# Patient Record
Sex: Male | Born: 1935 | Race: White | Hispanic: No | Marital: Married | Smoking: Former smoker
Health system: Southern US, Community
[De-identification: ages and names within clinical notes are randomized; demographics above are authoritative.]

## PROBLEM LIST (undated history)

## (undated) DIAGNOSIS — E785 Hyperlipidemia, unspecified: Secondary | ICD-10-CM

## (undated) DIAGNOSIS — L02215 Cutaneous abscess of perineum: Secondary | ICD-10-CM

## (undated) DIAGNOSIS — G4730 Sleep apnea, unspecified: Secondary | ICD-10-CM

## (undated) DIAGNOSIS — K2210 Ulcer of esophagus without bleeding: Secondary | ICD-10-CM

## (undated) DIAGNOSIS — J439 Emphysema, unspecified: Secondary | ICD-10-CM

## (undated) DIAGNOSIS — R7303 Prediabetes: Secondary | ICD-10-CM

## (undated) DIAGNOSIS — C4491 Basal cell carcinoma of skin, unspecified: Secondary | ICD-10-CM

## (undated) DIAGNOSIS — Z950 Presence of cardiac pacemaker: Secondary | ICD-10-CM

## (undated) DIAGNOSIS — I10 Essential (primary) hypertension: Secondary | ICD-10-CM

## (undated) DIAGNOSIS — T8859XA Other complications of anesthesia, initial encounter: Secondary | ICD-10-CM

## (undated) DIAGNOSIS — R739 Hyperglycemia, unspecified: Secondary | ICD-10-CM

## (undated) DIAGNOSIS — J189 Pneumonia, unspecified organism: Secondary | ICD-10-CM

## (undated) DIAGNOSIS — N189 Chronic kidney disease, unspecified: Secondary | ICD-10-CM

## (undated) DIAGNOSIS — I4891 Unspecified atrial fibrillation: Secondary | ICD-10-CM

## (undated) DIAGNOSIS — I4892 Unspecified atrial flutter: Secondary | ICD-10-CM

## (undated) DIAGNOSIS — I499 Cardiac arrhythmia, unspecified: Secondary | ICD-10-CM

## (undated) DIAGNOSIS — J449 Chronic obstructive pulmonary disease, unspecified: Secondary | ICD-10-CM

## (undated) DIAGNOSIS — M259 Joint disorder, unspecified: Secondary | ICD-10-CM

## (undated) DIAGNOSIS — F329 Major depressive disorder, single episode, unspecified: Secondary | ICD-10-CM

## (undated) DIAGNOSIS — K5790 Diverticulosis of intestine, part unspecified, without perforation or abscess without bleeding: Secondary | ICD-10-CM

## (undated) DIAGNOSIS — K222 Esophageal obstruction: Secondary | ICD-10-CM

## (undated) DIAGNOSIS — M1990 Unspecified osteoarthritis, unspecified site: Secondary | ICD-10-CM

## (undated) DIAGNOSIS — J45909 Unspecified asthma, uncomplicated: Secondary | ICD-10-CM

## (undated) DIAGNOSIS — K449 Diaphragmatic hernia without obstruction or gangrene: Secondary | ICD-10-CM

## (undated) DIAGNOSIS — K5730 Diverticulosis of large intestine without perforation or abscess without bleeding: Secondary | ICD-10-CM

## (undated) DIAGNOSIS — H3530 Unspecified macular degeneration: Secondary | ICD-10-CM

## (undated) DIAGNOSIS — E119 Type 2 diabetes mellitus without complications: Secondary | ICD-10-CM

## (undated) DIAGNOSIS — Z87442 Personal history of urinary calculi: Secondary | ICD-10-CM

## (undated) DIAGNOSIS — F32A Depression, unspecified: Secondary | ICD-10-CM

## (undated) HISTORY — DX: Major depressive disorder, single episode, unspecified: F32.9

## (undated) HISTORY — DX: Hyperglycemia, unspecified: R73.9

## (undated) HISTORY — DX: Unspecified macular degeneration: H35.30

## (undated) HISTORY — DX: Cutaneous abscess of perineum: L02.215

## (undated) HISTORY — DX: Type 2 diabetes mellitus without complications: E11.9

## (undated) HISTORY — PX: INSERT / REPLACE / REMOVE PACEMAKER: SUR710

## (undated) HISTORY — DX: Esophageal obstruction: K22.2

## (undated) HISTORY — DX: Pneumonia, unspecified organism: J18.9

## (undated) HISTORY — PX: NOSE SURGERY: SHX723

## (undated) HISTORY — DX: Emphysema, unspecified: J43.9

## (undated) HISTORY — DX: Ulcer of esophagus without bleeding: K22.10

## (undated) HISTORY — DX: Diaphragmatic hernia without obstruction or gangrene: K44.9

## (undated) HISTORY — DX: Chronic obstructive pulmonary disease, unspecified: J44.9

## (undated) HISTORY — DX: Unspecified atrial flutter: I48.92

## (undated) HISTORY — DX: Joint disorder, unspecified: M25.9

## (undated) HISTORY — DX: Depression, unspecified: F32.A

## (undated) HISTORY — DX: Diverticulosis of intestine, part unspecified, without perforation or abscess without bleeding: K57.90

## (undated) HISTORY — DX: Essential (primary) hypertension: I10

## (undated) HISTORY — DX: Basal cell carcinoma of skin, unspecified: C44.91

## (undated) HISTORY — DX: Unspecified asthma, uncomplicated: J45.909

## (undated) HISTORY — DX: Unspecified atrial fibrillation: I48.91

## (undated) HISTORY — PX: CARDIAC ELECTROPHYSIOLOGY STUDY AND ABLATION: SHX1294

## (undated) HISTORY — DX: Hyperlipidemia, unspecified: E78.5

## (undated) HISTORY — DX: Unspecified osteoarthritis, unspecified site: M19.90

## (undated) HISTORY — DX: Sleep apnea, unspecified: G47.30

## (undated) HISTORY — PX: CYST EXCISION PERINEAL: SHX6278

## (undated) HISTORY — DX: Diverticulosis of large intestine without perforation or abscess without bleeding: K57.30

---

## 1969-06-12 HISTORY — PX: VASECTOMY: SHX75

## 1979-06-13 HISTORY — PX: UVULOPALATOPHARYNGOPLASTY: SHX827

## 1995-05-13 ENCOUNTER — Encounter: Payer: Self-pay | Admitting: Family Medicine

## 1995-05-13 LAB — CONVERTED CEMR LAB: PSA: 1 ng/mL

## 1998-11-19 ENCOUNTER — Encounter: Payer: Self-pay | Admitting: Emergency Medicine

## 1998-11-19 ENCOUNTER — Emergency Department (HOSPITAL_COMMUNITY): Admission: EM | Admit: 1998-11-19 | Discharge: 1998-11-19 | Payer: Self-pay | Admitting: Emergency Medicine

## 1998-12-11 ENCOUNTER — Encounter: Payer: Self-pay | Admitting: Family Medicine

## 1998-12-11 LAB — CONVERTED CEMR LAB: PSA: 1.3 ng/mL

## 2000-10-27 ENCOUNTER — Encounter (INDEPENDENT_AMBULATORY_CARE_PROVIDER_SITE_OTHER): Payer: Self-pay | Admitting: *Deleted

## 2000-10-27 ENCOUNTER — Encounter: Payer: Self-pay | Admitting: Gastroenterology

## 2000-10-27 ENCOUNTER — Ambulatory Visit (HOSPITAL_COMMUNITY): Admission: RE | Admit: 2000-10-27 | Discharge: 2000-10-27 | Payer: Self-pay | Admitting: Internal Medicine

## 2000-12-10 LAB — HM COLONOSCOPY

## 2002-09-11 ENCOUNTER — Encounter: Payer: Self-pay | Admitting: Family Medicine

## 2002-09-11 LAB — CONVERTED CEMR LAB: PSA: 0.9 ng/mL

## 2004-08-12 ENCOUNTER — Encounter: Payer: Self-pay | Admitting: Family Medicine

## 2004-08-12 LAB — CONVERTED CEMR LAB
PSA: 0.91 ng/mL
PSA: 0.91 ng/mL

## 2004-08-18 ENCOUNTER — Ambulatory Visit: Payer: Self-pay

## 2004-08-21 ENCOUNTER — Ambulatory Visit: Payer: Self-pay | Admitting: Family Medicine

## 2004-08-26 ENCOUNTER — Ambulatory Visit: Payer: Self-pay | Admitting: Family Medicine

## 2004-09-10 ENCOUNTER — Ambulatory Visit: Payer: Self-pay | Admitting: Family Medicine

## 2004-09-15 ENCOUNTER — Ambulatory Visit: Payer: Self-pay | Admitting: Cardiology

## 2004-09-19 ENCOUNTER — Ambulatory Visit: Payer: Self-pay | Admitting: Family Medicine

## 2004-09-23 ENCOUNTER — Ambulatory Visit: Payer: Self-pay

## 2004-10-07 ENCOUNTER — Ambulatory Visit: Payer: Self-pay | Admitting: Family Medicine

## 2004-10-08 ENCOUNTER — Ambulatory Visit: Payer: Self-pay | Admitting: Internal Medicine

## 2004-10-10 ENCOUNTER — Inpatient Hospital Stay (HOSPITAL_COMMUNITY): Admission: EM | Admit: 2004-10-10 | Discharge: 2004-10-11 | Payer: Self-pay | Admitting: Emergency Medicine

## 2004-10-10 ENCOUNTER — Ambulatory Visit: Payer: Self-pay | Admitting: Cardiovascular Disease

## 2004-10-15 ENCOUNTER — Ambulatory Visit: Payer: Self-pay | Admitting: Cardiology

## 2004-10-22 ENCOUNTER — Ambulatory Visit: Payer: Self-pay | Admitting: Internal Medicine

## 2004-10-27 ENCOUNTER — Ambulatory Visit: Payer: Self-pay | Admitting: Internal Medicine

## 2004-10-28 ENCOUNTER — Ambulatory Visit (HOSPITAL_COMMUNITY): Admission: RE | Admit: 2004-10-28 | Discharge: 2004-10-28 | Payer: Self-pay | Admitting: Cardiology

## 2004-11-03 ENCOUNTER — Ambulatory Visit: Payer: Self-pay | Admitting: Internal Medicine

## 2004-11-05 ENCOUNTER — Ambulatory Visit: Payer: Self-pay | Admitting: Cardiology

## 2004-11-10 ENCOUNTER — Ambulatory Visit: Payer: Self-pay | Admitting: Internal Medicine

## 2004-11-10 ENCOUNTER — Ambulatory Visit: Payer: Self-pay

## 2004-11-14 ENCOUNTER — Ambulatory Visit: Payer: Self-pay | Admitting: Cardiology

## 2004-11-17 ENCOUNTER — Ambulatory Visit (HOSPITAL_COMMUNITY): Admission: RE | Admit: 2004-11-17 | Discharge: 2004-11-17 | Payer: Self-pay | Admitting: Cardiology

## 2004-11-17 ENCOUNTER — Ambulatory Visit: Payer: Self-pay | Admitting: Cardiology

## 2004-11-27 ENCOUNTER — Ambulatory Visit: Payer: Self-pay | Admitting: Cardiology

## 2004-12-05 ENCOUNTER — Ambulatory Visit: Payer: Self-pay | Admitting: Cardiology

## 2004-12-12 ENCOUNTER — Ambulatory Visit: Payer: Self-pay | Admitting: Cardiology

## 2004-12-18 ENCOUNTER — Ambulatory Visit: Payer: Self-pay | Admitting: *Deleted

## 2005-01-15 ENCOUNTER — Ambulatory Visit: Payer: Self-pay | Admitting: Internal Medicine

## 2005-02-12 ENCOUNTER — Ambulatory Visit: Payer: Self-pay | Admitting: Internal Medicine

## 2005-02-24 ENCOUNTER — Ambulatory Visit: Payer: Self-pay | Admitting: Cardiology

## 2005-03-02 ENCOUNTER — Ambulatory Visit: Payer: Self-pay | Admitting: *Deleted

## 2005-03-02 ENCOUNTER — Inpatient Hospital Stay (HOSPITAL_COMMUNITY): Admission: EM | Admit: 2005-03-02 | Discharge: 2005-03-05 | Payer: Self-pay | Admitting: Emergency Medicine

## 2005-03-12 ENCOUNTER — Ambulatory Visit: Payer: Self-pay | Admitting: Cardiology

## 2005-03-23 ENCOUNTER — Ambulatory Visit: Payer: Self-pay | Admitting: Cardiology

## 2005-04-06 ENCOUNTER — Ambulatory Visit: Payer: Self-pay | Admitting: Internal Medicine

## 2005-04-06 ENCOUNTER — Ambulatory Visit: Payer: Self-pay | Admitting: Cardiology

## 2005-04-23 ENCOUNTER — Ambulatory Visit: Payer: Self-pay | Admitting: Family Medicine

## 2005-04-27 ENCOUNTER — Ambulatory Visit: Payer: Self-pay | Admitting: Cardiology

## 2005-04-29 ENCOUNTER — Ambulatory Visit: Payer: Self-pay | Admitting: Cardiology

## 2005-05-04 ENCOUNTER — Ambulatory Visit: Payer: Self-pay | Admitting: Internal Medicine

## 2005-05-12 ENCOUNTER — Ambulatory Visit: Payer: Self-pay

## 2005-05-25 ENCOUNTER — Ambulatory Visit: Payer: Self-pay | Admitting: Internal Medicine

## 2005-06-22 ENCOUNTER — Ambulatory Visit: Payer: Self-pay | Admitting: Internal Medicine

## 2005-07-06 ENCOUNTER — Ambulatory Visit: Payer: Self-pay | Admitting: Internal Medicine

## 2005-07-10 ENCOUNTER — Inpatient Hospital Stay (HOSPITAL_COMMUNITY): Admission: AD | Admit: 2005-07-10 | Discharge: 2005-07-13 | Payer: Self-pay | Admitting: Cardiology

## 2005-07-12 ENCOUNTER — Ambulatory Visit: Payer: Self-pay | Admitting: Cardiovascular Disease

## 2005-07-20 ENCOUNTER — Ambulatory Visit: Payer: Self-pay | Admitting: Internal Medicine

## 2005-07-27 ENCOUNTER — Ambulatory Visit: Payer: Self-pay | Admitting: Internal Medicine

## 2005-07-27 ENCOUNTER — Ambulatory Visit: Payer: Self-pay | Admitting: Cardiology

## 2005-08-11 ENCOUNTER — Ambulatory Visit: Payer: Self-pay | Admitting: Cardiology

## 2005-09-01 ENCOUNTER — Emergency Department (HOSPITAL_COMMUNITY): Admission: EM | Admit: 2005-09-01 | Discharge: 2005-09-01 | Payer: Self-pay | Admitting: Emergency Medicine

## 2005-09-01 ENCOUNTER — Ambulatory Visit: Payer: Self-pay | Admitting: Internal Medicine

## 2005-09-04 ENCOUNTER — Ambulatory Visit: Payer: Self-pay | Admitting: Cardiology

## 2005-09-04 ENCOUNTER — Ambulatory Visit: Payer: Self-pay

## 2005-09-10 ENCOUNTER — Ambulatory Visit (HOSPITAL_COMMUNITY): Admission: RE | Admit: 2005-09-10 | Discharge: 2005-09-10 | Payer: Self-pay | Admitting: Cardiology

## 2005-09-11 ENCOUNTER — Encounter: Payer: Self-pay | Admitting: Family Medicine

## 2005-09-11 LAB — CONVERTED CEMR LAB
PSA: 1.71 ng/mL
PSA: 1.71 ng/mL

## 2005-09-15 ENCOUNTER — Ambulatory Visit: Payer: Self-pay | Admitting: Family Medicine

## 2005-09-29 ENCOUNTER — Ambulatory Visit: Payer: Self-pay | Admitting: Cardiology

## 2005-09-29 ENCOUNTER — Ambulatory Visit: Payer: Self-pay

## 2005-09-29 ENCOUNTER — Ambulatory Visit: Payer: Self-pay | Admitting: Cardiovascular Disease

## 2005-09-30 ENCOUNTER — Ambulatory Visit: Payer: Self-pay | Admitting: Family Medicine

## 2005-10-07 ENCOUNTER — Ambulatory Visit: Payer: Self-pay | Admitting: Cardiology

## 2005-10-08 ENCOUNTER — Ambulatory Visit: Payer: Self-pay | Admitting: *Deleted

## 2005-10-09 ENCOUNTER — Ambulatory Visit: Payer: Self-pay | Admitting: Family Medicine

## 2005-10-14 ENCOUNTER — Ambulatory Visit: Payer: Self-pay | Admitting: Internal Medicine

## 2005-10-22 ENCOUNTER — Ambulatory Visit (HOSPITAL_COMMUNITY): Admission: RE | Admit: 2005-10-22 | Discharge: 2005-10-22 | Payer: Self-pay | Admitting: Internal Medicine

## 2005-10-26 ENCOUNTER — Ambulatory Visit: Payer: Self-pay | Admitting: Family Medicine

## 2005-10-27 ENCOUNTER — Ambulatory Visit: Payer: Self-pay | Admitting: Cardiology

## 2005-11-17 ENCOUNTER — Ambulatory Visit: Payer: Self-pay | Admitting: Internal Medicine

## 2005-11-20 ENCOUNTER — Ambulatory Visit: Payer: Self-pay | Admitting: Internal Medicine

## 2005-11-22 ENCOUNTER — Ambulatory Visit: Payer: Self-pay | Admitting: Cardiology

## 2005-11-22 ENCOUNTER — Inpatient Hospital Stay (HOSPITAL_COMMUNITY): Admission: EM | Admit: 2005-11-22 | Discharge: 2005-11-25 | Payer: Self-pay | Admitting: Internal Medicine

## 2005-12-10 ENCOUNTER — Ambulatory Visit: Payer: Self-pay | Admitting: Family Medicine

## 2005-12-15 ENCOUNTER — Ambulatory Visit: Payer: Self-pay | Admitting: Cardiology

## 2005-12-24 ENCOUNTER — Ambulatory Visit: Payer: Self-pay | Admitting: Internal Medicine

## 2006-01-10 ENCOUNTER — Encounter: Payer: Self-pay | Admitting: Family Medicine

## 2006-01-10 LAB — CONVERTED CEMR LAB
PSA: 1.3 ng/mL
PSA: 1.3 ng/mL

## 2006-01-12 ENCOUNTER — Ambulatory Visit: Payer: Self-pay | Admitting: Cardiology

## 2006-02-01 ENCOUNTER — Ambulatory Visit: Payer: Self-pay | Admitting: Family Medicine

## 2006-02-09 ENCOUNTER — Ambulatory Visit: Payer: Self-pay | Admitting: Cardiology

## 2006-02-12 ENCOUNTER — Observation Stay (HOSPITAL_COMMUNITY): Admission: EM | Admit: 2006-02-12 | Discharge: 2006-02-13 | Payer: Self-pay | Admitting: *Deleted

## 2006-02-12 ENCOUNTER — Ambulatory Visit: Payer: Self-pay | Admitting: Internal Medicine

## 2006-02-23 ENCOUNTER — Ambulatory Visit: Payer: Self-pay | Admitting: Cardiology

## 2006-03-02 ENCOUNTER — Ambulatory Visit: Payer: Self-pay | Admitting: *Deleted

## 2006-03-10 ENCOUNTER — Ambulatory Visit: Payer: Self-pay | Admitting: Internal Medicine

## 2006-03-16 ENCOUNTER — Ambulatory Visit: Payer: Self-pay | Admitting: Internal Medicine

## 2006-03-18 ENCOUNTER — Ambulatory Visit: Payer: Self-pay

## 2006-03-23 ENCOUNTER — Ambulatory Visit: Payer: Self-pay | Admitting: Cardiology

## 2006-03-26 ENCOUNTER — Ambulatory Visit: Payer: Self-pay | Admitting: Cardiology

## 2006-03-26 ENCOUNTER — Ambulatory Visit (HOSPITAL_COMMUNITY): Admission: RE | Admit: 2006-03-26 | Discharge: 2006-03-26 | Payer: Self-pay | Admitting: Cardiology

## 2006-03-26 ENCOUNTER — Encounter: Payer: Self-pay | Admitting: Cardiology

## 2006-04-02 ENCOUNTER — Ambulatory Visit: Payer: Self-pay | Admitting: Cardiology

## 2006-04-02 ENCOUNTER — Ambulatory Visit: Payer: Self-pay | Admitting: Internal Medicine

## 2006-04-05 ENCOUNTER — Ambulatory Visit: Payer: Self-pay | Admitting: Cardiology

## 2006-04-05 ENCOUNTER — Ambulatory Visit: Payer: Self-pay | Admitting: Internal Medicine

## 2006-04-09 ENCOUNTER — Ambulatory Visit: Payer: Self-pay | Admitting: Cardiology

## 2006-04-19 ENCOUNTER — Ambulatory Visit: Payer: Self-pay | Admitting: Cardiology

## 2006-05-10 ENCOUNTER — Ambulatory Visit: Payer: Self-pay | Admitting: Cardiology

## 2006-05-24 ENCOUNTER — Ambulatory Visit: Payer: Self-pay | Admitting: Cardiovascular Disease

## 2006-05-25 ENCOUNTER — Ambulatory Visit: Payer: Self-pay | Admitting: Cardiology

## 2006-06-21 ENCOUNTER — Ambulatory Visit: Payer: Self-pay | Admitting: Cardiology

## 2006-07-19 ENCOUNTER — Ambulatory Visit: Payer: Self-pay | Admitting: Cardiovascular Disease

## 2006-08-12 ENCOUNTER — Encounter: Payer: Self-pay | Admitting: Family Medicine

## 2006-08-12 LAB — CONVERTED CEMR LAB
PSA: 1.09 ng/mL
PSA: 1.09 ng/mL

## 2006-08-16 ENCOUNTER — Ambulatory Visit: Payer: Self-pay | Admitting: Internal Medicine

## 2006-08-23 ENCOUNTER — Ambulatory Visit: Payer: Self-pay | Admitting: Family Medicine

## 2006-08-25 ENCOUNTER — Ambulatory Visit: Payer: Self-pay | Admitting: Family Medicine

## 2006-09-13 ENCOUNTER — Ambulatory Visit: Payer: Self-pay | Admitting: Cardiology

## 2006-09-16 ENCOUNTER — Ambulatory Visit: Payer: Self-pay | Admitting: Cardiology

## 2006-09-29 ENCOUNTER — Ambulatory Visit: Payer: Self-pay

## 2006-10-11 ENCOUNTER — Ambulatory Visit: Payer: Self-pay | Admitting: Cardiology

## 2006-10-12 ENCOUNTER — Encounter: Payer: Self-pay | Admitting: Family Medicine

## 2006-10-12 LAB — CONVERTED CEMR LAB
Microalbumin U total vol: 4.7 mg/L
PSA: 1.19 ng/mL

## 2006-10-19 ENCOUNTER — Ambulatory Visit: Payer: Self-pay | Admitting: Family Medicine

## 2006-10-21 ENCOUNTER — Ambulatory Visit: Payer: Self-pay | Admitting: Family Medicine

## 2006-10-29 ENCOUNTER — Ambulatory Visit: Payer: Self-pay | Admitting: Cardiology

## 2007-04-13 ENCOUNTER — Encounter: Payer: Self-pay | Admitting: Family Medicine

## 2007-04-13 ENCOUNTER — Ambulatory Visit: Payer: Self-pay | Admitting: Cardiology

## 2007-04-13 DIAGNOSIS — E785 Hyperlipidemia, unspecified: Secondary | ICD-10-CM | POA: Insufficient documentation

## 2007-04-13 DIAGNOSIS — I10 Essential (primary) hypertension: Secondary | ICD-10-CM | POA: Insufficient documentation

## 2007-04-13 DIAGNOSIS — K5730 Diverticulosis of large intestine without perforation or abscess without bleeding: Secondary | ICD-10-CM | POA: Insufficient documentation

## 2007-04-14 DIAGNOSIS — J309 Allergic rhinitis, unspecified: Secondary | ICD-10-CM | POA: Insufficient documentation

## 2007-04-19 ENCOUNTER — Ambulatory Visit: Payer: Self-pay | Admitting: Family Medicine

## 2007-04-19 LAB — CONVERTED CEMR LAB
Glucose, Bld: 115 mg/dL — ABNORMAL HIGH (ref 70–99)
Hgb A1c MFr Bld: 6.2 % — ABNORMAL HIGH (ref 4.6–6.0)

## 2007-04-21 ENCOUNTER — Encounter (INDEPENDENT_AMBULATORY_CARE_PROVIDER_SITE_OTHER): Payer: Self-pay | Admitting: *Deleted

## 2007-04-21 ENCOUNTER — Ambulatory Visit: Payer: Self-pay | Admitting: Family Medicine

## 2007-04-27 ENCOUNTER — Encounter: Payer: Self-pay | Admitting: Family Medicine

## 2007-07-05 ENCOUNTER — Telehealth: Payer: Self-pay | Admitting: Family Medicine

## 2007-09-01 ENCOUNTER — Encounter: Payer: Self-pay | Admitting: Family Medicine

## 2007-10-05 ENCOUNTER — Emergency Department (HOSPITAL_COMMUNITY): Admission: EM | Admit: 2007-10-05 | Discharge: 2007-10-05 | Payer: Self-pay | Admitting: Emergency Medicine

## 2007-10-10 ENCOUNTER — Ambulatory Visit: Payer: Self-pay | Admitting: Cardiology

## 2007-10-12 ENCOUNTER — Ambulatory Visit: Payer: Self-pay | Admitting: Cardiology

## 2007-10-19 ENCOUNTER — Ambulatory Visit: Payer: Self-pay | Admitting: Cardiovascular Disease

## 2007-10-21 ENCOUNTER — Ambulatory Visit: Payer: Self-pay | Admitting: Family Medicine

## 2007-10-21 LAB — CONVERTED CEMR LAB
ALT: 21 units/L (ref 0–53)
AST: 21 units/L (ref 0–37)
Albumin: 3.4 g/dL — ABNORMAL LOW (ref 3.5–5.2)
Alkaline Phosphatase: 39 units/L (ref 39–117)
BUN: 15 mg/dL (ref 6–23)
Basophils Absolute: 0 10*3/uL (ref 0.0–0.1)
Basophils Relative: 0.5 % (ref 0.0–1.0)
Bilirubin, Direct: 0.1 mg/dL (ref 0.0–0.3)
CO2: 31 meq/L (ref 19–32)
Calcium: 8.7 mg/dL (ref 8.4–10.5)
Chloride: 102 meq/L (ref 96–112)
Cholesterol: 131 mg/dL (ref 0–200)
Creatinine, Ser: 1.1 mg/dL (ref 0.4–1.5)
Creatinine,U: 153.5 mg/dL
Eosinophils Absolute: 0.3 10*3/uL (ref 0.0–0.6)
Eosinophils Relative: 6 % — ABNORMAL HIGH (ref 0.0–5.0)
GFR calc Af Amer: 85 mL/min
GFR calc non Af Amer: 70 mL/min
Glucose, Bld: 117 mg/dL — ABNORMAL HIGH (ref 70–99)
HCT: 43.1 % (ref 39.0–52.0)
HDL: 42.3 mg/dL (ref >39.0)
Hemoglobin: 14.9 g/dL (ref 13.0–17.0)
Hgb A1c MFr Bld: 6.2 % — ABNORMAL HIGH (ref 4.6–6.0)
LDL Cholesterol: 78 mg/dL (ref 0–99)
Lymphocytes Relative: 25.1 % (ref 12.0–46.0)
MCHC: 34.6 g/dL (ref 30.0–36.0)
MCV: 88.7 fL (ref 78.0–100.0)
Microalb Creat Ratio: 12.4 mg/g (ref 0.0–30.0)
Microalb, Ur: 1.9 mg/dL (ref 0.0–1.9)
Monocytes Absolute: 0.4 10*3/uL (ref 0.2–0.7)
Monocytes Relative: 10.3 % (ref 3.0–11.0)
Neutro Abs: 2.5 10*3/uL (ref 1.4–7.7)
Neutrophils Relative %: 58.1 % (ref 43.0–77.0)
PSA: 1.38 ng/mL (ref 0.10–4.00)
Platelets: 148 10*3/uL — ABNORMAL LOW (ref 150–400)
Potassium: 3.9 meq/L (ref 3.5–5.1)
RBC: 4.86 M/uL (ref 4.22–5.81)
RDW: 13.2 % (ref 11.5–14.6)
Sodium: 139 meq/L (ref 135–145)
TSH: 1.77 microintl units/mL (ref 0.35–5.50)
Total Bilirubin: 1 mg/dL (ref 0.3–1.2)
Total CHOL/HDL Ratio: 3.1
Total Protein: 6.4 g/dL (ref 6.0–8.3)
Triglycerides: 52 mg/dL (ref 0–149)
VLDL: 10 mg/dL (ref 0–40)
WBC: 4.3 10*3/uL — ABNORMAL LOW (ref 4.5–10.5)

## 2007-10-25 ENCOUNTER — Ambulatory Visit: Payer: Self-pay | Admitting: Cardiovascular Disease

## 2007-10-25 ENCOUNTER — Ambulatory Visit: Payer: Self-pay | Admitting: Family Medicine

## 2007-10-26 ENCOUNTER — Encounter: Payer: Self-pay | Admitting: Family Medicine

## 2007-10-31 ENCOUNTER — Ambulatory Visit: Payer: Self-pay | Admitting: Cardiology

## 2007-11-02 ENCOUNTER — Ambulatory Visit: Payer: Self-pay | Admitting: Cardiology

## 2007-11-07 ENCOUNTER — Ambulatory Visit: Payer: Self-pay | Admitting: Cardiology

## 2007-11-07 LAB — CONVERTED CEMR LAB
BUN: 16 mg/dL (ref 6–23)
Basophils Absolute: 0 10*3/uL (ref 0.0–0.1)
Basophils Relative: 1.3 % — ABNORMAL HIGH (ref 0.0–1.0)
CO2: 32 meq/L (ref 19–32)
Calcium: 8.9 mg/dL (ref 8.4–10.5)
Chloride: 103 meq/L (ref 96–112)
Creatinine, Ser: 1.1 mg/dL (ref 0.4–1.5)
Eosinophils Absolute: 0.2 10*3/uL (ref 0.0–0.6)
Eosinophils Relative: 5.3 % — ABNORMAL HIGH (ref 0.0–5.0)
GFR calc Af Amer: 85 mL/min
GFR calc non Af Amer: 70 mL/min
Glucose, Bld: 88 mg/dL (ref 70–99)
HCT: 43.1 % (ref 39.0–52.0)
Hemoglobin: 14.3 g/dL (ref 13.0–17.0)
INR: 2.6 — ABNORMAL HIGH (ref 0.8–1.0)
Lymphocytes Relative: 21.2 % (ref 12.0–46.0)
MCHC: 33.3 g/dL (ref 30.0–36.0)
MCV: 88.7 fL (ref 78.0–100.0)
Monocytes Absolute: 0.5 10*3/uL (ref 0.2–0.7)
Monocytes Relative: 10.8 % (ref 3.0–11.0)
Neutro Abs: 2.8 10*3/uL (ref 1.4–7.7)
Neutrophils Relative %: 61.4 % (ref 43.0–77.0)
Platelets: 138 10*3/uL — ABNORMAL LOW (ref 150–400)
Potassium: 4.6 meq/L (ref 3.5–5.1)
Prothrombin Time: 20.4 s — ABNORMAL HIGH (ref 10.9–13.3)
RBC: 4.85 M/uL (ref 4.22–5.81)
RDW: 13.2 % (ref 11.5–14.6)
Sodium: 141 meq/L (ref 135–145)
WBC: 4.6 10*3/uL (ref 4.5–10.5)
aPTT: 38.3 s — ABNORMAL HIGH (ref 21.7–29.8)

## 2007-11-11 ENCOUNTER — Ambulatory Visit: Payer: Self-pay | Admitting: Cardiology

## 2007-11-11 ENCOUNTER — Ambulatory Visit (HOSPITAL_COMMUNITY): Admission: RE | Admit: 2007-11-11 | Discharge: 2007-11-11 | Payer: Self-pay | Admitting: Cardiology

## 2007-11-17 ENCOUNTER — Ambulatory Visit: Payer: Self-pay | Admitting: Cardiology

## 2007-11-23 ENCOUNTER — Encounter: Admission: RE | Admit: 2007-11-23 | Discharge: 2007-11-23 | Payer: Self-pay | Admitting: Family Medicine

## 2007-11-23 ENCOUNTER — Telehealth: Payer: Self-pay | Admitting: Family Medicine

## 2007-11-23 ENCOUNTER — Ambulatory Visit: Payer: Self-pay | Admitting: Family Medicine

## 2007-11-24 ENCOUNTER — Ambulatory Visit: Payer: Self-pay | Admitting: Cardiology

## 2007-11-28 ENCOUNTER — Ambulatory Visit: Payer: Self-pay | Admitting: Family Medicine

## 2007-11-28 DIAGNOSIS — T503X4A Poisoning by electrolytic, caloric and water-balance agents, undetermined, initial encounter: Secondary | ICD-10-CM | POA: Insufficient documentation

## 2007-12-01 ENCOUNTER — Ambulatory Visit: Payer: Self-pay | Admitting: Cardiology

## 2007-12-08 ENCOUNTER — Ambulatory Visit: Payer: Self-pay | Admitting: Cardiology

## 2007-12-14 ENCOUNTER — Encounter: Payer: Self-pay | Admitting: Family Medicine

## 2007-12-15 ENCOUNTER — Encounter: Payer: Self-pay | Admitting: Family Medicine

## 2007-12-19 ENCOUNTER — Ambulatory Visit: Payer: Self-pay | Admitting: Cardiology

## 2007-12-19 LAB — CONVERTED CEMR LAB
INR: 1.9 — ABNORMAL HIGH (ref 0.8–1.0)
Prothrombin Time: 17 s — ABNORMAL HIGH (ref 10.9–13.3)

## 2007-12-20 ENCOUNTER — Emergency Department (HOSPITAL_COMMUNITY): Admission: EM | Admit: 2007-12-20 | Discharge: 2007-12-20 | Payer: Self-pay | Admitting: Emergency Medicine

## 2007-12-21 ENCOUNTER — Ambulatory Visit: Payer: Self-pay | Admitting: Cardiology

## 2007-12-21 LAB — CONVERTED CEMR LAB
INR: 2.5 — ABNORMAL HIGH (ref 0.8–1.0)
Prothrombin Time: 19.6 s — ABNORMAL HIGH (ref 10.9–13.3)

## 2007-12-30 ENCOUNTER — Ambulatory Visit: Payer: Self-pay | Admitting: Cardiovascular Disease

## 2008-01-13 ENCOUNTER — Ambulatory Visit: Payer: Self-pay | Admitting: Cardiology

## 2008-02-03 ENCOUNTER — Ambulatory Visit: Payer: Self-pay | Admitting: Cardiology

## 2008-02-13 ENCOUNTER — Encounter: Payer: Self-pay | Admitting: Family Medicine

## 2008-03-01 ENCOUNTER — Ambulatory Visit: Payer: Self-pay | Admitting: Internal Medicine

## 2008-03-29 ENCOUNTER — Ambulatory Visit: Payer: Self-pay | Admitting: Cardiology

## 2008-04-23 ENCOUNTER — Telehealth (INDEPENDENT_AMBULATORY_CARE_PROVIDER_SITE_OTHER): Payer: Self-pay | Admitting: *Deleted

## 2008-04-26 ENCOUNTER — Ambulatory Visit: Payer: Self-pay | Admitting: Cardiology

## 2008-05-16 ENCOUNTER — Encounter: Payer: Self-pay | Admitting: Family Medicine

## 2008-05-17 ENCOUNTER — Encounter: Payer: Self-pay | Admitting: Family Medicine

## 2008-05-28 ENCOUNTER — Ambulatory Visit: Payer: Self-pay | Admitting: Internal Medicine

## 2008-06-11 ENCOUNTER — Ambulatory Visit: Payer: Self-pay | Admitting: Cardiology

## 2008-07-09 ENCOUNTER — Ambulatory Visit: Payer: Self-pay | Admitting: Cardiovascular Disease

## 2008-07-10 ENCOUNTER — Ambulatory Visit: Payer: Self-pay | Admitting: Family Medicine

## 2008-08-06 ENCOUNTER — Ambulatory Visit: Payer: Self-pay | Admitting: Cardiology

## 2008-08-22 ENCOUNTER — Encounter: Payer: Self-pay | Admitting: Family Medicine

## 2008-08-23 ENCOUNTER — Telehealth: Payer: Self-pay | Admitting: Family Medicine

## 2008-08-27 ENCOUNTER — Ambulatory Visit: Payer: Self-pay | Admitting: Cardiology

## 2008-09-04 ENCOUNTER — Encounter: Payer: Self-pay | Admitting: Family Medicine

## 2008-09-10 ENCOUNTER — Encounter (INDEPENDENT_AMBULATORY_CARE_PROVIDER_SITE_OTHER): Payer: Self-pay | Admitting: *Deleted

## 2008-09-24 ENCOUNTER — Ambulatory Visit: Payer: Self-pay | Admitting: Cardiology

## 2008-10-08 ENCOUNTER — Ambulatory Visit: Payer: Self-pay | Admitting: Cardiology

## 2008-10-30 ENCOUNTER — Ambulatory Visit: Payer: Self-pay | Admitting: Cardiology

## 2008-11-13 ENCOUNTER — Ambulatory Visit: Payer: Self-pay | Admitting: Cardiology

## 2008-11-14 ENCOUNTER — Ambulatory Visit: Payer: Self-pay | Admitting: Cardiology

## 2008-11-20 ENCOUNTER — Ambulatory Visit: Payer: Self-pay | Admitting: Cardiology

## 2008-11-20 LAB — CONVERTED CEMR LAB
BUN: 14 mg/dL (ref 6–23)
CO2: 32 meq/L (ref 19–32)
Calcium: 9.1 mg/dL (ref 8.4–10.5)
Chloride: 101 meq/L (ref 96–112)
Creatinine, Ser: 1 mg/dL (ref 0.4–1.5)
GFR calc Af Amer: 94 mL/min
GFR calc non Af Amer: 78 mL/min
Glucose, Bld: 75 mg/dL (ref 70–99)
INR: 2.5 — ABNORMAL HIGH (ref 0.8–1.0)
Potassium: 4.2 meq/L (ref 3.5–5.1)
Prothrombin Time: 26 s — ABNORMAL HIGH (ref 10.9–13.3)
Sodium: 138 meq/L (ref 135–145)

## 2008-11-21 ENCOUNTER — Encounter: Payer: Self-pay | Admitting: Family Medicine

## 2008-11-27 ENCOUNTER — Ambulatory Visit: Payer: Self-pay | Admitting: Cardiology

## 2008-12-11 ENCOUNTER — Ambulatory Visit: Payer: Self-pay | Admitting: Internal Medicine

## 2008-12-25 ENCOUNTER — Ambulatory Visit: Payer: Self-pay | Admitting: Cardiology

## 2009-01-02 ENCOUNTER — Ambulatory Visit: Payer: Self-pay | Admitting: Cardiology

## 2009-01-02 ENCOUNTER — Encounter: Payer: Self-pay | Admitting: Cardiology

## 2009-01-03 ENCOUNTER — Ambulatory Visit: Payer: Self-pay | Admitting: Cardiology

## 2009-01-03 ENCOUNTER — Encounter: Payer: Self-pay | Admitting: Family Medicine

## 2009-01-03 LAB — CONVERTED CEMR LAB
BUN: 18 mg/dL (ref 6–23)
Basophils Absolute: 0.1 10*3/uL (ref 0.0–0.1)
Basophils Relative: 1.7 % (ref 0.0–3.0)
CK-MB: 3 ng/mL (ref 0.3–4.0)
CO2: 31 meq/L (ref 19–32)
Calcium: 9 mg/dL (ref 8.4–10.5)
Chloride: 103 meq/L (ref 96–112)
Creatinine, Ser: 0.8 mg/dL (ref 0.4–1.5)
Eosinophils Absolute: 0.2 10*3/uL (ref 0.0–0.7)
Eosinophils Relative: 4.5 % (ref 0.0–5.0)
GFR calc non Af Amer: 100.69 mL/min (ref >60)
Glucose, Bld: 102 mg/dL — ABNORMAL HIGH (ref 70–99)
HCT: 44.4 % (ref 39.0–52.0)
Hemoglobin: 14.9 g/dL (ref 13.0–17.0)
INR: 3 — ABNORMAL HIGH (ref 0.8–1.0)
Lymphocytes Relative: 21.4 % (ref 12.0–46.0)
Lymphs Abs: 0.9 10*3/uL (ref 0.7–4.0)
MCHC: 33.5 g/dL (ref 30.0–36.0)
MCV: 91.2 fL (ref 78.0–100.0)
Magnesium: 1.9 mg/dL (ref 1.5–2.5)
Monocytes Absolute: 0.4 10*3/uL (ref 0.1–1.0)
Monocytes Relative: 10.3 % (ref 3.0–12.0)
Neutro Abs: 2.5 10*3/uL (ref 1.4–7.7)
Neutrophils Relative %: 62.1 % (ref 43.0–77.0)
Platelets: 142 10*3/uL — ABNORMAL LOW (ref 150.0–400.0)
Potassium: 4.5 meq/L (ref 3.5–5.1)
Prothrombin Time: 30.7 s — ABNORMAL HIGH (ref 10.9–13.3)
RBC: 4.88 M/uL (ref 4.22–5.81)
RDW: 13.6 % (ref 11.5–14.6)
Relative Index: 2.1 (ref 0.0–2.5)
Sodium: 139 meq/L (ref 135–145)
Total CK: 145 units/L (ref 7–232)
WBC: 4.1 10*3/uL — ABNORMAL LOW (ref 4.5–10.5)
aPTT: 49.7 s — ABNORMAL HIGH (ref 21.7–28.8)

## 2009-01-08 ENCOUNTER — Telehealth: Payer: Self-pay | Admitting: Family Medicine

## 2009-01-16 ENCOUNTER — Ambulatory Visit: Payer: Self-pay | Admitting: Internal Medicine

## 2009-01-21 ENCOUNTER — Telehealth (INDEPENDENT_AMBULATORY_CARE_PROVIDER_SITE_OTHER): Payer: Self-pay | Admitting: *Deleted

## 2009-01-24 ENCOUNTER — Ambulatory Visit: Payer: Self-pay | Admitting: Cardiology

## 2009-01-24 ENCOUNTER — Ambulatory Visit: Payer: Self-pay | Admitting: Internal Medicine

## 2009-02-04 ENCOUNTER — Ambulatory Visit: Payer: Self-pay | Admitting: Cardiology

## 2009-02-06 ENCOUNTER — Encounter: Payer: Self-pay | Admitting: Family Medicine

## 2009-02-06 ENCOUNTER — Encounter: Payer: Self-pay | Admitting: Cardiology

## 2009-02-11 ENCOUNTER — Ambulatory Visit: Payer: Self-pay | Admitting: Cardiology

## 2009-02-13 ENCOUNTER — Encounter: Payer: Self-pay | Admitting: Cardiology

## 2009-02-13 ENCOUNTER — Encounter: Payer: Self-pay | Admitting: Family Medicine

## 2009-02-14 ENCOUNTER — Encounter: Payer: Self-pay | Admitting: Cardiology

## 2009-02-14 ENCOUNTER — Encounter: Payer: Self-pay | Admitting: Family Medicine

## 2009-02-14 ENCOUNTER — Encounter (INDEPENDENT_AMBULATORY_CARE_PROVIDER_SITE_OTHER): Payer: Self-pay | Admitting: *Deleted

## 2009-02-19 ENCOUNTER — Ambulatory Visit: Payer: Self-pay | Admitting: Cardiology

## 2009-02-27 ENCOUNTER — Telehealth: Payer: Self-pay | Admitting: Family Medicine

## 2009-02-28 ENCOUNTER — Ambulatory Visit: Payer: Self-pay | Admitting: Family Medicine

## 2009-02-28 LAB — CONVERTED CEMR LAB
Cholesterol: 135 mg/dL (ref 0–200)
Creatinine,U: 138.3 mg/dL
HDL: 53.2 mg/dL (ref >39.00)
LDL Cholesterol: 74 mg/dL (ref 0–99)
Microalb Creat Ratio: 7.2 mg/g (ref 0.0–30.0)
Microalb, Ur: 1 mg/dL (ref 0.0–1.9)
PSA: 1.06 ng/mL (ref 0.10–4.00)
TSH: 1.16 microintl units/mL (ref 0.35–5.50)
Total CHOL/HDL Ratio: 3
Triglycerides: 41 mg/dL (ref 0.0–149.0)
VLDL: 8.2 mg/dL (ref 0.0–40.0)

## 2009-03-04 ENCOUNTER — Ambulatory Visit: Payer: Self-pay | Admitting: Family Medicine

## 2009-03-05 ENCOUNTER — Ambulatory Visit: Payer: Self-pay | Admitting: Cardiology

## 2009-03-12 ENCOUNTER — Encounter: Payer: Self-pay | Admitting: *Deleted

## 2009-03-19 ENCOUNTER — Ambulatory Visit: Payer: Self-pay | Admitting: Family Medicine

## 2009-03-26 ENCOUNTER — Ambulatory Visit: Payer: Self-pay | Admitting: Internal Medicine

## 2009-03-26 ENCOUNTER — Encounter (INDEPENDENT_AMBULATORY_CARE_PROVIDER_SITE_OTHER): Payer: Self-pay | Admitting: Cardiology

## 2009-03-26 LAB — CONVERTED CEMR LAB
POC INR: 2
Protime: 17.5

## 2009-04-02 ENCOUNTER — Ambulatory Visit: Payer: Self-pay | Admitting: Cardiology

## 2009-04-02 ENCOUNTER — Encounter (INDEPENDENT_AMBULATORY_CARE_PROVIDER_SITE_OTHER): Payer: Self-pay | Admitting: Pharmacist

## 2009-04-02 LAB — CONVERTED CEMR LAB
POC INR: 2.3
Prothrombin Time: 18.7 s

## 2009-04-12 ENCOUNTER — Telehealth: Payer: Self-pay | Admitting: Family Medicine

## 2009-04-17 ENCOUNTER — Ambulatory Visit: Payer: Self-pay | Admitting: Family Medicine

## 2009-04-17 ENCOUNTER — Encounter: Payer: Self-pay | Admitting: *Deleted

## 2009-04-17 ENCOUNTER — Encounter: Payer: Self-pay | Admitting: Cardiology

## 2009-04-18 ENCOUNTER — Encounter: Payer: Self-pay | Admitting: Cardiology

## 2009-04-18 ENCOUNTER — Telehealth: Payer: Self-pay | Admitting: Cardiology

## 2009-04-19 DIAGNOSIS — I4891 Unspecified atrial fibrillation: Secondary | ICD-10-CM | POA: Insufficient documentation

## 2009-05-29 ENCOUNTER — Encounter (INDEPENDENT_AMBULATORY_CARE_PROVIDER_SITE_OTHER): Payer: Self-pay | Admitting: *Deleted

## 2009-06-10 ENCOUNTER — Encounter: Payer: Self-pay | Admitting: Cardiovascular Disease

## 2009-06-19 ENCOUNTER — Encounter: Payer: Self-pay | Admitting: Family Medicine

## 2009-06-19 ENCOUNTER — Telehealth: Payer: Self-pay | Admitting: Family Medicine

## 2009-06-25 ENCOUNTER — Encounter: Payer: Self-pay | Admitting: Cardiology

## 2009-06-26 ENCOUNTER — Telehealth: Payer: Self-pay | Admitting: Cardiology

## 2009-06-26 ENCOUNTER — Encounter: Payer: Self-pay | Admitting: Cardiology

## 2009-06-26 ENCOUNTER — Encounter: Payer: Self-pay | Admitting: Family Medicine

## 2009-06-26 ENCOUNTER — Encounter (INDEPENDENT_AMBULATORY_CARE_PROVIDER_SITE_OTHER): Payer: Self-pay | Admitting: *Deleted

## 2009-06-26 LAB — CONVERTED CEMR LAB
BUN: 48 mg/dL
CO2: 27 meq/L
Calcium: 8.8 mg/dL
Chloride: 102 meq/L
Creatinine, Ser: 1 mg/dL
HCT: 44 %
Hemoglobin: 14.5 g/dL
Magnesium: 2 mg/dL
Platelets: 135 10*3/uL
Potassium: 3.7 meq/L
Sodium: 138 meq/L
WBC: 3.5 10*3/uL

## 2009-06-26 LAB — HM DIABETES EYE EXAM: HM Diabetic Eye Exam: NORMAL

## 2009-07-01 ENCOUNTER — Encounter (INDEPENDENT_AMBULATORY_CARE_PROVIDER_SITE_OTHER): Payer: Self-pay | Admitting: *Deleted

## 2009-07-04 ENCOUNTER — Ambulatory Visit: Payer: Self-pay | Admitting: Pulmonary Disease

## 2009-07-04 DIAGNOSIS — G4733 Obstructive sleep apnea (adult) (pediatric): Secondary | ICD-10-CM | POA: Insufficient documentation

## 2009-07-17 ENCOUNTER — Encounter: Payer: Self-pay | Admitting: Cardiology

## 2009-07-17 ENCOUNTER — Encounter: Payer: Self-pay | Admitting: Family Medicine

## 2009-07-24 ENCOUNTER — Encounter: Payer: Self-pay | Admitting: Pulmonary Disease

## 2009-07-24 ENCOUNTER — Ambulatory Visit (HOSPITAL_BASED_OUTPATIENT_CLINIC_OR_DEPARTMENT_OTHER): Admission: RE | Admit: 2009-07-24 | Discharge: 2009-07-24 | Payer: Self-pay | Admitting: Pulmonary Disease

## 2009-08-06 ENCOUNTER — Ambulatory Visit: Payer: Self-pay | Admitting: Pulmonary Disease

## 2009-08-06 ENCOUNTER — Telehealth: Payer: Self-pay | Admitting: Pulmonary Disease

## 2009-08-07 ENCOUNTER — Telehealth (INDEPENDENT_AMBULATORY_CARE_PROVIDER_SITE_OTHER): Payer: Self-pay | Admitting: *Deleted

## 2009-08-07 ENCOUNTER — Ambulatory Visit: Payer: Self-pay | Admitting: Pulmonary Disease

## 2009-08-12 ENCOUNTER — Ambulatory Visit: Payer: Self-pay | Admitting: Internal Medicine

## 2009-08-12 LAB — CONVERTED CEMR LAB: POC INR: 1.6

## 2009-08-19 ENCOUNTER — Ambulatory Visit: Payer: Self-pay | Admitting: Internal Medicine

## 2009-08-19 LAB — CONVERTED CEMR LAB: POC INR: 2.4

## 2009-08-28 ENCOUNTER — Encounter: Payer: Self-pay | Admitting: Cardiology

## 2009-08-28 ENCOUNTER — Telehealth (INDEPENDENT_AMBULATORY_CARE_PROVIDER_SITE_OTHER): Payer: Self-pay | Admitting: *Deleted

## 2009-08-30 ENCOUNTER — Ambulatory Visit: Payer: Self-pay | Admitting: Cardiology

## 2009-08-30 ENCOUNTER — Encounter: Payer: Self-pay | Admitting: Cardiology

## 2009-08-30 ENCOUNTER — Telehealth: Payer: Self-pay | Admitting: Cardiology

## 2009-09-02 ENCOUNTER — Ambulatory Visit: Payer: Self-pay | Admitting: Family Medicine

## 2009-09-02 LAB — CONVERTED CEMR LAB: Hgb A1c MFr Bld: 6.3 % (ref 4.6–6.5)

## 2009-09-04 ENCOUNTER — Ambulatory Visit: Payer: Self-pay | Admitting: Internal Medicine

## 2009-09-04 LAB — CONVERTED CEMR LAB: POC INR: 2.7

## 2009-09-09 ENCOUNTER — Ambulatory Visit: Payer: Self-pay | Admitting: Family Medicine

## 2009-09-11 ENCOUNTER — Ambulatory Visit: Payer: Self-pay | Admitting: Pulmonary Disease

## 2009-09-18 ENCOUNTER — Encounter: Payer: Self-pay | Admitting: Cardiology

## 2009-09-18 ENCOUNTER — Ambulatory Visit: Payer: Self-pay | Admitting: Family Medicine

## 2009-09-18 DIAGNOSIS — R21 Rash and other nonspecific skin eruption: Secondary | ICD-10-CM | POA: Insufficient documentation

## 2009-09-19 ENCOUNTER — Encounter: Payer: Self-pay | Admitting: Family Medicine

## 2009-09-23 ENCOUNTER — Encounter: Payer: Self-pay | Admitting: Cardiology

## 2009-09-25 ENCOUNTER — Telehealth (INDEPENDENT_AMBULATORY_CARE_PROVIDER_SITE_OTHER): Payer: Self-pay | Admitting: *Deleted

## 2009-09-25 ENCOUNTER — Ambulatory Visit: Payer: Self-pay | Admitting: Cardiology

## 2009-09-25 LAB — CONVERTED CEMR LAB: POC INR: 2.2

## 2009-09-30 ENCOUNTER — Ambulatory Visit: Payer: Self-pay | Admitting: Pulmonary Disease

## 2009-10-02 ENCOUNTER — Encounter: Payer: Self-pay | Admitting: Pulmonary Disease

## 2009-10-07 ENCOUNTER — Encounter: Payer: Self-pay | Admitting: Pulmonary Disease

## 2009-10-16 ENCOUNTER — Ambulatory Visit: Payer: Self-pay | Admitting: Cardiovascular Disease

## 2009-10-16 LAB — CONVERTED CEMR LAB: POC INR: 2.4

## 2009-10-23 ENCOUNTER — Encounter: Payer: Self-pay | Admitting: Family Medicine

## 2009-10-23 ENCOUNTER — Encounter: Payer: Self-pay | Admitting: Cardiology

## 2009-10-29 ENCOUNTER — Telehealth: Payer: Self-pay | Admitting: Cardiology

## 2009-10-31 ENCOUNTER — Encounter: Payer: Self-pay | Admitting: Pulmonary Disease

## 2009-11-13 ENCOUNTER — Ambulatory Visit: Payer: Self-pay | Admitting: Cardiovascular Disease

## 2009-11-13 LAB — CONVERTED CEMR LAB: POC INR: 2.4

## 2009-12-11 ENCOUNTER — Ambulatory Visit: Payer: Self-pay | Admitting: Cardiology

## 2009-12-11 LAB — CONVERTED CEMR LAB: POC INR: 2.5

## 2010-01-07 ENCOUNTER — Ambulatory Visit: Payer: Self-pay | Admitting: Internal Medicine

## 2010-02-05 ENCOUNTER — Ambulatory Visit: Payer: Self-pay | Admitting: Internal Medicine

## 2010-02-26 ENCOUNTER — Ambulatory Visit: Payer: Self-pay | Admitting: Family Medicine

## 2010-02-27 LAB — CONVERTED CEMR LAB
Albumin: 3.9 g/dL (ref 3.5–5.2)
BUN: 17 mg/dL (ref 6–23)
CO2: 31 meq/L (ref 19–32)
Calcium: 8.9 mg/dL (ref 8.4–10.5)
Cholesterol: 134 mg/dL (ref 0–200)
Creatinine, Ser: 1 mg/dL (ref 0.4–1.5)
Eosinophils Absolute: 0.2 10*3/uL (ref 0.0–0.7)
Eosinophils Relative: 6.7 % — ABNORMAL HIGH (ref 0.0–5.0)
GFR calc non Af Amer: 78.49 mL/min (ref >60)
Glucose, Bld: 117 mg/dL — ABNORMAL HIGH (ref 70–99)
HCT: 41.1 % (ref 39.0–52.0)
HDL: 47.8 mg/dL (ref >39.00)
Hgb A1c MFr Bld: 6.3 % (ref 4.6–6.5)
Lymphs Abs: 0.8 10*3/uL (ref 0.7–4.0)
MCHC: 34.1 g/dL (ref 30.0–36.0)
MCV: 88.9 fL (ref 78.0–100.0)
Microalb Creat Ratio: 0.4 mg/g (ref 0.0–30.0)
Monocytes Absolute: 0.3 10*3/uL (ref 0.1–1.0)
Platelets: 142 10*3/uL — ABNORMAL LOW (ref 150.0–400.0)
RDW: 15.9 % — ABNORMAL HIGH (ref 11.5–14.6)
TSH: 1.77 microintl units/mL (ref 0.35–5.50)
Total Protein: 6.5 g/dL (ref 6.0–8.3)
Triglycerides: 29 mg/dL (ref 0.0–149.0)
VLDL: 5.8 mg/dL (ref 0.0–40.0)
WBC: 3.5 10*3/uL — ABNORMAL LOW (ref 4.5–10.5)

## 2010-03-05 ENCOUNTER — Ambulatory Visit: Payer: Self-pay | Admitting: Internal Medicine

## 2010-03-06 ENCOUNTER — Ambulatory Visit: Payer: Self-pay | Admitting: Family Medicine

## 2010-03-06 LAB — HM DIABETES FOOT EXAM

## 2010-03-12 ENCOUNTER — Encounter: Payer: Self-pay | Admitting: Cardiology

## 2010-03-25 ENCOUNTER — Ambulatory Visit: Payer: Self-pay | Admitting: Cardiovascular Disease

## 2010-03-25 ENCOUNTER — Ambulatory Visit: Payer: Self-pay | Admitting: Cardiology

## 2010-03-25 LAB — CONVERTED CEMR LAB: POC INR: 2.2

## 2010-03-27 ENCOUNTER — Telehealth: Payer: Self-pay | Admitting: Cardiology

## 2010-03-28 ENCOUNTER — Telehealth: Payer: Self-pay | Admitting: Cardiology

## 2010-03-31 ENCOUNTER — Ambulatory Visit: Payer: Self-pay | Admitting: Cardiology

## 2010-03-31 LAB — CONVERTED CEMR LAB: POC INR: 1.3

## 2010-04-07 ENCOUNTER — Ambulatory Visit: Payer: Self-pay | Admitting: Cardiology

## 2010-04-07 ENCOUNTER — Telehealth: Payer: Self-pay | Admitting: Cardiology

## 2010-04-10 LAB — CONVERTED CEMR LAB
Eosinophils Relative: 6.3 % — ABNORMAL HIGH (ref 0.0–5.0)
Monocytes Relative: 11.3 % (ref 3.0–12.0)
Neutrophils Relative %: 57.3 % (ref 43.0–77.0)
Platelets: 134 10*3/uL — ABNORMAL LOW (ref 150.0–400.0)
RBC: 4.38 M/uL (ref 4.22–5.81)
WBC: 3.5 10*3/uL — ABNORMAL LOW (ref 4.5–10.5)

## 2010-05-20 ENCOUNTER — Encounter (INDEPENDENT_AMBULATORY_CARE_PROVIDER_SITE_OTHER): Payer: Self-pay | Admitting: *Deleted

## 2010-05-30 ENCOUNTER — Ambulatory Visit: Payer: Self-pay | Admitting: Internal Medicine

## 2010-06-06 ENCOUNTER — Telehealth: Payer: Self-pay | Admitting: Internal Medicine

## 2010-06-10 ENCOUNTER — Telehealth: Payer: Self-pay | Admitting: Internal Medicine

## 2010-06-17 ENCOUNTER — Encounter (INDEPENDENT_AMBULATORY_CARE_PROVIDER_SITE_OTHER): Payer: Self-pay | Admitting: *Deleted

## 2010-08-08 ENCOUNTER — Telehealth: Payer: Self-pay | Admitting: Internal Medicine

## 2010-08-11 ENCOUNTER — Telehealth (INDEPENDENT_AMBULATORY_CARE_PROVIDER_SITE_OTHER): Payer: Self-pay | Admitting: *Deleted

## 2010-08-13 ENCOUNTER — Encounter: Payer: Self-pay | Admitting: Cardiology

## 2010-08-13 ENCOUNTER — Encounter: Payer: Self-pay | Admitting: Internal Medicine

## 2010-09-03 ENCOUNTER — Encounter: Payer: Self-pay | Admitting: Cardiology

## 2010-09-03 ENCOUNTER — Encounter: Payer: Self-pay | Admitting: Internal Medicine

## 2010-09-22 ENCOUNTER — Encounter: Payer: Self-pay | Admitting: Internal Medicine

## 2010-10-15 ENCOUNTER — Encounter: Payer: Self-pay | Admitting: Internal Medicine

## 2010-10-15 ENCOUNTER — Encounter: Payer: Self-pay | Admitting: Cardiology

## 2010-11-09 LAB — CONVERTED CEMR LAB
CO2: 31 meq/L (ref 19–32)
Calcium: 8.8 mg/dL (ref 8.4–10.5)
GFR calc non Af Amer: 78.47 mL/min (ref >60)
Potassium: 3.9 meq/L (ref 3.5–5.1)
Sodium: 143 meq/L (ref 135–145)

## 2010-11-11 NOTE — Progress Notes (Signed)
Summary: Calling regarding Diovon   Phone Note Call from Patient Call back at Home Phone [phone]   Caller: Patient Summary of Call: Pt wanted to let nurse know how the Diovan was working out  after two weeks Initial call taken by: Judie Grieve,  April 07, 2010 10:19 AM  Follow-up for Phone Call        SPOKE WITH PT SINCE STARTING TO TAKE DIOVAN AT 10:00 PM  B/P IS BETTER DURING DAY WHEN MOST ACTIVE. PER PT JUST WANTED TO LET YOU KNOW. Follow-up by: Scherrie Bateman, LPN,  April 07, 2010 4:55 PM

## 2010-11-11 NOTE — Progress Notes (Signed)
Summary: pradaxa  Medications Added ACIPHEX 20 MG TBEC (RABEPRAZOLE SODIUM) Take 1 tablet by mouth once a day ACIPHEX 20 MG TBEC (RABEPRAZOLE SODIUM) Take 1 tablet by mouth once a day NEXIUM 40 MG CPDR (ESOMEPRAZOLE MAGNESIUM) Take 1 capsule by mouth every morning PRAVASTATIN SODIUM 40 MG TABS (PRAVASTATIN SODIUM) 1 by mouth at bedtime POTASSIUM CHLORIDE CRYS CR 20 MEQ TBCR (POTASSIUM CHLORIDE CRYS CR) 1 by mouth two times a day POTASSIUM CHLORIDE CRYS CR 20 MEQ TBCR (POTASSIUM CHLORIDE CRYS CR) 1 by mouth two times a day WARFARIN SODIUM 5 MG TABS (WARFARIN SODIUM) as directed WARFARIN SODIUM 5 MG TABS (WARFARIN SODIUM) as directed CLARINEX 5 MG  TABS (DESLORATADINE) as needed CLARINEX 5 MG  TABS (DESLORATADINE) as needed DIOVAN 320 MG  TABS (VALSARTAN) 1 by mouth daily DIOVAN 320 MG  TABS (VALSARTAN) 1 by mouth at bedtime MAGNESIUM   CAPS (MAGNESIUM CAPS) 1 by mouth every am MAGNESIUM   CAPS (MAGNESIUM CAPS) 1 by mouth qam TYLENOL   TABS (ACETAMINOPHEN TABS) as needed TYLENOL   TABS (ACETAMINOPHEN TABS) as needed ROBITUSSIN CHEST CONGESTION 100 MG/5ML  SYRP (GUAIFENESIN) as needed BL POTASSIUM 99 MG  TABS (POTASSIUM) 1 QD BL POTASSIUM 99 MG  TABS (POTASSIUM) 1 QD ADPRIN B 325 MG  TABS (ASPIRIN BUF(CACARB-MGCARB-MGO)) 1 QD ADPRIN B 325 MG  TABS (ASPIRIN BUF(CACARB-MGCARB-MGO)) 1 QD WARFARIN SODIUM 5 MG TABS (WARFARIN SODIUM) Use as directed by Anticoagulation Clinic WARFARIN SODIUM 10 MG  TABS (WARFARIN SODIUM) AS DIRECTED WARFARIN SODIUM 5 MG TABS (WARFARIN SODIUM) Use as directed by Anticoagulation Clinic KLOR-CON M20 20 MEQ  TBCR (POTASSIUM CHLORIDE CRYS CR) Take 1 tablet by mouth once a day KLOR-CON M20 20 MEQ  TBCR (POTASSIUM CHLORIDE CRYS CR) as needed AS NEEDED METOPROLOL TARTRATE 25 MG  TABS (METOPROLOL TARTRATE) 1 daily METOPROLOL TARTRATE 25 MG  TABS (METOPROLOL TARTRATE) 1 daily FUROSEMIDE 20 MG  TABS (FUROSEMIDE) one tab by mouth qAM. FUROSEMIDE 20 MG  TABS (FUROSEMIDE)  one tab by mouth qAM. * RYTHMOL SR 325 MG  CP12 (PROPAFENONE HCL) one tab by mouth bid * RYTHMOL SR 325 MG  CP12 (PROPAFENONE HCL) one tab by mouth bid RYTHMOL SR 225 MG  CP12 (PROPAFENONE HCL) one tab by mouth bid ZOFRAN 4 MG TABS (ONDANSETRON HCL) as needed as directed by [facility] ZOFRAN 4 MG TABS (ONDANSETRON HCL) as needed as directed by [facility] TOPROL XL 25 MG XR24H-TAB (METOPROLOL SUCCINATE) 1 daily by mouth TOPROL XL 25 MG XR24H-TAB (METOPROLOL SUCCINATE) 1 daily by mouth WARFARIN SODIUM 5 MG TABS (WARFARIN SODIUM) Take 1 tablet by mouth once a day BUFFERED ASPIRIN 325 MG TABS (ASPIRIN BUF(CACARB-MGCARB-MGO)) 1 daily by mouth WARFARIN SODIUM 5 MG TABS (WARFARIN SODIUM) Take 1 tablet by mouth once a day or as directed CLOBETASOL PROPIONATE 0.05 % OINT (CLOBETASOL PROPIONATE) apply to area two times a day, don't use on face. CLOBETASOL PROPIONATE 0.05 % OINT (CLOBETASOL PROPIONATE) apply to area two times a day, don't use on face. TOPROL XL 50 MG XR24H-TAB (METOPROLOL SUCCINATE) Take 1 tablet by mouth once a day TOPROL XL 50 MG XR24H-TAB (METOPROLOL SUCCINATE) Take 1 tablet by mouth once a day METOPROLOL SUCCINATE 25 MG XR24H-TAB (METOPROLOL SUCCINATE) Take one tablet by mouth daily METOPROLOL SUCCINATE 25 MG XR24H-TAB (METOPROLOL SUCCINATE) Take one tablet by mouth daily RYTHMOL SR 325 MG XR12H-CAP (PROPAFENONE HCL) Take 1 tablet by mouth two times a day RYTHMOL SR 325 MG XR12H-CAP (PROPAFENONE HCL) Take 1 tablet by mouth two times a day *  C-PAP MACHINE at night TIKOSYN 500 MCG CAPS (DOFETILIDE) 1 tab by mouth every 12 hours PRADAXA 150 MG CAPS (DABIGATRAN ETEXILATE MESYLATE) Take 1 capsule two times a day       Phone Note Call from Patient Call back at Home Phone [phone]   Caller: Patient Reason for Call: Talk to Nurse Summary of Call: request to speak to the coumadin clinic about his pardaxa, was told we would send to pharmacy, they have not heard anything Initial call taken  by: Migdalia Dk,  March 27, 2010 1:54 PM    New/Updated Medications: PRADAXA 150 MG CAPS (DABIGATRAN ETEXILATE MESYLATE) Take 1 capsule two times a day Prescriptions: PRADAXA 150 MG CAPS (DABIGATRAN ETEXILATE MESYLATE) Take 1 capsule two times a day  #60 x 3   Entered by:   Bethena Midget, RN, BSN   Authorized by:   Gaylord Shih, MD, [facility]   Signed by:   Bethena Midget, RN, BSN on 03/27/2010   Method used:   Electronically to        [facility] # [phone]* (retail)       [address]       [place], [place]  [address]       Ph: [phone]       Fax: [phone]   RxID:   [id number]

## 2010-11-11 NOTE — Medication Information (Signed)
Summary: rov/ewj  Anticoagulant Therapy  Managed by: Eda Keys, PharmD Referring MD: Valera Castle MD PCP: Shaune Leeks MD Supervising MD: Eden Emms MD, Theron Arista Indication 1: Atrial Flutter (427.32) Indication 2: ablation pending see weekly (ICD-000000) Lab Used: [facility] INR POC 2.4 INR RANGE 2 - 3  Dietary changes: no    Health status changes: no    Bleeding/hemorrhagic complications: no    Recent/future hospitalizations: no    Any changes in medication regimen? no    Recent/future dental: no  Any missed doses?: no       Is patient compliant with meds? yes       Current Medications (verified): 1)  Nexium 40 Mg Cpdr (Esomeprazole Magnesium) .... Take 1 Capsule By Mouth Every Morning 2)  Pravastatin Sodium 40 Mg Tabs (Pravastatin Sodium) .Marland Kitchen.. 1 By Mouth At Bedtime 3)  Diovan 320 Mg  Tabs (Valsartan) .Marland Kitchen.. 1 By Mouth Daily 4)  Magnesium   Caps (Magnesium Caps) .Marland Kitchen.. 1 By Mouth Qam 5)  Robitussin Chest Congestion 100 Mg/52ml  Syrp (Guaifenesin) .... As Needed 6)  Klor-Con M20 20 Meq  Tbcr (Potassium Chloride Crys Cr) .... Take 1 Tablet By Mouth Once A Day 7)  Warfarin Sodium 5 Mg Tabs (Warfarin Sodium) .... Take 1 Tablet By Mouth Once A Day or As Directed 8)  Metoprolol Succinate 25 Mg Xr24h-Tab (Metoprolol Succinate) .... Take One Tablet By Mouth Daily 9)  C-Pap Machine .... At Night 10)  Tikosyn 500 Mcg Caps (Dofetilide) .Marland Kitchen.. 1 Tab By Mouth Every 12 Hours  Allergies (verified): No Known Drug Allergies  Anticoagulation Management History:      The patient is taking warfarin and comes in today for a routine follow up visit.  Positive risk factors for bleeding include an age of 75 years or older and presence of serious comorbidities.  The bleeding index is 'intermediate risk'.  Positive CHADS2 values include History of HTN and History of Diabetes.  Negative CHADS2 values include Age > 69 years old.  The start date was 09/11/2004.  His last INR was 3.0 ratio.  Anticoagulation  responsible provider: Eden Emms MD, Theron Arista.  INR POC: 2.4.  Cuvette Lot#: [id number].  Exp: 01/2011.    Anticoagulation Management Assessment/Plan:      The patient's current anticoagulation dose is Warfarin sodium 5 mg tabs: Take 1 tablet by mouth once a day or as directed.  The target INR is 2 - 3.  The next INR is due 12/11/2009.  Anticoagulation instructions were given to patient.  Results were reviewed/authorized by Eda Keys, PharmD.  He was notified by Eda Keys, PharmD.         Prior Anticoagulation Instructions: INR 2.4  Continue on same dosage 1 tablet daily except 1.5 tablets on Tuesdays, Thursdays, and Saturdays.   Recheck in 4 weeks.    Current Anticoagulation Instructions: INR 2.4  Continue current dosing schedule of 1.5 tablets on Tuesday, Thursday, and Saturday, and take 1 tablet all other days. Return to clinic in 4 weeks.

## 2010-11-11 NOTE — Medication Information (Signed)
Summary: rov/sp  Anticoagulant Therapy  Managed by: Bethena Midget, RN, BSN Referring MD: Valera Castle MD PCP: Illene Regulus Supervising MD: Myrtis Ser MD, Tinnie Gens Indication 1: Atrial Flutter (427.32) Indication 2: ablation pending see weekly (ICD-000000) Lab Used: [facility] INR POC 1.3 INR RANGE 2 - 3  Dietary changes: no    Health status changes: no    Bleeding/hemorrhagic complications: no    Recent/future hospitalizations: no     Recent/future dental: no  Any missed doses?: yes     Details: Took last dose on Thursday, holding for Pradaxa start.   Is patient compliant with meds? yes       Allergies: No Known Drug Allergies  Anticoagulation Management History:      The patient is taking warfarin and comes in today for a routine follow up visit.  Positive risk factors for bleeding include an age of 75 years or older and presence of serious comorbidities.  The bleeding index is 'intermediate risk'.  Positive CHADS2 values include History of HTN and History of Diabetes.  Negative CHADS2 values include Age > 27 years old.  The start date was 09/11/2004.  His last INR was 3.0 ratio.  Anticoagulation responsible provider: Myrtis Ser MD, Tinnie Gens.  INR POC: 1.3.  Cuvette Lot#: [id number].  Exp: 05/2011.    Anticoagulation Management Assessment/Plan:      The patient's current anticoagulation dose is Warfarin sodium 5 mg tabs: Take 1 tablet by mouth once a day or as directed.  The target INR is 2 - 3.  The next INR is due 04/22/2010.  Anticoagulation instructions were given to patient.  Results were reviewed/authorized by Bethena Midget, RN, BSN.  He was notified by Bethena Midget, RN, BSN.         Prior Anticoagulation Instructions: INR 2.2 Continue 5mg s everyday except 7.5mg s on Tuesdays, Thursdays and Saturdays. Recheck in 4 weeks.   Current Anticoagulation Instructions: INR 1.3 Start Pradaxa 150mg s Twice a day. Lab work in 7-10 days.

## 2010-11-11 NOTE — Assessment & Plan Note (Signed)
Summary: CPX/DLO   Vital Signs:  Patient profile:   77 year old male Weight:      257.25 pounds Temp:     98.1 degrees F oral Pulse rate:   68 / minute Pulse rhythm:   regular BP sitting:   138 / 72  (left arm) Cuff size:   large  Vitals Entered By: Sydell Axon LPN (Mar 06, 2010 1:52 PM) CC: 30 minute checkup, had a colonosocpy 03/02 by Dr. Jarold Motto   History of Present Illness: Pt here for Comp Exam. He has tremors. His father had Parkinson's. He is agreeable to wait a while for referral and eval. He has been on CPAP a while now and was assx from it. He haqs been wondering if he is symptomatic with fatigue and has stopped Metoprolol as a check. He isw due to see Dr Berneice Gandy again in the next weeks. He has no other problems. He has rhus derm and is using Clobetasol. He has skin tags on the eyelids but realizes that is cosmetic.   Preventive Screening-Counseling & Management  Alcohol-Tobacco     Alcohol drinks/day: <1     Alcohol type: wine 2 per week, beer per week     Smoking Status: quit     Year Quit: 1968     Pack years: 40     Passive Smoke Exposure: no  Caffeine-Diet-Exercise     Caffeine use/day: 1     Does Patient Exercise: yes     Type of exercise: walking 3mi     Times/week: 5  Problems Prior to Update: 1)  Skin Rash  (ICD-782.1) 2)  Obstructive Sleep Apnea  (ICD-327.23) 3)  Atrial Fibrillation  (ICD-427.31) 4)  Hypertension  (ICD-401.9) 5)  Hyperlipidemia  (ICD-272.4) 6)  Other Screening Mammogram  (ICD-V76.12) 7)  Neoplasm, Malignant, Breast, Family Hx, Brother  (ICD-V16.3) 8)  Poisn Electrolytic Caloric&water-balance Agts  (ICD-974.5) 9)  Aodm  (ICD-250.00) 10)  Special Screening Malignant Neoplasm of Prostate  (ICD-V76.44) 11)  Bcc of Nose, Left  () 12)  2% Permanent Partial Impairment, Right Knee  () 13)  Gastric Ulcer/hiatal Hernia  (ICD-531.90) 14)  Diverticulosis, Colon  (ICD-562.10) 15)  Allergic Rhinitis  (ICD-477.9)  Medications Prior to  Update: 1)  Nexium 40 Mg Cpdr (Esomeprazole Magnesium) .... Take 1 Capsule By Mouth Every Morning 2)  Pravastatin Sodium 40 Mg Tabs (Pravastatin Sodium) .Marland Kitchen.. 1 By Mouth At Bedtime 3)  Diovan 320 Mg  Tabs (Valsartan) .Marland Kitchen.. 1 By Mouth Daily 4)  Magnesium   Caps (Magnesium Caps) .Marland Kitchen.. 1 By Mouth Qam 5)  Robitussin Chest Congestion 100 Mg/54ml  Syrp (Guaifenesin) .... As Needed 6)  Klor-Con M20 20 Meq  Tbcr (Potassium Chloride Crys Cr) .... Take 1 Tablet By Mouth Once A Day 7)  Warfarin Sodium 5 Mg Tabs (Warfarin Sodium) .... Take 1 Tablet By Mouth Once A Day or As Directed 8)  C-Pap Machine .... At Night 9)  Tikosyn 500 Mcg Caps (Dofetilide) .Marland Kitchen.. 1 Tab By Mouth Every 12 Hours  Allergies: No Known Drug Allergies  Family History: Father: Dec. 90 Stroke Bladder Ca Mother: Dec. 89 MI Htn  Breast Ca  Brother dec 1 CAD, A.Fib Brother A 82 Theodoro Grist) Breast Cancer Brother A 65 (Tim) Afib  VV Tremors  Stroke Daughter:  Lobbyist ablation CV:  + Mother died MI BP:  + Mother CA:  + Mother breast, GM breast/brain, Father bladder Stroke:  + Father  Social History: Caffeine use/day:  1  Review of  Systems General:  Denies chills, fatigue, fever, sweats, weakness, and weight loss. Eyes:  Denies blurring, discharge, and eye pain. ENT:  Complains of decreased hearing; denies difficulty swallowing and earache. CV:  Complains of palpitations; denies chest pain or discomfort, fainting, fatigue, shortness of breath with exertion, swelling of feet, and swelling of hands; improved. Resp:  Denies cough, shortness of breath, and wheezing. GI:  Denies abdominal pain, bloody stools, change in bowel habits, constipation, dark tarry stools, diarrhea, indigestion, loss of appetite, nausea, vomiting, vomiting blood, and yellowish skin color. GU:  Denies discharge, dysuria, nocturia, and urinary frequency. MS:  Complains of joint pain, joint redness, and cramps; denies muscle aches and stiffness; multiple joints mild  occas cramps. . Derm:  Denies dryness, itching, and rash. Neuro:  Denies numbness, poor balance, tingling, and tremors.  Physical Exam  General:  Well-developed,well-nourished,in no acute distress; alert,appropriate and cooperative throughout examination, mildly obese but improved over last time. Head:  Normocephalic and atraumatic without obvious abnormalities. No apparent alopecia but mild balding. Sinuses NT. Eyes:  PERRLA/EOM intact; conjunctiva and lids normal. Ears:  External ear exam shows no significant lesions or deformities.  Otoscopic examination reveals clear canals, tympanic membranes are intact bilaterally without bulging, retraction, inflammation or discharge. Hearing is grossly decreased bilaterally with hearing aids in place. Nose:  External nasal examination shows no deformity or inflammation. Nasal mucosa are pink and moist without lesions or exudates. Mouth:  Teeth, gums and palate normal. Oral mucosa normal. Neck:  Neck supple, no JVD. No masses, thyromegaly or abnormal cervical nodes. Chest Wall:  no deformities or breast masses noted Breasts:  No masses or gynecomastia noted Lungs:  Clear bilaterally to auscultation and percussion. Heart:  Seemingly nml rate and rhythm altho has had various arrhythmias historically. No exrtra sounds heard today. Had 2 dropped beats and one extra beat in 3 mins of ausculltation Abdomen:  Bowel sounds positive; abdomen soft and non-tender without masses, organomegaly, or hernias noted. No hepatosplenomegaly. Rectal:  No external abnormalities noted. Normal sphincter tone. No rectal masses or tenderness. G neg. Genitalia:  Testes bilaterally descended without nodularity, tenderness or masses. No scrotal masses or lesions. No penis lesions or urethral discharge. Prostate:  Prostate gland firm and smooth, no enlargement, nodularity, tenderness, mass, asymmetry or induration. 20gms. Msk:  No deformity or scoliosis noted of thoracic or lumbar  spine.   Pulses:  pulses normal in all 4 extremities Extremities:  1+ left pedal edema and 1+ right pedal edema.   Neurologic:  No cranial nerve deficits noted. Station and gait are normal. Sensory, motor and coordinative functions appear intact. Minimal resting tremor. Gait initiation nml, expression nml. Skin:  Fairly patchy, some areas classically linear vessicular rash with erythematous, in some areas, mostly on the legs. Multiple benign moles and skintags on the trunk. Cervical Nodes:  No lymphadenopathy noted Inguinal Nodes:  No significant adenopathy Psych:  Cognition and judgment appear intact. Alert and cooperative with normal attention span and concentration. No apparent delusions, illusions, hallucinations  Diabetes Management Exam:    Foot Exam (with socks and/or shoes not present):       Sensory-Pinprick/Light touch:          Left medial foot (L-4): normal          Left dorsal foot (L-5): normal          Left lateral foot (S-1): normal          Right medial foot (L-4): normal  Right dorsal foot (L-5): normal          Right lateral foot (S-1): normal       Sensory-Monofilament:          Left foot: normal          Right foot: normal       Inspection:          Left foot: normal          Right foot: normal       Nails:          Left foot: normal          Right foot: normal    Eye Exam:       Eye Exam done elsewhere          Date: 06/26/2009          Results: normal          Done by: Dr London Sheer   Impression & Recommendations:  Problem # 1:  ATRIAL FIBRILLATION (ICD-427.31) Assessment Unchanged  Stable and sounds reasonably NS. Cont f/u with Dr Berneice Gandy. His updated medication list for this problem includes:    Warfarin Sodium 5 Mg Tabs (Warfarin sodium) .Marland Kitchen... Take 1 tablet by mouth once a day or as directed    Tikosyn 500 Mcg Caps (Dofetilide) .Marland Kitchen... 1 tab by mouth every 12 hours  Reviewed the following: PT: 18.7 (04/02/2009)   INR: 3.0 ratio  (01/03/2009) Coumadin Dose (weekly): 42.50 mg (03/05/2010) Prior Coumadin Dose (weekly): 42.50 mg (03/05/2010) Next Protime: 03/26/2010 (dated on 03/05/2010)  Problem # 2:  OBSTRUCTIVE SLEEP APNEA (ICD-327.23) Assessment: Unchanged Tolerating CPAP well.  Problem # 3:  HYPERTENSION (ICD-401.9) Assessment: Unchanged Stable. His updated medication list for this problem includes:    Diovan 320 Mg Tabs (Valsartan) .Marland Kitchen... 1 by mouth daily  Orders: Prescription Created Electronically [phone])  BP today: 138/72 Prior BP: 138/76 (09/30/2009)  Labs Reviewed: K+: 4.3 (02/26/2010) Creat: : 1.0 (02/26/2010)   Chol: 134 (02/26/2010)   HDL: 47.80 (02/26/2010)   LDL: 80 (02/26/2010)   TG: 29.0 (02/26/2010)  Problem # 4:  HYPERLIPIDEMIA (ICD-272.4) Assessment: Unchanged Good nos. Cont Prava. His updated medication list for this problem includes:    Pravastatin Sodium 40 Mg Tabs (Pravastatin sodium) .Marland Kitchen... 1 by mouth at bedtime  Orders: Prescription Created Electronically [phone])  Labs Reviewed: SGOT: 25 (02/26/2010)   SGPT: 28 (02/26/2010)   HDL:47.80 (02/26/2010), 53.20 (02/28/2009)  LDL:80 (02/26/2010), 74 (02/28/2009)  Chol:134 (02/26/2010), 135 (02/28/2009)  Trig:29.0 (02/26/2010), 41.0 (02/28/2009)  Problem # 5:  AODM (ICD-250.00) Assessment: Unchanged  Discussed. He is diet controlled and well controlled thereby. Cont as is. Discussed yearly eye exams and foot exams. Kidney fctn nml. His updated medication list for this problem includes:    Diovan 320 Mg Tabs (Valsartan) .Marland Kitchen... 1 by mouth daily  Labs Reviewed: Creat: 1.0 (02/26/2010)   Microalbumin: 4.7 (10/12/2006)  Last Eye Exam: normal (06/26/2009) Reviewed HgBA1c results: 6.3 (02/26/2010)  6.3 (09/02/2009)  Problem # 6:  SPECIAL SCREENING MALIGNANT NEOPLASM OF PROSTATE (ICD-V76.44) Assessment: Unchanged Stable PSA and exam.  Problem # 7:  DIVERTICULOSIS, COLON (ICD-562.10) Assessment: Unchanged Discussed being seen for  prolonged LLQ discomfort. Colonoscopy:  Labs Reviewed: Hgb: 14.0 (02/26/2010)   Hct: 41.1 (02/26/2010)   WBC: 3.5 (02/26/2010)  Complete Medication List: 1)  Nexium 40 Mg Cpdr (Esomeprazole magnesium) .... Take 1 capsule by mouth every morning 2)  Pravastatin Sodium 40 Mg Tabs (Pravastatin sodium) .Marland Kitchen.. 1 by mouth at bedtime 3)  Diovan 320 Mg Tabs (Valsartan) .Marland Kitchen.. 1 by mouth daily 4)  Magnesium Caps (Magnesium caps) .Marland Kitchen.. 1 by mouth every am 5)  Robitussin Chest Congestion 100 Mg/73ml Syrp (Guaifenesin) .... As needed 6)  Klor-con M20 20 Meq Tbcr (Potassium chloride crys cr) .... Take 1 tablet by mouth once a day 7)  Warfarin Sodium 5 Mg Tabs (Warfarin sodium) .... Take 1 tablet by mouth once a day or as directed 8)  C-pap Machine  .... At night 9)  Tikosyn 500 Mcg Caps (Dofetilide) .Marland Kitchen.. 1 tab by mouth every 12 hours  Patient Instructions: 1)  Google "jogging in  a jug" and start taking for leg cramps. 2)  RTC as needed  Prescriptions: WARFARIN SODIUM 5 MG TABS (WARFARIN SODIUM) Take 1 tablet by mouth once a day or as directed  #90 x 3   Entered and Authorized by:   Shaune Leeks MD   Signed by:   Shaune Leeks MD on 03/06/2010   Method used:   Electronically to        [facility] # [phone]* (retail)       [address]       [place], [place]  [address]       Ph: [phone]       Fax: [phone]   RxID:   [id number] DIOVAN 320 MG  TABS (VALSARTAN) 1 by mouth daily  #30 x 11   Entered and Authorized by:   Shaune Leeks MD   Signed by:   Shaune Leeks MD on 03/06/2010   Method used:   Electronically to        [facility] # [phone]* (retail)       [address]       [place], [place]  [address]       Ph: [phone]       Fax: [phone]   RxID:   [id number] PRAVASTATIN SODIUM 40 MG TABS (PRAVASTATIN SODIUM) 1 by mouth at bedtime  #90.0 Each x 3   Entered and Authorized by:   Shaune Leeks MD   Signed by:   Shaune Leeks MD on 03/06/2010   Method used:   Electronically to        [facility] # [phone]* (retail)       [address]       [place], [place]  [address]       Ph: [phone]       Fax: [phone]   RxID:   [id number] NEXIUM 40 MG CPDR (ESOMEPRAZOLE MAGNESIUM) Take 1 capsule by mouth every morning  #30.0 Each x 11   Entered and Authorized by:   Shaune Leeks MD   Signed by:   Shaune Leeks MD on 03/06/2010   Method used:   Electronically to        [facility] # [phone]* (retail)       [address]       [place], [place]  [address]       Ph: [phone]       Fax: [phone]   RxID:   [id number]   Current Allergies (reviewed today): No known allergies

## 2010-11-11 NOTE — Progress Notes (Signed)
----   Converted from flag ---- ---- 08/08/2010 2:22 PM, Lamar Sprinkles, CMA wrote: Pt will schedule his own mamogram ------------------------------

## 2010-11-11 NOTE — Miscellaneous (Signed)
Summary: med update  Clinical Lists Changes  Medications: Removed medication of WARFARIN SODIUM 5 MG TABS (WARFARIN SODIUM) Take 1 tablet by mouth once a day or as directed

## 2010-11-11 NOTE — Letter (Signed)
Summary: Guy Moore letter  [facility]  [address] [place], [place] [address]   Phone: [phone]  Fax: [phone]       05/20/2010 MRN: [id number]  Guy Moore [address] [facility] [address] [place], [place]  [address]  Dear Mr. [name] [place], and [facility] announce the retirement of Arta Silence, M.D., from full-time practice at the [facility] office effective April 10, 2010 and his plans of returning part-time.  It is important to Dr. Hetty Ely and to our practice that you understand that [facility] has seven physicians in our office for your health care needs.  We will continue to offer the same exceptional care that you have today.    Dr. Hetty Ely has spoken to many of you about his plans for retirement and returning part-time in the fall.   We will continue to work with you through the transition to schedule appointments for you in the office and meet the high standards that [facility] is committed to.   Again, it is with great pleasure that we share the news that Dr. Hetty Ely will return to [facility] at [facility] in October of 2011 with a reduced schedule.    If you have any questions, or would like to request an appointment with one of our physicians, please call us at [phone] and press the option for Scheduling an appointment.  We take pleasure in providing you with excellent patient care and look forward to seeing you at your next office visit.  Our [facility] Physicians are:  Tillman Abide, M.D. Laurita Quint, M.D. Roxy Manns, M.D. Kerby Nora, M.D. Hannah Beat, M.D. Ruthe Mannan, M.D. We proudly welcomed Raechel Ache, M.D. and Eustaquio Boyden, M.D. to the practice in July/August 2011.  Sincerely,   [facility]

## 2010-11-11 NOTE — Progress Notes (Signed)
Summary: CALLING REGARDING PRADAXA NEED DIRECTION   Phone Note Call from Patient Call back at Home Phone [phone]   Caller: Patient Summary of Call: PT WANT TO TALK TO SOMEBODY REGARDING HIS PRADAXA HE HAS IT WITH HIM NOW Initial call taken by: Judie Grieve,  March 28, 2010 11:30 AM  Follow-up for Phone Call        Pt has pradaxa rx at home now.  Told him to hold Coumadin over the weekend.  We will check INR on Monday.  If INR <2, will start Pradaxa.  Follow-up by: Weston Brass PharmD,  March 28, 2010 1:52 PM

## 2010-11-11 NOTE — Progress Notes (Signed)
  Phone Note Refill Request Message from:  Fax from Pharmacy on June 06, 2010 11:13 AM  Refills Requested: Medication #1:  NEXIUM 40 MG CPDR Take 1 capsule by mouth every morning Initial call taken by: Ami Bullins CMA,  June 06, 2010 11:13 AM    Prescriptions: NEXIUM 40 MG CPDR (ESOMEPRAZOLE MAGNESIUM) Take 1 capsule by mouth every morning  #90 x 3   Entered by:   Ami Bullins CMA   Authorized by:   Jacques Navy MD   Signed by:   Bill Salinas CMA on 06/06/2010   Method used:   Faxed to ...       [facility] Environmental education officer)       [address] [address]       [place], [place]  [address]       Ph: [phone]       Fax: [phone]   RxID:   [id number]

## 2010-11-11 NOTE — Progress Notes (Signed)
   Phone Note Outgoing Call   Call placed by: Scherrie Bateman, LPN,  October 29, 2009 11:06 AM Summary of Call: SPOKE WITH PT AT THIS TIME IS IN NORMAL SINUS RYTHMN IS CURRENTLY TAKING TIKOSYN PER DR HRANIZKY.DID HAVE B/P ISSUES IN PAST THOSE HAVE RESOLVED .INSTRUCTED TO CALL IF DOES DEVELOP ISSUES VERBALZIED UNDERSTANDING FYI Initial call taken by: Scherrie Bateman, LPN,  October 29, 2009 11:08 AM

## 2010-11-11 NOTE — Medication Information (Signed)
Summary: rov/ewj  Anticoagulant Therapy  Managed by: Weston Brass, PharmD Referring MD: Valera Castle MD PCP: Shaune Leeks MD Supervising MD: Gala Romney MD, Reuel Boom Indication 1: Atrial Flutter (427.32) Indication 2: ablation pending see weekly (ICD-000000) Lab Used: [facility] INR POC 2.0 INR RANGE 2 - 3  Dietary changes: no    Health status changes: no    Bleeding/hemorrhagic complications: no    Recent/future hospitalizations: no    Any changes in medication regimen? no    Recent/future dental: no  Any missed doses?: no       Is patient compliant with meds? no     Details: Patient delyed taking his tikosyn dose which affected other doses. His is been attened to by DR [facility].   Allergies: No Known Drug Allergies  Anticoagulation Management History:      The patient is taking warfarin and comes in today for a routine follow up visit.  Positive risk factors for bleeding include an age of 75 years or older and presence of serious comorbidities.  The bleeding index is 'intermediate risk'.  Positive CHADS2 values include History of HTN and History of Diabetes.  Negative CHADS2 values include Age > 54 years old.  The start date was 09/11/2004.  His last INR was 3.0 ratio.  Anticoagulation responsible provider: Mekiyah Gladwell MD, Reuel Boom.  INR POC: 2.0.  Cuvette Lot#: [id number].  Exp: 05/2011.    Anticoagulation Management Assessment/Plan:      The patient's current anticoagulation dose is Warfarin sodium 5 mg tabs: Take 1 tablet by mouth once a day or as directed.  The target INR is 2 - 3.  The next INR is due 03/26/2010.  Anticoagulation instructions were given to patient.  Results were reviewed/authorized by Weston Brass, PharmD.  He was notified by [facility].         Prior Anticoagulation Instructions: INR 2.2  Continue on same dosage 1 tablet daily except 1.5 tablets on Tuesdays, Thursdays, and Saturdays.  Recheck in 4 weeks.    Current  Anticoagulation Instructions: INR-2.0 Take 1.5 tablets today and resume normal dosing schedule. Take 1.5 tablets Tuesday, Thursday and Saturday, and take 1 tablet daily on all other days. Return  in 3 weeks.

## 2010-11-11 NOTE — Letter (Signed)
Summary: SMN for CPAP Supplies/[facility]  SMN for CPAP Supplies/[facility]   Imported By: Sherian Rein 11/07/2009 08:25:06  _____________________________________________________________________  External Attachment:    Type:   Image     Comment:   External Document

## 2010-11-11 NOTE — Medication Information (Signed)
Summary: rov/kb  Anticoagulant Therapy  Managed by: Bethena Midget, RN, BSN Referring MD: Valera Castle MD PCP: Shaune Leeks MD Supervising MD: Excell Seltzer MD, Casimiro Needle Indication 1: Atrial Flutter (427.32) Indication 2: ablation pending see weekly (ICD-000000) Lab Used: [facility] INR POC 2.2 INR RANGE 2 - 3  Dietary changes: no    Health status changes: no    Bleeding/hemorrhagic complications: no    Recent/future hospitalizations: no    Any changes in medication regimen? no    Recent/future dental: no  Any missed doses?: no       Is patient compliant with meds? yes      Comments: Saw Dr Daleen Squibb today.   Allergies: No Known Drug Allergies  Anticoagulation Management History:      The patient is taking warfarin and comes in today for a routine follow up visit.  Positive risk factors for bleeding include an age of 75 years or older and presence of serious comorbidities.  The bleeding index is 'intermediate risk'.  Positive CHADS2 values include History of HTN and History of Diabetes.  Negative CHADS2 values include Age > 75 years old.  The start date was 09/11/2004.  His last INR was 3.0 ratio.  Anticoagulation responsible provider: Excell Seltzer MD, Casimiro Needle.  INR POC: 2.2.  Cuvette Lot#: [id number].  Exp: 05/2011.    Anticoagulation Management Assessment/Plan:      The patient's current anticoagulation dose is Warfarin sodium 5 mg tabs: Take 1 tablet by mouth once a day or as directed.  The target INR is 2 - 3.  The next INR is due 04/22/2010.  Anticoagulation instructions were given to patient.  Results were reviewed/authorized by Bethena Midget, RN, BSN.  He was notified by Bethena Midget, RN, BSN.         Prior Anticoagulation Instructions: INR-2.0 Take 1.5 tablets today and resume normal dosing schedule. Take 1.5 tablets Tuesday, Thursday and Saturday, and take 1 tablet daily on all other days. Return  in 3 weeks.  Current Anticoagulation Instructions: INR 2.2 Continue 5mg s everyday except  7.5mg s on Tuesdays, Thursdays and Saturdays. Recheck in 4 weeks.

## 2010-11-11 NOTE — Letter (Signed)
Summary: EP/[facility]  Consultation Report   Imported By: West Carbo 04/16/2010 13:58:18  _____________________________________________________________________  External Attachment:    Type:   Image     Comment:   External Document

## 2010-11-11 NOTE — Progress Notes (Signed)
Summary: [facility] EP  [facility]   Imported By: Harlon Flor 12/25/2009 08:57:09  _____________________________________________________________________  External Attachment:    Type:   Image     Comment:   External Document

## 2010-11-11 NOTE — Medication Information (Signed)
Summary: CPAP Compliance  CPAP Compliance   Imported By: Sherian Rein 11/07/2009 08:28:08  _____________________________________________________________________  External Attachment:    Type:   Image     Comment:   External Document

## 2010-11-11 NOTE — Progress Notes (Signed)
Summary: REQ FOR MAMOGRAM ORDER  Phone Note Call from Patient   Summary of Call: Patient is requesting referral for screening mamogram at [facility]. He has this every year due to brother & mother w/breast cancer. Patient is requesting rx for this mailed to his home. Says that when he brings in the order it eliminates confusion.  Initial call taken by: Lamar Sprinkles, CMA,  August 08, 2010 12:22 PM  Follow-up for Phone Call        OK Follow-up by: Jacques Navy MD,  August 08, 2010 1:13 PM  Additional Follow-up for Phone Call Additional follow up Details #1::        Mailed referral Additional Follow-up by: Lamar Sprinkles, CMA,  August 08, 2010 2:47 PM

## 2010-11-11 NOTE — Medication Information (Signed)
Summary: rov/tm  Anticoagulant Therapy  Managed by: Cloyde Reams, RN, BSN Referring MD: Valera Castle MD PCP: Shaune Leeks MD Supervising MD: Clifton James MD, Cristal Deer Indication 1: Atrial Flutter (427.32) Indication 2: ablation pending see weekly (ICD-000000) Lab Used: [facility] INR POC 2.4 INR RANGE 2 - 3  Dietary changes: no    Health status changes: no    Bleeding/hemorrhagic complications: no    Recent/future hospitalizations: no    Any changes in medication regimen? no    Recent/future dental: no  Any missed doses?: no       Is patient compliant with meds? yes       Allergies (verified): No Known Drug Allergies  Anticoagulation Management History:      The patient is taking warfarin and comes in today for a routine follow up visit.  Positive risk factors for bleeding include an age of 9 years or older and presence of serious comorbidities.  The bleeding index is 'intermediate risk'.  Positive CHADS2 values include History of HTN and History of Diabetes.  Negative CHADS2 values include Age > 11 years old.  The start date was 09/11/2004.  His last INR was 3.0 ratio.  Anticoagulation responsible provider: Clifton James MD, Cristal Deer.  INR POC: 2.4.  Cuvette Lot#: [id number].  Exp: 11/2010.    Anticoagulation Management Assessment/Plan:      The patient's current anticoagulation dose is Warfarin sodium 5 mg tabs: Take 1 tablet by mouth once a day or as directed.  The target INR is 2 - 3.  The next INR is due 11/13/2009.  Anticoagulation instructions were given to patient.  Results were reviewed/authorized by Cloyde Reams, RN, BSN.  He was notified by Cloyde Reams RN.         Prior Anticoagulation Instructions: INR 2.2 Continue 5mg s daily except 7.5mg s Tuesdays, Thursdays and Saturdays. Recheck in 3 weeks.   Current Anticoagulation Instructions: INR 2.4  Continue on same dosage 1 tablet daily except 1.5 tablets on Tuesdays, Thursdays, and Saturdays.   Recheck in 4 weeks.

## 2010-11-11 NOTE — Progress Notes (Signed)
  Phone Note Refill Request Message from:  Fax from Pharmacy  Refills Requested: Medication #1:  PRAVASTATIN SODIUM 40 MG TABS 1 by mouth at bedtime Initial call taken by: Ami Bullins CMA,  June 06, 2010 11:10 AM    Prescriptions: PRAVASTATIN SODIUM 40 MG TABS (PRAVASTATIN SODIUM) 1 by mouth at bedtime  #90.0 Each x 3   Entered by:   Ami Bullins CMA   Authorized by:   Jacques Navy MD   Signed by:   Bill Salinas CMA on 06/06/2010   Method used:   Faxed to ...       [facility] Environmental education [facility])       [address] [address]       [place], [place]  [address]       Ph: [phone]       Fax: [phone]   RxID:   [id number]

## 2010-11-11 NOTE — Assessment & Plan Note (Signed)
Summary: rov per pt call/wants to talk about changing to prodaxa/lg  Medications Added DIOVAN 320 MG  TABS (VALSARTAN) 1 by mouth at bedtime      Allergies Added: NKDA  Visit Type:  rov Referring Provider:  Valera Castle  , [place] [facility]  Primary Provider:  Illene Regulus  CC:  no cardiac complaints today..pt wants to talk to Dr. Daleen Squibb about changing to Pradaxa.  History of Present Illness: MR Llorente comes in today for evaluation manage his paroxysmal atrial fibrillation and history of flutter. This is largely managed at [facility].  According to him, he had atypical atrial tachycardia that is required to be on Tikosyn again. He's been on it now for 7 months after cardioversion. The hope is to get this resolved and the medication discontinued this fall. He will follow up at [facility] for this.  He would like to go on Pradaxa. We will ask the Coumadin clinic to address.  He has not had electrolytes were magnesium every 3 months. We'll check today.  His blood pressure done at good control yet in the mornings before he takes his Diovan is around 150. The rest today is in the 120 to 1:30 range.  Current Medications (verified): 1)  Nexium 40 Mg Cpdr (Esomeprazole Magnesium) .... Take 1 Capsule By Mouth Every Morning 2)  Pravastatin Sodium 40 Mg Tabs (Pravastatin Sodium) .Marland Kitchen.. 1 By Mouth At Bedtime 3)  Diovan 320 Mg  Tabs (Valsartan) .Marland Kitchen.. 1 By Mouth Daily 4)  Magnesium   Caps (Magnesium Caps) .Marland Kitchen.. 1 By Mouth Every Am 5)  Robitussin Chest Congestion 100 Mg/82ml  Syrp (Guaifenesin) .... As Needed 6)  Klor-Con M20 20 Meq  Tbcr (Potassium Chloride Crys Cr) .... Take 1 Tablet By Mouth Once A Day 7)  Warfarin Sodium 5 Mg Tabs (Warfarin Sodium) .... Take 1 Tablet By Mouth Once A Day or As Directed 8)  C-Pap Machine .... At Night 9)  Tikosyn 500 Mcg Caps (Dofetilide) .Marland Kitchen.. 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 09/23/04    GARD Stress Myoview - nml. except ? prior scar,  left VE good motion 12/30 - 10/11/04  [facility] dizziness, bradycardia with pauses, numbness 10/10/04    CT Head - min. diffuse cerebral  and cerebellar atrophy 10/28/04      Cardioversion attempt - unsuccessful 11/17/04        Cardioversion on Rhythmol 5/22 - 03/05/05  [facility], recurrent symptoms of A. Fib. 07/13/05      DC cardioversion  sinus (Tikosyn) 09/01/05    [facility], A. Fib., D/C home    Assess due to anxiety, stress of UC 09/10/05    DC cardioversion    sinus 10/22/05  Cardio CT  - wnl except calcification in all three main coronary arteries 5/4-02/13/06  [facility]  A. Fib. - cardiovert  NSR 03/27/06      [facility]  EPS stay - Ablation 09/01/07    Mammo nml SE Radiology 3/5-12/16/07  HOSP [facility] Afib s/p Ablation 09/04/08    Mammo nml SE Radiology 02/13/09        Diag Electrophys Study Complex Supraventr Tachy Ablation (Dr Deno Lunger, [facility])  9/14-9/15/10  HOSP ([facility]) SOB DOE Spiral CT Cardiovasc MRI  06/25/09      Spiral CT LLL Nodule (Repeat 6 mos) Neg PE Mild Emphys 06/25/09      Cadiovasc MRI Nml  Family History: Last updated: 03/06/2010 Father: Dec. 90 Stroke Bladder Ca Mother: Dec. 89 MI Htn  Breast Ca  Brother dec 1 CAD, A.Fib Brother A 82 Theodoro Grist)  Breast Cancer Brother A 45 (Tim) Afib  VV Tremors  Stroke Daughter:  Lobbyist ablation CV:  + Mother died MI BP:  + Mother CA:  + Mother breast, GM breast/brain, Father bladder Stroke:  + Father  Social History: Last updated: 07/04/2009 Former Smoker, 2 PPD x 20 years, quit 1979 YOA Alcohol use-yes, occasional Drug use-no Marital Status: Re-Married Children: 2 Occupation:[employer]  [employer].Marland KitchenMarland KitchenRetired for good  Risk Factors: Alcohol Use: <1 (03/06/2010) Caffeine Use: 1 (03/06/2010) Exercise: yes (03/06/2010)  Risk Factors: Smoking Status: quit (03/06/2010) Passive Smoke Exposure: no (03/06/2010)  Review of Systems       negative other than history of present illness  Vital Signs:  Patient profile:   77 year old male Height:      70 inches Weight:      256 pounds BMI:     36.86 Pulse rate:   62 / minute Pulse rhythm:   irregular BP sitting:   158 / 90  (left arm) Cuff size:   large  Vitals Entered By: Danielle Rankin, CMA (March 25, 2010 1:52 PM)  Physical Exam  General:  obese.   Head:  normocephalic and atraumatic Eyes:  PERRLA/EOM intact; conjunctiva and lids normal. Neck:  Neck supple, no JVD. No masses, thyromegaly or abnormal cervical nodes. Chest Vedika Dumlao:  no deformities or breast masses noted Lungs:  Clear bilaterally to auscultation and percussion. Heart:  Non-displaced PMI, chest non-tender; regular rate and rhythm, S1, S2 without murmurs, rubs or gallops. Carotid upstroke normal, no bruit. Normal abdominal aortic size, no bruits. Femorals normal pulses, no bruits. Pedals normal pulses. No edema, no varicosities. Msk:  Back normal, normal gait. Muscle strength and tone normal. Pulses:  pulses normal in all 4 extremities Extremities:  No clubbing or cyanosis. Neurologic:  Alert and oriented x 3. Skin:  Intact without lesions or rashes. Psych:  Normal affect.   Impression & Recommendations:  Problem # 1:  ATRIAL FIBRILLATION (ICD-427.31) Assessment  Improved I will check electrolytes and magnesium today. His updated medication list for this problem includes:    Warfarin Sodium 5 Mg Tabs (Warfarin sodium) .Marland Kitchen... Take 1 tablet by mouth once a day or as directed    Tikosyn 500 Mcg Caps (Dofetilide) .Marland Kitchen... 1 tab by mouth every 12 hours  Orders: EKG w/ Interpretation (93000) TLB-BMP (Basic Metabolic Panel-BMET) (80048-METABOL) TLB-Magnesium (Mg) (83735-MG)  Problem # 2:  HYPERTENSION (ICD-401.9) Because his blood pressures elevated mornings, we'll have him take his Diovan at night just prior to going to bed. His updated medication list for this problem includes:    Diovan 320 Mg Tabs (Valsartan) .Marland Kitchen... 1 by mouth at bedtime  Orders:  TLB-BMP (Basic Metabolic Panel-BMET) (80048-METABOL) TLB-Magnesium (Mg) (83735-MG)  Problem # 3:  HYPERLIPIDEMIA (ICD-272.4) Assessment: Unchanged  His updated medication list for this problem includes:    Pravastatin Sodium 40 Mg Tabs (Pravastatin sodium) .Marland Kitchen... 1 by mouth at bedtime  Patient Instructions: 1)  Your physician recommends that you schedule a follow-up appointment in: 12 months with Dr. Daleen Squibb 2)  Your physician has recommended you make the following change in your medication: Take Diovan at bedtime 3)  Pharmacist to see you today about Pradaxa 4)  Your physician recommends that you have  lab work  today BMP, MG

## 2010-11-11 NOTE — Letter (Signed)
Summary: walk-in -- patient note  walk-in -- patient note   Imported By: Kassie Mends 11/18/2009 15:18:19  _____________________________________________________________________  External Attachment:    Type:   Image     Comment:   External Document

## 2010-11-11 NOTE — Medication Information (Signed)
Summary: rov/eac  Anticoagulant Therapy  Managed by: Eda Keys, PharmD Referring MD: Valera Castle MD PCP: Shaune Leeks MD Supervising MD: Riley Kill MD, Maisie Fus Indication 1: Atrial Flutter (427.32) Indication 2: ablation pending see weekly (ICD-000000) Lab Used: [facility] INR POC 2.5 INR RANGE 2 - 3  Dietary changes: no    Health status changes: no    Bleeding/hemorrhagic complications: no    Recent/future hospitalizations: no    Any changes in medication regimen? no    Recent/future dental: no  Any missed doses?: no       Is patient compliant with meds? yes       Allergies: No Known Drug Allergies  Anticoagulation Management History:      The patient is taking warfarin and comes in today for a routine follow up visit.  Positive risk factors for bleeding include an age of 24 years or older and presence of serious comorbidities.  The bleeding index is 'intermediate risk'.  Positive CHADS2 values include History of HTN and History of Diabetes.  Negative CHADS2 values include Age > 86 years old.  The start date was 09/11/2004.  His last INR was 3.0 ratio.  Anticoagulation responsible provider: Riley Kill MD, Maisie Fus.  INR POC: 2.5.  Cuvette Lot#: [id number].  Exp: 02/2011.    Anticoagulation Management Assessment/Plan:      The patient's current anticoagulation dose is Warfarin sodium 5 mg tabs: Take 1 tablet by mouth once a day or as directed.  The target INR is 2 - 3.  The next INR is due 01/08/2010.  Anticoagulation instructions were given to patient.  Results were reviewed/authorized by Eda Keys, PharmD.  He was notified by Eda Keys.         Prior Anticoagulation Instructions: INR 2.4  Continue current dosing schedule of 1.5 tablets on Tuesday, Thursday, and Saturday, and take 1 tablet all other days. Return to clinic in 4 weeks.  Current Anticoagulation Instructions: INR 2.5  Continue 1.5 tablets on Tuesday, Thursday, and Saturday, and 1 tablet all other  days.  Return to clinic in 4 weeks.

## 2010-11-11 NOTE — Medication Information (Signed)
Summary: rov/tm  Anticoagulant Therapy  Managed by: Cloyde Reams, RN, BSN Referring MD: Valera Castle MD PCP: Shaune Leeks MD Supervising MD: Gala Romney MD, Reuel Boom Indication 1: Atrial Flutter (427.32) Indication 2: ablation pending see weekly (ICD-000000) Lab Used: [facility] INR POC 2.2 INR RANGE 2 - 3  Dietary changes: yes       Details: diet varied while out of town.    Health status changes: no    Bleeding/hemorrhagic complications: no    Recent/future hospitalizations: no    Any changes in medication regimen? no    Recent/future dental: no  Any missed doses?: no       Is patient compliant with meds? yes       Allergies (verified): No Known Drug Allergies  Anticoagulation Management History:      The patient is taking warfarin and comes in today for a routine follow up visit.  Positive risk factors for bleeding include an age of 75 years or older and presence of serious comorbidities.  The bleeding index is 'intermediate risk'.  Positive CHADS2 values include History of HTN and History of Diabetes.  Negative CHADS2 values include Age > 75 years old.  The start date was 09/11/2004.  His last INR was 3.0 ratio.  Anticoagulation responsible provider: Bensimhon MD, Reuel Boom.  INR POC: 2.2.  Cuvette Lot#: [id number].  Exp: 03/2011.    Anticoagulation Management Assessment/Plan:      The patient's current anticoagulation dose is Warfarin sodium 5 mg tabs: Take 1 tablet by mouth once a day or as directed.  The target INR is 2 - 3.  The next INR is due 03/05/2010.  Anticoagulation instructions were given to patient.  Results were reviewed/authorized by Cloyde Reams, RN, BSN.  He was notified by Cloyde Reams RN.         Prior Anticoagulation Instructions: INR 2.1 Continue 5mg s daily except 7.5mg s on Tuesdays, Thursdays and Saturdays. Recheck in 4 weeks.   Current Anticoagulation Instructions: INR 2.2  Continue on same dosage 1 tablet daily except 1.5 tablets on Tuesdays,  Thursdays, and Saturdays.  Recheck in 4 weeks.

## 2010-11-11 NOTE — Medication Information (Signed)
Summary: rov/eac  Anticoagulant Therapy  Managed by: Bethena Midget, RN, BSN Referring MD: Valera Castle MD PCP: Shaune Leeks MD Supervising MD: Gala Romney MD, Reuel Boom Indication 1: Atrial Flutter (427.32) Indication 2: ablation pending see weekly (ICD-000000) Lab Used: [facility] INR POC 2.1 INR RANGE 2 - 3  Dietary changes: no    Health status changes: no    Bleeding/hemorrhagic complications: no    Recent/future hospitalizations: no    Any changes in medication regimen? no    Recent/future dental: no  Any missed doses?: no       Is patient compliant with meds? yes       Allergies: No Known Drug Allergies  Anticoagulation Management History:      The patient is taking warfarin and comes in today for a routine follow up visit.  Positive risk factors for bleeding include an age of 75 years or older and presence of serious comorbidities.  The bleeding index is 'intermediate risk'.  Positive CHADS2 values include History of HTN and History of Diabetes.  Negative CHADS2 values include Age > 64 years old.  The start date was 09/11/2004.  His last INR was 3.0 ratio.  Anticoagulation responsible provider: Rico Massar MD, Reuel Boom.  INR POC: 2.1.  Cuvette Lot#: [id number].  Exp: 02/2011.    Anticoagulation Management Assessment/Plan:      The patient's current anticoagulation dose is Warfarin sodium 5 mg tabs: Take 1 tablet by mouth once a day or as directed.  The target INR is 2 - 3.  The next INR is due 02/04/2010.  Anticoagulation instructions were given to patient.  Results were reviewed/authorized by Bethena Midget, RN, BSN.  He was notified by Bethena Midget, RN, BSN.         Prior Anticoagulation Instructions: INR 2.5  Continue 1.5 tablets on Tuesday, Thursday, and Saturday, and 1 tablet all other days.  Return to clinic in 4 weeks.   Current Anticoagulation Instructions: INR 2.1 Continue 5mg s daily except 7.5mg s on Tuesdays, Thursdays and Saturdays. Recheck in 4 weeks.

## 2010-11-11 NOTE — Letter (Signed)
Summary: Steward Ros Peifer,PA-C,Cardiology,Note   Imported By: Beau Fanny 12/24/2009 13:59:36  _____________________________________________________________________  External Attachment:    Type:   Image     Comment:   External Document

## 2010-11-11 NOTE — Letter (Signed)
Summary: EP Return Patient/[facility]  EP [facility]   Imported By: Sherian Rein 08/20/2010 10:54:06  _____________________________________________________________________  External Attachment:    Type:   Image     Comment:   External Document

## 2010-11-11 NOTE — Progress Notes (Signed)
  Phone Note Refill Request   pt wants medication sent to [facility]  Initial call taken by: Ami Bullins CMA,  June 10, 2010 11:29 AM    Prescriptions: KLOR-CON M20 20 MEQ  TBCR (POTASSIUM CHLORIDE CRYS CR) Take 1 tablet by mouth once a day  #90 x 3   Entered by:   Ami Bullins CMA   Authorized by:   Jacques Navy MD   Signed by:   Bill Salinas CMA on 06/10/2010   Method used:   Faxed to ...       [facility] Environmental education officer)       [address] [address]       [place], [place]  [address]       Ph: [phone]       Fax: [phone]   RxID:   [id number]

## 2010-11-11 NOTE — Assessment & Plan Note (Signed)
Summary: new / switch'd from schaller to men,ok'd/ mcr / cd   Vital Signs:  Patient profile:   77 year old male Height:      70 inches Weight:      255 pounds BMI:     36.72 O2 Sat:      96 % on Room air Temp:     98.1 degrees F oral Pulse rate:   52 / minute BP sitting:   124 / 82  (left arm) Cuff size:   large  Vitals Entered By: Bill Salinas CMA (May 30, 2010 2:01 PM)  O2 Flow:  Room air CC: new pt est care with primary/ ab Comments Pt is no longer taking Warfarin   Primary Care Provider:  Illene Regulus  CC:  new pt est care with primary/ ab.  History of Present Illness: Patient presents as a transfer from retiring Dr. Hetty Ely.   Has macular degeneration and at exam with Dr. Shea Evans he had a retinal abnormality - referred to retinologist.   He has non-valvular atrial fibrillation: he has had ablation x 2 at [facility]. He has ectopic atrial tachycardia treated with tikosyn. He does have OSA and is on CPAP. He has held sinus rhythm for a year and may be a candidate coming off medication. He was considered for Pradaxa - he did have some initial GI complications including changed bowel habit leading to hemorhoids.   Current Medications (verified): 1)  Nexium 40 Mg Cpdr (Esomeprazole Magnesium) .... Take 1 Capsule By Mouth Every Morning 2)  Pravastatin Sodium 40 Mg Tabs (Pravastatin Sodium) .Marland Kitchen.. 1 By Mouth At Bedtime 3)  Diovan 320 Mg  Tabs (Valsartan) .Marland Kitchen.. 1 By Mouth At Bedtime 4)  Magnesium   Caps (Magnesium Caps) .Marland Kitchen.. 1 By Mouth Every Am 5)  Robitussin Chest Congestion 100 Mg/51ml  Syrp (Guaifenesin) .... As Needed 6)  Klor-Con M20 20 Meq  Tbcr (Potassium Chloride Crys Cr) .... Take 1 Tablet By Mouth Once A Day 7)  Warfarin Sodium 5 Mg Tabs (Warfarin Sodium) .... Take 1 Tablet By Mouth Once A Day or As Directed 8)  C-Pap Machine .... At Night 9)  Tikosyn 500 Mcg Caps (Dofetilide) .Marland Kitchen.. 1 Tab By Mouth Every 12 Hours 10)  Pradaxa 150 Mg Caps (Dabigatran Etexilate Mesylate) ....  Take 1 Capsule Two Times A Day  Allergies (verified): No Known Drug Allergies  Past History:  Past Medical History: ATRIAL FIBRILLATION (ICD-427.31) ATRIAL FLUTTER (ICD-427.32) HYPERTENSION (ICD-401.9) HYPERLIPIDEMIA (ICD-272.4) DYSPNEA (ICD-786.05) POISN ELECTROLYTIC CALORIC&WATER-BALANCE AGTS (ICD-974.5) AODM (ICD-250.00) HYPERGLYCEMIA (ICD-790.6) * [facility]CC OF NOSE, LEFT * 2% PERMANENT PARTIAL IMPAIRMENT, RIGHT KNEE GASTRIC ULCER/HIATAL HERNIA (ICD-531.90) DIVERTICULOSIS, COLON (ICD-562.10) ALLERGIC RHINITIS (ICD-477.9) Hearing loss - uses amplification  physician Roster                 EP- Dr. Graciela Husbands, Dr. Deno Lunger [facility])              cardiologist - Dr. Daleen Squibb               Optomistrist - Dr. Marella Bile - Dr. Priscille Kluver               GI- Dr. Jarold Motto.    Past Surgical History: 03/12/99      Mammo (-) 07/29/00    Mammo (-)  F/U 11/02 (-) 12/13/00        Colonoscopy, diverticulosis    5 year F/U 3/02  EGD, dilated  (-) Barrett's       - [facility] 08/18/04      Echo, mild LAE TR MR, T.R. 09/23/04    GARD Stress Myoview - nml. except ? prior scar,  left VE good motion 12/30 - 10/11/04  [facility] dizziness, bradycardia with pauses, numbness 10/10/04    CT Head - min. diffuse cerebral  and cerebellar atrophy 10/28/04      Cardioversion attempt - unsuccessful 11/17/04        Cardioversion on Rhythmol 5/22 - 03/05/05  [facility], recurrent symptoms of A. Fib. 07/13/05      DC cardioversion  sinus (Tikosyn) 09/01/05    [facility], A. Fib., D/C home    Assess due to anxiety, stress of UC 09/10/05    DC cardioversion    sinus 10/22/05      Cardio CT  - wnl except calcification in all three main coronary arteries 5/4-02/13/06  [facility]  A. Fib. - cardiovert  NSR 03/27/06      [facility]  EPS stay - Ablation 09/01/07    Mammo nml SE Radiology 3/5-12/16/07  HOSP [facility] Afib s/p Ablation 09/04/08    Mammo nml SE Radiology 02/13/09        Diag Electrophys Study Complex Supraventr Tachy Ablation (Dr Deno Lunger,  [facility])  9/14-9/15/10  HOSP ([facility]) SOB DOE Spiral CT Cardiovasc MRI  06/25/09      Spiral CT LLL Nodule (Repeat 6 mos) Neg PE Mild Emphys 06/25/09      Cadiovasc MRI Nml Uvulopalatectomy - '80's Vasectomy '70's- with complications  Family History: Father: Dec. 90 Stroke Bladder Ca Mother: Dec. 89 MI Htn  Breast Ca  Brother dec 1 CAD, A.Fib Brother A 82 Theodoro Grist) Breast Cancer Brother A 71 (Tim) Afib  VV Tremors  Stroke Daughter:  Lobbyist ablation  Social History: [employer] - [place], [place]. 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 He does not want heroic or futile care especially if he cannot communicate, read and enjoy his loved ones.   Alcohol use-yes, occasional Drug use-no  Review of Systems       The patient complains of vision loss and decreased hearing.  The patient denies fever, weight loss, weight gain, chest pain, syncope, dyspnea on exertion, peripheral edema, abdominal pain, incontinence, suspicious skin lesions, unusual weight change, and enlarged lymph nodes.    Physical Exam  General:  overweight white male in no distress Head:  normocephalic and atraumatic.   Eyes:  C&S clear. Further exam deferred to optometry and opthalmology Ears:  bilateral hearing aids - after removal cerument build up noted. Needs irrigation Lungs:  normal respiratory effort and normal breath sounds.   Heart:  normal rate, regular rhythm, no gallop, and no JVD.   Abdomen:  soft and normal bowel sounds.   Msk:  no redness over joints and no joint instability.   Pulses:  2+ radial Neurologic:  alert & oriented X3 and gait normal.   Skin:  turgor normal and color  normal.   Psych:  Oriented X3, normally interactive, good eye contact, and not anxious appearing.     Impression & Recommendations:  Problem # 1:  ATRIAL FIBRILLATION (ICD-427.31) See HPI. He is doing  well and followed closely by the EP service   His updated medication list for this problem includes:    Warfarin Sodium 5 Mg Tabs (Warfarin sodium) .Marland Kitchen... Take 1 tablet by mouth once a day or as directed    Tikosyn 500 Mcg Caps (Dofetilide) .Marland Kitchen... 1 tab by mouth every 12 hours  Problem # 2:  HYPERTENSION (ICD-401.9)  His updated medication list for this problem includes:    Diovan 320 Mg Tabs (Valsartan) .Marland Kitchen... 1 by mouth at bedtime  BP today: 124/82 Prior BP: 158/90 (03/25/2010)  Labs Reviewed: K+: 3.9 (03/25/2010) Creat: : 1.0 (03/25/2010)    Good control on present medications.  Problem # 3:  HYPERLIPIDEMIA (ICD-272.4)  His updated medication list for this problem includes:    Pravastatin Sodium 40 Mg Tabs (Pravastatin sodium) .Marland Kitchen... 1 by mouth at bedtime  Labs Reviewed: SGOT: 25 (02/26/2010)   SGPT: 28 (02/26/2010)   HDL:47.80 (02/26/2010), 53.20 (02/28/2009)  LDL:80 (02/26/2010), 74 (02/28/2009)  Chol:134 (02/26/2010), 135 (02/28/2009)  Trig:29.0 (02/26/2010), 41.0 (02/28/2009)  Great control based on most recent labs. He will continue present meds and have follow-up labs May 2012  Problem # 4:  AODM (ICD-250.00)  His updated medication list for this problem includes:    Diovan 320 Mg Tabs (Valsartan) .Marland Kitchen... 1 by mouth at bedtime  Labs Reviewed: Creat: 1.0 (03/25/2010)   Microalbumin: 4.7 (10/12/2006)  Last Eye Exam: normal (06/26/2009) Reviewed HgBA1c results: 6.3 (02/26/2010)  6.3 (09/02/2009)  Good control of diabetes based on last labs. Continue diet management.   Problem # 5:  Preventive Health Care (ICD-V70.0) Up-to-date with colorectal cancer screening due for f/u in 2012, prostate screening, routine labs. Immunizations: shingels in June '10. Appears to be  due pneumonia vaccine and tetnus booster.  In summary - a nice man who is accepted in transfer from Dr. Hetty Ely. He will return as needed or in Spring of '12 for annual exam.   Complete Medication List: 1)  Nexium 40 Mg Cpdr (Esomeprazole magnesium) .... Take 1 capsule by mouth every morning 2)  Pravastatin Sodium 40 Mg Tabs (Pravastatin sodium) .Marland Kitchen.. 1 by mouth at bedtime 3)  Diovan 320 Mg Tabs (Valsartan) .Marland Kitchen.. 1 by mouth at bedtime 4)  Magnesium Caps (Magnesium caps) .Marland Kitchen.. 1 by mouth every am 5)  Robitussin Chest Congestion 100 Mg/21ml Syrp (Guaifenesin) .... As needed 6)  Klor-con M20 20 Meq Tbcr (Potassium chloride crys cr) .... Take 1 tablet by mouth once a day 7)  Warfarin Sodium 5 Mg Tabs (Warfarin sodium) .... Take 1 tablet by mouth once a day or as directed 8)  C-pap Machine  .... At night 9)  Tikosyn 500 Mcg Caps (Dofetilide) .Marland Kitchen.. 1 tab by mouth every 12 hours 10)  Pradaxa 150 Mg Caps (Dabigatran etexilate mesylate) .... Take 1 capsule two times a day 11)  Anusol-hc 25 Mg Supp (Hydrocortisone acetate) .Marland Kitchen.. 1 pr two times a day x 6 days for internal hemorrhoid Prescriptions: ANUSOL-HC 25 MG SUPP (HYDROCORTISONE ACETATE) 1 pr two times a day x 6 days for internal hemorrhoid  #12 x 1   Entered and Authorized by:   Jacques Navy MD   Signed by:   Jacques Navy MD on 06/01/2010   Method used:   Electronically to        [facility] # [phone]* (retail)       [address]       [place], [place]  [address]       Ph: [phone]  Fax: [phone]   RxID:   [id number]

## 2010-11-11 NOTE — Letter (Signed)
Summary: EP Clinic/[facility]  [facility]   Imported By: Lester  09/12/2010 09:43:59  _____________________________________________________________________  External Attachment:    Type:   Image     Comment:   External Document

## 2010-11-13 NOTE — Letter (Signed)
Summary: Guy Moore EP Return Patient   [facility] EP Return Patient   Imported By: Roderic Ovens 09/30/2010 11:36:38  _____________________________________________________________________  External Attachment:    Type:   Image     Comment:   External Document

## 2010-11-13 NOTE — Consult Note (Signed)
Summary: EP Return Patient / [facility]  EP Return Patient / [facility]   Imported ByLennie Odor 10/29/2010 14:37:50  _____________________________________________________________________  External Attachment:    Type:   Image     Comment:   External Document

## 2010-11-13 NOTE — Letter (Signed)
Summary: [facility] - EP Return  [facility] - EP Return   Imported By: Marylou Mccoy 11/03/2010 17:08:48  _____________________________________________________________________  External Attachment:    Type:   Image     Comment:   External Document

## 2010-11-13 NOTE — Letter (Signed)
Summary: Guy Moore EP Return Patient   [facility] EP Return Patient   Imported By: Roderic Ovens 09/30/2010 11:35:14  _____________________________________________________________________  External Attachment:    Type:   Image     Comment:   External Document

## 2010-11-25 ENCOUNTER — Telehealth: Payer: Self-pay | Admitting: Cardiology

## 2010-12-03 NOTE — Progress Notes (Signed)
Summary: rx refill  Medications Added PRADAXA 150 MG CAPS (DABIGATRAN ETEXILATE MESYLATE) Take 1 capsule two times a day       Phone Note From Pharmacy Call back at [phone]   [facility] Summary of Call: pharmacy needs a new rx for a  90 days supply with 3 refills for  pradaxa. Fax# [phone]. FAO#[id number] Initial call taken by: Roe Coombs,  November 25, 2010 1:01 PM    New/Updated Medications: PRADAXA 150 MG CAPS (DABIGATRAN ETEXILATE MESYLATE) Take 1 capsule two times a day Prescriptions: PRADAXA 150 MG CAPS (DABIGATRAN ETEXILATE MESYLATE) Take 1 capsule two times a day  #180 x 3   Entered by:   Danielle Rankin, CMA   Authorized by:   Gaylord Shih, MD, [facility]   Signed by:   Danielle Rankin, CMA on 11/25/2010   Method used:   Faxed to ...       [facility] Environmental education officer)       [address] [address]       [place], [place]  [address]       Ph: [phone]       Fax: [phone]   RxID:   [id number]

## 2011-02-24 NOTE — Assessment & Plan Note (Signed)
[facility]                            CARDIOLOGY OFFICE NOTE   GURTAJ, RUZ                      MRN:          [id number]  DATE:04/13/2007                            DOB:          01-17-1934    [name] returns today for further management of paroxysmal atrial  fibrillation. He has had no recurrences. He just finished a  transcontinental trip of 12,000 miles in 8 weeks with his wife. They had  a delightful time, and he has no cardiovascular problems.   His blood pressure has been up and down but usually runs around 135 in  the morning and runs about 120 in the evening.   He is to follow up at [facility] for his atrial fibrillation ablation in a  couple of weeks. He is no longer on Coumadin, being on aspirin 325 a  day. He no longer takes any antiarrhythmics. What a wonderful outcome!   The rest of his medications are unchanged.   His blood pressure is 153/83, pulse 66 and regular.  HEENT:  Unchanged. Carotid upstrokes were equal bilaterally without  bruits. No JVD. Thyroid is not enlarged. Trachea is midline.  LUNGS:  Were clear.  HEART:  Reveals a soft S1/S2. PMI could not be appreciated.  ABDOMINAL EXAM:  Protuberant with good bowel sounds.  EXTREMITIES:  Revealed no edema. Pulses were intact.  NEUROLOGIC EXAM:  Intact.   [name] is doing well. I have made no changes in his program. I  encouraged him to loose weight. I will see him back again in 6 months.     Thomas C. Daleen Squibb, MD, [facility]  Electronically Signed    TCW/MedQ  DD: 04/13/2007  DT: 04/14/2007  Job #: [id number]

## 2011-02-24 NOTE — Assessment & Plan Note (Signed)
[facility]                            CARDIOLOGY OFFICE NOTE   Guy Moore, Guy Moore                      MRN:          [id number]  DATE:11/17/2007                            DOB:          08-12-1934    [name] returns today after having transesophageal echo with  cardioversion by Dr. Charlton Haws.   He converted to sinus rhythm.  In fact he was in sinus brady.   He comes today with a viral URI.  He saw Dr. Verdell Face the other day  who put him on guaifenesin and fluids.  He has gained a few pounds of  fluid and just feels lousy.  He is having fever.  He has a dry cough.   His medicines are metoprolol extended release 25 mg a day, guaifenesin 2  tablespoons b.i.d., Rythmol SR 225 mg b.i.d., Nexium 40 mg day,  pravastatin 4 mg a day, warfarin as directed, Diovan 320 mg a day.   His blood pressure is 151/77, pulse 58.  He is in sinus brady.  His PR  interval is normal.  QRS QTC are normal.  Weight is 262, up 5.  He is in  no acute distress.  Respirations 18.  His skin is warm and dry.  He does not feel febrile.  HEENT:  Unchanged.  Carotids are equal bilateral bruits, no JVD.  Thyroid is not enlarged.  Trachea is midline.  Lungs were clear to auscultation posteriorly.  There is no rub.  HEART:  Reveals poorly appreciated PMI, slow rate and rhythm with normal  S1-S2.  ABDOMEN:  Exam is protuberant, good bowel sounds.  There is no edema.  Pulses are intact.   Guy Moore is maintaining sinus rhythm.  We had a long conversation day  about the viral illness and I told him to stay with the current game  plan.  I am reluctant to put him on any antibiotics since there is no  sign of pneumonia on exam.  It is most likely viral.  In addition it  would alter is anticoagulation.   I have advised him to undergo another ablation attempt by Dr. Deno Lunger  at [facility].  He has  a scheduled appointment with him soon.     Thomas C. Daleen Squibb, MD, [facility]  Electronically Signed    TCW/MedQ  DD: 11/17/2007  DT: 11/18/2007  Job #: [id number]

## 2011-02-24 NOTE — Assessment & Plan Note (Signed)
[facility]                            CARDIOLOGY OFFICE NOTE   Guy Moore, Guy Moore                      MRN:          [id number]  DATE:10/12/2007                            DOB:          September 29, 1934    Mr. Preble returns today for post ER visit follow-up.  He went on  Christmas eve with palpitations.  EKG, which I have retrieved, looks  like atrial fib with a rate of about 85 beats per minute.   His Chem-7 and CBC were normal.  Chest x-ray was stable.   His medicines were changed in that warfarin was added.  He is followed  with his protimes here in our clinic.  He has called [facility] and he has an  appointment with Dr. Deno Lunger on January 14.   This morning he felt better and felt like he was not having as much  palpitations.   MEDICATIONS:  1. Diovan 320 mg a day.  2. Potassium 20 mEq p.o. p.r.n.  3. Warfarin as directed.  4. Pravastatin 40 mg p.o. daily.  5. Nexium 40 mg a day.   PHYSICAL EXAMINATION:  His blood pressure is 142/80, his pulse is 90 and  irregular.  His EKG looks like an a sinus rhythm with frequent PACs  today.  His weight is 254.  HEENT:  Unchanged.  Carotid upstrokes are equal bilaterally without  bruits, no JVD.  Thyroid is not enlarged.  Trachea is midline.  LUNGS:  Clear.  HEART:  Reveals an irregular rate and rhythm.  ABDOMEN:  Protuberant with good bowel sounds.  EXTREMITIES:  Reveal no edema.  Pulses are intact.  NEUROLOGIC:  Intact.   ASSESSMENT:  Recurrent atrial fibrillation status post pulmonary vein  ablation.   PLAN:  I placed him on metoprolol succinate 50 mg p.o. daily to slow his  rate and also to perhaps help with the symptomatic palpitations.  I  reinforced him to go [facility] on January 14 and  to undergo this procedure again if they felt like there was improved  success.  He will have a protime checked today in the Coumadin clinic.     Thomas C. Daleen Squibb, MD, [facility]  Electronically Signed    TCW/MedQ   DD: 10/12/2007  DT: 10/12/2007  Job #: [id number]   cc:   Bedelia Person, MD  Arta Silence, MD

## 2011-02-24 NOTE — Assessment & Plan Note (Signed)
[facility]                            CARDIOLOGY OFFICE NOTE   Guy Moore, Guy Moore                      MRN:          [id number]  DATE:11/02/2007                            DOB:          [birth year]    Mr. Brubacher is a delightful 77 year old gentleman who has paroxysmal  atrial fibrillation.  He is status post atrial fibrillation ablation in  June 2007 at [facility].  He did well for number of  months until he went back into atrial fib October 04, 2007.   He was seen by Dr. Deno Lunger on October 26, 2007 at [facility].  It was  suggested at that time he be placed on Rythmol SR 225 mg p.o. b.i.d.  which has been accomplished.  It was then suggested to have a  cardioversion with me on October 31, 2007.  Unfortunately, his INR is  only been therapeutic for a little over  2 weeks.  His first therapeutic  INR we have was 3.3 which was a little bit high on June 7.  He had an  INR earlier this week which was 2.4.  He needs another week of  anticoagulation.   After his cardioversion, he will be followed up at [facility] and probably  undergo ablation once again.   He remains in atrial fib despite being on Rythmol.   OTHER MEDICATIONS:  1. Diovan 320 mg a day.  2. Potassium 20 mEq b.i.d.  3. Warfarin as directed.  4. Pravastatin 40 mg a day.  5. Nexium 40 mg day.  6. Metoprolol extended release 50 mg a day.  7. Rythmol SR 225 mg p.o. b.i.d.   PHYSICAL EXAMINATION:  Today, is extremely pleasant and jocular as  usual.  His blood pressure 130/74, pulse 76 and irregular.  Weight is  257.  HEENT:  Normocephalic, atraumatic.  PERRLA.  Extraocular movements are  intact.  Sclerae clear.  Facial symmetry is normal.  Dentition  satisfactory.  Neck is supple.  Carotid upstrokes are equal bilaterally without bruits,  no JVD.  Thyroid is not enlarged.  Trachea is midline.  Lungs were clear.  HEART:  Reveals a poorly appreciated PMI and  is in an  irregular rate  and rhythm.  No gallop.  ABDOMEN: Is protuberant, good bowel sounds.  Organomegaly was difficult  to assess.  EXTREMITIES:  No edema.  Pulses are intact.  NEURO:  Exam is intact.  Skin is unremarkable except for few ecchymoses.   I had a long talk with Mr. Bisono today.  He will be therapeutic on his  anticoagulation as of the 27th.  He would like to have this done on the  Wednesday the 28th.  I am not in the hospital that day, but Dr. Nicholes Mango is and he is happy with this set up.   I will plan on seeing him back after he has been to [facility] for possible re-  ablation.     Thomas C. Daleen Squibb, MD, [facility]  Electronically Signed    TCW/MedQ  DD: 11/02/2007  DT: 11/02/2007  Job #: [id number]  cc:   Arta Silence, MD  Bedelia Person, MD

## 2011-02-27 NOTE — Consult Note (Signed)
Guy Moore, Guy Moore NO.:  [id number]   MEDICAL RECORD NO.:  [id number]          PATIENT TYPE:  INP   LOCATION:  3710                         FACILITY:  [facility]   PHYSICIAN:  Kenton Bing, M.D.  DATE OF BIRTH:  1934-08-16   DATE OF CONSULTATION:  03/05/2005  DATE OF DISCHARGE:                                   CONSULTATION   PROCEDURE:  Cardioversion.   REFERRING PHYSICIAN:  Jesse Sans. Wall, M.D.   1.  Anesthesia administered 250 mg of intravenous Pentothal.  2.  AP pads used to deliver a 200-joule synchronized shock; atrial      fibrillation persistent.  A second 200 J discharge resulted in      conversion to sinus bradycardia with a two-second pause prior to the      resumption of sinus rhythm.  3.  The patient tolerated procedure well.      RR/MEDQ  D:  03/05/2005  T:  03/05/2005  Job:  [id number]

## 2011-02-27 NOTE — Consult Note (Signed)
Guy Moore NO.:  [id number]   MEDICAL RECORD NO.:  [id number]          PATIENT TYPE:  EMS   LOCATION:  MAJO                         FACILITY:  [facility]   PHYSICIAN:  Marlan Palau, M.D.  DATE OF BIRTH:  August 13, 1934   DATE OF CONSULTATION:  10/10/2004  DATE OF DISCHARGE:                                   CONSULTATION   HISTORY OF PRESENT ILLNESS:  Guy Moore is a 77 year old right-handed  white male born 20-Jan-1934, with a history of atrial fibrillation  diagnosed in November 2005, recently placed on Coumadin, a history of  hypertension with a recent diagnosis.  The patient had been treated with  Diovan and atenolol, has had some problems with diarrhea and dizziness of a  postural nature.  The patient comes into [facility] today with  onset of diarrhea, low-grade temperatures that began three or four days  prior to this evaluation.  The patient today was in the shower, noted severe  onset of dizziness, had left upper extremity numbness associated with this,  no reports of chest pain or shortness of breath.  The patient has had some  improvement of the numbness that initially went up to the elbow but now is  mainly involving the hand on the left.  The patient felt that there may be  some slight weakness with that arm.  Denied any significant gait imbalance,  no blackout episodes, generalized fatigue sensation was also noted.  CT of  the head was done and was unremarkable.  INR was 4.0.  Neurology was asked  to see this patient for further evaluation.   PAST MEDICAL HISTORY:  1.  Episode of left arm numbness and dizziness today, rule out TIA or      stroke.  2.  Recent diarrhea.  3.  Atrial fibrillation, on Coumadin.  4.  Sleep apnea.  5.  Resection of the uvula for sleep apnea.  6.  History of esophageal stricture, status post dilation therapy.  7.  History of hypertension.   Medications prior to this admission included:  1.   Diovan 80/12.5 mg one daily.  2.  Aciphex 20 mg a day.  3.  Atenolol 25 mg a day.  4.  Coumadin.   The patient has no known allergies.   Does not smoke or drink.   SOCIAL HISTORY:  This patient is married, has two children who are alive and  well.  Works as a Chief Operating Officer.  Again, lives in the [place] area.   FAMILY MEDICAL HISTORY:  Of note, the mother died at age 28 of congestive  heart failure and cancer.  Father died at age 54, unknown cause.  The  patient has three brothers.  One died following an accidental death.  One  brother is alive with atrial fibrillation, history of stroke.  One brother  is alive, has a history of breast cancer.   REVIEW OF SYSTEMS:  Notable for some occasional fevers, recently with  diarrhea.  Denies neck pain, shortness of breath, chest pain, palpitations.  Denies any abdominal pain, trouble controlling  the bowels or bladder other  than the diarrhea.  Denies any visual field changes, speech changes.  The  patient has not had any blackout episodes.   PHYSICAL EXAMINATION:  VITAL SIGNS:  Blood pressure is 134/78, heart rate  74, respiratory rate 20, temperature afebrile.  GENERAL:  This patient is a fairly well-developed white male who is alert  and cooperative at the time of examination.  HEENT:  Head is atraumatic.  Eyes:  Pupils equal, round, and reactive to  light.  Discs are flat bilaterally.  NECK:  Supple, no carotid bruits noted.  RESPIRATORY:  Clear.  CARDIOVASCULAR:  Regular rate and rhythm, no obvious murmurs or rubs noted.  EXTREMITIES:  Without significant edema.  NEUROLOGIC:  Cranial nerves as above.  Facial symmetry is present.  The  patient has good sensation of the face to pinprick and soft touch  bilaterally.  He has good strength of the facial muscles and the muscles of  head turning and shoulder shrug bilaterally.  Speech is well-enunciated and  not aphasic.  Motor testing reveals 5/5 strength in all fours.  Good  symmetric  motor tone is noted throughout.  Sensory testing is intact to  pinprick, soft touch, vibratory sensation throughout.  The patient has good  finger-nose-finger, toe-to-finger bilaterally.  No drift is seen.  Deep  tendon reflexes are symmetric and normal.  Toes downgoing bilaterally.   LABORATORY DATA:  Notable for a sodium of 141, potassium 3.4, chloride of  104, BUN of 15, glucose of 104.  Hematocrit of 42.0, hemoglobin of 14.3.  Creatinine 1.0.   CT of the head is as above.   IMPRESSION:  1.  New onset of dizziness, left arm numbness.  Rule out transient ischemic      attack or stroke.  2.  Atrial fibrillation.  3.  Hypertension.   This patient at this point has a normal objective examination.  The patient  still feels as if the left hand is somewhat numb.  Gives a history of  intermittent numbness to the left hand upon awakening over the last month or  so, could have early carpal tunnel syndrome, but symptoms today are somewhat  different from what one usually experiences.  Will pursue a bit further  workup at this point.  INR is actually too high.  Coumadin will need to be  readjusted.   PLAN:  1.  Will obtain an MRI scan of the brain.  2.  MRI angiogram of the intracranial and extracranial vessels.  3.  Coumadin readjustment.  4.  Will follow the patient's clinical course while in house.       CKW/MEDQ  D:  10/10/2004  T:  10/10/2004  Job:  [id number]   cc:   [facility]

## 2011-02-27 NOTE — Op Note (Signed)
NAMEDIOGENES, WHIRLEY NO.:  [id number]   MEDICAL RECORD NO.:  [id number]          PATIENT TYPE:  OIB   LOCATION:  2899                         FACILITY:  [facility]   PHYSICIAN:  Jesse Sans. Wall, M.D.   DATE OF BIRTH:  06/20/1934   DATE OF PROCEDURE:  11/17/2004  DATE OF DISCHARGE:                                 OPERATIVE REPORT   After informed consent and appropriate labs were obtained including an INR  of 3.2, patient underwent DC cardioversion.  There were 100 mg of Pentothal  given by Dr. Diamantina Monks.  There were 150 joules of biphasic current  administered.  The patient converted to sinus bradycardia at a rate of 52  beats per minute.  There were no complications.  The patient is now awake  with no complaints.  EKG shows sinus bradycardia at a rate of 53 beats per  minute.  He has a first degree AV block with PR interval of 208 msec.  Acute  RS is normal as is his QTC.   PLAN:  1.  Observe for a couple of hours and discharge  home.  2.  Continue current medications including Rhythmol.  3.  Follow up with Dr. Daleen Squibb in two weeks.  4.  Continue anticoagulation.  INR to be followed up in the office.      TCW/MEDQ  D:  11/17/2004  T:  11/17/2004  Job:  [id number]   cc:   Laurita Quint, M.D.  [address] [place]  [place] [address]  Fax: [phone]

## 2011-02-27 NOTE — Op Note (Signed)
NAMEJOHNE, BUCKLE NO.:  [id number]   MEDICAL RECORD NO.:  [id number]          PATIENT TYPE:  OIB   LOCATION:  2899                         FACILITY:  [facility]   PHYSICIAN:  Jesse Sans. Wall, M.D.   DATE OF BIRTH:  01/24/1934   DATE OF PROCEDURE:  09/10/2005  DATE OF DISCHARGE:  09/10/2005                                 OPERATIVE REPORT   PROCEDURE:  Direct-current cardioversion.   ATTENDING:  Jesse Sans. Wall, M.D.   DESCRIPTION OF PROCEDURE:  After informed consent, Mr. Belluomini was  anesthetized with 350 mg of IV Pentothal; this was done by Dr. Sharee Holster of Anesthesia.   He received 150 joules of biphasic current utilizing chest pads with  conversion to sinus rhythm.   There were no complications.   PLAN:  1.  Continue current medications.  2.  Follow up with the office in 3 or 4 weeks.      Thomas C. Wall, M.D.  Electronically Signed     TCW/MEDQ  D:  09/10/2005  T:  09/11/2005  Job:  [id number]   cc:   Laurita Quint, M.D.  Fax: [phone]

## 2011-02-27 NOTE — Discharge Summary (Signed)
Guy Moore, Guy Moore NO.:  [id number]   MEDICAL RECORD NO.:  [id number]          PATIENT TYPE:  INP   LOCATION:  2039                         FACILITY:  [facility]   PHYSICIAN:  Charlton Haws, M.D.     DATE OF BIRTH:  [birth year]   DATE OF ADMISSION:  10/10/2004  DATE OF DISCHARGE:  10/11/2004                                 DISCHARGE SUMMARY   DISCHARGE DIAGNOSES:  1.  Postural dizziness.      1.  Bradycardia noted on telemetry with pauses - beta blocker decreased.  2.  Supratherapeutic INR.      1.  Coumadin held and dosage adjusted at discharge.  3.  Subconjunctival hemorrhage - stable at discharge.  4.  Transient left upper extremity numbness.      1.  Neurology consult this admission - MRI negative for acute          cerebrovascular accident; MRA shows moderate stenosis of the left          PCA; otherwise normal - no further work-up necessary.  5.  Hypertension.  6.  Atrial fibrillation.      1.  Long-term Coumadin therapy.  7.  Obesity.  8.  Dyslipidemia.  9.  Diverticulosis.   CONSULTATIONS THIS ADMISSION:  Neurology.   HOSPITAL COURSE:  Please see the admission history and physical for complete  details.  Briefly, this 77 year old male patient is followed by Dr. Daleen Squibb  with a history of atrial fibrillation on chronic Coumadin therapy, who  presented to the emergency room at [facility] with complaints of  postural dizziness.  He also noted left eye redness, which was consistent  with subconjunctival hemorrhage.  The patient's INR was noted to be elevated  in our office at 4.  He also described left arm numbness, which was unusual  for him.  The patient was admitted and was gently hydrated.  Neurology was  asked to see the patient.  Dr. Anne Hahn saw the patient.  It was felt that TIA  needed to be ruled out.  The patient underwent MRI and MRA of the brain.  Follow up on October 11, 2004 by Dr. Anne Hahn was notable for a negative MRI.  MRA showed some  moderate stenosis of the left PCA; otherwise, okay.  Dr.  Anne Hahn felt it was not clear if the left arm numbness was a TIA or not, but  he felt is was okay for the patient to be discharged to home from a  neurologic standpoint with no plans for further work-up.  Dr. Gerri Spore saw  the patient on October 11, 2004.  He noted on telemetry the patient had  decreased heart rates in the upper 40s, and 2.5 second pauses.  He  recommended decreasing the patient's atenolol to 12.5 mg daily.  The  patient's INR was better at 3.4, and Dr. Gerri Spore recommended restarting  Coumadin 5 mg tomorrow, alternating with 7.5 mg.  The patient will be set up  for follow up with Dr. Daleen Squibb in the office sometime in the next couple of  weeks.   LABORATORY DATA:  White count 5800, hemoglobin 14.3, hematocrit 42, platelet  count 159,000.  INR of 3.4 at discharge.  Sodium 141, potassium 3.3,  chloride 106, CO2 of 26, glucose 85, BUN 13, creatinine 0.9, total bilirubin  0.7.  Alkaline phosphatase 35, AST of 32, ALT of 33.  Total protein 6.2,  albumin 3.4, calcium 8.4.  Cardiac enzymes negative x3.  Total cholesterol  104, triglycerides 59, HDL 35, LDL 57.  TSH of 1.107.  MRI and MRA as noted  above.  Chest x-ray on admission showed borderline cardiomegaly, no acute  abnormality.  Head CT on admission showed minimal diffuse cerebral and  cerebellar atrophy.  No acute abnormality.   DISCHARGE MEDICATIONS:  1.  Atenolol 25 mg half tablet a day.  2.  Coumadin 5 mg daily starting tomorrow, alternating with 7.5 mg every      other day.  3.  Aciphex 20 mg daily.  4.  Tylenol p.r.n.  5.  Diovan/HCT 80/12.5 mg daily.  6.  K-Dur 20 mEq daily - new this admission for hypokalemia.   PAIN MANAGEMENT:  Tylenol as needed.   ACTIVITY:  As tolerated.   DIET:  Low fat, low sodium.   FOLLOW UP:  The patient will need to see our Coumadin Clinic on Wednesday,  October 15, 2004, for follow up on his INR.  We will also need a BMET  at that  appointment.  He will need to follow up with Dr. Daleen Squibb in the next 2-3 weeks,  and the office will contact him with an appointment.       SW/MEDQ  D:  10/11/2004  T:  10/11/2004  Job:  [id number]   cc:   Laurita Quint, M.D.  [address] [place]  [place] [address]  Fax: [phone]   Jesse Sans. Wall, M.D.   Marlan Palau, M.D.  [address]  [address]  [place]  [place] [address]  Fax: [phone]

## 2011-02-27 NOTE — Consult Note (Signed)
NAMEWELLES, WALTHALL NO.:  [id number]   MEDICAL RECORD NO.:  [id number]          PATIENT TYPE:  EMS   LOCATION:  MAJO                         FACILITY:  [facility]   PHYSICIAN:  Arvilla Meres, M.D. LHCDATE OF BIRTH:  [birth year]   DATE OF CONSULTATION:  09/01/2005  DATE OF DISCHARGE:  09/01/2005                                   CONSULTATION   PRIMARY CARE PHYSICIAN:  Laurita Quint, M.D.   CARDIAC ASSESSMENT:  Jesse Sans. Wall, M.D.   REASON FOR CONSULTATION:  Recurrent symptomatic atrial fibrillation.   PATIENT IDENTIFICATION/HISTORY OF PRESENT ILLNESS:  [name] is a  delightful 77 year old male with a history of paroxysmal atrial fibrillation  who presents today for recurrent symptomatic atrial fibrillation.  In  reviewing his records, he was previously on high-dose propafenone but failed  due to persistent atrial fibrillation.  In October of 2006, he was switched  to Tikosyn and underwent successful cardioversion.  Since that time, he has  maintained a sinus rhythm and has been quite functional, walking for at  least 30 minutes every day without any symptoms.  He has not had any  palpitations, syncope, or presyncope.  Over the past few days, he has been  battling an upper respiratory traction infection and had some mild diarrhea  yesterday which resolved.  This morning, he woke up and felt a little weak.  On trying to stand up, he became quite presyncopal but did not lose  consciousness.  He took his pulse and found that it was irregular and bit  over 100.  As he had persistent lightheadedness, he came to the emergency  room.  On arrival to the emergency room, initial EKG showed atrial  fibrillation with a ventricular response of 96 beats per minute. Initial  blood pressure was 157/100.  He did receive some mild IV fluids and now is  feeling better without any symptoms.   REVIEW OF SYSTEMS:  As per HPI and problem list, otherwise all systems  negative.  He  has not had any bleeding on Coumadin.   PAST MEDICAL HISTORY:  1.  Atrial fibrillation.      1.  Previously failed high-dose propafenone.      2.  Loaded with Tikosyn and successful DC cardioversion in October of          2006.  2.  Sinus bradycardia while in sinus rhythm with heart rates in the 50s.  3.  Hypertension.  4.  Mild hyperlipidemia.  5.  History of esophageal stricture status post dilatation.  6.  Sleep apnea, status post UPPP.  7.  Gastroesophageal reflux disease.   CURRENT MEDICATIONS:  1.  Diovan 160 a day.  2.  Potassium 20 b.i.d.  3.  Coumadin.  4.  Aciphex.   ALLERGIES:  He has no known drug allergies.   SOCIAL HISTORY:  He is married.  He has five children.  He was formerly a  Chief Operating Officer for a business. He does not smoke cigarettes, quitting at the  age of 77.  He does not drink significant alcohol.   FAMILY HISTORY:  His  mother died at 47 due to congestive heart failure and  cancer.  Father died at 45 due to unknown causes.  He has three brothers.  One died during an accidental death.  Another brother is alive with atrial  fibrillation and history of stroke.  One brother is alive with a history of  breast cancer.   PHYSICAL EXAMINATION:  GENERAL:  He is comfortable. He is lying flat in bed.  VITAL SIGNS:  His respirations are unlabored.  Blood pressure is 121/70,  heart rate in the 70-80 range.  He is saturating in the high 90s on 2 L of  nasal cannula.  He is afebrile.  HEENT:  Normocephalic, atraumatic.  Sclerae are anicteric.  EOMI.  There is  no xanthelasma.  Mucous membranes are moist.  There is good dentition.  NECK:  Supple, no JVD.  Carotids are 2+ bilaterally without bruits.  There  is no lymphadenopathy or thyromegaly.  CARDIAC:  He has an irregular, irregular rhythm with no obvious murmurs,  rubs, or gallops.  LUNGS:  Clear.  ABDOMEN:  Soft, nontender, nondistended.  There is no history, no bruits, no  masses.  There is good bowel sounds.   EXTREMITIES:  Warm with no cyanosis, clubbing, or edema.  Distal pulses are  2+ bilaterally.  NEUROLOGIC:  He has a very bright affect.  Cranial nerves II-XII are intact.  He moves all four extremities without difficulty.   LABORATORY DATA:  Sodium 135, potassium 3.8, chloride 103, bicarb 27, BUN  15, creatinine 0.9.  Troponin is less than 0.05.  An EKG shows atrial  fibrillation at 68 beats per minute.  There are no significant ST-T wave  changes.  QTC ranges from 407 msec to 460 msec.   ASSESSMENT AND PLAN:  Recurrent atrial fibrillation in the setting of recent  upper respiratory tract infection.  I have discussed the case with Dr.  Ladona Ridgel of EP.  He agrees that breakthroughs of atrial fibrillation on  Tikosyn especially in the setting of stress with upper respiratory tract  infection is not uncommon and do not represent a Tikosyn failure.  At this  point, we will let him go home.  We will give him a prescription for p.r.n.  Lopressor 25 mg a day for breakthrough tachycardia.  He will see Dr. Daleen Squibb  back in the office later this week.  If he remains in atrial  fibrillation after a week, one could consider repeat outpatient  cardioversion.  I  suspect in the future he may need to switch over to  amiodarone or a possible atrial fibrillation ablation, but one would like to  delay this as long as possible to avoid any potential side effects or  complications.      Arvilla Meres, M.D. [facility]  Electronically Signed     DB/MEDQ  D:  09/01/2005  T:  09/01/2005  Job:  [id number]   cc:   Laurita Quint, M.D.  Fax: [phone]   Jesse Sans. Wall, M.D.  [address]  [address]  [place]  [place] [address]

## 2011-02-27 NOTE — Op Note (Signed)
NAMELOMAX, POEHLER NO.:  [id number]   MEDICAL RECORD NO.:  [id number]          PATIENT TYPE:  INP   LOCATION:  2018                         FACILITY:  [facility]   PHYSICIAN:  Jesse Sans. Wall, M.D.   DATE OF BIRTH:  [birth year]   DATE OF PROCEDURE:  02/12/2006  DATE OF DISCHARGE:  02/13/2006                                 OPERATIVE REPORT   PROCEDURE:  Electrical DC cardioversion for paroxysmal atrial fibrillation.   LABS:  INR 2.1, potassium 4.1, magnesium 2.0.   Informed consent was obtained.  The patient has had this done before.   ANESTHESIA:  By Dr. Michelle Piper with 200 mg of Pentothal.   DC cardioversion was synchronized modality x1, 200 joules, to a normal sinus  rhythm.  There was no complications.   PLAN:  1.  A 12 lead EKG.  2.  Observation x12 hours and discharge in the morning.  3.  Atrial fibrillation ablation at [facility] scheduled      for March 29, 2006.      Thomas C. Wall, M.D.  Electronically Signed     TCW/MEDQ  D:  02/12/2006  T:  02/14/2006  Job:  [id number]   cc:   Duke Salvia, M.D.  [address]  [address]  [place]  [place] [address]

## 2011-02-27 NOTE — Discharge Summary (Signed)
NAMELISA, BLAKEMAN NO.:  [id number]   MEDICAL RECORD NO.:  [id number]          PATIENT TYPE:  INP   LOCATION:  3710                         FACILITY:  [facility]   PHYSICIAN:  Jesse Sans. Wall, M.D.   DATE OF BIRTH:  01/17/1934   DATE OF ADMISSION:  03/02/2005  DATE OF DISCHARGE:  03/05/2005                                 DISCHARGE SUMMARY   DISCHARGE DIAGNOSIS:  1. Atrial fibrillation status post cardioversion with 200 joules      synchronized, patient from atrial fibrillation to sinus brady to normal      sinus rhythm.  2. Anticoagulation therapy with Coumadin, INR 2.6 at discharge.     PAST MEDICAL HISTORY:  1. Paroxysmal atrial fibrillation.  2. Hypertension.  3. Mild hyperlipidemia.  4. Esophageal stricture status post dilatation.  5. Sleep apnea.     DISPOSITION:  The patient is being discharged to home Mar 05, 2005, status  post cardioversion after being seen by Dr. Dietrich Pates.   MEDICATIONS:  Potassium 20 mEq 1 p.o. daily, Rythmol 425 mg 1 p.o. b.i.d.,  Diovan/HCTZ 80/12.5 mg daily, aspirin 325 mg daily, Coumadin as previously  taking.   DISCHARGE INSTRUCTIONS:  Follow up with Dr. Daleen Squibb in one week.  Follow up INR  in three weeks as scheduled.  No driving for two days.  The patient is  instructed to return to the emergency department if he experiences  irregular, rapid heart rate, chest pain, or shortness of breath, otherwise,  follow up with Dr. Daleen Squibb as indicated.   HOSPITAL COURSE:  Mr. Gulino is a 77 year old Caucasian gentleman followed by  Dr. Laurita Quint and Dr. Daleen Squibb who presented to [facility] on May  22 with complaints of atrial fibrillation.  The patient had cardioversion in  January that failed, subsequently put on Rythmol.  Successful cardioversion  in February, maintained sinus rhythm since then according to the patient.  The patient presents to the emergency room  complaining of rapid pulse  without any chest discomfort or  shortness of breath.  Seen by Dr. Antoine Poche,  patient admitted and placed on telemetry unit.  Rythmol increased to 425 mg  p.o. b.i.d.  Coumadin level therapeutic.  The plan was to schedule the  patient for cardioversion after checking baseline blood work.  Cardioversion  scheduled for May 25, patient tolerated without complications, being  discharged home with the above medications.      MB/MEDQ  D:  03/05/2005  T:  03/05/2005  Job:  [id number]

## 2011-02-27 NOTE — Discharge Summary (Signed)
NAMEXAVI, TOMASIK NO.:  [id number]   MEDICAL RECORD NO.:  [id number]          PATIENT TYPE:  INP   LOCATION:  2018                         FACILITY:  [facility]   PHYSICIAN:  Olga Millers, M.D. LHCDATE OF BIRTH:  [birth year]   DATE OF ADMISSION:  02/12/2006  DATE OF DISCHARGE:  02/13/2006                           DISCHARGE SUMMARY - REFERRING   HISTORY:  Mr. Lemen is 77 year old male with a known history of atrial  fibrillation and multiple medication attempts and cardioversions to maintain  normal sinus rhythm.  He presents with a history of general malaise and  palpitations associated with dizziness and diaphoresis.  On presentation to  the emergency room, he was in atrial fibrillation.  Dr. Graciela Husbands felt that he  should be admitted for observation with cardioversion later on the day of  admission.  Mr. Consuegra emphatically states that he has been compliant with  his Tikosyn.  He did state that he has missed occasional doses of potassium.   PAST MEDICAL HISTORY:  His history is also notable for:  1.  Hypertension.  2.  Dyslipidemia.  3.  Obesity.  4.  Bradycardia secondary to beta blockers.  5.  Gastroesophageal reflux disease with esophageal stricture and      diverticulosis.  6.  OSA.  7.  Chronic anticoagulation.  8.  Osteoarthritis.   LABORATORY DATA:  The patient's weight was 111.7.  Admission H&H was 15.9  and 46.4, normal indices, platelets 162, WBCs 4.2.  Admission PT was 24 with  an INR of 2.1, PTT 49.  On Feb 13, 2006, PT was 24.5 with and INR 2.2.  Sodium 139, potassium 4.1, BUN 13, creatinine 0.9, glucose 100.  Subsequent  chemistry was unremarkable.  EKGs showed atrial fibrillation with a  ventricular rate of 72, nonspecific ST-T wave changes prior to discharge.  He was in sinus bradycardia with a ventricular rate to 49.   HOSPITAL COURSE:  Mr. Darley was admitted to the unit 2000.  He was kept  n.p.o. for anticipated cardioversion.  Cardioversion  was performed by Dr.  Daleen Squibb with 200 joules restoring normal sinus rhythm.  He stayed in the  hospital overnight for 12-hour observation post cardioversion.  By Feb 13, 2006 at 10:20, Dr. Jens Som reviewed telemetry, saw that he was maintaining  sinus rhythm and he was doing well.  Thus, he could be discharged home on  his preadmission medications with outpatient follow-up as scheduled.   DISCHARGE DIAGNOSES:  1.  Recurrent atrial fibrillation.  2.  Status post DC cardioversion restoring sinus bradycardia, normal sinus      rhythm.  History as noted below.   DISPOSITION:  He was asked to maintain a low salt, fat and cholesterol diet.  Activities were not restricted.  He was asked to continue his AcipHex  20 mg  daily, Diovan 160 mg daily, K-Dur 20 mEq b.i.d., Pravachol 40 mg q.h.s.,  Coumadin 5 mg daily, except for 7.5 mg on Monday, Wednesday and Fridays,  Tikosyn 500 mcg every 12  hours.  He was asked to bring all medications to all appointments and to  follow up as scheduled with Dr. Daleen Squibb, Dr. Graciela Husbands, as well as outpatient  schedule a PT/INR.   DISCHARGE TIME:  Less than 30 minutes.      Joellyn Rued, P.A. [facility]    ______________________________  Olga Millers, M.D. [facility]    EW/MEDQ  D:  02/13/2006  T:  02/15/2006  Job:  [id number]   cc:   Laurita Quint, M.D.  Fax: [phone]   Jesse Sans. Wall, M.D.  [address]  [address]  [place]  [place] [address]   Duke Salvia, M.D.  [address]  [address]  [place]  [place] [address]

## 2011-02-27 NOTE — Assessment & Plan Note (Signed)
[facility]                              CARDIOLOGY OFFICE NOTE   EHREN, BERISHA                      MRN:          [id number]  DATE:05/25/2006                            DOB:          04/02/1934    SUBJECTIVE:  Mr. Thede returns today for further management of his atrial  fibrillation and hypertension.  He has undergone radiofrequency ablation at  [facility] with Dr. Cathlean Cower at [facility].  He was very pleased with their service.  He has had no further  events.  He is seeing Dr. Cathlean Cower next week and may stop his Tikosyn.  He  will remain on Warfarin for now.   He has noticed that his blood pressure has increased here recently.  He is  trying to watch his diet which he had gotten a little liberal with.   He is currently on Diovan 160 a day for that, Pravachol 40 at bedtime,  Tikosyn 500 mcg b.i.d., AcipHex 20 mg, Warfarin as directed and potassium 20  mEq b.i.d.   PHYSICAL EXAMINATION:  VITAL SIGNS:  His blood pressure today is 150/85,  pulse 59 and regular.  His weight is 249.  NECK:  Carotids are equal bilaterally without bruits.  No JVD.  Thyroid is  not enlarged.  LUNGS: Clear.  HEART: Regular rate and rhythm.  EXTREMITIES: No edema.  Pulses are brisk.   Delighted how Mr. Gearheart has responded to radiofrequency ablation.  Hopefully  he can come off his Tikosyn.  At the present time I would agree with Dr.  Cathlean Cower that he needs to stay on Coumadin perhaps indefinitely.  I will  leave this to his expertise.  I will see Mr. Buda back in three months.                               Thomas C. Daleen Squibb, MD, [facility]    TCW/MedQ  DD:  05/25/2006  DT:  05/26/2006  Job #:  [id number]

## 2011-02-27 NOTE — H&P (Signed)
NAMEDAYLAN, JUHNKE NO.:  [id number]   MEDICAL RECORD NO.:  [id number]          PATIENT TYPE:  INP   LOCATION:  1827                         FACILITY:  [facility]   PHYSICIAN:  Rollene Rotunda, M.D.   DATE OF BIRTH:  [birth year]   DATE OF ADMISSION:  03/02/2005  DATE OF DISCHARGE:                                HISTORY & PHYSICAL   PRIMARY CARE PHYSICIAN:  Laurita Quint, M.D.   REASON FOR ADMISSION:  Evaluate the patient with atrial fibrillation.   HISTORY OF PRESENT ILLNESS:  The patient is a very pleasant 77 year old  gentleman with persistent atrial fibrillation. His cardiac workup has  included a Cardiolite by Dr. Maisie Fus C. Wall which report negative for  ischemia. Echocardiogram with normal EF. He had a cardioversion in January  that failed. He was subsequently put on Rythmol initially it looked 225 mg  twice a day and then 325 mg twice a day. He had successful cardioversion in  February. Apparently, he has maintained sinus rhythm. However, today while  he was out doing a little walking, he developed significant lightheadedness  and dizziness. He is quite fatigue. These were symptoms similar to his  previous atrial fibrillation. He did have rapid pulse by his report and he  came to the emergency room where he had atrial fibrillation. He is not  having any chest pain, neck discomfort, or arm discomfort. He has not been  having any shortness of breath, PND, or orthopnea.   PAST MEDICAL HISTORY:  1.  Persistent atrial fibrillation.  2.  Hypertension.  3.  Mild hyperlipidemia.  4.  Esophageal stricture, status post dilatation.  5.  Sleep apnea.   PAST SURGICAL HISTORY:  Ovopalatopharyngoplasty.   ALLERGIES:  None.   MEDICATIONS:  1.  Diovan/HCTZ 80/12.5 mg.  2.  Potassium 20 mg daily.  3.  Aspirin 325 mg daily.  4.  Coumadin.  5.  Rythmol SR 325 mg q.12h.   SOCIAL HISTORY:  The patient is married. Has five children. He was a  controller for a  business. He does not smoke cigarettes, quitting at the age  of 15.   FAMILY HISTORY:  Contributory for his mother having a MI in her 47s but she  lived to her 82s.   REVIEW OF SYMPTOMS:  As stated in the HPI and was negative for all other  systems.   PHYSICAL EXAMINATION:  GENERAL:  The patient is in no distress.  VITAL SIGNS:  Blood pressure 130/91, heart rate 79 irregular, and afebrile.  HEENT:  Eyes unremarkable. Pupils are equal, round, and reactive to light.  Fundi within normal limits. Oral mucosa unremarkable.  NECK:  No jugular venous distention. Wave form within normal limits. Carotid  upstroke brisk and symmetric. No bruits. No thyromegaly.  LYMPHATICS:  No cervical, axillary, or inguinal adenopathy.  LUNGS:  Clear to auscultation bilaterally.  BACK:  No costovertebral angle tenderness.  CHEST:  Unremarkable.  HEART:  PMI not displaced or sustained. S1 and S2 within normal limits. No  S3, S4, murmurs.  ABDOMEN:  Flat, positive bowel sounds. Normal in frequency and  pitch. No  bruits, rebound, guarding, or midline pulsatile mass. No hepatosplenomegaly.  SKIN:  No rashes, no nodules.  EXTREMITIES:  There are 2+ pulses. No clubbing, cyanosis, or edema.  NEUROLOGICAL:  Oriented to person, place, and time. Cranial nerves II  through XII grossly intact. Motor grossly intact.   LABORATORY DATA:  EKG shows atrial fibrillation, axis within normal limits,  intervals within normal limits, no acute ST or T-wave changes.   ASSESSMENT/PLAN:  1.  Atrial fibrillation:  The patient is having recurrent symptomatic      persistent atrial fibrillation. He will be admitted on telemetry. For      now, we will increase his Rythmol to the maximum dose of 425 mg q.12h.      We will make sure his Coumadin level is therapeutic. I will then defer      to Dr. Jesse Sans. Wall whether he chooses to cardiovert in this higher      dose or switch to another drug such as sotalol with Tikosyn. We will       make sure to check his magnesium and other electrolytes. We will check a      TSH.  2.  Hypertension:  We will continue the Diovan.      JH/MEDQ  D:  03/02/2005  T:  03/03/2005  Job:  [id number]   cc:   Laurita Quint, M.D.  [address] [place]  [place] [address]  Fax: [phone]

## 2011-02-27 NOTE — Op Note (Signed)
NAMEHERNANDEZ, LOSASSO NO.:  [id number]   MEDICAL RECORD NO.:  [id number]          PATIENT TYPE:  INP   LOCATION:  3711                         FACILITY:  [facility]   PHYSICIAN:  Willa Rough, M.D.     DATE OF BIRTH:  1934-06-13   DATE OF PROCEDURE:  11/24/2005  DATE OF DISCHARGE:                                 OPERATIVE REPORT   The patient was has been loaded with Tikosyn and the patient is now  cardioverted.   Anesthesia is present. The patient received 200 mg of IV Pentothal. Anterior-  posterior pads were placed with a biphasic defibrillator. The patient  received 100 joules and was converted to sinus bradycardia. He tolerated the  procedure well.           ______________________________  Willa Rough, M.D.     JK/MEDQ  D:  11/24/2005  T:  11/24/2005  Job:  [id number]   cc:   Duke Salvia, M.D.  [address]  [address]  [place]  [place] [address]

## 2011-02-27 NOTE — Discharge Summary (Signed)
[name], [name] NO.:  [id number]   MEDICAL RECORD NO.:  [id number]          PATIENT TYPE:  INP   LOCATION:  3711                         FACILITY:  [facility]   PHYSICIAN:  Duke Salvia, M.D.  DATE OF BIRTH:  1934/04/13   DATE OF ADMISSION:  11/22/2005  DATE OF DISCHARGE:  11/25/2005                                 DISCHARGE SUMMARY   This patient has no known drug allergies.   PRINCIPAL DIAGNOSES:  1.  Atrial flutter, having previously failed Rythmol.  2.  Tikosyn with successful cardioversion with reversion to atrial      fibrillation after one month, with repeat cardioversion.  3.  Recent lapse into atrial fibrillation with spontaneous conversion.  4.  Patient admitted for up-titration of Tikosyn from 50 mcg b.i.d. to 500      mcg b.i.d.  5.  QTC not prolonged with new Tikosyn dose.   SECONDARY DIAGNOSES:  1.  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  2.  Hypertension.  3.  Gastroesophageal reflux disease.  4.  History of sleep apnea, status post uvulopalatopharyngoplasty.  5.  Dyslipidemia.   No procedures.  The patient was started on Tikosyn 500 mcg b.i.d. and 12-  lead electrocardiograms were serially monitored as well as minute-to-minute  telemetry.   BRIEF HISTORY:  Guy Moore is a 77 year old male whose atrial fibrillation  was first identified in September 2005.  He failed Rythmol treatment.  In  October 2006 he was started on Tikosyn and had successful cardioversion.  One month later he had reversion to atrial fibrillation requiring repeat  DCCV.  He recently had a third reversion but spontaneously converted.  He  had some symptoms with his atrial fibrillation.  They are exercise  intolerance, dyspnea on exertion and palpitations.  In addition, he has some  lightheadedness.  In the past, atrial fibrillation has posed a problem of  tachybrady.  The patient will be brought to [facility] and his  Tikosyn  250 mcg will be up-titrated to 500 mcg b.i.d.   HOSPITAL COURSE:  The patient presented February 12 electively to begin  Tikosyn therapy as an upgrade from 250 mcg to 500 mcg b.i.d.  He was in  atrial fibrillation with controlled ventricular rates and he underwent  cardioversion on February 13 with successful conversion to normal sinus  rhythm.  The patient has maintained sinus rhythm in the post cardioversion  period for a period of 36 hours at the time of discharge.   The patient is discharging on the following medications.  1.  Tikosyn 500 mcg b.i.d.  2.  Aciphex 20 mg daily.  3.  Diovan 160 mg daily.  4.  Potassium chloride 20 mEq twice daily.  5.  Pravachol 40 mg daily at bedtime.  6.  Coumadin 7.5 mg Monday, Wednesday, Friday, 5 mg Tuesday, Thursday,      Saturday and Sunday.  7.  Magnesium oxide one tablet daily.  8.  The patient takes extra potassium when he has diarrhea or nausea.   The patient has had provision for mail order Tikosyn  500 mcg b.i.d. with  outside mail order pharmacy.  He will call the office to ascertain when his  next appointment with the Coumadin clinic should be, and the office will  call him at his office to arrange an appointment, office visit with Dr.  Graciela Husbands in the middle of March, anywhere from a window of March 12 through  March 23, at a time convenient for both Mr. Tamburo and Dr. Graciela Husbands.      Maple Mirza, P.A.    ______________________________  Duke Salvia, M.D.    GM/MEDQ  D:  11/25/2005  T:  11/26/2005  Job:  [id number]   cc:   Jesse Sans. Wall, M.D.  [address]  [address]  [place]  [place] [address]

## 2011-02-27 NOTE — Cardiovascular Report (Signed)
Guy Moore, Guy Moore NO.:  [id number]   MEDICAL RECORD NO.:  [id number]          PATIENT TYPE:  INP   LOCATION:  2003                         FACILITY:  [facility]   PHYSICIAN:  Jesse Sans. Wall, M.D.   DATE OF BIRTH:  [birth year]   DATE OF PROCEDURE:  07/13/2005  DATE OF DISCHARGE:                              CARDIAC CATHETERIZATION   PROCEDURE:  DC electrode cardioversion.   INDICATIONS FOR PROCEDURE:  Recurrent atrial fibrillation.  He is now on  Tikosyn 250 mcg q.12h.   DESCRIPTION OF PROCEDURE:  After informed consent, Dr. Jairo Ben  administered 200 mg of intravenous Pentothal.   150 joules of biphasic current administered with conversion to sinus rhythm  with PAC's and PVC's.  There were no complications.   Post procedure electrocardiogram shows sinus bradycardia with occasional  premature ventricular complexes.   PLAN:  1.  Discharge home later today.  2.  Tikosyn 250 mcg q.12h.  3.  Continue potassium 20 mEq p.o. b.i.d.  4.  Change Diovan/hydrochlorothiazide to Diovan 160 daily.  5.  Follow up with me in one week to 10 days.  I will make these      arrangements.      Thomas C. Wall, M.D.  Electronically Signed     TCW/MEDQ  D:  07/13/2005  T:  07/13/2005  Job:  [id number]   cc:   Laurita Quint, M.D.  Fax: [phone]

## 2011-02-27 NOTE — Assessment & Plan Note (Signed)
[facility]                            CARDIOLOGY OFFICE NOTE   Guy Moore, Guy Moore                      MRN:          [id number]  DATE:09/16/2006                            DOB:          10-Jul-1934    Guy Moore returns today for further management of the following issues:  1. Atrial fibrillation - status post ablation at [facility].  He      is maintaining sinus rhythm off of Tikosyn.  He is due to have some      monitoring done and if this is negative for any atrial arrhythmias,      he says we can probably stop his Coumadin per [facility].  2. Hypertension.  3. Obesity.  4. Hyperlipidemia.   He is due to see Guy Moore next month for a physical and blood work.  His biggest complaint is that he has a lot of arthritic pains and is  immobile.  His weight is going up as his blood pressure.  He is anxious  to think about getting off of Coumadin so he can take a nonsteroidal.  I  think this is quite reasonable.   His blood pressure today is 148/92.  His pulse 61 and regular.  His  weight is 255, up 6.  HEENT:  Normocephalic.  Atraumatic.  PERRLA, extraocular movement is  intact.  Sclerae clear.  LUNGS:  Are clear.  HEART:  Reveals a regular rate and rhythm.  There is no murmur, rub or  gallop.  ABDOMEN:  Is protuberant with good bowel sounds.  EXTREMITIES:  With no cyanosis, clubbing or edema.  Pulses are intact.  NEURO EXAM:  Is intact.  SKIN:  Shows some mild ecchymoses.   Electrocardiogram shows sinus rhythm with normal intervals.   I am delighted that Guy Moore has had no recurrent atrial fibrillation.  I have recommended him to follow up with me in 6 weeks. At that time we  can make a decision about changing his Coumadin to aspirin 325 a day and  work on his blood pressure and his ambulation.     Thomas C. Daleen Squibb, MD, [facility]  Electronically Signed    TCW/MedQ  DD: 09/16/2006  DT: 09/16/2006  Job #: [id number]   cc:   Arta Silence,  MD  Dr. Berneice Gandy

## 2011-02-27 NOTE — H&P (Signed)
NAMESAMEER, TEEPLE NO.:  [id number]   MEDICAL RECORD NO.:  [id number]          PATIENT TYPE:  INP   LOCATION:  1826                         FACILITY:  [facility]   PHYSICIAN:  D[facility] Salvia, M.D.  DATE OF BIRTH:  22-Oct-1933   DATE OF ADMISSION:  02/12/2006  DATE OF DISCHARGE:                                HISTORY & PHYSICAL   PRIMARY CARE PHYSICIAN:  Dr. Hetty Ely.   PRIMARY CARDIOLOGISTS:  Dr. Juanito Doom and Dr. Sherryl Manges.   CHIEF COMPLAINT:  General malaise/palpitations.   HISTORY OF PRESENT ILLNESS:  Mr. Lasser is a 77 year old male with a history  of atrial fibrillation.  He has been tried on multiple medications and had  multiple cardioversions.  Today he had onset of general malaise and  palpitations this morning.  He says his heart rate was more variable than  usual and he had some dizziness with this as well as diaphoresis.  He says  he felt cold and clammy.  He came to the emergency room and he is in atrial  fibrillation.  Dr. Graciela Husbands evaluated him.  Mr. Labonte states that these are his  usual symptoms when he has atrial fibrillation.  He does not tolerate it  well and has been cardioverted for this in the past.  Dr. Graciela Husbands feels that  cardioversion is the best option and this will be set up later today.   Mr. Shin states that he is absolutely compliant with his Tikosyn.  He  states he has missed a couple of doses of potassium in the morning because  he takes it with breakfast but occasionally skips it.  He is otherwise  compliant with his medications and states that his Coumadin has been  therapeutic for quite a while.  Coumadin clinic records are pending.   PAST MEDICAL HISTORY:  1.  History of paroxysmal atrial fibrillation, Rythmol was not successful,      Tikosyn since October 2006.  2.  Status post direct current cardioversions in January 2006, February      2006, October 2006, November 2006 and February 2007.  3.  Hypertension.  4.   Dyslipidemia.  5.  Obesity.  6.  History of bradycardia secondary to beta blockers.  7.  Gastroesophageal reflux disease, esophageal stricture and      diverticulosis.  8.  Obstructive sleep apnea.  9.  Chronic anticoagulation with Coumadin.  10. Osteoarthritis.   SURGICAL HISTORY:  1.  Status post uvulopalatopharyngoplasty.  2.  EGD with dilatation.   ALLERGIES:  NO KNOWN DRUG ALLERGIES.   MEDICATIONS:  1.  Aciphex 20 mg a day.  2.  Diovan 160 mg a day.  3.  K-Dur 20 mEq b.i.d.  4.  Pravachol 40 mg a day.  5.  Coumadin 7.5 mg on Monday, Wednesday and Friday but 5 mg other days.  6.  Tikosyn 500 mcg q.12 h.  7.  Extra-Strength Tylenol two tablets q.h.s. and p.r.n. arthritis pain.   SOCIAL HISTORY:  He lives in [place] with his wife and is working as a  Chief Operating Officer.  He is a retired  Teacher, music as well.  He has no  significant history of alcohol, tobacco or drug abuse.   FAMILY HISTORY:  His mother died at age 82 of heart failure and cancer.  His  father died at age 24, but no further information is available.  He has one  brother with a history of atrial fibrillation and CVA but no family history  of premature coronary artery disease.   REVIEW OF SYSTEMS:  He had a clammy feeling and sweats with the atrial  fibrillation.  He has a history of hearing loss.  He has some chronic  dyspnea on exertion but this has not changed recently. Palpitations are  today only.  He had some dizziness with this.  He has chronic arthralgias.  His reflux symptoms are well controlled on medication.  Review of systems is  otherwise negative.   PHYSICAL EXAMINATION:  VITAL SIGNS:  Temperature is 98.8, blood pressure  143/83, heart rate 67, respiratory rate 14, O2 saturation 98% on room air.  GENERAL:  He is a well-developed elderly white male in no acute distress.  HEENT:  His head is normocephalic and atraumatic.  Pupils equal, round and  reactive to light and accommodation.  Extraocular  movements intact.  Sclerae  clear.  Nares without discharge.  NECK:  There is no JVD, no thyromegaly, and no carotid bruits are noted.  LUNGS:  Clear to auscultation bilaterally.  CV:  His heart is irregular in rate and rhythm with an S1-S2 and no  significant murmur, rub or gallop is noted.  ABDOMEN:  Soft and nontender with active bowel sounds.  EXTREMITIES:  He has no cyanosis, clubbing or edema noted.  Distal pulses  are 2+ in all extremities with no femoral bruits appreciated.  NEURO:  He is alert and oriented with cranial nerves II-XII grossly intact.   STUDIES:  EKG shows atrial fibrillation with a controlled ventricular  response.   Laboratory values are pending at the time of dictation.  With no history of  significant tobacco use, chest x-ray is deferred.   IMPRESSION:  1.  Paroxysmal atrial fibrillation:  Mr. Asbridge tolerates this poorly.  He      was evaluated by Dr. Graciela Husbands who felt that the best option for him was      repeat direct current cardioversion.  Mr. Melchior is aware of the plan of      care and in agreement with this.  Because he is on Tikosyn he will be      held for 12 hours after the cardioversion.  This will put him here      overnight.  He will be discharged in the morning if he has no problems      that and if his labs are stable.  We will check a basic metabolic panel      as well as a CBC and an INR.  Records are pending from the Coumadin      Clinic but the verbal report is that he has been therapeutic ever since      2005.  We will check his magnesium level as well.  If he has any      abnormalities these will be supplemented prior to the cardioversion.  2.  Mr. Ricke is otherwise stable and will be continued on all of his home      medications.   Again, this is Theodore Demark, P.A.-C. dictating for Dr. Sherryl Manges who  saw the patient and determined the  plan of care.      Theodore Demark, P.A. [facility]   ______________________________  [facility] Salvia, M.D.    RB/MEDQ  D:  02/12/2006  T:  02/12/2006  Job:  [id number]

## 2011-02-27 NOTE — Discharge Summary (Signed)
Guy Moore, Guy Moore NO.:  [id number]   MEDICAL RECORD NO.:  [id number]          PATIENT TYPE:  INP   LOCATION:  2003                         FACILITY:  [facility]   PHYSICIAN:  Jesse Sans. Wall, M.D.   DATE OF BIRTH:  [birth year]   DATE OF ADMISSION:  07/10/2005  DATE OF DISCHARGE:  07/13/2005                                 DISCHARGE SUMMARY   PRINCIPAL DIAGNOSIS:  Atrial fibrillation.   OTHER DIAGNOSES:  1.  Hypertension.  2.  Gastroesophageal reflux disease.  3.  Hypokalemia.  4.  History of hiatal hernia with ulcer in 1976.  5.  Diverticulosis found in 2002 via colonoscopy.  6.  Hyperlipidemia.   ALLERGIES:  No known drug allergies.   PROCEDURES:  Direct-current cardioversion.   HISTORY OF PRESENT ILLNESS:  This is a 77 year old white male with a prior  history of atrial fibrillation diagnosed in November of 2005, who was  initially on Rythmol therapy at 225 mg twice daily in January of 2006 with  subsequent increase to a total of 425 mg twice daily by May of 2006  secondary to recurrent atrial fibrillation.  He had been doing well, but in  September of 2006, had recurrent irregular heart rhythm with dyspnea on  exertion and was seen in the office on July 06, 2005.  Decision was  made at that time after discussion with Dr. Sharrell Ku that the patient  should come off the propafenone and be hospitalized on July 10, 2005  for Tikosyn loading.   HOSPITAL COURSE:  Following admission, Guy Moore was initiated on Tikosyn at  250 mcg q.12 h.  He tolerated this dose well with stable QTc of 455-468  msec; however, he remained in atrial fibrillation following loading of 5  doses.  As a result, on July 13, 2005, he underwent successful D-C  cardioversion with restoration of sinus bradycardia with a QTc at 433 msec.  Following cardioversion, he was discharged home in satisfactory condition.   DISCHARGE LABORATORY DATA:  PT 23.5, INR 2.1.  Hemoglobin  15.4, hematocrit  44.8, WBC 4.0, platelets 166,000.  Sodium 140, potassium 3.7, chloride 105,  CO2 29, BUN 13, creatinine 1.0, glucose 132, calcium 8.4, total bilirubin  0.7, alkaline phosphatase 40, AST 21, ALT 24.  Digoxin less than 0.2.  Magnesium 2.1.   DISPOSITION:  The patient is being discharged home in good condition.   FOLLOWUP PLANS AND APPOINTMENTS:  Guy Moore has an appointment to follow up  with Dr. Daleen Squibb on July 27, 2005 at 1:45 p.m.   DISCHARGE MEDICATIONS:  1.  Tikosyn 250 mcg twice daily.  2.  Potassium chloride 20 mEq twice daily.  3.  Diovan 160 mg daily.  4.  Coumadin as previously prescribed.  5.  Aciphex 20 mg daily.   OUTSTANDING LABORATORY STUDIES:  None.   DURATION OF DISCHARGE ENCOUNTER:  Forty minutes including physician time.      Ok Anis, NP      Jesse Sans. Wall, M.D.  Electronically Signed    CRB/MEDQ  D:  12/28/2005  T:  12/29/2005  Job:  [id number]

## 2011-02-27 NOTE — Assessment & Plan Note (Signed)
[facility]                            CARDIOLOGY OFFICE NOTE   Guy Moore, Guy Moore                      MRN:          [id number]  DATE:10/29/2006                            DOB:          18-Jun-1934    Guy Moore returns today for further management of his atrial  fibrillation.  He just visited [facility] yesterday and had a CardioNet that  showed no atrial fib over several weeks.  He had been off Tikosyn since  September.   His biggest problem is he now become glucose intolerant, and his blood  pressure is poorly controlled.  He has seen Dr. Hetty Ely on a regular  basis.  Dr. Hetty Ely has read him the riot act about his diet.   CURRENT MEDICATIONS:  1. AcipHex 20 mg a day.  2. Warfarin as directed.  3. Pravachol 40 mg a day.  4. Diovan 320 mg a day.  5. Magnesium q. day.  6. Potassium 20 mEq b.i.d.   PHYSICAL EXAMINATION:  VITAL SIGNS:  His blood pressure is high today at  162/84.  With a large cuff, his pulse is 59.  He is in sinus rhythm with  a normal PR, QRS and QTC.  His weight is up 258.  He had been down as  low as 249 in August.  HEENT:  Normocephalic, atraumatic.  PERRLA, extraocular movements  intact, sclerae clear.  There is no JVD.  Carotid upstrokes are equally  bilaterally without bruits.  There is no thyromegaly.  LUNGS:  Clear.  HEART:  Reveals soft S1, S2 without gallop, nausea.  ABDOMEN:  Protuberant, good bowel sounds.  EXTREMITIES:  Reveals no cyanosis, clubbing or edema.  Pulses are  intact.  NEURO:  Exam is intact.   ASSESSMENT/PLAN:  Atrial fibrillation now, cured from ablation.  He  has been off Tikosyn since September.  A recent CardioNet showed no  recurrence of his atrial fib.  He is in sinus rhythm today.   Per our previous discussions, we can stop his Coumadin.  I placed him on  aspirin 325 mg a day.  We have also stopped his magnesium, since he is  no longer on Tikosyn.  He will follow up with  Dr. Hetty Ely  concerning his weight, his glucose intolerance and his  blood pressure.  I will see him back again in 3 months.     Thomas C. Daleen Squibb, MD, [facility]  Electronically Signed    TCW/MedQ  DD: 10/29/2006  DT: 10/29/2006  Job #: [id number]   cc:   Arta Silence, MD

## 2011-02-27 NOTE — H&P (Signed)
NAMECESAREO, Guy Moore NO.:  [id number]   MEDICAL RECORD NO.:  [id number]          PATIENT TYPE:  INP   LOCATION:  3711                         FACILITY:  [facility]   PHYSICIAN:  [facility] Salvia, M.D.  DATE OF BIRTH:  Feb 22, 1934   DATE OF ADMISSION:  11/22/2005  DATE OF DISCHARGE:  11/25/2005                                HISTORY & PHYSICAL   CARDIOLOGIST:  Dr. Valera Castle   ELECTROPHYSIOLOGIST:  Dr. Sherryl Manges   Patient has no known drug allergies.   PRESENTING CIRCUMSTANCE:  I am here to get even more Tikosyn than I was  taking.   HISTORY OF PRESENT ILLNESS:  Mr. Christen is a 77 year old male.  He has  symptomatic atrial fibrillation.  The symptoms include exercise intolerance,  dyspnea on exertion, and palpitations.  With atrial fibrillation he has  tachy-brady.  The following measures have been taken in the past.   A.)  September 2006:  Rythmol therapy, ineffective.  B.)  October 2006:  Initiation of Tikosyn with successful DC cardioversion  to sinus rhythm.  C.)  November 2006:  Recurrence of atrial fibrillation with spontaneous  reconversion to sinus rhythm.  D.)  February 2007:  Hospital admission for upgrading of his Tikosyn 250 mcg  to 500 mcg b.i.d.   The patient also has a history of hypertension, gastroesophageal reflux  disease, sleep apnea.  He is status post uvulopalatopharyngoplasty and he  has history of dyslipidemia.   MEDICATIONS:  1.  Tikosyn 250 mcg b.i.d.  2.  Aciphex 20 mg daily.  3.  Diovan 160 mg daily.  4.  Potassium chloride 20 mEq twice daily.  5.  Pravachol 40 mg daily bedtime.  6.  Coumadin 7.5 mg Monday, Wednesday, Friday; 5 mg Tuesday, Thursday,      Saturday, Sunday.  7.  Magnesium oxide one tablet daily.   SOCIAL HISTORY:  Patient is married, father of five.  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  He does not smoke.  He takes only  occasional alcoholic beverages and has no history of drug abuse.   FAMILY HISTORY:  Noncontributory.   REVIEW OF SYSTEMS:  The patient does not have any fevers, chills, night  sweats.  No uncontrollable weight gain or loss.  No adenopathy.  HEENT:  No  nasal discharge.  No epistaxis.  No vertigo.  No photophobia.  INTEGUMENT:  No rashes, non-healing ulcerations.  CARDIOPULMONARY:  No chest pain.  No  shortness of breath except with relapsing atrial fibrillation.  No  orthopnea.  No paroxysmal nocturnal dyspnea.  No edema.  No history of  presyncope or syncope.  Palpitations are present with atrial fibrillation  and alternate with very slow heart rates.  __________ nocturia x2.  No  history of hesitation or hematuria.  GASTROINTESTINAL:  He has history of  heartburn for which he takes Aciphex.  He has a history of esophageal  stricture and is status post dilatation.  MUSCULOSKELETAL:  No specific  complaints.  NEUROLOGIC:  No focal deficits.   PHYSICAL EXAMINATION:  GENERAL:  This is  an alert, oriented male.  He  appears his stated age.  VITAL SIGNS:  Blood pressure 129/77, heart rate is 62.  His weight is 250.  HEENT:  Eyes:  Pupils are equal, round, and reactive to light.  Extraocular  movements are intact.  Sclerae are non-icteric.  NECK:  Supple.  No carotid bruits auscultated.  No jugular venous  distention.  CHEST:  Clear to auscultation and percussion bilaterally.  HEART:  Regular rate and rhythm without murmur or gallop.  ABDOMEN:  Soft.  Active bowel sounds.  No hepatosplenomegaly.  Femoral  pulses are present at the dorsalis pedis.  Radial pulses are 4/4  bilaterally.  EXTREMITIES:  No evidence of clubbing, cyanosis, edema.  NEUROLOGIC:  Grossly normal.   IMPRESSION:  1.  Paroxysmal atrial fibrillation.  2.  Failed Rythmol therapy.  3.  Tikosyn at 250 mcg b.i.d. has caused his atrial fibrillation to become      paroxysmal.  We will try to increase the dose of Tikosyn from 250 mcg to      500 mcg in hopes that it will completely cause  conversion to sinus      rhythm.      Maple Mirza, P.A.    ______________________________  [facility] Salvia, M.D.    GM/MEDQ  D:  01/05/2006  T:  01/06/2006  Job:  [id number]

## 2011-02-27 NOTE — Op Note (Signed)
NAMETRAVEN, DAVIDS NO.:  [id number]   MEDICAL RECORD NO.:  [id number]          PATIENT TYPE:  OIB   LOCATION:  2899                         FACILITY:  [facility]   PHYSICIAN:  Jesse Sans. Wall, M.D.   DATE OF BIRTH:  1934/02/15   DATE OF PROCEDURE:  10/28/2004  DATE OF DISCHARGE:                                 OPERATIVE REPORT   DC cardioversion.   INDICATIONS:  Atrial fibrillation.   CONSENT:  Informed consent obtained.  Dr. Jairo Ben anesthesia.  Administered 500 of Pentothal and subsequently a total of 70 of Propofol.  This was because of extended need for sedation secondary to pad dysfunction.   Biphasic DC cardioversion was attempted with 75/150/200/200 joules without  success.  He remained in atrial fibrillation.   There were no complications.   I have had a discussion with his wife and the patient.  We have arranged for  a GXT Cardiolite on Wednesday, November 05, 2004 at 9:45 a.m. at my office.  This will be off atenolol.  If this is negative for ischemia, will  subsequently discuss specific anti-arrhythmic therapy.  I will address this  also with Dr. Rosezella [place].      TCW/MEDQ  D:  10/28/2004  T:  10/28/2004  Job:  [id number]   cc:   Laurita Quint, M.D.  [address] [place]  [place] [address]  Fax: [phone]

## 2011-03-10 ENCOUNTER — Ambulatory Visit (INDEPENDENT_AMBULATORY_CARE_PROVIDER_SITE_OTHER): Payer: Medicare Other | Admitting: Internal Medicine

## 2011-03-10 VITALS — BP 142/70 | HR 57 | Temp 97.0°F | Wt 261.0 lb

## 2011-03-10 DIAGNOSIS — Z0000 Encounter for general adult medical examination without abnormal findings: Secondary | ICD-10-CM

## 2011-03-10 NOTE — Progress Notes (Signed)
Subjective:    Patient ID: Guy Moore, male    DOB: 05-21-1934, 77 y.o.   MRN: [id number]  HPI  The patient is here for annual Medicare wellness examination and management of other chronic and acute problems. In the interval since his visit in August he has had two episodes of recurrent A. Fib with rapid RVR with two cardioversions at [facility]. He is on [place]. He will be talking with Dr. Kathleen Argue may 30th about possible ablation. He also had a problem with left shoulder with bone spur and possible tear but he went with conservative physical therapy with good results.    The risk factors are reflected in the social history.  The roster of all physicians providing medical care to patient - is listed in the Snapshot section of the chart.  Activities of daily living:  The patient is 100% inedpendent in all ADLs: dressing, toileting, feeding as well as independent mobility. Handles all his own financial affairs. No falls and no fall risk.  Home safety : The patient has smoke detectors in the home. They wear seatbelts. No firearms at home. There is no violence in the home.   There is no risks for hepatitis, STDs or HIV. There is no   history of blood transfusion. They have no travel history to infectious disease endemic areas of the world.  The patient has seen their dentist in the last six month. He did crack a tooth and had to have a new cap.  They have seen their eye doctor in the last year. Freckl on the left eye is benign and will be followed conservatively.  They admit to hearing difficulty ,does wear hearing aids, and have not had audiologic testing in the last year.  They do not  have excessive sun exposure. Discussed the need for sun protection: hats, long sleeves and use of sunscreen if there is significant sun exposure.   Diet: the importance of a healthy diet is discussed. They do have a healthy diet.  Exercise - has a regular program: regular walking - 45 min daily.  Depression -  no vegetive signs of depression: anhedonia, sadness, change in appetite.  Memory/Cognition - day/date/year - ok. Recall  3 /3. Normal clock face exercise.  Past Medical History  Diagnosis Date  . Atrial fibrillation   . Atrial flutter   . HTN (hypertension)   . Other and unspecified hyperlipidemia   . Shortness of breath   . Poisoning by electrolytic, caloric, and water-balance agents   . Type II or unspecified type diabetes mellitus without mention of complication, not stated as uncontrolled   . Hyperglycemia   . BCC (basal cell carcinoma of skin)     Nose  . Knee problem     2% permanent partial impairment Right  . Gastric ulcer   . Hiatal hernia   . Allergic rhinitis   . Hearing loss     uses amplification   Past Surgical History  Procedure Date  . Uvulopalatopharyngoplasty 1980's  . Vasectomy 1970's    w/complications   Family History  Problem Relation Age of Onset  . Stroke Mother   . Breast cancer Mother   . Heart disease Father   . Hypertension Father   . Cancer Father     Bladder  . Breast cancer Brother   . Atrial fibrillation Brother   . Tremor Brother   . Stroke Brother    History   Social History  . Marital Status: Married  Spouse Name: N/A    Number of Children: N/A  . Years of Education: N/A   Occupational History  . Retired at 32[employer]   . 27 years [employer]   . Med Engineer, manufacturing   . 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 Smoking status: Not on file  . Smokeless tobacco: Not on file  . Alcohol Use: Yes     Ocasional  . Drug Use: No  . Sexually Active: Not on file   Other Topics Concern  . Not on file   Social History Narrative   END OF LIFE CARE: he does not want CPR, short term mechanical ventilation. He does not want heroic or futile care especially if he cannon communicate, read and enjoy his loved ones.            Review of Systems Review of Systems  Constitutional:  Negative  for fever, chills, activity change and unexpected weight change.  HENT:  Negative for hearing loss, ear pain, congestion, neck stiffness and postnasal drip.   Eyes: Negative for pain, discharge and visual disturbance.  Respiratory: Negative for chest tightness and wheezing.   Cardiovascular: Negative for chest pain.       No decreased exercise tolerance Gastrointestinal: No change in bowel habit. No bloating or gas. No reflux or indigestion Genitourinary: Negative for urgency, frequency, flank pain and difficulty urinating.  Musculoskeletal: Negative for myalgias, back pain, arthralgias and gait problem.  Neurological: Negative for dizziness, tremors, weakness and headaches.  Hematological: Negative for adenopathy.  Psychiatric/Behavioral: Negative for behavioral problems and dysphoric mood.       Objective:   Physical Exam Vitals noted WNWD WM in no distress HEENT - aids in both ears, oropharynx without lesions, no buccal or palatal lesions Neck - supple, no thyromegaly Node - negative throughout Chest - no deformity Lungs - CTAP, no rales or wheezes Cor - IRIR but rate controlled, no murmurs Abd- protruberant, BS + x 4, no HSM, no guarding or rebound genitalia - deferred Extremities - no deformity, no erythema, synovial thickening or swelling about small, medium or large joints. Derm - clear Neuro - A&O x 3, CN II-XII normal, MS 5/5 and equal, DTRs 2+ brachial, radial and patella, cerebellar - no tremor, nl gait.  Reviewed outside lab thatis normal including normal A1C 5.9%         Assessment & Plan:  1. hyperlipidemia - controlled on present medications  2. Hypertension - subopitmal control but no change in medications at this time.  3. Diabetes- controlled with life-style mgt only  4. Atrial fibrillation - long standing problem s/p ablation in the past, DC  Cardioversion in the past. He is now having episodes of rapid ventricular response. He will be seeing Dr.  Kathleen Argue May 30th.  5. Health maintenance - interval history significant for  Heart issues as above. Limited physical exam is normal. Lab results from outside lab OK. He is current with colonoscopy with last study in '02 so due this year. Immunizations - tetanus  '06; shingles vaccine '10; pneumonia vaccine '02.   In summary - a very nice man who appears to be medically stable except for on-going issues with a. Fib. He will return as needed or in 1 year.

## 2011-04-20 ENCOUNTER — Ambulatory Visit (INDEPENDENT_AMBULATORY_CARE_PROVIDER_SITE_OTHER): Payer: Medicare Other | Admitting: Internal Medicine

## 2011-04-20 VITALS — BP 126/88 | HR 59 | Temp 97.7°F | Wt 264.0 lb

## 2011-04-20 DIAGNOSIS — E785 Hyperlipidemia, unspecified: Secondary | ICD-10-CM

## 2011-04-20 DIAGNOSIS — I4891 Unspecified atrial fibrillation: Secondary | ICD-10-CM

## 2011-04-20 DIAGNOSIS — E119 Type 2 diabetes mellitus without complications: Secondary | ICD-10-CM

## 2011-04-20 DIAGNOSIS — I10 Essential (primary) hypertension: Secondary | ICD-10-CM

## 2011-04-20 DIAGNOSIS — Z1211 Encounter for screening for malignant neoplasm of colon: Secondary | ICD-10-CM

## 2011-04-20 NOTE — Progress Notes (Signed)
Subjective:    Patient ID: Guy Moore, male    DOB: Nov 10, 1933, 77 y.o.   MRN: [id number]  HPI Guy Moore presents for follow-up. He had a cardiac ablation at [facility] in June for atrial tachycardia. He does have a large bruise right medial thigh after ablation procedure. In the past he has had anemia post procedure but at this time he is not having any symptoms.  He has been on anti-lipemic medication due to known plaque lesion.   He has some concerns about blood sugar: reviewed echart to '05 most serum glucose readings are in normal range. He had A1C done May '11 and Nov '10 both were 6.3%. He is advised to follow a low/no sugar diet and to exercise on a regular basis.   Past Medical History  Diagnosis Date  . Atrial fibrillation   . Atrial flutter   . HTN (hypertension)   . Other and unspecified hyperlipidemia   . Shortness of breath   . Poisoning by electrolytic, caloric, and water-balance agents   . Type II or unspecified type diabetes mellitus without mention of complication, not stated as uncontrolled   . Hyperglycemia   . BCC (basal cell carcinoma of skin)     Nose  . Knee problem     2% permanent partial impairment Right  . Gastric ulcer   . Hiatal hernia   . Allergic rhinitis   . Hearing loss     uses amplification   Past Surgical History  Procedure Date  . Uvulopalatopharyngoplasty 1980's  . Vasectomy 1970's    w/complications   Family History  Problem Relation Age of Onset  . Stroke Mother   . Breast cancer Mother   . Heart disease Father   . Hypertension Father   . Cancer Father     Bladder  . Breast cancer Brother   . Atrial fibrillation Brother   . Tremor Brother   . Stroke Brother    History   Social History  . Marital Status: Married    Spouse Name: N/A    Number of Children: N/A  . Years of Education: N/A   Occupational History  . Retired at 67[employer]   . 27 years [employer]   . Med Engineer, manufacturing   . 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 Smoking status: Not on file  . Smokeless tobacco: Not on file  . Alcohol Use: Yes     Ocasional  . Drug Use: No  . Sexually Active: Not on file   Other Topics Concern  . Not on file   Social History Narrative   END OF LIFE CARE: he does not want CPR, short term mechanical ventilation. He does not want heroic or futile care especially if he cannon communicate, read and enjoy his loved ones.         Review of Systems Review of Systems  Constitutional:  Negative for fever, chills, activity change and unexpected weight change.  HEENT:  Negative for hearing loss, ear pain, congestion, neck stiffness and postnasal drip. Negative for sore throat or swallowing problems. Negative for dental complaints.   Eyes: Negative for vision loss or change in visual acuity.  Respiratory: Negative for chest tightness and wheezing.   Cardiovascular: Negative for chest pain and palpitation. No decreased exercise tolerance Gastrointestinal: No change in bowel habit. No bloating or gas. No reflux or indigestion Genitourinary: Negative for urgency, frequency, flank pain and difficulty urinating.  Musculoskeletal:  Negative for myalgias, back pain, arthralgias and gait problem.  Neurological: Negative for dizziness, tremors, weakness and headaches.  Hematological: Negative for adenopathy.  Psychiatric/Behavioral: Negative for behavioral problems and dysphoric mood.       Objective:   Physical Exam Vitals reviewed - stable but overweight Gen'l - overweight white male in no distress HEETN C&S clear Pulmonary - normal respirations Cor - Regular rate Derm - very large ecchymosis medial aspect of right proximal thigh       Assessment & Plan:

## 2011-04-21 NOTE — Assessment & Plan Note (Signed)
Reviewed labs with the patient. Strongly advised life-style management: very low or no sugar diet, reduced carbohydrates and regular exercise. No indication for medical management at this time although he is at risk for medical management in the future.

## 2011-04-21 NOTE — Assessment & Plan Note (Signed)
Lab Results  Component Value Date   CHOL 134 02/26/2010   CHOL 135 02/28/2009   CHOL 131 10/21/2007   Lab Results  Component Value Date   HDL 47.80 02/26/2010   HDL 36.64 02/28/2009   HDL 40.3 10/21/2007   Lab Results  Component Value Date   LDLCALC 80 02/26/2010   LDLCALC 74 02/28/2009   LDLCALC 78 10/21/2007   Lab Results  Component Value Date   TRIG 29.0 02/26/2010   TRIG 41.0 02/28/2009   TRIG 52 10/21/2007   Lab Results  Component Value Date   CHOLHDL 3 02/26/2010   CHOLHDL 3 02/28/2009   CHOLHDL 3.1 CALC 10/21/2007   Lab results from [facility] closely mirror values determined here.  Plan - excellent control with Pravastatin 40mg  daily which he tolerates well

## 2011-04-21 NOTE — Assessment & Plan Note (Signed)
History of atrial fib well controlled on present medications including Tikosyn. He also had symptomatic atrial tachycardia which was successfully ablated at [facility] in June.  Plan - keep scheduled follow-up at [facility]

## 2011-04-21 NOTE — Assessment & Plan Note (Signed)
BP Readings from Last 3 Encounters:  04/20/11 126/88  03/10/11 142/70  05/30/10 124/82   Good control. Continue present medications.

## 2011-05-04 ENCOUNTER — Other Ambulatory Visit: Payer: Self-pay | Admitting: Internal Medicine

## 2011-05-15 ENCOUNTER — Telehealth: Payer: Self-pay | Admitting: Pulmonary Disease

## 2011-05-15 ENCOUNTER — Encounter: Payer: Self-pay | Admitting: Pulmonary Disease

## 2011-05-15 ENCOUNTER — Ambulatory Visit (INDEPENDENT_AMBULATORY_CARE_PROVIDER_SITE_OTHER): Payer: Medicare Other | Admitting: Pulmonary Disease

## 2011-05-15 VITALS — BP 140/70 | HR 55 | Temp 98.0°F | Ht 69.0 in | Wt 262.2 lb

## 2011-05-15 DIAGNOSIS — G4733 Obstructive sleep apnea (adult) (pediatric): Secondary | ICD-10-CM

## 2011-05-15 NOTE — Telephone Encounter (Signed)
Pt states he wants this dr in his careteam along with the phone number and for [facility] to be aware this is who he sees.  This info was placed in pt's careteam and will send message to [facility] so he is aware.

## 2011-05-15 NOTE — Assessment & Plan Note (Signed)
The pt is doing well with his cpap, and has even lost weight.  I have asked him to continue.  He is to keep up with mask changes and supplies, and to followup with me in one year.

## 2011-05-15 NOTE — Patient Instructions (Signed)
Continue with cpap, and work on weight loss Would replace your plastic humidifier. followup with me in one year, or sooner if having issues.

## 2011-05-15 NOTE — Progress Notes (Signed)
  Subjective:    Patient ID: Guy Moore, male    DOB: [birth year], 77 y.o.   MRN: [id number]  HPI The pt comes in today for f/u of his known osa.  He is wearing cpap compliantly, and reports no issues with his pressure or mask fit.  His weight is down from last visit, and I have urged him to continue.  He feels he is sleeping well, and denies any issues with his alertness.  He does have mold in his humidifier.     Review of Systems  Constitutional: Negative for fever and unexpected weight change.  HENT: Negative for ear pain, nosebleeds, congestion, sore throat, rhinorrhea, sneezing, trouble swallowing, dental problem, postnasal drip and sinus pressure.   Eyes: Negative for redness and itching.  Respiratory: Negative for cough, chest tightness, shortness of breath and wheezing.   Cardiovascular: Positive for palpitations. Negative for leg swelling.  Gastrointestinal: Negative for nausea and vomiting.  Genitourinary: Negative for dysuria.  Musculoskeletal: Negative for joint swelling.  Skin: Negative for rash.  Neurological: Negative for headaches.  Hematological: Does not bruise/bleed easily.  Psychiatric/Behavioral: Negative for dysphoric mood. The patient is not nervous/anxious.        Objective:   Physical Exam Ow male in nad No skin breakdown or pressure necrosis from cpap mask LE without edema, no cyanosis noted. Alert, does not appear sleepy, moves all 4        Assessment & Plan:

## 2011-05-21 ENCOUNTER — Encounter: Payer: Self-pay | Admitting: Pulmonary Disease

## 2011-06-16 ENCOUNTER — Other Ambulatory Visit: Payer: Self-pay | Admitting: Cardiology

## 2011-07-03 ENCOUNTER — Encounter: Payer: Self-pay | Admitting: Cardiology

## 2011-07-03 ENCOUNTER — Ambulatory Visit (INDEPENDENT_AMBULATORY_CARE_PROVIDER_SITE_OTHER): Payer: Medicare Other | Admitting: Cardiology

## 2011-07-03 VITALS — BP 158/76 | HR 58 | Ht 68.0 in | Wt 258.0 lb

## 2011-07-03 DIAGNOSIS — I10 Essential (primary) hypertension: Secondary | ICD-10-CM

## 2011-07-03 DIAGNOSIS — E785 Hyperlipidemia, unspecified: Secondary | ICD-10-CM

## 2011-07-03 DIAGNOSIS — G4733 Obstructive sleep apnea (adult) (pediatric): Secondary | ICD-10-CM

## 2011-07-03 DIAGNOSIS — I4891 Unspecified atrial fibrillation: Secondary | ICD-10-CM

## 2011-07-03 DIAGNOSIS — E119 Type 2 diabetes mellitus without complications: Secondary | ICD-10-CM

## 2011-07-03 NOTE — Assessment & Plan Note (Signed)
Stable. Will followup at [facility]  October 31. At that time they'll probably stop his Tikosyn. I will see him back p.r.n to avoid duplicate care. He understands.

## 2011-07-03 NOTE — Patient Instructions (Signed)
Your physician recommends that you schedule a follow-up appointment in: as needed with Dr. Wall  

## 2011-07-03 NOTE — Progress Notes (Signed)
HPI Guy Moore returns today for followup of his paroxysmal atrial fib. Had recurrence at this time as atrial tachycardia. Head is ablated in 2 and has done well since then. He remains on Tikosyn. He thinks this OB stopped in October on followup. He  He is compliant with his medications. Blood work and other medical issues being followed by primary care.  EKG today shows sinus bradycardia with a QTC of 447 ms Past Medical History  Diagnosis Date  . Atrial fibrillation   . Atrial flutter   . HTN (hypertension)   . Other and unspecified hyperlipidemia   . Shortness of breath   . Poisoning by electrolytic, caloric, and water-balance agents   . Type II or unspecified type diabetes mellitus without mention of complication, not stated as uncontrolled   . Hyperglycemia   . BCC (basal cell carcinoma of skin)     Nose  . Knee problem     2% permanent partial impairment Right  . Gastric ulcer   . Hiatal hernia   . Allergic rhinitis   . Hearing loss     uses amplification    Past Surgical History  Procedure Date  . Uvulopalatopharyngoplasty 1980's  . Vasectomy 1970's    w/complications  . Cardiac electrophysiology study and ablation     x 4     Family History  Problem Relation Age of Onset  . Stroke Mother   . Breast cancer Mother   . Heart disease Father   . Hypertension Father   . Cancer Father     Bladder  . Breast cancer Brother   . Atrial fibrillation Brother   . Tremor Brother   . Stroke Brother     History   Social History  . Marital Status: Married    Spouse Name: N/A    Number of Children: N/A  . Years of Education: N/A   Occupational History  . Retired at 56[employer]   . 27 years [employer]   . Med Engineer, manufacturing   . 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 Smoking status: Former Smoker -- 1.0 packs/day for 28 years    Types: Cigarettes    Quit date: 10/12/1977  . Smokeless tobacco: Never Used  . Alcohol Use: Yes   Occasional  . Drug Use: No  . Sexually Active: Not on file   Other Topics Concern  . Not on file   Social History Narrative   END OF LIFE CARE: he does not want CPR, short term mechanical ventilation. He does not want heroic or futile care especially if he cannon communicate, read and enjoy his loved ones.      No Known Allergies  Current Outpatient Prescriptions  Medication Sig Dispense Refill  . dabigatran (PRADAXA) 150 MG CAPS Take 150 mg by mouth every 12 (twelve) hours.        Marland Kitchen DIOVAN 320 MG tablet TAKE 1 TABLET BY MOUTH AT BEDTIME  90 tablet  1  . dofetilide (TIKOSYN) 500 MCG capsule Take 500 mcg by mouth 2 (two) times daily.        . hydrocortisone (ANUSOL-HC) 25 MG suppository Place 25 mg rectally 2 (two) times daily as needed.        Marland Kitchen MAGNESIUM GLUCONATE PO Take by mouth daily.        Marland Kitchen NEXIUM 40 MG capsule TAKE 1 CAPSULE BY MOUTH EVERY MORNING  90 capsule  2  . potassium chloride SA (K-DUR,KLOR-CON) 20  MEQ tablet TAKE 1 TABLET BY MOUTH ONCE A DAY  90 tablet  2  . pravastatin (PRAVACHOL) 40 MG tablet TAKE 1 TABLET BY MOUTH AT BEDTIME  90 tablet  2    ROS Negative other than HPI.   PE General Appearance: well developed, well nourished in no acute distress , obese HEENT: symmetrical face, PERRLA, good dentition  Neck: no JVD, thyromegaly, or adenopathy, trachea midline Chest: symmetric without deformity Cardiac: PMI non-displaced, RRR, normal S1, S2, no gallop or murmur Lung: clear to ausculation and percussion Vascular: all pulses full without bruits  Abdominal: nondistended, nontender, good bowel sounds, no HSM, no bruits Extremities: no cyanosis, clubbing or edema, no sign of DVT, no varicosities  Skin: normal color, no rashes Neuro: alert and oriented x 3, non-focal Pysch: normal affect Filed Vitals:   07/03/11 1106  BP: 158/76  Pulse: 58  Height: 5\' 8"  (1.727 m)  Weight: 258 lb (117.028 kg)    EKG  Labs and Studies Reviewed.   Lab Results  Component  Value Date   WBC 3.5* 04/07/2010   HGB 13.4 04/07/2010   HCT 38.7* 04/07/2010   MCV 88.4 04/07/2010   PLT 134.0* 04/07/2010      Chemistry      Component Value Date/Time   NA 143 03/25/2010 0000   K 3.9 03/25/2010 0000   CL 104 03/25/2010 0000   CO2 31 03/25/2010 0000   BUN 16 03/25/2010 0000   CREATININE 1.0 03/25/2010 0000      Component Value Date/Time   CALCIUM 8.8 03/25/2010 0000   ALKPHOS 41 02/26/2010 0852   AST 25 02/26/2010 0852   ALT 28 02/26/2010 0852   BILITOT 0.5 02/26/2010 0852       Lab Results  Component Value Date   CHOL 134 02/26/2010   CHOL 135 02/28/2009   CHOL 131 10/21/2007   Lab Results  Component Value Date   HDL 47.80 02/26/2010   HDL 96.04 02/28/2009   HDL 54.0 10/21/2007   Lab Results  Component Value Date   LDLCALC 80 02/26/2010   LDLCALC 74 02/28/2009   LDLCALC 78 10/21/2007   Lab Results  Component Value Date   TRIG 29.0 02/26/2010   TRIG 41.0 02/28/2009   TRIG 52 10/21/2007   Lab Results  Component Value Date   CHOLHDL 3 02/26/2010   CHOLHDL 3 02/28/2009   CHOLHDL 3.1 CALC 10/21/2007   Lab Results  Component Value Date   HGBA1C 6.3 02/26/2010   Lab Results  Component Value Date   ALT 28 02/26/2010   AST 25 02/26/2010   ALKPHOS 41 02/26/2010   BILITOT 0.5 02/26/2010   Lab Results  Component Value Date   TSH 1.77 02/26/2010

## 2011-07-03 NOTE — Assessment & Plan Note (Signed)
Repeat check was 140/80. He usually well controlled. Followup with Dr. Debby Bud.

## 2011-07-06 LAB — DIFFERENTIAL
Basophils Absolute: 0
Eosinophils Absolute: 0.2
Eosinophils Relative: 7 — ABNORMAL HIGH
Monocytes Absolute: 0.4

## 2011-07-06 LAB — URINE MICROSCOPIC-ADD ON

## 2011-07-06 LAB — CBC
HCT: 32.1 — ABNORMAL LOW
MCV: 87.6
Platelets: 149 — ABNORMAL LOW
RDW: 14.5

## 2011-07-06 LAB — URINE CULTURE: Colony Count: 4000

## 2011-07-06 LAB — SAMPLE TO BLOOD BANK

## 2011-07-06 LAB — URINALYSIS, ROUTINE W REFLEX MICROSCOPIC
Bilirubin Urine: NEGATIVE
Nitrite: NEGATIVE
Specific Gravity, Urine: >1.046 — ABNORMAL HIGH
pH: 5.5

## 2011-07-06 LAB — I-STAT 8, (EC8 V) (CONVERTED LAB)
Acid-Base Excess: 4 — ABNORMAL HIGH
Bicarbonate: 28.9 — ABNORMAL HIGH
Glucose, Bld: 106 — ABNORMAL HIGH
Potassium: 4
TCO2: 30
pH, Ven: 7.416 — ABNORMAL HIGH

## 2011-07-06 LAB — APTT: aPTT: 51 — ABNORMAL HIGH

## 2011-07-11 ENCOUNTER — Inpatient Hospital Stay (INDEPENDENT_AMBULATORY_CARE_PROVIDER_SITE_OTHER)
Admission: RE | Admit: 2011-07-11 | Discharge: 2011-07-11 | Disposition: A | Discharge status: Home / Self Care | Payer: Medicare Other | Source: Ambulatory Visit | Attending: Emergency Medicine | Admitting: Emergency Medicine

## 2011-07-11 DIAGNOSIS — J019 Acute sinusitis, unspecified: Secondary | ICD-10-CM

## 2011-07-17 LAB — I-STAT 8, (EC8 V) (CONVERTED LAB)
Acid-Base Excess: 1
Bicarbonate: 23.9
Chloride: 109
HCT: 43
Operator id: 294341
pCO2, Ven: 33.8 — ABNORMAL LOW
pH, Ven: 7.456 — ABNORMAL HIGH

## 2011-07-17 LAB — POCT I-STAT CREATININE
Creatinine, Ser: 1.1
Operator id: 294341

## 2011-07-17 LAB — POCT CARDIAC MARKERS
CKMB, poc: 3.3
Myoglobin, poc: 138
Myoglobin, poc: 92.8
Operator id: 270651
Troponin i, poc: <0.05
Troponin i, poc: <0.05

## 2011-07-17 LAB — CBC
HCT: 40.9
Hemoglobin: 13.9
MCHC: 34
MCV: 88.9
RBC: 4.59
WBC: 4.1

## 2011-07-17 LAB — DIFFERENTIAL
Basophils Relative: 1
Eosinophils Absolute: 0.2
Eosinophils Relative: 5
Lymphs Abs: 0.9
Monocytes Absolute: 0.5
Monocytes Relative: 13 — ABNORMAL HIGH
Neutrophils Relative %: 61

## 2011-07-17 LAB — PROTIME-INR: INR: 1

## 2011-08-25 ENCOUNTER — Telehealth: Payer: Self-pay | Admitting: *Deleted

## 2011-08-25 NOTE — Telephone Encounter (Signed)
Patient would like to speak with you concerning setting an appt date for Colonoscopy w/Dr Jarold Motto after being seen in cardiology [facility] he has done] and would like to discuss that w/you.

## 2011-08-25 NOTE — Telephone Encounter (Signed)
Called - left msg on home and cell phones. Asked he call back next Tuesday

## 2011-08-28 ENCOUNTER — Encounter: Payer: Self-pay | Admitting: *Deleted

## 2011-08-31 ENCOUNTER — Ambulatory Visit (INDEPENDENT_AMBULATORY_CARE_PROVIDER_SITE_OTHER): Payer: Medicare Other | Admitting: Gastroenterology

## 2011-08-31 ENCOUNTER — Encounter: Payer: Self-pay | Admitting: Gastroenterology

## 2011-08-31 VITALS — BP 136/84 | HR 60 | Ht 70.0 in | Wt 265.0 lb

## 2011-08-31 DIAGNOSIS — K219 Gastro-esophageal reflux disease without esophagitis: Secondary | ICD-10-CM

## 2011-08-31 DIAGNOSIS — I4891 Unspecified atrial fibrillation: Secondary | ICD-10-CM

## 2011-08-31 DIAGNOSIS — Z1211 Encounter for screening for malignant neoplasm of colon: Secondary | ICD-10-CM

## 2011-08-31 DIAGNOSIS — Z7901 Long term (current) use of anticoagulants: Secondary | ICD-10-CM

## 2011-08-31 NOTE — Patient Instructions (Signed)
Your physician has requested that you go to the basement for the following lab work before leaving today: IFOB Please call us back in February after your cardiologist appointment as per Dr Norval Gable request. CC: Dr Debby Bud

## 2011-08-31 NOTE — Progress Notes (Signed)
History of Present Illness:  This is a 77 year old Caucasian male with recurrent atrial fibrillation-atrial flutter with had 4 cardiac ablations at [facility]. He currently denies any GI, cardiovascular , or pulmonary complaints. He has regular bowel movements without melena or hematochezia. Last colonoscopy was 10 years ago. Patient is currently on Pradaxa 150 mg twice a day. MI history is noncontributory. Did see him several years ago for acid reflux and ulcerative esophagitis. At that time he underwent esophageal dilatation, and is currently asymptomatic on Nexium 40 mg a day. He does have hemorrhoids with when necessary Anusol HC suppository use.  I have reviewed this patient's present history, medical and surgical past history, allergies and medications.     ROS: The remainder of the 10 point ROS is negative     Physical Exam: General well developed well nourished patient in no acute distress, appearing his stated age Skin no lesions noted Neck supple, no adenopathy, no thyroid enlargement, no tenderness Chest clear to percussion and auscultation Heart no significant murmurs, gallops or rubs noted Abdomen no hepatosplenomegaly masses or tenderness, BS normal.  Extremities no acute joint lesions, edema, phlebitis or evidence of cellulitis. Neurologic patient oriented x 3, cranial nerves intact, no focal neurologic deficits noted. Psychological mental status normal and normal affect.  Assessment and plan: It is time for his 10 year screening colonoscopy. He has an appointment with cardiology in February, and if he is off anticoagulants, room proceed with colonoscopy exam. He is to return IFOB cards for evaluation. I've advised him to continue his Nexium and standard antireflux maneuvers, but to continue all other medications as listed and reviewed.  Encounter Diagnosis  Name Primary?  . Screening for colon cancer Yes

## 2011-09-02 ENCOUNTER — Other Ambulatory Visit: Payer: Medicare Other

## 2011-09-02 ENCOUNTER — Other Ambulatory Visit: Payer: Self-pay | Admitting: Gastroenterology

## 2011-09-02 DIAGNOSIS — Z1211 Encounter for screening for malignant neoplasm of colon: Secondary | ICD-10-CM

## 2011-09-02 LAB — FECAL OCCULT BLOOD, IMMUNOCHEMICAL: Fecal Occult Bld: NEGATIVE

## 2011-11-18 DIAGNOSIS — I498 Other specified cardiac arrhythmias: Secondary | ICD-10-CM | POA: Diagnosis not present

## 2011-11-18 DIAGNOSIS — I4891 Unspecified atrial fibrillation: Secondary | ICD-10-CM | POA: Diagnosis not present

## 2011-11-23 ENCOUNTER — Telehealth: Payer: Self-pay | Admitting: Internal Medicine

## 2011-11-23 MED ORDER — DABIGATRAN ETEXILATE MESYLATE 150 MG PO CAPS: 150.0000 mg | ORAL_CAPSULE | Freq: Two times a day (BID) | ORAL | Status: DC

## 2011-11-23 NOTE — Telephone Encounter (Signed)
Will need OV for follow-up of BP.  OK to refill pradaxa

## 2011-11-23 NOTE — Telephone Encounter (Signed)
Rx Done. Pt scheduled Wed 02.13.13 for BP management.

## 2011-11-23 NOTE — Telephone Encounter (Signed)
Pt states Dr Daleen Squibb will no longer follow BP that primary care will need to do so  req Rx of Pradaxa 150mg  1 tab BID, need 90 day supply

## 2011-11-25 ENCOUNTER — Encounter: Payer: Self-pay | Admitting: Internal Medicine

## 2011-11-25 ENCOUNTER — Ambulatory Visit (INDEPENDENT_AMBULATORY_CARE_PROVIDER_SITE_OTHER): Payer: Medicare Other | Admitting: Internal Medicine

## 2011-11-25 DIAGNOSIS — I10 Essential (primary) hypertension: Secondary | ICD-10-CM | POA: Diagnosis not present

## 2011-11-25 NOTE — Patient Instructions (Signed)
BP Readings from Last 3 Encounters:  11/25/11 128/88  08/31/11 136/84  07/03/11 158/76  two out of three readings in the office are good. Looking at readings going back several years you are usually adequately controlled. Plan - continue diovan 320. Monitor BP at home - if you start to have a systolic pressure that is consistently running 140+ will consider adding a low dose diuretic.

## 2011-11-26 ENCOUNTER — Telehealth: Payer: Self-pay | Admitting: Gastroenterology

## 2011-11-26 NOTE — Telephone Encounter (Signed)
Since his last office visit his cardiologist Dr Bedelia Person at [facility] decided to keep him on Pradaxa. But per pt he states that Dr Deno Lunger is ok holding pradaxa for one week, then restarting it. Would you like to schedule pt for direct Colon and get clearance from his cardiologist or have him come for another ov with you? Dr Deno Lunger phone number is [phone].

## 2011-11-27 NOTE — Telephone Encounter (Signed)
Direct,,I have seen him recently...hold pradaxa 5 days before

## 2011-11-27 NOTE — Assessment & Plan Note (Signed)
BP Readings from Last 3 Encounters:  11/25/11 128/88  08/31/11 136/84  07/03/11 158/76   Last two readings are very reasonable. He does have BP at home that can be higher.  Plan- continue present medication - Diovan 320.          Monitor BP at home - report back for SBP consistently greater than 140.

## 2011-11-27 NOTE — Progress Notes (Signed)
Subjective:    Patient ID: Guy Moore, male    DOB: [birth year], 78 y.o.   MRN: [id number]  HPI Guy Moore presents for BP follow-up. Dr. Daleen Squibb had referred him for routine management of his blood pressure. He has a complex cardiac h/o including ablation procedure(s) at [facility]. He continues to be seen at [facility] on a regular basis. He takes a single agent for BP. His home readings indicate mild excursions. He has not had any significant symptoms. He reports that he is feeling well.  Past Medical History  Diagnosis Date  . Atrial fibrillation   . Atrial flutter   . HTN (hypertension)   . Other and unspecified hyperlipidemia   . Shortness of breath   . Poisoning by electrolytic, caloric, and water-balance agents   . Type II or unspecified type diabetes mellitus without mention of complication, not stated as uncontrolled     Diet control   . Hyperglycemia   . BCC (basal cell carcinoma of skin)     Nose  . Knee problem     2% permanent partial impairment Right  . Gastric ulcer   . Hiatal hernia   . Allergic rhinitis   . Hearing loss     uses amplification  . Diverticulosis   . Erosive esophagitis   . Esophageal stricture   . Arthritis   . Depression   . Sleep apnea   . Hemorrhoids    Past Surgical History  Procedure Date  . Uvulopalatopharyngoplasty 1980's  . Vasectomy 1970's    w/complications  . Cardiac electrophysiology study and ablation     x 4   . Nose surgery    Family History  Problem Relation Age of Onset  . Stroke Mother   . Breast cancer Mother   . Heart disease Father   . Hypertension Father   . Cancer Father     Bladder  . Breast cancer Brother   . Atrial fibrillation Brother   . Tremor Brother   . Stroke Brother   . Colon cancer Neg Hx    History   Social History  . Marital Status: Married    Spouse Name: N/A    Number of Children: N/A  . Years of Education: N/A   Occupational History  . Retired at 19[employer]   . 27 years [employer]     . Med Engineer, manufacturing   . 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 Smoking status: Former Smoker -- 1.0 packs/day for 28 years    Types: Cigarettes    Quit date: 10/12/1977  . Smokeless tobacco: Never Used  . Alcohol Use: Yes     Occasional  . Drug Use: No  . Sexually Active: Not on file   Other Topics Concern  . Not on file   Social History Narrative   END OF LIFE CARE: he does not want CPR, short term mechanical ventilation. He does not want heroic or futile care especially if he cannon communicate, read and enjoy his loved ones.         Review of Systems System review is negative for any constitutional, cardiac, pulmonary, GI or neuro symptoms or complaints other than as described in the HPI.     Objective:   Physical Exam Filed Vitals:   11/25/11 1446  BP: 128/88  Pulse: 61  Temp: 97.6 F (36.4 C)  Resp: 16  Weight: 261 lb 8 oz (118.616 kg)  Gen'l- WNWD white man  in no distress HEENT- C&S clear Cor - 2+ radial pulse Pulm - normal respirations. Neuro - A&O x 3, normal gait, normal strength.       Assessment & Plan:

## 2011-12-01 ENCOUNTER — Encounter: Payer: Self-pay | Admitting: *Deleted

## 2011-12-01 NOTE — Telephone Encounter (Signed)
Colonoscopy scheduled for 12/07/2011 10:30am, He will come in 12/03/2011 at 9:30 and I will give him his instructions. Advised him not to take his pradaxa as of today.

## 2011-12-03 ENCOUNTER — Other Ambulatory Visit: Payer: Self-pay | Admitting: Gastroenterology

## 2011-12-04 MED ORDER — PEG-KCL-NACL-NASULF-NA ASC-C 100 G PO SOLR: 1.0000 | Freq: Once | ORAL | Status: DC

## 2011-12-04 NOTE — Telephone Encounter (Signed)
rx sent, pt aware 

## 2011-12-07 ENCOUNTER — Ambulatory Visit (AMBULATORY_SURGERY_CENTER): Payer: Medicare Other | Admitting: Gastroenterology

## 2011-12-07 ENCOUNTER — Encounter: Payer: Self-pay | Admitting: Gastroenterology

## 2011-12-07 DIAGNOSIS — Z1211 Encounter for screening for malignant neoplasm of colon: Secondary | ICD-10-CM | POA: Diagnosis not present

## 2011-12-07 DIAGNOSIS — E119 Type 2 diabetes mellitus without complications: Secondary | ICD-10-CM | POA: Diagnosis not present

## 2011-12-07 DIAGNOSIS — I4891 Unspecified atrial fibrillation: Secondary | ICD-10-CM | POA: Diagnosis not present

## 2011-12-07 DIAGNOSIS — K5730 Diverticulosis of large intestine without perforation or abscess without bleeding: Secondary | ICD-10-CM | POA: Diagnosis not present

## 2011-12-07 DIAGNOSIS — G4733 Obstructive sleep apnea (adult) (pediatric): Secondary | ICD-10-CM | POA: Diagnosis not present

## 2011-12-07 DIAGNOSIS — I10 Essential (primary) hypertension: Secondary | ICD-10-CM | POA: Diagnosis not present

## 2011-12-07 MED ORDER — SODIUM CHLORIDE 0.9 % IV SOLN: 500.0000 mL | INTRAVENOUS | Status: DC

## 2011-12-07 NOTE — Progress Notes (Signed)
Patient did not experience any of the following events: a burn prior to discharge; a fall within the facility; wrong site/side/patient/procedure/implant event; or a hospital transfer or hospital admission upon discharge from the facility. (G8907) Patient did not have preoperative order for IV antibiotic SSI prophylaxis. (G8918)  

## 2011-12-07 NOTE — Op Note (Signed)
[facility] [address] [place], [place]  [address]  COLONOSCOPY PROCEDURE REPORT  PATIENT:  Guy Moore, Guy Moore  MR#:  [id number] BIRTHDATE:  [birth year], 78 yrs. old  GENDER:  male ENDOSCOPIST:  Vania Rea. Jarold Motto, MD, [facility] REF. BY:  Rosalyn Gess. Norins, M.D. PROCEDURE DATE:  12/07/2011 PROCEDURE:  Average-risk screening colonoscopy G0121 ASA CLASS:  Class III INDICATIONS:  Routine Risk Screening MEDICATIONS:   propofol (Diprivan) 110 mg IV  DESCRIPTION OF PROCEDURE:   After the risks and benefits and of the procedure were explained, informed consent was obtained. Digital rectal exam was performed and revealed no abnormalities. The [id number] endoscope was introduced through the anus and advanced to the cecum, which was identified by both the appendix and ileocecal valve.  The quality of the prep was excellent, using MoviPrep.  The instrument was then slowly withdrawn as the colon was fully examined. <<PROCEDUREIMAGES>>  FINDINGS:  Moderate diverticulosis was found in the sigmoid to descending colon segments.  No polyps or cancers were seen. Retroflexed views in the rectum revealed no abnormalities.    The scope was then withdrawn from the patient and the procedure completed.  COMPLICATIONS:  None ENDOSCOPIC IMPRESSION: 1) Moderate diverticulosis in the sigmoid to descending colon segments 2) No polyps or cancers RECOMMENDATIONS: 1) High fiber diet. 2) metamucil or benefiber RESUME ALL MEDS  REPEAT EXAM:  No  ______________________________ Vania Rea. Jarold Motto, MD, [facility]  CC:  Gaylord Shih, MD  n. Rosalie Doctor:   Vania Rea. Edan Serratore at 12/07/2011 11:00 AM  Ileene Rubens, [id number]

## 2011-12-07 NOTE — Patient Instructions (Addendum)
RESUME YOUR MEDICATIONS TODAY. FOLLOW YOUR DISCHARGE INSTRUCTIONS.YOU HAD AN ENDOSCOPIC PROCEDURE TODAY AT THE [facility]: Refer to the procedure report that was given to you for any specific questions about what was found during the examination.  If the procedure report does not answer your questions, please call your gastroenterologist to clarify.  If you requested that your care partner not be given the details of your procedure findings, then the procedure report has been included in a sealed envelope for you to review at your convenience later.  YOU SHOULD EXPECT: Some feelings of bloating in the abdomen. Passage of more gas than usual.  Walking can help get rid of the air that was put into your GI tract during the procedure and reduce the bloating. If you had a lower endoscopy (such as a colonoscopy or flexible sigmoidoscopy) you may notice spotting of blood in your stool or on the toilet paper. If you underwent a bowel prep for your procedure, then you may not have a normal bowel movement for a few days.  DIET: Your first meal following the procedure should be a light meal and then it is ok to progress to your normal diet.  A half-sandwich or bowl of soup is an example of a good first meal.  Heavy or fried foods are harder to digest and may make you feel nauseous or bloated.  Likewise meals heavy in dairy and vegetables can cause extra gas to form and this can also increase the bloating.  Drink plenty of fluids but you should avoid alcoholic beverages for 24 hours.  ACTIVITY: Your care partner should take you home directly after the procedure.  You should plan to take it easy, moving slowly for the rest of the day.  You can resume normal activity the day after the procedure however you should NOT DRIVE or use heavy machinery for 24 hours (because of the sedation medicines used during the test).    SYMPTOMS TO REPORT IMMEDIATELY: A gastroenterologist can be reached at any hour.  During  normal business hours, 8:30 AM to 5:00 PM Monday through Friday, call [phone].  After hours and on weekends, please call the GI answering service at [phone] who will take a message and have the physician on call contact you.   Following lower endoscopy (colonoscopy or flexible sigmoidoscopy):  Excessive amounts of blood in the stool  Significant tenderness or worsening of abdominal pains  Swelling of the abdomen that is new, acute  Fever of 100F or higher  Following upper endoscopy (EGD)  Vomiting of blood or coffee ground material  New chest pain or pain under the shoulder blades  Painful or persistently difficult swallowing  New shortness of breath  Fever of 100F or higher  Black, tarry-looking stools  FOLLOW UP: If any biopsies were taken you will be contacted by phone or by letter within the next 1-3 weeks.  Call your gastroenterologist if you have not heard about the biopsies in 3 weeks.  Our staff will call the home number listed on your records the next business day following your procedure to check on you and address any questions or concerns that you may have at that time regarding the information given to you following your procedure. This is a courtesy call and so if there is no answer at the home number and we have not heard from you through the emergency physician on call, we will assume that you have returned to your regular daily activities without  incident.  SIGNATURES/CONFIDENTIALITY: You and/or your care partner have signed paperwork which will be entered into your electronic medical record.  These signatures attest to the fact that that the information above on your After Visit Summary has been reviewed and is understood.  Full responsibility of the confidentiality of this discharge information lies with you and/or your care-partner.

## 2011-12-08 ENCOUNTER — Telehealth: Payer: Self-pay

## 2011-12-08 NOTE — Telephone Encounter (Signed)
  Follow up Call-  Call back number 12/07/2011  Post procedure Call Back phone  # [phone]  Permission to leave phone message Yes     Patient questions:  Do you have a fever, pain , or abdominal swelling? no Pain Score  0 *  Have you tolerated food without any problems? yes  Have you been able to return to your normal activities? yes  Do you have any questions about your discharge instructions: Diet   no Medications  no Follow up visit  no  Do you have questions or concerns about your Care? no  Actions: * If pain score is 4 or above: No action needed, pain <4.  "I'm doing fine and was pleased with the outcome" per the pt. Maw

## 2011-12-14 ENCOUNTER — Telehealth: Payer: Self-pay | Admitting: Cardiology

## 2011-12-14 ENCOUNTER — Encounter: Payer: Self-pay | Admitting: Physician Assistant

## 2011-12-14 ENCOUNTER — Ambulatory Visit (INDEPENDENT_AMBULATORY_CARE_PROVIDER_SITE_OTHER): Payer: Medicare Other | Admitting: Physician Assistant

## 2011-12-14 VITALS — BP 140/88 | HR 101 | Ht 70.0 in | Wt 259.8 lb

## 2011-12-14 DIAGNOSIS — I4891 Unspecified atrial fibrillation: Secondary | ICD-10-CM

## 2011-12-14 LAB — BASIC METABOLIC PANEL
BUN: 18 mg/dL (ref 6–23)
Chloride: 106 mEq/L (ref 96–112)
Glucose, Bld: 99 mg/dL (ref 70–99)
Potassium: 4.3 mEq/L (ref 3.5–5.1)
Sodium: 142 mEq/L (ref 135–145)

## 2011-12-14 MED ORDER — METOPROLOL TARTRATE 25 MG PO TABS: 25.0000 mg | ORAL_TABLET | Freq: Two times a day (BID) | ORAL | Status: DC

## 2011-12-14 NOTE — Telephone Encounter (Signed)
Pt reporting a rapid, irregular heart rate with a little sob.  He is requesting an ekg and wants someone to "listen to his heart".  Appt scheduled with Tereso Newcomer, PA-C today.

## 2011-12-14 NOTE — Telephone Encounter (Signed)
New problem:  Patient calling would like to have an EKG done today - fax over to [facility]. Patient been treat from arrythmia's at [facility] for about 6 months,.

## 2011-12-14 NOTE — Progress Notes (Addendum)
[address] [place], [place]  [address] Phone: [phone] Fax:  [phone]  Date:  12/14/2011   Name:  Guy Moore       DOB:  [birth year] MRN:  [id number]  PCP:  Dr. Debby Bud Primary Cardiologist:  Dr. Abundio Miu at [facility] (previously Dr. Valera Castle) Leotis Shames, PA-C Phone # [phone] Fax # [phone] Primary Electrophysiologist:  Dr. Abundio Miu at [facility]   History of Present Illness: Guy Moore is a 78 y.o. male who returns for recurrent AFib.  He has a long history of atrial fibrillation.  He has had multiple ablation procedures at [facility].  His first was in 2007 and again in 2008.  He had atrial flutter ablation in 2009.  He developed atrial tachycardia and underwent ablation in 2012.  Tikosyn was discontinued in the fall of last year.  Nuclear study 2005: Negative for ischemia.  Transesophageal Echocardiogram 6/07: EF 55%, mild MR, mild LAE, mild RVE, mild RAE.  Other history includes hypertension, hyperlipidemia and borderline diabetes.  He underwent colonoscopy one week ago.  He was off of his Pradaxa for 5 days prior to this.  He restarted his Pradaxa that evening (2/25).  He was doing well up until this morning.  He awoke with feelings of dizziness and fatigue.  He checked his pulse and noted that it was irregular.  He spoke to the PA at [facility].  They are going to see him this Friday.  They asked that he get an EKG today.  He notes chest tightness and dyspnea.  This is very similar to the symptoms he has had in the past with his atrial fibrillation.  He does note some gas and feelings of needing to belch.  This is fairly new.  Pradaxa seems to increase abdominal discomfort for him.  He denies orthopnea, PND or edema.  He denies syncope or near-syncope.  Past Medical History  Diagnosis Date  . Atrial fibrillation   . Atrial flutter   . HTN (hypertension)   . Other and unspecified hyperlipidemia   . Shortness of breath   . Poisoning by  electrolytic, caloric, and water-balance agents   . Type II or unspecified type diabetes mellitus without mention of complication, not stated as uncontrolled     Diet control   . Hyperglycemia   . BCC (basal cell carcinoma of skin)     Nose  . Knee problem     2% permanent partial impairment Right  . Gastric ulcer   . Hiatal hernia   . Allergic rhinitis   . Hearing loss     uses amplification  . Diverticulosis   . Erosive esophagitis   . Esophageal stricture   . Arthritis   . Depression   . Sleep apnea   . Hemorrhoids     Current Outpatient Prescriptions  Medication Sig Dispense Refill  . dabigatran (PRADAXA) 150 MG CAPS Take 1 capsule (150 mg total) by mouth every 12 (twelve) hours.  180 capsule  0  . DIOVAN 320 MG tablet TAKE 1 TABLET BY MOUTH AT BEDTIME  90 tablet  1  . hydrocortisone (ANUSOL-HC) 25 MG suppository Place 25 mg rectally as needed.       Marland Kitchen MAGNESIUM GLUCONATE PO Take by mouth daily.        . Methylcellulose, Laxative, (CITRUCEL PO) Take by mouth as needed.        Marland Kitchen NEXIUM 40 MG capsule TAKE 1 CAPSULE BY MOUTH EVERY MORNING  90 capsule  2  . potassium chloride SA (K-DUR,KLOR-CON) 20 MEQ tablet TAKE 1 TABLET BY MOUTH ONCE A DAY  90 tablet  2  . pravastatin (PRAVACHOL) 40 MG tablet TAKE 1 TABLET BY MOUTH AT BEDTIME  90 tablet  2    Allergies: No Known Allergies  History  Substance Use Topics  . Smoking status: Former Smoker -- 1.0 packs/day for 28 years    Types: Cigarettes    Quit date: 10/12/1977  . Smokeless tobacco: Never Used  . Alcohol Use: Yes     Occasional     ROS:  Please see the history of present illness.   No fevers, chills, cough, melena, hematochezia.  All other systems reviewed and negative.   PHYSICAL EXAM: VS:  BP 140/88  Pulse 101  Ht 5\' 10"  (1.778 m)  Wt 259 lb 12.8 oz (117.845 kg)  BMI 37.28 kg/m2 Well nourished, well developed, in no acute distress HEENT: normal Neck: no JVD Vascular: No carotid bruits Cardiac:  normal S1,  S2; Irregularly irregular; no murmur Lungs:  clear to auscultation bilaterally, no wheezing, rhonchi or rales Abd: soft, nontender, no hepatomegaly Ext: no edema Skin: warm and dry Neuro:  CNs 2-12 intact, no focal abnormalities noted Psych: Normal affect  EKG:  Atrial fibrillation, heart rate 101, normal axis, nonspecific ST-T wave changes  ASSESSMENT AND PLAN:  1. Atrial fibrillation  He is back in atrial fibrillation.  He is somewhat symptomatic.  He is having symptoms like what he has had in the past.   His heart rate is uncontrolled.  He has a long h/o of this with chronic follow up at [facility].  He has a follow up appointment with his physician at [facility] (Dr. Dennison Nancy) this Friday.  I discussed his case today with Dr. Ladona Ridgel (Electrophysiologist) who is in the office today.  I will place him on metoprolol 25 mg twice a day.  He will continue on Pradaxa.  He will keep his appointment later this week at [facility].  The patient is concerned about his electrolytes.  We will check a basic metabolic panel today.  I will also check his TSH today.  He can follow up with Dr. Daleen Squibb as needed.     Signed, Tereso Newcomer, PA-C  10:31 AM 12/14/2011

## 2011-12-14 NOTE — Patient Instructions (Signed)
Your physician recommends that you have lab work drawn today (BMP, TSH) Your physician has recommended you make the following change in your medication: START Metoprolol 25 mg twice daily

## 2011-12-21 DIAGNOSIS — I498 Other specified cardiac arrhythmias: Secondary | ICD-10-CM | POA: Diagnosis not present

## 2011-12-21 DIAGNOSIS — I4891 Unspecified atrial fibrillation: Secondary | ICD-10-CM | POA: Diagnosis not present

## 2011-12-21 DIAGNOSIS — Z79899 Other long term (current) drug therapy: Secondary | ICD-10-CM | POA: Diagnosis not present

## 2012-01-18 ENCOUNTER — Other Ambulatory Visit: Payer: Self-pay | Admitting: Internal Medicine

## 2012-02-24 ENCOUNTER — Encounter: Payer: Medicare Other | Admitting: Internal Medicine

## 2012-02-26 DIAGNOSIS — I4891 Unspecified atrial fibrillation: Secondary | ICD-10-CM | POA: Diagnosis not present

## 2012-02-26 DIAGNOSIS — I498 Other specified cardiac arrhythmias: Secondary | ICD-10-CM | POA: Diagnosis not present

## 2012-03-08 ENCOUNTER — Other Ambulatory Visit: Payer: Self-pay | Admitting: Internal Medicine

## 2012-03-10 ENCOUNTER — Encounter: Payer: Self-pay | Admitting: Internal Medicine

## 2012-03-10 ENCOUNTER — Ambulatory Visit (INDEPENDENT_AMBULATORY_CARE_PROVIDER_SITE_OTHER): Payer: Medicare Other | Admitting: Internal Medicine

## 2012-03-10 ENCOUNTER — Other Ambulatory Visit (INDEPENDENT_AMBULATORY_CARE_PROVIDER_SITE_OTHER): Payer: Medicare Other

## 2012-03-10 VITALS — BP 142/82 | HR 64 | Temp 98.1°F | Resp 16 | Wt 257.0 lb

## 2012-03-10 DIAGNOSIS — I10 Essential (primary) hypertension: Secondary | ICD-10-CM | POA: Diagnosis not present

## 2012-03-10 DIAGNOSIS — E785 Hyperlipidemia, unspecified: Secondary | ICD-10-CM

## 2012-03-10 DIAGNOSIS — IMO0001 Reserved for inherently not codable concepts without codable children: Secondary | ICD-10-CM

## 2012-03-10 DIAGNOSIS — Z0000 Encounter for general adult medical examination without abnormal findings: Secondary | ICD-10-CM

## 2012-03-10 DIAGNOSIS — E119 Type 2 diabetes mellitus without complications: Secondary | ICD-10-CM

## 2012-03-10 DIAGNOSIS — G4733 Obstructive sleep apnea (adult) (pediatric): Secondary | ICD-10-CM

## 2012-03-10 DIAGNOSIS — I4891 Unspecified atrial fibrillation: Secondary | ICD-10-CM

## 2012-03-10 DIAGNOSIS — Z136 Encounter for screening for cardiovascular disorders: Secondary | ICD-10-CM | POA: Diagnosis not present

## 2012-03-10 LAB — MAGNESIUM: Magnesium: 1.9 mg/dL (ref 1.5–2.5)

## 2012-03-10 LAB — LIPID PANEL
Cholesterol: 117 mg/dL (ref 0–200)
HDL: 57.5 mg/dL (ref >39.00)
VLDL: 3.4 mg/dL (ref 0.0–40.0)

## 2012-03-10 LAB — COMPREHENSIVE METABOLIC PANEL
Alkaline Phosphatase: 38 U/L — ABNORMAL LOW (ref 39–117)
BUN: 18 mg/dL (ref 6–23)
Creatinine, Ser: 1.1 mg/dL (ref 0.4–1.5)
Glucose, Bld: 104 mg/dL — ABNORMAL HIGH (ref 70–99)
Total Bilirubin: 0.6 mg/dL (ref 0.3–1.2)

## 2012-03-10 LAB — HEPATIC FUNCTION PANEL
ALT: 28 U/L (ref 0–53)
AST: 27 U/L (ref 0–37)
Bilirubin, Direct: 0.1 mg/dL (ref 0.0–0.3)
Total Protein: 6.8 g/dL (ref 6.0–8.3)

## 2012-03-10 LAB — HEMOGLOBIN A1C: Hgb A1c MFr Bld: 6.2 % (ref 4.6–6.5)

## 2012-03-10 NOTE — Progress Notes (Signed)
Subjective:    Patient ID: Guy Moore, male    DOB: [birth year], 78 y.o.   MRN: [id number]  HPI Guy Moore is here for annual Medicare wellness examination and management of other chronic and acute problems.  He reports that over the past several days he has noted irregular heart beats. He has a h/o a. Fib/flutter s/p ablation x 4 at [facility]. He has been in sinus rhythm and doing well. What he has noticed, mostly at night and in the early morning, has been irregular beats with intermittent pauses. He has not had any syncope, chest pain, SOB. He has not had a sustained rapid heart rate. He has not yet in been contact with Dr. Tristan Schroeder, his EP Doctor at [facility].   The risk factors are reflected in the social history.  The roster of all physicians providing medical care to patient - is listed in the Snapshot section of the chart.  Activities of daily living:  The patient is 100% inedpendent in all ADLs: dressing, toileting, feeding as well as independent mobility  Home safety : The patient has smoke detectors in the home. They wear seatbelts. There is no violence in the home.   There is no risks for hepatitis, STDs or HIV. There is no   history of blood transfusion. They have no travel history to infectious disease endemic areas of the world.  The patient has seen their dentist in the last six month. They have seen their eye doctor in the last year. They deny any hearing difficulty and have not had audiologic testing in the last year.  They do not  have excessive sun exposure. Discussed the need for sun protection: hats, long sleeves and use of sunscreen if there is significant sun exposure.   Diet: the importance of a healthy diet is discussed. They do have a healthy (unhealthy-high fat/fast food) diet.  The patient has no regular exercise program.  The benefits of regular aerobic exercise were discussed.  Depression screen: there are no signs or vegative symptoms of depression- irritability,  change in appetite, anhedonia, sadness/tearfullness.  Cognitive assessment: the patient manages all their financial and personal affairs and is actively engaged.   The following portions of the patient's history were reviewed and updated as appropriate: allergies, current medications, past family history, past medical history,  past surgical history, past social history  and problem list.  Vision, hearing, body mass index were assessed and reviewed.   During the course of the visit the patient was educated and counseled about appropriate screening and preventive services including : fall prevention , diabetes screening, nutrition counseling, colorectal cancer screening, and recommended immunizations.  Past Medical History  Diagnosis Date  . Atrial fibrillation   . Atrial flutter   . HTN (hypertension)   . Other and unspecified hyperlipidemia   . Shortness of breath   . Poisoning by electrolytic, caloric, and water-balance agents   . Type II or unspecified type diabetes mellitus without mention of complication, not stated as uncontrolled     Diet control   . Hyperglycemia   . BCC (basal cell carcinoma of skin)     Nose  . Knee problem     2% permanent partial impairment Right  . Gastric ulcer   . Hiatal hernia   . Allergic rhinitis   . Hearing loss     uses amplification  . Diverticulosis   . Erosive esophagitis   . Esophageal stricture   . Arthritis   . Depression   .  Sleep apnea   . Hemorrhoids    Past Surgical History  Procedure Date  . Uvulopalatopharyngoplasty 1980's  . Vasectomy 1970's    w/complications  . Cardiac electrophysiology study and ablation     x 4   . Nose surgery    Family History  Problem Relation Age of Onset  . Stroke Mother   . Breast cancer Mother   . Heart disease Father   . Hypertension Father   . Cancer Father     Bladder  . Breast cancer Brother   . Atrial fibrillation Brother   . Tremor Brother   . Stroke Brother   . Colon cancer  Neg Hx    History   Social History  . Marital Status: Married    Spouse Name: N/A    Number of Children: N/A  . Years of Education: N/A   Occupational History  . Retired at 51[employer]   . 27 years [employer]   . Med Engineer, manufacturing   . 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 Smoking status: Former Smoker -- 1.0 packs/day for 28 years    Types: Cigarettes    Quit date: 10/12/1977  . Smokeless tobacco: Never Used  . Alcohol Use: Yes     Occasional  . Drug Use: No  . Sexually Active: Not on file   Other Topics Concern  . Not on file   Social History Narrative   END OF LIFE CARE: he does not want CPR, short term mechanical ventilation. He does not want heroic or futile care especially if he cannon communicate, read and enjoy his loved ones.        Review of Systems Constitutional:  Negative for fever, chills, activity change and unexpected weight change.  HEENT:  Negative for hearing loss, ear pain, congestion, neck stiffness and postnasal drip. Negative for sore throat or swallowing problems. Negative for dental complaints.   Eyes: Negative for vision loss or change in visual acuity.  Respiratory: Negative for chest tightness and wheezing. Negative for DOE.   Cardiovascular: Negative for chest pain. No decreased exercise tolerance. Positive for palpitations Gastrointestinal: No change in bowel habit. No bloating or gas. No reflux or indigestion Genitourinary: Negative for urgency, frequency, flank pain and difficulty urinating.  Musculoskeletal: Negative for myalgias, back pain, arthralgias and gait problem.  Neurological: Negative for dizziness, tremors, weakness and headaches.  Hematological: Negative for adenopathy.  Psychiatric/Behavioral: Negative for behavioral problems and dysphoric mood.       Objective:   Physical Exam Filed Vitals:   03/10/12 0906  BP: 142/82  Pulse: 64  Temp: 98.1 F (36.7 C)  Resp: 16   Wt  Readings from Last 3 Encounters:  03/10/12 257 lb (116.574 kg)  12/14/11 259 lb 12.8 oz (117.845 kg)  12/07/11 265 lb (120.203 kg)   Gen'l- overweight white man in no distress HEENT - [place]/AT, C&S clear, EACs/TMs normal, no oral lesions. Neck- supple, no thyromegaly Nodes - negative submandibular and cervical regioins Cor - 2+ radial and DP pulses, no JVD, no Carotid bruits. Regular rate with rare PVCs. II/VI soft systolic murmur Pulm - normal respirations, no rales or wheezes Abd- obese, BS+, Soft, no guarding or rebound Genitalia deferred Ext - no deformity, MAE Neuro - A&O x 3, CN II-XII intact, cerebellar - normal gait and station, MS - normal Derm - deferred to dermatology  Lab Results  Component Value Date   WBC 3.5* 04/07/2010   HGB  13.4 04/07/2010   HCT 38.7* 04/07/2010   PLT 134.0* 04/07/2010   GLUCOSE 104* 03/10/2012   CHOL 117 03/10/2012   TRIG 17.0 03/10/2012   HDL 57.50 03/10/2012   LDLCALC 56 03/10/2012        ALT 28 03/10/2012   AST 27 03/10/2012        NA 143 03/10/2012   K 4.2 03/10/2012   CL 106 03/10/2012   CREATININE 1.1 03/10/2012   BUN 18 03/10/2012   CO2 31 03/10/2012   TSH 1.18 03/10/2012   PSA 1.28 02/26/2010   INR 1.3 03/31/2010   HGBA1C 6.2 03/10/2012   MICROALBUR 0.7 02/26/2010           Assessment & Plan:

## 2012-03-11 NOTE — Assessment & Plan Note (Signed)
BP Readings from Last 3 Encounters:  03/10/12 142/82  12/14/11 140/88  12/07/11 140/80   Borderline control.  Plan Monitor BP at home. Report back if SBP is consistently 140+ for which medications will be adjusted

## 2012-03-11 NOTE — Assessment & Plan Note (Signed)
Long and complex h/o arythmias with multiple ablations procedures. Now with palpitations. EKG reveals sinus rhythm with PVCs - rare. Rhythm strip likewise with NSR. Suspect PVCs w/ compensatory pause. No report of irritants: caffeine, alcohol, decongestants or cold medicines.   Plan No change in medications  Copies of EKG and rhythm strip faxed to Dr. Tristan Schroeder at [facility]

## 2012-03-11 NOTE — Assessment & Plan Note (Signed)
Interval hx notable for new on-set palpitations, most likely PVC's with compensatory pause. No other medical events. Physical exam noteworthy for weight issues - he is counseled in weight management: smart food choices, PORTION SIZE CONTROL, regular aerobic exercise with a goal of loosing 1-2 lbs/month and a target weight of 200 lbx, a 30-60 month project. He is current with colorectal cancer screening. Discussed pros and cons of prostate cancer screening (USPHCTF & ACU recommendations reviewed) and he defers evaluation at this time.  In summary- a nice man who is medically stable. He was not in a fib or flutter at today's exam. He will follow-up with Dr. Tristan Schroeder at [facility]. He will return to IM in 1 year or sooner as needed.

## 2012-03-11 NOTE — Assessment & Plan Note (Signed)
Lab reveals HDL and LDL to be better than goal. Liver functions are normal.  Plan - continue present medications

## 2012-03-11 NOTE — Assessment & Plan Note (Signed)
Last OV with Dr. Shelle Iron - August '12. He has been doing well.  Plan  Continue present treatment (CPAP)  See Dr. Shelle Iron for annual follow-up in August '13

## 2012-03-11 NOTE — Assessment & Plan Note (Signed)
No report of symptoms of hypo or hyperglycemia. A1C is normal  Plan- continued dietary management.

## 2012-04-08 ENCOUNTER — Other Ambulatory Visit: Payer: Self-pay | Admitting: *Deleted

## 2012-04-08 ENCOUNTER — Telehealth: Payer: Self-pay | Admitting: Internal Medicine

## 2012-04-08 MED ORDER — VALSARTAN 320 MG PO TABS: 320.0000 mg | ORAL_TABLET | Freq: Every day | ORAL | Status: DC

## 2012-04-08 MED ORDER — DABIGATRAN ETEXILATE MESYLATE 150 MG PO CAPS: 150.0000 mg | ORAL_CAPSULE | Freq: Two times a day (BID) | ORAL | Status: DC

## 2012-04-08 NOTE — Telephone Encounter (Signed)
[facility] diovan refill

## 2012-04-08 NOTE — Telephone Encounter (Signed)
Caller: /Patient; PCP: Illene Regulus; CB#: [phone];  Call regarding Prescriptions: [facility]ovan [facility] and [facility] uses [facility]- Only [facility] [address] Ordered With 3 Refills- he usually gets #90 with 3 refills . Pharmacy info changed in EPIC at bottom of med list but didn't change under individual meds ( Not sure how) [facility] notified that he will not be picking them up. ORDER NEEDS TO BE ESCRIBED TO: [facility] #[phone]- Unable to call in without MD NPI #.  JK/CAN-- Pharmacy under individual meds may need to be changed. MEDICATION QUESTIONS PROTOCOL.

## 2012-04-08 NOTE — Telephone Encounter (Signed)
[facility] Pradaxa

## 2012-04-13 ENCOUNTER — Other Ambulatory Visit: Payer: Self-pay | Admitting: *Deleted

## 2012-04-13 MED ORDER — DABIGATRAN ETEXILATE MESYLATE 150 MG PO CAPS: 150.0000 mg | ORAL_CAPSULE | Freq: Two times a day (BID) | ORAL | Status: DC

## 2012-04-13 MED ORDER — VALSARTAN 320 MG PO TABS: 320.0000 mg | ORAL_TABLET | Freq: Every day | ORAL | Status: DC

## 2012-04-13 NOTE — Telephone Encounter (Signed)
[facility]. pradaxa

## 2012-05-09 ENCOUNTER — Ambulatory Visit (INDEPENDENT_AMBULATORY_CARE_PROVIDER_SITE_OTHER): Payer: Medicare Other | Admitting: Internal Medicine

## 2012-05-09 ENCOUNTER — Encounter: Payer: Self-pay | Admitting: Internal Medicine

## 2012-05-09 ENCOUNTER — Telehealth: Payer: Self-pay | Admitting: Internal Medicine

## 2012-05-09 VITALS — BP 128/80 | HR 68 | Temp 97.8°F | Resp 16 | Wt 259.0 lb

## 2012-05-09 DIAGNOSIS — J3081 Allergic rhinitis due to animal (cat) (dog) hair and dander: Secondary | ICD-10-CM | POA: Diagnosis not present

## 2012-05-09 NOTE — Progress Notes (Signed)
  Subjective:    Patient ID: Guy Moore, male    DOB: 1934-06-17, 78 y.o.   MRN: [id number]  HPI Guy Moore presaents with a 3 day h/o right sinus congestion frontal and maxillary along with aching teeth. He has taken mucinex, had lots of fluids, question of swollen gland right submandibular. He reports some sores in the mouth. He denies any fever, chills, purulent drainage, severe headache. He has had some pain at the right TMJ and in the ear. He has had no SOB, no cough. Question of whether this is an allergic reaction to his daughter's dog.   He reports that he has been cardiac stable except for an episode of "diddly wop" heart rate.   PMH, FamHx and SocHx reviewed for any changes and relevance. Current Outpatient Prescriptions on File Prior to Visit  Medication Sig Dispense Refill  . dabigatran (PRADAXA) 150 MG CAPS Take 1 capsule (150 mg total) by mouth every 12 (twelve) hours.  180 capsule  3  . hydrocortisone (ANUSOL-HC) 25 MG suppository Place 25 mg rectally as needed.       Marland Kitchen KLOR-CON M20 20 MEQ tablet TAKE 1 TABLET BY MOUTH ONCE A DAY  90 tablet  1  . MAGNESIUM GLUCONATE PO Take by mouth daily.        . Methylcellulose, Laxative, (CITRUCEL PO) Take by mouth as needed.        Marland Kitchen NEXIUM 40 MG capsule TAKE 1 CAPSULE BY MOUTH EVERY MORNING  90 capsule  1  . pravastatin (PRAVACHOL) 40 MG tablet TAKE 1 TABLET BY MOUTH AT BEDTIME  90 tablet  1  . valsartan (DIOVAN) 320 MG tablet Take 1 tablet (320 mg total) by mouth daily.  90 tablet  3     Review of Systems System review is negative for any constitutional, cardiac, pulmonary, GI or neuro symptoms or complaints other than as described in the HPI.     Objective:   Physical Exam Filed Vitals:   05/09/12 1557  BP: 128/80  Pulse: 68  Temp: 97.8 F (36.6 C)  Resp: 16   Wt Readings from Last 3 Encounters:  05/09/12 259 lb (117.482 kg)  03/10/12 257 lb (116.574 kg)  12/14/11 259 lb 12.8 oz (117.845 kg)   BMI 37 Gen'l-  overweight white man in no distress HEENT- Right EAC clear and TM is normal; mild tenderness to percussion over the right frontal and maxillary sinuses; Throat clear. Nodes - tender in the anterior chain right but no palpable node. Pulm - normal breath sounds w/o rales, wheezes or rhonchi Neuro - A&O x 3.        Assessment & Plan:  Allergic rhinitis - no evidence of any bacterial infection, thus no indication for antibiotics  Plan  Trial of Dymista spray - one spray to right nostril twice a day  Hydrate  Allergen avoidance (daughter's dog)

## 2012-05-09 NOTE — Telephone Encounter (Signed)
Caller: Emma/Spouse; PCP: Illene Regulus; CB#: [phone]; ; ; Call regarding Sinus Congestion;  Onset- 05/06/12  Afebrile. Wife states husband has sinus drainage, congestion, swollen gland in neck and ear congestion. She is requesting an appt for him today because when he gets like this it can get bad really quick. Emergent s/s of URI s/s protocol r/o. Appt scheduled for today at 3:30pm with Dr. Debby Bud.

## 2012-05-09 NOTE — Patient Instructions (Addendum)
Sinus congestion and discomfort - no evidence of a bacterial infection, suspect this is allergic in nature. Plan: try dymista 1 spray to nostril twice a day, continue the mucinex, fluids, Vitamin C; remove the allergen (bow wow). Call if your symptoms don't improve.

## 2012-05-16 ENCOUNTER — Ambulatory Visit (INDEPENDENT_AMBULATORY_CARE_PROVIDER_SITE_OTHER): Payer: Medicare Other | Admitting: Internal Medicine

## 2012-05-16 ENCOUNTER — Encounter: Payer: Self-pay | Admitting: Internal Medicine

## 2012-05-16 ENCOUNTER — Telehealth: Payer: Self-pay

## 2012-05-16 VITALS — BP 132/72 | HR 61 | Temp 98.5°F | Ht 70.0 in | Wt 256.4 lb

## 2012-05-16 DIAGNOSIS — J320 Chronic maxillary sinusitis: Secondary | ICD-10-CM | POA: Diagnosis not present

## 2012-05-16 DIAGNOSIS — G4733 Obstructive sleep apnea (adult) (pediatric): Secondary | ICD-10-CM | POA: Diagnosis not present

## 2012-05-16 DIAGNOSIS — I4891 Unspecified atrial fibrillation: Secondary | ICD-10-CM

## 2012-05-16 DIAGNOSIS — K1120 Sialoadenitis, unspecified: Secondary | ICD-10-CM

## 2012-05-16 MED ORDER — AMOXICILLIN-POT CLAVULANATE 875-125 MG PO TABS: 1.0000 | ORAL_TABLET | Freq: Two times a day (BID) | ORAL | Status: AC

## 2012-05-16 NOTE — Assessment & Plan Note (Signed)
Long hx complex arrhythmias reviewed Stable at this time on exam and asymptomatic  Continue Pradaxa as ongoing and follow up cards as ongoing

## 2012-05-16 NOTE — Telephone Encounter (Signed)
[facility] Triage Call Report Triage Record Num: [id number] Operator: Thayer Headings Patient Name: Guy Moore Call Date & Time: 05/15/2012 1:49:57PM Patient Phone: [phone] PCP: Illene Regulus Patient Gender: Male PCP Fax : [phone] Patient DOB: 1934/03/27 Practice Name: [facility] Reason for Call: Caller: [name]/Patient; Primary care doctor: Illene Regulus; Call back #: [phone]; Calling today 05/15/12 regarding having right jaw pain, onset 05/09/12, saw Dr. Debby Bud on 05/09/12 and said suspected due to allergies and sinus congestion. Had him start Guafenisen, also nasal spray. Pt states no further problems with sinus congestion. Also uses CPAP and seems to make pain worse. Has some slight swelling on that side. Afebrile. Emergent symptoms ruled out by Jaw Symptoms guidelines with exception of worsening jaw pain that occurss with movement or inreasing jaw immobility. Care advice given. Appointment scheduled for 05/16/12 at 8:45 AM with Dr. Felicity Coyer. Protocol(s) Used: Jaw Symptoms Recommended Outcome per Protocol: See Provider within 72 Hours Reason for Outcome: Worsening jaw pain that occurs with movement or increasing jaw immobility Care Advice: ~ Call provider if symptoms worsen or new symptoms develop. ~ Avoid extreme jaw movement such as a wide yawning and gum chewing. ~ Avoid chewing on affected side and hard foods (such as ice, candy, meat or popcorn). ~ SYMPTOM / CONDITION MANAGEMENT ~ CAUTIONS Analgesic/Antipyretic Advice - Acetaminophen: Consider acetaminophen as directed on label or by pharmacist/provider for pain or fever PRECAUTIONS: - Use if there is no history of liver disease, alcoholism, or intake of three or more alcohol drinks per day - Only if approved by provider during pregnancy or when breastfeeding - During pregnancy, acetaminophen should not be taken more than 3 consecutive days without telling provider - Do not exceed recommended dose or  frequency ~ Apply local moist heat (such as a warm, wet wash cloth covered with plastic wrap) to the area for 15-20 minutes every 2-3 hours while awake. ~ 05/15/2012 2:07:40PM Page 1 of 1 [id number]

## 2012-05-16 NOTE — Assessment & Plan Note (Signed)
follow up pulm ([facility]) this week as planned Reassured ok to skip CPAP for night or two until infection and facial tenderness improved

## 2012-05-16 NOTE — Progress Notes (Signed)
Subjective:    Patient ID: Guy Moore, male    DOB: [birth year], 78 y.o.   MRN: [id number]  HPI  complains of L jaw swelling associated with pain and tenderness - maxillary tenderness with CPAP mask expressing excessive but clear saliva from L side glands with massage and warm compress  Denies trauma or tooth/dental pain Reports prior hx same, ?salivary duct stones Min improved with OTC tylenol   Past Medical History  Diagnosis Date  . Atrial fibrillation   . Atrial flutter   . HTN (hypertension)   . Other and unspecified hyperlipidemia   . Type II or unspecified type diabetes mellitus without mention of complication, not stated as uncontrolled     Diet control   . Hyperglycemia   . BCC (basal cell carcinoma of skin)     Nose  . Knee problem     2% permanent partial impairment Right  . Gastric ulcer   . Hiatal hernia   . Allergic rhinitis   . Hearing loss     uses amplification  . Diverticulosis   . Erosive esophagitis   . Esophageal stricture   . Arthritis   . Depression   . Sleep apnea   . Hemorrhoids     Review of Systems  Constitutional: Positive for fatigue.  HENT: Positive for congestion, facial swelling, sneezing, neck pain and sinus pressure. Negative for hearing loss, ear pain, sore throat, rhinorrhea, mouth sores, neck stiffness, postnasal drip and ear discharge.   Respiratory: Negative for cough and shortness of breath.   Cardiovascular: Negative for chest pain, palpitations and leg swelling.       Objective:   Physical Exam BP 132/72  Pulse 61  Temp 98.5 F (36.9 C) (Oral)  Ht 5\' 10"  (1.778 m)  Wt 256 lb 6.4 oz (116.302 kg)  BMI 36.79 kg/m2  SpO2 96% Constitutional:  He appears well-developed and well-nourished. No distress. Wife at side HENT: no TMJ sensitivity, tender over L maxillary region and mild L parotid swelling over jaw - teeth in fair repair - no salivary exudate expressed with massage Eyes: no conjunctivitis or exudative  discharge Neck: Normal range of motion. Neck supple. Mild L side anterior LAD. No JVD present. No thyromegaly present.  Cardiovascular: Normal rate, regular rhythm and normal heart sounds.  No murmur heard. no BLE edema Pulmonary/Chest: Effort normal and breath sounds normal. No respiratory distress. no wheezes. Neurological: he is alert and oriented to person, place, and time. No cranial nerve deficit. Coordination normal.  Skin: Skin is warm and dry.  No erythema or ulceration.  Psychiatric: he has a normal mood and affect. behavior is normal. Judgment and thought content normal.   Lab Results  Component Value Date   WBC 3.5* 04/07/2010   HGB 13.4 04/07/2010   HCT 38.7* 04/07/2010   PLT 134.0* 04/07/2010   GLUCOSE 104* 03/10/2012   CHOL 117 03/10/2012   TRIG 17.0 03/10/2012   HDL 57.50 03/10/2012   LDLCALC 56 03/10/2012   ALT 28 03/10/2012   ALT 28 03/10/2012   AST 27 03/10/2012   AST 27 03/10/2012   NA 143 03/10/2012   K 4.2 03/10/2012   CL 106 03/10/2012   CREATININE 1.1 03/10/2012   BUN 18 03/10/2012   CO2 31 03/10/2012   TSH 1.18 03/10/2012   PSA 1.28 02/26/2010   INR 1.3 03/31/2010   HGBA1C 6.2 03/10/2012   MICROALBUR 0.7 02/26/2010        Assessment & Plan:   L  parotitis/sialendetitis, hx same L maxillary sinus tenderness, ?infection vs inflammation  7d course Augmentin symptomatic care as ongoing with tylenol and warm compress

## 2012-05-16 NOTE — Patient Instructions (Signed)
It was good to see you today. Augmentin antibiotics 2x/day x 1 week - Your prescription(s) have been submitted to your pharmacy. Please take as directed and contact our office if you believe you are having problem(s) with the medication(s). Continue warm compress and tylenol as ongoing - call if worse or unimproved on treatment

## 2012-05-17 ENCOUNTER — Encounter: Payer: Self-pay | Admitting: Pulmonary Disease

## 2012-05-17 ENCOUNTER — Ambulatory Visit (INDEPENDENT_AMBULATORY_CARE_PROVIDER_SITE_OTHER): Payer: Medicare Other | Admitting: Pulmonary Disease

## 2012-05-17 VITALS — BP 138/70 | HR 63 | Temp 98.1°F | Ht 70.0 in | Wt 255.4 lb

## 2012-05-17 DIAGNOSIS — G4733 Obstructive sleep apnea (adult) (pediatric): Secondary | ICD-10-CM | POA: Diagnosis not present

## 2012-05-17 NOTE — Progress Notes (Signed)
  Subjective:    Patient ID: Guy Moore, male    DOB: 1934-10-03, 78 y.o.   MRN: [id number]  HPI The patient comes in today for followup of his known obstructive sleep apnea.  He has been wearing CPAP compliantly, and has been doing very well with the device.  He recently had an episode of right jaw pain which seems to have responded to antibiotics, and had to come off CPAP for short period of time.  He has now gotten back on the device and is doing well.  He is satisfied with his sleep and daytime alertness.  His weight has actually decreased 7 pounds since the last visit.   Review of Systems  Constitutional: Negative for fever and unexpected weight change.  HENT: Positive for congestion and sinus pressure. Negative for ear pain, nosebleeds, sore throat, rhinorrhea, sneezing, trouble swallowing, dental problem and postnasal drip.   Eyes: Negative for redness and itching.  Respiratory: Negative for cough, chest tightness, shortness of breath and wheezing.   Cardiovascular: Negative for palpitations and leg swelling.  Gastrointestinal: Negative for nausea and vomiting.  Genitourinary: Negative for dysuria.  Musculoskeletal: Negative for joint swelling.  Skin: Negative for rash.  Neurological: Negative for headaches.  Hematological: Does not bruise/bleed easily.  Psychiatric/Behavioral: Negative for dysphoric mood. The patient is not nervous/anxious.   All other systems reviewed and are negative.       Objective:   Physical Exam Obese male in no acute distress Nose without purulent discharge noted No skin breakdown or pressure necrosis from the CPAP mask Lower extremities with no significant edema, no cyanosis Alert, does not appear to be sleepy, moves all 4 extremities.       Assessment & Plan:

## 2012-05-17 NOTE — Assessment & Plan Note (Signed)
The patient is doing well with CPAP, and continues to feel that it helps his sleep and daytime alertness.  I've encouraged him to work aggressively on weight loss, and also keep up with his mask changes and supplies.  If he is doing well, we'll see him back in one year.

## 2012-05-17 NOTE — Patient Instructions (Addendum)
Continue with cpap, and keep up with mask changes and supplies. Work on weight loss followup with me in one year.  

## 2012-06-20 DIAGNOSIS — H3530 Unspecified macular degeneration: Secondary | ICD-10-CM | POA: Diagnosis not present

## 2012-06-27 ENCOUNTER — Telehealth: Payer: Self-pay | Admitting: Internal Medicine

## 2012-06-27 NOTE — Telephone Encounter (Signed)
° °  Caller: Khadar/Patient; Patient Name: Ileene Rubens; PCP: Illene Regulus (Adults only); Best Callback Phone Number: [phone]; Reason for call:  He had sinus pain and saw Dr. Serena Croissant 05/17/12  and placed on Augmentin.  Symptoms started 2 weeks ago about 06/13/12.   Now jaw symptoms continuing, teeth are sensitive and feels like they are not mathching up correctly.  He has had  TMJ in the past and used a rubber spacer. He has tried exercises and applied heat and it gets better.  He is having pain innjaw with using his CPAP.   he has taken Tylenol, but still ahving pain.  What does he need to do to have this evaluated?  Triaged Jaw Symptoms and needs to be seen in 72 hours for persisitent pain more than one week that interferes with sleep AND not previously evaluated.  Home care and call back instructions given.  Appointment made with Dr. Debby Bud for 06/28/12 at 1100.

## 2012-06-28 ENCOUNTER — Encounter: Payer: Self-pay | Admitting: Internal Medicine

## 2012-06-28 ENCOUNTER — Ambulatory Visit (INDEPENDENT_AMBULATORY_CARE_PROVIDER_SITE_OTHER): Payer: Medicare Other | Admitting: Internal Medicine

## 2012-06-28 VITALS — BP 110/78 | HR 63 | Temp 97.5°F | Resp 16 | Wt 261.0 lb

## 2012-06-28 DIAGNOSIS — M542 Cervicalgia: Secondary | ICD-10-CM | POA: Diagnosis not present

## 2012-06-28 NOTE — Patient Instructions (Addendum)
Neck pain - on exam the TMJ right is normal! There is point tenderness to the posterior cervical muscles on the right. There is no appreciable enlarged lymph nodes at the angle of jaw.  Plan Lineament, heat to the cervical muscles right neck  See your dentist for evaluation for bruxism, clinching or malocclusion  Dr. Althea Grimmer - an orthodontist does work with patient with OSA and who use CPAP

## 2012-06-29 DIAGNOSIS — D485 Neoplasm of uncertain behavior of skin: Secondary | ICD-10-CM | POA: Diagnosis not present

## 2012-07-02 NOTE — Progress Notes (Signed)
  Subjective:    Patient ID: Guy Moore, male    DOB: [birth year], 78 y.o.   MRN: [id number]  HPI Guy Moore is c/o pain in the area of the right jaw: this includes the post-auricular area, the submandibular area. He has had no fever, chills, recent dental work   PMH, FamHx and SocHx reviewed for any changes and relevance.   Current Outpatient Prescriptions on File Prior to Visit  Medication Sig Dispense Refill  . dabigatran (PRADAXA) 150 MG CAPS Take 1 capsule (150 mg total) by mouth every 12 (twelve) hours.  180 capsule  3  . hydrocortisone (ANUSOL-HC) 25 MG suppository Place 25 mg rectally as needed.       Marland Kitchen MAGNESIUM GLUCONATE PO Take by mouth daily.        . Methylcellulose, Laxative, (CITRUCEL PO) Take by mouth as needed.        Marland Kitchen NEXIUM 40 MG capsule TAKE 1 CAPSULE BY MOUTH EVERY MORNING  90 capsule  1  . pravastatin (PRAVACHOL) 40 MG tablet TAKE 1 TABLET BY MOUTH AT BEDTIME  90 tablet  1  . valsartan (DIOVAN) 320 MG tablet Take 1 tablet (320 mg total) by mouth daily.  90 tablet  3  . KLOR-CON M20 20 MEQ tablet TAKE 1 TABLET BY MOUTH ONCE A DAY  90 tablet  1     Review of Systems System review is negative for any constitutional, cardiac, pulmonary, GI or neuro symptoms or complaints other than as described in the HPI.     Objective:   Physical Exam Filed Vitals:   06/28/12 1132  BP: 110/78  Pulse: 63  Temp: 97.5 F (36.4 C)  Resp: 16   Gen'l- overweight whote man in no distress HEENT-  No palpable crepitus over right TMJ; mild tenderness to pressure against the joint; full ROM. No palpable mass in the right submandibular region or the right anterior cervical chain. No lesion or tenderness in the right post-auricular region. Cor- RRR PUlm - normal respirations.       Assessment & Plan:  1. TMJ - symptoms are suggestive of TMJ despite the absence of crepitus. No enlarged lymph nodes or masses.  Plan Check with dentist about malocclusion or signs of  bruxism  Small bites  Consider use of mouth guard at night.  Consult oral surgeon or orthodontist re: bite, CPAP fit and muscle strain.

## 2012-07-08 ENCOUNTER — Telehealth: Payer: Self-pay | Admitting: Internal Medicine

## 2012-07-08 NOTE — Telephone Encounter (Signed)
Caller: Cote/Patient; Phone: [phone]; Reason for Call: Patient calling to see if Dr.  Myrtis Ser has spoke with Dr.  Debby Bud regarding medication.  States Dr.  Myrtis Ser was going to call Dr.  Debby Bud to see what the patient could take.  Please call patient back.  Thanks

## 2012-07-08 NOTE — Telephone Encounter (Signed)
Saw Guy Moore for TMJ pain - recommends treating inflammation.  Patient advised to use aspercreme. If no improvement will Rx voltaren gel

## 2012-07-11 ENCOUNTER — Telehealth: Payer: Self-pay | Admitting: Internal Medicine

## 2012-07-11 NOTE — Telephone Encounter (Signed)
Caller: Zimir/Patient; Patient Name: Guy Moore; PCP: Illene Regulus (Adults only); Best Callback Phone Number: [phone]. Call regarding pain to right jawline. Onset approximately 2 weeks. Patient has seen an orthodontist and told this is inflammation. TMJ has been ruled out. Called office on 07/08/12 and Dr. Debby Bud instructed patient to use Aspercreme. Instructed patient to call back on 07/11/12 and he would prescribe a topical ointment for the patient to use. Afebrile. Reports that the pain is severe at this time. Reports that he hears a clicking on the left side at times. Emergent symptom of "Worsening jaw pain that occurs with movement or increasing jaw immobility" positive per Jaw Symptoms guideline. Disposition: See provider within 72 hours. PLEASE CALL PATIENT BACK AND ADVISE IF A PRESCRIPTION CREAM CAN BE CALLED IN OR IF HE NEEDS TO COME IN FOR AN APPOINTMENT. Thanks.

## 2012-07-11 NOTE — Telephone Encounter (Signed)
Ok for voltaren gel 1-2 g over external aspect of TMJ/area of maximal tenderness qid. 2 x 100g tube, no refill.  Suggest second opinion from his dentist about the location of pain and the diagnosis by orthodontist of inflammation of TMJ.

## 2012-07-12 ENCOUNTER — Other Ambulatory Visit: Payer: Self-pay | Admitting: Internal Medicine

## 2012-07-12 ENCOUNTER — Telehealth: Payer: Self-pay | Admitting: Internal Medicine

## 2012-07-12 NOTE — Telephone Encounter (Signed)
Called patient. States was notified of voltaren gel called in to pharmacy earlier by another nurse. Called pharmacy to verify and pharmacist states had been called in already.

## 2012-07-12 NOTE — Telephone Encounter (Signed)
Caller: Guy Moore/Patient; Phone: [phone]; Reason for Call: Patient calling to see if Dr.  Debby Bud has call in another medication for his jaw.  He states the cream is not working and spoke with the nurse yesterday.  Please call him back.  Thanks

## 2012-07-12 NOTE — Telephone Encounter (Incomplete)
Caller: [name]/Patient; Patient Name: Guy Moore; PCP: Illene Regulus (Adults only); Best Callback Phone Number: [phone]; Reason for call: Seen in office last week for jaw pain and was told to use Aspercreme to area until Monday, Seen by Orthodontist and he agreed it was TMJ,he has been trying to get in touch with Dr Debby Bud  for stronger cream as told to him  by MD, he has not heard from office yet, patient upset he had to make multiple calls for this request, refuses need for triage,symptoms have not changed , aware jaw pain can be related to cardiac cause, Epic record checked and Voltaren cream OKed by MD 07/11/12 and not called in, Noted script called to [facility] [address] at [phone] per Epic{ after 17:00 for after hour call in} patient informed.

## 2012-07-22 DIAGNOSIS — Z23 Encounter for immunization: Secondary | ICD-10-CM | POA: Diagnosis not present

## 2012-08-18 ENCOUNTER — Telehealth: Payer: Self-pay | Admitting: Internal Medicine

## 2012-08-18 MED ORDER — TRAMADOL HCL 50 MG PO TABS: 50.0000 mg | ORAL_TABLET | Freq: Three times a day (TID) | ORAL | Status: DC | PRN

## 2012-08-18 NOTE — Telephone Encounter (Signed)
Caller: [name]/Patient; Patient Name: Guy Moore; PCP: Illene Regulus (Adults only); Best Callback Phone Number: [phone] Calling regarding his jaw pain, states Dr. Debby Bud is aware of this problem. States he was started on PCN 500 mg QID by his Dentist (Dr. Ralene Muskrat 08/16/12 for a cracked tooth and has infection, this is causing the bite problem etc. He needs to know from Dr. Debby Bud if ok  to be off Pradaxa for 4 days before the tooth extraction and up to 2 days after. States he has already discussed this with his Electrophysiologist Dr. Samule Ohm and he has advised this would be ok to be off and to have a regular Dentist to do this procedure, has not been scheduled yet, wants to make sure ok With Dr. Debby Bud to be off Pradaxa since he prescribed this and to make sure he is ok with his Dentist Dr. Domingo Dimes doing the surgery. Also wants to know if Dr. Debby Bud can call in something for pain for his jaw, Tylenol helps for about 3 hours and at times no relief and having a hard time sleeping due to the pain. States he came by the office on 08/16/12 and was told he could not speak with Dr. Debby Bud but a message would be sent to him. Afebrile. Emergent signs and symptoms ruled out as per Jaw Symptoms protocol except for see Dentist within 24 hours due to localized swelling of face and toothache. PLEASE CALL PATIENT AT NUMBER LISTED ABOVE REGARDING HIS CONCERNS.

## 2012-08-18 NOTE — Telephone Encounter (Signed)
Called in Rx tramadol

## 2012-08-18 NOTE — Telephone Encounter (Signed)
Ok to be off pradaxa

## 2012-08-18 NOTE — Telephone Encounter (Signed)
Pt informed of MD's advisement. Pt is also asking for MD's advisement regarding pain medication-pt is having breakthrough pain at night that is keeping him up at night and Tylenol is not working.

## 2012-08-31 DIAGNOSIS — I498 Other specified cardiac arrhythmias: Secondary | ICD-10-CM | POA: Diagnosis not present

## 2012-08-31 DIAGNOSIS — I4891 Unspecified atrial fibrillation: Secondary | ICD-10-CM | POA: Diagnosis not present

## 2012-09-27 ENCOUNTER — Ambulatory Visit (INDEPENDENT_AMBULATORY_CARE_PROVIDER_SITE_OTHER): Payer: Medicare Other | Admitting: Internal Medicine

## 2012-09-27 ENCOUNTER — Encounter: Payer: Self-pay | Admitting: Internal Medicine

## 2012-09-27 ENCOUNTER — Telehealth: Payer: Self-pay | Admitting: Internal Medicine

## 2012-09-27 VITALS — BP 132/82 | HR 84 | Temp 98.0°F | Resp 10 | Wt 258.0 lb

## 2012-09-27 DIAGNOSIS — I4891 Unspecified atrial fibrillation: Secondary | ICD-10-CM

## 2012-09-27 NOTE — Telephone Encounter (Signed)
Patient Information:  Caller Name: Haze  Phone: [phone]  Patient: Guy Moore, Guy Moore  Gender: Male  DOB: [birth year]  Age: 78 Years  PCP: Illene Regulus (Adults only)  Office Follow Up:  Does the office need to follow up with this patient?: No  Instructions For The Office: N/A   Symptoms  Reason For Call & Symptoms: Caller states, woke up with irregular heart beat,pulse 100,  BP 159/97, at times will be lightheaded or dizzy, no pain,  Reviewed Health History In EMR: Yes  Reviewed Medications In EMR: Yes  Reviewed Allergies In EMR: Yes  Reviewed Surgeries / Procedures: Yes  Date of Onset of Symptoms: 09/27/2012  Guideline(s) Used:  Heart Rate and Heartbeat Questions  Disposition Per Guideline:   See Today in Office  Reason For Disposition Reached:   Age > 60 years  Advice Given:  N/A  Appointment Scheduled:  09/27/2012 11:15:00 Appointment Scheduled Provider:  Illene Regulus (Adults only)

## 2012-09-28 NOTE — Assessment & Plan Note (Signed)
Patient presents for recurrent a. Fib. He can tell when he goes into atrial fib: most recently he had very rapid rate and mild light-headedness. At exam he is in Atrial Fib with controlled rate, confirmed by 12 lead EKG.  Plan Copy of EKG faxed to Dr. Tristan Schroeder at [facility]  Patient to continue Pradaxa  He will follow up with [facility] - may come to DC cardioversion.

## 2012-09-28 NOTE — Progress Notes (Signed)
Subjective:    Patient ID: Guy Moore, male    DOB: 1934-09-22, 79 y.o.   MRN: [id number]  HPI Guy Moore is followed by cardiology here and at [facility] for a history of a. Fib. He reports that in the last 24 hours he has felt his rhythm return to a. Fib initially with a very rapid rate. He did experience some mild light headness but no chest pain and no syncope. At the time of his visit he is feeling well.  Past Medical History  Diagnosis Date  . Atrial fibrillation   . Atrial flutter   . HTN (hypertension)   . Other and unspecified hyperlipidemia   . Type II or unspecified type diabetes mellitus without mention of complication, not stated as uncontrolled     Diet control   . Hyperglycemia   . BCC (basal cell carcinoma of skin)     Nose  . Knee problem     2% permanent partial impairment Right  . Gastric ulcer   . Hiatal hernia   . Allergic rhinitis   . Hearing loss     uses amplification  . Diverticulosis   . Erosive esophagitis   . Esophageal stricture   . Arthritis   . Depression   . Sleep apnea   . Hemorrhoids    Past Surgical History  Procedure Date  . Uvulopalatopharyngoplasty 1980's  . Vasectomy 1970's    w/complications  . Cardiac electrophysiology study and ablation     x 4   . Nose surgery    Family History  Problem Relation Age of Onset  . Stroke Mother   . Breast cancer Mother   . Heart disease Father   . Hypertension Father   . Cancer Father     Bladder  . Breast cancer Brother   . Atrial fibrillation Brother   . Tremor Brother   . Stroke Brother   . Colon cancer Neg Hx    History   Social History  . Marital Status: Married    Spouse Name: N/A    Number of Children: N/A  . Years of Education: N/A   Occupational History  . Retired at 25[employer][age]   . 27 years [employer]   . Med Engineer, manufacturing   . 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 Smoking status: Former Smoker -- 1.0 packs/day for 28 years   Types: Cigarettes    Quit date: 10/12/1977  . Smokeless tobacco: Never Used  . Alcohol Use: Yes     Comment: Occasional  . Drug Use: No  . Sexually Active: Not on file   Other Topics Concern  . Not on file   Social History Narrative   END OF LIFE CARE: he does not want CPR, short term mechanical ventilation. He does not want heroic or futile care especially if he cannon communicate, read and enjoy his loved ones.      Current Outpatient Prescriptions on File Prior to Visit  Medication Sig Dispense Refill  . dabigatran (PRADAXA) 150 MG CAPS Take 1 capsule (150 mg total) by mouth every 12 (twelve) hours.  180 capsule  3  . hydrocortisone (ANUSOL-HC) 25 MG suppository Place 25 mg rectally as needed.       Marland Kitchen KLOR-CON M20 20 MEQ tablet TAKE 1 TABLET BY MOUTH ONCE A DAY  90 tablet  3  . MAGNESIUM GLUCONATE PO Take by mouth daily.        . Methylcellulose,  Laxative, (CITRUCEL PO) Take by mouth as needed.        Marland Kitchen NEXIUM 40 MG capsule TAKE 1 CAPSULE BY MOUTH EVERY MORNING  90 capsule  1  . pravastatin (PRAVACHOL) 40 MG tablet TAKE 1 TABLET BY MOUTH AT BEDTIME  90 tablet  1  . valsartan (DIOVAN) 320 MG tablet Take 1 tablet (320 mg total) by mouth daily.  90 tablet  3  . traMADol (ULTRAM) 50 MG tablet Take 1 tablet (50 mg total) by mouth every 8 (eight) hours as needed for pain.  60 tablet  2      Review of Systems System review is negative for any constitutional, cardiac, pulmonary, GI or neuro symptoms or complaints other than as described in the HPI.     Objective:   Physical Exam Filed Vitals:   09/27/12 1124  BP: 132/82  Pulse: 84  Temp: 98 F (36.7 C)  Resp: 10   Gen'l - WNWD white man in no distress Cor - 2+ radial, IRIR Pulm - normal respirations  12 Lead EKG - a. Fib with controlled ventricular response       Assessment & Plan:

## 2012-11-26 ENCOUNTER — Other Ambulatory Visit: Payer: Self-pay

## 2012-12-29 ENCOUNTER — Other Ambulatory Visit: Payer: Self-pay | Admitting: Internal Medicine

## 2013-02-13 ENCOUNTER — Other Ambulatory Visit: Payer: Self-pay | Admitting: Internal Medicine

## 2013-02-22 DIAGNOSIS — I498 Other specified cardiac arrhythmias: Secondary | ICD-10-CM | POA: Diagnosis not present

## 2013-02-22 DIAGNOSIS — I4891 Unspecified atrial fibrillation: Secondary | ICD-10-CM | POA: Diagnosis not present

## 2013-02-22 DIAGNOSIS — Z7901 Long term (current) use of anticoagulants: Secondary | ICD-10-CM | POA: Diagnosis not present

## 2013-03-03 ENCOUNTER — Emergency Department (INDEPENDENT_AMBULATORY_CARE_PROVIDER_SITE_OTHER)
Admission: EM | Admit: 2013-03-03 | Discharge: 2013-03-03 | Disposition: A | Discharge status: Home / Self Care | Payer: Medicare Other | Source: Home / Self Care | Attending: Emergency Medicine | Admitting: Emergency Medicine

## 2013-03-03 ENCOUNTER — Telehealth: Payer: Self-pay | Admitting: Internal Medicine

## 2013-03-03 ENCOUNTER — Encounter (HOSPITAL_COMMUNITY): Payer: Self-pay | Admitting: Emergency Medicine

## 2013-03-03 DIAGNOSIS — W57XXXA Bitten or stung by nonvenomous insect and other nonvenomous arthropods, initial encounter: Secondary | ICD-10-CM

## 2013-03-03 DIAGNOSIS — T148 Other injury of unspecified body region: Secondary | ICD-10-CM

## 2013-03-03 MED ORDER — TRIAMCINOLONE ACETONIDE 0.1 % EX CREA: TOPICAL_CREAM | Freq: Three times a day (TID) | CUTANEOUS | Status: DC

## 2013-03-03 NOTE — Telephone Encounter (Signed)
He needs to be seen in an [facility] for this

## 2013-03-03 NOTE — ED Notes (Signed)
Pt c/o insect bite on right ankle x 3 days. Ankle is red and swollen. Has not taken anything. Patient is alert and oriented.

## 2013-03-03 NOTE — ED Provider Notes (Signed)
Chief Complaint:   Chief Complaint  Patient presents with  . Insect Bite    History of Present Illness:   Guy Moore is a 79 year old male who has had a two-day history of a small, raised, erythematous bump on his right anterior ankle. It itches but does not hurt. It is draining a small amount of pus. He did not see anything biting him. He denies any fever, chills, headache, stiff neck, or muscle pain. He denies any generalized rash.  Review of Systems:  Other than noted above, the patient denies any of the following symptoms: Systemic:  No fever, chills, sweats, weight loss, or fatigue. ENT:  No nasal congestion, rhinorrhea, sore throat, swelling of lips, tongue or throat. Resp:  No cough, wheezing, or shortness of breath. Skin:  No rash, itching, nodules, or suspicious lesions.  PMFSH:  Past medical history, family history, social history, meds, and allergies were reviewed. He has no known allergies. He takes Xarelto, amlodipine, Pravachol, Nexium, Diovan, and potassium chloride. He has atrial fibrillation and is status post for ablation, hypertension, and hypercholesterolemia.  Physical Exam:   Vital signs:  BP 149/73  Pulse 56  Temp(Src) 98 F (36.7 C) (Oral)  Resp 16  SpO2 99% Gen:  Alert, oriented, in no distress. ENT:  Pharynx clear, no intraoral lesions, moist mucous membranes. Lungs:  Clear to auscultation. Skin:  There is a tiny erythematous bump on his right anterior ankle, just at the joint line. This was nontender to palpation. There is some overlying scaling. There is no purulent drainage. Skin is otherwise clear.  Assessment:  The encounter diagnosis was Insect bite.  This could be a spider bite, tick bite, or other insect bite. There is no evidence for infection.  Plan:   1.  The following meds were prescribed:   Discharge Medication List as of 03/03/2013  3:49 PM    START taking these medications   Details  triamcinolone cream (KENALOG) 0.1 % Apply topically  3 (three) times daily., Starting 03/03/2013, Until Discontinued, Normal       2.  The patient was instructed in symptomatic care and handouts were given. 3.  The patient was told to return if becoming worse in any way, if no better in 3 or 4 days, and given some red flag symptoms such as fever, chills, headache, muscle aches, or generalized rash that would indicate earlier return. 4.  Follow up here or with Dr. Debby Bud as needed.     Reuben Likes, MD 03/03/13 1710

## 2013-03-03 NOTE — Telephone Encounter (Signed)
Patient Information:  Caller Name: [name]  Phone: [phone]  Patient: Guy Moore, Guy Moore  Gender: Male  DOB: Dec 01, 1933  Age: 79 Years  PCP: Illene Regulus (Adults only)  Office Follow Up:  Does the office need to follow up with this patient?: Yes  Instructions For The Office: PLS CALL PT BACK FOR APPT NEED, NO SAME DAY REMAINING  RN Note:  Pt started new anti-coagulant, Xareloto, by Cardilogist, 1 week ago. Pt noticed some brusing w/ a pus pocket on Right Ankle w/ redness spreading, not painful.  Pt is concerned of spider bite. No same day appts remaining.  PLEASE CALL PT BACK IF APPT CAN BE FIT IN, SAME DAY, PT CONCERNED OF POSSIBLE SPIDER BITE.   Symptoms  Reason For Call & Symptoms: Insect Bite, Pus discharge w / Skin bruising  Reviewed Health History In EMR: Yes  Reviewed Medications In EMR: Yes  Reviewed Allergies In EMR: Yes  Reviewed Surgeries / Procedures: Yes  Date of Onset of Symptoms: 03/03/2013  Guideline(s) Used:  Insect Bite  Spider Bite  Disposition Per Guideline:   See Today in Office  Reason For Disposition Reached:   Patient wants to be seen  Advice Given:  N/A  Patient Will Follow Care Advice:  YES

## 2013-03-07 NOTE — Telephone Encounter (Signed)
Pt seen in ER 05/23

## 2013-03-14 ENCOUNTER — Ambulatory Visit (INDEPENDENT_AMBULATORY_CARE_PROVIDER_SITE_OTHER): Payer: Medicare Other | Admitting: Internal Medicine

## 2013-03-14 ENCOUNTER — Other Ambulatory Visit (INDEPENDENT_AMBULATORY_CARE_PROVIDER_SITE_OTHER): Payer: Medicare Other

## 2013-03-14 ENCOUNTER — Encounter: Payer: Self-pay | Admitting: Internal Medicine

## 2013-03-14 VITALS — BP 148/80 | HR 55 | Temp 97.2°F | Wt 255.0 lb

## 2013-03-14 DIAGNOSIS — Z23 Encounter for immunization: Secondary | ICD-10-CM

## 2013-03-14 DIAGNOSIS — E119 Type 2 diabetes mellitus without complications: Secondary | ICD-10-CM

## 2013-03-14 DIAGNOSIS — E785 Hyperlipidemia, unspecified: Secondary | ICD-10-CM | POA: Diagnosis not present

## 2013-03-14 DIAGNOSIS — I10 Essential (primary) hypertension: Secondary | ICD-10-CM | POA: Diagnosis not present

## 2013-03-14 DIAGNOSIS — Z0000 Encounter for general adult medical examination without abnormal findings: Secondary | ICD-10-CM

## 2013-03-14 DIAGNOSIS — I4891 Unspecified atrial fibrillation: Secondary | ICD-10-CM

## 2013-03-14 LAB — HEPATIC FUNCTION PANEL
ALT: 27 U/L (ref 0–53)
AST: 25 U/L (ref 0–37)
Bilirubin, Direct: 0.1 mg/dL (ref 0.0–0.3)
Total Bilirubin: 0.7 mg/dL (ref 0.3–1.2)
Total Protein: 7 g/dL (ref 6.0–8.3)

## 2013-03-14 LAB — COMPREHENSIVE METABOLIC PANEL
BUN: 16 mg/dL (ref 6–23)
CO2: 30 mEq/L (ref 19–32)
Calcium: 9 mg/dL (ref 8.4–10.5)
Chloride: 104 mEq/L (ref 96–112)
Creatinine, Ser: 0.9 mg/dL (ref 0.4–1.5)
GFR: 89.18 mL/min (ref >60.00)
Total Bilirubin: 0.7 mg/dL (ref 0.3–1.2)

## 2013-03-14 LAB — LIPID PANEL
Cholesterol: 122 mg/dL (ref 0–200)
HDL: 51.3 mg/dL (ref >39.00)
VLDL: 4.2 mg/dL (ref 0.0–40.0)

## 2013-03-14 LAB — HEMOGLOBIN A1C: Hgb A1c MFr Bld: 6.5 % (ref 4.6–6.5)

## 2013-03-14 NOTE — Assessment & Plan Note (Signed)
BP Readings from Last 3 Encounters:  03/14/13 148/80  03/03/13 149/73  09/27/12 132/82   Within recommended limits per JNC 8 but will need to discuss adding diuretic forbetter control systolic pressure.

## 2013-03-14 NOTE — Assessment & Plan Note (Signed)
Holding sinus rhythm by EKG. He did well at [facility] and is to return there in 1 year.   Plan  Continue present medical regimen

## 2013-03-14 NOTE — Patient Instructions (Addendum)
Thanks for coming to see me. Have a great trip to [place] and [place] - home of great scotch and good beer.  Everything looks good today. Will check routine lab and results will be available on MyChart.  All immunizations are up to date except for record of pneumonia vaccine - will give today.   Full note to follow.

## 2013-03-14 NOTE — Assessment & Plan Note (Signed)
A1C 6.5% today close to previous readings  Plan - continue life-style management

## 2013-03-14 NOTE — Progress Notes (Signed)
Subjective:    Patient ID: Guy Moore, male    DOB: [birth year], 79 y.o.   MRN: [id number]  HPI Guy Moore is here for annual Medicare wellness examination and management of other chronic and acute problems.   In the interval he has continued to follow at [facility]. He reports he was changed to Xarelto which he is tolerating well. Amlodipine was added to his regimen for better blood pressure control.  He was deemed stable with return to [facility] in 1 year.   He reports he is doing fine. He was seen at Urgent Care approx 1 week ago for a lesion on the right anterior ankle: thought it was a bite of some sort. Possible spider bite vs tick. Treated with Kenalog cream with good results.   He has developed mild DIP joint change, swan neck, 5th digit right along with a small synovial cyst.   Reviewed medication administration: ok to take all meds together at the same time every day.   The risk factors are reflected in the social history.  The roster of all physicians providing medical care to patient - is listed in the Snapshot section of the chart.  Activities of daily living:  The patient is 100% inedpendent in all ADLs: dressing, toileting, feeding as well as independent mobility  Home safety : The patient has smoke detectors in the home. Falls- no falls. Home is fall safe  They wear seatbelts. No firearms at home. There is no violence in the home.   There is no risks for hepatitis, STDs or HIV. There is no   history of blood transfusion. They have no travel history to infectious disease endemic areas of the world.  The patient has seen their dentist in the last six month. They have seen their eye doctor in the last year. They admit to hearing difficulty and have had audiologic testing in the last year.  He does use hearing aids. They do not  have excessive sun exposure. Discussed the need for sun protection: hats, long sleeves and use of sunscreen if there is significant sun exposure.    Diet: the importance of a healthy diet is discussed. They do have a healthy diet.  The patient has no regular exercise program.  The benefits of regular aerobic exercise were discussed.  Depression screen: there are no signs or vegative symptoms of depression- irritability, change in appetite, anhedonia, sadness/tearfullness.  Cognitive assessment: the patient manages all their financial and personal affairs and is actively engaged.   The following portions of the patient's history were reviewed and updated as appropriate: allergies, current medications, past family history, past medical history,  past surgical history, past social history  and problem list.  Vision, hearing, body mass index were assessed and reviewed.   During the course of the visit the patient was educated and counseled about appropriate screening and preventive services including : fall prevention , diabetes screening, nutrition counseling, colorectal cancer screening, and recommended immunizations.  Past Medical History  Diagnosis Date  . Atrial fibrillation   . Atrial flutter   . HTN (hypertension)   . Other and unspecified hyperlipidemia   . Type II or unspecified type diabetes mellitus without mention of complication, not stated as uncontrolled     Diet control   . Hyperglycemia   . BCC (basal cell carcinoma of skin)     Nose  . Knee problem     2% permanent partial impairment Right  . Gastric ulcer   .  Hiatal hernia   . Allergic rhinitis   . Hearing loss     uses amplification  . Diverticulosis   . Erosive esophagitis   . Esophageal stricture   . Arthritis   . Depression   . Sleep apnea   . Hemorrhoids    Past Surgical History  Procedure Laterality Date  . Uvulopalatopharyngoplasty  1980's  . Vasectomy  1970's    w/complications  . Cardiac electrophysiology study and ablation      x 4   . Nose surgery     Family History  Problem Relation Age of Onset  . Stroke Mother   . Breast cancer  Mother   . Heart disease Father   . Hypertension Father   . Cancer Father     Bladder  . Breast cancer Brother   . Atrial fibrillation Brother   . Tremor Brother   . Stroke Brother   . Colon cancer Neg Hx    History   Social History  . Marital Status: Married    Spouse Name: N/A    Number of Children: N/A  . Years of Education: N/A   Occupational History  . Retired at 74[employer]   . 27 years [employer]   . Med Engineer, manufacturing   . 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 Smoking status: Former Smoker -- 1.00 packs/day for 28 years    Types: Cigarettes    Quit date: 10/12/1977  . Smokeless tobacco: Never Used  . Alcohol Use: Yes     Comment: Occasional  . Drug Use: No  . Sexually Active: Not on file   Other Topics Concern  . Not on file   Social History Narrative   END OF LIFE CARE: he does not want CPR, short term mechanical ventilation. He does not want heroic or futile care especially if he cannon communicate, read and enjoy his loved ones.     Current Outpatient Prescriptions on File Prior to Visit  Medication Sig Dispense Refill  . hydrocortisone (ANUSOL-HC) 25 MG suppository Place 25 mg rectally as needed.       Marland Kitchen KLOR-CON M20 20 MEQ tablet TAKE 1 TABLET BY MOUTH ONCE A DAY  90 tablet  3  . MAGNESIUM GLUCONATE PO Take by mouth daily.        . Methylcellulose, Laxative, (CITRUCEL PO) Take by mouth as needed.        Marland Kitchen NEXIUM 40 MG capsule TAKE 1 CAPSULE BY MOUTH EVERY MORNING  90 capsule  2  . pravastatin (PRAVACHOL) 40 MG tablet TAKE 1 TABLET BY MOUTH AT BEDTIME  90 tablet  2  . traMADol (ULTRAM) 50 MG tablet Take 1 tablet (50 mg total) by mouth every 8 (eight) hours as needed for pain.  60 tablet  2  . triamcinolone cream (KENALOG) 0.1 % Apply topically 3 (three) times daily.  30 g  0  . valsartan (DIOVAN) 320 MG tablet Take 1 tablet (320 mg total) by mouth daily.  90 tablet  3   No current facility-administered medications on  file prior to visit.    Review of Systems Constitutional:  Negative for fever, chills, activity change and unexpected weight change.  HEENT:  Negative for hearing loss, ear pain, congestion, neck stiffness and postnasal drip. Negative for sore throat or swallowing problems. Negative for dental complaints.   Eyes: Negative for vision loss or change in visual acuity.  Respiratory: Negative for chest tightness and wheezing.  Negative for DOE.   Cardiovascular: Negative for chest pain or palpitations. No decreased exercise tolerance Gastrointestinal: No change in bowel habit. No bloating or gas. No reflux or indigestion Genitourinary: Negative for urgency, frequency, flank pain and difficulty urinating.  Musculoskeletal: Negative for myalgias, back pain, arthralgias and gait problem.  Neurological: Negative for dizziness, tremors, weakness and headaches.  Hematological: Negative for adenopathy.  Psychiatric/Behavioral: Negative for behavioral problems and dysphoric mood.       Objective:   Physical Exam Filed Vitals:   03/14/13 0936  BP: 148/80  Pulse: 55  Temp: 97.2 F (36.2 C)   Wt Readings from Last 3 Encounters:  03/14/13 255 lb (115.667 kg)  09/27/12 258 lb (117.028 kg)  06/28/12 261 lb (118.389 kg)   Gen'l: Well nourished well developed white male in no acute distress  HEENT: Head: Normocephalic and atraumatic. External ears normal. Hearing aids in place bilaterally Nose: Nose normal. Mouth/Throat: Oropharynx is clear and moist. Dentition - native, in good repair. No buccal or palatal lesions. Posterior pharynx clear. Eyes: Conjunctivae and sclera clear. EOM intact. Pupils are equal, round, and reactive to light. Right eye exhibits no discharge. Left eye exhibits no discharge. Neck: Normal range of motion. Neck supple. No JVD present. No tracheal deviation present. No thyromegaly present.  Cardiovascular: Normal rate, regular rhythm, no gallop, no friction rub, no murmur heard.       Quiet precordium. 2+ radial and DP pulses . No carotid bruits Pulmonary/Chest: Effort normal. No respiratory distress or increased WOB, no wheezes, no rales. No chest wall deformity or CVAT. Abdomen: Soft. Bowel sounds are normal in all quadrants. He exhibits no distension, no tenderness, no rebound or guarding, No heptosplenomegaly  Genitourinary:  deferred Musculoskeletal: Normal range of motion. He exhibits no edema and no tenderness.       Small and large joints without redness, synovial thickening or deformity. Full range of motion preserved about all small, median and large joints. Mild swan neck deformity at DIP 4th-5th digits right; small synovial cyst right 5th DID joint. Lymphadenopathy:    He has no cervical or supraclavicular adenopathy.  Neurological: He is alert and oriented to person, place, and time. CN II-XII intact. DTRs 2+ and symmetrical biceps, radial and patellar tendons. Cerebellar function normal with no tremor, rigidity, normal gait and station.  Skin: Skin is warm and dry. No rash noted. No erythema.  Psychiatric: He has a normal mood and affect. His behavior is normal. Thought content normal.   Recent Results (from the past 2160 hour(s))  LIPID PANEL     Status: None   Collection Time    03/14/13 11:07 AM      Result Value Range   Cholesterol 122  0 - 200 mg/dL   Comment: ATP III Classification       Desirable:  < 200 mg/dL               Borderline High:  200 - 239 mg/dL          High:  > = 621 mg/dL   Triglycerides 30.8  0.0 - 149.0 mg/dL   Comment: Normal:  <657 mg/dLBorderline High:  150 - 199 mg/dL   HDL 84.69  >62.95 mg/dL   VLDL 4.2  0.0 - 28.4 mg/dL   LDL Cholesterol 67  0 - 99 mg/dL   Total CHOL/HDL Ratio 2     Comment:                Men  Women1/2 Average Risk     3.4          3.3Average Risk          5.0          4.42X Average Risk          9.6          7.13X Average Risk          15.0          11.0                      HEPATIC FUNCTION PANEL      Status: Abnormal   Collection Time    03/14/13 11:07 AM      Result Value Range   Total Bilirubin 0.7  0.3 - 1.2 mg/dL   Bilirubin, Direct 0.1  0.0 - 0.3 mg/dL   Alkaline Phosphatase 38 (*) 39 - 117 U/L   AST 25  0 - 37 U/L   ALT 27  0 - 53 U/L   Total Protein 7.0  6.0 - 8.3 g/dL   Albumin 3.9  3.5 - 5.2 g/dL  TSH     Status: None   Collection Time    03/14/13 11:07 AM      Result Value Range   TSH 1.10  0.35 - 5.50 uIU/mL  HEMOGLOBIN A1C     Status: None   Collection Time    03/14/13 11:07 AM      Result Value Range   Hemoglobin A1C 6.5  4.6 - 6.5 %   Comment: Glycemic Control Guidelines for People with Diabetes:Non Diabetic:  <6%Goal of Therapy: <7%Additional Action Suggested:  >8%   COMPREHENSIVE METABOLIC PANEL     Status: Abnormal   Collection Time    03/14/13 11:07 AM      Result Value Range   Sodium 140  135 - 145 mEq/L   Potassium 4.2  3.5 - 5.1 mEq/L   Chloride 104  96 - 112 mEq/L   CO2 30  19 - 32 mEq/L   Glucose, Bld 111 (*) 70 - 99 mg/dL   BUN 16  6 - 23 mg/dL   Creatinine, Ser 0.9  0.4 - 1.5 mg/dL   Total Bilirubin 0.7  0.3 - 1.2 mg/dL   Alkaline Phosphatase 38 (*) 39 - 117 U/L   AST 25  0 - 37 U/L   ALT 27  0 - 53 U/L   Total Protein 7.0  6.0 - 8.3 g/dL   Albumin 3.9  3.5 - 5.2 g/dL   Calcium 9.0  8.4 - 16.1 mg/dL   GFR 09.60  >45.40 mL/min          Assessment & Plan:

## 2013-03-14 NOTE — Assessment & Plan Note (Signed)
Interval history is benign with a good report from [facility] in regard to A. Fib and previous ablations. Physical exam is normal. Labs are in normal range. He is current with colorectal cancer screening and has aged out re: prostate screening. Immunization are up to date with pneumonia vaccine today.  In summary - a nice man who appears to be medically stable at this time.

## 2013-03-14 NOTE — Assessment & Plan Note (Signed)
LDL and HDL at goal. Liver functions normal.  Plan  Continue present treatment

## 2013-03-16 ENCOUNTER — Telehealth: Payer: Self-pay

## 2013-03-16 NOTE — Telephone Encounter (Signed)
Labs from 03/14/13 have been faxed to [facility], attention Cleatrice Burke RN, BSN, CSN III [phone]. Phone # is [phone]. This was done per patient request.

## 2013-04-15 ENCOUNTER — Encounter: Payer: Self-pay | Admitting: Family Medicine

## 2013-04-15 ENCOUNTER — Ambulatory Visit (INDEPENDENT_AMBULATORY_CARE_PROVIDER_SITE_OTHER): Payer: Medicare Other | Admitting: Family Medicine

## 2013-04-15 VITALS — BP 118/78 | HR 58 | Temp 97.9°F | Wt 261.0 lb

## 2013-04-15 DIAGNOSIS — K055 Other periodontal diseases: Secondary | ICD-10-CM

## 2013-04-15 DIAGNOSIS — K068 Other specified disorders of gingiva and edentulous alveolar ridge: Secondary | ICD-10-CM | POA: Insufficient documentation

## 2013-04-15 NOTE — Assessment & Plan Note (Signed)
On xaralto--- hold xaralto for at least 3-4 days.  Call cardio at [facility].  Pt has been off of it before for dental work. If bleeding does not completely stop by tonight --- go to ER F/u pcp next week

## 2013-04-15 NOTE — Progress Notes (Signed)
  Subjective:    Patient ID: Guy Moore, male    DOB: 1933-12-21, 79 y.o.   MRN: [id number]  HPI Pt here with his wife c/o gums bleeding and he is on xaralto.  He usually takes it at night.  They called the dentist and they were told to use pressure.  That was this am and the gums are still bleeding.   Review of Systems As above    Objective:   Physical Exam  BP 118/78  Pulse 58  Temp(Src) 97.9 F (36.6 C) (Oral)  Wt 261 lb (118.389 kg)  BMI 37.45 kg/m2 General appearance: alert, cooperative, appears stated age and no distress Throat: abnormal findings: bleedding gums ---back upper tooth on R side---- just slightly oozing now Neck: no adenopathy, supple, symmetrical, trachea midline and thyroid not enlarged, symmetric, no tenderness/mass/nodules Lungs: clear to auscultation bilaterally Heart: S1, S2 normal      Assessment & Plan:

## 2013-05-02 ENCOUNTER — Other Ambulatory Visit: Payer: Self-pay | Admitting: Internal Medicine

## 2013-05-18 ENCOUNTER — Ambulatory Visit: Payer: Medicare Other | Admitting: Pulmonary Disease

## 2013-06-06 ENCOUNTER — Other Ambulatory Visit: Payer: Self-pay | Admitting: Internal Medicine

## 2013-06-16 ENCOUNTER — Ambulatory Visit (INDEPENDENT_AMBULATORY_CARE_PROVIDER_SITE_OTHER): Payer: Medicare Other | Admitting: Pulmonary Disease

## 2013-06-16 ENCOUNTER — Encounter: Payer: Self-pay | Admitting: Pulmonary Disease

## 2013-06-16 VITALS — BP 150/76 | HR 61 | Temp 98.4°F | Ht 70.0 in | Wt 259.0 lb

## 2013-06-16 DIAGNOSIS — G4733 Obstructive sleep apnea (adult) (pediatric): Secondary | ICD-10-CM | POA: Diagnosis not present

## 2013-06-16 NOTE — Patient Instructions (Addendum)
Stay on cpap, and keep up with supplies If you continue to have mask sealing issues despite having new cushions, would consider changing to a different mask Work on weight loss followup with me in one year.

## 2013-06-16 NOTE — Assessment & Plan Note (Signed)
The patient appears to be doing well from a sleep apnea standpoint.  He is wearing CPAP compliantly, and feels very rested during the day.  I have encouraged him to work more aggressively on weight loss, and to keep up with his CPAP supplies.  He will see me back in one year.

## 2013-06-16 NOTE — Progress Notes (Signed)
  Subjective:    Patient ID: Guy Moore, male    DOB: 1933-10-20, 79 y.o.   MRN: [id number]  HPI Patient comes in today for followup of his obstructive sleep apnea.  He is wearing CPAP compliantly, and feels that he sleeps well with excellent daytime alertness.  His only complaint is that he is having to pull his mask a little tighter to prevent it from leaking.  He thinks that he is keeping up with his seals on a regular basis.   Review of Systems  Constitutional: Negative for fever and unexpected weight change.  HENT: Negative for ear pain, nosebleeds, congestion, sore throat, rhinorrhea, sneezing, trouble swallowing, dental problem, postnasal drip and sinus pressure.   Eyes: Negative for redness and itching.  Respiratory: Negative for cough, chest tightness, shortness of breath and wheezing.   Cardiovascular: Negative for palpitations and leg swelling.  Gastrointestinal: Negative for nausea and vomiting.  Genitourinary: Negative for dysuria.  Musculoskeletal: Negative for joint swelling.  Skin: Negative for rash.  Neurological: Negative for headaches.  Hematological: Does not bruise/bleed easily.  Psychiatric/Behavioral: Negative for dysphoric mood. The patient is not nervous/anxious.        Objective:   Physical Exam Overweight male in no acute distress Nose without purulence or discharge noted No skin breakdown or pressure necrosis from the CPAP mask Lower extremities without edema, no cyanosis Alert and oriented, does not appear to be sleepy, moves all 4 extremities.         Assessment & Plan:

## 2013-06-21 ENCOUNTER — Encounter: Payer: Self-pay | Admitting: Internal Medicine

## 2013-06-21 ENCOUNTER — Ambulatory Visit (INDEPENDENT_AMBULATORY_CARE_PROVIDER_SITE_OTHER): Payer: Medicare Other | Admitting: Internal Medicine

## 2013-06-21 VITALS — BP 142/80 | HR 80 | Temp 98.1°F | Wt 255.0 lb

## 2013-06-21 DIAGNOSIS — I4891 Unspecified atrial fibrillation: Secondary | ICD-10-CM | POA: Diagnosis not present

## 2013-06-21 DIAGNOSIS — J069 Acute upper respiratory infection, unspecified: Secondary | ICD-10-CM

## 2013-06-21 MED ORDER — AZITHROMYCIN 250 MG PO TABS: ORAL_TABLET | ORAL | Status: DC

## 2013-06-21 MED ORDER — PROMETHAZINE-CODEINE 6.25-10 MG/5ML PO SYRP: 5.0000 mL | ORAL_SOLUTION | ORAL | Status: DC | PRN

## 2013-06-21 NOTE — Progress Notes (Signed)
Subjective:    Patient ID: Guy Moore, male    DOB: 10/28/1933, 79 y.o.   MRN: [id number]  HPI Sunday morning he had tachycardia - 90+ and irregular. The rate did slow down but he has had a lot of ectopy and question of a. Fib. He has been hemodynamcially stable.  He has a cold with a barking cough. No fever or chills.  Past Medical History  Diagnosis Date  . Atrial fibrillation   . Atrial flutter   . HTN (hypertension)   . Other and unspecified hyperlipidemia   . Type II or unspecified type diabetes mellitus without mention of complication, not stated as uncontrolled     Diet control   . Hyperglycemia   . BCC (basal cell carcinoma of skin)     Nose  . Knee problem     2% permanent partial impairment Right  . Gastric ulcer   . Hiatal hernia   . Allergic rhinitis   . Hearing loss     uses amplification  . Diverticulosis   . Erosive esophagitis   . Esophageal stricture   . Arthritis   . Depression   . Sleep apnea   . Hemorrhoids    Past Surgical History  Procedure Laterality Date  . Uvulopalatopharyngoplasty  1980's  . Vasectomy  1970's    w/complications  . Cardiac electrophysiology study and ablation      x 4   . Nose surgery     Family History  Problem Relation Age of Onset  . Stroke Mother   . Breast cancer Mother   . Heart disease Father   . Hypertension Father   . Cancer Father     Bladder  . Breast cancer Brother   . Atrial fibrillation Brother   . Tremor Brother   . Stroke Brother   . Colon cancer Neg Hx    History   Social History  . Marital Status: Married    Spouse Name: N/A    Number of Children: N/A  . Years of Education: N/A   Occupational History  . Retired at [employer]   . 27 years [employer]   . Med Engineer, manufacturing   . 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 Smoking status: Former Smoker -- 1.00 packs/day for 28 years    Types: Cigarettes    Quit date: 10/12/1977  . Smokeless tobacco:  Never Used  . Alcohol Use: Yes     Comment: Occasional  . Drug Use: No  . Sexual Activity: Not on file   Other Topics Concern  . Not on file   Social History Narrative   END OF LIFE CARE: he does not want CPR, short term mechanical ventilation. He does not want heroic or futile care especially if he cannon communicate, read and enjoy his loved ones.      Current Outpatient Prescriptions on File Prior to Visit  Medication Sig Dispense Refill  . amLODipine (NORVASC) 5 MG tablet Take 5 mg by mouth daily.      Marland Kitchen DIOVAN 320 MG tablet TAKE 1 TABLET BY MOUTH DAILY  90 tablet  1  . hydrocortisone (ANUSOL-HC) 25 MG suppository Place 25 mg rectally as needed.       Marland Kitchen MAGNESIUM GLUCONATE PO Take by mouth daily.        . Methylcellulose, Laxative, (CITRUCEL PO) Take by mouth as needed.        Marland Kitchen NEXIUM 40 MG capsule TAKE  1 CAPSULE BY MOUTH EVERY MORNING  90 capsule  2  . potassium chloride SA (K-DUR,KLOR-CON) 20 MEQ tablet TAKE 1 TABLET DAILY  90 tablet  2  . pravastatin (PRAVACHOL) 40 MG tablet TAKE 1 TABLET BY MOUTH AT BEDTIME  90 tablet  2  . Rivaroxaban (XARELTO) 20 MG TABS Take 20 mg by mouth daily.      . traMADol (ULTRAM) 50 MG tablet Take 1 tablet (50 mg total) by mouth every 8 (eight) hours as needed for pain.  60 tablet  2  . triamcinolone cream (KENALOG) 0.1 % Apply topically 3 (three) times daily.  30 g  0   No current facility-administered medications on file prior to visit.      Review of Systems System review is negative for any constitutional, cardiac, pulmonary, GI or neuro symptoms or complaints other than as described in the HPI.     Objective:   Physical Exam Filed Vitals:   06/21/13 1041  BP: 142/80  Pulse: 80  Temp: 98.1 F (36.7 C)   Wt Readings from Last 3 Encounters:  06/21/13 255 lb (115.667 kg)  06/16/13 259 lb (117.482 kg)  04/15/13 261 lb (118.389 kg)   gen'l- elderly white man in no distress HEENT - C&S clear Cor- 2+ radial pulse - regular with  PVCs vs a. Fib rate controlled. Ausculatation- sinus with PVCs vs A. Fib rate controlled Pulm - good breath sounds, lungs CTAP   12 lead - a. Fib at 80 with ectopy     Assessment & Plan:  Upper respiratory infection - no signs of pneumonia Plan - z-pak  Phenergan w/ codeine 1 tsp q 4 prn.  Vitamin C  hydrate

## 2013-06-21 NOTE — Patient Instructions (Addendum)
1. A. Fib - by EKG back in a. Fib with a controlled rate along with ectopic beats. You are hemodynamically stable: BP not low, no chest pain, no shortness of breath. Plan Continue present medications  Communicate with your doctor(s) at [facility].  2. Upper respiratory infection - no signs of pneumonia Plan - z-pak  Phenergan w/ codeine 1 tsp q 4 prn.  Vitamin C  hydrate

## 2013-06-23 NOTE — Assessment & Plan Note (Signed)
Patient presents with irregular heart rate that was rapid and symptomatic. He is in atrial fibrillation today with good rate control but a lot of ectopy. He is hemodynamically stable.  Plan He will contact Dr. Tristan Schroeder at [facility] who can access EKG via Care Everywhere

## 2013-08-04 DIAGNOSIS — H353190 Nonexudative age-related macular degeneration, unspecified eye, stage unspecified: Secondary | ICD-10-CM | POA: Diagnosis not present

## 2013-09-15 ENCOUNTER — Other Ambulatory Visit: Payer: Self-pay | Admitting: *Deleted

## 2013-09-15 MED ORDER — RIVAROXABAN 20 MG PO TABS: 20.0000 mg | ORAL_TABLET | Freq: Every day | ORAL | Status: DC

## 2013-09-28 ENCOUNTER — Other Ambulatory Visit: Payer: Self-pay | Admitting: Internal Medicine

## 2013-09-29 ENCOUNTER — Telehealth: Payer: Self-pay

## 2013-09-29 MED ORDER — AMLODIPINE BESYLATE 5 MG PO TABS: 5.0000 mg | ORAL_TABLET | Freq: Every day | ORAL | Status: DC

## 2013-09-29 NOTE — Telephone Encounter (Signed)
The patient called and is hoping to get his rx for Norvasc 5mg  (generic) [facility] to [facility] (he states he is cheaper this way)   Callback - [phone]

## 2013-09-29 NOTE — Telephone Encounter (Signed)
Patient aware prescription has been sent to [facility].

## 2013-10-27 ENCOUNTER — Other Ambulatory Visit: Payer: Self-pay | Admitting: Internal Medicine

## 2014-01-08 ENCOUNTER — Encounter: Payer: Self-pay | Admitting: Internal Medicine

## 2014-01-08 ENCOUNTER — Ambulatory Visit (INDEPENDENT_AMBULATORY_CARE_PROVIDER_SITE_OTHER): Payer: Medicare Other | Admitting: Internal Medicine

## 2014-01-08 VITALS — BP 150/76 | HR 80 | Temp 99.3°F | Resp 16 | Wt 260.0 lb

## 2014-01-08 DIAGNOSIS — J209 Acute bronchitis, unspecified: Secondary | ICD-10-CM | POA: Diagnosis not present

## 2014-01-08 DIAGNOSIS — J309 Allergic rhinitis, unspecified: Secondary | ICD-10-CM

## 2014-01-08 DIAGNOSIS — I10 Essential (primary) hypertension: Secondary | ICD-10-CM | POA: Diagnosis not present

## 2014-01-08 MED ORDER — PROMETHAZINE-CODEINE 6.25-10 MG/5ML PO SYRP: 5.0000 mL | ORAL_SOLUTION | ORAL | Status: DC | PRN

## 2014-01-08 MED ORDER — AMOXICILLIN-POT CLAVULANATE 875-125 MG PO TABS: 1.0000 | ORAL_TABLET | Freq: Two times a day (BID) | ORAL | Status: DC

## 2014-01-08 NOTE — Progress Notes (Signed)
Pre visit review using our clinic review tool, if applicable. No additional management support is needed unless otherwise documented below in the visit note. 

## 2014-01-08 NOTE — Assessment & Plan Note (Signed)
Continue with prn Claritin

## 2014-01-08 NOTE — Assessment & Plan Note (Signed)
Continue with current prescription therapy as reflected on the Med list.  

## 2014-01-08 NOTE — Progress Notes (Signed)
   Subjective:    Patient ID: Guy Moore, male    DOB: April 15, 1934, 80 y.o.   MRN: [id number]  URI  This is a new problem. The current episode started in the past 7 days. The problem has been gradually worsening. The maximum temperature recorded prior to his arrival was 100 - 100.9 F. Associated symptoms include coughing, rhinorrhea, sinus pain, a sore throat and wheezing. Pertinent negatives include no chest pain, congestion, diarrhea, nausea, neck pain, rash or sneezing. Associated symptoms comments: Green mucus. He has tried acetaminophen for the symptoms. The treatment provided no relief.      Review of Systems  Constitutional: Negative for appetite change, fatigue and unexpected weight change.  HENT: Positive for rhinorrhea and sore throat. Negative for congestion, nosebleeds, sneezing and trouble swallowing.   Eyes: Negative for itching and visual disturbance.  Respiratory: Positive for cough and wheezing.   Cardiovascular: Negative for chest pain, palpitations and leg swelling.  Gastrointestinal: Negative for nausea, diarrhea, blood in stool and abdominal distention.  Genitourinary: Negative for frequency and hematuria.  Musculoskeletal: Negative for back pain, gait problem, joint swelling and neck pain.  Skin: Negative for rash.  Neurological: Negative for dizziness, tremors, speech difficulty and weakness.  Psychiatric/Behavioral: Negative for sleep disturbance, dysphoric mood and agitation. The patient is not nervous/anxious.        Objective:   Physical Exam  Constitutional: He is oriented to person, place, and time. He appears well-developed. No distress.  NAD  HENT:  Mouth/Throat: No oropharyngeal exudate.  eryth throat  Eyes: Conjunctivae are normal. Pupils are equal, round, and reactive to light.  Neck: Normal range of motion. No JVD present. No thyromegaly present.  Cardiovascular: Normal rate, regular rhythm, normal heart sounds and intact distal pulses.  Exam  reveals no gallop and no friction rub.   No murmur heard. Pulmonary/Chest: Effort normal and breath sounds normal. No respiratory distress. He has no wheezes. He has no rales. He exhibits no tenderness.  Abdominal: Soft. Bowel sounds are normal. He exhibits no distension and no mass. There is no tenderness. There is no rebound and no guarding.  Musculoskeletal: Normal range of motion. He exhibits no edema and no tenderness.  Lymphadenopathy:    He has no cervical adenopathy.  Neurological: He is alert and oriented to person, place, and time. He has normal reflexes. No cranial nerve deficit. He exhibits normal muscle tone. He displays a negative Romberg sign. Coordination and gait normal.  No meningeal signs  Skin: Skin is warm and dry. No rash noted.  Psychiatric: He has a normal mood and affect. His behavior is normal. Judgment and thought content normal.          Assessment & Plan:

## 2014-01-08 NOTE — Assessment & Plan Note (Signed)
Prom-cod syr prn Augnmentin x 10 d

## 2014-01-09 ENCOUNTER — Encounter: Payer: Self-pay | Admitting: Internal Medicine

## 2014-01-24 ENCOUNTER — Encounter: Payer: Self-pay | Admitting: Internal Medicine

## 2014-01-24 ENCOUNTER — Ambulatory Visit (INDEPENDENT_AMBULATORY_CARE_PROVIDER_SITE_OTHER): Payer: Medicare Other | Admitting: Internal Medicine

## 2014-01-24 VITALS — BP 138/90 | HR 62 | Temp 98.1°F | Ht 69.5 in | Wt 261.0 lb

## 2014-01-24 DIAGNOSIS — Z7901 Long term (current) use of anticoagulants: Secondary | ICD-10-CM | POA: Diagnosis not present

## 2014-01-24 DIAGNOSIS — R7309 Other abnormal glucose: Secondary | ICD-10-CM | POA: Diagnosis not present

## 2014-01-24 DIAGNOSIS — I10 Essential (primary) hypertension: Secondary | ICD-10-CM | POA: Diagnosis not present

## 2014-01-24 DIAGNOSIS — I4891 Unspecified atrial fibrillation: Secondary | ICD-10-CM

## 2014-01-24 DIAGNOSIS — E119 Type 2 diabetes mellitus without complications: Secondary | ICD-10-CM

## 2014-01-24 DIAGNOSIS — E785 Hyperlipidemia, unspecified: Secondary | ICD-10-CM

## 2014-01-24 NOTE — Patient Instructions (Addendum)
Limit your carbohydrates to 30 grams per meal ( 90 per day ) Please complete the following lab tests before your next follow up appointment: BMET, A1c - 790.29 FLP, LFTs - 272.4

## 2014-01-24 NOTE — Assessment & Plan Note (Addendum)
Patient has mild type 2 diabetes. Last A1c in 2014 was 6.5. Continue to monitor A1c. He is currently diet controlled. If worsening A1c we discussed starting metformin. Patient encouraged to adhere to 30 g per meal carbohydrate diet.  Regular exercise and weight loss strongly encouraged.

## 2014-01-24 NOTE — Assessment & Plan Note (Signed)
Monitor FLP and LFTs before next OV. 

## 2014-01-24 NOTE — Assessment & Plan Note (Signed)
Patient is rate controlled without any AV nodal agents. He has history of paroxysmal atrial fibrillation and is anticoagulated on Xarelto.  No abnormal bleeding.  Monitor renal function and CBC.  Managed by [facility] cardiology.

## 2014-01-24 NOTE — Progress Notes (Signed)
Subjective:    Patient ID: Guy Moore, male    DOB: 11/21/1933, 80 y.o.   MRN: [id number]  HPI  80 year old white male with history of A. Fib/A. Flutter, obesity, type 2 diabetes and obstructive sleep apnea for continuity of care. Patient transferring from Dr. Linda Hedges.  Patient initially diagnosed with atrial fibrillation in 2005. He was initially treated by [facility] cardiology. He failed multiple cardioversions and was referred to a electrophysiologist at [facility]. He underwent 4 ablations.  Patient noted to have persistent paroxysmal atrial for ablation. He was initially on warfarin but was transitioned Xarelto 1-2 years ago. He reports no issues with abnormal bleeding. His rate is controlled without AV nodal agents.  DM II diabetes (mild) - he is diet controlled.  He is not counting carbs.  He walks regularly.  Patient denies any paresthesias.   Lab Results  Component Value Date   HGBA1C 6.5 03/14/2013     He also has history of obstructive sleep apnea. He uses CPAP regularly.  Hyperlipidemia-patient tolerating atorvastatin without difficulty.  Hypertension - home BP readings well controlled.  Patient's pressures sporadically elevated today. He attributes to recent higher salt intake.  Review of Systems Negative for chest pain, negative for dyspnea with exertion or shortness of breath    Past Medical History  Diagnosis Date  . Atrial fibrillation   . Atrial flutter   . HTN (hypertension)   . Other and unspecified hyperlipidemia   . Type II or unspecified type diabetes mellitus without mention of complication, not stated as uncontrolled     Diet control   . Hyperglycemia   . BCC (basal cell carcinoma of skin)     Nose  . Knee problem     2% permanent partial impairment Right  . Gastric ulcer   . Hiatal hernia   . Allergic rhinitis   . Hearing loss     uses amplification  . Diverticulosis   . Erosive esophagitis   . Esophageal stricture   . Arthritis   . Depression   .  Sleep apnea   . Hemorrhoids     History   Social History  . Marital Status: Married    Spouse Name: N/A    Number of Children: N/A  . Years of Education: N/A   Occupational History  . Retired at 46Print production planner   . 97 years [employer]   . Med Environmental education officer   . 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 Smoking status: Former Smoker -- 1.00 packs/day for 28 years    Types: Cigarettes    Quit date: 10/12/1977  . Smokeless tobacco: Never Used  . Alcohol Use: Yes     Comment: Occasional  . Drug Use: No  . Sexual Activity: Not on file   Other Topics Concern  . Not on file   Social History Narrative   END OF LIFE CARE: he does not want CPR, short term mechanical ventilation. He does not want heroic or futile care especially if he cannon communicate, read and enjoy his loved ones.      Past Surgical History  Procedure Laterality Date  . Uvulopalatopharyngoplasty  1980's  . Vasectomy  7510'C    w/complications  . Cardiac electrophysiology study and ablation      x 4   . Nose surgery      Family History  Problem Relation Age of Onset  . Stroke Mother   . Breast cancer Mother   .  Heart disease Father   . Hypertension Father   . Cancer Father     Bladder  . Breast cancer Brother   . Atrial fibrillation Brother   . Tremor Brother   . Stroke Brother   . Colon cancer Neg Hx     No Known Allergies  Current Outpatient Prescriptions on File Prior to Visit  Medication Sig Dispense Refill  . amLODipine (NORVASC) 5 MG tablet Take 1 tablet (5 mg total) by mouth daily.  90 tablet  3  . DIOVAN 320 MG tablet TAKE 1 TABLET DAILY  90 tablet  3  . hydrocortisone (ANUSOL-HC) 25 MG suppository Place 25 mg rectally as needed.       Marland Kitchen MAGNESIUM GLUCONATE PO Take by mouth daily.        . Methylcellulose, Laxative, (CITRUCEL PO) Take by mouth as needed.        Marland Kitchen NEXIUM 40 MG capsule TAKE 1 CAPSULE EVERY MORNING  90 capsule  3  . potassium chloride SA  (K-DUR,KLOR-CON) 20 MEQ tablet TAKE 1 TABLET DAILY  90 tablet  2  . pravastatin (PRAVACHOL) 40 MG tablet TAKE 1 TABLET AT BEDTIME  90 tablet  3  . Rivaroxaban (XARELTO) 20 MG TABS tablet Take 1 tablet (20 mg total) by mouth daily. Take 20 mg by mouth daily., Disp-90 tablet, R-3  30 tablet  3   No current facility-administered medications on file prior to visit.    BP 138/90  Pulse 62  Temp(Src) 98.1 F (36.7 C) (Oral)  Ht 5' 9.5" (1.765 m)  Wt 261 lb (118.389 kg)  BMI 38.00 kg/m2     Objective:   Physical Exam  Constitutional: He is oriented to person, place, and time. He appears well-developed and well-nourished.  HENT:  Head: Normocephalic and atraumatic.  Neck: Neck supple.  No carotid bruit  Cardiovascular: Normal rate, regular rhythm and normal heart sounds.   No murmur heard. Pulmonary/Chest: Effort normal and breath sounds normal. He has no wheezes.  Abdominal:  Obese, Nontender  Musculoskeletal: He exhibits edema.  Neurological: He is alert and oriented to person, place, and time. No cranial nerve deficit.  Skin: Skin is warm and dry.  Psychiatric: He has a normal mood and affect. His behavior is normal.          Assessment & Plan:

## 2014-01-24 NOTE — Assessment & Plan Note (Signed)
BP high normal today.  No change in medication regimen. Patient urged to exercise a regular basis to reduce his sodium intake.  We discussed the importance of weight loss.  BP: 138/90 mmHg

## 2014-01-24 NOTE — Progress Notes (Signed)
Pre visit review using our clinic review tool, if applicable. No additional management support is needed unless otherwise documented below in the visit note. 

## 2014-01-25 LAB — CBC
HEMATOCRIT: 40.3 % (ref 39.0–52.0)
HEMOGLOBIN: 13.4 g/dL (ref 13.0–17.0)
MCHC: 33.1 g/dL (ref 30.0–36.0)
MCV: 89.1 fl (ref 78.0–100.0)
Platelets: 163 10*3/uL (ref 150.0–400.0)
RBC: 4.53 Mil/uL (ref 4.22–5.81)
RDW: 14.9 % — ABNORMAL HIGH (ref 11.5–14.6)
WBC: 4.8 10*3/uL (ref 4.5–10.5)

## 2014-01-25 LAB — HEMOGLOBIN A1C: HEMOGLOBIN A1C: 6.3 % (ref 4.6–6.5)

## 2014-01-25 LAB — BASIC METABOLIC PANEL
BUN: 15 mg/dL (ref 6–23)
CHLORIDE: 105 meq/L (ref 96–112)
CO2: 28 mEq/L (ref 19–32)
CREATININE: 1.1 mg/dL (ref 0.4–1.5)
Calcium: 9.1 mg/dL (ref 8.4–10.5)
GFR: 72.57 mL/min (ref >60.00)
GLUCOSE: 95 mg/dL (ref 70–99)
POTASSIUM: 4.3 meq/L (ref 3.5–5.1)
Sodium: 140 mEq/L (ref 135–145)

## 2014-02-07 ENCOUNTER — Telehealth: Payer: Self-pay

## 2014-02-07 NOTE — Telephone Encounter (Signed)
Relevant patient education assigned to patient using Emmi. ° °

## 2014-02-14 DIAGNOSIS — I498 Other specified cardiac arrhythmias: Secondary | ICD-10-CM | POA: Diagnosis not present

## 2014-02-14 DIAGNOSIS — I4891 Unspecified atrial fibrillation: Secondary | ICD-10-CM | POA: Diagnosis not present

## 2014-02-14 DIAGNOSIS — I10 Essential (primary) hypertension: Secondary | ICD-10-CM | POA: Diagnosis not present

## 2014-02-14 DIAGNOSIS — G4733 Obstructive sleep apnea (adult) (pediatric): Secondary | ICD-10-CM | POA: Diagnosis not present

## 2014-02-21 DIAGNOSIS — I498 Other specified cardiac arrhythmias: Secondary | ICD-10-CM | POA: Diagnosis not present

## 2014-02-21 DIAGNOSIS — Z7901 Long term (current) use of anticoagulants: Secondary | ICD-10-CM | POA: Diagnosis not present

## 2014-02-21 DIAGNOSIS — I4891 Unspecified atrial fibrillation: Secondary | ICD-10-CM | POA: Diagnosis not present

## 2014-02-21 DIAGNOSIS — Z5181 Encounter for therapeutic drug level monitoring: Secondary | ICD-10-CM | POA: Diagnosis not present

## 2014-03-15 ENCOUNTER — Encounter: Payer: Medicare Other | Admitting: Internal Medicine

## 2014-03-24 ENCOUNTER — Ambulatory Visit (INDEPENDENT_AMBULATORY_CARE_PROVIDER_SITE_OTHER): Payer: Medicare Other | Admitting: Family Medicine

## 2014-03-24 ENCOUNTER — Encounter: Payer: Self-pay | Admitting: Family Medicine

## 2014-03-24 VITALS — BP 122/80 | HR 52 | Temp 97.8°F | Resp 20 | Ht 69.5 in | Wt 259.0 lb

## 2014-03-24 DIAGNOSIS — B029 Zoster without complications: Secondary | ICD-10-CM | POA: Diagnosis not present

## 2014-03-24 MED ORDER — TRIAMCINOLONE ACETONIDE 0.1 % EX OINT: 1.0000 "application " | TOPICAL_OINTMENT | Freq: Two times a day (BID) | CUTANEOUS | Status: DC

## 2014-03-24 MED ORDER — VALACYCLOVIR HCL 1 G PO TABS: 1000.0000 mg | ORAL_TABLET | Freq: Three times a day (TID) | ORAL | Status: DC

## 2014-03-24 MED ORDER — HYDROCODONE-ACETAMINOPHEN 5-325 MG PO TABS: 1.0000 | ORAL_TABLET | Freq: Four times a day (QID) | ORAL | Status: DC | PRN

## 2014-03-24 NOTE — Progress Notes (Signed)
   Subjective:    Patient ID: Guy Moore, male    DOB: 11/29/1933, 80 y.o.   MRN: [id number]  HPI Rash- pt reports last weekend he felt a 'pimple' when washing his L ear.  Monday developed a 'burning' sensation on his ear and felt a blister.  Initially thought it was poison ivy.  Applied cortisone cream.  Rash and pain then worsened.  Now has lesions in hair and down back of neck- irritated by CPAP strap.   Review of Systems For ROS see HPI     Objective:   Physical Exam  Vitals reviewed. Constitutional: He appears well-developed and well-nourished. No distress.  Skin: Skin is warm and dry. Rash (painful, vesicular rash on L outer ear, on scalp behind L ear) noted.          Assessment & Plan:

## 2014-03-24 NOTE — Assessment & Plan Note (Signed)
New.  Reviewed dx w/ pt.  Will start Valtrex (even though it's outside the usual treatment window).  Pain meds prn.  Reviewed supportive care and red flags that should prompt return.  Pt expressed understanding and is in agreement w/ plan.

## 2014-03-24 NOTE — Patient Instructions (Signed)
Follow up as needed This is shingles- start the Valtrex today Use the triamcinolone ointment for topical relief Take the hydrocodone as needed for pain This is only contagious/dangerous to pregnant woman or young children who have not had chicken pox(they would need to touch the lesions)  Call with any questions or concerns Hang in there!

## 2014-03-24 NOTE — Progress Notes (Signed)
Pre-visit discussion using our clinic review tool. No additional management support is needed unless otherwise documented below in the visit note.  

## 2014-03-29 ENCOUNTER — Telehealth: Payer: Self-pay | Admitting: Internal Medicine

## 2014-03-29 NOTE — Telephone Encounter (Signed)
[facility] Triage Call Report Triage Record Num: [id number] Operator: Leota Sauers Patient Name: Nazeer Romney Call Date & Time: 03/24/2014 8:21:07AM Patient Phone: [phone] PCP: Adella Hare Patient Gender: Male PCP Fax : Patient DOB: 28-Dec-1933 Practice Name: [facility] Reason for Call: Caller: Aiyden/Patient; PCP: Alyson Ingles [facility] [place]) (Adults only); CB#: [phone]; Call regarding Rash/Hives; Onset 03/17/14 with a small bump behind his left ear followed by rash he thought may be poison ivy; confirmed by RPh on 03/19/14. Improved with hydrocortisone cream. Now ear is swelling and the side of head is tingly and he is tender on the back of his head where the CPAP strap crosses his head and ear lobe swelling onset 03/24/14. Mild itching; burning pain that worsened since 03/23/14. He also reports that now the spots appear dark per his wife. Emergent symptoms ruled out. See Provider within 4 hours per Rash guideline due to Signs and symptoms of worsening infection. Caller agreed. Appointment scheduled for11:15 with Dr. Birdie Riddle at [place] office. Protocol(s) Used: Rash Protocol(s) Used: Skin Lesions Recommended Outcome per Protocol: See Provider within 4 hours Reason for Outcome: Localized or widespread rash Signs and symptoms of worsening infection (quickly getting larger or more areas develop; becoming more painful; purulent drainage; new fever; not improving with treatment) Care Advice: ~ Call provider if symptoms worsen or new symptoms develop. ~ SYMPTOM / CONDITION MANAGEMENT 06/

## 2014-04-03 DIAGNOSIS — I4891 Unspecified atrial fibrillation: Secondary | ICD-10-CM | POA: Diagnosis not present

## 2014-04-08 ENCOUNTER — Emergency Department (INDEPENDENT_AMBULATORY_CARE_PROVIDER_SITE_OTHER)
Admission: EM | Admit: 2014-04-08 | Discharge: 2014-04-08 | Disposition: A | Discharge status: Home / Self Care | Payer: Medicare Other | Source: Home / Self Care | Attending: Emergency Medicine | Admitting: Emergency Medicine

## 2014-04-08 ENCOUNTER — Encounter (HOSPITAL_COMMUNITY): Payer: Self-pay | Admitting: Emergency Medicine

## 2014-04-08 DIAGNOSIS — B029 Zoster without complications: Secondary | ICD-10-CM

## 2014-04-08 MED ORDER — ERYTHROMYCIN 5 MG/GM OP OINT: TOPICAL_OINTMENT | OPHTHALMIC | Status: DC

## 2014-04-08 MED ORDER — VALACYCLOVIR HCL 1 G PO TABS: 1000.0000 mg | ORAL_TABLET | Freq: Three times a day (TID) | ORAL | Status: DC

## 2014-04-08 NOTE — ED Notes (Signed)
Patient reports he recently had shingles around the left ear and was treated. Now his left eye is inflamed and painful. Patient reports he noticed a white blister on his lower lid and has a mucous drainage. Patient is alert and oriented and in no acute distress.

## 2014-04-08 NOTE — Discharge Instructions (Signed)
Mr. Camino,   Please use the valtrex three times a day for the next 7-10 days. Also, use the erythromycin ointment for 5 days.   Follow up in the hospital if you develop worsening symptoms of vision loss, eye pain or inflammation.   Sincerely,   Dr. Maricela Bo

## 2014-04-08 NOTE — ED Provider Notes (Signed)
CSN: [id number]     Arrival date & time 04/08/14  1044 History   First MD Initiated Contact with Patient 04/08/14 1211     Chief Complaint  Patient presents with  . Eye Problem   (Consider location/radiation/quality/duration/timing/severity/associated sxs/prior Treatment) HPI  Shingles infection with eye involvement - pt with recent diagnosis of shingles on the left side of his face ad around the left ear and posterior scalp diagnosed on 03/24/14. It was treated with valacyclovir 1000 mg TID PO x 7 days. It was improving with lesion resolution and decreased pain until today when he developed a small lesion on the lower eye lid. It is mildly painful and itching. It is not associated with pain or itching of the globe of the eye. He does not think that his vision has changed and wears glasses.   Past Medical History  Diagnosis Date  . Atrial fibrillation   . Atrial flutter   . HTN (hypertension)   . Other and unspecified hyperlipidemia   . Type II or unspecified type diabetes mellitus without mention of complication, not stated as uncontrolled     Diet control   . Hyperglycemia   . BCC (basal cell carcinoma of skin)     Nose  . Knee problem     2% permanent partial impairment Right  . Gastric ulcer   . Hiatal hernia   . Allergic rhinitis   . Hearing loss     uses amplification  . Diverticulosis   . Erosive esophagitis   . Esophageal stricture   . Arthritis   . Depression   . Sleep apnea   . Hemorrhoids    Past Surgical History  Procedure Laterality Date  . Uvulopalatopharyngoplasty  1980's  . Vasectomy  7106'Y    w/complications  . Cardiac electrophysiology study and ablation      x 4   . Nose surgery     Family History  Problem Relation Age of Onset  . Stroke Mother   . Breast cancer Mother   . Heart disease Father   . Hypertension Father   . Cancer Father     Bladder  . Breast cancer Brother   . Atrial fibrillation Brother   . Tremor Brother   . Stroke Brother    . Colon cancer Neg Hx    History  Substance Use Topics  . Smoking status: Former Smoker -- 1.00 packs/day for 28 years    Types: Cigarettes    Quit date: 10/12/1977  . Smokeless tobacco: Never Used  . Alcohol Use: Yes     Comment: Occasional    Review of Systems Positive for healing zoster rash on left side of face and scalp; small punctate lesion on the lower eyelid Name for fever, chills, nausea, vomiting, difficulty with eye movement, vision changes Allergies  Review of patient's allergies indicates no known allergies.  Home Medications   Prior to Admission medications   Medication Sig Start Date End Date Taking? Authorizing Provider  amLODipine (NORVASC) 5 MG tablet Take 1 tablet (5 mg total) by mouth daily. 09/29/13   Neena Rhymes, MD  DIOVAN 320 MG tablet TAKE 1 TABLET DAILY 10/27/13   Neena Rhymes, MD  erythromycin [facility]) ophthalmic ointment Apply 1/2 inch band to left eye twice a day for 5 days 04/08/14   Angelica Ran, MD  HYDROcodone-acetaminophen (NORCO/VICODIN) 5-325 MG per tablet Take 1 tablet by mouth every 6 (six) hours as needed for moderate pain. 03/24/14   Aundra Millet  Birdie Riddle, MD  hydrocortisone (ANUSOL-HC) 25 MG suppository Place 25 mg rectally as needed.     Historical Provider, MD  loratadine (CLARITIN) 10 MG tablet Take 10 mg by mouth daily.    Historical Provider, MD  MAGNESIUM GLUCONATE PO Take by mouth daily.      Historical Provider, MD  Methylcellulose, Laxative, (CITRUCEL PO) Take by mouth as needed.      Historical Provider, MD  metoprolol succinate (TOPROL-XL) 25 MG 24 hr tablet Take 25 mg by mouth daily.    Historical Provider, MD  NEXIUM 40 MG capsule TAKE 1 CAPSULE EVERY MORNING 09/28/13   Neena Rhymes, MD  potassium chloride SA (K-DUR,KLOR-CON) 20 MEQ tablet TAKE 1 TABLET DAILY 05/02/13   Neena Rhymes, MD  pravastatin (PRAVACHOL) 40 MG tablet TAKE 1 TABLET AT BEDTIME 09/28/13   Neena Rhymes, MD  Rivaroxaban (XARELTO) 20 MG  TABS tablet Take 1 tablet (20 mg total) by mouth daily. Take 20 mg by mouth daily., Disp-90 tablet, R-3 09/15/13   Neena Rhymes, MD  triamcinolone ointment (KENALOG) 0.1 % Apply 1 application topically 2 (two) times daily. 03/24/14   Midge Minium, MD  valACYclovir (VALTREX) 1000 MG tablet Take 1 tablet (1,000 mg total) by mouth 3 (three) times daily. 04/08/14   Angelica Ran, MD   BP 174/71  Pulse 65  Temp(Src) 98.5 F (36.9 C) (Oral)  Resp 18  SpO2 100% Physical Exam Gen: elderly white male, obese, non ill appearing, pleasant HEENT: NCAT, PERRLA, EOMI, left eye  Skin: healing zoster lesion on left occiput, no neuraligia or allodynia  ED Course  Procedures (including critical care time) Labs Review Labs Reviewed - No data to display  Imaging Review No results found.   MDM   1. Herpes zoster infection    Patient with left periauricular, occipital and facial lesions that are healing. He does have a new lesion, which is very small, on the left lower eyelid. It is not affecting his vision or causing an infection of the eye itself. After speaking with the ophthalmologist on call, Lenox Ahr, it was decided that he is appropriate for treatment with by mouth Valtrex and topical erythromycin. He will followup with referral ophthalmology in the outpatient setting. He was given precautions for return.    Angelica Ran, MD 04/08/14 1254

## 2014-04-09 ENCOUNTER — Telehealth: Payer: Self-pay

## 2014-04-09 DIAGNOSIS — H2510 Age-related nuclear cataract, unspecified eye: Secondary | ICD-10-CM | POA: Diagnosis not present

## 2014-04-09 DIAGNOSIS — H3530 Unspecified macular degeneration: Secondary | ICD-10-CM | POA: Diagnosis not present

## 2014-04-09 DIAGNOSIS — H0019 Chalazion unspecified eye, unspecified eyelid: Secondary | ICD-10-CM | POA: Diagnosis not present

## 2014-04-09 DIAGNOSIS — H01009 Unspecified blepharitis unspecified eye, unspecified eyelid: Secondary | ICD-10-CM | POA: Diagnosis not present

## 2014-04-09 NOTE — Telephone Encounter (Signed)
Pt went to [facility] yesterday

## 2014-04-09 NOTE — Telephone Encounter (Signed)
Call Report Triage Record Num: [id number] Operator: Geradine Girt Patient Name: Guy Moore Call Date & Time: 04/08/2014 10:03:52AM Patient Phone: [phone] PCP: Adella Hare Patient Gender: Male PCP Fax : Patient DOB: 1934/05/08 Practice Name: [facility] Reason for Call: Caller: Leonel/Patient; PCP: Alyson Ingles [facility] [place]) (Adults only); CB#: [phone]; Pt dx with shingles 03/25/14 & completed Valtrex & seemed to be resolving. This morning (04/08/14) noticed white small bump with itching & burning that is mildly swollen - along the lower lashline. Afebrile. Triaged per Skin Lesions Guideline; Disposition: see provider within 4 hrs per "undiagnosed blister like rash on or near eye". Reviewed care advice & advised pt to go to [facility] evaluation. Pt states he will go to [facility]. Protocol(s) Used: Skin Lesions Recommended Outcome per Protocol: See Provider within 4 hours Reason for Outcome: Known herpes zoster or undiagnosed blister-like rash on or near eye or on tip of nose Care Advice: Antiviral medication may be prescribed by provider to limit symptoms but it should be started within 48 hours of the onset of the symptoms. ~ Avoid touching or rubbing the eyes; wash hands frequently and do not share towels, washcloths or other personal care items with others. ~ ~ Avoid scratching affected area. Avoid physical contact with anyone who has never had chickenpox or with pregnant women, newborns or immunocompromised individuals. Blisters are contagious until all break and are crusted. ~ Do not touch, scratch, or break blisters. Intact blisters are thought to reduce pain and decrease chance of secondary infection. Cover blisters until they are crusted over. Apply cool wet compresses to the affected area for 10-15 minutes to help relieve pain and itching.

## 2014-04-10 NOTE — ED Provider Notes (Signed)
Medical screening examination/treatment/procedure(s) were performed by a resident physician and as supervising physician I was immediately available for consultation/collaboration.  Philipp Deputy, M.D.  Harden Mo, MD 04/10/14 1754

## 2014-04-26 ENCOUNTER — Telehealth: Payer: Self-pay | Admitting: Internal Medicine

## 2014-04-26 NOTE — Telephone Encounter (Signed)
Patient Information:  Caller Name: Suzanne Boron  Phone: [phone]  Patient: Guy Moore, Guy Moore  Gender: Male  DOB: [birth year]  Age: 80 Years  PCP: Shawna Orleans Doe-Hyun [facility] [place]) (Adults only)  Office Follow Up:  Does the office need to follow up with this patient?: Yes  Instructions For The Office: Pls see RN note.  RN Note:  Shingles Flare up, onset 7-16.  Pt has Hx of Shingles on Left side of Neck was treated on 6-13 at [facility], Dr Birdie Riddle, Valtrex given. Meds improved Shingle Rash.  Pt has same rash on Left side of Neck and would like to know if he can restart the valtrex.   Pt has 6 days worth of Valtrex remaining.  All emergent sxs ruled out per Skin Lesion and Rash localized protocols, home care.  Pt feels confident Rash is Shingles. Please reveiw sxs w/ MD and f/u w/ Pt if MD feels ok to restart remaining Valtrex.  Symptoms  Reason For Call & Symptoms: Shingles Flare up, onset 7-16  Reviewed Health History In EMR: Yes  Reviewed Medications In EMR: Yes  Reviewed Allergies In EMR: Yes  Reviewed Surgeries / Procedures: Yes  Date of Onset of Symptoms: 04/26/2014  Guideline(s) Used:  Skin Lesion - Moles or Growths  Rash or Redness - Localized  Disposition Per Guideline:   Home Care  Reason For Disposition Reached:   Mild localized rash  Advice Given:  N/A  Patient Will Follow Care Advice:  YES

## 2014-04-27 NOTE — Telephone Encounter (Signed)
Yes.  He can restart valtrex.  He should be seen next week.

## 2014-04-27 NOTE — Telephone Encounter (Signed)
Pt aware and sent to scheduling for f/u appointment

## 2014-04-30 ENCOUNTER — Ambulatory Visit (INDEPENDENT_AMBULATORY_CARE_PROVIDER_SITE_OTHER): Payer: Medicare Other | Admitting: Family Medicine

## 2014-04-30 ENCOUNTER — Encounter: Payer: Self-pay | Admitting: Family Medicine

## 2014-04-30 ENCOUNTER — Telehealth: Payer: Self-pay | Admitting: Internal Medicine

## 2014-04-30 VITALS — BP 130/84 | HR 64 | Temp 97.9°F | Wt 264.0 lb

## 2014-04-30 DIAGNOSIS — B029 Zoster without complications: Secondary | ICD-10-CM | POA: Diagnosis not present

## 2014-04-30 LAB — CBC WITH DIFFERENTIAL/PLATELET
Basophils Absolute: 0 10*3/uL (ref 0.0–0.1)
Basophils Relative: 0.6 % (ref 0.0–3.0)
EOS PCT: 6.2 % — AB (ref 0.0–5.0)
Eosinophils Absolute: 0.2 10*3/uL (ref 0.0–0.7)
HEMATOCRIT: 40 % (ref 39.0–52.0)
Hemoglobin: 13.4 g/dL (ref 13.0–17.0)
LYMPHS ABS: 0.6 10*3/uL — AB (ref 0.7–4.0)
Lymphocytes Relative: 19.7 % (ref 12.0–46.0)
MCHC: 33.4 g/dL (ref 30.0–36.0)
MCV: 88.9 fl (ref 78.0–100.0)
MONO ABS: 0.3 10*3/uL (ref 0.1–1.0)
Monocytes Relative: 9.2 % (ref 3.0–12.0)
Neutro Abs: 2.1 10*3/uL (ref 1.4–7.7)
Neutrophils Relative %: 64.3 % (ref 43.0–77.0)
PLATELETS: 140 10*3/uL — AB (ref 150.0–400.0)
RBC: 4.5 Mil/uL (ref 4.22–5.81)
RDW: 16.3 % — ABNORMAL HIGH (ref 11.5–15.5)
WBC: 3.2 10*3/uL — ABNORMAL LOW (ref 4.0–10.5)

## 2014-04-30 MED ORDER — VALACYCLOVIR HCL 1 G PO TABS: 1000.0000 mg | ORAL_TABLET | Freq: Three times a day (TID) | ORAL | Status: DC

## 2014-04-30 MED ORDER — VALACYCLOVIR HCL 1 G PO TABS
1000.0000 mg | ORAL_TABLET | Freq: Three times a day (TID) | ORAL | Status: DC
Start: 2014-04-30 — End: 2014-04-30

## 2014-04-30 NOTE — Assessment & Plan Note (Signed)
This appears to be recurrent shingles at C4 where last lesions appeared in C2 distribution likely. Given quick recurrence within a months span, I have ordered HIV and CBC today to evaluate for immunocompromised state. Refilled valtrex so patient could take full 7 days.

## 2014-04-30 NOTE — Patient Instructions (Signed)
Shingles-take pills through Thursday evening  Checking CBC and HIV today. Will call with results.   Sent message to [facility] about switching to full time PCP.

## 2014-04-30 NOTE — Progress Notes (Signed)
Pre visit review using our clinic review tool, if applicable. No additional management support is needed unless otherwise documented below in the visit note. 

## 2014-04-30 NOTE — Progress Notes (Signed)
Guy Reddish, MD Phone: [phone]  Subjective:   Guy Moore is a 80 y.o. year old very pleasant male patient who presents with the following:  Rash Patient with shingles near his left ear and neck starting 03/24/14 and underwent treatment with Valtrex. He was seen 2 weeks later for a lesion on his left eyelid and was sent to ophthalmology for concern of optic involvement. Per patient, this was rules out, but that red eyelid was deemed consequence of herpes zoster but not direct involvement. Other than this eye lesion, most lesions and pain resolved within 2 weeks of treatment with valtrex.   Patient returns today stating that on 7/15 he noted pain and tingling and noted 3 small bumps on the back of the left side of his neck. He called and was told he could take remaining valtrex. He states within a day, the pain has subsided and the size of the bumps shrank. Per wife, these areas were vesicles when they came up. They tried warm compresses which helped some as well. He believes he started valtrex within 24 hours of first noticing. He admits to some mild headache which has resolved. Still has skin sensitivity on scalp from first bout of shingles.  ROS- no blurry vision or changes in hearing. not ill appearing, no fever/chills. No new medications. Not known to be immunocompromised. No mucus membrane involvement.   Past Medical History- shingles, GERD, a fib on xarelto, OSA, HLD, HTN, diverticulosis  Medications- reviewed and updated Current Outpatient Prescriptions  Medication Sig Dispense Refill  . amLODipine (NORVASC) 5 MG tablet Take 1 tablet (5 mg total) by mouth daily.  90 tablet  3  . DIOVAN 320 MG tablet TAKE 1 TABLET DAILY  90 tablet  3  . HYDROcodone-acetaminophen (NORCO/VICODIN) 5-325 MG per tablet Take 1 tablet by mouth every 6 (six) hours as needed for moderate pain.  30 tablet  0  . hydrocortisone (ANUSOL-HC) 25 MG suppository Place 25 mg rectally as needed.       .  loratadine (CLARITIN) 10 MG tablet Take 10 mg by mouth daily.      Marland Kitchen MAGNESIUM GLUCONATE PO Take by mouth daily.        . Methylcellulose, Laxative, (CITRUCEL PO) Take by mouth as needed.        . metoprolol succinate (TOPROL-XL) 25 MG 24 hr tablet Take 25 mg by mouth daily.      Marland Kitchen NEXIUM 40 MG capsule TAKE 1 CAPSULE EVERY MORNING  90 capsule  3  . potassium chloride SA (K-DUR,KLOR-CON) 20 MEQ tablet TAKE 1 TABLET DAILY  90 tablet  2  . pravastatin (PRAVACHOL) 40 MG tablet TAKE 1 TABLET AT BEDTIME  90 tablet  3  . Rivaroxaban (XARELTO) 20 MG TABS tablet Take 1 tablet (20 mg total) by mouth daily. Take 20 mg by mouth daily., Disp-90 tablet, R-3  30 tablet  3  . triamcinolone ointment (KENALOG) 0.1 % Apply 1 application topically 2 (two) times daily.  90 g  1  . valACYclovir (VALTREX) 1000 MG tablet Take 1 tablet (1,000 mg total) by mouth 3 (three) times daily.  30 tablet  0   No current facility-administered medications for this visit.    Objective: BP 130/84  Pulse 64  Temp(Src) 97.9 F (36.6 C)  Wt 264 lb (119.75 kg) Gen: NAD, resting comfortably CV: RRR (with 1 ectopic beat noted over 60 seconds) no murmurs rubs or gallops Lungs: CTAB no crackles, wheeze, rhonchi Ext: no edema Skin:  warm, dry, 3 small papules in what appear to be c4 distribution on left back side of neck Neuro: CN II-XII intact, sensation and reflexes normal throughout, 5/5 muscle strength in bilateral upper and lower extremities. Normal finger to nose. Normal rapid alternating movements. Normal gait.   Assessment/Plan:  Shingles This appears to be recurrent shingles at C4 where last lesions appeared in C2 distribution likely. Given quick recurrence within a months span, I have ordered HIV and CBC today to evaluate for immunocompromised state. Refilled valtrex so patient could take full 7 days.    Orders Placed This Encounter  Procedures  . CBC with Differential  . HIV antibody    [facility]    Meds ordered  this encounter  Medications  . DISCONTD: valACYclovir (VALTREX) 1000 MG tablet    Sig: Take 1 tablet (1,000 mg total) by mouth 3 (three) times daily.    Dispense:  30 tablet    Refill:  0  . valACYclovir (VALTREX) 1000 MG tablet    Sig: Take 1 tablet (1,000 mg total) by mouth 3 (three) times daily.    Dispense:  30 tablet    Refill:  0

## 2014-04-30 NOTE — Telephone Encounter (Signed)
Ok with me 

## 2014-04-30 NOTE — Telephone Encounter (Signed)
Message copied by Vaughan Browner on Mon Apr 30, 2014 10:24 AM ------      Message from: Marin Olp      Created: Mon Apr 30, 2014  9:53 AM       Patient loves working with Dr. Shawna Orleans but requesting full time MD or DO. They requested Dr. Sarajane Jews if possible or me if not.  ------

## 2014-04-30 NOTE — Telephone Encounter (Signed)
Pt requesting to switch PCP from [place] to [facility] or Sarajane Jews due to limited availability.  Note- Dr. Sarajane Jews is currently not accepting any new patients.

## 2014-05-01 LAB — HIV ANTIBODY (ROUTINE TESTING W REFLEX): HIV: NONREACTIVE

## 2014-05-18 ENCOUNTER — Other Ambulatory Visit: Payer: Medicare Other

## 2014-05-25 ENCOUNTER — Ambulatory Visit: Payer: Medicare Other | Admitting: Internal Medicine

## 2014-06-22 ENCOUNTER — Ambulatory Visit (INDEPENDENT_AMBULATORY_CARE_PROVIDER_SITE_OTHER): Payer: Medicare Other | Admitting: Pulmonary Disease

## 2014-06-22 ENCOUNTER — Encounter: Payer: Self-pay | Admitting: Pulmonary Disease

## 2014-06-22 ENCOUNTER — Encounter: Payer: Self-pay | Admitting: Family Medicine

## 2014-06-22 VITALS — BP 122/64 | HR 60 | Temp 98.3°F | Ht 70.0 in | Wt 260.2 lb

## 2014-06-22 DIAGNOSIS — G4733 Obstructive sleep apnea (adult) (pediatric): Secondary | ICD-10-CM | POA: Diagnosis not present

## 2014-06-22 NOTE — Patient Instructions (Signed)
Continue on cpap, and keep up with mask changes and supplies. Work on weight loss followup with me again in one year.  

## 2014-06-22 NOTE — Assessment & Plan Note (Signed)
The patient is doing extremely well on CPAP, and is satisfied with his sleep and daytime alertness. He is having no issues with his mask fit or pressure, and has been keeping up with his supplies. I've encouraged him to continue working on weight loss

## 2014-06-22 NOTE — Progress Notes (Signed)
   Subjective:    Patient ID: Guy Moore, male    DOB: Nov 09, 1933, 80 y.o.   MRN: [id number]  HPI The patient comes in today for followup of his obstructive sleep apnea. He is wearing CPAP compliantly, and is having no issues with his mask fit or pressure. He is sleeping well at night, and feels rested in the mornings.   Review of Systems  Constitutional: Negative for fever and unexpected weight change.  HENT: Negative for congestion, dental problem, ear pain, nosebleeds, postnasal drip, rhinorrhea, sinus pressure, sneezing, sore throat and trouble swallowing.   Eyes: Negative for redness and itching.  Respiratory: Negative for cough, chest tightness, shortness of breath and wheezing.   Cardiovascular: Negative for palpitations and leg swelling.  Gastrointestinal: Negative for nausea and vomiting.  Genitourinary: Negative for dysuria.  Musculoskeletal: Negative for joint swelling.  Skin: Negative for rash.  Neurological: Negative for headaches.  Hematological: Does not bruise/bleed easily.  Psychiatric/Behavioral: Negative for dysphoric mood. The patient is not nervous/anxious.        Objective:   Physical Exam Overweight male in no acute distress Nose without purulence or discharge noted Neck without lymphadenopathy or thyromegaly No skin breakdown or pressure necrosis from the CPAP mask Lower extremities with minimal edema, no cyanosis  Alert and oriented, does not appear to be sleepy, moves all 4 extremities.       Assessment & Plan:

## 2014-06-27 ENCOUNTER — Ambulatory Visit (INDEPENDENT_AMBULATORY_CARE_PROVIDER_SITE_OTHER): Payer: Medicare Other | Admitting: Family Medicine

## 2014-06-27 ENCOUNTER — Encounter: Payer: Self-pay | Admitting: Family Medicine

## 2014-06-27 VITALS — BP 138/84 | HR 56 | Temp 97.9°F | Wt 262.0 lb

## 2014-06-27 DIAGNOSIS — Z85828 Personal history of other malignant neoplasm of skin: Secondary | ICD-10-CM | POA: Insufficient documentation

## 2014-06-27 DIAGNOSIS — I4891 Unspecified atrial fibrillation: Secondary | ICD-10-CM

## 2014-06-27 DIAGNOSIS — I10 Essential (primary) hypertension: Secondary | ICD-10-CM | POA: Diagnosis not present

## 2014-06-27 DIAGNOSIS — E785 Hyperlipidemia, unspecified: Secondary | ICD-10-CM

## 2014-06-27 DIAGNOSIS — E119 Type 2 diabetes mellitus without complications: Secondary | ICD-10-CM

## 2014-06-27 DIAGNOSIS — I482 Chronic atrial fibrillation, unspecified: Secondary | ICD-10-CM

## 2014-06-27 LAB — LIPID PANEL
CHOLESTEROL: 118 mg/dL (ref 0–200)
HDL: 52.6 mg/dL (ref >39.00)
LDL Cholesterol: 61 mg/dL (ref 0–99)
NonHDL: 65.4
Total CHOL/HDL Ratio: 2
Triglycerides: 24 mg/dL (ref 0.0–149.0)
VLDL: 4.8 mg/dL (ref 0.0–40.0)

## 2014-06-27 LAB — HEPATIC FUNCTION PANEL
ALK PHOS: 38 U/L — AB (ref 39–117)
ALT: 25 U/L (ref 0–53)
AST: 21 U/L (ref 0–37)
Albumin: 4 g/dL (ref 3.5–5.2)
BILIRUBIN TOTAL: 0.6 mg/dL (ref 0.2–1.2)
Bilirubin, Direct: 0.1 mg/dL (ref 0.0–0.3)
Total Protein: 7 g/dL (ref 6.0–8.3)

## 2014-06-27 LAB — CBC WITH DIFFERENTIAL/PLATELET
BASOS ABS: 0 10*3/uL (ref 0.0–0.1)
Basophils Relative: 0.5 % (ref 0.0–3.0)
Eosinophils Absolute: 0.1 10*3/uL (ref 0.0–0.7)
Eosinophils Relative: 5.2 % — ABNORMAL HIGH (ref 0.0–5.0)
HCT: 39.9 % (ref 39.0–52.0)
Hemoglobin: 13.2 g/dL (ref 13.0–17.0)
LYMPHS ABS: 0.4 10*3/uL — AB (ref 0.7–4.0)
Lymphocytes Relative: 15.6 % (ref 12.0–46.0)
MCHC: 33 g/dL (ref 30.0–36.0)
MCV: 89.8 fl (ref 78.0–100.0)
MONO ABS: 0.2 10*3/uL (ref 0.1–1.0)
MONOS PCT: 8.8 % (ref 3.0–12.0)
Neutro Abs: 1.9 10*3/uL (ref 1.4–7.7)
Neutrophils Relative %: 69.9 % (ref 43.0–77.0)
PLATELETS: 130 10*3/uL — AB (ref 150.0–400.0)
RBC: 4.44 Mil/uL (ref 4.22–5.81)
RDW: 15.5 % (ref 11.5–15.5)
WBC: 2.8 10*3/uL — ABNORMAL LOW (ref 4.0–10.5)

## 2014-06-27 LAB — TSH: TSH: 0.58 u[IU]/mL (ref 0.35–4.50)

## 2014-06-27 LAB — HEMOGLOBIN A1C: Hgb A1c MFr Bld: 6.6 % — ABNORMAL HIGH (ref 4.6–6.5)

## 2014-06-27 NOTE — Assessment & Plan Note (Signed)
Well-controlled on amlodipine 5 mg, Diovan 320 mg, and metoprolol 25 mg. Continue

## 2014-06-27 NOTE — Patient Instructions (Addendum)
Great to see you! Glad things are doing better from the shingles. No medication changes today. Let's check in 6 months from now and we will do a full skin exam since you aren't seeing dermatology anymore.   Health Maintenance Due  Topic Date Due  . Ophthalmology Exam -have them send me a copy when you get this soon 06/26/2010

## 2014-06-27 NOTE — Assessment & Plan Note (Signed)
Well-controlled. In A. fib today. Continue Xarelto for anticoagulation and metoprolol for rate control

## 2014-06-27 NOTE — Assessment & Plan Note (Signed)
Diet controlled. Check A1c today. Updated health maintenance and patient to faxNext eye report to [place].

## 2014-06-27 NOTE — Progress Notes (Signed)
Guy Reddish, MD Phone: [phone]  Subjective:  Patient presents today to establish care with me as their new primary care provider. Patient was formerly a patient of Dr. Linda Hedges. Chief complaint-noted.   DIABETES Type II-stable  Lab Results  Component Value Date   HGBA1C 6.3 01/24/2014   HGBA1C 6.5 03/14/2013   HGBA1C 6.2 03/10/2012  Medications taking and tolerating-no, diet controlled Blood Sugars per patient-fasting-does not check Regular Exercise-normally walks about 45 minutes per day (struggle over last 2 weeks) On Aspirin-no on xarelto On statin-yes Daily foot monitoring-yes Health Maintenance Due  Topic Date Due  . Ophthalmology Exam  06/26/2010   ROS- Denies Polyuria,Polydipsia, nocturia, Vision changes, feet or hand numbness/pain/tingling. Denies  Hypoglycemia symptoms (shaky, sweaty, hungry, weak anxious, tremor, palpitations, confusion, behavior change).   Atrial fibrillation-stable xarelto anticoagulation. Metoprolol rate control. Feeling well. Follows with Dr. Lenard Galloway at [facility].  ROS- no chest pain or shortness of breath  Hypertension-stable, controlled  BP Readings from Last 3 Encounters:  06/27/14 138/84  06/22/14 122/64  04/30/14 130/84  Home BP monitoring-no Compliant with medications-yes without side effects ROS-Denies any CP, SOB, blurry vision, LE edema. OCcasional irritation in area that he had shingles..   The following were reviewed and entered/updated in epic: Past Medical History  Diagnosis Date  . Atrial fibrillation   . Atrial flutter   . HTN (hypertension)   . Other and unspecified hyperlipidemia   . Type II or unspecified type diabetes mellitus without mention of complication, not stated as uncontrolled     Diet control   . Hyperglycemia   . BCC (basal cell carcinoma of skin)     Nose  . Knee problem     2% permanent partial impairment Right  . Gastric ulcer   . Hiatal hernia   . Allergic rhinitis   . Hearing loss     uses  amplification  . Diverticulosis   . Erosive esophagitis   . Esophageal stricture   . Arthritis   . Depression   . Sleep apnea   . Hemorrhoids   . DIVERTICULOSIS, COLON 04/13/2007         Patient Active Problem List   Diagnosis Date Noted  . Atrial fibrillation 04/19/2009    Priority: High  . Diabetes Mellitus-diet controlled 10/21/2007    Priority: High  . OBSTRUCTIVE SLEEP APNEA 07/04/2009    Priority: Medium  . HYPERLIPIDEMIA 04/13/2007    Priority: Medium  . HYPERTENSION 04/13/2007    Priority: Medium  . History of basal cell cancer 06/27/2014    Priority: Low  . Shingles 03/24/2014    Priority: Low  . Bleeding gums 04/15/2013    Priority: Low  . GERD (gastroesophageal reflux disease) 08/31/2011    Priority: Low  . Anticoagulant long-term use 08/31/2011    Priority: Low  . ALLERGIC RHINITIS 04/14/2007    Priority: Low  . Routine health maintenance 03/11/2012   Past Surgical History  Procedure Laterality Date  . Uvulopalatopharyngoplasty  1980's  . Vasectomy  9381'W    w/complications  . Cardiac electrophysiology study and ablation      x 4   . Nose surgery      Family History  Problem Relation Age of Onset  . Stroke Mother   . Breast cancer Mother   . Heart disease Father   . Hypertension Father   . Cancer Father     Bladder  . Breast cancer Brother   . Atrial fibrillation Brother   . Tremor Brother   .  Stroke Brother   . Colon cancer Neg Hx     Medications- reviewed and updated Current Outpatient Prescriptions  Medication Sig Dispense Refill  . amLODipine (NORVASC) 5 MG tablet Take 1 tablet (5 mg total) by mouth daily.  90 tablet  3  . DIOVAN 320 MG tablet TAKE 1 TABLET DAILY  90 tablet  3  . loratadine (CLARITIN) 10 MG tablet Take 10 mg by mouth daily.      Marland Kitchen MAGNESIUM GLUCONATE PO Take by mouth daily.        . Methylcellulose, Laxative, (CITRUCEL PO) Take by mouth as needed.        . metoprolol succinate (TOPROL-XL) 25 MG 24 hr tablet Take 25  mg by mouth daily.      Marland Kitchen NEXIUM 40 MG capsule TAKE 1 CAPSULE EVERY MORNING  90 capsule  3  . potassium chloride SA (K-DUR,KLOR-CON) 20 MEQ tablet TAKE 1 TABLET DAILY  90 tablet  2  . pravastatin (PRAVACHOL) 40 MG tablet TAKE 1 TABLET AT BEDTIME  90 tablet  3  . Rivaroxaban (XARELTO) 20 MG TABS tablet Take 1 tablet (20 mg total) by mouth daily. Take 20 mg by mouth daily., Disp-90 tablet, R-3  30 tablet  3   No current facility-administered medications for this visit.    Allergies-reviewed and updated No Known Allergies  History   Social History  . Marital Status: Married    Spouse Name: N/A    Number of Children: N/A  . Years of Education: N/A   Occupational History  . Retired at 18Print production planner   . 45 years [employer]   . Med Environmental education officer   . 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 Smoking status: Former Smoker -- 1.00 packs/day for 28 years    Types: Cigarettes    Quit date: 10/12/1977  . Smokeless tobacco: Never Used  . Alcohol Use: Yes     Comment: Occasional  . Drug Use: No  . Sexual Activity: None   Other Topics Concern  . None   Social History Narrative   END OF LIFE CARE: he does not want CPR, short term mechanical ventilation. He does not want heroic or futile care especially if he cannon communicate, read and enjoy his loved ones.      ROS--See HPI   Objective: BP 138/84  Pulse 56  Temp(Src) 97.9 F (36.6 C)  Wt 262 lb (118.842 kg) Gen: NAD, resting comfortably CV: irregularly irregular and bradycardic, no murmurs rubs or gallops Lungs: CTAB no crackles, wheeze, rhonchi Abdomen: soft/nontender/nondistended/normal bowel sounds. No rebound or guarding.  Ext: trace edema Skin: warm, dry, no rash Neuro: grossly normal, moves all extremities, PERRLA DM foot exam normal  Assessment/Plan:  Diabetes Mellitus-diet controlled Diet controlled. Check A1c today. Updated health maintenance and patient to faxNext eye report to  [place].   Atrial fibrillation Well-controlled. In A. fib today. Continue Xarelto for anticoagulation and metoprolol for rate control  HYPERTENSION Well-controlled on amlodipine 5 mg, Diovan 320 mg, and metoprolol 25 mg. Continue   Fasting labs today Orders Placed This Encounter  Procedures  . CBC with Differential  . Lipid panel    [place]    Order Specific Question:  Has the patient fasted?    Answer:  No  . Hemoglobin A1c    [place]  . TSH    [place]  . Hepatic Function Panel

## 2014-07-03 DIAGNOSIS — I4891 Unspecified atrial fibrillation: Secondary | ICD-10-CM | POA: Diagnosis not present

## 2014-07-30 ENCOUNTER — Encounter: Payer: Self-pay | Admitting: Family Medicine

## 2014-07-30 ENCOUNTER — Other Ambulatory Visit: Payer: Self-pay

## 2014-07-30 MED ORDER — POTASSIUM CHLORIDE CRYS ER 20 MEQ PO TBCR: 20.0000 meq | EXTENDED_RELEASE_TABLET | Freq: Every day | ORAL | Status: DC

## 2014-08-02 DIAGNOSIS — H3530 Unspecified macular degeneration: Secondary | ICD-10-CM | POA: Diagnosis not present

## 2014-08-02 DIAGNOSIS — D3131 Benign neoplasm of right choroid: Secondary | ICD-10-CM | POA: Diagnosis not present

## 2014-08-07 ENCOUNTER — Telehealth: Payer: Self-pay | Admitting: Family Medicine

## 2014-08-07 MED ORDER — RIVAROXABAN 20 MG PO TABS: 20.0000 mg | ORAL_TABLET | Freq: Every day | ORAL | Status: DC

## 2014-08-07 NOTE — Telephone Encounter (Signed)
Medication refilled

## 2014-08-07 NOTE — Telephone Encounter (Signed)
[facility] is requesting re-fill on Rivaroxaban (XARELTO) 20 MG TABS tablet

## 2014-08-18 ENCOUNTER — Other Ambulatory Visit: Payer: Self-pay | Admitting: Family Medicine

## 2014-09-19 ENCOUNTER — Other Ambulatory Visit: Payer: Self-pay | Admitting: Family Medicine

## 2014-10-09 ENCOUNTER — Other Ambulatory Visit: Payer: Self-pay | Admitting: Family Medicine

## 2014-10-18 DIAGNOSIS — I480 Paroxysmal atrial fibrillation: Secondary | ICD-10-CM | POA: Diagnosis not present

## 2014-11-30 ENCOUNTER — Encounter: Payer: Self-pay | Admitting: Family Medicine

## 2014-11-30 ENCOUNTER — Telehealth: Payer: Self-pay | Admitting: Family Medicine

## 2014-11-30 ENCOUNTER — Other Ambulatory Visit: Payer: Self-pay

## 2014-11-30 MED ORDER — RIVAROXABAN 20 MG PO TABS: 20.0000 mg | ORAL_TABLET | Freq: Every day | ORAL | Status: DC

## 2014-11-30 NOTE — Telephone Encounter (Signed)
Medication sent in. 

## 2014-11-30 NOTE — Telephone Encounter (Signed)
Pt need new rx xarelto 20 mg #90 w/REFILLS  [facility]. Pt has about 12 day left

## 2014-12-16 ENCOUNTER — Emergency Department (INDEPENDENT_AMBULATORY_CARE_PROVIDER_SITE_OTHER)
Admission: EM | Admit: 2014-12-16 | Discharge: 2014-12-16 | Disposition: A | Discharge status: Home / Self Care | Payer: Medicare Other | Source: Home / Self Care | Attending: Family Medicine | Admitting: Family Medicine

## 2014-12-16 ENCOUNTER — Emergency Department (INDEPENDENT_AMBULATORY_CARE_PROVIDER_SITE_OTHER): Payer: Medicare Other

## 2014-12-16 ENCOUNTER — Encounter (HOSPITAL_COMMUNITY): Payer: Self-pay

## 2014-12-16 DIAGNOSIS — J209 Acute bronchitis, unspecified: Secondary | ICD-10-CM

## 2014-12-16 DIAGNOSIS — R509 Fever, unspecified: Secondary | ICD-10-CM | POA: Diagnosis not present

## 2014-12-16 DIAGNOSIS — R0602 Shortness of breath: Secondary | ICD-10-CM | POA: Diagnosis not present

## 2014-12-16 DIAGNOSIS — R05 Cough: Secondary | ICD-10-CM | POA: Diagnosis not present

## 2014-12-16 LAB — POCT I-STAT, CHEM 8
BUN: 17 mg/dL (ref 6–23)
Calcium, Ion: 1.15 mmol/L (ref 1.13–1.30)
Chloride: 100 mmol/L (ref 96–112)
Creatinine, Ser: 1 mg/dL (ref 0.50–1.35)
GLUCOSE: 92 mg/dL (ref 70–99)
HCT: 43 % (ref 39.0–52.0)
Hemoglobin: 14.6 g/dL (ref 13.0–17.0)
POTASSIUM: 3.8 mmol/L (ref 3.5–5.1)
Sodium: 139 mmol/L (ref 135–145)
TCO2: 25 mmol/L (ref 0–100)

## 2014-12-16 LAB — CBC WITH DIFFERENTIAL/PLATELET
Basophils Absolute: 0 10*3/uL (ref 0.0–0.1)
Basophils Relative: 1 % (ref 0–1)
Eosinophils Absolute: 0.2 10*3/uL (ref 0.0–0.7)
Eosinophils Relative: 4 % (ref 0–5)
HCT: 40.3 % (ref 39.0–52.0)
Hemoglobin: 13.6 g/dL (ref 13.0–17.0)
LYMPHS ABS: 0.6 10*3/uL — AB (ref 0.7–4.0)
Lymphocytes Relative: 15 % (ref 12–46)
MCH: 29.1 pg (ref 26.0–34.0)
MCHC: 33.7 g/dL (ref 30.0–36.0)
MCV: 86.3 fL (ref 78.0–100.0)
Monocytes Absolute: 0.5 10*3/uL (ref 0.1–1.0)
Monocytes Relative: 12 % (ref 3–12)
NEUTROS ABS: 2.5 10*3/uL (ref 1.7–7.7)
Neutrophils Relative %: 68 % (ref 43–77)
PLATELETS: 135 10*3/uL — AB (ref 150–400)
RBC: 4.67 MIL/uL (ref 4.22–5.81)
RDW: 14 % (ref 11.5–15.5)
WBC: 3.7 10*3/uL — AB (ref 4.0–10.5)

## 2014-12-16 LAB — BRAIN NATRIURETIC PEPTIDE: B NATRIURETIC PEPTIDE 5: 30 pg/mL (ref 0.0–100.0)

## 2014-12-16 MED ORDER — PREDNISONE 20 MG PO TABS: 40.0000 mg | ORAL_TABLET | Freq: Every day | ORAL | Status: DC

## 2014-12-16 MED ORDER — DOXYCYCLINE HYCLATE 100 MG PO CAPS: 100.0000 mg | ORAL_CAPSULE | Freq: Two times a day (BID) | ORAL | Status: DC

## 2014-12-16 MED ORDER — ALBUTEROL SULFATE HFA 108 (90 BASE) MCG/ACT IN AERS: 2.0000 | INHALATION_SPRAY | RESPIRATORY_TRACT | Status: DC | PRN

## 2014-12-16 NOTE — ED Notes (Signed)
Patient complains of cough, congestion SOB Head congestion and left ear pain that started about two Days ago.  Patient states he was running a low grade fever as well Has been using OTC medications with little relief

## 2014-12-16 NOTE — ED Provider Notes (Signed)
CSN: [id number]     Arrival date & time 12/16/14  1443 History   First MD Initiated Contact with Patient 12/16/14 [phone]     Chief Complaint  Patient presents with  . Nasal Congestion   (Consider location/radiation/quality/duration/timing/severity/associated sxs/prior Treatment) HPI       81 year old male with history of multiple ablations for arrhythmias, hypertension, diabetes, presents for evaluation of nasal congestion, cough, mild ear pain, shortness of breath on exertion. Symptoms started 2 days ago with a cough and have progressed since that time. He had a temperature of 100F yesterday. No NVD, weakness, or chest pain, leg swelling. No history of heart failure. He is on Xarelto for A. fib.  Past Medical History  Diagnosis Date  . Atrial fibrillation   . Atrial flutter   . HTN (hypertension)   . Other and unspecified hyperlipidemia   . Type II or unspecified type diabetes mellitus without mention of complication, not stated as uncontrolled     Diet control   . Hyperglycemia   . BCC (basal cell carcinoma of skin)     Nose  . Knee problem     2% permanent partial impairment Right  . Gastric ulcer   . Hiatal hernia   . Allergic rhinitis   . Hearing loss     uses amplification  . Diverticulosis   . Erosive esophagitis   . Esophageal stricture   . Arthritis   . Depression   . Sleep apnea   . Hemorrhoids   . DIVERTICULOSIS, COLON 04/13/2007         Past Surgical History  Procedure Laterality Date  . Uvulopalatopharyngoplasty  1980's  . Vasectomy  0867'Y    w/complications  . Cardiac electrophysiology study and ablation      x 4   . Nose surgery     Family History  Problem Relation Age of Onset  . Stroke Mother   . Breast cancer Mother   . Heart disease Father   . Hypertension Father   . Cancer Father     Bladder  . Breast cancer Brother   . Atrial fibrillation Brother   . Tremor Brother   . Stroke Brother   . Colon cancer Neg Hx    History  Substance Use  Topics  . Smoking status: Former Smoker -- 1.00 packs/day for 28 years    Types: Cigarettes    Quit date: 10/12/1977  . Smokeless tobacco: Never Used  . Alcohol Use: Yes     Comment: Occasional    Review of Systems  Constitutional: Positive for fever and chills.  HENT: Positive for congestion, ear pain, rhinorrhea and sinus pressure. Negative for sore throat.   Eyes: Negative for visual disturbance.  Respiratory: Positive for cough, chest tightness and shortness of breath.   Cardiovascular: Negative for chest pain and leg swelling.  Gastrointestinal: Negative for nausea, vomiting, abdominal pain and diarrhea.  All other systems reviewed and are negative.   Allergies  Review of patient's allergies indicates no known allergies.  Home Medications   Prior to Admission medications   Medication Sig Start Date End Date Taking? Authorizing Provider  albuterol (PROVENTIL HFA;VENTOLIN HFA) 108 (90 BASE) MCG/ACT inhaler Inhale 2 puffs into the lungs every 4 (four) hours as needed for wheezing. 12/16/14   Liam Graham, PA-C  amLODipine (NORVASC) 5 MG tablet TAKE 1 TABLET DAILY 08/18/14   Marin Olp, MD  DIOVAN 320 MG tablet TAKE 1 TABLET DAILY 09/19/14   Marin Olp, MD  doxycycline (VIBRAMYCIN) 100 MG capsule Take 1 capsule (100 mg total) by mouth 2 (two) times daily. 12/16/14   Liam Graham, PA-C  KLOR-CON M20 20 MEQ tablet TAKE 1 TABLET DAILY 10/09/14   Marin Olp, MD  loratadine (CLARITIN) 10 MG tablet Take 10 mg by mouth daily.    Historical Provider, MD  MAGNESIUM GLUCONATE PO Take by mouth daily.      Historical Provider, MD  Methylcellulose, Laxative, (CITRUCEL PO) Take by mouth as needed.      Historical Provider, MD  metoprolol succinate (TOPROL-XL) 25 MG 24 hr tablet Take 25 mg by mouth daily.    Historical Provider, MD  NEXIUM 40 MG capsule TAKE 1 CAPSULE EVERY MORNING 08/18/14   Marin Olp, MD  pravastatin (PRAVACHOL) 40 MG tablet TAKE 1 TABLET AT BEDTIME  08/18/14   Marin Olp, MD  predniSONE (DELTASONE) 20 MG tablet Take 2 tablets (40 mg total) by mouth daily with breakfast. 12/16/14   Liam Graham, PA-C  rivaroxaban (XARELTO) 20 MG TABS tablet Take 1 tablet (20 mg total) by mouth daily. Take 20 mg by mouth daily., Disp-90 tablet, R-3 11/30/14   Marin Olp, MD   BP 152/84 mmHg  Pulse 68  Temp(Src) 98.8 F (37.1 C) (Oral)  Resp 20  SpO2 95% Physical Exam  Constitutional: He is oriented to person, place, and time. He appears well-developed and well-nourished. No distress.  HENT:  Head: Normocephalic and atraumatic.  Right Ear: External ear normal.  Left Ear: External ear normal.  Nose: Nose normal.  Mouth/Throat: Oropharynx is clear and moist. No oropharyngeal exudate.  Eyes: Conjunctivae are normal.  Neck: Normal range of motion. Neck supple. No JVD present. No tracheal deviation present.  Cardiovascular: Normal rate, regular rhythm and normal heart sounds.   No lower extremity edema  Pulmonary/Chest: Effort normal. No respiratory distress. He has no wheezes. He has rales (LLL).  Lymphadenopathy:    He has no cervical adenopathy.  Neurological: He is alert and oriented to person, place, and time. Coordination normal.  Skin: Skin is warm and dry. No rash noted. He is not diaphoretic.  Psychiatric: He has a normal mood and affect. Judgment normal.  Nursing note and vitals reviewed.   ED Course  ED EKG  Date/Time: 12/16/2014 10:51 AM Performed by: Allena Katz, H Authorized by: Allena Katz, H Rhythm: sinus bradycardia Rate: normal QRS axis: normal Conduction: conduction normal ST Segments: ST segments normal Other: no other findings Clinical impression: normal ECG   (including critical care time) Labs Review Labs Reviewed  CBC WITH DIFFERENTIAL/PLATELET - Abnormal; Notable for the following:    WBC 3.7 (*)    Platelets 135 (*)    Lymphs Abs 0.6 (*)    All other components within normal limits  BRAIN  NATRIURETIC PEPTIDE  POCT I-STAT, CHEM 8    Imaging Review Dg Chest 2 View  12/16/2014   CLINICAL DATA:  81 year old male with a history of cough and fever for 2 days. Shortness of breath.  EXAM: CHEST - 2 VIEW  COMPARISON:  Chest x-ray 11/23/2007, CT chest 12/20/2007  FINDINGS: Cardiomediastinal silhouette within normal limits in size and contour. No evidence of pulmonary vascular congestion.  No confluent airspace disease.  No pneumothorax or pleural effusion.  No displaced fracture.  Flowing anterior osteophyte production along they visualized thoracic spine. Questionable syndesmophyte production.  IMPRESSION: No radiographic evidence of acute cardiopulmonary disease.  Flowing anterior osteophyte production of the thoracic spine, with questionable  syndesmophyte production. Prior CT of the abdomen demonstrates bridging osteophyte formation of the bilateral sacroiliac joints. Findings may be seen in various rheumatologic conditions, and if the patient has not yet been evaluated, correlation with serology and rheumatologic evaluation may be useful.  Signed,  Dulcy Fanny. Earleen Newport, DO  [facility]  [facility]  Signed,  Dulcy Fanny. Earleen Newport, DO  [facility]  [facility]   Electronically Signed   By: Corrie Mckusick D.O.   On: 12/16/2014 11:24     MDM   1. Acute bronchitis, unspecified organism    Chest x-ray read as normal, left lower lobe atelectasis not addressed by radiologist. Carmon Ginsberg is normal, as is the BNP. He is afebrile and nontoxic with no increased work of breathing and normal vital signs. His EKG is unremarkable. Treat for acute bronchitis with doxycycline, 3 days of prednisone, and an albuterol inhaler. Follow-up if any worsening for recheck  Meds ordered this encounter  Medications  . DISCONTD: doxycycline (VIBRAMYCIN) 100 MG capsule    Sig: Take 1 capsule (100 mg total) by mouth 2 (two) times daily.     Dispense:  14 capsule    Refill:  0    Order Specific Question:  Supervising Provider    Answer:  Billy Fischer [phone]  . DISCONTD: predniSONE (DELTASONE) 20 MG tablet    Sig: Take 2 tablets (40 mg total) by mouth daily with breakfast.    Dispense:  6 tablet    Refill:  0    Order Specific Question:  Supervising Provider    Answer:  Billy Fischer [phone]  . DISCONTD: albuterol (PROVENTIL HFA;VENTOLIN HFA) 108 (90 BASE) MCG/ACT inhaler    Sig: Inhale 2 puffs into the lungs every 4 (four) hours as needed for wheezing.    Dispense:  1 Inhaler    Refill:  0    Order Specific Question:  Supervising Provider    Answer:  Ihor Gully D [id number]  . predniSONE (DELTASONE) 20 MG tablet    Sig: Take 2 tablets (40 mg total) by mouth daily with breakfast.    Dispense:  6 tablet    Refill:  0    Order Specific Question:  Supervising Provider    Answer:  Billy Fischer [phone]  . doxycycline (VIBRAMYCIN) 100 MG capsule    Sig: Take 1 capsule (100 mg total) by mouth 2 (two) times daily.    Dispense:  14 capsule    Refill:  0    Order Specific Question:  Supervising Provider    Answer:  Billy Fischer [phone]  . albuterol (PROVENTIL HFA;VENTOLIN HFA) 108 (90 BASE) MCG/ACT inhaler    Sig: Inhale 2 puffs into the lungs every 4 (four) hours as needed for wheezing.    Dispense:  1 Inhaler    Refill:  0    Order Specific Question:  Supervising Provider    Answer:  Ihor Gully D [place], PA-C 12/16/14 1204

## 2014-12-16 NOTE — Discharge Instructions (Signed)

## 2014-12-18 ENCOUNTER — Other Ambulatory Visit: Payer: Self-pay | Admitting: Family Medicine

## 2015-01-08 ENCOUNTER — Telehealth: Payer: Self-pay | Admitting: Family Medicine

## 2015-01-08 ENCOUNTER — Other Ambulatory Visit: Payer: Self-pay | Admitting: Family Medicine

## 2015-01-08 MED ORDER — METOPROLOL SUCCINATE ER 25 MG PO TB24: 25.0000 mg | ORAL_TABLET | Freq: Every day | ORAL | Status: DC

## 2015-01-08 NOTE — Telephone Encounter (Signed)
Medication sent to [facility]

## 2015-01-08 NOTE — Telephone Encounter (Signed)
Refill request for Metoprolol ER 25 mg and send to [facility].

## 2015-01-17 ENCOUNTER — Telehealth: Payer: Self-pay | Admitting: Family Medicine

## 2015-01-17 ENCOUNTER — Encounter: Payer: Self-pay | Admitting: Family Medicine

## 2015-01-17 ENCOUNTER — Ambulatory Visit (INDEPENDENT_AMBULATORY_CARE_PROVIDER_SITE_OTHER): Payer: Medicare Other | Admitting: Family Medicine

## 2015-01-17 VITALS — BP 118/60 | HR 67 | Temp 98.1°F | Wt 248.0 lb

## 2015-01-17 DIAGNOSIS — Z23 Encounter for immunization: Secondary | ICD-10-CM

## 2015-01-17 DIAGNOSIS — I4891 Unspecified atrial fibrillation: Secondary | ICD-10-CM

## 2015-01-17 NOTE — Telephone Encounter (Signed)
Printed and given to Dr. Yong Channel

## 2015-01-17 NOTE — Assessment & Plan Note (Signed)
Has gone back into atrial fibrillation by EKG. He is going to get into see his cardiologist for consideration of cardioversion. Continue xarelo and metoprolol

## 2015-01-17 NOTE — Telephone Encounter (Signed)
Patient is scheduled at 10:45 to see Dr. Yong Channel.  He states he need an EKG due to his heart feeling out of rhythm: slow to now fast.  He says Dr. Yong Channel is aware of this issue and has always been advise to have an EKG done and results relayed to his cardiologist.

## 2015-01-17 NOTE — Progress Notes (Signed)
Garret Reddish, MD Phone: [phone]  Subjective:   Guy Moore is a 81 y.o. year old very pleasant male patient who presents with the following:  Paroxysmal atrial fibrillation. In a fib today. Rate controlled and anticoagulated.  -started Sunday or Monday started having some palpitations feeling like extra beats. Pulse was up and normally in low 50s. Yesterday seemed to have even more palpitations and blood pressure was around 90/60. Admits to losing weight over the winter. Has failed attempts at medications to get patient back in rhythm. Preference is for cardioversion. Last cardioversion about a year ago. Compliant with xarelto and metoprolol. Some fatigue going up stairs which is new.   Dr. Lenard Galloway of [facility] EP.   ROS- no chest pain, mild shortness of breath with stairs, no fevers/chills/cough/abdominal pain.   Past Medical History- Patient Active Problem List   Diagnosis Date Noted  . Atrial fibrillation 04/19/2009    Priority: High  . Diabetes Mellitus-diet controlled 10/21/2007    Priority: High  . OBSTRUCTIVE SLEEP APNEA 07/04/2009    Priority: Medium  . HYPERLIPIDEMIA 04/13/2007    Priority: Medium  . HYPERTENSION 04/13/2007    Priority: Medium  . History of basal cell cancer 06/27/2014    Priority: Low  . Shingles 03/24/2014    Priority: Low  . Bleeding gums 04/15/2013    Priority: Low  . GERD (gastroesophageal reflux disease) 08/31/2011    Priority: Low  . Anticoagulant long-term use 08/31/2011    Priority: Low  . ALLERGIC RHINITIS 04/14/2007    Priority: Low  . Routine health maintenance 03/11/2012   Medications- reviewed and updated Current Outpatient Prescriptions  Medication Sig Dispense Refill  . amLODipine (NORVASC) 5 MG tablet TAKE 1 TABLET DAILY 90 tablet 1  . DIOVAN 320 MG tablet TAKE 1 TABLET DAILY 90 tablet 1  . KLOR-CON M20 20 MEQ tablet TAKE 1 TABLET DAILY 90 tablet 1  . loratadine (CLARITIN) 10 MG tablet Take 10 mg by mouth  daily.    Marland Kitchen MAGNESIUM GLUCONATE PO Take by mouth daily.      . Methylcellulose, Laxative, (CITRUCEL PO) Take by mouth as needed.      . metoprolol succinate (TOPROL-XL) 25 MG 24 hr tablet Take 1 tablet (25 mg total) by mouth daily. 90 tablet 3  . NEXIUM 40 MG capsule TAKE 1 CAPSULE EVERY MORNING 90 capsule 1  . pravastatin (PRAVACHOL) 40 MG tablet TAKE 1 TABLET AT BEDTIME 90 tablet 1  . rivaroxaban (XARELTO) 20 MG TABS tablet Take 1 tablet (20 mg total) by mouth daily. Take 20 mg by mouth daily., Disp-90 tablet, R-3 90 tablet 3  . albuterol (PROVENTIL HFA;VENTOLIN HFA) 108 (90 BASE) MCG/ACT inhaler Inhale 2 puffs into the lungs every 4 (four) hours as needed for wheezing. (Patient not taking: Reported on 01/17/2015) 1 Inhaler 0   Objective: BP 118/60 mmHg  Pulse 67  Temp(Src) 98.1 F (36.7 C)  Wt 248 lb (112.492 kg) Gen: NAD, resting comfortably CV: irregularly irregular, no murmurs rubs or gallops Lungs: CTAB no crackles, wheeze, rhonchi Abdomen: soft/nontender/nondistended/normal bowel sounds.  Ext: no edema Skin: warm, dry, no rash Neuro: grossly normal, moves all extremities  EKG: atrial fibrillation with rate 81, nml axis, qt, qrs interval. No obvious hypertrophy or ischemic changes.   Assessment/Plan:  Atrial fibrillation Has gone back into atrial fibrillation by EKG. He is going to get into see his cardiologist for consideration of cardioversion. Continue xarelo and metoprolol   Return precautions advised.   Orders  Placed This Encounter  Procedures  . Pneumococcal conjugate vaccine 13-valent  . EKG 12-Lead

## 2015-01-17 NOTE — Patient Instructions (Addendum)
You are back in atrial fibrillation  You are going to contact Dr. Lenard Galloway and get in for evaluation.

## 2015-01-18 DIAGNOSIS — I4891 Unspecified atrial fibrillation: Secondary | ICD-10-CM | POA: Diagnosis not present

## 2015-01-18 DIAGNOSIS — E785 Hyperlipidemia, unspecified: Secondary | ICD-10-CM | POA: Diagnosis not present

## 2015-01-18 DIAGNOSIS — Z7901 Long term (current) use of anticoagulants: Secondary | ICD-10-CM | POA: Diagnosis not present

## 2015-01-18 DIAGNOSIS — Z8249 Family history of ischemic heart disease and other diseases of the circulatory system: Secondary | ICD-10-CM | POA: Diagnosis not present

## 2015-01-18 DIAGNOSIS — R5381 Other malaise: Secondary | ICD-10-CM | POA: Diagnosis not present

## 2015-01-18 DIAGNOSIS — R5383 Other fatigue: Secondary | ICD-10-CM | POA: Diagnosis not present

## 2015-01-18 DIAGNOSIS — I480 Paroxysmal atrial fibrillation: Secondary | ICD-10-CM | POA: Diagnosis not present

## 2015-01-18 DIAGNOSIS — G4730 Sleep apnea, unspecified: Secondary | ICD-10-CM | POA: Diagnosis not present

## 2015-01-18 DIAGNOSIS — Z87891 Personal history of nicotine dependence: Secondary | ICD-10-CM | POA: Diagnosis not present

## 2015-01-18 DIAGNOSIS — I10 Essential (primary) hypertension: Secondary | ICD-10-CM | POA: Diagnosis not present

## 2015-01-18 DIAGNOSIS — I481 Persistent atrial fibrillation: Secondary | ICD-10-CM | POA: Diagnosis not present

## 2015-01-18 DIAGNOSIS — R0602 Shortness of breath: Secondary | ICD-10-CM | POA: Diagnosis not present

## 2015-01-18 DIAGNOSIS — Z79899 Other long term (current) drug therapy: Secondary | ICD-10-CM | POA: Diagnosis not present

## 2015-01-18 DIAGNOSIS — R0609 Other forms of dyspnea: Secondary | ICD-10-CM | POA: Diagnosis not present

## 2015-01-18 DIAGNOSIS — E669 Obesity, unspecified: Secondary | ICD-10-CM | POA: Diagnosis not present

## 2015-01-18 DIAGNOSIS — K219 Gastro-esophageal reflux disease without esophagitis: Secondary | ICD-10-CM | POA: Diagnosis not present

## 2015-01-18 LAB — CBC AND DIFFERENTIAL
HEMATOCRIT: 43 % (ref 41–53)
HEMOGLOBIN: 14.5 g/dL (ref 13.5–17.5)
Platelets: 164 10*3/uL (ref 150–399)
WBC: 3.4 10*3/mL

## 2015-01-22 ENCOUNTER — Encounter: Payer: Self-pay | Admitting: Family Medicine

## 2015-02-28 ENCOUNTER — Other Ambulatory Visit: Payer: Self-pay | Admitting: Family Medicine

## 2015-04-11 DIAGNOSIS — I10 Essential (primary) hypertension: Secondary | ICD-10-CM | POA: Diagnosis not present

## 2015-04-11 DIAGNOSIS — I480 Paroxysmal atrial fibrillation: Secondary | ICD-10-CM | POA: Diagnosis not present

## 2015-04-12 ENCOUNTER — Ambulatory Visit (INDEPENDENT_AMBULATORY_CARE_PROVIDER_SITE_OTHER): Payer: Medicare Other | Admitting: Family Medicine

## 2015-04-12 ENCOUNTER — Encounter: Payer: Self-pay | Admitting: Family Medicine

## 2015-04-12 VITALS — BP 140/70 | HR 58 | Temp 98.2°F | Ht 67.0 in | Wt 250.0 lb

## 2015-04-12 DIAGNOSIS — Z87891 Personal history of nicotine dependence: Secondary | ICD-10-CM | POA: Diagnosis not present

## 2015-04-12 DIAGNOSIS — K219 Gastro-esophageal reflux disease without esophagitis: Secondary | ICD-10-CM

## 2015-04-12 DIAGNOSIS — E119 Type 2 diabetes mellitus without complications: Secondary | ICD-10-CM | POA: Diagnosis not present

## 2015-04-12 DIAGNOSIS — Z0000 Encounter for general adult medical examination without abnormal findings: Secondary | ICD-10-CM

## 2015-04-12 DIAGNOSIS — E785 Hyperlipidemia, unspecified: Secondary | ICD-10-CM | POA: Diagnosis not present

## 2015-04-12 DIAGNOSIS — I10 Essential (primary) hypertension: Secondary | ICD-10-CM

## 2015-04-12 DIAGNOSIS — D696 Thrombocytopenia, unspecified: Secondary | ICD-10-CM

## 2015-04-12 LAB — POCT URINALYSIS DIPSTICK
Bilirubin, UA: NEGATIVE
GLUCOSE UA: NEGATIVE
Ketones, UA: NEGATIVE
Leukocytes, UA: NEGATIVE
NITRITE UA: NEGATIVE
PROTEIN UA: NEGATIVE
Spec Grav, UA: 1.02
Urobilinogen, UA: 0.2
pH, UA: 5.5

## 2015-04-12 LAB — COMPREHENSIVE METABOLIC PANEL
ALBUMIN: 4.2 g/dL (ref 3.5–5.2)
ALT: 27 U/L (ref 0–53)
AST: 21 U/L (ref 0–37)
Alkaline Phosphatase: 40 U/L (ref 39–117)
BILIRUBIN TOTAL: 0.8 mg/dL (ref 0.2–1.2)
BUN: 16 mg/dL (ref 6–23)
CALCIUM: 9.5 mg/dL (ref 8.4–10.5)
CHLORIDE: 103 meq/L (ref 96–112)
CO2: 32 mEq/L (ref 19–32)
CREATININE: 1.05 mg/dL (ref 0.40–1.50)
GFR: 72.35 mL/min (ref >60.00)
Glucose, Bld: 96 mg/dL (ref 70–99)
Potassium: 4.3 mEq/L (ref 3.5–5.1)
SODIUM: 140 meq/L (ref 135–145)
Total Protein: 7.1 g/dL (ref 6.0–8.3)

## 2015-04-12 LAB — CBC
HCT: 41.4 % (ref 39.0–52.0)
Hemoglobin: 13.6 g/dL (ref 13.0–17.0)
MCHC: 32.8 g/dL (ref 30.0–36.0)
MCV: 87.2 fl (ref 78.0–100.0)
Platelets: 145 10*3/uL — ABNORMAL LOW (ref 150.0–400.0)
RBC: 4.75 Mil/uL (ref 4.22–5.81)
RDW: 15.6 % — ABNORMAL HIGH (ref 11.5–15.5)
WBC: 3.7 10*3/uL — AB (ref 4.0–10.5)

## 2015-04-12 LAB — HEMOGLOBIN A1C: HEMOGLOBIN A1C: 6.1 % (ref 4.6–6.5)

## 2015-04-12 LAB — URINALYSIS, MICROSCOPIC ONLY: WBC, UA: NONE SEEN (ref 0–?)

## 2015-04-12 LAB — LDL CHOLESTEROL, DIRECT: Direct LDL: 51 mg/dL

## 2015-04-12 NOTE — Assessment & Plan Note (Signed)
S: platelets tend to range 130-170. Mildly low today but stable in range A/P: continue to monitor, also has slightly low WBC. IF any worsening, consider further workup

## 2015-04-12 NOTE — Assessment & Plan Note (Signed)
S:10-15 years ago placed on PPI, diet drinks, coffee before. Off of nexium has not had recurrence of symptoms.  A/P:Trial off nexium scheduled and change to prn.

## 2015-04-12 NOTE — Patient Instructions (Addendum)
  Guy Moore , Thank you for taking time to come for your Medicare Wellness Visit. I appreciate your ongoing commitment to your health goals. Please review the following plan we discussed and let me know if I can assist you in the future.   These are the goals we discussed: 1. Schedule a dermatology follow up. i want them to look at the top of your head (keep using lotion) and spot on your chest 2. Stop nexium and use just as needed 3. Labs before you leave 4. See me within 3 months for a blood pressure recheck given recent check. Bring a log of your blood pressures-check at least once a week. As long as your home cuff so we can check it.  5. Keep up the great job walking!    This is a list of the screening recommended for you and due dates:  Health Maintenance  Topic Date Due  . Hemoglobin A1C  12/26/2014  . Eye exam for diabetics  01/17/2016*  . Flu Shot  05/13/2015  . Complete foot exam   06/28/2015  . Tetanus Vaccine  10/10/2015  . Colon Cancer Screening  12/06/2021  . Shingles Vaccine  Completed  . Pneumonia vaccines  Completed  *Topic was postponed. The date shown is not the original due date.

## 2015-04-12 NOTE — Assessment & Plan Note (Signed)
S: controlled on pravastatin Lab Results  Component Value Date   CHOL 118 06/27/2014   HDL 52.60 06/27/2014   LDLCALC 61 06/27/2014   LDLDIRECT 51.0 04/12/2015   TRIG 24.0 06/27/2014   CHOLHDL 2 06/27/2014  A/P: continue current rx

## 2015-04-12 NOTE — Assessment & Plan Note (Signed)
S: Walk 3x a week for 60 mins. Holding BMI just below 40. Reasonable diet.  Lab Results  Component Value Date   HGBA1C 6.1 04/12/2015  diet controlled A/P: continue without rx, continue exercise.

## 2015-04-12 NOTE — Assessment & Plan Note (Signed)
S:lightheaded at times and with home readings as low as106/60. Cardiology decreased valsartan to 160mg  yesterday but continued amlodipine 5mg  and metorpolol XL. Poor control in office A/P: High here. Home cuff not verified but readings controlled there. Continue amlodipine 5 mg, Diovan 320 mg-->160mg  per cards, and metoprolol 25 mg extended release. Monitor home readings at least once a week and bring cuff in for verification next visit along with readings

## 2015-04-12 NOTE — Progress Notes (Signed)
Guy Reddish, MD Phone: [phone]  Subjective:  Patient presents today for their annual wellness visit.    See problem oriented charting ROS- denies hypoglycemia, blurry vision, chest pain, shortness of breath  Preventive Screening-Counseling & Management  Smoking Status: former Smoker, outside range for screenings other than urine Second Hand Smoking status: No smokers in home  Risk Factors Regular exercise: walks 3x a week for 60 mins Fall Risk: None   Cardiac risk factors:  advanced age (older than 44 for men, 93 for women)  Hyperlipidemia - controlled on pravastatin Lab Results  Component Value Date   CHOL 118 06/27/2014   HDL 52.60 06/27/2014   LDLCALC 61 06/27/2014   LDLDIRECT 51.0 04/12/2015   TRIG 24.0 06/27/2014   CHOLHDL 2 06/27/2014  No diabetes. Controlled with exercise Lab Results  Component Value Date   HGBA1C 6.1 04/12/2015  Family History: father with heart disesae   Depression Screen None. PHQ2 0   Activities of Daily Living Independent ADLs and IADLs   Hearing Difficulties: -patient wears hearing aids  Cognitive Testing No reported trouble.    Normal 3 word recall  List the Names of Other Physician/Practitioners you currently use: 1.Dr. Susa Day EP seen for cardioversion in April, last seen 04/11/15 2. Dr. Gwenette Greet pulmonology, soon to be Dr. Halford Chessman 2. Optho Dr. Alois Cliche, wears glasses  Immunization History  Administered Date(s) Administered  . Influenza Whole 08/12/2006, 10/21/2007, 07/10/2008, 07/19/2009, 07/12/2012  . Influenza, Seasonal, Injecte, Preservative Fre 08/04/2013  . Pneumococcal Conjugate-13 01/17/2015  . Pneumococcal Polysaccharide-23 03/14/2013  . Td 10/09/2005  . Zoster 03/19/2009   Required Immunizations needed today none  Screening tests- up to date, other than needing a1c today There are no preventive care reminders to display for this patient.  ROS- No pertinent positives discovered in course  of AWV other than hearing loss  The following were reviewed and entered/updated in epic: Past Medical History  Diagnosis Date  . Atrial fibrillation   . Atrial flutter   . HTN (hypertension)   . Other and unspecified hyperlipidemia   . Type II or unspecified type diabetes mellitus without mention of complication, not stated as uncontrolled     Diet control   . Hyperglycemia   . BCC (basal cell carcinoma of skin)     Nose  . Knee problem     2% permanent partial impairment Right  . Gastric ulcer   . Hiatal hernia   . Allergic rhinitis   . Hearing loss     uses amplification  . Diverticulosis   . Erosive esophagitis   . Esophageal stricture   . Arthritis   . Depression   . Sleep apnea   . Hemorrhoids   . DIVERTICULOSIS, COLON 04/13/2007         Patient Active Problem List   Diagnosis Date Noted  . Atrial fibrillation 04/19/2009    Priority: High  . DM II- diet controlled 10/21/2007    Priority: High  . Thrombocytopenia 04/12/2015    Priority: Medium  . OBSTRUCTIVE SLEEP APNEA 07/04/2009    Priority: Medium  . Hyperlipidemia 04/13/2007    Priority: Medium  . Essential hypertension 04/13/2007    Priority: Medium  . Former smoker 04/12/2015    Priority: Low  . History of basal cell cancer 06/27/2014    Priority: Low  . Shingles 03/24/2014    Priority: Low  . Bleeding gums 04/15/2013    Priority: Low  . GERD (gastroesophageal reflux disease) 08/31/2011    Priority: Low  .  Anticoagulant long-term use 08/31/2011    Priority: Low  . ALLERGIC RHINITIS 04/14/2007    Priority: Low  . Routine health maintenance 03/11/2012   Past Surgical History  Procedure Laterality Date  . Uvulopalatopharyngoplasty  1980's  . Vasectomy  3267'T    w/complications  . Cardiac electrophysiology study and ablation      x 4   . Nose surgery      Family History  Problem Relation Age of Onset  . Stroke Mother   . Breast cancer Mother   . Heart disease Father   . Hypertension  Father   . Cancer Father     Bladder  . Breast cancer Brother   . Atrial fibrillation Brother   . Tremor Brother   . Stroke Brother   . Colon cancer Neg Hx     Medications- reviewed and updated Current Outpatient Prescriptions  Medication Sig Dispense Refill  . albuterol (PROVENTIL HFA;VENTOLIN HFA) 108 (90 BASE) MCG/ACT inhaler Inhale 2 puffs into the lungs every 4 (four) hours as needed for wheezing. 1 Inhaler 0  . amLODipine (NORVASC) 5 MG tablet TAKE 1 TABLET DAILY 90 tablet 1  . KLOR-CON M20 20 MEQ tablet TAKE 1 TABLET DAILY 90 tablet 1  . loratadine (CLARITIN) 10 MG tablet Take 10 mg by mouth daily.    Marland Kitchen MAGNESIUM GLUCONATE PO Take by mouth daily.      . Methylcellulose, Laxative, (CITRUCEL PO) Take by mouth as needed.      . metoprolol succinate (TOPROL-XL) 25 MG 24 hr tablet Take 1 tablet (25 mg total) by mouth daily. 90 tablet 3  . NEXIUM 40 MG capsule TAKE 1 CAPSULE EVERY MORNING 90 capsule 1  . pravastatin (PRAVACHOL) 40 MG tablet TAKE 1 TABLET AT BEDTIME 90 tablet 1  . rivaroxaban (XARELTO) 20 MG TABS tablet Take 1 tablet (20 mg total) by mouth daily. Take 20 mg by mouth daily., Disp-90 tablet, R-3 90 tablet 3  . valsartan (DIOVAN) 320 MG tablet TAKE 1 TABLET DAILY (Patient taking differently: take 1/2 tab daily) 90 tablet 2   Allergies-reviewed and updated No Known Allergies  History   Social History  . Marital Status: Married    Spouse Name: N/A  . Number of Children: N/A  . Years of Education: N/A   Occupational History  . Retired at 41Print production planner   . 67 years [employer]   . Med Environmental education officer   . 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 Smoking status: Former Smoker -- 1.00 packs/day for 28 years    Types: Cigarettes    Quit date: 10/12/1977  . Smokeless tobacco: Never Used  . Alcohol Use: Yes     Comment: Occasional  . Drug Use: No  . Sexual Activity: Not on file   Other Topics Concern  . None   Social History  Narrative   END OF LIFE CARE: he does not want CPR, short term mechanical ventilation. He does not want heroic or futile care especially if he cannon communicate, read and enjoy his loved ones.      Objective: BP 140/70 mmHg  Pulse 58  Temp(Src) 98.2 F (36.8 C)  Ht 5\' 7"  (1.702 m)  Wt 250 lb (113.399 kg)  BMI 39.15 kg/m2 Gen: NAD, resting comfortably HEENT: Mucous membranes are moist. Oropharynx normal Neck: no thyromegaly CV: RRR no murmurs rubs or gallops Lungs: CTAB no crackles, wheeze, rhonchi Abdomen: soft/nontender/nondistended/normal bowel sounds. No rebound or guarding.  Ext: trace edema Skin: warm, dry Neuro: grossly normal, moves all extremities, PERRLA   Assessment/Plan:  Essential hypertension S:lightheaded at times and with home readings as low as106/60. Cardiology decreased valsartan to 160mg  yesterday but continued amlodipine 5mg  and metorpolol XL. Poor control in office A/P: High here. Home cuff not verified but readings controlled there. Continue amlodipine 5 mg, Diovan 320 mg-->160mg  per cards, and metoprolol 25 mg extended release. Monitor home readings at least once a week and bring cuff in for verification next visit along with readings   DM II- diet controlled S: Walk 3x a week for 60 mins. Holding BMI just below 40. Reasonable diet.  Lab Results  Component Value Date   HGBA1C 6.1 04/12/2015  diet controlled A/P: continue without rx, continue exercise.    GERD (gastroesophageal reflux disease) S:10-15 years ago placed on PPI, diet drinks, coffee before. Off of nexium has not had recurrence of symptoms.  A/P:Trial off nexium scheduled and change to prn.     Hyperlipidemia S: controlled on pravastatin Lab Results  Component Value Date   CHOL 118 06/27/2014   HDL 52.60 06/27/2014   LDLCALC 61 06/27/2014   LDLDIRECT 51.0 04/12/2015   TRIG 24.0 06/27/2014   CHOLHDL 2 06/27/2014  A/P: continue current rx   Thrombocytopenia S: platelets tend  to range 130-170. Mildly low today but stable in range A/P: continue to monitor, also has slightly low WBC. IF any worsening, consider further workup    Return precautions advised.   Orders Placed This Encounter  Procedures  . Hemoglobin A1c  . CBC    [place]  . Comprehensive metabolic panel    [place]  . LDL cholesterol, direct    [place]  . Urine Microscopic  . POCT urinalysis dipstick    Meds ordered this encounter  Medications  . valsartan (DIOVAN) 160 MG tablet    Sig: Take 160 mg by mouth daily.

## 2015-05-07 DIAGNOSIS — L570 Actinic keratosis: Secondary | ICD-10-CM | POA: Diagnosis not present

## 2015-05-07 DIAGNOSIS — L821 Other seborrheic keratosis: Secondary | ICD-10-CM | POA: Diagnosis not present

## 2015-05-07 DIAGNOSIS — B079 Viral wart, unspecified: Secondary | ICD-10-CM | POA: Diagnosis not present

## 2015-05-07 DIAGNOSIS — D225 Melanocytic nevi of trunk: Secondary | ICD-10-CM | POA: Diagnosis not present

## 2015-05-13 ENCOUNTER — Ambulatory Visit (INDEPENDENT_AMBULATORY_CARE_PROVIDER_SITE_OTHER): Payer: Medicare Other | Admitting: Adult Health

## 2015-05-13 ENCOUNTER — Encounter: Payer: Self-pay | Admitting: Adult Health

## 2015-05-13 ENCOUNTER — Telehealth: Payer: Self-pay | Admitting: Family Medicine

## 2015-05-13 VITALS — BP 130/80 | Temp 98.1°F | Ht 67.0 in | Wt 248.4 lb

## 2015-05-13 DIAGNOSIS — I4891 Unspecified atrial fibrillation: Secondary | ICD-10-CM | POA: Diagnosis not present

## 2015-05-13 NOTE — Progress Notes (Signed)
Subjective:    Patient ID: Guy Moore, male    DOB: [birth year], 81 y.o.   MRN: [id number]  HPI   Paroxysmal atrial fibrillation. In a fib today. Rate controlled and anticoagulated.  Last cardioversion about a year ago. Compliant with xarelto and metoprolol. His last cardioversion was in April 2016. He has a history of 4 ablations. This weekend he noticed that his pulse was higher than normal, he usually runs in the 50's and when he checked it at the drug store this weekend it was 88. His blood pressure at this time was 89/60.  Some fatigue/SOB going up stairs.   Dr. Lenard Galloway of [facility] EP.    Review of Systems  Respiratory: Positive for shortness of breath (mild). Negative for chest tightness.   Cardiovascular: Positive for palpitations. Negative for chest pain and leg swelling.  Neurological: Negative for dizziness, syncope and light-headedness.       Past Medical History  Diagnosis Date  . Atrial fibrillation   . Atrial flutter   . HTN (hypertension)   . Other and unspecified hyperlipidemia   . Type II or unspecified type diabetes mellitus without mention of complication, not stated as uncontrolled     Diet control   . Hyperglycemia   . BCC (basal cell carcinoma of skin)     Nose  . Knee problem     2% permanent partial impairment Right  . Gastric ulcer   . Hiatal hernia   . Allergic rhinitis   . Hearing loss     uses amplification  . Diverticulosis   . Erosive esophagitis   . Esophageal stricture   . Arthritis   . Depression   . Sleep apnea   . Hemorrhoids   . DIVERTICULOSIS, COLON 04/13/2007          History   Social History  . Marital Status: Married    Spouse Name: N/A  . Number of Children: N/A  . Years of Education: N/A   Occupational History  . Retired at 55Print production planner   . 44 years [employer]   . Med Environmental education officer   . 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 Smoking status: Former Smoker -- 1.00  packs/day for 28 years    Types: Cigarettes    Quit date: 10/12/1977  . Smokeless tobacco: Never Used  . Alcohol Use: Yes     Comment: Occasional  . Drug Use: No  . Sexual Activity: Not on file   Other Topics Concern  . Not on file   Social History Narrative   END OF LIFE CARE: he does not want CPR, short term mechanical ventilation. He does not want heroic or futile care especially if he cannon communicate, read and enjoy his loved ones.      Past Surgical History  Procedure Laterality Date  . Uvulopalatopharyngoplasty  1980's  . Vasectomy  3846'K    w/complications  . Cardiac electrophysiology study and ablation      x 4   . Nose surgery      Family History  Problem Relation Age of Onset  . Stroke Mother   . Breast cancer Mother   . Heart disease Father   . Hypertension Father   . Cancer Father     Bladder  . Breast cancer Brother   . Atrial fibrillation Brother   . Tremor Brother   . Stroke Brother   . Colon cancer Neg Hx  No Known Allergies  Current Outpatient Prescriptions on File Prior to Visit  Medication Sig Dispense Refill  . amLODipine (NORVASC) 5 MG tablet TAKE 1 TABLET DAILY 90 tablet 1  . KLOR-CON M20 20 MEQ tablet TAKE 1 TABLET DAILY 90 tablet 1  . loratadine (CLARITIN) 10 MG tablet Take 10 mg by mouth daily.    Marland Kitchen MAGNESIUM GLUCONATE PO Take by mouth daily.      . Methylcellulose, Laxative, (CITRUCEL PO) Take by mouth as needed.      . metoprolol succinate (TOPROL-XL) 25 MG 24 hr tablet Take 1 tablet (25 mg total) by mouth daily. 90 tablet 3  . NEXIUM 40 MG capsule TAKE 1 CAPSULE EVERY MORNING 90 capsule 1  . pravastatin (PRAVACHOL) 40 MG tablet TAKE 1 TABLET AT BEDTIME 90 tablet 1  . rivaroxaban (XARELTO) 20 MG TABS tablet Take 1 tablet (20 mg total) by mouth daily. Take 20 mg by mouth daily., Disp-90 tablet, R-3 90 tablet 3  . valsartan (DIOVAN) 160 MG tablet Take 160 mg by mouth daily.     No current facility-administered medications on file  prior to visit.    BP 130/80 mmHg  Temp(Src) 98.1 F (36.7 C) (Oral)  Ht 5\' 7"  (1.702 m)  Wt 248 lb 6.4 oz (112.674 kg)  BMI 38.90 kg/m2    Objective:   Physical Exam  Gen: NAD, resting comfortably CV: irregularly irregular, no murmurs rubs or gallops Lungs: CTAB no crackles, wheeze, rhonchi Abdomen: soft/nontender/nondistended/normal bowel sounds.  Ext: no edema Skin: warm, dry, no rash Neuro: grossly normal, moves all extremities       Assessment & Plan:  1. Atrial fibrillation, unspecified - EKG 12-Lead EKG: atrial fibrillation with rate 77, nml axis, qt, qrs interval. No obvious hypertrophy or ischemic changes.  - EKG faxed to cardiologist in [facility] - He will follow up with them  - Continue Xarelto and Metoprolol.  - Follow up as needed

## 2015-05-13 NOTE — Telephone Encounter (Signed)
Noted  

## 2015-05-13 NOTE — Telephone Encounter (Signed)
[facility] - [facility]  Patient Name: Guy Moore  DOB: [birth year]    Initial Comment Caller states his heartbeat is irregular.    Nurse Assessment  Nurse: Justine Null, RN, Rodena Piety Date/Time (Eastern Time): 05/13/2015 3:39:07 PM  Confirm and document reason for call. If symptomatic, describe symptoms. ---Caller states his heartbeat is irregular that started yesterday and has been having an extra beat in the past week and has a history of atrial fib and has no breathing difficulty at rest and has been having slight with exertion and has been having issues with his BP yesterday  Has the patient traveled out of the country within the last 30 days? ---No  Does the patient require triage? ---Yes  Related visit to physician within the last 2 weeks? ---No  Does the PT have any chronic conditions? (i.e. diabetes, asthma, etc.) ---Yes  List chronic conditions. ---atrial fib heart disease HTN diabetes type II     Guidelines    Guideline Title Affirmed Question Affirmed Notes  Heart Rate and Heartbeat Questions New or worsened shortness of breath with activity (dyspnea on exertion)    Final Disposition User   Go to ED Now (or PCP triage) Justine Null, RN, Rodena Piety    Referrals  [facility]   Disagree/Comply: Comply

## 2015-05-13 NOTE — Telephone Encounter (Signed)
FYI

## 2015-05-13 NOTE — Telephone Encounter (Signed)
Pt coming in to see [facility] at 4:45 today.

## 2015-05-13 NOTE — Telephone Encounter (Signed)
Pt thinks he is going into AFIB bc this has happened before. Pt states dr hunter did EKG and that is all pt thinks he needs. Refusing to go to ED/ would lik to know if someone here could do EKG for him Pls advisee

## 2015-05-13 NOTE — Progress Notes (Signed)
Pre visit review using our clinic review tool, if applicable. No additional management support is needed unless otherwise documented below in the visit note. 

## 2015-05-13 NOTE — Patient Instructions (Signed)
It was great meeting you today!  Please follow up with your cardiologist in [place]. We will fax over the EKG and let them know what is going on.

## 2015-05-17 ENCOUNTER — Telehealth: Payer: Self-pay

## 2015-05-17 NOTE — Telephone Encounter (Signed)
Called to check on patient.  Pt states he had spoken with the heart specialist office.  The physician is out on vacation but the PA told pt to continue to monitor his pulse since it is in the low 60's.  Pt states he was told that once his physician returned he would probably want the patient to wear a monitor.  Pt will have a tooth implant surgery on Monday and was told it was ok to go ahead and have it. Pt will call to update [place] as needed.

## 2015-06-18 ENCOUNTER — Ambulatory Visit (INDEPENDENT_AMBULATORY_CARE_PROVIDER_SITE_OTHER): Payer: Medicare Other | Admitting: Pulmonary Disease

## 2015-06-18 ENCOUNTER — Encounter: Payer: Self-pay | Admitting: Pulmonary Disease

## 2015-06-18 ENCOUNTER — Other Ambulatory Visit: Payer: Self-pay | Admitting: Family Medicine

## 2015-06-18 VITALS — BP 130/78 | HR 75 | Temp 97.8°F | Ht 70.0 in | Wt 252.0 lb

## 2015-06-18 DIAGNOSIS — G4733 Obstructive sleep apnea (adult) (pediatric): Secondary | ICD-10-CM | POA: Diagnosis not present

## 2015-06-18 DIAGNOSIS — E669 Obesity, unspecified: Secondary | ICD-10-CM | POA: Diagnosis not present

## 2015-06-18 DIAGNOSIS — Z9989 Dependence on other enabling machines and devices: Principal | ICD-10-CM

## 2015-06-18 NOTE — Progress Notes (Signed)
Chief Complaint  Patient presents with  . Sleep Apnea    Former [facility] patient; no sd card; wears CPAP every night; complains of dry mouth with CPAP. pressure has not been reset since he received the machine. no flu shot    History of Present Illness: Guy Moore is a 81 y.o. male with obstructive sleep apnea.  He was previously followed by Dr. Gwenette Greet.  He has noticed trouble with mouth dryness.  His machine is more than 79 yrs old.  He uses CPAP every night.  TESTS: PSG 07/24/09 >> AHI 58, SpO2 low 74%  PMhx >> A fib, HTN, HLD, DM, PUD, HH, Diverticulosis, GERD, Depression  Past surgical hx, Medications, Allergies, Family hx, Social hx all reviewed.   Physical Exam: BP 130/78 mmHg  Pulse 75  Temp(Src) 97.8 F (36.6 C) (Oral)  Ht 5\' 10"  (1.778 m)  Wt 252 lb (114.306 kg)  BMI 36.16 kg/m2  SpO2 97%  General - No distress ENT - No sinus tenderness, no oral exudate, no LAN, s/p UPPP Cardiac - irregular, no murmur Chest - No wheeze/rales/dullness Back - No focal tenderness Abd - Soft, non-tender Ext - No edema Neuro - Normal strength Skin - No rashes Psych - normal mood, and behavior   Assessment/Plan:  Obstructive sleep apnea. He is compliant with therapy and reports benefit from CPAP. Plan: - will call him with report of CPAP download - will work on getting him new auto CPAP machine  Obesity. Plan: - discussed importance of weight loss   Guy Mires, MD [facility] Pager:  [phone]

## 2015-06-18 NOTE — Patient Instructions (Signed)
Will arrange for new CPAP machine  Will get copy of CPAP report  Follow up in 1 year

## 2015-06-20 DIAGNOSIS — I480 Paroxysmal atrial fibrillation: Secondary | ICD-10-CM | POA: Diagnosis not present

## 2015-06-24 ENCOUNTER — Ambulatory Visit: Payer: Medicare Other | Admitting: Pulmonary Disease

## 2015-06-27 DIAGNOSIS — I4891 Unspecified atrial fibrillation: Secondary | ICD-10-CM | POA: Diagnosis not present

## 2015-06-27 DIAGNOSIS — I2510 Atherosclerotic heart disease of native coronary artery without angina pectoris: Secondary | ICD-10-CM | POA: Diagnosis not present

## 2015-06-27 LAB — BASIC METABOLIC PANEL
BUN: 21 mg/dL (ref 4–21)
Creatinine: 1 mg/dL (ref <=1.3)
Glucose: 121 mg/dL
POTASSIUM: 4.1 mmol/L (ref 3.4–5.3)
SODIUM: 141 mmol/L (ref 137–147)

## 2015-06-28 DIAGNOSIS — I4891 Unspecified atrial fibrillation: Secondary | ICD-10-CM | POA: Diagnosis not present

## 2015-06-28 DIAGNOSIS — I4892 Unspecified atrial flutter: Secondary | ICD-10-CM | POA: Diagnosis not present

## 2015-06-28 DIAGNOSIS — Z79899 Other long term (current) drug therapy: Secondary | ICD-10-CM | POA: Diagnosis not present

## 2015-06-28 DIAGNOSIS — Z8249 Family history of ischemic heart disease and other diseases of the circulatory system: Secondary | ICD-10-CM | POA: Diagnosis not present

## 2015-06-28 DIAGNOSIS — Z87891 Personal history of nicotine dependence: Secondary | ICD-10-CM | POA: Diagnosis not present

## 2015-06-28 DIAGNOSIS — G4730 Sleep apnea, unspecified: Secondary | ICD-10-CM | POA: Diagnosis not present

## 2015-06-28 DIAGNOSIS — K219 Gastro-esophageal reflux disease without esophagitis: Secondary | ICD-10-CM | POA: Diagnosis not present

## 2015-06-28 DIAGNOSIS — I491 Atrial premature depolarization: Secondary | ICD-10-CM | POA: Diagnosis not present

## 2015-06-28 DIAGNOSIS — Z7901 Long term (current) use of anticoagulants: Secondary | ICD-10-CM | POA: Diagnosis not present

## 2015-06-28 DIAGNOSIS — I471 Supraventricular tachycardia: Secondary | ICD-10-CM | POA: Diagnosis not present

## 2015-06-28 DIAGNOSIS — E119 Type 2 diabetes mellitus without complications: Secondary | ICD-10-CM | POA: Diagnosis not present

## 2015-06-28 DIAGNOSIS — E785 Hyperlipidemia, unspecified: Secondary | ICD-10-CM | POA: Diagnosis not present

## 2015-06-28 DIAGNOSIS — I481 Persistent atrial fibrillation: Secondary | ICD-10-CM | POA: Diagnosis not present

## 2015-06-28 DIAGNOSIS — I10 Essential (primary) hypertension: Secondary | ICD-10-CM | POA: Diagnosis not present

## 2015-06-29 DIAGNOSIS — I491 Atrial premature depolarization: Secondary | ICD-10-CM | POA: Diagnosis not present

## 2015-06-29 DIAGNOSIS — I4892 Unspecified atrial flutter: Secondary | ICD-10-CM | POA: Diagnosis not present

## 2015-06-29 DIAGNOSIS — E785 Hyperlipidemia, unspecified: Secondary | ICD-10-CM | POA: Diagnosis not present

## 2015-06-29 DIAGNOSIS — I4891 Unspecified atrial fibrillation: Secondary | ICD-10-CM | POA: Diagnosis not present

## 2015-06-29 DIAGNOSIS — I10 Essential (primary) hypertension: Secondary | ICD-10-CM | POA: Diagnosis not present

## 2015-06-29 DIAGNOSIS — I471 Supraventricular tachycardia: Secondary | ICD-10-CM | POA: Diagnosis not present

## 2015-06-29 LAB — CBC AND DIFFERENTIAL
HCT: 39 % — AB (ref 41–53)
HEMOGLOBIN: 12.7 g/dL — AB (ref 13.5–17.5)
WBC: 5.4 10^3/mL

## 2015-07-02 ENCOUNTER — Encounter: Payer: Self-pay | Admitting: Family Medicine

## 2015-07-03 ENCOUNTER — Encounter: Payer: Self-pay | Admitting: Family Medicine

## 2015-07-03 ENCOUNTER — Telehealth: Payer: Self-pay | Admitting: Family Medicine

## 2015-07-03 ENCOUNTER — Ambulatory Visit (INDEPENDENT_AMBULATORY_CARE_PROVIDER_SITE_OTHER): Payer: Medicare Other | Admitting: Family Medicine

## 2015-07-03 VITALS — BP 156/83 | HR 81 | Temp 100.2°F | Ht 70.0 in | Wt 259.0 lb

## 2015-07-03 DIAGNOSIS — J209 Acute bronchitis, unspecified: Secondary | ICD-10-CM | POA: Diagnosis not present

## 2015-07-03 MED ORDER — CEFUROXIME AXETIL 500 MG PO TABS: 500.0000 mg | ORAL_TABLET | Freq: Two times a day (BID) | ORAL | Status: DC

## 2015-07-03 NOTE — Progress Notes (Signed)
   Subjective:    Patient ID: Guy Moore, male    DOB: 09-16-1934, 81 y.o.   MRN: [id number]  HPI Here for 2 days of fever to 100.4 degrees, mild SOB, and chest congestion. Some non-productive coughing. No GI sx. No urinary sx. On 06-28-15 he had a successful ablation procedure for atrial fibrillation at [facility]. He is drinking plenty of fluids.    Review of Systems  Constitutional: Positive for fever.  HENT: Negative.   Eyes: Negative.   Respiratory: Positive for cough, chest tightness and shortness of breath. Negative for wheezing.   Cardiovascular: Negative.   Genitourinary: Negative.   Neurological: Negative.        Objective:   Physical Exam  Constitutional: He is oriented to person, place, and time. He appears well-developed and well-nourished. No distress.  HENT:  Right Ear: External ear normal.  Left Ear: External ear normal.  Nose: Nose normal.  Mouth/Throat: Oropharynx is clear and moist.  Eyes: Conjunctivae are normal.  Neck: Neck supple. No thyromegaly present.  Cardiovascular: Normal rate, regular rhythm, normal heart sounds and intact distal pulses.   Pulmonary/Chest: Effort normal and breath sounds normal. No respiratory distress. He has no wheezes. He has no rales.  Abdominal: Soft. Bowel sounds are normal. He exhibits no distension and no mass. There is no tenderness. There is no rebound and no guarding.  Musculoskeletal: He exhibits no edema.  Lymphadenopathy:    He has no cervical adenopathy.  Neurological: He is alert and oriented to person, place, and time.          Assessment & Plan:  Here for 2 days of URI symptoms and a low grade fever. We will cover for any emerging bronchitis with Ceftin 500 mg bid for 10 days. Recheck prn

## 2015-07-03 NOTE — Telephone Encounter (Signed)
Pt said Dr Sarajane Jews have agreed to take him and his wife on as new patients from Dr Yong Channel. Do they need an establisment visit.

## 2015-07-03 NOTE — Telephone Encounter (Signed)
See below
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
## 2015-07-03 NOTE — Telephone Encounter (Signed)
Okay per Dr. Fry.  

## 2015-07-03 NOTE — Progress Notes (Signed)
Pre visit review using our clinic review tool, if applicable. No additional management support is needed unless otherwise documented below in the visit note. 

## 2015-07-03 NOTE — Telephone Encounter (Signed)
Pt has been scheduled w/ dr fry this afternoon

## 2015-07-03 NOTE — Telephone Encounter (Signed)
11 30 is now filled- lets get him in with someone this afternoon

## 2015-07-04 NOTE — Telephone Encounter (Signed)
lmovm to call and schedule establishment visit

## 2015-07-04 NOTE — Telephone Encounter (Signed)
yes

## 2015-07-04 NOTE — Telephone Encounter (Signed)
Do they need 30 minute establisment visit

## 2015-07-05 NOTE — Telephone Encounter (Signed)
Pt and wife have been scheduled w/ dr fry.

## 2015-07-07 ENCOUNTER — Other Ambulatory Visit: Payer: Self-pay | Admitting: Family Medicine

## 2015-07-15 ENCOUNTER — Ambulatory Visit: Payer: TRICARE For Life (TFL) | Admitting: Family Medicine

## 2015-08-07 ENCOUNTER — Ambulatory Visit (INDEPENDENT_AMBULATORY_CARE_PROVIDER_SITE_OTHER): Payer: Medicare Other | Admitting: Family Medicine

## 2015-08-07 ENCOUNTER — Encounter: Payer: Self-pay | Admitting: Family Medicine

## 2015-08-07 VITALS — BP 132/75 | HR 63 | Temp 98.4°F | Ht 70.0 in | Wt 244.0 lb

## 2015-08-07 DIAGNOSIS — I10 Essential (primary) hypertension: Secondary | ICD-10-CM | POA: Diagnosis not present

## 2015-08-07 DIAGNOSIS — K219 Gastro-esophageal reflux disease without esophagitis: Secondary | ICD-10-CM

## 2015-08-07 DIAGNOSIS — I482 Chronic atrial fibrillation, unspecified: Secondary | ICD-10-CM

## 2015-08-07 DIAGNOSIS — E785 Hyperlipidemia, unspecified: Secondary | ICD-10-CM

## 2015-08-07 DIAGNOSIS — E119 Type 2 diabetes mellitus without complications: Secondary | ICD-10-CM

## 2015-08-07 NOTE — Progress Notes (Signed)
   Subjective:    Patient ID: Guy Moore, male    DOB: [birth year], 81 y.o.   MRN: [id number]  HPI 81 yr old male to establish with [place] for primary care. He feels well in general. His HTN is stable and his atrial fibrillation is in remission after his last ablation. His diabetes is doing well. He tries to walk for exercise and he watches his diet.    Review of Systems  Constitutional: Negative.   Respiratory: Negative.   Cardiovascular: Negative.   Endocrine: Negative.   Neurological: Negative.        Objective:   Physical Exam  Constitutional: He is oriented to person, place, and time. He appears well-developed and well-nourished.  Neck: No thyromegaly present.  Cardiovascular: Normal rate, regular rhythm, normal heart sounds and intact distal pulses.   Pulmonary/Chest: Effort normal and breath sounds normal.  Abdominal: Soft. Bowel sounds are normal. He exhibits no distension and no mass. There is no tenderness. There is no rebound and no guarding.  Lymphadenopathy:    He has no cervical adenopathy.  Neurological: He is alert and oriented to person, place, and time.          Assessment & Plan:  He seems to be doing well from a cardiac standpoint. His diabetes is stable with an A1c of 6.1 in July. We reviewed diet and exercise advice.

## 2015-08-07 NOTE — Progress Notes (Signed)
Pre visit review using our clinic review tool, if applicable. No additional management support is needed unless otherwise documented below in the visit note. 

## 2015-08-13 ENCOUNTER — Encounter: Payer: Self-pay | Admitting: Family Medicine

## 2015-08-13 MED ORDER — VALSARTAN 160 MG PO TABS: 160.0000 mg | ORAL_TABLET | Freq: Every day | ORAL | Status: DC

## 2015-08-21 ENCOUNTER — Encounter: Payer: Self-pay | Admitting: Gastroenterology

## 2015-09-02 DIAGNOSIS — H353121 Nonexudative age-related macular degeneration, left eye, early dry stage: Secondary | ICD-10-CM | POA: Diagnosis not present

## 2015-09-02 DIAGNOSIS — H353112 Nonexudative age-related macular degeneration, right eye, intermediate dry stage: Secondary | ICD-10-CM | POA: Diagnosis not present

## 2015-09-03 DIAGNOSIS — I480 Paroxysmal atrial fibrillation: Secondary | ICD-10-CM | POA: Diagnosis not present

## 2015-11-06 ENCOUNTER — Other Ambulatory Visit: Payer: Self-pay | Admitting: Family Medicine

## 2015-12-03 ENCOUNTER — Emergency Department (HOSPITAL_COMMUNITY)
Admission: EM | Admit: 2015-12-03 | Discharge: 2015-12-03 | Disposition: A | Discharge status: Home / Self Care | Payer: No Typology Code available for payment source | Attending: Emergency Medicine | Admitting: Emergency Medicine

## 2015-12-03 ENCOUNTER — Emergency Department (HOSPITAL_COMMUNITY): Payer: No Typology Code available for payment source

## 2015-12-03 DIAGNOSIS — Y998 Other external cause status: Secondary | ICD-10-CM | POA: Diagnosis not present

## 2015-12-03 DIAGNOSIS — Y92410 Unspecified street and highway as the place of occurrence of the external cause: Secondary | ICD-10-CM | POA: Insufficient documentation

## 2015-12-03 DIAGNOSIS — Z79899 Other long term (current) drug therapy: Secondary | ICD-10-CM | POA: Insufficient documentation

## 2015-12-03 DIAGNOSIS — H9190 Unspecified hearing loss, unspecified ear: Secondary | ICD-10-CM | POA: Insufficient documentation

## 2015-12-03 DIAGNOSIS — S8992XA Unspecified injury of left lower leg, initial encounter: Secondary | ICD-10-CM | POA: Diagnosis not present

## 2015-12-03 DIAGNOSIS — M25552 Pain in left hip: Secondary | ICD-10-CM | POA: Diagnosis not present

## 2015-12-03 DIAGNOSIS — R0781 Pleurodynia: Secondary | ICD-10-CM

## 2015-12-03 DIAGNOSIS — S79912A Unspecified injury of left hip, initial encounter: Secondary | ICD-10-CM | POA: Diagnosis not present

## 2015-12-03 DIAGNOSIS — S8002XA Contusion of left knee, initial encounter: Secondary | ICD-10-CM | POA: Diagnosis not present

## 2015-12-03 DIAGNOSIS — S0990XA Unspecified injury of head, initial encounter: Secondary | ICD-10-CM | POA: Diagnosis not present

## 2015-12-03 DIAGNOSIS — Y9389 Activity, other specified: Secondary | ICD-10-CM | POA: Diagnosis not present

## 2015-12-03 DIAGNOSIS — M25551 Pain in right hip: Secondary | ICD-10-CM | POA: Diagnosis not present

## 2015-12-03 DIAGNOSIS — I10 Essential (primary) hypertension: Secondary | ICD-10-CM | POA: Insufficient documentation

## 2015-12-03 DIAGNOSIS — S29001A Unspecified injury of muscle and tendon of front wall of thorax, initial encounter: Secondary | ICD-10-CM | POA: Diagnosis not present

## 2015-12-03 DIAGNOSIS — S299XXA Unspecified injury of thorax, initial encounter: Secondary | ICD-10-CM | POA: Diagnosis not present

## 2015-12-03 DIAGNOSIS — E119 Type 2 diabetes mellitus without complications: Secondary | ICD-10-CM | POA: Diagnosis not present

## 2015-12-03 DIAGNOSIS — F329 Major depressive disorder, single episode, unspecified: Secondary | ICD-10-CM | POA: Diagnosis not present

## 2015-12-03 DIAGNOSIS — M25562 Pain in left knee: Secondary | ICD-10-CM | POA: Diagnosis not present

## 2015-12-03 DIAGNOSIS — E785 Hyperlipidemia, unspecified: Secondary | ICD-10-CM | POA: Insufficient documentation

## 2015-12-03 DIAGNOSIS — Z87891 Personal history of nicotine dependence: Secondary | ICD-10-CM | POA: Diagnosis not present

## 2015-12-03 DIAGNOSIS — S79911A Unspecified injury of right hip, initial encounter: Secondary | ICD-10-CM | POA: Diagnosis not present

## 2015-12-03 DIAGNOSIS — S199XXA Unspecified injury of neck, initial encounter: Secondary | ICD-10-CM | POA: Diagnosis not present

## 2015-12-03 LAB — CBC WITH DIFFERENTIAL/PLATELET
Basophils Absolute: 0 10*3/uL (ref 0.0–0.1)
Basophils Relative: 0 %
Eosinophils Absolute: 0.1 10*3/uL (ref 0.0–0.7)
Eosinophils Relative: 2 %
HCT: 41.5 % (ref 39.0–52.0)
HEMOGLOBIN: 13.4 g/dL (ref 13.0–17.0)
LYMPHS ABS: 0.3 10*3/uL — AB (ref 0.7–4.0)
Lymphocytes Relative: 5 %
MCH: 28.3 pg (ref 26.0–34.0)
MCHC: 32.3 g/dL (ref 30.0–36.0)
MCV: 87.6 fL (ref 78.0–100.0)
Monocytes Absolute: 0.3 10*3/uL (ref 0.1–1.0)
Monocytes Relative: 5 %
NEUTROS ABS: 4.6 10*3/uL (ref 1.7–7.7)
NEUTROS PCT: 88 %
Platelets: 122 10*3/uL — ABNORMAL LOW (ref 150–400)
RBC: 4.74 MIL/uL (ref 4.22–5.81)
RDW: 14.9 % (ref 11.5–15.5)
WBC: 5.3 10*3/uL (ref 4.0–10.5)

## 2015-12-03 LAB — BASIC METABOLIC PANEL
ANION GAP: 12 (ref 5–15)
BUN: 18 mg/dL (ref 6–20)
CHLORIDE: 102 mmol/L (ref 101–111)
CO2: 27 mmol/L (ref 22–32)
Calcium: 9.2 mg/dL (ref 8.9–10.3)
Creatinine, Ser: 1.07 mg/dL (ref 0.61–1.24)
GFR calc Af Amer: >60 mL/min (ref >60)
GLUCOSE: 125 mg/dL — AB (ref 65–99)
POTASSIUM: 4.3 mmol/L (ref 3.5–5.1)
Sodium: 141 mmol/L (ref 135–145)

## 2015-12-03 MED ORDER — ACETAMINOPHEN 325 MG PO TABS
650.0000 mg | ORAL_TABLET | Freq: Once | ORAL | Status: AC
  Administered 2015-12-03: 650 mg via ORAL
  Filled 2015-12-03: qty 2

## 2015-12-03 NOTE — ED Notes (Signed)
Pt discharged with all belongings

## 2015-12-03 NOTE — ED Notes (Signed)
Pt ambulated to the bathroom with standby assistance. Pt stated he was in a little pain
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
## 2015-12-09 ENCOUNTER — Encounter: Payer: Self-pay | Admitting: Family Medicine

## 2015-12-09 ENCOUNTER — Ambulatory Visit (INDEPENDENT_AMBULATORY_CARE_PROVIDER_SITE_OTHER): Payer: Medicare Other | Admitting: Family Medicine

## 2015-12-09 VITALS — BP 156/79 | HR 60 | Temp 98.6°F

## 2015-12-09 DIAGNOSIS — S20212D Contusion of left front wall of thorax, subsequent encounter: Secondary | ICD-10-CM | POA: Diagnosis not present

## 2015-12-09 DIAGNOSIS — S7000XD Contusion of unspecified hip, subsequent encounter: Secondary | ICD-10-CM

## 2015-12-09 DIAGNOSIS — F4322 Adjustment disorder with anxiety: Secondary | ICD-10-CM

## 2015-12-09 DIAGNOSIS — S161XXD Strain of muscle, fascia and tendon at neck level, subsequent encounter: Secondary | ICD-10-CM | POA: Diagnosis not present

## 2015-12-09 MED ORDER — TRAMADOL HCL 50 MG PO TABS: 100.0000 mg | ORAL_TABLET | Freq: Four times a day (QID) | ORAL | Status: DC | PRN

## 2015-12-09 NOTE — Progress Notes (Signed)
Pre visit review using our clinic review tool, if applicable. No additional management support is needed unless otherwise documented below in the visit note. Pt unable to weigh 
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
## 2015-12-15 ENCOUNTER — Other Ambulatory Visit: Payer: Self-pay | Admitting: Family Medicine

## 2015-12-16 ENCOUNTER — Encounter: Payer: Self-pay | Admitting: Family Medicine

## 2015-12-16 ENCOUNTER — Ambulatory Visit (INDEPENDENT_AMBULATORY_CARE_PROVIDER_SITE_OTHER): Payer: Medicare Other | Admitting: Family Medicine

## 2015-12-16 VITALS — BP 131/68 | HR 59 | Temp 98.5°F | Ht 70.0 in | Wt 254.0 lb

## 2015-12-16 DIAGNOSIS — M4124 Other idiopathic scoliosis, thoracic region: Secondary | ICD-10-CM | POA: Diagnosis not present

## 2015-12-16 DIAGNOSIS — S20212D Contusion of left front wall of thorax, subsequent encounter: Secondary | ICD-10-CM | POA: Diagnosis not present

## 2015-12-16 DIAGNOSIS — M419 Scoliosis, unspecified: Secondary | ICD-10-CM | POA: Insufficient documentation
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
## 2015-12-16 NOTE — Progress Notes (Signed)
Pre visit review using our clinic review tool, if applicable. No additional management support is needed unless otherwise documented below in the visit note. 

## 2015-12-24 ENCOUNTER — Ambulatory Visit: Payer: No Typology Code available for payment source | Attending: Family Medicine | Admitting: Physical Therapy

## 2015-12-24 DIAGNOSIS — R0781 Pleurodynia: Secondary | ICD-10-CM | POA: Insufficient documentation

## 2015-12-24 DIAGNOSIS — M5382 Other specified dorsopathies, cervical region: Secondary | ICD-10-CM

## 2015-12-24 DIAGNOSIS — M542 Cervicalgia: Secondary | ICD-10-CM | POA: Diagnosis not present

## 2015-12-24 DIAGNOSIS — M545 Low back pain, unspecified: Secondary | ICD-10-CM

## 2015-12-24 DIAGNOSIS — M5386 Other specified dorsopathies, lumbar region: Secondary | ICD-10-CM

## 2015-12-24 DIAGNOSIS — R6889 Other general symptoms and signs: Secondary | ICD-10-CM | POA: Diagnosis not present

## 2015-12-24 DIAGNOSIS — M546 Pain in thoracic spine: Secondary | ICD-10-CM | POA: Insufficient documentation

## 2015-12-24 DIAGNOSIS — M791 Myalgia: Secondary | ICD-10-CM | POA: Insufficient documentation

## 2015-12-24 DIAGNOSIS — M5384 Other specified dorsopathies, thoracic region: Secondary | ICD-10-CM

## 2015-12-24 DIAGNOSIS — M7918 Myalgia, other site: Secondary | ICD-10-CM

## 2015-12-24 DIAGNOSIS — M2560 Stiffness of unspecified joint, not elsewhere classified: Secondary | ICD-10-CM | POA: Diagnosis not present
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
## 2015-12-24 NOTE — Patient Instructions (Signed)
Posture Tips DO: - stand tall and erect - keep chin tucked in - keep head and shoulders in alignment - check posture regularly in mirror or large window - pull head back against headrest in car seat;  Change your position often.  Sit with lumbar support. DON'T: - slouch or slump while watching TV or reading - sit, stand or lie in one position  for too long;  Sitting is especially hard on the spine so if you sit at a desk/use the computer, then stand up often!   Copyright  VHI. All rights reserved.  Posture - Standing   Good posture is important. Avoid slouching and forward head thrust. Maintain curve in low back and align ears over shoul- ders, hips over ankles.  Pull your belly button in toward your back bone. Stand with even weight in toes and heels   Stand with ribs lifted up and chin down. Not Military   Copyright  VHI. All rights reserved.  Posture - Sitting   Sit upright, head facing forward. Try using a roll to support lower back. Keep shoulders relaxed, and avoid rounded back. Keep hips level with knees. Avoid crossing legs for long periods. Sit on sit bones not tailbone  And dont perch on edge of seat   Copyright  VHI. All rights reserved.   Double Knee to Chest (Flexion)     Copyright  VHI. All rights reserved.  Lower Trunk Rotation Stretch   Keeping back flat and feet together, rotate knees to left side. Hold ___30- 60_ seconds. Repeat __3_ times per set. . Do __2-3__ sessions per day.  http://orth.exer.us/122   Voncille Lo, PT 12/24/2015 8:44 AM Phone: [phone] Fax: [phone]

## 2015-12-31 ENCOUNTER — Ambulatory Visit: Payer: No Typology Code available for payment source | Admitting: Physical Therapy

## 2015-12-31 DIAGNOSIS — M5382 Other specified dorsopathies, cervical region: Secondary | ICD-10-CM

## 2015-12-31 DIAGNOSIS — M545 Low back pain, unspecified: Secondary | ICD-10-CM

## 2015-12-31 DIAGNOSIS — M2560 Stiffness of unspecified joint, not elsewhere classified: Secondary | ICD-10-CM | POA: Diagnosis not present

## 2015-12-31 DIAGNOSIS — M7918 Myalgia, other site: Secondary | ICD-10-CM

## 2015-12-31 DIAGNOSIS — M5384 Other specified dorsopathies, thoracic region: Secondary | ICD-10-CM

## 2015-12-31 DIAGNOSIS — M542 Cervicalgia: Secondary | ICD-10-CM

## 2015-12-31 DIAGNOSIS — M5386 Other specified dorsopathies, lumbar region: Secondary | ICD-10-CM

## 2015-12-31 DIAGNOSIS — R6889 Other general symptoms and signs: Secondary | ICD-10-CM

## 2015-12-31 DIAGNOSIS — M791 Myalgia: Secondary | ICD-10-CM | POA: Diagnosis not present

## 2015-12-31 DIAGNOSIS — M546 Pain in thoracic spine: Secondary | ICD-10-CM

## 2015-12-31 DIAGNOSIS — R0781 Pleurodynia: Secondary | ICD-10-CM

## 2015-12-31 NOTE — Therapy (Signed)
[place], [place], [address] Phone: [phone]   Fax:  [phone]  Physical Therapy Treatment  Patient Details  Name: Guy Moore MRN: [id number] Date of Birth: 1934/07/26 Referring Provider: Alysia Penna MD  Encounter Date: 12/31/2015      PT End of Session - 12/31/15 1507    Visit Number 2   Number of Visits 16   Date for PT Re-Evaluation 02/18/16   Authorization Type Med Pay  Medicare  02-18-16   Authorization Time Period 02-18-16   PT Start Time 0258   PT Stop Time 0345   PT Time Calculation (min) 47 min      Past Medical History  Diagnosis Date  . Atrial fibrillation [facility])     se[facility] [name] at [facility]   . Atrial flutter ([place])   . HTN (hypertension)   . Other and unspecified hyperlipidemia   . Type II or unspecified type diabetes mellitus without mention of complication, not stated as uncontrolled     Diet control   . Hyperglycemia   . BCC (basal cell carcinoma of skin)     Nose  . Knee problem     2% permanent partial impairment Right  . Gastric ulcer   . Hiatal hernia   . Allergic rhinitis   . Hearing loss     uses amplification  . Diverticulosis   . Erosive esophagitis   . Esophageal stricture   . Arthritis   . Depression   . Sleep apnea   . Hemorrhoids   . DIVERTICULOSIS, COLON 04/13/2007          Past Surgical History  Procedure Laterality Date  . Uvulopalatopharyngoplasty  1980's  . Vasectomy  2376'E    w/complications  . Cardiac electrophysiology study and ablation      x 4 (sees Dr. [name] at [facility])  . Nose surgery      There were no vitals filed for this visit.  Visit Diagnosis:  No diagnosis found.      Subjective Assessment - 12/31/15 1502    Subjective I feel like I am getting better   Aggravating Factors  sitting for longer than 30 minutes and then having to stand up   Pain Relieving Factors medication   Pain Score 0   Pain Location Neck   Pain Orientation Right   Aggravating Factors  intermittent with cervical rotation   Pain Score 0   Pain Location Back  see thoracic                         [facility] Adult PT Treatment/Exercise - 12/31/15 0001    Neck Exercises: Seated   Neck Retraction 10 reps   Other Seated Exercise Cervical AROM 5 x 5-10 seconds each plane   Lumbar Exercises: Stretches   Lower Trunk Rotation 3 reps;30 seconds  bil   Shoulder Exercises: ROM/Strengthening   Other ROM/Strengthening Exercises UE flexion on wall bilateral for thoracic extension 10 sec x 10, Seated thoracic extension using chair with lean back    Other ROM/Strengthening Exercises side lying book openings x 5 each side for 10 seconds                  PT Short Term Goals - 12/31/15 1657    PT SHORT TERM GOAL #1   Title "Independent with initial HEP 01-21-16   Time 4   Period Weeks  Status On-going   PT SHORT TERM GOAL #2   Title Pain in spine to be reduced from 8/10 to 4/10 with functional movement 01-21-16   Time 4   Period Weeks   Status Partially Met   PT SHORT TERM GOAL #3   Title "Demonstrate understanding of proper sitting posture and be more conscious of position and posture throughout the day. 01-21-16   Time 4   Period Weeks   Status Partially Met   PT SHORT TERM GOAL #4   Title increase gait velocity to at least 2.62 ft/sec for community level ambulation 01-21-16   Time 4   Period Weeks   Status Unable to assess           PT Long Term Goals - 12/24/15 0847    PT LONG TERM GOAL #1   Title "Demonstrate and verbalize techniques to reduce the risk of re-injury including: lifting, posture, body mechanics. 02-18-16   Time 8   Period Weeks   Status New   PT LONG TERM GOAL #2   Title "Pt will be independent with advanced HEP including core strength and flexibility 02-18-16   Time 8   Period Weeks   Status New   PT LONG TERM GOAL #3   Title "Pain will  decrease to 1/10 or less with all functional activities 02-18-16   Time 8   Period Weeks   Status New   PT LONG TERM GOAL #4   Title "FOTO will improve from  49%  to  33%   indicating improved functional mobility. 02-18-16   Time 8   Period Weeks   Status New   PT LONG TERM GOAL #5   Title Pt will be able to rise from sitting in car to standing with minimal pain and decreased UE support 02-18-16   Time 8   Period Weeks   Status New   PT LONG TERM GOAL #6   Title "Pt will not wake due to pain while turning in bed while sleeping at night 02-18-16   Time 8   Period Weeks   Status New   PT LONG TERM GOAL #7   Title Pt will return to 2 mile walking program without exacerbation of pain. 02-18-16   Baseline Pt currently unable to do walking program without rebounding pain the following day   Time 8   Period Weeks   Status New               Plan - 12/31/15 1654    Clinical Impression Statement pt reports improved sx since last visit. He feels pain only intermittently now in the cervical, thoracic and lumbar areas. Instructed pt is thoracic mobility and neck arom exercises with good tolerance. Review of HEP with no increase in pain. STG# 2,3 partially met.    PT Next Visit Plan Review  neck AROM, trunk flexibility , thoracic extension in chair. low back flexiblity, beginning core strength as have time.  May use KT tape for left rib pain        Problem List Patient Active Problem List   Diagnosis Date Noted  . Thoracic scoliosis 12/16/2015  . Diabetes mellitus without complication ([place]) 38/18/2993  . Former smoker 04/12/2015  . Thrombocytopenia ([place]) 04/12/2015  . History of basal cell cancer 06/27/2014  . Shingles 03/24/2014  . Bleeding gums 04/15/2013  . Routine health maintenance 03/11/2012  . GERD (gastroesophageal reflux disease) 08/31/2011  . Anticoagulant long-term use 08/31/2011  . OBSTRUCTIVE SLEEP APNEA 07/04/2009  .  Atrial fibrillation ([place]) 04/19/2009  . DM II- diet  controlled 10/21/2007  . ALLERGIC RHINITIS 04/14/2007  . Hyperlipidemia 04/13/2007  . Essential hypertension 04/13/2007    Dorene Ar, PTA 12/31/2015, 4:58 PM  [facility] [address] [place], [place], [address] Phone: [phone]   Fax:  [phone]  Name: Guy Moore MRN: [id number] Date of Birth: 1934-01-14

## 2015-12-31 NOTE — Patient Instructions (Signed)
AROM: Neck Rotation   Turn head slowly to look over one shoulder, then the other. Hold each position __5-10__ seconds. Repeat __5__ times each wayt. Do _1___ sets per session. Do _2-3___ sessions per day.  http://orth.exer.us/294   Copyright  VHI. All rights reserved.  AROM: Lateral Neck Flexion   Slowly tilt head toward one shoulder, then the other. Hold each position __5-10_ seconds. Repeat _5___ times per set. Do __1__ sets per session. Do __2-3__ sessions per day.  http://orth.exer.us/296   Copyright  VHI. All rights reserved.  Extension   Hands behind neck, bend head back as far as is comfortable. Hold __5-10__ seconds. Repeat __5__ times. Do ___2-3_ sessions per day.  Copyright  VHI. All rights reserved.  AROM: Neck Flexion   Bend head forward. Hold __5-10__ seconds. Repeat _5___ times per set. Do __1__ sets per session. Do _2-3___ sessions per day.  http://orth.exer.us/298   Rotation    Lie on side with knees bent. Roll upper body onto back and look the opposite direction. Hold __10__ seconds. Repeat to each side Repeat __5__ times on each side. Do __2__ sessions per day.   Thoracic: Stretch - Lean Back (Chair)    Get ON TARGET. Sit on a low firm-backed chair, hands behind head. Place half roller below scapula area. Elbows leading motion, lean back, arching upper body. Avoid undue pressure or quick stretching. Hold _30__ seconds. Repeat _3__ times. Do __2-3_ sessions per day.  Copyright  VHI. All rights reserved.

## 2016-01-02 ENCOUNTER — Ambulatory Visit: Payer: No Typology Code available for payment source | Admitting: Physical Therapy

## 2016-01-02 DIAGNOSIS — M791 Myalgia: Secondary | ICD-10-CM | POA: Diagnosis not present

## 2016-01-02 DIAGNOSIS — M546 Pain in thoracic spine: Secondary | ICD-10-CM

## 2016-01-02 DIAGNOSIS — M5386 Other specified dorsopathies, lumbar region: Secondary | ICD-10-CM

## 2016-01-02 DIAGNOSIS — M5384 Other specified dorsopathies, thoracic region: Secondary | ICD-10-CM

## 2016-01-02 DIAGNOSIS — M5382 Other specified dorsopathies, cervical region: Secondary | ICD-10-CM | POA: Diagnosis not present

## 2016-01-02 DIAGNOSIS — M7918 Myalgia, other site: Secondary | ICD-10-CM

## 2016-01-02 DIAGNOSIS — R6889 Other general symptoms and signs: Secondary | ICD-10-CM | POA: Diagnosis not present

## 2016-01-02 DIAGNOSIS — M2560 Stiffness of unspecified joint, not elsewhere classified: Secondary | ICD-10-CM | POA: Diagnosis not present

## 2016-01-02 DIAGNOSIS — M545 Low back pain, unspecified: Secondary | ICD-10-CM

## 2016-01-02 DIAGNOSIS — M542 Cervicalgia: Secondary | ICD-10-CM

## 2016-01-02 DIAGNOSIS — R0781 Pleurodynia: Secondary | ICD-10-CM
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
## 2016-01-07 ENCOUNTER — Ambulatory Visit: Payer: No Typology Code available for payment source | Admitting: Physical Therapy

## 2016-01-07 DIAGNOSIS — R0781 Pleurodynia: Secondary | ICD-10-CM

## 2016-01-07 DIAGNOSIS — M2560 Stiffness of unspecified joint, not elsewhere classified: Secondary | ICD-10-CM | POA: Diagnosis not present

## 2016-01-07 DIAGNOSIS — M7918 Myalgia, other site: Secondary | ICD-10-CM

## 2016-01-07 DIAGNOSIS — M546 Pain in thoracic spine: Secondary | ICD-10-CM | POA: Diagnosis not present

## 2016-01-07 DIAGNOSIS — M791 Myalgia: Secondary | ICD-10-CM | POA: Diagnosis not present

## 2016-01-07 DIAGNOSIS — M5384 Other specified dorsopathies, thoracic region: Secondary | ICD-10-CM

## 2016-01-07 DIAGNOSIS — R6889 Other general symptoms and signs: Secondary | ICD-10-CM | POA: Diagnosis not present

## 2016-01-07 DIAGNOSIS — M545 Low back pain, unspecified: Secondary | ICD-10-CM

## 2016-01-07 DIAGNOSIS — M5386 Other specified dorsopathies, lumbar region: Secondary | ICD-10-CM

## 2016-01-07 DIAGNOSIS — M5382 Other specified dorsopathies, cervical region: Secondary | ICD-10-CM | POA: Diagnosis not present

## 2016-01-07 DIAGNOSIS — M542 Cervicalgia: Secondary | ICD-10-CM
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
## 2016-01-09 ENCOUNTER — Ambulatory Visit: Payer: No Typology Code available for payment source | Admitting: Physical Therapy

## 2016-01-09 DIAGNOSIS — R6889 Other general symptoms and signs: Secondary | ICD-10-CM

## 2016-01-09 DIAGNOSIS — M5382 Other specified dorsopathies, cervical region: Secondary | ICD-10-CM

## 2016-01-09 DIAGNOSIS — M791 Myalgia: Secondary | ICD-10-CM | POA: Diagnosis not present

## 2016-01-09 DIAGNOSIS — R0781 Pleurodynia: Secondary | ICD-10-CM | POA: Diagnosis not present

## 2016-01-09 DIAGNOSIS — M545 Low back pain, unspecified: Secondary | ICD-10-CM

## 2016-01-09 DIAGNOSIS — M5386 Other specified dorsopathies, lumbar region: Secondary | ICD-10-CM

## 2016-01-09 DIAGNOSIS — M546 Pain in thoracic spine: Secondary | ICD-10-CM

## 2016-01-09 DIAGNOSIS — M5384 Other specified dorsopathies, thoracic region: Secondary | ICD-10-CM

## 2016-01-09 DIAGNOSIS — M2560 Stiffness of unspecified joint, not elsewhere classified: Secondary | ICD-10-CM | POA: Diagnosis not present

## 2016-01-09 DIAGNOSIS — M542 Cervicalgia: Secondary | ICD-10-CM

## 2016-01-09 DIAGNOSIS — M7918 Myalgia, other site: Secondary | ICD-10-CM
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
## 2016-01-14 ENCOUNTER — Ambulatory Visit: Payer: No Typology Code available for payment source | Attending: Family Medicine | Admitting: Physical Therapy

## 2016-01-14 DIAGNOSIS — M542 Cervicalgia: Secondary | ICD-10-CM | POA: Diagnosis not present

## 2016-01-14 DIAGNOSIS — M546 Pain in thoracic spine: Secondary | ICD-10-CM

## 2016-01-14 DIAGNOSIS — R29898 Other symptoms and signs involving the musculoskeletal system: Secondary | ICD-10-CM | POA: Diagnosis not present

## 2016-01-14 DIAGNOSIS — M5440 Lumbago with sciatica, unspecified side: Secondary | ICD-10-CM

## 2016-01-14 DIAGNOSIS — M2560 Stiffness of unspecified joint, not elsewhere classified: Secondary | ICD-10-CM | POA: Insufficient documentation

## 2016-01-14 DIAGNOSIS — M436 Torticollis: Secondary | ICD-10-CM | POA: Diagnosis not present

## 2016-01-14 NOTE — Patient Instructions (Addendum)
Trigger Point Dry Needling  . What is Trigger Point Dry Needling (DN)? o DN is a physical therapy technique used to treat muscle pain and dysfunction. Specifically, DN helps deactivate muscle trigger points (muscle knots).  o A thin filiform needle is used to penetrate the skin and stimulate the underlying trigger point. The goal is for a local twitch response (LTR) to occur and for the trigger point to relax. No medication of any kind is injected during the procedure.   . What Does Trigger Point Dry Needling Feel Like?  o The procedure feels different for each individual patient. Some patients report that they do not actually feel the needle enter the skin and overall the process is not painful. Very mild bleeding may occur. However, many patients feel a deep cramping in the muscle in which the needle was inserted. This is the local twitch response.   Marland Kitchen How Will I feel after the treatment? o Soreness is normal, and the onset of soreness may not occur for a few hours. Typically this soreness does not last longer than two days.  o Bruising is uncommon, however; ice can be used to decrease any possible bruising.  o In rare cases feeling tired or nauseous after the treatment is normal. In addition, your symptoms may get worse before they get better, this period will typically not last longer than 24 hours.   . What Can I do After My Treatment? o Increase your hydration by drinking more water for the next 24 hours. o You may place ice or heat on the areas treated that have become sore, however, do not use heat on inflamed or bruised areas. Heat often brings more relief post needling. o You can continue your regular activities, but vigorous activity is not recommended initially after the treatment for 24 hours. o DN is best combined with other physical therapy such as strengthening, stretching, and other therapies.      Copyright  VHI. All rights reserved.  Functional Quadriceps: Sit to  Stand   Sit on edge of chair, feet flat on floor. Stand upright, extending knees fully. Repeat _10___ times per set. Do _1___ sets per session. Do __3__ sessions per day.  Strengthening: Wall Slide   Leaning on wall, slowly lower buttocks until thighs are parallel to floor. Hold _10__ seconds. Tighten thigh muscles and return. Repeat _10___ times per set. Do ___1_ sets per session. Do _1-2___ sessions per day.  Voncille Lo, PT 01/14/2016 12:06 PM Phone: [phone] Fax: [phone]
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
## 2016-01-16 ENCOUNTER — Ambulatory Visit: Payer: No Typology Code available for payment source | Admitting: Physical Therapy

## 2016-01-16 DIAGNOSIS — M436 Torticollis: Secondary | ICD-10-CM | POA: Diagnosis not present

## 2016-01-16 DIAGNOSIS — M2560 Stiffness of unspecified joint, not elsewhere classified: Secondary | ICD-10-CM | POA: Diagnosis not present

## 2016-01-16 DIAGNOSIS — M542 Cervicalgia: Secondary | ICD-10-CM | POA: Diagnosis not present

## 2016-01-16 DIAGNOSIS — M5440 Lumbago with sciatica, unspecified side: Secondary | ICD-10-CM | POA: Diagnosis not present

## 2016-01-16 DIAGNOSIS — M546 Pain in thoracic spine: Secondary | ICD-10-CM | POA: Diagnosis not present

## 2016-01-16 DIAGNOSIS — R29898 Other symptoms and signs involving the musculoskeletal system: Secondary | ICD-10-CM | POA: Diagnosis not present

## 2016-01-16 NOTE — Therapy (Signed)
[place], [place], [address] Phone: [phone]   Fax:  [phone]  Physical Therapy Treatment  Patient Details  Name: Guy Moore MRN: [id number] Date of Birth: [birth year] Referring Provider: Alysia Penna MD  Encounter Date: 01/16/2016      PT End of Session - 01/16/16 1426    Visit Number 7   Number of Visits 16   Date for PT Re-Evaluation 02/18/16   Authorization Type Med Pay  Medicare  02-18-16   PT Start Time 0220   PT Stop Time 0335   PT Time Calculation (min) 75 min      Past Medical History  Diagnosis Date  . Atrial fibrillation [facility])     sees Dr. [name] at [facility] Cardiology   . Atrial flutter ([place])   . HTN (hypertension)   . Other and unspecified hyperlipidemia   . Type II or unspecified type diabetes mellitus without mention of complication, not stated as uncontrolled     Diet control   . Hyperglycemia   . BCC (basal cell carcinoma of skin)     Nose  . Knee problem     2% permanent partial impairment Right  . Gastric ulcer   . Hiatal hernia   . Allergic rhinitis   . Hearing loss     uses amplification  . Diverticulosis   . Erosive esophagitis   . Esophageal stricture   . Arthritis   . Depression   . Sleep apnea   . Hemorrhoids   . DIVERTICULOSIS, COLON 04/13/2007          Past Surgical History  Procedure Laterality Date  . Uvulopalatopharyngoplasty  1980's  . Vasectomy  0092'Z    w/complications  . Cardiac electrophysiology study and ablation      x 4 (sees Dr. [name] at [facility])  . Nose surgery      There were no vitals filed for this visit.  Visit Diagnosis:  Cervicalgia  Stiffness of neck      Subjective Assessment - 01/16/16 1425    Subjective I feel much better   Currently in Pain? Yes   Pain Score 1    Pain Location Thoracic   Aggravating Factors  always different            [facility] PT Assessment - 01/16/16 0001     AROM   Cervical Flexion 42   Cervical Extension 48   Cervical - Right Side Bend 20   Cervical - Left Side Bend 20   Cervical - Right Rotation 52   Cervical - Left Rotation 60                     [facility] Adult PT Treatment/Exercise - 01/16/16 0001    Self-Care   Self-Care Lifting   Lifting Returned demonstration of proper lifting mechanics floor waist   Moist Heat Therapy   Number Minutes Moist Heat 15 Minutes   Moist Heat Location Cervical   Manual Therapy   Soft tissue mobilization STW to bilateral scalenes, bil sterno cleido mastoid upper traps as well as PROM for side bend and rotation,    Neck Exercises: Stretches   Upper Trapezius Stretch 2 reps;30 seconds  right and left   Levator Stretch 2 reps;30 seconds  right and left   Other Neck Stretches scalenes stretch 10 sec x 5 each, SCM sretch 10 sec x 5 each  PT Short Term Goals - 01/07/16 1520    PT SHORT TERM GOAL #1   Title "Independent with initial HEP 01-21-16   Status Achieved   PT SHORT TERM GOAL #2   Title Pain in spine to be reduced from 8/10 to 4/10 with functional movement 01-21-16   Status Achieved   PT SHORT TERM GOAL #3   Title "Demonstrate understanding of proper sitting posture and be more conscious of position and posture throughout the day. 01-21-16   Status Achieved   PT SHORT TERM GOAL #4   Title increase gait velocity to at least 2.62 ft/sec for community level ambulation 01-21-16   Baseline 4.28 ft/sec   Status Achieved           PT Long Term Goals - 01/16/16 1453    PT LONG TERM GOAL #1   Title "Demonstrate and verbalize techniques to reduce the risk of re-injury including: lifting, posture, body mechanics. 02-18-16   Time 8   Period Weeks   Status On-going   PT LONG TERM GOAL #2   Title "Pt will be independent with advanced HEP including core strength and flexibility 02-18-16   Time 8   Period Weeks   Status On-going   PT LONG TERM GOAL #3   Title  "Pain will decrease to 1/10 or less with all functional activities 02-18-16   Time 8   Period Weeks   Status Achieved   PT LONG TERM GOAL #4   Title "FOTO will improve from  49%  to  33%   indicating improved functional mobility. 02-18-16   Time 8   Period Weeks   Status On-going   PT LONG TERM GOAL #5   Title Pt will be able to rise from sitting in car to standing with minimal pain and decreased UE support 02-18-16   Time 8   Period Weeks   Status Achieved   PT LONG TERM GOAL #6   Title "Pt will not wake due to pain while turning in bed while sleeping at night 02-18-16   Time 8   Period Weeks   Status Achieved   PT LONG TERM GOAL #7   Title Pt will return to 2 mile walking program without exacerbation of pain. 02-18-16   Time 8   Period Weeks   Status Achieved               Plan - 01/16/16 1451    Clinical Impression Statement Pt reports decreased rib area pain since dry needling. He demonstrtaes increased cervical AROM for flexion, extension, and side bending. Instructed pt in SCM/scalenes stretch for HEP. Discussed proper body mechanics with pt able to retrun demonstrate proper lifting floor to waist. He no longer wakes due to pain and has returned to walking 2 miles without pain increased. LTG#6, #7 MET.    PT Next Visit Plan review SCM/ scalenes stretch, dry needling, review exercises, give body mechanics handouts        Problem List Patient Active Problem List   Diagnosis Date Noted  . Thoracic scoliosis 12/16/2015  . Diabetes mellitus without complication ([place]) 62/56/3893  . Former smoker 04/12/2015  . Thrombocytopenia ([place]) 04/12/2015  . History of basal cell cancer 06/27/2014  . Shingles 03/24/2014  . Bleeding gums 04/15/2013  . Routine health maintenance 03/11/2012  . GERD (gastroesophageal reflux disease) 08/31/2011  . Anticoagulant long-term use 08/31/2011  . OBSTRUCTIVE SLEEP APNEA 07/04/2009  . Atrial fibrillation ([place]) 04/19/2009  . DM II- diet controlled  10/21/2007  . ALLERGIC RHINITIS 04/14/2007  . Hyperlipidemia 04/13/2007  . Essential hypertension 04/13/2007    Dorene Ar , PTA  01/16/2016, 3:54 PM  [facility] [address] [place], [place], [address] Phone: [phone]   Fax:  [phone]  Name: THURMOND HILDEBRAN MRN: [id number] Date of Birth: 23-Dec-1933

## 2016-01-20 ENCOUNTER — Other Ambulatory Visit: Payer: Self-pay | Admitting: Family Medicine

## 2016-01-23 ENCOUNTER — Encounter: Payer: Self-pay | Admitting: Family Medicine

## 2016-01-23 ENCOUNTER — Ambulatory Visit: Payer: No Typology Code available for payment source | Admitting: Physical Therapy

## 2016-01-23 ENCOUNTER — Telehealth: Payer: Self-pay | Admitting: Family Medicine

## 2016-01-23 DIAGNOSIS — M2560 Stiffness of unspecified joint, not elsewhere classified: Secondary | ICD-10-CM | POA: Diagnosis not present

## 2016-01-23 DIAGNOSIS — R29898 Other symptoms and signs involving the musculoskeletal system: Secondary | ICD-10-CM | POA: Diagnosis not present

## 2016-01-23 DIAGNOSIS — M5440 Lumbago with sciatica, unspecified side: Secondary | ICD-10-CM

## 2016-01-23 DIAGNOSIS — M546 Pain in thoracic spine: Secondary | ICD-10-CM | POA: Diagnosis not present

## 2016-01-23 DIAGNOSIS — M436 Torticollis: Secondary | ICD-10-CM | POA: Diagnosis not present

## 2016-01-23 DIAGNOSIS — M542 Cervicalgia: Secondary | ICD-10-CM
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
## 2016-01-23 NOTE — Telephone Encounter (Signed)
Pt is on his last PT visit therapist feels that he may have a fractured rib in the back that is causing him so much pain.  Pt would like to know if you would like for him to have an xray before he comes in for his appt next week so that you may go over it with him.

## 2016-01-23 NOTE — Telephone Encounter (Signed)
Please advise 

## 2016-01-26 NOTE — Telephone Encounter (Signed)
Let me see him first to determine the proper test to order

## 2016-01-27 NOTE — Telephone Encounter (Signed)
Patient has appt scheduled for tomorrow with Dr Sarajane Jews.

## 2016-01-28 ENCOUNTER — Encounter: Payer: Self-pay | Admitting: Family Medicine

## 2016-01-28 ENCOUNTER — Ambulatory Visit (INDEPENDENT_AMBULATORY_CARE_PROVIDER_SITE_OTHER): Payer: PRIVATE HEALTH INSURANCE | Admitting: Family Medicine

## 2016-01-28 ENCOUNTER — Ambulatory Visit (INDEPENDENT_AMBULATORY_CARE_PROVIDER_SITE_OTHER)
Admission: RE | Admit: 2016-01-28 | Discharge: 2016-01-28 | Disposition: A | Discharge status: Home / Self Care | Payer: Medicare Other | Source: Ambulatory Visit | Attending: Family Medicine | Admitting: Family Medicine

## 2016-01-28 VITALS — BP 146/80 | HR 59 | Temp 98.5°F | Ht 70.0 in | Wt 256.0 lb

## 2016-01-28 DIAGNOSIS — R0781 Pleurodynia: Secondary | ICD-10-CM

## 2016-01-28 DIAGNOSIS — M545 Low back pain, unspecified: Secondary | ICD-10-CM

## 2016-01-28 DIAGNOSIS — M546 Pain in thoracic spine: Secondary | ICD-10-CM | POA: Diagnosis not present

## 2016-01-28 NOTE — Progress Notes (Signed)
Pre visit review using our clinic review tool, if applicable. No additional management support is needed unless otherwise documented below in the visit note. 
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
## 2016-02-02 ENCOUNTER — Ambulatory Visit
Admission: RE | Admit: 2016-02-02 | Discharge: 2016-02-02 | Disposition: A | Discharge status: Home / Self Care | Payer: Medicare Other | Source: Ambulatory Visit | Attending: Family Medicine | Admitting: Family Medicine

## 2016-02-02 DIAGNOSIS — M546 Pain in thoracic spine: Secondary | ICD-10-CM

## 2016-02-02 DIAGNOSIS — M545 Low back pain, unspecified: Secondary | ICD-10-CM

## 2016-02-02 DIAGNOSIS — M4806 Spinal stenosis, lumbar region: Secondary | ICD-10-CM | POA: Diagnosis not present

## 2016-02-02 DIAGNOSIS — M5124 Other intervertebral disc displacement, thoracic region: Secondary | ICD-10-CM | POA: Diagnosis not present

## 2016-02-04 NOTE — Addendum Note (Signed)
Addended by: Alysia Penna A on: 02/04/2016 11:10 PM   Modules accepted: Orders

## 2016-02-14 ENCOUNTER — Ambulatory Visit (INDEPENDENT_AMBULATORY_CARE_PROVIDER_SITE_OTHER): Payer: Medicare Other | Admitting: Family Medicine

## 2016-02-14 ENCOUNTER — Encounter: Payer: Self-pay | Admitting: Family Medicine

## 2016-02-14 VITALS — BP 138/78 | HR 61 | Temp 99.2°F | Ht 70.0 in | Wt 256.0 lb

## 2016-02-14 DIAGNOSIS — K5732 Diverticulitis of large intestine without perforation or abscess without bleeding: Secondary | ICD-10-CM

## 2016-02-14 MED ORDER — METRONIDAZOLE 500 MG PO TABS
500.0000 mg | ORAL_TABLET | Freq: Three times a day (TID) | ORAL | Status: DC
Start: 2016-02-14 — End: 2016-08-04

## 2016-02-14 MED ORDER — CIPROFLOXACIN HCL 500 MG PO TABS: 500.0000 mg | ORAL_TABLET | Freq: Two times a day (BID) | ORAL | Status: DC

## 2016-02-14 NOTE — Progress Notes (Signed)
   Subjective:    Patient ID: Guy Moore, male    DOB: [birth year], 82 y.o.   MRN: [id number]  HPI Here for 3 days of fever to 101 degrees, loose stools, and LLQ pain. No urinary symptoms. No URI symptoms. Drinking fluids, appetite is down but he had soup and crackers last night. Of note he is scheduled to see Dr. Carloyn Manner for his lumbar stenosis on 02-21-16.   Review of Systems  Constitutional: Positive for fever. Negative for chills and diaphoresis.  Respiratory: Negative.   Cardiovascular: Negative.   Gastrointestinal: Positive for abdominal pain and diarrhea. Negative for nausea, vomiting, constipation, blood in stool, abdominal distention, anal bleeding and rectal pain.  Genitourinary: Negative.        Objective:   Physical Exam  Constitutional: He appears well-developed and well-nourished. No distress.  Cardiovascular: Normal rate, regular rhythm, normal heart sounds and intact distal pulses.   Pulmonary/Chest: Effort normal and breath sounds normal.  Abdominal: Soft. Bowel sounds are normal. He exhibits no distension and no mass. There is no rebound and no guarding.  Tender in the LLQ           Assessment & Plan:  Diverticulitis, treat with Cipro and Flagyl.  Laurey Morale, MD

## 2016-02-14 NOTE — Progress Notes (Signed)
Pre visit review using our clinic review tool, if applicable. No additional management support is needed unless otherwise documented below in the visit note. 

## 2016-02-21 ENCOUNTER — Encounter: Payer: Self-pay | Admitting: Physical Therapy

## 2016-02-21 DIAGNOSIS — S39012A Strain of muscle, fascia and tendon of lower back, initial encounter: Secondary | ICD-10-CM | POA: Diagnosis not present

## 2016-02-21 DIAGNOSIS — M4806 Spinal stenosis, lumbar region: Secondary | ICD-10-CM | POA: Diagnosis not present

## 2016-02-21 DIAGNOSIS — M4716 Other spondylosis with myelopathy, lumbar region: Secondary | ICD-10-CM | POA: Diagnosis not present

## 2016-02-27 ENCOUNTER — Ambulatory Visit: Payer: No Typology Code available for payment source | Attending: Family Medicine | Admitting: Physical Therapy

## 2016-02-27 DIAGNOSIS — M5440 Lumbago with sciatica, unspecified side: Secondary | ICD-10-CM | POA: Insufficient documentation

## 2016-02-27 DIAGNOSIS — M542 Cervicalgia: Secondary | ICD-10-CM | POA: Diagnosis not present

## 2016-02-27 DIAGNOSIS — M6281 Muscle weakness (generalized): Secondary | ICD-10-CM | POA: Insufficient documentation

## 2016-02-27 DIAGNOSIS — M546 Pain in thoracic spine: Secondary | ICD-10-CM

## 2016-02-27 DIAGNOSIS — R29898 Other symptoms and signs involving the musculoskeletal system: Secondary | ICD-10-CM | POA: Diagnosis not present

## 2016-02-27 DIAGNOSIS — M436 Torticollis: Secondary | ICD-10-CM
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
## 2016-02-27 NOTE — Patient Instructions (Addendum)
   PELVIC TILT  Lie on back, legs bent. Exhale, tilting top of pelvis back, pubic bone up, to flatten lower back. Inhale, rolling pelvis opposite way, top forward, pubic bone down, arch in back. Repeat __10__ times. Do __2__ sessions per day. Copyright  VHI. All rights reserved.    Isometric Hold With Pelvic Floor (Hook-Lying)  Lie with hips and knees bent. Slowly inhale, and then exhale. Pull navel toward spine and tighten pelvic floor. Hold for __10_ seconds. Continue to breathe in and out during hold. Rest for _10__ seconds. Repeat __10_ times. Do __2-3_ times a day.   Knee Fold  Lie on back, legs bent, arms by sides. Exhale, lifting knee to chest. Inhale, returning. Keep abdominals flat, navel to spine. Repeat __10__ times, alternating legs. Do __2__ sessions per day.  Knee Drop  Keep pelvis stable. Without rotating hips, slowly drop knee to side, pause, return to center, bring knee across midline toward opposite hip. Feel obliques engaging. Repeat for ___10_ times each leg.   Copyright  VHI. All rights reserved.       Heel Slide to Straight   Slide one leg down to straight. Return. Be sure pelvis does not rock forward, tilt, rotate, or tip to side. Do _10__ times. Restabilize pelvis. Repeat with other leg. Do __1-2_ sets, __2_ times per day.  Remember due to your stenosis in your back , you may find you have an easier time walking up at incline or riding a bike. More in a flexed position. dont stay in any one position for long.  Voncille Lo, PT 02/27/2016 1:02 PM Phone: [phone] Fax: [phone]

## 2016-03-02 ENCOUNTER — Ambulatory Visit: Payer: No Typology Code available for payment source | Admitting: Physical Therapy

## 2016-03-02 DIAGNOSIS — R29898 Other symptoms and signs involving the musculoskeletal system: Secondary | ICD-10-CM

## 2016-03-02 DIAGNOSIS — M6281 Muscle weakness (generalized): Secondary | ICD-10-CM | POA: Diagnosis not present

## 2016-03-02 DIAGNOSIS — M436 Torticollis: Secondary | ICD-10-CM | POA: Diagnosis not present

## 2016-03-02 DIAGNOSIS — M542 Cervicalgia: Secondary | ICD-10-CM | POA: Diagnosis not present

## 2016-03-02 DIAGNOSIS — M5440 Lumbago with sciatica, unspecified side: Secondary | ICD-10-CM | POA: Diagnosis not present

## 2016-03-02 DIAGNOSIS — M546 Pain in thoracic spine: Secondary | ICD-10-CM

## 2016-03-02 NOTE — Therapy (Signed)
[place], [place], [address] Phone: [phone]   Fax:  [phone]  Physical Therapy Treatment  Patient Details  Name: Guy Moore MRN: [id number] Date of Birth: July 16, 1934 Referring Provider: Alysia Penna MD  Encounter Date: 03/02/2016      PT End of Session - 03/02/16 1035    Visit Number 10   Number of Visits 17   Date for PT Re-Evaluation 03/26/16   Authorization Type Med Pay    PT Start Time 1020   PT Stop Time 1103   PT Time Calculation (min) 43 min      Past Medical History  Diagnosis Date  . Atrial fibrillation [facility])     sees Dr. [name] at [facility]   . Atrial flutter ([place])   . HTN (hypertension)   . Other and unspecified hyperlipidemia   . Type II or unspecified type diabetes mellitus without mention of complication, not stated as uncontrolled     Diet control   . Hyperglycemia   . BCC (basal cell carcinoma of skin)     Nose  . Knee problem     2% permanent partial impairment Right  . Gastric ulcer   . Hiatal hernia   . Allergic rhinitis   . Hearing loss     uses amplification  . Diverticulosis   . Erosive esophagitis   . Esophageal stricture   . Arthritis   . Depression   . Sleep apnea   . Hemorrhoids   . DIVERTICULOSIS, COLON 04/13/2007          Past Surgical History  Procedure Laterality Date  . Uvulopalatopharyngoplasty  1980's  . Vasectomy  [id number]    w/complications  . Cardiac electrophysiology study and ablation      x 4 (sees Dr. [name] at [facility])  . Nose surgery      There were no vitals filed for this visit.      Subjective Assessment - 03/02/16 1035    Subjective Can have days without pain.    Currently in Pain? No/denies                         [facility] Adult PT Treatment/Exercise - 03/02/16 0001    Lumbar Exercises: Stretches   Pelvic Tilt 10 seconds   Lumbar Exercises: Supine   Clam 10  reps;3 seconds  right and left VC for stable pelvis   Heel Slides 10 reps  right and left VC for stable pelvis   Bent Knee Raise 10 reps;3 seconds   Knee/Hip Exercises: Aerobic   Tread Mill 1.1 mph 10 minutes HR 74bpm, 10 minutes = .18 miles                  PT Short Term Goals - 01/07/16 1520    PT SHORT TERM GOAL #1   Title "Independent with initial HEP 01-21-16   Status Achieved   PT SHORT TERM GOAL #2   Title Pain in spine to be reduced from 8/10 to 4/10 with functional movement 01-21-16   Status Achieved   PT SHORT TERM GOAL #3   Title "Demonstrate understanding of proper sitting posture and be more conscious of position and posture throughout the day. 01-21-16   Status Achieved   PT SHORT TERM GOAL #4   Title increase gait velocity to at least 2.62 ft/sec for community level ambulation 01-21-16   Baseline 4.28 ft/sec  Status Achieved           PT Long Term Goals - 02/27/16 1242    PT LONG TERM GOAL #1   Title "Demonstrate and verbalize techniques to reduce the risk of re-injury including: lifting, posture, body mechanics. 03-26-16   Baseline able to verbalize 3 strategies especially working in garden and understands stenosis   Time 5   Period Weeks   Status Achieved   PT LONG TERM GOAL #2   Title "Pt will be independent with advanced HEP including core strength and flexibility and core exericise for back stenosis, flexion based 03-26-16   Time 5   Period Weeks   Status On-going   PT LONG TERM GOAL #3   Title "Pain will decrease to 1/10 or less with all functional activities 03-26-16   Baseline pain in thorax and low back 2/10   Time 5   Period Weeks   Status On-going   PT LONG TERM GOAL #4   Title "FOTO will improve from  49%  to  33%   indicating improved functional mobility. 03-26-16   Time 5   PT LONG TERM GOAL #5   Title Pt will be able to rise from sitting in car to standing with minimal pain and decreased UE support 03-26-16   Baseline 14 sit to  stand without UE support   Time 5   PT LONG TERM GOAL #6   Title "Pt will not wake due to pain while turning in bed while sleeping at night 03-26-16   Baseline 1-2 x a night   Time 5   Period Weeks   Status On-going   PT LONG TERM GOAL #7   Title Pt will return to 2 mile walking program without exacerbation of pain. 03-26-16   Baseline Pt only able to walk 1 mile and then rest 15 min and then return with increased pain. Pt was improving but left rib pain increased   Time 5   PT LONG TERM GOAL #8   Title Pt will be able to drive the car for vacation and drive car without stopping every 30 minutes due to pain. and use pain management strategies 03-26-16   Time 5   Period Weeks   Status Revised               Plan - 03/02/16 1046    Clinical Impression Statement Began walking on treadmill with good tolerance, no pain. .18 miles achieved in 10 minutes with no pain. Reviewed pt's pre pilates HEP with cues required for breathing and technique.    PT Next Visit Plan Work on [place] Flexion and core based in flexion due to stenosis.  Get Independent in HEP and then DC when appropriate. Continue progressive TM walking    PT Home Exercise Plan pre pilates       Patient will benefit from skilled therapeutic intervention in order to improve the following deficits and impairments:  Abnormal gait, Decreased activity tolerance, Decreased mobility, Decreased strength, Decreased range of motion, Hypomobility, Postural dysfunction, Improper body mechanics, Obesity, Impaired UE functional use, Increased muscle spasms, Increased fascial restricitons, Pain  Visit Diagnosis: Low back pain with sciatica, sciatica laterality unspecified, unspecified back pain laterality  Other symptoms and signs involving the musculoskeletal system  Muscle weakness (generalized)  Cervicalgia  Left-sided thoracic back pain  Stiffness of neck     Problem List Patient Active Problem List   Diagnosis Date  Noted  . Thoracic scoliosis 12/16/2015  . Diabetes mellitus  without complication ([place]) [id number]  . Former smoker 04/12/2015  . Thrombocytopenia ([place]) 04/12/2015  . History of basal cell cancer 06/27/2014  . Shingles 03/24/2014  . Bleeding gums 04/15/2013  . Routine health maintenance 03/11/2012  . GERD (gastroesophageal reflux disease) 08/31/2011  . Anticoagulant long-term use 08/31/2011  . OBSTRUCTIVE SLEEP APNEA 07/04/2009  . Atrial fibrillation ([place]) 04/19/2009  . DM II- diet controlled 10/21/2007  . ALLERGIC RHINITIS 04/14/2007  . Hyperlipidemia 04/13/2007  . Essential hypertension 04/13/2007    Dorene Ar, PTA 03/02/2016, 11:09 AM  [facility] [address] [place], [place], [address] Phone: [phone]   Fax:  [phone]  Name: SKYELER GOODLUCK MRN: [id number] Date of Birth: 1934/06/25

## 2016-03-06 ENCOUNTER — Ambulatory Visit: Payer: No Typology Code available for payment source | Admitting: Physical Therapy

## 2016-03-06 DIAGNOSIS — M5440 Lumbago with sciatica, unspecified side: Secondary | ICD-10-CM | POA: Diagnosis not present

## 2016-03-06 DIAGNOSIS — R29898 Other symptoms and signs involving the musculoskeletal system: Secondary | ICD-10-CM

## 2016-03-06 DIAGNOSIS — M542 Cervicalgia: Secondary | ICD-10-CM

## 2016-03-06 DIAGNOSIS — M6281 Muscle weakness (generalized): Secondary | ICD-10-CM

## 2016-03-06 DIAGNOSIS — M546 Pain in thoracic spine: Secondary | ICD-10-CM | POA: Diagnosis not present

## 2016-03-06 DIAGNOSIS — M436 Torticollis: Secondary | ICD-10-CM | POA: Diagnosis not present

## 2016-03-06 NOTE — Patient Instructions (Signed)
Bridge    Lie back, legs bent. Inhale, pressing hips up. Keeping ribs in, lengthen lower back. Exhale, rolling down along spine from top. Repeat __10__ times. Do ___2_ sessions per day.      Straight Leg Raise    Tighten stomach and slowly raise locked right leg __12__ inches from floor. Repeat ___10_ times per set. Repeat on each leg.  Do __2__ sets per session. Do __2__ sessions per day.  Abduction: Clam (Eccentric) - Side-Lying    Lie on side with knees bent. Lift top knee, keeping feet together. Keep trunk steady. Slowly lower for 3-5 seconds. ___ reps per set, ___ sets per day, ___ days per week.  Abduction: Side Leg Lift (Eccentric) - Side-Lying    Lie on side. Lift top leg slightly higher than shoulder level. Keep top leg straight with body, toes pointing forward. Slowly lower for 3-5 seconds. __10_ reps per set, _2__ sets per day.

## 2016-03-06 NOTE — Therapy (Signed)
[place], [place], [address] Phone: [phone]   Fax:  [phone]  Physical Therapy Treatment  Patient Details  Name: Guy Moore MRN: [id number] Date of Birth: October 02, 1934 Referring Provider: Alysia Penna MD  Encounter Date: 03/06/2016      PT End of Session - 03/06/16 0937    Visit Number 11   Number of Visits 17   Date for PT Re-Evaluation 03/26/16   Authorization Type Med Pay    PT Start Time 0932   PT Stop Time 1012   PT Time Calculation (min) 40 min      Past Medical History  Diagnosis Date  . Atrial fibrillation [facility])     sees Dr. [name] at [facility] Cardiology   . Atrial flutter ([place])   . HTN (hypertension)   . Other and unspecified hyperlipidemia   . Type II or unspecified type diabetes mellitus without mention of complication, not stated as uncontrolled     Diet control   . Hyperglycemia   . BCC (basal cell carcinoma of skin)     Nose  . Knee problem     2% permanent partial impairment Right  . Gastric ulcer   . Hiatal hernia   . Allergic rhinitis   . Hearing loss     uses amplification  . Diverticulosis   . Erosive esophagitis   . Esophageal stricture   . Arthritis   . Depression   . Sleep apnea   . Hemorrhoids   . DIVERTICULOSIS, COLON 04/13/2007          Past Surgical History  Procedure Laterality Date  . Uvulopalatopharyngoplasty  1980's  . Vasectomy  [id number]    w/complications  . Cardiac electrophysiology study and ablation      x 4 (sees Dr. [name] at [facility])  . Nose surgery      There were no vitals filed for this visit.      Subjective Assessment - 03/06/16 0943    Subjective I tried my 2 mile walk and I experienced calf pain/strain after 2/3 of the way. Had to sit and rest. Was sore and stiff the next day but no back pain or hip pain.    Currently in Pain? No/denies                         [facility] Adult PT  Treatment/Exercise - 03/06/16 0001    Lumbar Exercises: Aerobic   Stationary Bike Nustep L5 x 10 min   Lumbar Exercises: Supine   Clam 10 reps;3 seconds  right and left VC for stable pelvis   Heel Slides 10 reps  right and left VC for stable pelvis   Bent Knee Raise 10 reps;3 seconds   Bridge 10 reps   Straight Leg Raise 10 reps   Straight Leg Raises Limitations Rt, LT    Knee/Hip Exercises: Sidelying   Hip ABduction Strengthening;Right;Left;10 reps   Clams x 10    Ankle Exercises: Stretches   Slant Board Stretch 2 reps;60 seconds                PT Education - 03/06/16 1012    Education provided Yes   Education Details Hip strength HEP   Person(s) Educated Patient   Methods Explanation;Handout   Comprehension Verbalized understanding          PT Short Term Goals - 01/07/16 1520    PT SHORT TERM GOAL #  1   Title "Independent with initial HEP 01-21-16   Status Achieved   PT SHORT TERM GOAL #2   Title Pain in spine to be reduced from 8/10 to 4/10 with functional movement 01-21-16   Status Achieved   PT SHORT TERM GOAL #3   Title "Demonstrate understanding of proper sitting posture and be more conscious of position and posture throughout the day. 01-21-16   Status Achieved   PT SHORT TERM GOAL #4   Title increase gait velocity to at least 2.62 ft/sec for community level ambulation 01-21-16   Baseline 4.28 ft/sec   Status Achieved           PT Long Term Goals - 03/06/16 [id number]    PT LONG TERM GOAL #1   Title "Demonstrate and verbalize techniques to reduce the risk of re-injury including: lifting, posture, body mechanics. 03-26-16   Status Achieved   PT LONG TERM GOAL #2   Title "Pt will be independent with advanced HEP including core strength and flexibility and core exericise for back stenosis, flexion based 03-26-16   Time 5   Period Weeks   Status On-going   PT LONG TERM GOAL #3   Title "Pain will decrease to 1/10 or less with all functional activities  03-26-16   Time 5   Period Weeks   Status On-going   PT LONG TERM GOAL #4   Title "FOTO will improve from  49%  to  33%   indicating improved functional mobility. 03-26-16   Time 5   Period Weeks   Status On-going   PT LONG TERM GOAL #5   Title Pt will be able to rise from sitting in car to standing with minimal pain and decreased UE support 03-26-16   Baseline 14 sit to stand without UE support   Status Achieved   PT LONG TERM GOAL #6   Title "Pt will not wake due to pain while turning in bed while sleeping at night 03-26-16   Baseline 1-2 x a night   Time 5   Period Weeks   Status On-going   PT LONG TERM GOAL #7   Title Pt will return to 2 mile walking program without exacerbation of pain. 03-26-16   Baseline able to walk 1.3 miles without increased pain in back or rib    Time 5   Period Weeks   Status On-going   PT LONG TERM GOAL #8   Title Pt will be able to drive the car for vacation and drive car without stopping every 30 minutes due to pain. and use pain management strategies 03-26-16   Baseline 20-30 minutes and rib begins to hurt   Time 5   Period Weeks   Status On-going               Plan - 03/06/16 [id number]    Clinical Impression Statement Pt reports increased left calf pain after return to walking 2 miles. Instructed pt in calf stretching on slant board and asked him to stretch his gastroc before and after walking. Review of supine core stabilization with minimal cues for TA contract. Pt reports feeling unsteady on feet after he stands up from sitting position. He feels weak and fatigue in his hips after prolonged walking. Instructed pt in hip strengthening on mat and updated HEP.    PT Next Visit Plan Work on [place] Flexion and core based in flexion due to stenosis. Review Hip strengthening HEP. Get Independent in HEP and then DC when  appropriate      Patient will benefit from skilled therapeutic intervention in order to improve the following deficits and  impairments:  Abnormal gait, Decreased activity tolerance, Decreased mobility, Decreased strength, Decreased range of motion, Hypomobility, Postural dysfunction, Improper body mechanics, Obesity, Impaired UE functional use, Increased muscle spasms, Increased fascial restricitons, Pain  Visit Diagnosis: Low back pain with sciatica, sciatica laterality unspecified, unspecified back pain laterality  Other symptoms and signs involving the musculoskeletal system  Muscle weakness (generalized)  Cervicalgia     Problem List Patient Active Problem List   Diagnosis Date Noted  . Thoracic scoliosis 12/16/2015  . Diabetes mellitus without complication ([place]) [id number]  . Former smoker 04/12/2015  . Thrombocytopenia ([place]) 04/12/2015  . History of basal cell cancer 06/27/2014  . Shingles 03/24/2014  . Bleeding gums 04/15/2013  . Routine health maintenance 03/11/2012  . GERD (gastroesophageal reflux disease) 08/31/2011  . Anticoagulant long-term use 08/31/2011  . OBSTRUCTIVE SLEEP APNEA 07/04/2009  . Atrial fibrillation ([place]) 04/19/2009  . DM II- diet controlled 10/21/2007  . ALLERGIC RHINITIS 04/14/2007  . Hyperlipidemia 04/13/2007  . Essential hypertension 04/13/2007    Dorene Ar, PTA 03/06/2016, 10:12 AM  [facility] [address] [place], [place], [address] Phone: [phone]   Fax:  [phone]  Name: Guy Moore MRN: [id number] Date of Birth: 07/30/1934

## 2016-03-17 ENCOUNTER — Encounter: Payer: Self-pay | Admitting: Physical Therapy

## 2016-03-17 ENCOUNTER — Ambulatory Visit: Payer: No Typology Code available for payment source | Attending: Family Medicine | Admitting: Physical Therapy

## 2016-03-17 DIAGNOSIS — M6281 Muscle weakness (generalized): Secondary | ICD-10-CM | POA: Diagnosis present

## 2016-03-17 DIAGNOSIS — R29898 Other symptoms and signs involving the musculoskeletal system: Secondary | ICD-10-CM | POA: Insufficient documentation

## 2016-03-17 DIAGNOSIS — M5440 Lumbago with sciatica, unspecified side: Secondary | ICD-10-CM | POA: Diagnosis present

## 2016-03-17 NOTE — Therapy (Signed)
[place], [place], [address] Phone: [phone]   Fax:  [phone]  Physical Therapy Treatment  Patient Details  Name: Guy Moore MRN: [id number] Date of Birth: 10-Aug-1934 Referring Provider: Alysia Penna MD  Encounter Date: 03/17/2016      PT End of Session - 03/17/16 1429    Visit Number 12   Number of Visits 17   Date for PT Re-Evaluation 03/26/16   Authorization Type Med Pay    Authorization Time Period 03-26-16   PT Start Time 0215   PT Stop Time 0300   PT Time Calculation (min) 45 min      Past Medical History  Diagnosis Date  . Atrial fibrillation [facility]     sees Dr. [name] at [facility] Cardiology   . Atrial flutter ([place])   . HTN (hypertension)   . Other and unspecified hyperlipidemia   . Type II or unspecified type diabetes mellitus without mention of complication, not stated as uncontrolled     Diet control   . Hyperglycemia   . BCC (basal cell carcinoma of skin)     Nose  . Knee problem     2% permanent partial impairment Right  . Gastric ulcer   . Hiatal hernia   . Allergic rhinitis   . Hearing loss     uses amplification  . Diverticulosis   . Erosive esophagitis   . Esophageal stricture   . Arthritis   . Depression   . Sleep apnea   . Hemorrhoids   . DIVERTICULOSIS, COLON 04/13/2007          Past Surgical History  Procedure Laterality Date  . Uvulopalatopharyngoplasty  1980's  . Vasectomy  6384'Y    w/complications  . Cardiac electrophysiology study and ablation      x 4 (sees Dr. [name] at [facility])  . Nose surgery      There were no vitals filed for this visit.      Subjective Assessment - 03/17/16 1418    Subjective I had to sit in ER for  hours and I was okay then but I woke up that night in alot of pain in my rib area. I was on the right side which I dont sleep on that much. But it has never been that bad.    Currently in Pain?  No/denies            [facility] PT Assessment - 03/17/16 0001    Observation/Other Assessments   Focus on Therapeutic Outcomes (FOTO)  46% limited   Strength   Right Hip Flexion 4/5   Right Hip ABduction 4/5   Left Hip Flexion 4/5   Left Hip ABduction 4+/5                     [facility] Adult PT Treatment/Exercise - 03/17/16 0001    Lumbar Exercises: Supine   Bent Knee Raise 10 reps;3 seconds   Bent Knee Raise Limitations progressed to LEvel 2 x 10 x 2   Bridge 10 reps   Bridge Limitations then 10 reps 10 seconds, the x 10 on ball for 5 sec each with and without UE assist   Straight Leg Raise 20 reps   Straight Leg Raises Limitations Rt, LT    Knee/Hip Exercises: Sidelying   Hip ABduction Both;20 reps                  PT Short Term  Goals - 01/07/16 1520    PT SHORT TERM GOAL #1   Title "Independent with initial HEP 01-21-16   Status Achieved   PT SHORT TERM GOAL #2   Title Pain in spine to be reduced from 8/10 to 4/10 with functional movement 01-21-16   Status Achieved   PT SHORT TERM GOAL #3   Title "Demonstrate understanding of proper sitting posture and be more conscious of position and posture throughout the day. 01-21-16   Status Achieved   PT SHORT TERM GOAL #4   Title increase gait velocity to at least 2.62 ft/sec for community level ambulation 01-21-16   Baseline 4.28 ft/sec   Status Achieved           PT Long Term Goals - 03/17/16 1426    PT LONG TERM GOAL #1   Title "Demonstrate and verbalize techniques to reduce the risk of re-injury including: lifting, posture, body mechanics. 03-26-16   Time 5   Period Weeks   Status Achieved   PT LONG TERM GOAL #2   Title "Pt will be independent with advanced HEP including core strength and flexibility and core exericise for back stenosis, flexion based 03-26-16   Time 5   Period Weeks   Status On-going   PT LONG TERM GOAL #3   Title "Pain will decrease to 1/10 or less with all functional activities  03-26-16   Time 5   Period Weeks   Status Partially Met   PT LONG TERM GOAL #4   Title "FOTO will improve from  49%  to  33%   indicating improved functional mobility. 03-26-16   Time 5   Period Weeks   Status On-going   PT LONG TERM GOAL #5   Title Pt will be able to rise from sitting in car to standing with minimal pain and decreased UE support 03-26-16   Time 5   Period Weeks   Status Achieved   PT LONG TERM GOAL #6   Title "Pt will not wake due to pain while turning in bed while sleeping at night 03-26-16   Baseline 1-2 x a night   Time 5   Period Weeks   Status On-going   PT LONG TERM GOAL #7   Title Pt will return to 2 mile walking program without exacerbation of pain. 03-26-16   Baseline walking 2 miles with 2 rest breaks ( up to 10 minutes)   Time 5   Period Weeks   Status Partially Met   PT LONG TERM GOAL #8   Title Pt will be able to drive the car for vacation and drive car without stopping every 30 minutes due to pain. and use pain management strategies 03-26-16   Baseline 20-30 minutes and rib begins to hurt   Time 5   Period Weeks   Status On-going               Plan - 03/17/16 1423    Clinical Impression Statement Pain up to 2-3/10 LBP with pressure washing overhead that lasted for 1 hour.  Otherwise no pain with normal functional activities except the recent rib pain exacerbation.  He reports LBP is much improved over last 2 weeks however the rib pain is still limiting. He has been walking up to 2 miles but requires rest breaks due to foot pain. He has since purchased new shoes and that pain has resolved. He plans to try walking again soon. Foto improved and hip strength improved. Pt will attempt  return to walking 2 miles with new shoes and will likely DC to HEP if no difficulty.    PT Next Visit Plan Work on [place] Flexion and core based in flexion due to stenosis. Review Hip strengthening HEP. Get Independent in HEP and then DC when appropriate       Patient will benefit from skilled therapeutic intervention in order to improve the following deficits and impairments:  Abnormal gait, Decreased activity tolerance, Decreased mobility, Decreased strength, Decreased range of motion, Hypomobility, Postural dysfunction, Improper body mechanics, Obesity, Impaired UE functional use, Increased muscle spasms, Increased fascial restricitons, Pain  Visit Diagnosis: Low back pain with sciatica, sciatica laterality unspecified, unspecified back pain laterality  Other symptoms and signs involving the musculoskeletal system  Muscle weakness (generalized)     Problem List Patient Active Problem List   Diagnosis Date Noted  . Thoracic scoliosis 12/16/2015  . Diabetes mellitus without complication ([place]) 83/29/1916  . Former smoker 04/12/2015  . Thrombocytopenia ([place]) 04/12/2015  . History of basal cell cancer 06/27/2014  . Shingles 03/24/2014  . Bleeding gums 04/15/2013  . Routine health maintenance 03/11/2012  . GERD (gastroesophageal reflux disease) 08/31/2011  . Anticoagulant long-term use 08/31/2011  . OBSTRUCTIVE SLEEP APNEA 07/04/2009  . Atrial fibrillation ([place]) 04/19/2009  . DM II- diet controlled 10/21/2007  . ALLERGIC RHINITIS 04/14/2007  . Hyperlipidemia 04/13/2007  . Essential hypertension 04/13/2007    Dorene Ar, PTA 03/17/2016, 3:03 PM  [facility] [address] [place], [place], [address] Phone: [phone]   Fax:  [phone]  Name: Guy Moore MRN: [id number] Date of Birth: 22-Sep-1934

## 2016-03-24 ENCOUNTER — Ambulatory Visit: Payer: No Typology Code available for payment source | Admitting: Physical Therapy

## 2016-03-24 DIAGNOSIS — M6281 Muscle weakness (generalized): Secondary | ICD-10-CM

## 2016-03-24 DIAGNOSIS — M5440 Lumbago with sciatica, unspecified side: Secondary | ICD-10-CM | POA: Diagnosis not present

## 2016-03-24 DIAGNOSIS — R29898 Other symptoms and signs involving the musculoskeletal system: Secondary | ICD-10-CM
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
## 2016-03-26 ENCOUNTER — Encounter: Payer: Self-pay | Admitting: Physical Therapy

## 2016-03-26 DIAGNOSIS — I480 Paroxysmal atrial fibrillation: Secondary | ICD-10-CM | POA: Diagnosis not present

## 2016-03-31 ENCOUNTER — Encounter: Payer: Self-pay | Admitting: Physical Therapy

## 2016-04-02 ENCOUNTER — Encounter: Payer: Self-pay | Admitting: Physical Therapy

## 2016-04-03 DIAGNOSIS — H353112 Nonexudative age-related macular degeneration, right eye, intermediate dry stage: Secondary | ICD-10-CM | POA: Diagnosis not present

## 2016-04-07 ENCOUNTER — Encounter: Payer: Self-pay | Admitting: Physical Therapy

## 2016-04-09 ENCOUNTER — Encounter: Payer: Self-pay | Admitting: Physical Therapy

## 2016-04-15 ENCOUNTER — Encounter: Payer: Self-pay | Admitting: Physical Therapy

## 2016-04-17 ENCOUNTER — Encounter: Payer: Self-pay | Admitting: Physical Therapy

## 2016-04-19 ENCOUNTER — Other Ambulatory Visit: Payer: Self-pay | Admitting: Family Medicine

## 2016-04-20 NOTE — Telephone Encounter (Signed)
Refill sent to pharmacy.   

## 2016-05-12 DIAGNOSIS — M4806 Spinal stenosis, lumbar region: Secondary | ICD-10-CM | POA: Diagnosis not present

## 2016-05-12 DIAGNOSIS — S39012A Strain of muscle, fascia and tendon of lower back, initial encounter: Secondary | ICD-10-CM | POA: Diagnosis not present

## 2016-05-12 DIAGNOSIS — M4716 Other spondylosis with myelopathy, lumbar region: Secondary | ICD-10-CM | POA: Diagnosis not present

## 2016-05-29 ENCOUNTER — Telehealth: Payer: Self-pay | Admitting: Family Medicine

## 2016-05-29 DIAGNOSIS — R739 Hyperglycemia, unspecified: Secondary | ICD-10-CM | POA: Insufficient documentation

## 2016-05-29 NOTE — Telephone Encounter (Signed)
Opened in error

## 2016-05-29 NOTE — Telephone Encounter (Signed)
I deleted all references to "diabetes" from his problem list and replaced them with "hyperglycemia".

## 2016-05-29 NOTE — Telephone Encounter (Signed)
I sent pt a my chart message with below information.  

## 2016-05-29 NOTE — Telephone Encounter (Signed)
I spoke with pt and gave message, he is not a diabetic, there are no results to give this type of diagnosis. Pt would like to know if if should be left in his chart or removed from problem list. Can you please advise?

## 2016-06-09 ENCOUNTER — Other Ambulatory Visit: Payer: Self-pay

## 2016-06-14 ENCOUNTER — Other Ambulatory Visit: Payer: Self-pay | Admitting: Family Medicine
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
## 2016-07-03 ENCOUNTER — Other Ambulatory Visit: Payer: Self-pay | Admitting: Family Medicine

## 2016-07-19 ENCOUNTER — Other Ambulatory Visit: Payer: Self-pay | Admitting: Family Medicine

## 2016-08-02 ENCOUNTER — Other Ambulatory Visit: Payer: Self-pay | Admitting: Family Medicine

## 2016-08-04 ENCOUNTER — Telehealth: Payer: Self-pay | Admitting: Pulmonary Disease

## 2016-08-04 ENCOUNTER — Encounter: Payer: Self-pay | Admitting: Pulmonary Disease

## 2016-08-04 ENCOUNTER — Ambulatory Visit (INDEPENDENT_AMBULATORY_CARE_PROVIDER_SITE_OTHER): Payer: Medicare Other | Admitting: Pulmonary Disease

## 2016-08-04 VITALS — BP 124/80 | HR 75 | Ht 70.0 in | Wt 259.0 lb

## 2016-08-04 DIAGNOSIS — G4733 Obstructive sleep apnea (adult) (pediatric): Secondary | ICD-10-CM | POA: Diagnosis not present

## 2016-08-04 DIAGNOSIS — Z9989 Dependence on other enabling machines and devices: Secondary | ICD-10-CM

## 2016-08-04 DIAGNOSIS — J4540 Moderate persistent asthma, uncomplicated: Secondary | ICD-10-CM | POA: Diagnosis not present

## 2016-08-04 DIAGNOSIS — R05 Cough: Secondary | ICD-10-CM | POA: Diagnosis not present

## 2016-08-04 DIAGNOSIS — R059 Cough, unspecified: Secondary | ICD-10-CM

## 2016-08-04 LAB — NITRIC OXIDE: Nitric Oxide: 50

## 2016-08-04 MED ORDER — FLUTICASONE FUROATE 100 MCG/ACT IN AEPB: 1.0000 | INHALATION_SPRAY | Freq: Every day | RESPIRATORY_TRACT | 3 refills | Status: DC

## 2016-08-04 NOTE — Patient Instructions (Signed)
Arnuity one puff daily >> rinse mouth after each use  Follow up in 3 weeks with Dr. Halford Chessman or Nurse practitioner

## 2016-08-04 NOTE — Telephone Encounter (Signed)
lmtcb x1 for pt. 
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
## 2016-08-05 MED ORDER — FLUTICASONE PROPIONATE (INHAL) 50 MCG/BLIST IN AEPB: 1.0000 | INHALATION_SPRAY | Freq: Two times a day (BID) | RESPIRATORY_TRACT | 5 refills | Status: DC

## 2016-08-05 NOTE — Telephone Encounter (Signed)
Flovent diskus 1 puff bid.

## 2016-08-05 NOTE — Telephone Encounter (Signed)
Spoke with pt. He is aware of medication change. Rx has been sent in. Nothing further was needed.

## 2016-08-05 NOTE — Telephone Encounter (Signed)
lmtcb x2 for pt. 

## 2016-08-05 NOTE — Telephone Encounter (Signed)
Arnuity medication prescribed 08/04/16 is not in formulary of [employer] - needs an alternative.  [place] Diskus or HFA is the alternative. Please advise Dr Halford Chessman. Thanks.

## 2016-08-12 ENCOUNTER — Encounter: Payer: Self-pay | Admitting: Family Medicine

## 2016-08-12 ENCOUNTER — Other Ambulatory Visit: Payer: Self-pay | Admitting: Family Medicine

## 2016-08-12 MED ORDER — ESOMEPRAZOLE MAGNESIUM 40 MG PO CPDR: DELAYED_RELEASE_CAPSULE | ORAL | 0 refills | Status: DC

## 2016-08-13 ENCOUNTER — Encounter: Payer: Self-pay | Admitting: Family Medicine

## 2016-08-13 ENCOUNTER — Telehealth: Payer: Self-pay

## 2016-08-13 NOTE — Telephone Encounter (Signed)
Patient needed a PA on Nexium 40 mg capsules per insurance company. Printed form from [employer] to fill out and fax back. Form says that Nexium 40 mg capsules are not approved.  Other alternatives:  Omeprazole Pantoprazole Rabeprazole

## 2016-08-13 NOTE — Telephone Encounter (Signed)
Switch to Omeprazole 40 mg daily, send in a year supply

## 2016-08-14 MED ORDER — OMEPRAZOLE 40 MG PO CPDR: 40.0000 mg | DELAYED_RELEASE_CAPSULE | Freq: Every day | ORAL | 3 refills | Status: DC

## 2016-08-14 NOTE — Telephone Encounter (Signed)
Rx has been sent in. 

## 2016-08-18 DIAGNOSIS — S39012A Strain of muscle, fascia and tendon of lower back, initial encounter: Secondary | ICD-10-CM | POA: Diagnosis not present

## 2016-08-18 DIAGNOSIS — M4716 Other spondylosis with myelopathy, lumbar region: Secondary | ICD-10-CM | POA: Diagnosis not present

## 2016-08-26 ENCOUNTER — Ambulatory Visit (INDEPENDENT_AMBULATORY_CARE_PROVIDER_SITE_OTHER): Payer: Medicare Other | Admitting: Adult Health

## 2016-08-26 ENCOUNTER — Encounter: Payer: Self-pay | Admitting: Adult Health

## 2016-08-26 DIAGNOSIS — J4520 Mild intermittent asthma, uncomplicated: Secondary | ICD-10-CM | POA: Diagnosis not present

## 2016-08-26 DIAGNOSIS — J45909 Unspecified asthma, uncomplicated: Secondary | ICD-10-CM | POA: Insufficient documentation

## 2016-08-26 LAB — NITRIC OXIDE: Nitric Oxide: 33

## 2016-08-26 NOTE — Patient Instructions (Addendum)
Take Flovent 2 puffs Twice daily  , rinse after use.  Continue on CPAP At bedtime   Follow up Dr. Halford Chessman  In 3-4 months with PFT  and As needed

## 2016-08-26 NOTE — Progress Notes (Signed)
Subjective:    Patient ID: Guy Moore, male    DOB: 1934-08-23, 82 y.o.   MRN: [id number]  HPI 82 yo male former smoker (quit 1979)  followed for OSA on CPAP At bedtime  And suspected asthma   08/26/2016 Follow up : Cough/Asthma  Pt returns for 3 week follow up . Last ov pt with increased cough felt secondary to allergic asthma . FeNO was elevated at 50 last ov . He was started on Flovent . Says he is doing better with resolution of cough and is now able to take in deep breath without coughing .  FeNO 33 today  . He denies wheezing, fever, or edema.     Past Medical History:  Diagnosis Date  . Allergic rhinitis   . Arthritis   . Atrial fibrillation [facility])    sees Dr. [name] at [facility]   . Atrial flutter ([place])   . BCC (basal cell carcinoma of skin)    Nose  . Depression   . Diverticulosis   . DIVERTICULOSIS, COLON 04/13/2007       . Erosive esophagitis   . Esophageal stricture   . Gastric ulcer   . Hearing loss    uses amplification  . Hemorrhoids   . Hiatal hernia   . HTN (hypertension)   . Hyperglycemia   . 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 Other and unspecified hyperlipidemia   . Sleep apnea   . Type II or unspecified type diabetes mellitus without mention of complication, not stated as uncontrolled    Diet control    Current Outpatient Prescriptions on File Prior to Visit  Medication Sig Dispense Refill  . amLODipine (NORVASC) 5 MG tablet TAKE 1 TABLET DAILY 90 tablet 3  . fluticasone (FLOVENT DISKUS) 50 MCG/BLIST diskus inhaler Inhale 1 puff into the lungs 2 (two) times daily. (Patient taking differently: Inhale 1 puff into the lungs daily. ) 1 Inhaler 5  . KLOR-CON M20 20 MEQ tablet TAKE 1 TABLET DAILY 90 tablet 3  . MAGNESIUM GLUCONATE PO Take 1 tablet by mouth daily.     . Methylcellulose, Laxative, (CITRUCEL PO) Take 1 tablet by mouth daily at 12 noon.     Marland Kitchen omeprazole (PRILOSEC) 40 MG capsule Take 1 capsule  (40 mg total) by mouth daily. (Patient taking differently: Take 40 mg by mouth daily. As needed) 90 capsule 3  . pravastatin (PRAVACHOL) 40 MG tablet TAKE 1 TABLET AT BEDTIME 90 tablet 3  . TOPROL XL 25 MG 24 hr tablet TAKE 1 TABLET DAILY 90 tablet 2  . valsartan (DIOVAN) 160 MG tablet TAKE 1 TABLET DAILY 90 tablet 0  . XARELTO 20 MG TABS tablet TAKE 1 TABLET DAILY 90 tablet 2  . esomeprazole (NEXIUM) 40 MG capsule Take 40 mg by mouth daily as needed for acid reflux/heartburn. (Patient not taking: Reported on 08/26/2016) 90 capsule 0  . Fluticasone Furoate (ARNUITY ELLIPTA) 100 MCG/ACT AEPB Inhale 1 puff into the lungs daily. (Patient not taking: Reported on 08/26/2016) 30 each 3   No current facility-administered medications on file prior to visit.      Review of Systems Constitutional:   No  weight loss, night sweats,  Fevers, chills, fatigue, or  lassitude.  HEENT:   No headaches,  Difficulty swallowing,  Tooth/dental problems, or  Sore throat,                No sneezing, itching, ear ache,  nasal congestion, post nasal drip,   CV:  No chest pain,  Orthopnea, PND, swelling in lower extremities, anasarca, dizziness, palpitations, syncope.   GI  No heartburn, indigestion, abdominal pain, nausea, vomiting, diarrhea, change in bowel habits, loss of appetite, bloody stools.   Resp: No shortness of breath with exertion or at rest.  No excess mucus, no productive cough,  No non-productive cough,  No coughing up of blood.  No change in color of mucus.  No wheezing.  No chest wall deformity  Skin: no rash or lesions.  GU: no dysuria, change in color of urine, no urgency or frequency.  No flank pain, no hematuria   MS:  No joint pain or swelling.  No decreased range of motion.  No back pain.  Psych:  No change in mood or affect. No depression or anxiety.  No memory loss.         Objective:   Physical Exam  Vitals:   08/26/16 1156  BP: 132/82  Pulse: 68  SpO2: 100%  Weight: 259 lb  6.4 oz (117.7 kg)  Height: 5\' 10"  (1.778 m)  Body mass index is 37.22 kg/m.   GEN: A/Ox3; pleasant , NAD , obese    HEENT:  [place]/AT,  EACs-clear, TMs-wnl, NOSE-clear, THROAT-clear, no lesions, no postnasal drip or exudate noted.   NECK:  Supple w/ fair ROM; no JVD; normal carotid impulses w/o bruits; no thyromegaly or nodules palpated; no lymphadenopathy.    RESP  Clear  P & A; w/o, wheezes/ rales/ or rhonchi. no accessory muscle use, no dullness to percussion  CARD:  RRR, no m/r/g  , no peripheral edema, pulses intact, no cyanosis or clubbing.  GI:   Soft & nt; nml bowel sounds; no organomegaly or masses detected.   Musco: Warm bil, no deformities or joint swelling noted.   Neuro: alert, no focal deficits noted.    Skin: Warm, no lesions or rashes  Tammy Parrett NP-C  [place] [facility]  08/26/2016       Assessment & Plan:

## 2016-08-26 NOTE — Progress Notes (Signed)
Reviewed and agree with assessment/plan.  [name], MD [facility] Pulmonary/Critical Care 08/26/2016, 12:59 PM Pager:  [phone]

## 2016-08-26 NOTE — Addendum Note (Signed)
Addended by: Mathis Bud on: 08/26/2016 12:45 PM   Modules accepted: Orders

## 2016-08-26 NOTE — Assessment & Plan Note (Signed)
Improved on Flovent  FeNO is trending down  Check PFT on reutrn   Plan  Patient Instructions  Take Flovent 2 puffs Twice daily  , rinse after use.  Continue on CPAP At bedtime   Follow up Dr. Halford Chessman  In 3-4 months with PFT  and As needed

## 2016-09-12 ENCOUNTER — Other Ambulatory Visit: Payer: Self-pay | Admitting: Family Medicine

## 2016-09-24 ENCOUNTER — Encounter: Payer: Self-pay | Admitting: Family Medicine

## 2016-09-24 ENCOUNTER — Ambulatory Visit (INDEPENDENT_AMBULATORY_CARE_PROVIDER_SITE_OTHER): Payer: Medicare Other | Admitting: Family Medicine

## 2016-09-24 VITALS — BP 134/98 | HR 88 | Temp 98.2°F | Ht 70.0 in | Wt 260.0 lb

## 2016-09-24 DIAGNOSIS — J45901 Unspecified asthma with (acute) exacerbation: Secondary | ICD-10-CM | POA: Diagnosis not present

## 2016-09-24 DIAGNOSIS — J209 Acute bronchitis, unspecified: Secondary | ICD-10-CM | POA: Diagnosis not present

## 2016-09-24 MED ORDER — METHYLPREDNISOLONE ACETATE 80 MG/ML IJ SUSP
120.0000 mg | Freq: Once | INTRAMUSCULAR | Status: AC
  Administered 2016-09-24: 120 mg via INTRAMUSCULAR

## 2016-09-24 MED ORDER — AMOXICILLIN-POT CLAVULANATE 875-125 MG PO TABS: 1.0000 | ORAL_TABLET | Freq: Two times a day (BID) | ORAL | 0 refills | Status: DC

## 2016-09-24 MED ORDER — HYDROCODONE-HOMATROPINE 5-1.5 MG/5ML PO SYRP: 5.0000 mL | ORAL_SOLUTION | ORAL | 0 refills | Status: DC | PRN

## 2016-09-24 MED ORDER — ALBUTEROL SULFATE HFA 108 (90 BASE) MCG/ACT IN AERS: 2.0000 | INHALATION_SPRAY | RESPIRATORY_TRACT | 5 refills | Status: DC | PRN

## 2016-09-24 NOTE — Progress Notes (Signed)
   Subjective:    Patient ID: Guy Moore, male    DOB: 1934/01/01, 82 y.o.   MRN: [id number]  HPI Here for one week of chest tightness, wheezing, and coughing up yellow sputum. No fever or chest pain. He was recently diagnosed with asthma on the Pulmonary clinic and he was started on maintenance Flovent bid. He is using Mucinex 1200 mg bid.    Review of Systems  Constitutional: Negative.   HENT: Positive for congestion. Negative for ear pain, postnasal drip, sinus pressure and sore throat.   Eyes: Negative.   Respiratory: Positive for cough, chest tightness, shortness of breath and wheezing.   Cardiovascular: Negative.        Objective:   Physical Exam  Constitutional: He appears well-developed and well-nourished. No distress.  HENT:  Right Ear: External ear normal.  Left Ear: External ear normal.  Nose: Nose normal.  Mouth/Throat: Oropharynx is clear and moist.  Eyes: Conjunctivae are normal.  Neck: No thyromegaly present.  Cardiovascular: Normal rate, regular rhythm, normal heart sounds and intact distal pulses.   Pulmonary/Chest: Effort normal. No respiratory distress. He has no rales.  Scattered wheezes and rhonchi   Lymphadenopathy:    He has no cervical adenopathy.          Assessment & Plan:  He has bronchitis and this has caused an acute asthma exacerbation. Given a steroid shot. Treat with Augmentin. Add Ventolin HFA to use prn.  Laurey Morale, MD

## 2016-09-24 NOTE — Addendum Note (Signed)
Addended by: Aggie Hacker A on: 09/24/2016 10:24 AM   Modules accepted: Orders

## 2016-09-24 NOTE — Progress Notes (Signed)
Pre visit review using our clinic review tool, if applicable. No additional management support is needed unless otherwise documented below in the visit note. 

## 2016-09-28 ENCOUNTER — Encounter: Payer: Self-pay | Admitting: Family Medicine

## 2016-09-28 ENCOUNTER — Other Ambulatory Visit: Payer: Self-pay | Admitting: Family Medicine

## 2016-09-28 MED ORDER — METHYLPREDNISOLONE 4 MG PO TBPK: ORAL_TABLET | ORAL | 0 refills | Status: DC

## 2016-09-28 NOTE — Telephone Encounter (Signed)
At this point testing for influenza would not change our treatment at all. I think a steroid taper would help his breathing. Call in a Medrol dose pack.

## 2016-09-28 NOTE — Telephone Encounter (Signed)
I sent script e-scribe to [facility] and spoke with pt. Also I will forward a note for someone to call pt and schedule a office visit.

## 2016-09-29 NOTE — Telephone Encounter (Signed)
Pt scheduled on Friday 12/22

## 2016-10-01 DIAGNOSIS — J45909 Unspecified asthma, uncomplicated: Secondary | ICD-10-CM | POA: Diagnosis not present

## 2016-10-01 DIAGNOSIS — I272 Pulmonary hypertension, unspecified: Secondary | ICD-10-CM | POA: Diagnosis not present

## 2016-10-01 DIAGNOSIS — I378 Other nonrheumatic pulmonary valve disorders: Secondary | ICD-10-CM | POA: Diagnosis not present

## 2016-10-01 DIAGNOSIS — R0609 Other forms of dyspnea: Secondary | ICD-10-CM | POA: Diagnosis not present

## 2016-10-01 DIAGNOSIS — I480 Paroxysmal atrial fibrillation: Secondary | ICD-10-CM | POA: Diagnosis not present

## 2016-10-02 ENCOUNTER — Ambulatory Visit (INDEPENDENT_AMBULATORY_CARE_PROVIDER_SITE_OTHER): Payer: Medicare Other | Admitting: Family Medicine

## 2016-10-02 ENCOUNTER — Encounter: Payer: Self-pay | Admitting: Family Medicine

## 2016-10-02 VITALS — BP 143/100 | HR 86 | Temp 98.1°F | Ht 70.0 in | Wt 247.0 lb

## 2016-10-02 DIAGNOSIS — I480 Paroxysmal atrial fibrillation: Secondary | ICD-10-CM

## 2016-10-02 DIAGNOSIS — I10 Essential (primary) hypertension: Secondary | ICD-10-CM

## 2016-10-02 DIAGNOSIS — J4520 Mild intermittent asthma, uncomplicated: Secondary | ICD-10-CM | POA: Diagnosis not present

## 2016-10-02 DIAGNOSIS — J209 Acute bronchitis, unspecified: Secondary | ICD-10-CM | POA: Diagnosis not present

## 2016-10-02 MED ORDER — AZITHROMYCIN 250 MG PO TABS: ORAL_TABLET | ORAL | 0 refills | Status: DC

## 2016-10-02 NOTE — Progress Notes (Signed)
   Subjective:    Patient ID: Guy Moore, male    DOB: 11-Nov-1933, 82 y.o.   MRN: [id number]  HPI Here with his wife to follow up a 3 week hx of chest congestion and coughing up yellow sputum. He was here on 09-24-16 and was started on Augmentin and a Ventolin inihaler. A few days later we added a Medrol dose pack. Yesterday he saw Dr. [name] of [facility] in [place] for his paroxysmal atrial fibrillation. He had a CXR which was clear and an ECHO that showed an EF of 47%. He is wearing a 14 day monitor as well. No today he still has chest congestion, wheezing, and is coughing up yellow sputum. No fever. His wife now has similar symptoms. When he saw Dr. Halford Chessman in the past he was told that he may have mild asthma and he has been using a Flovent discus bid since then.    Review of Systems  Constitutional: Negative.   HENT: Negative.   Eyes: Negative.   Respiratory: Positive for cough, chest tightness, shortness of breath and wheezing.   Cardiovascular: Negative.        Objective:   Physical Exam  Constitutional: He is oriented to person, place, and time. He appears well-developed and well-nourished. No distress.  HENT:  Right Ear: External ear normal.  Left Ear: External ear normal.  Nose: Nose normal.  Mouth/Throat: Oropharynx is clear and moist.  Eyes: Conjunctivae are normal.  Neck: No thyromegaly present.  Cardiovascular: Normal rate, regular rhythm, normal heart sounds and intact distal pulses.   Pulmonary/Chest: Effort normal. He has no rales.  Scattered rhonchi and wheezes  Musculoskeletal: He exhibits no edema.  Lymphadenopathy:    He has no cervical adenopathy.  Neurological: He is alert and oriented to person, place, and time.          Assessment & Plan:  Bronchitis, possibly atypical. He will stop the Augmentin and switch to a Zpack. For the bronchospasm he will use the albuterol inhaler prn and finish out the Medrol dose pack. He can try some  of his wife's Benzonatate prn. He will follow up with Dr. Lenard Galloway as scheduled.  Alysia Penna, MD

## 2016-10-02 NOTE — Progress Notes (Signed)
Pre visit review using our clinic review tool, if applicable. No additional management support is needed unless otherwise documented below in the visit note. 

## 2016-10-18 ENCOUNTER — Other Ambulatory Visit: Payer: Self-pay | Admitting: Family Medicine

## 2016-10-27 ENCOUNTER — Encounter: Payer: Self-pay | Admitting: Family Medicine

## 2016-11-02 ENCOUNTER — Telehealth: Payer: Self-pay | Admitting: Pulmonary Disease

## 2016-11-02 NOTE — Telephone Encounter (Signed)
lmtcb x1 for pt. 

## 2016-11-03 NOTE — Telephone Encounter (Signed)
lmtcb for pt.  

## 2016-11-04 DIAGNOSIS — Z7901 Long term (current) use of anticoagulants: Secondary | ICD-10-CM | POA: Diagnosis not present

## 2016-11-04 DIAGNOSIS — J45909 Unspecified asthma, uncomplicated: Secondary | ICD-10-CM | POA: Diagnosis not present

## 2016-11-04 DIAGNOSIS — I10 Essential (primary) hypertension: Secondary | ICD-10-CM | POA: Diagnosis not present

## 2016-11-04 DIAGNOSIS — R42 Dizziness and giddiness: Secondary | ICD-10-CM | POA: Diagnosis not present

## 2016-11-04 DIAGNOSIS — I471 Supraventricular tachycardia: Secondary | ICD-10-CM | POA: Diagnosis not present

## 2016-11-04 DIAGNOSIS — Z8249 Family history of ischemic heart disease and other diseases of the circulatory system: Secondary | ICD-10-CM | POA: Diagnosis not present

## 2016-11-04 DIAGNOSIS — K219 Gastro-esophageal reflux disease without esophagitis: Secondary | ICD-10-CM | POA: Diagnosis not present

## 2016-11-04 DIAGNOSIS — Z87891 Personal history of nicotine dependence: Secondary | ICD-10-CM | POA: Diagnosis not present

## 2016-11-04 DIAGNOSIS — G4730 Sleep apnea, unspecified: Secondary | ICD-10-CM | POA: Diagnosis not present

## 2016-11-04 DIAGNOSIS — E785 Hyperlipidemia, unspecified: Secondary | ICD-10-CM | POA: Diagnosis not present

## 2016-11-04 DIAGNOSIS — R5383 Other fatigue: Secondary | ICD-10-CM | POA: Diagnosis not present

## 2016-11-04 DIAGNOSIS — I480 Paroxysmal atrial fibrillation: Secondary | ICD-10-CM | POA: Diagnosis not present

## 2016-11-04 DIAGNOSIS — Z79899 Other long term (current) drug therapy: Secondary | ICD-10-CM | POA: Diagnosis not present

## 2016-11-04 DIAGNOSIS — E119 Type 2 diabetes mellitus without complications: Secondary | ICD-10-CM | POA: Diagnosis not present

## 2016-11-04 DIAGNOSIS — I481 Persistent atrial fibrillation: Secondary | ICD-10-CM | POA: Diagnosis not present

## 2016-11-04 DIAGNOSIS — I495 Sick sinus syndrome: Secondary | ICD-10-CM | POA: Diagnosis not present

## 2016-11-04 DIAGNOSIS — R0602 Shortness of breath: Secondary | ICD-10-CM | POA: Diagnosis not present

## 2016-11-04 NOTE — Telephone Encounter (Signed)
lmtcb x3 for pt. 

## 2016-11-05 DIAGNOSIS — Z950 Presence of cardiac pacemaker: Secondary | ICD-10-CM | POA: Diagnosis not present

## 2016-11-05 DIAGNOSIS — R0602 Shortness of breath: Secondary | ICD-10-CM | POA: Diagnosis not present

## 2016-11-05 DIAGNOSIS — I481 Persistent atrial fibrillation: Secondary | ICD-10-CM | POA: Diagnosis not present

## 2016-11-05 DIAGNOSIS — I471 Supraventricular tachycardia: Secondary | ICD-10-CM | POA: Diagnosis not present

## 2016-11-05 DIAGNOSIS — I495 Sick sinus syndrome: Secondary | ICD-10-CM | POA: Diagnosis not present

## 2016-11-05 DIAGNOSIS — R42 Dizziness and giddiness: Secondary | ICD-10-CM | POA: Diagnosis not present

## 2016-11-05 DIAGNOSIS — R5383 Other fatigue: Secondary | ICD-10-CM | POA: Diagnosis not present

## 2016-11-05 NOTE — Telephone Encounter (Signed)
[facility]  Per triage protocol, this message will be closed - a new message can be created when pt returns call

## 2016-11-06 ENCOUNTER — Telehealth: Payer: Self-pay | Admitting: Pulmonary Disease

## 2016-11-06 MED ORDER — FLUTICASONE PROPIONATE (INHAL) 50 MCG/BLIST IN AEPB: 1.0000 | INHALATION_SPRAY | Freq: Two times a day (BID) | RESPIRATORY_TRACT | 0 refills | Status: DC

## 2016-11-06 MED ORDER — FLUTICASONE PROPIONATE (INHAL) 50 MCG/BLIST IN AEPB: 1.0000 | INHALATION_SPRAY | Freq: Two times a day (BID) | RESPIRATORY_TRACT | 3 refills | Status: DC

## 2016-11-06 NOTE — Telephone Encounter (Signed)
From phone note from 1.22.18: Pt recs [place] from [facility] but states will now have to get this from [facility] due to insurance. States he has 4-5 days left of medication.    Called and spoke to pt. Pt states he is needing Flovent sent to local pharmacy but cannot pay the price at the retail pharmacy because this is a long term med that needs to be filled at [place]. Called [facility] and they stated as long as the local pharmacy calls [facility] when pt is filling the rx they can supply pt with lower cost inhaler. 90 day rx sent to [facility] and 30 day sent to local pharmacy. Called and spoke to pt's wife and informed her. Nothing further needed.

## 2016-11-26 ENCOUNTER — Other Ambulatory Visit: Payer: Self-pay | Admitting: Family Medicine

## 2016-12-01 ENCOUNTER — Encounter: Payer: Self-pay | Admitting: Pulmonary Disease

## 2016-12-01 ENCOUNTER — Ambulatory Visit (INDEPENDENT_AMBULATORY_CARE_PROVIDER_SITE_OTHER): Payer: Medicare Other | Admitting: Pulmonary Disease

## 2016-12-01 ENCOUNTER — Ambulatory Visit: Payer: Self-pay | Admitting: Pulmonary Disease

## 2016-12-01 VITALS — BP 148/88 | HR 110 | Ht 70.0 in | Wt 256.0 lb

## 2016-12-01 DIAGNOSIS — J432 Centrilobular emphysema: Secondary | ICD-10-CM | POA: Diagnosis not present

## 2016-12-01 DIAGNOSIS — J4520 Mild intermittent asthma, uncomplicated: Secondary | ICD-10-CM

## 2016-12-01 DIAGNOSIS — G4733 Obstructive sleep apnea (adult) (pediatric): Secondary | ICD-10-CM | POA: Diagnosis not present

## 2016-12-01 DIAGNOSIS — Z9989 Dependence on other enabling machines and devices: Secondary | ICD-10-CM

## 2016-12-01 DIAGNOSIS — J449 Chronic obstructive pulmonary disease, unspecified: Secondary | ICD-10-CM | POA: Diagnosis not present

## 2016-12-01 LAB — PULMONARY FUNCTION TEST
DL/VA % pred: 73 %
DL/VA: 3.36 ml/min/mmHg/L
DLCO COR % PRED: 57 %
DLCO COR: 18.49 ml/min/mmHg
DLCO UNC % PRED: 58 %
DLCO UNC: 18.85 ml/min/mmHg
FEF 25-75 POST: 1.14 L/s
FEF 25-75 PRE: 0.71 L/s
FEF2575-%Change-Post: 59 %
FEF2575-%PRED-PRE: 37 %
FEF2575-%Pred-Post: 59 %
FEV1-%Change-Post: 12 %
FEV1-%PRED-POST: 68 %
FEV1-%PRED-PRE: 60 %
FEV1-POST: 1.93 L
FEV1-Pre: 1.71 L
FEV1FVC-%Change-Post: 3 %
FEV1FVC-%PRED-PRE: 83 %
FEV6-%CHANGE-POST: 15 %
FEV6-%PRED-POST: 83 %
FEV6-%PRED-PRE: 72 %
FEV6-POST: 3.1 L
FEV6-Pre: 2.69 L
FEV6FVC-%Change-Post: 3 %
FEV6FVC-%PRED-POST: 105 %
FEV6FVC-%Pred-Pre: 102 %
FVC-%Change-Post: 9 %
FVC-%PRED-POST: 79 %
FVC-%PRED-PRE: 72 %
FVC-PRE: 2.9 L
FVC-Post: 3.17 L
POST FEV6/FVC RATIO: 98 %
PRE FEV1/FVC RATIO: 59 %
Post FEV1/FVC ratio: 61 %
Pre FEV6/FVC Ratio: 95 %
RV % pred: 119 %
RV: 3.21 L
TLC % PRED: 93 %
TLC: 6.59 L

## 2016-12-01 NOTE — Progress Notes (Signed)
Current Outpatient Prescriptions on File Prior to Visit  Medication Sig  . albuterol (PROVENTIL HFA;VENTOLIN HFA) 108 (90 Base) MCG/ACT inhaler Inhale 2 puffs into the lungs every 4 (four) hours as needed for wheezing or shortness of breath.  Marland Kitchen amLODipine (NORVASC) 5 MG tablet TAKE 1 TABLET DAILY  . esomeprazole (NEXIUM) 40 MG capsule Take 40 mg by mouth daily as needed for acid reflux/heartburn.  . fluticasone (FLOVENT DISKUS) 50 MCG/BLIST diskus inhaler Inhale 1 puff into the lungs 2 (two) times daily.  Marland Kitchen HYDROcodone-homatropine (HYDROMET) 5-1.5 MG/5ML syrup Take 5 mLs by mouth every 4 (four) hours as needed.  Marland Kitchen KLOR-CON M20 20 MEQ tablet TAKE 1 TABLET DAILY  . MAGNESIUM GLUCONATE PO Take 1 tablet by mouth daily.   . Methylcellulose, Laxative, (CITRUCEL PO) Take 1 tablet by mouth daily at 12 noon.   Marland Kitchen omeprazole (PRILOSEC) 40 MG capsule Take 1 capsule (40 mg total) by mouth daily. (Patient taking differently: Take 40 mg by mouth daily. As needed)  . pravastatin (PRAVACHOL) 40 MG tablet TAKE 1 TABLET AT BEDTIME  . valsartan (DIOVAN) 160 MG tablet TAKE 1 TABLET DAILY  . XARELTO 20 MG TABS tablet TAKE 1 TABLET DAILY   No current facility-administered medications on file prior to visit.     Chief Complaint  Patient presents with  . Follow-up    PFT today. Pt states that he has had a pacemaker placed since last OV. Pt states that he still has a dry cough in morning and throat clearing. Pt reports some mucus production in AM.     Sleep tests PSG 07/24/09 >> AHI 58, SpO2 low 74% Auto CPAP 10/31/16 to 11/29/16 >> used on 30 of 30 nights with average 8 hrs 56 min.  Average AHI 4.6 with median CPAP 9 and 95 th percentile CPAP 13 cm H2O  Pulmonary tests CT angio chest 12/23/07 >> mild emphysema, scarring RML FeNO 08/04/16 >> 50 PFT 12/01/16 >> FEV1 1.93 (68%), FEV1% 61, TLC 6.59 (93%), DLCO 58%, +BD  Past medical history A fib, HTN, HLD, DM, PUD, HH, Diverticulosis, GERD, Depression  Past  surgical history, Family history, Social history, Allergies reviewed  Vital signs BP (!) 148/88 (BP Location: Left Arm, Cuff Size: Normal)   Pulse (!) 110   Ht 5\' 10"  (1.778 m)   Wt 256 lb (116.1 kg)   SpO2 98%   BMI 36.73 kg/m   History of Present Illness: Guy Moore is a 83 y.o. male former smoker with obstructive sleep apnea and COPD with asthma and emphysema.  Since his last visit he was found to have sick sinus.  He had pacemaker inserted.  He uses flovent daily.  He isn't needing to use albuterol much.  Still gets occasional cough and congestion, but not has bad as before.  He is not having wheeze, chest pain, fever, skin rash, or leg swelling.  Sinus okay, and denies sore throat.  He uses CPAP nightly without difficulty.  His CPAP download was reviewed with patient.  PFT today showed mild obstruction, air trapping, moderate diffusion defect, and positive bronchodilator response.  This was reviewed with patient.  CT chest from 2009 showed changes of emphysema.   Physical Exam:  General - pleasant Eyes - pupils reactive ENT - no sinus tenderness, no oral exudate, s/p UPPP, no LAN Cardiac - regular, no murmur Chest - no wheeze, rales Back - no tenderness Abd - soft, non tender Ext - no edema Neuro - normal strength Skin -  no rashes Psych - normal mood   Assessment/Plan:  COPD with asthma and emphysema. - continue flovent and prn albuterol - reviewed when to use his inhalers and symptoms to monitor for  Obstructive sleep apnea. - he is compliant with CPAP and reports benefit from therapy - continue auto CPAP   Patient Instructions  Follow up in 6 months   [name], MD [place] [facility]/Critical Care/Sleep Pager:  [phone] 12/01/2016, 11:54 AM

## 2016-12-01 NOTE — Patient Instructions (Signed)
Follow up in 6 months 

## 2016-12-08 DIAGNOSIS — M4716 Other spondylosis with myelopathy, lumbar region: Secondary | ICD-10-CM | POA: Diagnosis not present

## 2016-12-08 DIAGNOSIS — S39012A Strain of muscle, fascia and tendon of lower back, initial encounter: Secondary | ICD-10-CM | POA: Diagnosis not present

## 2016-12-16 ENCOUNTER — Other Ambulatory Visit: Payer: Self-pay | Admitting: Pulmonary Disease

## 2016-12-16 MED ORDER — FLUTICASONE PROPIONATE (INHAL) 50 MCG/BLIST IN AEPB: 1.0000 | INHALATION_SPRAY | Freq: Two times a day (BID) | RESPIRATORY_TRACT | 1 refills | Status: DC

## 2017-01-18 ENCOUNTER — Other Ambulatory Visit: Payer: Self-pay | Admitting: Family Medicine

## 2017-02-11 DIAGNOSIS — I495 Sick sinus syndrome: Secondary | ICD-10-CM | POA: Diagnosis not present

## 2017-02-11 DIAGNOSIS — I482 Chronic atrial fibrillation: Secondary | ICD-10-CM | POA: Diagnosis not present

## 2017-02-11 DIAGNOSIS — Z950 Presence of cardiac pacemaker: Secondary | ICD-10-CM | POA: Diagnosis not present

## 2017-03-08 ENCOUNTER — Ambulatory Visit (HOSPITAL_COMMUNITY)
Admission: EM | Admit: 2017-03-08 | Discharge: 2017-03-08 | Disposition: A | Discharge status: Home / Self Care | Payer: Medicare Other | Attending: Family Medicine | Admitting: Family Medicine

## 2017-03-08 ENCOUNTER — Ambulatory Visit (INDEPENDENT_AMBULATORY_CARE_PROVIDER_SITE_OTHER): Payer: Medicare Other

## 2017-03-08 ENCOUNTER — Encounter (HOSPITAL_COMMUNITY): Payer: Self-pay | Admitting: Emergency Medicine

## 2017-03-08 DIAGNOSIS — J40 Bronchitis, not specified as acute or chronic: Secondary | ICD-10-CM

## 2017-03-08 DIAGNOSIS — R05 Cough: Secondary | ICD-10-CM | POA: Diagnosis not present

## 2017-03-08 DIAGNOSIS — R0602 Shortness of breath: Secondary | ICD-10-CM | POA: Diagnosis not present

## 2017-03-08 MED ORDER — AZITHROMYCIN 250 MG PO TABS: 250.0000 mg | ORAL_TABLET | Freq: Every day | ORAL | 0 refills | Status: DC

## 2017-03-08 MED ORDER — ALBUTEROL SULFATE HFA 108 (90 BASE) MCG/ACT IN AERS: 2.0000 | INHALATION_SPRAY | Freq: Four times a day (QID) | RESPIRATORY_TRACT | 2 refills | Status: DC | PRN

## 2017-03-08 NOTE — ED Provider Notes (Signed)
CSN: [id number]     Arrival date & time 03/08/17  1249 History   First MD Initiated Contact with Patient 03/08/17 1350     Chief Complaint  Patient presents with  . Shortness of Breath   (Consider location/radiation/quality/duration/timing/severity/associated sxs/prior Treatment) Patient is a 83 y.o. Male, with hx of Afib, T2DM, HTN, had cold symptoms/allergy symptoms for the last 4 days with running nose, nasal congestion, coughing, sore throat and he began to feel a little better then all of sudden last night he developed a shortness of breath with a temp of 100.3 in the middle of the night. He also endorses wheezing. No denies chest pain, dizziness, or headache. He is a non-smoker. Sore throat has resolved.         Past Medical History:  Diagnosis Date  . Allergic rhinitis   . Arthritis   . Atrial fibrillation [facility])    sees Dr. [name] at [facility]   . Atrial flutter ([place])   . BCC (basal cell carcinoma of skin)    Nose  . Depression   . Diverticulosis   . DIVERTICULOSIS, COLON 04/13/2007       . Erosive esophagitis   . Esophageal stricture   . Gastric ulcer   . Hearing loss    uses amplification  . Hemorrhoids   . Hiatal hernia   . HTN (hypertension)   . Hyperglycemia   . 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 Other and unspecified hyperlipidemia   . Sleep apnea   . Type II or unspecified type diabetes mellitus without mention of complication, not stated as uncontrolled    Diet control    Past Surgical History:  Procedure Laterality Date  . CARDIAC ELECTROPHYSIOLOGY STUDY AND ABLATION     x 4 (sees Dr. [name] at [facility])  . NOSE SURGERY    . UVULOPALATOPHARYNGOPLASTY  1980's  . VASECTOMY  7902'I   w/complications   Family History  Problem Relation Age of Onset  . Stroke Mother   . Breast cancer Mother   . Heart disease Father   . Hypertension Father   . Cancer Father        Bladder  . Breast cancer  Brother   . Atrial fibrillation Brother   . Tremor Brother   . Stroke Brother   . Colon cancer Neg Hx    Social History  Substance Use Topics  . Smoking status: Former Smoker    Packs/day: 1.00    Years: 28.00    Types: Cigarettes    Quit date: 10/12/1977  . Smokeless tobacco: Never Used  . Alcohol use No    Review of Systems  Constitutional: Positive for fever. Negative for chills and fatigue.  HENT: Positive for congestion, rhinorrhea, sneezing and sore throat.   Respiratory: Positive for cough, shortness of breath and wheezing. Negative for chest tightness.   Cardiovascular: Negative for chest pain and palpitations.  Gastrointestinal: Negative for abdominal pain and nausea.  Skin: Negative for rash.  Neurological: Negative for dizziness and headaches.    Allergies  Patient has no known allergies.  Home Medications   Prior to Admission medications   Medication Sig Start Date End Date Taking? Authorizing Provider  albuterol (PROVENTIL HFA;VENTOLIN HFA) 108 (90 Base) MCG/ACT inhaler Inhale 2 puffs into the lungs every 6 (six) hours as needed for wheezing or shortness of breath. 03/08/17   Barry Dienes, NP  amLODipine (NORVASC) 5 MG tablet TAKE 1 TABLET  DAILY 07/03/16   Marin Olp, MD  azithromycin (ZITHROMAX) 250 MG tablet Take 1 tablet (250 mg total) by mouth daily. Take first 2 tablets together, then 1 every day until finished. 03/08/17   Barry Dienes, NP  esomeprazole (NEXIUM) 40 MG capsule Take 40 mg by mouth daily as needed for acid reflux/heartburn. 08/12/16   Laurey Morale, MD  fluticasone (FLOVENT DISKUS) 50 MCG/BLIST diskus inhaler Inhale 1 puff into the lungs 2 (two) times daily. 12/16/16   [name], MD  HYDROcodone-homatropine (HYDROMET) 5-1.5 MG/5ML syrup Take 5 mLs by mouth every 4 (four) hours as needed. 09/24/16   Laurey Morale, MD  KLOR-CON M20 20 MEQ tablet TAKE 1 TABLET DAILY 06/16/16   Laurey Morale, MD  MAGNESIUM GLUCONATE PO Take 1 tablet by mouth  daily.     [provider]  Methylcellulose, Laxative, (CITRUCEL PO) Take 1 tablet by mouth daily at 12 noon.     [provider]  omeprazole (PRILOSEC) 40 MG capsule Take 1 capsule (40 mg total) by mouth daily. Patient taking differently: Take 40 mg by mouth daily. As needed 08/14/16   Laurey Morale, MD  pravastatin (PRAVACHOL) 40 MG tablet TAKE 1 TABLET AT BEDTIME 07/03/16   Marin Olp, MD  valsartan (DIOVAN) 160 MG tablet TAKE 1 TABLET DAILY 01/18/17   Laurey Morale, MD  XARELTO 20 MG TABS tablet TAKE 1 TABLET DAILY 08/04/16   Laurey Morale, MD   Meds Ordered and Administered this Visit  Medications - No data to display  BP (!) 168/95 (BP Location: Left Arm)   Pulse 84   Temp 98.9 F (37.2 C) (Oral)   Resp 20   SpO2 98%  No data found.   Physical Exam  Constitutional: He is oriented to person, place, and time. He appears well-developed and well-nourished.  HENT:  Head: Normocephalic and atraumatic.  Right Ear: External ear normal.  Left Ear: External ear normal.  Nose: Nose normal.  Mouth/Throat: Oropharynx is clear and moist. No oropharyngeal exudate.  TM pearly gray with no erythema  Eyes: EOM are normal. Pupils are equal, round, and reactive to light.  Neck: Normal range of motion. Neck supple.  Cardiovascular: Normal rate and normal heart sounds.   Irregular rhythm; has hx of afib.   Pulmonary/Chest: Effort normal and breath sounds normal. He has no wheezes.  Abdominal: Soft. Bowel sounds are normal. He exhibits no distension. There is no tenderness.  Lymphadenopathy:    He has no cervical adenopathy.  Neurological: He is alert and oriented to person, place, and time.  Skin: Skin is warm and dry. No rash noted.  Nursing note and vitals reviewed.   Urgent Care Course     Procedures (including critical care time)  Labs Review Labs Reviewed - No data to display  Imaging Review Dg Chest 2 View  Result Date: 03/08/2017 CLINICAL DATA:   Cough, fever and shortness of Breath EXAM: CHEST  2 VIEW COMPARISON:  01/28/2016 FINDINGS: Left chest wall pacer device is noted with leads in the right atrial appendage and right ventricle. The heart size and mediastinal contours are within normal limits. Both lungs are clear. The visualized skeletal structures are unremarkable. IMPRESSION: No active cardiopulmonary disease. Electronically Signed   By: Kerby Moors M.D.   On: 03/08/2017 14:15    MDM   1. Bronchitis     Patient appears well. Physical examination was unremarkable with no red flags, he is safe to be  discharge home. Will send home with z-pak empirically. RX for albuterol inhaler also given to use as needed. Continue with tylenol for fever. Return precaution discussed.    Barry Dienes, NP 03/08/17 1421

## 2017-03-08 NOTE — ED Triage Notes (Signed)
The patient presented to the [facility] with a complaint of SOB that started last night. The patient reported a fever at home last night of 100.69F last night. The patient stated that this was controlled with PO Tylenol. The patient reported congestion earlier in the week after working in the yard.

## 2017-03-10 DIAGNOSIS — S39012A Strain of muscle, fascia and tendon of lower back, initial encounter: Secondary | ICD-10-CM | POA: Diagnosis not present

## 2017-03-12 ENCOUNTER — Ambulatory Visit (INDEPENDENT_AMBULATORY_CARE_PROVIDER_SITE_OTHER): Payer: Medicare Other | Admitting: Family Medicine

## 2017-03-12 ENCOUNTER — Encounter: Payer: Self-pay | Admitting: Family Medicine

## 2017-03-12 VITALS — BP 132/88 | HR 98 | Temp 98.3°F | Ht 70.0 in | Wt 254.0 lb

## 2017-03-12 DIAGNOSIS — J209 Acute bronchitis, unspecified: Secondary | ICD-10-CM

## 2017-03-12 MED ORDER — METHYLPREDNISOLONE 4 MG PO TBPK: ORAL_TABLET | ORAL | 0 refills | Status: DC

## 2017-03-12 MED ORDER — LEVOFLOXACIN 500 MG PO TABS: 500.0000 mg | ORAL_TABLET | Freq: Every day | ORAL | 0 refills | Status: AC

## 2017-03-12 NOTE — Progress Notes (Signed)
   Subjective:    Patient ID: Guy Moore, male    DOB: 1933/12/03, 83 y.o.   MRN: [id number]  HPI Here to follow up on an Urgent Care visit on 03-08-17 for a bronchitis. For the past week he as had chest congestion, wheezing, and coughing up yellow sputum. No chest pain. He has had fever to 100 degrees off and on. At the [facility] visit he had a clear CXR. He was given a Zpack and an albuterol inhaler. Since then he reall yhas not improved much although the fevers resolved. His chest is still tight and he coughs all the time. Using Mucinex.    Review of Systems  Constitutional: Negative.   HENT: Negative.   Eyes: Negative.   Respiratory: Positive for chest tightness, shortness of breath and wheezing.   Cardiovascular: Negative.   Gastrointestinal: Negative.        Objective:   Physical Exam  Constitutional: He appears well-developed and well-nourished.  He has audible wheezing   HENT:  Right Ear: External ear normal.  Left Ear: External ear normal.  Nose: Nose normal.  Mouth/Throat: Oropharynx is clear and moist.  Eyes: Conjunctivae are normal.  Neck: Neck supple. No thyromegaly present.  Cardiovascular: Normal rate, regular rhythm, normal heart sounds and intact distal pulses.   Pulmonary/Chest: Effort normal. He has no rales.  Scattered wheezes and rhonchi  Lymphadenopathy:    He has no cervical adenopathy.          Assessment & Plan:  Partially treated bronchitis. Given Levaquin and a Medrol dose pack. Continue Mucinex and albuterol prn.  Alysia Penna, MD

## 2017-03-12 NOTE — Patient Instructions (Signed)
WE NOW OFFER   [facility] FAST TRACK!!!  SAME DAY Appointments for ACUTE CARE  Such as: Sprains, Injuries, cuts, abrasions, rashes, muscle pain, joint pain, back pain Colds, flu, sore throats, headache, allergies, cough, fever  Ear pain, sinus and eye infections Abdominal pain, nausea, vomiting, diarrhea, upset stomach Animal/insect bites  3 Easy Ways to Schedule: Walk-In Scheduling Call in scheduling Mychart Sign-up: [email or web address]         

## 2017-05-01 ENCOUNTER — Other Ambulatory Visit: Payer: Self-pay | Admitting: Family Medicine

## 2017-05-17 DIAGNOSIS — Z950 Presence of cardiac pacemaker: Secondary | ICD-10-CM | POA: Diagnosis not present

## 2017-05-17 DIAGNOSIS — I482 Chronic atrial fibrillation: Secondary | ICD-10-CM | POA: Diagnosis not present

## 2017-05-22 ENCOUNTER — Other Ambulatory Visit: Payer: Self-pay | Admitting: Family Medicine

## 2017-05-24 ENCOUNTER — Encounter: Payer: Self-pay | Admitting: Family Medicine

## 2017-05-24 ENCOUNTER — Other Ambulatory Visit: Payer: Self-pay | Admitting: Family Medicine

## 2017-05-24 DIAGNOSIS — H3530 Unspecified macular degeneration: Secondary | ICD-10-CM | POA: Diagnosis not present

## 2017-05-25 ENCOUNTER — Encounter: Payer: Self-pay | Admitting: Family Medicine

## 2017-05-25 NOTE — Telephone Encounter (Signed)
This is a duplicate note, see previous one.  

## 2017-05-31 ENCOUNTER — Ambulatory Visit (INDEPENDENT_AMBULATORY_CARE_PROVIDER_SITE_OTHER): Payer: Medicare Other | Admitting: Pulmonary Disease

## 2017-05-31 ENCOUNTER — Encounter: Payer: Self-pay | Admitting: Pulmonary Disease

## 2017-05-31 VITALS — BP 122/80 | HR 80 | Ht 70.0 in | Wt 255.0 lb

## 2017-05-31 DIAGNOSIS — J449 Chronic obstructive pulmonary disease, unspecified: Secondary | ICD-10-CM

## 2017-05-31 DIAGNOSIS — Z9989 Dependence on other enabling machines and devices: Secondary | ICD-10-CM

## 2017-05-31 DIAGNOSIS — G4733 Obstructive sleep apnea (adult) (pediatric): Secondary | ICD-10-CM | POA: Diagnosis not present

## 2017-05-31 MED ORDER — ALBUTEROL SULFATE HFA 108 (90 BASE) MCG/ACT IN AERS
2.0000 | INHALATION_SPRAY | Freq: Four times a day (QID) | RESPIRATORY_TRACT | 3 refills | Status: DC | PRN
Start: 2017-05-31 — End: 2019-11-20

## 2017-05-31 NOTE — Progress Notes (Signed)
Current Outpatient Prescriptions on File Prior to Visit  Medication Sig  . amLODipine (NORVASC) 5 MG tablet TAKE 1 TABLET DAILY  . fluticasone (FLOVENT DISKUS) 50 MCG/BLIST diskus inhaler Inhale 1 puff into the lungs 2 (two) times daily.  Marland Kitchen KLOR-CON M20 20 MEQ tablet TAKE 1 TABLET DAILY  . MAGNESIUM GLUCONATE PO Take 1 tablet by mouth daily.   . Methylcellulose, Laxative, (CITRUCEL PO) Take 1 tablet by mouth daily at 12 noon.   Marland Kitchen omeprazole (PRILOSEC) 40 MG capsule Take 1 capsule (40 mg total) by mouth daily. (Patient taking differently: Take 40 mg by mouth daily. As needed)  . pravastatin (PRAVACHOL) 40 MG tablet TAKE 1 TABLET AT BEDTIME  . valsartan (DIOVAN) 160 MG tablet TAKE 1 TABLET DAILY  . XARELTO 20 MG TABS tablet TAKE 1 TABLET DAILY   No current facility-administered medications on file prior to visit.     Chief Complaint  Patient presents with  . Follow-up    Pt states that his breathing is okay -- changes with weather; heat, humid. Increased mucus production in throat.    Sleep tests PSG 07/24/09 >> AHI 58, SpO2 low 74% Auto CPAP 10/31/16 to 11/29/16 >> used on 30 of 30 nights with average 8 hrs 56 min.  Average AHI 4.6 with median CPAP 9 and 95 th percentile CPAP 13 cm H2O  Pulmonary tests CT angio chest 12/23/07 >> mild emphysema, scarring RML FeNO 08/04/16 >> 50 PFT 12/01/16 >> FEV1 1.93 (68%), FEV1% 61, TLC 6.59 (93%), DLCO 58%, +BD  Past medical history A fib, HTN, HLD, DM, PUD, HH, Diverticulosis, GERD, Depression  Past surgical history, Family history, Social history, Allergies reviewed  Vital signs BP 122/80 (BP Location: Left Arm, Cuff Size: Normal)   Pulse 80   Ht 5\' 10"  (1.778 m)   Wt 255 lb (115.7 kg)   SpO2 97%   BMI 36.59 kg/m   History of Present Illness: Guy Moore is a 82 y.o. male former smoker with obstructive sleep apnea and COPD with asthma and emphysema.  He has some trouble with hot, humid weather.  Also when he is around dust.   Otherwise doing okay.  No cough, wheeze, chest tightness.  Not using albuterol much.  Uses CPAP nightly w/o difficulty.   Physical Exam:  General - pleasant Eyes - pupils reactive ENT - no sinus tenderness, no oral exudate, no LAN Cardiac - regular, no murmur Chest - no wheeze, rales Abd - soft, non tender Ext - no edema Skin - no rashes Neuro - normal strength Psych - normal mood   Assessment/Plan:  COPD with asthma and emphysema. - continue flovent and prn albuterol  Obstructive sleep apnea. - he is compliant with CPAP and reports benefit from therapy - continue auto CPAP  Patient Instructions  Follow up in 1 year   [name], MD [facility] Pager:  [phone] 05/31/2017, 2:01 PM

## 2017-05-31 NOTE — Patient Instructions (Signed)
Follow up in 1 year.

## 2017-06-11 DIAGNOSIS — M47816 Spondylosis without myelopathy or radiculopathy, lumbar region: Secondary | ICD-10-CM | POA: Diagnosis not present

## 2017-06-28 ENCOUNTER — Other Ambulatory Visit: Payer: Self-pay | Admitting: Family Medicine

## 2017-07-01 ENCOUNTER — Encounter: Payer: Self-pay | Admitting: Family Medicine

## 2017-08-08 ENCOUNTER — Other Ambulatory Visit: Payer: Self-pay | Admitting: Family Medicine

## 2017-09-01 ENCOUNTER — Other Ambulatory Visit: Payer: Self-pay

## 2017-09-01 ENCOUNTER — Encounter: Payer: Self-pay | Admitting: Family Medicine

## 2017-09-01 MED ORDER — PRAVASTATIN SODIUM 40 MG PO TABS: 40.0000 mg | ORAL_TABLET | Freq: Every day | ORAL | 3 refills | Status: DC

## 2017-09-01 NOTE — Telephone Encounter (Signed)
Rx for pravastatin was sent to [facility]. Pt advised through MyChart that this was done.

## 2017-09-01 NOTE — Telephone Encounter (Signed)
Call in #[phone] with 3 rf  

## 2017-09-23 DIAGNOSIS — M47816 Spondylosis without myelopathy or radiculopathy, lumbar region: Secondary | ICD-10-CM | POA: Diagnosis not present

## 2017-09-28 DIAGNOSIS — Z950 Presence of cardiac pacemaker: Secondary | ICD-10-CM | POA: Diagnosis not present

## 2017-09-28 DIAGNOSIS — I495 Sick sinus syndrome: Secondary | ICD-10-CM | POA: Diagnosis not present

## 2017-11-12 ENCOUNTER — Other Ambulatory Visit: Payer: Self-pay | Admitting: Family Medicine

## 2017-11-15 ENCOUNTER — Encounter: Payer: Self-pay | Admitting: Family Medicine

## 2017-11-15 ENCOUNTER — Other Ambulatory Visit: Payer: Self-pay

## 2017-11-15 ENCOUNTER — Ambulatory Visit (INDEPENDENT_AMBULATORY_CARE_PROVIDER_SITE_OTHER): Payer: Medicare Other | Admitting: Family Medicine

## 2017-11-15 VITALS — BP 130/84 | HR 79 | Temp 98.4°F | Wt 245.4 lb

## 2017-11-15 DIAGNOSIS — J209 Acute bronchitis, unspecified: Secondary | ICD-10-CM | POA: Diagnosis not present

## 2017-11-15 MED ORDER — LEVOFLOXACIN 500 MG PO TABS: 500.0000 mg | ORAL_TABLET | Freq: Every day | ORAL | 0 refills | Status: AC

## 2017-11-15 MED ORDER — VALSARTAN 160 MG PO TABS: 160.0000 mg | ORAL_TABLET | Freq: Every day | ORAL | 2 refills | Status: DC

## 2017-11-15 NOTE — Progress Notes (Signed)
   Subjective:    Patient ID: Guy Moore, male    DOB: Feb 21, 1934, 84 y.o.   MRN: [id number]  HPI Here for 5 days of chest tightness and coughing up green sputum. No fever.    Review of Systems  Constitutional: Negative.   HENT: Negative.   Eyes: Negative.   Respiratory: Positive for cough and chest tightness. Negative for shortness of breath.   Cardiovascular: Negative.        Objective:   Physical Exam  Constitutional: He appears well-developed and well-nourished.  HENT:  Right Ear: External ear normal.  Left Ear: External ear normal.  Nose: Nose normal.  Mouth/Throat: Oropharynx is clear and moist.  Eyes: Conjunctivae are normal.  Neck: No thyromegaly present.  Pulmonary/Chest: Effort normal and breath sounds normal. No respiratory distress.  Lymphadenopathy:    He has no cervical adenopathy.          Assessment & Plan:  Bronchitis, treat with Levaquin. Add Mucinex prn.  Alysia Penna, MD

## 2017-11-26 ENCOUNTER — Encounter: Payer: Self-pay | Admitting: Family Medicine

## 2017-11-26 NOTE — Telephone Encounter (Signed)
I would just observe for now and he can follow up prn

## 2017-11-30 ENCOUNTER — Other Ambulatory Visit: Payer: Self-pay | Admitting: Family Medicine

## 2017-12-03 NOTE — Telephone Encounter (Signed)
It will likely take some more time. No further antibiotics should be needed

## 2018-01-03 DIAGNOSIS — I482 Chronic atrial fibrillation: Secondary | ICD-10-CM | POA: Diagnosis not present

## 2018-01-03 DIAGNOSIS — Z950 Presence of cardiac pacemaker: Secondary | ICD-10-CM | POA: Diagnosis not present

## 2018-01-04 DIAGNOSIS — M47816 Spondylosis without myelopathy or radiculopathy, lumbar region: Secondary | ICD-10-CM | POA: Diagnosis not present

## 2018-02-05 ENCOUNTER — Other Ambulatory Visit: Payer: Self-pay | Admitting: Family Medicine

## 2018-02-07 NOTE — Telephone Encounter (Signed)
Pt only seen for acute visits since 07/2015.  Please advise.

## 2018-02-15 ENCOUNTER — Other Ambulatory Visit: Payer: Self-pay | Admitting: Pulmonary Disease

## 2018-02-21 ENCOUNTER — Encounter: Payer: Self-pay | Admitting: Family Medicine

## 2018-02-21 ENCOUNTER — Ambulatory Visit (INDEPENDENT_AMBULATORY_CARE_PROVIDER_SITE_OTHER): Payer: Medicare Other | Admitting: Family Medicine

## 2018-02-21 VITALS — BP 122/80 | HR 71 | Temp 98.3°F | Ht 70.0 in | Wt 249.6 lb

## 2018-02-21 DIAGNOSIS — H6121 Impacted cerumen, right ear: Secondary | ICD-10-CM | POA: Diagnosis not present

## 2018-02-21 NOTE — Progress Notes (Signed)
   Subjective:    Patient ID: Guy Moore, male    DOB: 12/05/1933, 84 y.o.   MRN: [id number]  HPI Here for decreased hearing in the right ear since taking a shower this morning. No pain. She cleans his ears out periodically with OTC ear wax removal kits and bulb syringes. He wears bilateral hearing aids.    Review of Systems  Constitutional: Negative.   HENT: Positive for hearing loss. Negative for congestion, ear discharge, ear pain, sinus pressure and sinus pain.   Eyes: Negative.   Respiratory: Negative.        Objective:   Physical Exam  Constitutional: He appears well-developed and well-nourished.  HENT:  Left Ear: External ear normal.  Nose: Nose normal.  Mouth/Throat: Oropharynx is clear and moist.  Right ear canal had a small amount of wax in it  Eyes: Conjunctivae are normal.  Pulmonary/Chest: Effort normal and breath sounds normal.  Lymphadenopathy:    He has no cervical adenopathy.          Assessment & Plan:  Cerumen impaction. This was removed wit a speculum. After that his hearing aid fit better and his hearing was back to baseline.  Alysia Penna, MD

## 2018-02-22 DIAGNOSIS — Z7901 Long term (current) use of anticoagulants: Secondary | ICD-10-CM | POA: Diagnosis not present

## 2018-02-22 DIAGNOSIS — S0991XA Unspecified injury of ear, initial encounter: Secondary | ICD-10-CM | POA: Diagnosis not present

## 2018-03-08 ENCOUNTER — Encounter: Payer: Self-pay | Admitting: Family Medicine

## 2018-03-08 ENCOUNTER — Ambulatory Visit (INDEPENDENT_AMBULATORY_CARE_PROVIDER_SITE_OTHER): Payer: Medicare Other | Admitting: Family Medicine

## 2018-03-08 VITALS — BP 120/76 | HR 82 | Temp 98.1°F | Ht 70.0 in | Wt 251.4 lb

## 2018-03-08 DIAGNOSIS — J449 Chronic obstructive pulmonary disease, unspecified: Secondary | ICD-10-CM

## 2018-03-08 DIAGNOSIS — I10 Essential (primary) hypertension: Secondary | ICD-10-CM

## 2018-03-08 DIAGNOSIS — R739 Hyperglycemia, unspecified: Secondary | ICD-10-CM

## 2018-03-08 DIAGNOSIS — I480 Paroxysmal atrial fibrillation: Secondary | ICD-10-CM

## 2018-03-08 LAB — POC URINALSYSI DIPSTICK (AUTOMATED)
BILIRUBIN UA: NEGATIVE
GLUCOSE UA: NEGATIVE
Ketones, UA: NEGATIVE
LEUKOCYTES UA: NEGATIVE
NITRITE UA: NEGATIVE
PH UA: 6 (ref 5.0–8.0)
Protein, UA: NEGATIVE
Spec Grav, UA: 1.025 (ref 1.010–1.025)
Urobilinogen, UA: 0.2 E.U./dL

## 2018-03-08 LAB — TSH: TSH: 1.83 u[IU]/mL (ref 0.35–4.50)

## 2018-03-08 LAB — BASIC METABOLIC PANEL
BUN: 19 mg/dL (ref 6–23)
CHLORIDE: 102 meq/L (ref 96–112)
CO2: 30 meq/L (ref 19–32)
Calcium: 9.1 mg/dL (ref 8.4–10.5)
Creatinine, Ser: 0.97 mg/dL (ref 0.40–1.50)
GFR: 78.7 mL/min (ref >60.00)
GLUCOSE: 88 mg/dL (ref 70–99)
POTASSIUM: 4.5 meq/L (ref 3.5–5.1)
SODIUM: 140 meq/L (ref 135–145)

## 2018-03-08 LAB — CBC WITH DIFFERENTIAL/PLATELET
BASOS PCT: 1.4 % (ref 0.0–3.0)
Basophils Absolute: 0 10*3/uL (ref 0.0–0.1)
EOS ABS: 0.1 10*3/uL (ref 0.0–0.7)
Eosinophils Relative: 3 % (ref 0.0–5.0)
HCT: 40.4 % (ref 39.0–52.0)
Hemoglobin: 13.7 g/dL (ref 13.0–17.0)
LYMPHS PCT: 15.9 % (ref 12.0–46.0)
Lymphs Abs: 0.5 10*3/uL — ABNORMAL LOW (ref 0.7–4.0)
MCHC: 33.8 g/dL (ref 30.0–36.0)
MCV: 87.7 fl (ref 78.0–100.0)
Monocytes Absolute: 0.4 10*3/uL (ref 0.1–1.0)
Monocytes Relative: 11.2 % (ref 3.0–12.0)
NEUTROS ABS: 2.3 10*3/uL (ref 1.4–7.7)
Neutrophils Relative %: 68.5 % (ref 43.0–77.0)
PLATELETS: 119 10*3/uL — AB (ref 150.0–400.0)
RBC: 4.61 Mil/uL (ref 4.22–5.81)
RDW: 16.3 % — AB (ref 11.5–15.5)
WBC: 3.3 10*3/uL — ABNORMAL LOW (ref 4.0–10.5)

## 2018-03-08 LAB — HEMOGLOBIN A1C: Hgb A1c MFr Bld: 6.5 % (ref 4.6–6.5)

## 2018-03-08 LAB — HEPATIC FUNCTION PANEL
ALT: 15 U/L (ref 0–53)
AST: 18 U/L (ref 0–37)
Albumin: 4.2 g/dL (ref 3.5–5.2)
Alkaline Phosphatase: 43 U/L (ref 39–117)
BILIRUBIN DIRECT: 0.2 mg/dL (ref 0.0–0.3)
BILIRUBIN TOTAL: 0.8 mg/dL (ref 0.2–1.2)
Total Protein: 6.5 g/dL (ref 6.0–8.3)

## 2018-03-08 NOTE — Progress Notes (Signed)
   Subjective:    Patient ID: Guy Moore, male    DOB: 1934/03/28, 84 y.o.   MRN: [id number]  HPI Here for one month of intermittent symptoms of posterior headache, mild SOB on exertion, and fatigue. No palpitations or chest pain. He last saw Dr. Noralee Stain in [place]. He had a normal pacemaker check last month. He actually feels better the past few days. He thinks the pollen outside and the heat may be bothering him. He has not had any lab work done in over a year.    Review of Systems  Constitutional: Positive for fatigue. Negative for fever.  Respiratory: Positive for shortness of breath. Negative for cough.   Cardiovascular: Negative.   Gastrointestinal: Negative.   Genitourinary: Negative.   Neurological: Positive for headaches.       Objective:   Physical Exam  Constitutional: He is oriented to person, place, and time. He appears well-developed and well-nourished. No distress.  Neck: No thyromegaly present.  Cardiovascular: Normal rate, regular rhythm, normal heart sounds and intact distal pulses.  Pulmonary/Chest: Effort normal and breath sounds normal. No stridor. No respiratory distress. He has no wheezes. He has no rales.  Abdominal: Soft. Bowel sounds are normal. He exhibits no distension and no mass. There is no tenderness. There is no rebound and no guarding. No hernia.  Musculoskeletal: He exhibits no edema.  Lymphadenopathy:    He has no cervical adenopathy.  Neurological: He is alert and oriented to person, place, and time.          Assessment & Plan:  He has a combination of symptoms which may or may not be related. He seems to be stable from a cardiac standpoint, his BP and heart rate are stable. His pacemaker is working well. He has COPD with asthma and he has not seen Dr. Halford Chessman for awhile. He will continue his current inhalers. Get labs today to rule out anemia, thyroid disease, diabetes, etc.  Alysia Penna, MD

## 2018-03-10 ENCOUNTER — Encounter: Payer: Self-pay | Admitting: Family Medicine

## 2018-03-11 ENCOUNTER — Encounter: Payer: Self-pay | Admitting: Family Medicine

## 2018-03-13 ENCOUNTER — Encounter: Payer: Self-pay | Admitting: Family Medicine

## 2018-03-15 ENCOUNTER — Ambulatory Visit: Payer: Self-pay | Admitting: Family Medicine

## 2018-03-15 NOTE — Telephone Encounter (Signed)
Please give him a call about his labs

## 2018-03-28 DIAGNOSIS — H6123 Impacted cerumen, bilateral: Secondary | ICD-10-CM | POA: Diagnosis not present

## 2018-03-29 DIAGNOSIS — Z950 Presence of cardiac pacemaker: Secondary | ICD-10-CM | POA: Diagnosis not present

## 2018-03-29 DIAGNOSIS — I495 Sick sinus syndrome: Secondary | ICD-10-CM | POA: Diagnosis not present

## 2018-04-12 DIAGNOSIS — M47816 Spondylosis without myelopathy or radiculopathy, lumbar region: Secondary | ICD-10-CM | POA: Diagnosis not present

## 2018-05-05 ENCOUNTER — Other Ambulatory Visit: Payer: Self-pay | Admitting: Family Medicine

## 2018-05-13 ENCOUNTER — Other Ambulatory Visit: Payer: Self-pay

## 2018-06-03 DIAGNOSIS — H353132 Nonexudative age-related macular degeneration, bilateral, intermediate dry stage: Secondary | ICD-10-CM | POA: Diagnosis not present

## 2018-06-15 ENCOUNTER — Encounter: Payer: Self-pay | Admitting: Family Medicine

## 2018-06-15 ENCOUNTER — Ambulatory Visit (INDEPENDENT_AMBULATORY_CARE_PROVIDER_SITE_OTHER): Payer: Medicare Other | Admitting: Family Medicine

## 2018-06-15 VITALS — BP 130/88 | HR 88 | Temp 98.3°F | Ht 70.0 in | Wt 252.4 lb

## 2018-06-15 DIAGNOSIS — J209 Acute bronchitis, unspecified: Secondary | ICD-10-CM

## 2018-06-15 MED ORDER — LEVOFLOXACIN 500 MG PO TABS: 500.0000 mg | ORAL_TABLET | Freq: Every day | ORAL | 0 refills | Status: AC

## 2018-06-15 NOTE — Progress Notes (Signed)
   Subjective:    Patient ID: Guy Moore, male    DOB: 09-Nov-1933, 84 y.o.   MRN: [id number]  HPI Here for 3 days of chest congestion and a dry cough. No fever. Using Mucinex.    Review of Systems  Constitutional: Negative.   HENT: Negative.   Eyes: Negative.   Respiratory: Positive for cough and chest tightness.   Cardiovascular: Negative.        Objective:   Physical Exam  Constitutional: He appears well-developed and well-nourished.  HENT:  Right Ear: External ear normal.  Left Ear: External ear normal.  Nose: Nose normal.  Mouth/Throat: Oropharynx is clear and moist.  Eyes: Conjunctivae are normal.  Neck: No thyromegaly present.  Pulmonary/Chest: Effort normal. No stridor. No respiratory distress. He has no wheezes. He has no rales.  Scattered rhonchi   Lymphadenopathy:    He has no cervical adenopathy.          Assessment & Plan:  Bronchitis, treat with Levaquin.  Alysia Penna, MD

## 2018-06-17 ENCOUNTER — Encounter: Payer: Self-pay | Admitting: Pulmonary Disease

## 2018-06-17 ENCOUNTER — Ambulatory Visit (INDEPENDENT_AMBULATORY_CARE_PROVIDER_SITE_OTHER): Payer: Medicare Other | Admitting: Pulmonary Disease

## 2018-06-17 VITALS — BP 130/80 | HR 72 | Ht 68.5 in | Wt 252.6 lb

## 2018-06-17 DIAGNOSIS — G4733 Obstructive sleep apnea (adult) (pediatric): Secondary | ICD-10-CM | POA: Diagnosis not present

## 2018-06-17 DIAGNOSIS — J449 Chronic obstructive pulmonary disease, unspecified: Secondary | ICD-10-CM

## 2018-06-17 DIAGNOSIS — J4520 Mild intermittent asthma, uncomplicated: Secondary | ICD-10-CM

## 2018-06-17 NOTE — Patient Instructions (Addendum)
Continue Flovent   Only use your albuterol as a rescue medication to be used if you can't catch your breath by resting or doing a relaxed purse lip breathing pattern.  - The less you use it, the better it will work when you need it. - Ok to use up to 2 puffs  every 4 hours if you must but call for immediate appointment if use goes up over your usual need - Don't leave home without it !!  (think of it like the spare tire for your car)   Continue Levaquin as prescribed by PCP        We recommend that you continue using your CPAP daily >>>Keep up the hard work using your device >>> Goal should be wearing this for the entire night that you are sleeping, at least 4 to 6 hours  Remember:  . Do not drive or operate heavy machinery if tired or drowsy.  . Please notify the supply company and office if you are unable to use your device regularly due to missing supplies or machine being broken.  . Work on maintaining a healthy weight and following your recommended nutrition plan  . Maintain proper daily exercise and movement  . Maintaining proper use of your device can also help improve management of other chronic illnesses such as: Blood pressure, blood sugars, and weight management.   BiPAP/ CPAP Cleaning:  >>>Clean weekly, with Dawn soap, and bottle brush.  Set up to air dry.    It is flu season:   >>>Remember to be washing your hands regularly, using hand sanitizer, be careful to use around herself with has contact with people who are sick will increase her chances of getting sick yourself. >>> Best ways to protect herself from the flu: Receive the yearly flu vaccine, practice good hand hygiene washing with soap and also using hand sanitizer when available, eat a nutritious meals, get adequate rest, hydrate appropriately   Please contact the office if your symptoms worsen or you have concerns that you are not improving.   Thank you for choosing [facility] for your healthcare,  and for allowing [place] to partner with you on your healthcare journey. I am thankful to be able to provide care to you today.   Wyn Quaker FNP-C         CPAP and BiPAP Information CPAP and BiPAP are methods of helping a person breathe with the use of air pressure. CPAP stands for "continuous positive airway pressure." BiPAP stands for "bi-level positive airway pressure." In both methods, air is blown through your nose or mouth and into your air passages to help you breathe well. CPAP and BiPAP use different amounts of pressure to blow air. With CPAP, the amount of pressure stays the same while you breathe in and out. With BiPAP, the amount of pressure is increased when you breathe in (inhale) so that you can take larger breaths. Your health care provider will recommend whether CPAP or BiPAP would be more helpful for you. Why are CPAP and BiPAP treatments used? CPAP or BiPAP can be helpful if you have:  Sleep apnea.  Chronic obstructive pulmonary disease (COPD).  Heart failure.  Medical conditions that weaken the muscles of the chest including muscular dystrophy, or neurological diseases such as amyotrophic lateral sclerosis (ALS).  Other problems that cause breathing to be weak, abnormal, or difficult.  CPAP is most commonly used for obstructive sleep apnea (OSA) to keep the airways from collapsing when the muscles  relax during sleep. How is CPAP or BiPAP administered? Both CPAP and BiPAP are provided by a small machine with a flexible plastic tube that attaches to a plastic mask. You wear the mask. Air is blown through the mask into your nose or mouth. The amount of pressure that is used to blow the air can be adjusted on the machine. Your health care provider will determine the pressure setting that should be used based on your individual needs. When should CPAP or BiPAP be used? In most cases, the mask only needs to be worn during sleep. Generally, the mask needs to be worn throughout  the night and during any daytime naps. People with certain medical conditions may also need to wear the mask at other times when they are awake. Follow instructions from your health care provider about when to use the machine. What are some tips for using the mask?  Because the mask needs to be snug, some people feel trapped or closed-in (claustrophobic) when first using the mask. If you feel this way, you may need to get used to the mask. One way to do this is by holding the mask loosely over your nose or mouth and then gradually applying the mask more snugly. You can also gradually increase the amount of time that you use the mask.  Masks are available in various types and sizes. Some fit over your mouth and nose while others fit over just your nose. If your mask does not fit well, talk with your health care provider about getting a different one.  If you are using a mask that fits over your nose and you tend to breathe through your mouth, a chin strap may be applied to help keep your mouth closed.  The CPAP and BiPAP machines have alarms that may sound if the mask comes off or develops a leak.  If you have trouble with the mask, it is very important that you talk with your health care provider about finding a way to make the mask easier to tolerate. Do not stop using the mask. Stopping the use of the mask could have a negative impact on your health. What are some tips for using the machine?  Place your CPAP or BiPAP machine on a secure table or stand near an electrical outlet.  Know where the on/off switch is located on the machine.  Follow instructions from your health care provider about how to set the pressure on your machine and when you should use it.  Do not eat or drink while the CPAP or BiPAP machine is on. Food or fluids could get pushed into your lungs by the pressure of the CPAP or BiPAP.  Do not smoke. Tobacco smoke residue can damage the machine.  For home use, CPAP and BiPAP  machines can be rented or purchased through home health care companies. Many different brands of machines are available. Renting a machine before purchasing may help you find out which particular machine works well for you.  Keep the CPAP or BiPAP machine and attachments clean. Ask your health care provider for specific instructions. Get help right away if:  You have redness or open areas around your nose or mouth where the mask fits.  You have trouble using the CPAP or BiPAP machine.  You cannot tolerate wearing the CPAP or BiPAP mask.  You have pain, discomfort, and bloating in your abdomen. Summary  CPAP and BiPAP are methods of helping a person breathe with the use of  air pressure.  Both CPAP and BiPAP are provided by a small machine with a flexible plastic tube that attaches to a plastic mask.  If you have trouble with the mask, it is very important that you talk with your health care provider about finding a way to make the mask easier to tolerate. This information is not intended to replace advice given to you by your health care provider. Make sure you discuss any questions you have with your health care provider. Document Released: 06/26/2004 Document Revised: 08/17/2016 Document Reviewed: 08/17/2016 Elsevier Interactive Patient Education  2017 [employer].

## 2018-06-17 NOTE — Assessment & Plan Note (Signed)

## 2018-06-17 NOTE — Assessment & Plan Note (Signed)
Continue Flovent   Only use your albuterol as a rescue medication to be used if you can't catch your breath by resting or doing a relaxed purse lip breathing pattern.  - The less you use it, the better it will work when you need it. - Ok to use up to 2 puffs  every 4 hours if you must but call for immediate appointment if use goes up over your usual need - Don't leave home without it !!  (think of it like the spare tire for your car)

## 2018-06-17 NOTE — Progress Notes (Signed)
@Patient  ID: Guy Moore, male    DOB: [birth year], 84 y.o.   MRN: [id number]  Chief Complaint  Patient presents with  . Follow-up    CPAP follow up    Referring provider: Laurey Morale, MD  HPI:  84 year old male former smoker followed in our office for COPD with asthma, allergic rhinitis and obstructive sleep apnea managed on CPAP  PMH: A. fib (managed on Xarelto), GERD Smoker/ Smoking History: Former s [employer].  Quit in 1979.  28 pack years Maintenance:  Flovent 29mcg Pt of: Dr. Halford Chessman  Recent [facility] Encounters:   05/31/17-office visit-[place] Plan: Cont CPAP, Continue Flovent, Follow-up in 1 year  06/17/2018  - Visit   84 year old patient presenting today for CPAP compliance visit.  Patient reporting that he is doing well on his CPAP and he has no complaints.  Patient prefers the full facemask that he is using.  Denies issues with daytime fatigue or symptoms.   CPAP compliance report showing 30 out of 30 days used.  With all 30 of those days greater than 4 hours.  Average usage days 9 hours and 31 minutes.  APAP settings 5-20, AHI 3.5.   Tests:  Sleep tests PSG 07/24/09 >> AHI 58, SpO2 low 74% Auto CPAP 10/31/16 to 11/29/16 >> used on 30 of 30 nights with average 8 hrs 56 min.  Average AHI 4.6 with median CPAP 9 and 95 th percentile CPAP 13 cm H2O  Pulmonary tests CT angio chest 12/23/07 >> mild emphysema, scarring RML FeNO 08/04/16 >> 50 PFT 12/01/16 >> FEV1 1.93 (68%), FEV1% 61, TLC 6.59 (93%), DLCO 58%, +BD   Chart Review:  06/15/18 -family med visit-bronchitis treated with Levaquin   Specialty Problems      Pulmonary Problems   ALLERGIC RHINITIS   OBSTRUCTIVE SLEEP APNEA    CPAP. Dr. Gwenette Greet.    Sleep tests PSG 07/24/09 >> AHI 58, SpO2 low 74% Auto CPAP 10/31/16 to 11/29/16 >> used on 30 of 30 nights with average 8 hrs 56 min.  Average AHI 4.6 with median CPAP 9 and 95 th percentile CPAP 13 cm H2O        Asthma   COPD with asthma ([place])   Pulmonary tests CT angio chest 12/23/07 >> mild emphysema, scarring RML FeNO 08/04/16 >> 50 PFT 12/01/16 >> FEV1 1.93 (68%), FEV1% 61, TLC 6.59 (93%), DLCO 58%, +BD          No Known Allergies  Immunization History  Administered Date(s) Administered  . Influenza Whole 08/12/2006, 10/21/2007, 07/10/2008, 07/19/2009, 07/12/2012  . Influenza, Seasonal, Injecte, Preservative Fre 08/04/2013  . Influenza-Unspecified 08/16/2015, 08/15/2016, 08/29/2017  . Pneumococcal Conjugate-13 01/17/2015  . Pneumococcal Polysaccharide-23 03/14/2013  . Td 10/09/2005  . Zoster 03/19/2009    Past Medical History:  Diagnosis Date  . Allergic rhinitis   . Arthritis   . Asthma   . Atrial fibrillation [facility])    sees Dr. [name] at [facility]   . Atrial flutter ([place])   . BCC (basal cell carcinoma of skin)    Nose  . COPD (chronic obstructive pulmonary disease) ([place])    sees Dr. [name]   . Depression   . Diverticulosis   . DIVERTICULOSIS, COLON 04/13/2007       . Erosive esophagitis   . Esophageal stricture   . Gastric ulcer   . Hearing loss    uses amplification  . Hemorrhoids   . Hiatal hernia   . HTN (hypertension)   .  Hyperglycemia   . 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 Other and unspecified hyperlipidemia   . Sleep apnea   . Type II or unspecified type diabetes mellitus without mention of complication, not stated as uncontrolled    Diet control     Tobacco History: Social History   Tobacco Use  Smoking Status Former Smoker  . Packs/day: 1.00  . Years: 28.00  . Pack years: 28.00  . Types: Cigarettes  . Last attempt to quit: 10/12/1977  . Years since quitting: 40.7  Smokeless Tobacco Never Used   Counseling given: Yes Continue not smoking  Outpatient Encounter Medications as of 06/17/2018  Medication Sig  . albuterol (PROVENTIL HFA;VENTOLIN HFA) 108 (90 Base) MCG/ACT inhaler Inhale 2 puffs into the lungs every 6 (six) hours as  needed for wheezing or shortness of breath.  Marland Kitchen amLODipine (NORVASC) 5 MG tablet TAKE 1 TABLET DAILY  . FLOVENT DISKUS 50 MCG/BLIST diskus inhaler USE 1 INHALATION TWICE A DAY  . KLOR-CON M20 20 MEQ tablet TAKE 1 TABLET DAILY  . levofloxacin (LEVAQUIN) 500 MG tablet Take 1 tablet (500 mg total) by mouth daily for 10 days.  Marland Kitchen MAGNESIUM GLUCONATE PO Take 1 tablet by mouth daily.   . Methylcellulose, Laxative, (CITRUCEL PO) Take 1 tablet by mouth daily at 12 noon.   Marland Kitchen omeprazole (PRILOSEC) 40 MG capsule TAKE 1 CAPSULE DAILY  . pravastatin (PRAVACHOL) 40 MG tablet Take 1 tablet (40 mg total) by mouth at bedtime.  . valsartan (DIOVAN) 160 MG tablet Take 1 tablet (160 mg total) by mouth daily.  Alveda Reasons 20 MG TABS tablet TAKE 1 TABLET DAILY   No facility-administered encounter medications on file as of 06/17/2018.     Review of Systems  Review of Systems  Constitutional: Positive for fatigue. Negative for activity change, chills, fever and unexpected weight change.  HENT: Negative for postnasal drip, rhinorrhea, sinus pressure, sinus pain, sneezing and sore throat.   Eyes: Negative.   Respiratory: Positive for cough (improved since 9/3, non productive ), chest tightness and shortness of breath. Negative for wheezing.   Cardiovascular: Negative for chest pain and palpitations.  Gastrointestinal: Negative for constipation, diarrhea, nausea and vomiting.  Endocrine: Negative.   Musculoskeletal: Negative.   Skin: Negative.   Neurological: Negative for dizziness and headaches.  Psychiatric/Behavioral: Negative.  Negative for dysphoric mood. The patient is not nervous/anxious.   All other systems reviewed and are negative.    Physical Exam  BP 130/80 (BP Location: Left Arm, Cuff Size: Normal)   Pulse 72   Ht 5' 8.5" (1.74 m)   Wt 252 lb 9.6 oz (114.6 kg)   SpO2 96%   BMI 37.85 kg/m   Wt Readings from Last 5 Encounters:  06/17/18 252 lb 9.6 oz (114.6 kg)  06/15/18 252 lb 6.4 oz (114.5  kg)  03/08/18 251 lb 6.4 oz (114 kg)  02/21/18 249 lb 9.6 oz (113.2 kg)  11/15/17 245 lb 6.4 oz (111.3 kg)     Physical Exam  Constitutional: He is oriented to person, place, and time and well-developed, well-nourished, and in no distress. No distress.  HENT:  Head: Normocephalic and atraumatic.  Right Ear: Hearing, tympanic membrane, external ear and ear canal normal.  Left Ear: Hearing, tympanic membrane, external ear and ear canal normal.  Nose: Nose normal. Right sinus exhibits no maxillary sinus tenderness and no frontal sinus tenderness. Left sinus exhibits no maxillary sinus tenderness and no frontal sinus tenderness.  Mouth/Throat: Uvula is midline and oropharynx is clear and moist. No oropharyngeal exudate.  Eyes: Pupils are equal, round, and reactive to light.  Neck: Normal range of motion. Neck supple. No JVD present.  Cardiovascular: Normal rate, regular rhythm and normal heart sounds.  Pulmonary/Chest: Effort normal. No accessory muscle usage. No respiratory distress. He has no decreased breath sounds. He has no wheezes. He has rhonchi (slight ).  Deep breath elicits cough   Abdominal: Soft. Bowel sounds are normal. There is no tenderness.  Musculoskeletal: Normal range of motion. He exhibits no edema.  Lymphadenopathy:    He has no cervical adenopathy.  Neurological: He is alert and oriented to person, place, and time. Gait normal.  Skin: Skin is warm and dry. He is not diaphoretic. No erythema.  Psychiatric: Mood, memory, affect and judgment normal.  Nursing note and vitals reviewed.     Lab Results:  CBC    Component Value Date/Time   WBC 3.3 (L) 03/08/2018 1159   RBC 4.61 03/08/2018 1159   HGB 13.7 03/08/2018 1159   HCT 40.4 03/08/2018 1159   PLT 119.0 (L) 03/08/2018 1159   MCV 87.7 03/08/2018 1159   MCH 28.3 12/03/2015 1236   MCHC 33.8 03/08/2018 1159   RDW 16.3 (H) 03/08/2018 1159   LYMPHSABS 0.5 (L) 03/08/2018 1159   MONOABS 0.4 03/08/2018 1159    EOSABS 0.1 03/08/2018 1159   BASOSABS 0.0 03/08/2018 1159    BMET    Component Value Date/Time   NA 140 03/08/2018 1159   NA 141 06/27/2015   K 4.5 03/08/2018 1159   CL 102 03/08/2018 1159   CO2 30 03/08/2018 1159   GLUCOSE 88 03/08/2018 1159   BUN 19 03/08/2018 1159   BUN 21 06/27/2015   CREATININE 0.97 03/08/2018 1159   CALCIUM 9.1 03/08/2018 1159   GFRNONAA >60 12/03/2015 1236   GFRAA >60 12/03/2015 1236    BNP    Component Value Date/Time   BNP 30.0 12/16/2014 1016    ProBNP No results found for: PROBNP  Imaging: No results found.    Assessment & Plan:   Pleasant 84 year old patient seen today for office visit.  Patient to continue obstructive sleep apnea therapy with CPAP.  Patient to continue Levaquin at this time.  Patient to follow-up with either PCP or our office if symptoms of bronchitis do not continue to gradually improve.  Follow-up in 1 year  OBSTRUCTIVE SLEEP APNEA We recommend that you continue using your CPAP daily >>>Keep up the hard work using your device >>> Goal should be wearing this for the entire night that you are sleeping, at least 4 to 6 hours  Remember:  . Do not drive or operate heavy machinery if tired or drowsy.  . Please notify the supply company and office if you are unable to use your device regularly due to missing supplies or machine being broken.  . Work on maintaining a healthy weight and following your recommended nutrition plan  . Maintain proper daily exercise and movement  . Maintaining proper use of your device can also help improve management of other chronic illnesses such as: Blood pressure, blood sugars, and weight management.   BiPAP/ CPAP Cleaning:  >>>Clean weekly, with Dawn soap, and bottle brush.  Set up to air dry.    Asthma Continue Flovent   Only use your albuterol as a rescue medication to be used if you can't catch your breath by resting or doing a relaxed purse lip breathing pattern.  -  The less  you use it, the better it will work when you need it. - Ok to use up to 2 puffs  every 4 hours if you must but call for immediate appointment if use goes up over your usual need - Don't leave home without it !!  (think of it like the spare tire for your car)       Lauraine Rinne, NP 06/17/2018

## 2018-06-20 NOTE — Progress Notes (Signed)
Reviewed and agree with assessment/plan.   Desirea Mizrahi, MD [facility] 10/07/2016, 12:24 PM Pager:  [phone]  

## 2018-06-29 DIAGNOSIS — I482 Chronic atrial fibrillation: Secondary | ICD-10-CM | POA: Diagnosis not present

## 2018-06-29 DIAGNOSIS — I495 Sick sinus syndrome: Secondary | ICD-10-CM | POA: Diagnosis not present

## 2018-06-29 DIAGNOSIS — Z950 Presence of cardiac pacemaker: Secondary | ICD-10-CM | POA: Diagnosis not present

## 2018-06-30 DIAGNOSIS — H5703 Miosis: Secondary | ICD-10-CM | POA: Diagnosis not present

## 2018-06-30 DIAGNOSIS — D3131 Benign neoplasm of right choroid: Secondary | ICD-10-CM | POA: Diagnosis not present

## 2018-06-30 DIAGNOSIS — H353132 Nonexudative age-related macular degeneration, bilateral, intermediate dry stage: Secondary | ICD-10-CM | POA: Diagnosis not present

## 2018-06-30 DIAGNOSIS — H2513 Age-related nuclear cataract, bilateral: Secondary | ICD-10-CM | POA: Diagnosis not present

## 2018-07-12 DIAGNOSIS — H6123 Impacted cerumen, bilateral: Secondary | ICD-10-CM | POA: Diagnosis not present

## 2018-07-14 DIAGNOSIS — H2513 Age-related nuclear cataract, bilateral: Secondary | ICD-10-CM | POA: Diagnosis not present

## 2018-07-14 DIAGNOSIS — D3131 Benign neoplasm of right choroid: Secondary | ICD-10-CM | POA: Diagnosis not present

## 2018-07-14 DIAGNOSIS — H353132 Nonexudative age-related macular degeneration, bilateral, intermediate dry stage: Secondary | ICD-10-CM | POA: Diagnosis not present

## 2018-07-14 DIAGNOSIS — H5703 Miosis: Secondary | ICD-10-CM | POA: Diagnosis not present

## 2018-07-18 DIAGNOSIS — H2181 Floppy iris syndrome: Secondary | ICD-10-CM | POA: Diagnosis not present

## 2018-07-18 DIAGNOSIS — H25811 Combined forms of age-related cataract, right eye: Secondary | ICD-10-CM | POA: Diagnosis not present

## 2018-07-18 DIAGNOSIS — H2511 Age-related nuclear cataract, right eye: Secondary | ICD-10-CM | POA: Diagnosis not present

## 2018-08-02 DIAGNOSIS — H2512 Age-related nuclear cataract, left eye: Secondary | ICD-10-CM | POA: Diagnosis not present

## 2018-08-02 DIAGNOSIS — M47816 Spondylosis without myelopathy or radiculopathy, lumbar region: Secondary | ICD-10-CM | POA: Diagnosis not present

## 2018-08-08 DIAGNOSIS — H21562 Pupillary abnormality, left eye: Secondary | ICD-10-CM | POA: Diagnosis not present

## 2018-08-08 DIAGNOSIS — H2181 Floppy iris syndrome: Secondary | ICD-10-CM | POA: Diagnosis not present

## 2018-08-08 DIAGNOSIS — H25812 Combined forms of age-related cataract, left eye: Secondary | ICD-10-CM | POA: Diagnosis not present

## 2018-08-08 DIAGNOSIS — H2512 Age-related nuclear cataract, left eye: Secondary | ICD-10-CM | POA: Diagnosis not present

## 2018-08-17 DIAGNOSIS — H353131 Nonexudative age-related macular degeneration, bilateral, early dry stage: Secondary | ICD-10-CM | POA: Diagnosis not present

## 2018-08-22 ENCOUNTER — Other Ambulatory Visit: Payer: Self-pay | Admitting: Family Medicine

## 2018-08-30 ENCOUNTER — Other Ambulatory Visit: Payer: Self-pay

## 2018-08-31 ENCOUNTER — Encounter (INDEPENDENT_AMBULATORY_CARE_PROVIDER_SITE_OTHER): Payer: Medicare Other | Admitting: Ophthalmology

## 2018-08-31 DIAGNOSIS — H353132 Nonexudative age-related macular degeneration, bilateral, intermediate dry stage: Secondary | ICD-10-CM

## 2018-08-31 DIAGNOSIS — H43813 Vitreous degeneration, bilateral: Secondary | ICD-10-CM | POA: Diagnosis not present

## 2018-08-31 DIAGNOSIS — I10 Essential (primary) hypertension: Secondary | ICD-10-CM | POA: Diagnosis not present

## 2018-08-31 DIAGNOSIS — H35033 Hypertensive retinopathy, bilateral: Secondary | ICD-10-CM | POA: Diagnosis not present

## 2018-08-31 DIAGNOSIS — D3131 Benign neoplasm of right choroid: Secondary | ICD-10-CM

## 2018-09-05 ENCOUNTER — Encounter: Payer: Self-pay | Admitting: Family Medicine

## 2018-09-05 MED ORDER — PRAVASTATIN SODIUM 40 MG PO TABS: 40.0000 mg | ORAL_TABLET | Freq: Every day | ORAL | 3 refills | Status: DC

## 2018-09-18 ENCOUNTER — Encounter: Payer: Self-pay | Admitting: Family Medicine

## 2018-09-19 ENCOUNTER — Other Ambulatory Visit: Payer: Self-pay

## 2018-09-19 MED ORDER — VALSARTAN 160 MG PO TABS: 160.0000 mg | ORAL_TABLET | Freq: Every day | ORAL | 3 refills | Status: DC

## 2018-09-19 MED ORDER — AMLODIPINE BESYLATE 5 MG PO TABS: 5.0000 mg | ORAL_TABLET | Freq: Every day | ORAL | 3 refills | Status: DC

## 2018-09-29 DIAGNOSIS — Z950 Presence of cardiac pacemaker: Secondary | ICD-10-CM | POA: Diagnosis not present

## 2018-09-29 DIAGNOSIS — I480 Paroxysmal atrial fibrillation: Secondary | ICD-10-CM | POA: Diagnosis not present

## 2018-11-02 ENCOUNTER — Encounter (INDEPENDENT_AMBULATORY_CARE_PROVIDER_SITE_OTHER): Payer: Medicare Other | Admitting: Ophthalmology

## 2018-11-02 DIAGNOSIS — I10 Essential (primary) hypertension: Secondary | ICD-10-CM | POA: Diagnosis not present

## 2018-11-02 DIAGNOSIS — D3131 Benign neoplasm of right choroid: Secondary | ICD-10-CM | POA: Diagnosis not present

## 2018-11-02 DIAGNOSIS — H35033 Hypertensive retinopathy, bilateral: Secondary | ICD-10-CM | POA: Diagnosis not present

## 2018-11-02 DIAGNOSIS — H353132 Nonexudative age-related macular degeneration, bilateral, intermediate dry stage: Secondary | ICD-10-CM

## 2018-11-02 DIAGNOSIS — H43813 Vitreous degeneration, bilateral: Secondary | ICD-10-CM | POA: Diagnosis not present

## 2018-11-07 ENCOUNTER — Ambulatory Visit (INDEPENDENT_AMBULATORY_CARE_PROVIDER_SITE_OTHER): Payer: Medicare Other | Admitting: Family Medicine

## 2018-11-07 ENCOUNTER — Encounter: Payer: Self-pay | Admitting: Family Medicine

## 2018-11-07 ENCOUNTER — Ambulatory Visit (INDEPENDENT_AMBULATORY_CARE_PROVIDER_SITE_OTHER): Payer: Medicare Other

## 2018-11-07 VITALS — BP 160/94 | HR 104 | Temp 99.9°F | Wt 250.5 lb

## 2018-11-07 DIAGNOSIS — J189 Pneumonia, unspecified organism: Secondary | ICD-10-CM

## 2018-11-07 DIAGNOSIS — R52 Pain, unspecified: Secondary | ICD-10-CM

## 2018-11-07 DIAGNOSIS — J181 Lobar pneumonia, unspecified organism: Secondary | ICD-10-CM

## 2018-11-07 DIAGNOSIS — R05 Cough: Secondary | ICD-10-CM | POA: Diagnosis not present

## 2018-11-07 LAB — POCT INFLUENZA A/B
INFLUENZA A, POC: NEGATIVE
Influenza B, POC: NEGATIVE

## 2018-11-07 MED ORDER — CEFTRIAXONE SODIUM 1 G IJ SOLR
1.0000 g | Freq: Once | INTRAMUSCULAR | Status: AC
  Administered 2018-11-07: 1 g via INTRAMUSCULAR

## 2018-11-07 MED ORDER — LEVOFLOXACIN 500 MG PO TABS: 500.0000 mg | ORAL_TABLET | Freq: Every day | ORAL | 0 refills | Status: AC

## 2018-11-07 MED ORDER — IPRATROPIUM-ALBUTEROL 0.5-2.5 (3) MG/3ML IN SOLN
3.0000 mL | Freq: Once | RESPIRATORY_TRACT | Status: AC
  Administered 2018-11-07: 3 mL via RESPIRATORY_TRACT

## 2018-11-07 NOTE — Progress Notes (Signed)
   Subjective:    Patient ID: Guy Moore, male    DOB: 19-Sep-1934, 85 y.o.   MRN: [id number]  HPI Here for 5 days of fever, chest tightness, SOB, wheezing, and coughing up yellow sputum. No chest pain. On Mucinex.    Review of Systems  Constitutional: Positive for chills and fever.  HENT: Negative.   Eyes: Negative.   Respiratory: Positive for cough, chest tightness, shortness of breath and wheezing.   Cardiovascular: Negative.        Objective:   Physical Exam Constitutional:      Comments: Coughing frequently   HENT:     Right Ear: Tympanic membrane and ear canal normal.     Left Ear: Tympanic membrane and ear canal normal.     Nose: Nose normal.     Mouth/Throat:     Pharynx: Oropharynx is clear.  Eyes:     Conjunctiva/sclera: Conjunctivae normal.  Cardiovascular:     Rate and Rhythm: Normal rate and regular rhythm.     Pulses: Normal pulses.     Heart sounds: Normal heart sounds.  Pulmonary:     Effort: Pulmonary effort is normal. No respiratory distress.     Comments: Scattered rhonchi and wheezes. He also has LLL rales. A CXR today is clear  Lymphadenopathy:     Cervical: No cervical adenopathy.  Neurological:     Mental Status: He is alert.           Assessment & Plan:  Early LLL pneumonia. He was treated with a Duoneb nebulization.  Given a shot of Rocephin. Given a course of Levaquin. Use the albuterol inhaler prn. Alysia Penna, MD

## 2018-11-07 NOTE — Addendum Note (Signed)
Addended by: Elie Confer on: 11/07/2018 04:52 PM   Modules accepted: Orders

## 2018-11-09 ENCOUNTER — Encounter (INDEPENDENT_AMBULATORY_CARE_PROVIDER_SITE_OTHER): Payer: Medicare Other | Admitting: Ophthalmology

## 2018-11-15 MED ORDER — METHYLPREDNISOLONE 4 MG PO TBPK: ORAL_TABLET | ORAL | 0 refills | Status: DC

## 2018-11-15 MED ORDER — ALBUTEROL SULFATE (2.5 MG/3ML) 0.083% IN NEBU: 2.5000 mg | INHALATION_SOLUTION | RESPIRATORY_TRACT | 5 refills | Status: DC | PRN

## 2018-11-15 NOTE — Addendum Note (Signed)
Addended by: Alysia Penna A on: 11/15/2018 03:02 PM   Modules accepted: Orders

## 2018-11-21 ENCOUNTER — Encounter (HOSPITAL_COMMUNITY): Payer: Self-pay | Admitting: *Deleted

## 2018-11-21 ENCOUNTER — Emergency Department (HOSPITAL_COMMUNITY)
Admission: EM | Admit: 2018-11-21 | Discharge: 2018-11-21 | Disposition: A | Discharge status: Home / Self Care | Payer: Medicare Other | Attending: Emergency Medicine | Admitting: Emergency Medicine

## 2018-11-21 ENCOUNTER — Emergency Department (HOSPITAL_COMMUNITY): Payer: Medicare Other

## 2018-11-21 DIAGNOSIS — R079 Chest pain, unspecified: Secondary | ICD-10-CM

## 2018-11-21 DIAGNOSIS — J449 Chronic obstructive pulmonary disease, unspecified: Secondary | ICD-10-CM | POA: Insufficient documentation

## 2018-11-21 DIAGNOSIS — Z87891 Personal history of nicotine dependence: Secondary | ICD-10-CM | POA: Diagnosis not present

## 2018-11-21 DIAGNOSIS — I10 Essential (primary) hypertension: Secondary | ICD-10-CM | POA: Diagnosis not present

## 2018-11-21 DIAGNOSIS — R0789 Other chest pain: Secondary | ICD-10-CM | POA: Insufficient documentation

## 2018-11-21 DIAGNOSIS — E119 Type 2 diabetes mellitus without complications: Secondary | ICD-10-CM | POA: Diagnosis not present

## 2018-11-21 DIAGNOSIS — I700 Atherosclerosis of aorta: Secondary | ICD-10-CM | POA: Diagnosis not present

## 2018-11-21 DIAGNOSIS — Z79899 Other long term (current) drug therapy: Secondary | ICD-10-CM | POA: Insufficient documentation

## 2018-11-21 DIAGNOSIS — R1013 Epigastric pain: Secondary | ICD-10-CM | POA: Diagnosis not present

## 2018-11-21 LAB — HEPATIC FUNCTION PANEL
ALT: 30 U/L (ref 0–44)
AST: 20 U/L (ref 15–41)
Albumin: 3.9 g/dL (ref 3.5–5.0)
Alkaline Phosphatase: 39 U/L (ref 38–126)
Bilirubin, Direct: 0.3 mg/dL — ABNORMAL HIGH (ref 0.0–0.2)
Indirect Bilirubin: 1 mg/dL — ABNORMAL HIGH (ref 0.3–0.9)
Total Bilirubin: 1.3 mg/dL — ABNORMAL HIGH (ref 0.3–1.2)
Total Protein: 6.6 g/dL (ref 6.5–8.1)

## 2018-11-21 LAB — CBC
HCT: 46.4 % (ref 39.0–52.0)
HEMOGLOBIN: 15.1 g/dL (ref 13.0–17.0)
MCH: 29.3 pg (ref 26.0–34.0)
MCHC: 32.5 g/dL (ref 30.0–36.0)
MCV: 90.1 fL (ref 80.0–100.0)
Platelets: 162 10*3/uL (ref 150–400)
RBC: 5.15 MIL/uL (ref 4.22–5.81)
RDW: 14.7 % (ref 11.5–15.5)
WBC: 4.8 10*3/uL (ref 4.0–10.5)
nRBC: 0 % (ref 0.0–0.2)

## 2018-11-21 LAB — URINALYSIS, ROUTINE W REFLEX MICROSCOPIC
Bilirubin Urine: NEGATIVE
Glucose, UA: NEGATIVE mg/dL
HGB URINE DIPSTICK: NEGATIVE
Ketones, ur: NEGATIVE mg/dL
Leukocytes, UA: NEGATIVE
Nitrite: NEGATIVE
PROTEIN: NEGATIVE mg/dL
Specific Gravity, Urine: >1.046 — ABNORMAL HIGH (ref 1.005–1.030)
pH: 5 (ref 5.0–8.0)

## 2018-11-21 LAB — BASIC METABOLIC PANEL
Anion gap: 10 (ref 5–15)
BUN: 20 mg/dL (ref 8–23)
CO2: 26 mmol/L (ref 22–32)
Calcium: 8.6 mg/dL — ABNORMAL LOW (ref 8.9–10.3)
Chloride: 101 mmol/L (ref 98–111)
Creatinine, Ser: 0.98 mg/dL (ref 0.61–1.24)
GFR calc Af Amer: >60 mL/min (ref >60)
Glucose, Bld: 116 mg/dL — ABNORMAL HIGH (ref 70–99)
Potassium: 4.1 mmol/L (ref 3.5–5.1)
Sodium: 137 mmol/L (ref 135–145)

## 2018-11-21 LAB — I-STAT TROPONIN, ED
TROPONIN I, POC: 0.01 ng/mL (ref 0.00–0.08)
TROPONIN I, POC: 0.01 ng/mL (ref 0.00–0.08)

## 2018-11-21 LAB — LIPASE, BLOOD: Lipase: 41 U/L (ref 11–51)

## 2018-11-21 MED ORDER — IOPAMIDOL (ISOVUE-370) INJECTION 76%
INTRAVENOUS | Status: AC
  Filled 2018-11-21: qty 100

## 2018-11-21 MED ORDER — SODIUM CHLORIDE 0.9% FLUSH
3.0000 mL | Freq: Once | INTRAVENOUS | Status: AC
  Administered 2018-11-21: 3 mL via INTRAVENOUS

## 2018-11-21 MED ORDER — IOPAMIDOL (ISOVUE-370) INJECTION 76%
100.0000 mL | Freq: Once | INTRAVENOUS | Status: AC | PRN
  Administered 2018-11-21: 100 mL via INTRAVENOUS

## 2018-11-21 NOTE — Discharge Instructions (Addendum)
The cause of your pain was not identified today. You had a CT scan that demonstrated diverticulosis, enlarged prostate and cyst on your kidneys. Please follow-up with your family doctor for recheck. Get rechecked immediately if you develop any new or concerning symptoms.

## 2018-11-21 NOTE — ED Provider Notes (Signed)
[facility] EMERGENCY DEPARTMENT Provider Note   CSN: [id number] Arrival date & time: 11/21/18  1249     History   Chief Complaint Chief Complaint  Patient presents with  . Chest Pain    HPI Guy Moore is a 85 y.o. male.  The history is provided by the patient and medical records. No language interpreter was used.  Chest Pain   Guy Moore is a 85 y.o. male who presents to the Emergency Department complaining of abdominal pain. He presents to the emergency department complaining of abdominal pain that began around 9 AM today. He was recently treated with Levaquin for pneumonia. Treatment was started two weeks ago and he finished the treatment a few days ago. He saw his PCP a week ago and was treated with additional course of prednisone, which he completed yesterday as well as albuterol for wheezing. He states that overall his cough and shortness of breath have significantly improved. He developed sudden onset pain across his entire upper abdomen/lower chest with associated pain in his mid back. Pain is described as the spasming sensation. It does wax and wane with no clear alleviating or worsening factors. He currently has no pain but when the pain is present it is very severe. He denies any fevers, cough, nausea, vomiting, diarrhea, dysuria. He does take Xarelto and has a history of atrial fibrillation. Past Medical History:  Diagnosis Date  . Allergic rhinitis   . Arthritis   . Asthma   . Atrial fibrillation [facility])    sees Dr. [name] at [facility]   . Atrial flutter ([place])   . BCC (basal cell carcinoma of skin)    Nose  . COPD (chronic obstructive pulmonary disease) ([place])    sees Dr. [name]   . Depression   . Diverticulosis   . DIVERTICULOSIS, COLON 04/13/2007       . Erosive esophagitis   . Esophageal stricture   . Gastric ulcer   . Hearing loss    uses amplification  . Hemorrhoids   . Hiatal hernia   . HTN  (hypertension)   . Hyperglycemia   . 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 Other and unspecified hyperlipidemia   . Sleep apnea   . Type II or unspecified type diabetes mellitus without mention of complication, not stated as uncontrolled    Diet control     Patient Active Problem List   Diagnosis Date Noted  . COPD with asthma ([place]) 03/08/2018  . Asthma 08/26/2016  . Hyperglycemia 05/29/2016  . Thoracic scoliosis 12/16/2015  . Thrombocytopenia ([place]) 04/12/2015  . History of basal cell cancer 06/27/2014  . Bleeding gums 04/15/2013  . GERD (gastroesophageal reflux disease) 08/31/2011  . Anticoagulant long-term use 08/31/2011  . OBSTRUCTIVE SLEEP APNEA 07/04/2009  . Atrial fibrillation ([place]) 04/19/2009  . ALLERGIC RHINITIS 04/14/2007  . Hyperlipidemia 04/13/2007  . Essential hypertension 04/13/2007    Past Surgical History:  Procedure Laterality Date  . CARDIAC ELECTROPHYSIOLOGY STUDY AND ABLATION     x 4 (sees Dr. [name] at [facility])  . NOSE SURGERY    . UVULOPALATOPHARYNGOPLASTY  1980's  . VASECTOMY  0277'A   w/complications        Home Medications    Prior to Admission medications   Medication Sig Start Date End Date Taking? Authorizing Provider  acetaminophen (TYLENOL) 500 MG tablet Take 500-1,000 mg by mouth every 6 (six) hours as needed (for pain).  Yes [provider]  albuterol (PROVENTIL HFA;VENTOLIN HFA) 108 (90 Base) MCG/ACT inhaler Inhale 2 puffs into the lungs every 6 (six) hours as needed for wheezing or shortness of breath. 05/31/17  Yes [name], MD  albuterol (PROVENTIL) (2.5 MG/3ML) 0.083% nebulizer solution Take 3 mLs (2.5 mg total) by nebulization every 4 (four) hours as needed for wheezing or shortness of breath. 11/15/18  Yes Laurey Morale, MD  amLODipine (NORVASC) 5 MG tablet Take 1 tablet (5 mg total) by mouth daily. Patient taking differently: Take 5 mg by mouth every evening.  09/19/18  Yes Laurey Morale, MD  FLOVENT DISKUS 50 MCG/BLIST diskus inhaler USE 1 INHALATION TWICE A DAY Patient taking differently: Inhale 1 puff into the lungs 2 (two) times daily.  02/15/18  Yes Sood, Elisabeth Cara, MD  KLOR-CON M20 20 MEQ tablet TAKE 1 TABLET DAILY Patient taking differently: Take 20 mEq by mouth daily.  05/05/18  Yes Laurey Morale, MD  MAGNESIUM GLUCONATE PO Take 1 tablet by mouth daily.    Yes [provider]  Methylcellulose, Laxative, (CITRUCEL PO) Take 1 tablet by mouth daily after breakfast.    Yes [provider]  Multiple Vitamins-Minerals (PRESERVISION AREDS 2) CAPS Take 1 capsule by mouth 2 (two) times daily.   Yes [provider]  omeprazole (PRILOSEC) 40 MG capsule TAKE 1 CAPSULE DAILY Patient taking differently: Take 40 mg by mouth daily as needed (for reflux symptoms).  02/08/18  Yes Laurey Morale, MD  pravastatin (PRAVACHOL) 40 MG tablet Take 1 tablet (40 mg total) by mouth at bedtime. 09/05/18  Yes Laurey Morale, MD  valsartan (DIOVAN) 160 MG tablet Take 1 tablet (160 mg total) by mouth daily. Patient taking differently: Take 160 mg by mouth every evening.  09/19/18  Yes Laurey Morale, MD  XARELTO 20 MG TABS tablet TAKE 1 TABLET DAILY Patient taking differently: Take 20 mg by mouth every evening.  05/05/18  Yes Laurey Morale, MD  methylPREDNISolone (MEDROL DOSEPAK) 4 MG TBPK tablet As directed Patient not taking: Reported on 11/21/2018 11/15/18   Laurey Morale, MD    Family History Family History  Problem Relation Age of Onset  . Stroke Mother   . Breast cancer Mother   . Heart disease Father   . Hypertension Father   . Cancer Father        Bladder  . Breast cancer Brother   . Atrial fibrillation Brother   . Tremor Brother   . Stroke Brother   . Colon cancer Neg Hx     Social History Social History   Tobacco Use  . Smoking status: Former Smoker    Packs/day: 1.00    Years: 28.00    Pack years: 28.00    Types: Cigarettes    Last attempt to quit:  10/12/1977    Years since quitting: 41.1  . Smokeless tobacco: Never Used  Substance Use Topics  . Alcohol use: No    Alcohol/week: 0.0 standard drinks  . Drug use: No     Allergies   Patient has no known allergies.   Review of Systems Review of Systems  Cardiovascular: Positive for chest pain.  All other systems reviewed and are negative.    Physical Exam Updated Vital Signs BP 133/76 (BP Location: Right Arm)   Pulse 72   Temp 98 F (36.7 C) (Oral)   Resp 15   SpO2 99%   Physical Exam Vitals signs and nursing note reviewed.  Constitutional:      Appearance: He is well-developed.  HENT:     Head: Normocephalic and atraumatic.  Cardiovascular:     Rate and Rhythm: Normal rate and regular rhythm.     Heart sounds: No murmur.  Pulmonary:     Effort: Pulmonary effort is normal. No respiratory distress.     Breath sounds: Normal breath sounds.  Abdominal:     Palpations: Abdomen is soft.     Tenderness: There is no abdominal tenderness. There is no guarding or rebound.  Musculoskeletal:        General: No swelling or tenderness.     Comments: 2+ DP pulses bilaterally. No midline C, T, L spine tenderness  Skin:    General: Skin is warm and dry.     Capillary Refill: Capillary refill takes less than 2 seconds.  Neurological:     Mental Status: He is alert and oriented to person, place, and time.  Psychiatric:        Mood and Affect: Mood normal.        Behavior: Behavior normal.      ED Treatments / Results  Labs (all labs ordered are listed, but only abnormal results are displayed) Labs Reviewed  BASIC METABOLIC PANEL - Abnormal; Notable for the following components:      Result Value   Glucose, Bld 116 (*)    Calcium 8.6 (*)    All other components within normal limits  HEPATIC FUNCTION PANEL - Abnormal; Notable for the following components:   Total Bilirubin 1.3 (*)    Bilirubin, Direct 0.3 (*)    Indirect Bilirubin 1.0 (*)    All other components  within normal limits  URINALYSIS, ROUTINE W REFLEX MICROSCOPIC - Abnormal; Notable for the following components:   Specific Gravity, Urine >1.046 (*)    All other components within normal limits  URINE CULTURE  CBC  LIPASE, BLOOD  I-STAT TROPONIN, ED  I-STAT TROPONIN, ED    EKG EKG Interpretation  Date/Time:  Monday November 21 2018 16:11:25 EST Ventricular Rate:  70 PR Interval:    QRS Duration: 158 QT Interval:  431 QTC Calculation: 466 R Axis:   95 Text Interpretation:  Atrial flutter with varied AV block, Nonspecific intraventricular conduction delay Probable anteroseptal infarct, recent Lateral leads are also involved Confirmed by Quintella Reichert [phone]) on 11/21/2018 4:26:30 PM   Radiology Dg Chest 2 View  Result Date: 11/21/2018 CLINICAL DATA:  Weakness, chest pain today. Pneumonia less than 2 weeks ago. EXAM: CHEST - 2 VIEW COMPARISON:  Chest x-rays dated 11/07/2018 and 03/08/2017. FINDINGS: Heart size and mediastinal contours are stable. LEFT chest wall pacemaker/ICD leads appear stable in position. Mild scarring/atelectasis at the LEFT lung base. Lungs are otherwise clear. No pleural effusion or pneumothorax seen. No acute or suspicious osseous finding. IMPRESSION: No active cardiopulmonary disease. No evidence of pneumonia or pulmonary edema. Electronically Signed   By: Franki Cabot M.D.   On: 11/21/2018 13:30   Ct Angio Chest/abd/pel For Dissection W And/or W/wo  Result Date: 11/21/2018 CLINICAL DATA:  Severe pain in the upper chest, back and abdomen since this morning. EXAM: CT ANGIOGRAPHY CHEST, ABDOMEN AND PELVIS TECHNIQUE: Multidetector CT imaging through the chest, abdomen and pelvis was performed using the standard protocol during bolus administration of intravenous contrast. Multiplanar reconstructed images and MIPs were obtained and reviewed to evaluate the vascular anatomy. CONTRAST:  115mL ISOVUE-370 IOPAMIDOL (ISOVUE-370) INJECTION 76% COMPARISON:  Current chest  radiographs. FINDINGS: CTA CHEST FINDINGS Cardiovascular:  Heart normal in size. Trace pericardial fluid. Three-vessel coronary artery calcifications. Pacemaker with leads in the right atrium and ventricle. There are mild aortic atherosclerotic calcifications. Thoracic aorta is normal in caliber. No dissection. Mild atherosclerosis. Aortic arch branch vessels are widely patent. Mediastinum/Nodes: No neck base, axillary, mediastinal or hilar masses or enlarged lymph nodes. Trachea is unremarkable. Esophagus is unremarkable. Lungs/Pleura: Mild centrilobular emphysema. There is also mild mosaic perfusion suggesting small airways disease. Mild basilar subsegmental atelectasis. No evidence of pneumonia or pulmonary edema. No mass or suspicious nodule. No pleural effusion or pneumothorax. Musculoskeletal: No fracture or acute finding. No osteoblastic or osteolytic lesions. Review of the MIP images confirms the above findings. CTA ABDOMEN AND PELVIS FINDINGS VASCULAR Aorta: Atherosclerotic disease without significant stenosis. No dissection. No aneurysm. Celiac: Patent without evidence of aneurysm, dissection, vasculitis or significant stenosis. SMA: Patent without evidence of aneurysm, dissection, vasculitis or significant stenosis. Renals: Both renal arteries are patent without evidence of aneurysm, dissection, vasculitis, fibromuscular dysplasia or significant stenosis. IMA: Patent without significant stenosis. Inflow: Patent without evidence of aneurysm, dissection, vasculitis or significant stenosis. Veins: No obvious venous abnormality within the limitations of this arterial phase study. Review of the MIP images confirms the above findings. NON-VASCULAR Hepatobiliary: No focal liver abnormality is seen. No gallstones, gallbladder wall thickening, or biliary dilatation. Pancreas: Unremarkable. No pancreatic ductal dilatation or surrounding inflammatory changes. Spleen: Normal in size without focal abnormality.  Adrenals/Urinary Tract: No adrenal masses. Bilateral renal cortical thinning. 3 mm nonobstructing stone, midpole right kidney. Low-density renal masses bilaterally consistent with cysts. No hydronephrosis. Normal ureters. Bladder is unremarkable. Stomach/Bowel: Stomach is unremarkable. Small bowel and colon are normal in caliber. No wall thickening or inflammation. There are numerous colonic diverticula mostly along the sigmoid. No diverticulitis. Normal appendix visualized. Lymphatic: No lymphadenopathy. Reproductive: Mildly enlarged prostate, measuring 5.2 x 5.6 x 5.2 cm. Other: No abdominal wall hernia.  No ascites. Musculoskeletal: No fracture or acute finding. No osteoblastic or osteolytic lesions. Disk degenerative changes throughout the lumbar spine with facet degenerative changes in the lower lumbar spine. Review of the MIP images confirms the above findings. IMPRESSION: CTA FINDINGS 1. No aortic dissection or aneurysm. 2. Thoracoabdominal aortic atherosclerosis without significant stenosis. No stenosis of the aortic branch vessels. NON CTA FINDINGS 1. No acute findings in the chest. 2. Three-vessel coronary artery calcifications. Emphysema. Mild lung base atelectasis. Probable small airways disease reflected by mild mosaic attenuation. 3. No acute findings within the abdomen or pelvis. 4. Numerous left colon diverticula, mostly along the sigmoid. No diverticulitis. Low-density renal masses consistent with cysts. Single nonobstructing stone in the right kidney. 5. Prostatic enlargement. Electronically Signed   By: Lajean Manes M.D.   On: 11/21/2018 17:14    Procedures Procedures (including critical care time)  Medications Ordered in ED Medications  sodium chloride flush (NS) 0.9 % injection 3 mL (3 mLs Intravenous Given 11/21/18 1722)  iopamidol (ISOVUE-370) 76 % injection 100 mL (100 mLs Intravenous Contrast Given 11/21/18 1651)     Initial Impression / Assessment and Plan / ED Course  I have  reviewed the triage vital signs and the nursing notes.  Pertinent labs & imaging results that were available during my care of the patient were reviewed by me and considered in my medical decision making (see chart for details).     Patient here for evaluation of abdominal pain/back pain, currently resolved on ED evaluation. Labs with minimal elevation in his bilirubin, otherwise no acute abnormality. EKG without acute ischemic changes,  troponin is negative. CTA obtained given description of pain and CTA is negative for dissection, obstructing stone, PE, pneumonia. Counseled patient on unclear source of his symptoms. Discussed importance of PCP follow-up as well as return precautions.  Final Clinical Impressions(s) / ED Diagnoses   Final diagnoses:  Nonspecific chest pain  Epigastric pain    ED Discharge Orders    None       Quintella Reichert, MD 11/21/18 2346

## 2018-11-21 NOTE — ED Triage Notes (Signed)
Pt in c/o mid back & mid chest pain onset today while laying down reports recent completion of antibiotics for pneumonia, denies n/v/d, A&O x4, denies SOB

## 2018-11-22 ENCOUNTER — Encounter: Payer: Self-pay | Admitting: Family Medicine

## 2018-11-22 LAB — URINE CULTURE: Culture: NO GROWTH

## 2018-11-24 NOTE — Telephone Encounter (Signed)
No I do not think he needs to see me unless things change

## 2018-11-24 NOTE — Telephone Encounter (Signed)
Dr. Fry please advise. Thanks  

## 2018-12-06 DIAGNOSIS — H6123 Impacted cerumen, bilateral: Secondary | ICD-10-CM | POA: Diagnosis not present

## 2018-12-16 ENCOUNTER — Encounter: Payer: Self-pay | Admitting: Family Medicine

## 2018-12-16 ENCOUNTER — Ambulatory Visit (INDEPENDENT_AMBULATORY_CARE_PROVIDER_SITE_OTHER): Payer: Medicare Other | Admitting: Family Medicine

## 2018-12-16 VITALS — BP 130/82 | HR 77 | Temp 97.8°F | Wt 248.4 lb

## 2018-12-16 DIAGNOSIS — R42 Dizziness and giddiness: Secondary | ICD-10-CM | POA: Diagnosis not present

## 2018-12-16 DIAGNOSIS — Z8701 Personal history of pneumonia (recurrent): Secondary | ICD-10-CM

## 2018-12-16 DIAGNOSIS — R5383 Other fatigue: Secondary | ICD-10-CM | POA: Diagnosis not present

## 2018-12-16 DIAGNOSIS — I480 Paroxysmal atrial fibrillation: Secondary | ICD-10-CM | POA: Diagnosis not present

## 2018-12-16 DIAGNOSIS — I10 Essential (primary) hypertension: Secondary | ICD-10-CM

## 2018-12-16 DIAGNOSIS — J449 Chronic obstructive pulmonary disease, unspecified: Secondary | ICD-10-CM

## 2018-12-16 NOTE — Progress Notes (Signed)
   Subjective:    Patient ID: Guy Moore, male    DOB: 10/18/1933, 85 y.o.   MRN: [id number]  HPI Here with concerns about fatigue, SOB, and occasional dizziness. He was seen here on 11-07-18 for a LLL pneumonia and was treated with Levaquin, prednisone, and a nebulizer. He was then see in the ER on 11-21-18 for upper abdominal pains. His labs that day were all normal, including a CBC. His EXG and CXR were normal. Even a chest CT angiogram was normal, proving that the pneumonia had resolved. No etiology was found for the abdominal pain, but he has no more problems with this. He denies any recent fevers or cough or chest pain, but he still has some generalized fatigue. He tries to walk for exercise and he cannot go for 3 miles like he used to. He gets tired and out of breath easily. He also mentions some occasional lightheadedness or dizziness, often when he stand up from a sitting or lying position. No other neurologic deficits.    Review of Systems  Constitutional: Positive for fatigue. Negative for fever.  HENT: Negative.   Eyes: Negative.   Respiratory: Positive for shortness of breath. Negative for cough, chest tightness and wheezing.   Cardiovascular: Negative.   Gastrointestinal: Negative.   Neurological: Positive for dizziness and light-headedness. Negative for tremors, seizures, syncope, facial asymmetry, speech difficulty, numbness and headaches.       Objective:   Physical Exam Constitutional:      Appearance: Normal appearance.  Neck:     Vascular: No carotid bruit.  Cardiovascular:     Rate and Rhythm: Normal rate and regular rhythm.     Pulses: Normal pulses.     Heart sounds: Normal heart sounds.  Pulmonary:     Effort: Pulmonary effort is normal. No respiratory distress.     Breath sounds: Normal breath sounds. No stridor. No wheezing, rhonchi or rales.  Lymphadenopathy:     Cervical: No cervical adenopathy.  Neurological:     General: No focal deficit present.    Mental Status: He is alert. Mental status is at baseline.           Assessment & Plan:  His pneumonia has resolved, but I reminded him that it can take several months to get his strength back after this. His lungs and heart seem to be fine. In light of the dizziness he describes, we will set up carotid dopplers soon.  Alysia Penna, MD

## 2018-12-29 DIAGNOSIS — I480 Paroxysmal atrial fibrillation: Secondary | ICD-10-CM | POA: Diagnosis not present

## 2018-12-29 DIAGNOSIS — I495 Sick sinus syndrome: Secondary | ICD-10-CM | POA: Diagnosis not present

## 2018-12-29 DIAGNOSIS — Z950 Presence of cardiac pacemaker: Secondary | ICD-10-CM | POA: Diagnosis not present

## 2019-01-09 ENCOUNTER — Encounter (HOSPITAL_COMMUNITY): Payer: Medicare Other

## 2019-02-09 ENCOUNTER — Ambulatory Visit (HOSPITAL_COMMUNITY)
Admission: RE | Admit: 2019-02-09 | Discharge: 2019-02-09 | Disposition: A | Discharge status: Home / Self Care | Payer: Medicare Other | Source: Ambulatory Visit | Attending: Cardiovascular Disease | Admitting: Cardiovascular Disease

## 2019-02-09 ENCOUNTER — Other Ambulatory Visit: Payer: Self-pay

## 2019-02-09 DIAGNOSIS — R42 Dizziness and giddiness: Secondary | ICD-10-CM | POA: Insufficient documentation

## 2019-03-28 DIAGNOSIS — D3131 Benign neoplasm of right choroid: Secondary | ICD-10-CM | POA: Diagnosis not present

## 2019-03-28 DIAGNOSIS — H04123 Dry eye syndrome of bilateral lacrimal glands: Secondary | ICD-10-CM | POA: Diagnosis not present

## 2019-03-28 DIAGNOSIS — Z961 Presence of intraocular lens: Secondary | ICD-10-CM | POA: Diagnosis not present

## 2019-03-28 DIAGNOSIS — H353131 Nonexudative age-related macular degeneration, bilateral, early dry stage: Secondary | ICD-10-CM | POA: Diagnosis not present

## 2019-03-30 ENCOUNTER — Other Ambulatory Visit: Payer: Self-pay

## 2019-03-30 ENCOUNTER — Encounter: Payer: Self-pay | Admitting: Internal Medicine

## 2019-03-30 ENCOUNTER — Ambulatory Visit (INDEPENDENT_AMBULATORY_CARE_PROVIDER_SITE_OTHER): Payer: Medicare Other | Admitting: Internal Medicine

## 2019-03-30 VITALS — BP 150/80 | HR 97 | Temp 98.3°F | Wt 248.9 lb

## 2019-03-30 DIAGNOSIS — H109 Unspecified conjunctivitis: Secondary | ICD-10-CM | POA: Diagnosis not present

## 2019-03-30 DIAGNOSIS — Z950 Presence of cardiac pacemaker: Secondary | ICD-10-CM | POA: Diagnosis not present

## 2019-03-30 DIAGNOSIS — I480 Paroxysmal atrial fibrillation: Secondary | ICD-10-CM | POA: Diagnosis not present

## 2019-03-30 MED ORDER — ERYTHROMYCIN 5 MG/GM OP OINT: 1.0000 "application " | TOPICAL_OINTMENT | Freq: Two times a day (BID) | OPHTHALMIC | 0 refills | Status: AC

## 2019-03-30 NOTE — Patient Instructions (Signed)
-  Apply ointment twice a day to left upper eyelid with clean hands.  -Follow up in 7-10 days if no improvement.

## 2019-03-30 NOTE — Progress Notes (Signed)
Acute Office Visit     CC/Reason for Visit: Left eye swelling  HPI: Guy Moore is a 85 y.o. male who is coming in today for the above mentioned reasons.  He visited his ophthalmologist on Tuesday for his regular eye checkup, was told everything looked good and he had no complaints at that time.  Since yesterday he has been having a feeling of "tightness and pressure" of his left eye, his wife believes it is swollen.  He denies pain, discharge, vision changes, fever.   Past Medical/Surgical History: Past Medical History:  Diagnosis Date  . Allergic rhinitis   . Arthritis   . Asthma   . Atrial fibrillation [facility])    sees Dr. [name] at [facility]   . Atrial flutter ([place])   . BCC (basal cell carcinoma of skin)    Nose  . COPD (chronic obstructive pulmonary disease) ([place])    sees Dr. [name]   . Depression   . Diverticulosis   . DIVERTICULOSIS, COLON 04/13/2007       . Erosive esophagitis   . Esophageal stricture   . Gastric ulcer   . Hearing loss    uses amplification  . Hemorrhoids   . Hiatal hernia   . HTN (hypertension)   . Hyperglycemia   . 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 Other and unspecified hyperlipidemia   . Sleep apnea   . Type II or unspecified type diabetes mellitus without mention of complication, not stated as uncontrolled    Diet control     Past Surgical History:  Procedure Laterality Date  . CARDIAC ELECTROPHYSIOLOGY STUDY AND ABLATION     x 4 (sees Dr. [name] at [facility])  . NOSE SURGERY    . UVULOPALATOPHARYNGOPLASTY  1980's  . VASECTOMY  4098'J   w/complications    Social History:  reports that he quit smoking about 41 years ago. His smoking use included cigarettes. He has a 28.00 pack-year smoking history. He has never used smokeless tobacco. He reports that he does not drink alcohol or use drugs.  Allergies: No Known Allergies  Family History:  Family History   Problem Relation Age of Onset  . Stroke Mother   . Breast cancer Mother   . Heart disease Father   . Hypertension Father   . Cancer Father        Bladder  . Breast cancer Brother   . Atrial fibrillation Brother   . Tremor Brother   . Stroke Brother   . Colon cancer Neg Hx      Current Outpatient Medications:  .  acetaminophen (TYLENOL) 500 MG tablet, Take 500-1,000 mg by mouth every 6 (six) hours as needed (for pain)., Disp: , Rfl:  .  albuterol (PROVENTIL HFA;VENTOLIN HFA) 108 (90 Base) MCG/ACT inhaler, Inhale 2 puffs into the lungs every 6 (six) hours as needed for wheezing or shortness of breath., Disp: 3 Inhaler, Rfl: 3 .  albuterol (PROVENTIL) (2.5 MG/3ML) 0.083% nebulizer solution, Take 3 mLs (2.5 mg total) by nebulization every 4 (four) hours as needed for wheezing or shortness of breath., Disp: 75 mL, Rfl: 5 .  amLODipine (NORVASC) 5 MG tablet, Take 1 tablet (5 mg total) by mouth daily. (Patient taking differently: Take 5 mg by mouth every evening. ), Disp: 90 tablet, Rfl: 3 .  FLOVENT DISKUS 50 MCG/BLIST diskus inhaler, USE 1 INHALATION TWICE A DAY (Patient taking differently: Inhale 1 puff  into the lungs 2 (two) times daily. ), Disp: 180 Inhaler, Rfl: 3 .  KLOR-CON M20 20 MEQ tablet, TAKE 1 TABLET DAILY (Patient taking differently: Take 20 mEq by mouth daily. ), Disp: 90 tablet, Rfl: 3 .  MAGNESIUM GLUCONATE PO, Take 1 tablet by mouth daily. , Disp: , Rfl:  .  Methylcellulose, Laxative, (CITRUCEL PO), Take 1 tablet by mouth daily after breakfast. , Disp: , Rfl:  .  Multiple Vitamins-Minerals (PRESERVISION AREDS 2) CAPS, Take 1 capsule by mouth 2 (two) times daily., Disp: , Rfl:  .  omeprazole (PRILOSEC) 40 MG capsule, TAKE 1 CAPSULE DAILY (Patient taking differently: Take 40 mg by mouth daily as needed (for reflux symptoms). ), Disp: 90 capsule, Rfl: 3 .  pravastatin (PRAVACHOL) 40 MG tablet, Take 1 tablet (40 mg total) by mouth at bedtime., Disp: 90 tablet, Rfl: 3 .  valsartan  (DIOVAN) 160 MG tablet, Take 1 tablet (160 mg total) by mouth daily. (Patient taking differently: Take 160 mg by mouth every evening. ), Disp: 90 tablet, Rfl: 3 .  XARELTO 20 MG TABS tablet, TAKE 1 TABLET DAILY (Patient taking differently: Take 20 mg by mouth every evening. ), Disp: 90 tablet, Rfl: 3 .  erythromycin ophthalmic ointment, Place 1 application into the left eye 2 (two) times a day for 7 days., Disp: 14 g, Rfl: 0  Review of Systems:  Constitutional: Denies fever, chills, diaphoresis, appetite change and fatigue.  HEENT: Denies photophobia, hearing loss, ear pain, congestion, sore throat, rhinorrhea, sneezing, mouth sores, trouble swallowing, neck pain, neck stiffness and tinnitus.   Respiratory: Denies SOB, DOE, cough, chest tightness,  and wheezing.   Cardiovascular: Denies chest pain, palpitations and leg swelling.  Gastrointestinal: Denies nausea, vomiting, abdominal pain, diarrhea, constipation, blood in stool and abdominal distention.  Genitourinary: Denies dysuria, urgency, frequency, hematuria, flank pain and difficulty urinating.  Endocrine: Denies: hot or cold intolerance, sweats, changes in hair or nails, polyuria, polydipsia. Musculoskeletal: Denies myalgias, back pain, joint swelling, arthralgias and gait problem.  Skin: Denies pallor, rash and wound.  Neurological: Denies dizziness, seizures, syncope, weakness, light-headedness, numbness and headaches.  Hematological: Denies adenopathy. Easy bruising, personal or family bleeding history  Psychiatric/Behavioral: Denies suicidal ideation, mood changes, confusion, nervousness, sleep disturbance and agitation    Physical Exam: Vitals:   03/30/19 1455  BP: (!) 150/80  Pulse: 97  Temp: 98.3 F (36.8 C)  TempSrc: Oral  SpO2: 97%  Weight: 248 lb 14.4 oz (112.9 kg)    Body mass index is 37.29 kg/m.   Constitutional: NAD, calm, comfortable Eyes: Pupils are equally round and reactive to light, left upper eyelid is  erythematous and swollen.  There does not appear to be any foreign body. ENMT: Mucous membranes are moist. Psychiatric: Normal judgment and insight. Alert and oriented x 3. Normal mood.    Impression and Plan:  Conjunctivitis of left eye, unspecified conjunctivitis type  -There is mild edema and erythema of the left upper eyelid only, not visible from a distance, there is also mild conjunctival injection. -Not totally clear on the etiology but suspect it could be a mild form of conjunctivitis. -Even though not 100% clear if this is bacterial conjunctivitis, will give a 7-day course of erythromycin ointment, advised to follow-up with Dr. Sarajane Jews or with ophthalmology if no improvement in about 7 to 10 days   Patient Instructions  -Apply ointment twice a day to left upper eyelid with clean hands.  -Follow up in 7-10 days if no improvement.  Estela Hernandez Acosta, MD [facility]   

## 2019-04-06 DIAGNOSIS — Z950 Presence of cardiac pacemaker: Secondary | ICD-10-CM | POA: Diagnosis not present

## 2019-04-06 DIAGNOSIS — I495 Sick sinus syndrome: Secondary | ICD-10-CM | POA: Diagnosis not present

## 2019-04-13 ENCOUNTER — Other Ambulatory Visit: Payer: Self-pay | Admitting: Pulmonary Disease

## 2019-04-28 ENCOUNTER — Encounter: Payer: Self-pay | Admitting: Family Medicine

## 2019-04-28 ENCOUNTER — Ambulatory Visit (INDEPENDENT_AMBULATORY_CARE_PROVIDER_SITE_OTHER): Payer: Medicare Other | Admitting: Family Medicine

## 2019-04-28 ENCOUNTER — Other Ambulatory Visit: Payer: Self-pay | Admitting: Internal Medicine

## 2019-04-28 ENCOUNTER — Other Ambulatory Visit: Payer: Self-pay

## 2019-04-28 DIAGNOSIS — R0982 Postnasal drip: Secondary | ICD-10-CM

## 2019-04-28 DIAGNOSIS — R5383 Other fatigue: Secondary | ICD-10-CM

## 2019-04-28 DIAGNOSIS — J3489 Other specified disorders of nose and nasal sinuses: Secondary | ICD-10-CM

## 2019-04-28 DIAGNOSIS — Z20828 Contact with and (suspected) exposure to other viral communicable diseases: Secondary | ICD-10-CM | POA: Diagnosis not present

## 2019-04-28 DIAGNOSIS — Z20822 Contact with and (suspected) exposure to covid-19: Secondary | ICD-10-CM

## 2019-04-28 DIAGNOSIS — R05 Cough: Secondary | ICD-10-CM | POA: Diagnosis not present

## 2019-04-28 DIAGNOSIS — R059 Cough, unspecified: Secondary | ICD-10-CM

## 2019-04-28 NOTE — Progress Notes (Signed)
Virtual Visit via Video Note  I connected with Guy Moore on 04/28/19 at 10:30 AM EDT by a video enabled telemedicine application and verified that I am speaking with the correct person using two identifiers.  Location patient: home Location provider:work or home office Persons participating in the virtual visit: patient, provider  I discussed the limitations of evaluation and management by telemedicine and the availability of in person appointments. The patient expressed understanding and agreed to proceed.   HPI: Pt with h/o Afib, Aflutter, tachycardia s/p ablation x 5, pacemaker in place, mild emphysema, asthma, GERD, DM II, hearing loss, HLD, seasonal allergies.  Pt followed by Dr. Sarajane Jews.  Pt had pneumonia in Feb and is still recovering.  States having fatigue, rhinorrhea, HA, sneezing, nausea, intermittent hoarseness, dry cough x 2-3 days.  Notes increased fatigue with increased humidity.  Had soreness in mouth and throat 4-5 days ago that resolved.  Has good days where he can do 5 hrs of yard work at a time.  If takes a nap during the day feels less fatigue.  Denies fever, ear pain or pressure, facial pain or pressure, CP, dizziness. Taking Tylenol regularly for OA.  Followed by Cardiology, states had normal exam.  Has allergies mostly in the spring. Pt's grandson and daughter-in law (works at [employer]) had Strep around July 4th.  States does not feel like when he had pneumonia or like he has strep throat.    Pt cautious with medications as he does not want to affect his heart 2/2 h/o arrhythmias.  ROS: See pertinent positives and negatives per HPI.  Past Medical History:  Diagnosis Date  . Allergic rhinitis   . Arthritis   . Asthma   . Atrial fibrillation [facility])    sees Dr. [name] at [facility]   . Atrial flutter ([place])   . BCC (basal cell carcinoma of skin)    Nose  . COPD (chronic obstructive pulmonary disease) ([place])    sees Dr. [name]   .  Depression   . Diverticulosis   . DIVERTICULOSIS, COLON 04/13/2007       . Erosive esophagitis   . Esophageal stricture   . Gastric ulcer   . Hearing loss    uses amplification  . Hemorrhoids   . Hiatal hernia   . HTN (hypertension)   . Hyperglycemia   . 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 Other and unspecified hyperlipidemia   . Sleep apnea   . Type II or unspecified type diabetes mellitus without mention of complication, not stated as uncontrolled    Diet control     Past Surgical History:  Procedure Laterality Date  . CARDIAC ELECTROPHYSIOLOGY STUDY AND ABLATION     x 4 (sees Dr. [name] at [facility])  . NOSE SURGERY    . UVULOPALATOPHARYNGOPLASTY  1980's  . VASECTOMY  2706'C   w/complications    Family History  Problem Relation Age of Onset  . Stroke Mother   . Breast cancer Mother   . Heart disease Father   . Hypertension Father   . Cancer Father        Bladder  . Breast cancer Brother   . Atrial fibrillation Brother   . Tremor Brother   . Stroke Brother   . Colon cancer Neg Hx     Current Outpatient Medications:  .  acetaminophen (TYLENOL) 500 MG tablet, Take 500-1,000 mg by mouth every 6 (six) hours as  needed (for pain)., Disp: , Rfl:  .  albuterol (PROVENTIL HFA;VENTOLIN HFA) 108 (90 Base) MCG/ACT inhaler, Inhale 2 puffs into the lungs every 6 (six) hours as needed for wheezing or shortness of breath., Disp: 3 Inhaler, Rfl: 3 .  albuterol (PROVENTIL) (2.5 MG/3ML) 0.083% nebulizer solution, Take 3 mLs (2.5 mg total) by nebulization every 4 (four) hours as needed for wheezing or shortness of breath., Disp: 75 mL, Rfl: 5 .  amLODipine (NORVASC) 5 MG tablet, Take 1 tablet (5 mg total) by mouth daily. (Patient taking differently: Take 5 mg by mouth every evening. ), Disp: 90 tablet, Rfl: 3 .  FLOVENT DISKUS 50 MCG/BLIST diskus inhaler, USE 1 INHALATION TWICE A DAY, Disp: 60 each, Rfl: 1 .  KLOR-CON M20 20 MEQ tablet, TAKE 1 TABLET  DAILY (Patient taking differently: Take 20 mEq by mouth daily. ), Disp: 90 tablet, Rfl: 3 .  MAGNESIUM GLUCONATE PO, Take 1 tablet by mouth daily. , Disp: , Rfl:  .  Methylcellulose, Laxative, (CITRUCEL PO), Take 1 tablet by mouth daily after breakfast. , Disp: , Rfl:  .  Multiple Vitamins-Minerals (PRESERVISION AREDS 2) CAPS, Take 1 capsule by mouth 2 (two) times daily., Disp: , Rfl:  .  omeprazole (PRILOSEC) 40 MG capsule, TAKE 1 CAPSULE DAILY (Patient taking differently: Take 40 mg by mouth daily as needed (for reflux symptoms). ), Disp: 90 capsule, Rfl: 3 .  pravastatin (PRAVACHOL) 40 MG tablet, Take 1 tablet (40 mg total) by mouth at bedtime., Disp: 90 tablet, Rfl: 3 .  valsartan (DIOVAN) 160 MG tablet, Take 1 tablet (160 mg total) by mouth daily. (Patient taking differently: Take 160 mg by mouth every evening. ), Disp: 90 tablet, Rfl: 3 .  XARELTO 20 MG TABS tablet, TAKE 1 TABLET DAILY (Patient taking differently: Take 20 mg by mouth every evening. ), Disp: 90 tablet, Rfl: 3  EXAM:  VITALS per patient if applicable:  RR between 12-20 bpm, pO2 96%, BP 129/79  GENERAL: alert, oriented, appears well and in no acute distress  HEENT: atraumatic, conjunctiva clear, no obvious abnormalities on inspection of external nose and ears  NECK: normal movements of the head and neck  LUNGS: on inspection no signs of respiratory distress, breathing rate appears normal, no obvious gross SOB, gasping or wheezing  CV: no obvious cyanosis  MS: moves all visible extremities without noticeable abnormality  PSYCH/NEURO: pleasant and cooperative, no obvious depression or anxiety, speech and thought processing grossly intact  ASSESSMENT AND PLAN:  Discussed the following assessment and plan:  Fatigue, unspecified type  -discussed possible causes including deconditioning 2/2 pneumonia earlier this yr, viral illness, medications, or chronic health conditions -Given h/o OSA on CPAP, settings may need to be  adjusted as could be contributing to fatigue.  Reassuring that O2 stats are at pt's baseline. -discussed f/u and obtaining labs (CBC, Vit D, BMP, EKG) next wk for continued symptoms.  - Plan: Novel Coronavirus, NAA ([facility])  [place]  Cough  -supportive care -continue inhalers prn - Plan: Novel Coronavirus, NAA ([facility])  [place]  Rhinorrhea -supportive care -discussed using allergy medication - Plan: Novel Coronavirus, NAA ([facility])  [place] testing [place]  Post-nasal drainage -discussed possible causes allergies vs viral. -will start pt's regular allergy medication to see if symptoms improve.   -also consider saline nasal rinse.  F/u prn for continued symptoms   I discussed the assessment and treatment plan with the patient. The patient was provided  an opportunity to ask questions and all were answered. The patient agreed with the plan and demonstrated an understanding of the instructions.   The patient was advised to call back or seek an in-person evaluation if the symptoms worsen or if the condition fails to improve as anticipated.   Billie Ruddy, MD

## 2019-05-03 LAB — NOVEL CORONAVIRUS, NAA: SARS-CoV-2, NAA: NOT DETECTED

## 2019-05-06 ENCOUNTER — Other Ambulatory Visit: Payer: Self-pay | Admitting: Family Medicine

## 2019-06-01 ENCOUNTER — Telehealth: Payer: Self-pay | Admitting: Pulmonary Disease

## 2019-06-01 MED ORDER — FLOVENT DISKUS 50 MCG/BLIST IN AEPB: INHALATION_SPRAY | RESPIRATORY_TRACT | 0 refills | Status: DC

## 2019-06-01 NOTE — Telephone Encounter (Signed)
Called patient he is requesting 90 day supply be sent b/c it cheaper to get 3 months supply. 3 mos supply submitted with no refills. Pt notified he would need an appt before future refills. Nothing further needed.

## 2019-06-06 DIAGNOSIS — H6123 Impacted cerumen, bilateral: Secondary | ICD-10-CM | POA: Diagnosis not present

## 2019-06-21 ENCOUNTER — Other Ambulatory Visit: Payer: Self-pay

## 2019-06-21 ENCOUNTER — Ambulatory Visit (INDEPENDENT_AMBULATORY_CARE_PROVIDER_SITE_OTHER): Payer: Medicare Other | Admitting: Pulmonary Disease

## 2019-06-21 ENCOUNTER — Encounter: Payer: Self-pay | Admitting: Pulmonary Disease

## 2019-06-21 VITALS — BP 128/70 | HR 77 | Temp 97.3°F | Ht 70.0 in | Wt 253.8 lb

## 2019-06-21 DIAGNOSIS — J432 Centrilobular emphysema: Secondary | ICD-10-CM | POA: Diagnosis not present

## 2019-06-21 DIAGNOSIS — J449 Chronic obstructive pulmonary disease, unspecified: Secondary | ICD-10-CM

## 2019-06-21 DIAGNOSIS — E669 Obesity, unspecified: Secondary | ICD-10-CM | POA: Diagnosis not present

## 2019-06-21 DIAGNOSIS — J4489 Other specified chronic obstructive pulmonary disease: Secondary | ICD-10-CM

## 2019-06-21 DIAGNOSIS — Z23 Encounter for immunization: Secondary | ICD-10-CM

## 2019-06-21 DIAGNOSIS — Z8701 Personal history of pneumonia (recurrent): Secondary | ICD-10-CM

## 2019-06-21 DIAGNOSIS — G4733 Obstructive sleep apnea (adult) (pediatric): Secondary | ICD-10-CM | POA: Diagnosis not present

## 2019-06-21 DIAGNOSIS — G4730 Sleep apnea, unspecified: Secondary | ICD-10-CM

## 2019-06-21 NOTE — Patient Instructions (Signed)
High dose flu shot today  Follow up in 1 year 

## 2019-06-21 NOTE — Progress Notes (Signed)
[facility]  Chief Complaint  Patient presents with  . Follow-up    increased sob this summer, more fatigue, using cpap, DME [employer],denies problems with cpap    Constitutional:  BP 128/70 (BP Location: Left Arm, Cuff Size: Normal)   Pulse 77   Temp (!) 97.3 F (36.3 C)   Ht 5\' 10"  (1.778 m)   Wt 253 lb 12.8 oz (115.1 kg)   SpO2 99%   BMI 36.42 kg/m   Past Medical History:  DM, HTN, HLD, HH, Gastric ulcer, Esophageal stricture, GERD, Diverticulosis, Depression, A fib/flutter, OA, Allergies  Brief Summary:  Guy Moore is a 85 y.o. 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  He was treated for pneumonia in January.  Still feels fatigued.  Not having much cough.  Not having sputum, fever, chest pain, or hemoptysis.  Tries to stay active, but difficult during pandemic since he is limited in going out.  Doing well with CPAP.  No issues with mask fit.  Denies sinus congestion, sore throat, dry mouth, or aerophagia.   Physical Exam:   Appearance - well kempt   ENMT - clear nasal mucosa, midline nasal  septum, no oral exudates, no LAN, trachea midline  Respiratory - normal chest wall, normal respiratory effort, no accessory muscle use, no wheeze/rales  CV - s1s2 regular rate and rhythm, no murmurs, no peripheral edema, radial pulses symmetric  GI - soft, non tender, no masses  Lymph - no adenopathy noted in neck and axillary areas  MSK - normal gait  Ext - no cyanosis, clubbing, or joint inflammation noted  Skin - no rashes, lesions, or ulcers  Neuro - normal strength, oriented x 3  Psych - normal mood and affect  Assessment/Plan:   COPD with asthma and emphysema. - continue flovent and prn albuterol - high dose influenza vaccine today  Obstructive sleep apnea. - he is compliant with CPAP and reports benefit from therapy - continue auto CPAP  History of pneumonia. - CT  chest from February 2020 (reviewed by me) did not show any evidence for residual effects of pneumonia - explained that it is not uncommon to have residual symptoms of fatigue for months after recovering from pneumonia - encouraged him to maintain a gradual exercise program to improve his stamina  Obesity. - discussed importance of weight loss   Patient Instructions  High dose flu shot today  Follow up in 1 year    [name], MD [place] Pager: [phone] 06/21/2019, 11:43 AM  Flow Sheet     Pulmonary tests:  FeNO 08/04/16 >> 50 PFT 12/01/16 >> FEV1 1.93 (68%), FEV1% 61, TLC 6.59 (93%), DLCO 58%, +BD  Chest imaging:  CT angio chest 12/23/07 >> mild emphysema, scarring RML CT angio chest 11/21/18 >> mild centrilobular emphysema, mild mosaic perfusion  Sleep tests:  PSG 07/24/09 >> AHI 58, SpO2 low 74% Auto CPAP 05/21/19 to 06/19/19 >> used on 30 of 30 nights with average 9 hrs 39 min.  Average AHI 1.9 with median CPAP 8 and 95 th percentile CPAP 13 cm H2O.  Medications:   Allergies as of 06/21/2019   No Known Allergies     Medication List       Accurate as of June 21, 2019 11:43 AM. If you have any questions, ask your nurse or doctor.        acetaminophen 500 MG tablet Commonly known as: TYLENOL Take 500-1,000  mg by mouth every 6 (six) hours as needed (for pain).   albuterol 108 (90 Base) MCG/ACT inhaler Commonly known as: VENTOLIN HFA Inhale 2 puffs into the lungs every 6 (six) hours as needed for wheezing or shortness of breath.   albuterol (2.5 MG/3ML) 0.083% nebulizer solution Commonly known as: PROVENTIL Take 3 mLs (2.5 mg total) by nebulization every 4 (four) hours as needed for wheezing or shortness of breath.   amLODipine 5 MG tablet Commonly known as: NORVASC Take 1 tablet (5 mg total) by mouth daily. What changed: when to take this   CITRUCEL PO Take 1 tablet by mouth daily after breakfast.   Flovent Diskus 50  MCG/BLIST diskus inhaler Generic drug: fluticasone USE 1 INHALATION TWICE A DAY   Klor-Con M20 20 MEQ tablet Generic drug: potassium chloride SA TAKE 1 TABLET DAILY What changed: how much to take   MAGNESIUM GLUCONATE PO Take 1 tablet by mouth daily.   omeprazole 40 MG capsule Commonly known as: PRILOSEC TAKE 1 CAPSULE DAILY What changed:   when to take this  reasons to take this   pravastatin 40 MG tablet Commonly known as: PRAVACHOL Take 1 tablet (40 mg total) by mouth at bedtime.   PreserVision AREDS 2 Caps Take 1 capsule by mouth 2 (two) times daily.   valsartan 160 MG tablet Commonly known as: DIOVAN Take 1 tablet (160 mg total) by mouth daily. What changed: when to take this   Xarelto 20 MG Tabs tablet Generic drug: rivaroxaban TAKE 1 TABLET DAILY       Past Surgical History:  He  has a past surgical history that includes Uvulopalatopharyngoplasty (1980's); Vasectomy (1970's); Cardiac electrophysiology study and ablation; and Nose surgery.  Family History:  His family history includes Atrial fibrillation in his brother; Breast cancer in his brother and mother; Cancer in his father; Heart disease in his father; Hypertension in his father; Stroke in his brother and mother; Tremor in his brother.  Social History:  He  reports that he quit smoking about 41 years ago. His smoking use included cigarettes. He has a 28.00 pack-year smoking history. He has never used smokeless tobacco. He reports that he does not drink alcohol or use drugs.

## 2019-06-23 ENCOUNTER — Encounter: Payer: Self-pay | Admitting: Pulmonary Disease

## 2019-06-26 ENCOUNTER — Other Ambulatory Visit: Payer: Self-pay | Admitting: Family Medicine

## 2019-07-28 ENCOUNTER — Other Ambulatory Visit: Payer: Self-pay | Admitting: Family Medicine

## 2019-09-01 ENCOUNTER — Encounter (INDEPENDENT_AMBULATORY_CARE_PROVIDER_SITE_OTHER): Payer: Medicare Other | Admitting: Ophthalmology

## 2019-09-01 DIAGNOSIS — H35033 Hypertensive retinopathy, bilateral: Secondary | ICD-10-CM | POA: Diagnosis not present

## 2019-09-01 DIAGNOSIS — I10 Essential (primary) hypertension: Secondary | ICD-10-CM

## 2019-09-01 DIAGNOSIS — H353132 Nonexudative age-related macular degeneration, bilateral, intermediate dry stage: Secondary | ICD-10-CM | POA: Diagnosis not present

## 2019-09-01 DIAGNOSIS — H43813 Vitreous degeneration, bilateral: Secondary | ICD-10-CM | POA: Diagnosis not present

## 2019-09-01 DIAGNOSIS — D3131 Benign neoplasm of right choroid: Secondary | ICD-10-CM | POA: Diagnosis not present

## 2019-09-06 ENCOUNTER — Other Ambulatory Visit: Payer: Self-pay

## 2019-09-14 ENCOUNTER — Other Ambulatory Visit: Payer: Self-pay | Admitting: Pulmonary Disease

## 2019-11-03 ENCOUNTER — Other Ambulatory Visit: Payer: Self-pay | Admitting: Family Medicine

## 2019-11-06 ENCOUNTER — Telehealth: Payer: Self-pay | Admitting: Family Medicine

## 2019-11-06 NOTE — Telephone Encounter (Signed)
Last filled 09/19/18 Last OV 04/28/2019 [facility])  Should patient continue this medication.

## 2019-11-06 NOTE — Telephone Encounter (Signed)
Patient needs a 90 day refill with extra refills available on [place].    Pharmacy- [facility]

## 2019-11-06 NOTE — Telephone Encounter (Signed)
Last filled 09/19/2018 Last OV 12/16/2018  Ok to fill?

## 2019-11-07 MED ORDER — VALSARTAN 160 MG PO TABS: 160.0000 mg | ORAL_TABLET | Freq: Every day | ORAL | 3 refills | Status: DC

## 2019-11-07 NOTE — Telephone Encounter (Signed)
I sent it in 

## 2019-11-07 NOTE — Telephone Encounter (Signed)
Patient is aware 

## 2019-11-19 ENCOUNTER — Ambulatory Visit: Payer: Medicare Other | Attending: Internal Medicine

## 2019-11-19 DIAGNOSIS — Z23 Encounter for immunization: Secondary | ICD-10-CM

## 2019-11-19 NOTE — Progress Notes (Signed)
   Covid-19 Vaccination Clinic  Name:  Guy Moore    MRN: [id number] DOB: 23-Feb-1934  11/19/2019  [name] was observed post Covid-19 immunization for 15 minutes without incidence. He was provided with Vaccine Information Sheet and instruction to access the V-Safe system.   [name] was instructed to call 911 with any severe reactions post vaccine: Marland Kitchen Difficulty breathing  . Swelling of your face and throat  . A fast heartbeat  . A bad rash all over your body  . Dizziness and weakness    Immunizations Administered    Name Date Dose VIS Date Route   Pfizer COVID-19 Vaccine 11/19/2019  3:35 PM 0.3 mL 09/22/2019 Intramuscular   Manufacturer: [place]   Lot: [id number]   [place]: [id number]    .[email or web address]

## 2019-11-20 MED ORDER — ALBUTEROL SULFATE HFA 108 (90 BASE) MCG/ACT IN AERS: 2.0000 | INHALATION_SPRAY | Freq: Four times a day (QID) | RESPIRATORY_TRACT | 1 refills | Status: DC | PRN

## 2019-12-05 ENCOUNTER — Ambulatory Visit: Payer: Medicare Other

## 2019-12-13 ENCOUNTER — Ambulatory Visit: Payer: Medicare Other | Attending: Internal Medicine

## 2019-12-13 ENCOUNTER — Encounter: Payer: Self-pay | Admitting: Family Medicine

## 2019-12-13 ENCOUNTER — Ambulatory Visit: Payer: Medicare Other

## 2019-12-13 DIAGNOSIS — Z23 Encounter for immunization: Secondary | ICD-10-CM

## 2019-12-13 NOTE — Progress Notes (Signed)
   Covid-19 Vaccination Clinic  Name:  Guy Moore    MRN: [id number] DOB: January 28, 1934  12/13/2019  [name] was observed post Covid-19 immunization for 15 minutes and during that 15 minutes at 0915a reported to [name], RN that he was feeling dizzy/lightheaded at [phone]. Bp was 134/81, RR 18, p 75, t 96 oral and 02 sat 96 on RA. Patient states he is tired as well and has history of COPD, sometimes wearing mask can increase SOB. Patient given water and offered crackers, which declined. Removed coat and hat and continued to monitor with wife at side. T 0934 BP was 135/64, O2 sat 95 on RA, RR 20, t 96.5 continue to monitor for another 10 minutes. At 0934a, pt states still feeling lightheaded but slightly better. Had patient stand and stated he was dizzy, EMS at side and assessing at 0934. EMS continued to monitor patient and at [place], pt stated he was "much better" and EMS released. Walked short distance without any dizziness or lightheadness. Encouraged wife and [name] to notify PCP of events and if symptoms return.  He was provided with Vaccine Information Sheet and instruction to access the V-Safe system.   [name] was instructed to call 911 with any severe reactions post vaccine: Marland Kitchen Difficulty breathing  . Swelling of face and throat  . A fast heartbeat  . A bad rash all over body  . Dizziness and weakness   Immunizations Administered    Name Date Dose VIS Date Route   Pfizer COVID-19 Vaccine 12/13/2019  8:56 AM 0.3 mL 09/22/2019 Intramuscular   Manufacturer: [place]   Lot: [id number]   [place]: [id number]

## 2019-12-21 DIAGNOSIS — H6123 Impacted cerumen, bilateral: Secondary | ICD-10-CM | POA: Diagnosis not present

## 2020-02-13 ENCOUNTER — Other Ambulatory Visit: Payer: Self-pay

## 2020-02-14 ENCOUNTER — Ambulatory Visit (INDEPENDENT_AMBULATORY_CARE_PROVIDER_SITE_OTHER): Payer: Medicare Other | Admitting: Family Medicine

## 2020-02-14 ENCOUNTER — Encounter: Payer: Self-pay | Admitting: Family Medicine

## 2020-02-14 VITALS — BP 128/76 | HR 85 | Temp 97.7°F | Wt 251.2 lb

## 2020-02-14 DIAGNOSIS — G8929 Other chronic pain: Secondary | ICD-10-CM | POA: Diagnosis not present

## 2020-02-14 DIAGNOSIS — M25511 Pain in right shoulder: Secondary | ICD-10-CM | POA: Diagnosis not present

## 2020-02-14 DIAGNOSIS — L989 Disorder of the skin and subcutaneous tissue, unspecified: Secondary | ICD-10-CM

## 2020-02-14 NOTE — Progress Notes (Signed)
   Subjective:    Patient ID: Guy Moore, male    DOB: [birth year], 86 y.o.   MRN: [id number]  HPI Here for 2 issues. First about 4 weeks ago while reaching for something in his kitchen he felt a tearing sensation and a sudden sharp pain in the right shoulder. He has had pain ever since, especially when raising the arm above his head. He uses Tyelnol with minimal relief. Also several months ago a lesion came up on his scalp that has persisted.    Review of Systems  Constitutional: Negative.   Respiratory: Negative.   Cardiovascular: Negative.   Musculoskeletal: Positive for arthralgias.       Objective:   Physical Exam Constitutional:      Appearance: Normal appearance.  Cardiovascular:     Rate and Rhythm: Normal rate and regular rhythm.     Pulses: Normal pulses.     Heart sounds: Normal heart sounds.  Pulmonary:     Effort: Pulmonary effort is normal.     Breath sounds: Normal breath sounds.  Skin:    Comments: The anterior scalp has a raised lesion about 3 mm in diameter   Neurological:     Mental Status: He is alert.           Assessment & Plan:  For the shoulder pain, refer to Orthopedics. For the skin lesion, refer to Dermatology. Alysia Penna, MD

## 2020-02-15 DIAGNOSIS — M542 Cervicalgia: Secondary | ICD-10-CM | POA: Diagnosis not present

## 2020-02-15 DIAGNOSIS — M25511 Pain in right shoulder: Secondary | ICD-10-CM | POA: Diagnosis not present

## 2020-02-17 ENCOUNTER — Encounter (HOSPITAL_COMMUNITY): Payer: Self-pay

## 2020-02-17 ENCOUNTER — Other Ambulatory Visit: Payer: Self-pay

## 2020-02-17 ENCOUNTER — Ambulatory Visit (HOSPITAL_COMMUNITY)
Admission: EM | Admit: 2020-02-17 | Discharge: 2020-02-17 | Disposition: A | Discharge status: Home / Self Care | Payer: Medicare Other | Attending: Physician Assistant | Admitting: Physician Assistant

## 2020-02-17 DIAGNOSIS — T162XXA Foreign body in left ear, initial encounter: Secondary | ICD-10-CM | POA: Diagnosis not present

## 2020-02-17 NOTE — ED Provider Notes (Signed)
[facility]    CSN: [id number] Arrival date & time: 02/17/20  1005      History   Chief Complaint Chief Complaint  Patient presents with   Foreign Body in [place]    HPI Guy Moore is a 86 y.o. male.   Patient presents for concern of having hearing aid ear attachment stuck in his ear. He reports last night he put his hearing aid in and felt like it was not all the way in and took this out at dinner. Reports going home and noticing that the rubber attachment that goes inside the ear was not on hearing aid and was not in the pocket that he placed it in. Reports attempting to place another one on his hearing aid and put in his ear and was unable to as though something in the way. Denies pain today. He notified his audiologist recommended him come to urgent care.     Past Medical History:  Diagnosis Date   Allergic rhinitis    Arthritis    Asthma    Atrial fibrillation [facility])    sees Dr. [name] at [facility]iology    Atrial flutter ([place])    BCC (basal cell carcinoma of skin)    Nose   COPD with emphysema ([place])    sees Dr. 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   Sleep apnea     Patient Active Problem List   Diagnosis Date Noted   COPD with asthma ([place]) 03/08/2018   Asthma 08/26/2016   Hyperglycemia 05/29/2016   Thoracic scoliosis 12/16/2015   Thrombocytopenia ([place]) 04/12/2015   History of basal cell cancer 06/27/2014   Bleeding gums 04/15/2013   GERD (gastroesophageal reflux disease) 08/31/2011   Anticoagulant long-term use 08/31/2011   OBSTRUCTIVE SLEEP APNEA 07/04/2009   Atrial fibrillation ([place]) 04/19/2009   ALLERGIC  RHINITIS 04/14/2007   Hyperlipidemia 04/13/2007   Essential hypertension 04/13/2007    Past Surgical History:  Procedure Laterality Date   CARDIAC ELECTROPHYSIOLOGY STUDY AND ABLATION     x 4 (sees Dr. [name] at [facility])   [place]  1980's   VASECTOMY  [id number]   w/complications       Home Medications    Prior to Admission medications   Medication Sig Start Date End Date Taking? Authorizing Provider  acetaminophen (TYLENOL) 500 MG tablet Take 500-1,000 mg by mouth every 6 (six) hours as needed (for pain).    [provider]  albuterol (PROVENTIL) (2.5 MG/3ML) 0.083% nebulizer solution Take 3 mLs (2.5 mg total) by nebulization every 4 (four) hours as needed for wheezing or shortness of breath. 11/15/18   Laurey Morale, MD  albuterol (VENTOLIN HFA) 108 (90 Base) MCG/ACT inhaler Inhale 2 puffs into the lungs every 6 (six) hours as needed for wheezing or shortness of breath. 11/20/19   [name], MD  amLODipine (NORVASC) 5 MG tablet TAKE 1 TABLET DAILY 07/28/19   Laurey Morale, MD  fluticasone (FLOVENT DISKUS) 50 MCG/BLIST diskus inhaler USE 1 INHALATION TWICE A DAY (NEED APPOINTMENT  FOR REFILLS) 09/15/19   [name], MD  MAGNESIUM GLUCONATE PO Take 1 tablet by mouth daily.     [provider]  Methylcellulose, Laxative, (CITRUCEL PO) Take 1 tablet by mouth daily after breakfast.     [provider]  Multiple Vitamins-Minerals (PRESERVISION AREDS 2) CAPS Take 1 capsule by mouth 2 (two) times daily.    [provider]  omeprazole (PRILOSEC) 40 MG capsule TAKE 1 CAPSULE DAILY 06/28/19   Laurey Morale, MD  potassium chloride SA (K-DUR) 20 MEQ tablet TAKE 1 TABLET DAILY 06/28/19   Laurey Morale, MD  pravastatin (PRAVACHOL) 40 MG tablet TAKE 1 TABLET AT BEDTIME 07/28/19   Laurey Morale, MD  valsartan (DIOVAN) 160 MG tablet Take 1 tablet (160 mg total) by mouth daily. 11/07/19   Laurey Morale, MD    XARELTO 20 MG TABS tablet TAKE 1 TABLET DAILY 05/08/19   Laurey Morale, MD    Family History Family History  Problem Relation Age of Onset   Stroke Mother    Breast cancer Mother    Heart disease Father    Hypertension Father    Cancer Father        Bladder   Breast cancer Brother    Atrial fibrillation Brother    Tremor Brother    Stroke Brother    Colon cancer Neg Hx     Social History Social History   Tobacco Use   Smoking status: Former Smoker    Packs/day: 1.00    Years: 28.00    Pack years: 28.00    Types: Cigarettes    Quit date: 10/12/1977    Years since quitting: 42.3   Smokeless tobacco: Never Used  Substance Use Topics   Alcohol use: No    Alcohol/week: 0.0 standard drinks   Drug use: No     Allergies   Patient has no known allergies.   Review of Systems Review of Systems   Physical Exam Triage Vital Signs ED Triage Vitals  Enc Vitals Group     BP 02/17/20 1019 (!) 159/86     Pulse Rate 02/17/20 1019 80     Resp 02/17/20 1019 19     Temp 02/17/20 1019 98 F (36.7 C)     Temp Source 02/17/20 1019 Oral     SpO2 02/17/20 1019 96 %     Weight --      Height --      Head Circumference --      Peak Flow --      Pain Score 02/17/20 1018 0     Pain Loc --      Pain Edu? --      Excl. in [place]? --    No data found.  Updated Vital Signs BP (!) 159/86 (BP Location: Left Arm)    Pulse 80    Temp 98 F (36.7 C) (Oral)    Resp 19    SpO2 96%   Visual Acuity Right Eye Distance:   Left Eye Distance:   Bilateral Distance:    Right Eye Near:   Left Eye Near:    Bilateral Near:     Physical Exam Vitals and nursing note reviewed.  Constitutional:      Appearance: Normal appearance.  HENT:     Right Ear: Tympanic membrane normal.     Ears:     Comments: Rubber hearing aid attachment visible in left ear canal. Successfully removed. Ear canal with minor cerumen. TM  pearly gray. No pain with manipulation of the ear. Neurological:      Mental Status: He is alert.      UC Treatments / Results  Labs (all labs ordered are listed, but only abnormal results are displayed) Labs Reviewed - No data to display  EKG   Radiology No results found.  Procedures Foreign Body Removal  Date/Time: 02/17/2020 10:32 AM Performed by: Purnell Shoemaker, PA-C Authorized by: Purnell Shoemaker, PA-C   Consent:    Consent obtained:  Verbal   Consent given by:  Patient   Risks discussed:  Pain and incomplete removal   Alternatives discussed:  Referral Location:    Location:  Ear   Ear location:  L ear (canal) Pre-procedure details:    Imaging:  None Comments:     Hearing aid attachment piece successfully removed from left ear canal.   (including critical care time)  Medications Ordered in UC Medications - No data to display  Initial Impression / Assessment and Plan / UC Course  I have reviewed the triage vital signs and the nursing notes.  Pertinent labs & imaging results that were available during my care of the patient were reviewed by me and considered in my medical decision making (see chart for details).     #Foreign object in ear Patient is a 71-year-old who presented with foreign object in left ear. Successful removal of alligator forceps. No significant irritation or erythema in the ear canal. Patient discharged with instructions to follow-up with primary care audiologist for any further complaints. Verbalized understanding. Final Clinical Impressions(s) / UC Diagnoses   Final diagnoses:  Foreign body of left ear, initial encounter     Discharge Instructions     We successfully removed the hearing aid attachment. Follow up with your primary care and audiologist for any further issues      ED Prescriptions    None     PDMP not reviewed this encounter.   Purnell Shoemaker, PA-C 02/17/20 [phone]

## 2020-02-17 NOTE — ED Triage Notes (Signed)
Pt reports he is feeling a dull sensation in his left ear since last night, pt think the ear tip of his hearing aid is stuck in his left ear.

## 2020-02-17 NOTE — Discharge Instructions (Signed)
We successfully removed the hearing aid attachment. Follow up with your primary care and audiologist for any further issues

## 2020-02-19 ENCOUNTER — Other Ambulatory Visit: Payer: Self-pay

## 2020-02-20 ENCOUNTER — Encounter: Payer: Self-pay | Admitting: Family Medicine

## 2020-02-20 ENCOUNTER — Other Ambulatory Visit: Payer: Medicare Other

## 2020-02-20 ENCOUNTER — Ambulatory Visit (INDEPENDENT_AMBULATORY_CARE_PROVIDER_SITE_OTHER): Payer: Medicare Other | Admitting: Family Medicine

## 2020-02-20 VITALS — BP 140/80 | HR 90 | Temp 97.5°F | Wt 249.2 lb

## 2020-02-20 DIAGNOSIS — K219 Gastro-esophageal reflux disease without esophagitis: Secondary | ICD-10-CM | POA: Diagnosis not present

## 2020-02-20 DIAGNOSIS — J449 Chronic obstructive pulmonary disease, unspecified: Secondary | ICD-10-CM

## 2020-02-20 DIAGNOSIS — I10 Essential (primary) hypertension: Secondary | ICD-10-CM

## 2020-02-20 DIAGNOSIS — N401 Enlarged prostate with lower urinary tract symptoms: Secondary | ICD-10-CM | POA: Diagnosis not present

## 2020-02-20 DIAGNOSIS — D485 Neoplasm of uncertain behavior of skin: Secondary | ICD-10-CM | POA: Diagnosis not present

## 2020-02-20 DIAGNOSIS — D696 Thrombocytopenia, unspecified: Secondary | ICD-10-CM

## 2020-02-20 DIAGNOSIS — E785 Hyperlipidemia, unspecified: Secondary | ICD-10-CM | POA: Diagnosis not present

## 2020-02-20 DIAGNOSIS — I480 Paroxysmal atrial fibrillation: Secondary | ICD-10-CM

## 2020-02-20 DIAGNOSIS — D1801 Hemangioma of skin and subcutaneous tissue: Secondary | ICD-10-CM | POA: Diagnosis not present

## 2020-02-20 DIAGNOSIS — L570 Actinic keratosis: Secondary | ICD-10-CM | POA: Diagnosis not present

## 2020-02-20 DIAGNOSIS — N138 Other obstructive and reflux uropathy: Secondary | ICD-10-CM | POA: Diagnosis not present

## 2020-02-20 DIAGNOSIS — D72819 Decreased white blood cell count, unspecified: Secondary | ICD-10-CM

## 2020-02-20 DIAGNOSIS — L819 Disorder of pigmentation, unspecified: Secondary | ICD-10-CM | POA: Diagnosis not present

## 2020-02-20 DIAGNOSIS — R739 Hyperglycemia, unspecified: Secondary | ICD-10-CM

## 2020-02-20 DIAGNOSIS — L821 Other seborrheic keratosis: Secondary | ICD-10-CM | POA: Diagnosis not present

## 2020-02-20 LAB — CBC WITH DIFFERENTIAL/PLATELET
Absolute Monocytes: 279 cells/uL (ref 200–950)
Basophils Absolute: 50 cells/uL (ref 0–200)
Basophils Relative: 1.6 %
Eosinophils Absolute: 109 cells/uL (ref 15–500)
Eosinophils Relative: 3.5 %
HCT: 43.5 % (ref 38.5–50.0)
Hemoglobin: 14.3 g/dL (ref 13.2–17.1)
Lymphs Abs: 446 cells/uL — ABNORMAL LOW (ref 850–3900)
MCH: 29.3 pg (ref 27.0–33.0)
MCHC: 32.9 g/dL (ref 32.0–36.0)
MCV: 89.1 fL (ref 80.0–100.0)
MPV: 10.7 fL (ref 7.5–12.5)
Monocytes Relative: 9 %
Neutro Abs: 2217 cells/uL (ref 1500–7800)
Neutrophils Relative %: 71.5 %
Platelets: 126 10*3/uL — ABNORMAL LOW (ref 140–400)
RBC: 4.88 10*6/uL (ref 4.20–5.80)
RDW: 13.9 % (ref 11.0–15.0)
Total Lymphocyte: 14.4 %
WBC: 3.1 10*3/uL — ABNORMAL LOW (ref 3.8–10.8)

## 2020-02-20 LAB — HEPATIC FUNCTION PANEL
ALT: 14 U/L (ref 0–53)
AST: 16 U/L (ref 0–37)
Albumin: 4.3 g/dL (ref 3.5–5.2)
Alkaline Phosphatase: 40 U/L (ref 39–117)
Bilirubin, Direct: 0.2 mg/dL (ref 0.0–0.3)
Total Bilirubin: 0.8 mg/dL (ref 0.2–1.2)
Total Protein: 6.3 g/dL (ref 6.0–8.3)

## 2020-02-20 LAB — BASIC METABOLIC PANEL
BUN: 20 mg/dL (ref 6–23)
CO2: 30 mEq/L (ref 19–32)
Calcium: 8.8 mg/dL (ref 8.4–10.5)
Chloride: 103 mEq/L (ref 96–112)
Creatinine, Ser: 0.94 mg/dL (ref 0.40–1.50)
GFR: 76.41 mL/min (ref >60.00)
Glucose, Bld: 124 mg/dL — ABNORMAL HIGH (ref 70–99)
Potassium: 4.2 mEq/L (ref 3.5–5.1)
Sodium: 139 mEq/L (ref 135–145)

## 2020-02-20 LAB — LIPID PANEL
Cholesterol: 124 mg/dL (ref 0–200)
HDL: 51.1 mg/dL (ref >39.00)
LDL Cholesterol: 66 mg/dL (ref 0–99)
NonHDL: 72.48
Total CHOL/HDL Ratio: 2
Triglycerides: 31 mg/dL (ref 0.0–149.0)
VLDL: 6.2 mg/dL (ref 0.0–40.0)

## 2020-02-20 LAB — PSA: PSA: 1.77 ng/mL (ref 0.10–4.00)

## 2020-02-20 LAB — HEMOGLOBIN A1C: Hgb A1c MFr Bld: 6.3 % (ref 4.6–6.5)

## 2020-02-20 LAB — TSH: TSH: 1.56 u[IU]/mL (ref 0.35–4.50)

## 2020-02-20 NOTE — Progress Notes (Signed)
Subjective:    Patient ID: Guy Moore, male    DOB: 03/23/1934, 86 y.o.   MRN: [id number]  HPI Here to follow up on issues. He feels well in general. He recently saw Dr. Griffin Basil for pain in the right shoulder. He gave him a steroid shot and has set him up for PT. Sonia Side will see [facility] this afternoon for the scalp lesion. His BP is stable at home.    Review of Systems  Constitutional: Negative.   HENT: Negative.   Eyes: Negative.   Respiratory: Negative.   Cardiovascular: Negative.   Gastrointestinal: Negative.   Genitourinary: Negative.   Musculoskeletal: Negative.   Skin: Negative.   Neurological: Negative.   Psychiatric/Behavioral: Negative.        Objective:   Physical Exam Constitutional:      General: He is not in acute distress.    Appearance: He is well-developed. He is not diaphoretic.  HENT:     Head: Normocephalic and atraumatic.     Right Ear: External ear normal.     Left Ear: External ear normal.     Nose: Nose normal.     Mouth/Throat:     Pharynx: No oropharyngeal exudate.  Eyes:     General: No scleral icterus.       Right eye: No discharge.        Left eye: No discharge.     Conjunctiva/sclera: Conjunctivae normal.     Pupils: Pupils are equal, round, and reactive to light.  Neck:     Thyroid: No thyromegaly.     Vascular: No JVD.     Trachea: No tracheal deviation.  Cardiovascular:     Rate and Rhythm: Normal rate and regular rhythm.     Heart sounds: Normal heart sounds. No murmur. No friction rub. No gallop.   Pulmonary:     Effort: Pulmonary effort is normal. No respiratory distress.     Breath sounds: Normal breath sounds. No wheezing or rales.  Chest:     Chest wall: No tenderness.  Abdominal:     General: Bowel sounds are normal. There is no distension.     Palpations: Abdomen is soft. There is no mass.     Tenderness: There is no abdominal tenderness. There is no guarding or rebound.  Genitourinary:    Penis: Normal.  No tenderness.      Testes: Normal.     Prostate: Normal.     Rectum: Normal. Guaiac result negative.  Musculoskeletal:        General: No tenderness. Normal range of motion.     Cervical back: Neck supple.  Lymphadenopathy:     Cervical: No cervical adenopathy.  Skin:    General: Skin is warm and dry.     Coloration: Skin is not pale.     Findings: No erythema or rash.  Neurological:     Mental Status: He is alert and oriented to person, place, and time.     Cranial Nerves: No cranial nerve deficit.     Motor: No abnormal muscle tone.     Coordination: Coordination normal.     Deep Tendon Reflexes: Reflexes are normal and symmetric. Reflexes normal.  Psychiatric:        Behavior: Behavior normal.        Thought Content: Thought content normal.        Judgment: Judgment normal.           Assessment & Plan:  He seems to be  doing well. His BP and PAF are stable. GERD and COPD are stable. Get fasting labs to check lipids, A1c, etc.  Alysia Penna, MD

## 2020-02-20 NOTE — Telephone Encounter (Signed)
I have no idea. He got his physical labs here today. I never ordered anything at [facility]

## 2020-02-21 ENCOUNTER — Other Ambulatory Visit: Payer: Medicare Other

## 2020-02-21 DIAGNOSIS — M25511 Pain in right shoulder: Secondary | ICD-10-CM | POA: Diagnosis not present

## 2020-02-21 DIAGNOSIS — M25611 Stiffness of right shoulder, not elsewhere classified: Secondary | ICD-10-CM | POA: Diagnosis not present

## 2020-02-21 DIAGNOSIS — M6281 Muscle weakness (generalized): Secondary | ICD-10-CM | POA: Diagnosis not present

## 2020-02-21 DIAGNOSIS — S46011D Strain of muscle(s) and tendon(s) of the rotator cuff of right shoulder, subsequent encounter: Secondary | ICD-10-CM | POA: Diagnosis not present

## 2020-02-21 LAB — CBC WITH DIFFERENTIAL/PLATELET
Basophils Absolute: 0 10*3/uL (ref 0.0–0.1)
Basophils Relative: 0.8 % (ref 0.0–3.0)
Eosinophils Absolute: 0.1 10*3/uL (ref 0.0–0.7)
Eosinophils Relative: 3.4 % (ref 0.0–5.0)
HCT: 41.5 % (ref 39.0–52.0)
Hemoglobin: 14 g/dL (ref 13.0–17.0)
Lymphocytes Relative: 14.1 % (ref 12.0–46.0)
Lymphs Abs: 0.4 10*3/uL — ABNORMAL LOW (ref 0.7–4.0)
MCHC: 33.6 g/dL (ref 30.0–36.0)
MCV: 89.7 fl (ref 78.0–100.0)
Monocytes Absolute: 0.2 10*3/uL (ref 0.1–1.0)
Monocytes Relative: 7 % (ref 3.0–12.0)
Neutro Abs: 2.3 10*3/uL (ref 1.4–7.7)
Neutrophils Relative %: 74.7 % (ref 43.0–77.0)
Platelets: 113 10*3/uL — ABNORMAL LOW (ref 150.0–400.0)
RBC: 4.63 Mil/uL (ref 4.22–5.81)
RDW: 15.2 % (ref 11.5–15.5)
WBC: 3.1 10*3/uL — ABNORMAL LOW (ref 4.0–10.5)

## 2020-02-21 NOTE — Addendum Note (Signed)
Addended by: Alysia Penna A on: 02/21/2020 05:27 PM   Modules accepted: Orders

## 2020-02-21 NOTE — Addendum Note (Signed)
Addended by: Elmer Picker on: 02/21/2020 10:02 AM   Modules accepted: Orders

## 2020-02-22 ENCOUNTER — Telehealth: Payer: Self-pay | Admitting: Physician Assistant

## 2020-02-22 ENCOUNTER — Encounter: Payer: Self-pay | Admitting: Family Medicine

## 2020-02-22 NOTE — Telephone Encounter (Signed)
Received a new hem referral from Dr. Sarajane Jews for thrombocytopenia and leukopenia. Mr. Rosser has been cld and scheduled to see Cassie on 5/17 at 1:30pm w/labs at 1pm. Pt aware to arrive 15 minutes early.

## 2020-02-23 NOTE — Telephone Encounter (Signed)
They will make an appt for him to be seen and they likely will order more tests

## 2020-02-23 NOTE — Telephone Encounter (Signed)
Please advise 

## 2020-02-26 ENCOUNTER — Other Ambulatory Visit: Payer: Self-pay | Admitting: Physician Assistant

## 2020-02-26 ENCOUNTER — Inpatient Hospital Stay (HOSPITAL_BASED_OUTPATIENT_CLINIC_OR_DEPARTMENT_OTHER): Payer: Medicare Other | Admitting: Physician Assistant

## 2020-02-26 ENCOUNTER — Encounter: Payer: Self-pay | Admitting: Physician Assistant

## 2020-02-26 ENCOUNTER — Other Ambulatory Visit: Payer: Self-pay

## 2020-02-26 ENCOUNTER — Inpatient Hospital Stay: Payer: Medicare Other | Attending: Physician Assistant

## 2020-02-26 VITALS — BP 148/75 | Temp 98.0°F | Ht 70.0 in | Wt 250.2 lb

## 2020-02-26 DIAGNOSIS — D696 Thrombocytopenia, unspecified: Secondary | ICD-10-CM | POA: Diagnosis not present

## 2020-02-26 DIAGNOSIS — E119 Type 2 diabetes mellitus without complications: Secondary | ICD-10-CM | POA: Insufficient documentation

## 2020-02-26 DIAGNOSIS — Z87891 Personal history of nicotine dependence: Secondary | ICD-10-CM

## 2020-02-26 DIAGNOSIS — K219 Gastro-esophageal reflux disease without esophagitis: Secondary | ICD-10-CM | POA: Insufficient documentation

## 2020-02-26 DIAGNOSIS — Z85828 Personal history of other malignant neoplasm of skin: Secondary | ICD-10-CM | POA: Diagnosis not present

## 2020-02-26 DIAGNOSIS — Z8052 Family history of malignant neoplasm of bladder: Secondary | ICD-10-CM

## 2020-02-26 DIAGNOSIS — G4733 Obstructive sleep apnea (adult) (pediatric): Secondary | ICD-10-CM | POA: Diagnosis not present

## 2020-02-26 DIAGNOSIS — J449 Chronic obstructive pulmonary disease, unspecified: Secondary | ICD-10-CM | POA: Diagnosis not present

## 2020-02-26 DIAGNOSIS — Z7901 Long term (current) use of anticoagulants: Secondary | ICD-10-CM | POA: Insufficient documentation

## 2020-02-26 DIAGNOSIS — Z8049 Family history of malignant neoplasm of other genital organs: Secondary | ICD-10-CM | POA: Insufficient documentation

## 2020-02-26 DIAGNOSIS — I10 Essential (primary) hypertension: Secondary | ICD-10-CM

## 2020-02-26 DIAGNOSIS — Z7951 Long term (current) use of inhaled steroids: Secondary | ICD-10-CM

## 2020-02-26 DIAGNOSIS — I4891 Unspecified atrial fibrillation: Secondary | ICD-10-CM | POA: Insufficient documentation

## 2020-02-26 DIAGNOSIS — Z114 Encounter for screening for human immunodeficiency virus [HIV]: Secondary | ICD-10-CM

## 2020-02-26 DIAGNOSIS — Z8249 Family history of ischemic heart disease and other diseases of the circulatory system: Secondary | ICD-10-CM

## 2020-02-26 DIAGNOSIS — D72819 Decreased white blood cell count, unspecified: Secondary | ICD-10-CM

## 2020-02-26 DIAGNOSIS — D649 Anemia, unspecified: Secondary | ICD-10-CM

## 2020-02-26 DIAGNOSIS — Z803 Family history of malignant neoplasm of breast: Secondary | ICD-10-CM

## 2020-02-26 DIAGNOSIS — R5383 Other fatigue: Secondary | ICD-10-CM

## 2020-02-26 DIAGNOSIS — Z820 Family history of epilepsy and other diseases of the nervous system: Secondary | ICD-10-CM | POA: Insufficient documentation

## 2020-02-26 DIAGNOSIS — D61818 Other pancytopenia: Secondary | ICD-10-CM

## 2020-02-26 DIAGNOSIS — E785 Hyperlipidemia, unspecified: Secondary | ICD-10-CM | POA: Diagnosis not present

## 2020-02-26 DIAGNOSIS — D539 Nutritional anemia, unspecified: Secondary | ICD-10-CM

## 2020-02-26 DIAGNOSIS — Z7289 Other problems related to lifestyle: Secondary | ICD-10-CM

## 2020-02-26 DIAGNOSIS — Z79899 Other long term (current) drug therapy: Secondary | ICD-10-CM

## 2020-02-26 DIAGNOSIS — D7589 Other specified diseases of blood and blood-forming organs: Secondary | ICD-10-CM

## 2020-02-26 DIAGNOSIS — D709 Neutropenia, unspecified: Secondary | ICD-10-CM

## 2020-02-26 LAB — CBC WITH DIFFERENTIAL (CANCER CENTER ONLY)
Abs Immature Granulocytes: 0.02 10*3/uL (ref 0.00–0.07)
Basophils Absolute: 0 10*3/uL (ref 0.0–0.1)
Basophils Relative: 1 %
Eosinophils Absolute: 0.1 10*3/uL (ref 0.0–0.5)
Eosinophils Relative: 3 %
HCT: 42.6 % (ref 39.0–52.0)
Hemoglobin: 14 g/dL (ref 13.0–17.0)
Immature Granulocytes: 1 %
Lymphocytes Relative: 13 %
Lymphs Abs: 0.5 10*3/uL — ABNORMAL LOW (ref 0.7–4.0)
MCH: 29.4 pg (ref 26.0–34.0)
MCHC: 32.9 g/dL (ref 30.0–36.0)
MCV: 89.5 fL (ref 80.0–100.0)
Monocytes Absolute: 0.4 10*3/uL (ref 0.1–1.0)
Monocytes Relative: 9 %
Neutro Abs: 3.1 10*3/uL (ref 1.7–7.7)
Neutrophils Relative %: 73 %
Platelet Count: 115 10*3/uL — ABNORMAL LOW (ref 150–400)
RBC: 4.76 MIL/uL (ref 4.22–5.81)
RDW: 14.6 % (ref 11.5–15.5)
WBC Count: 4.1 10*3/uL (ref 4.0–10.5)
nRBC: 0 % (ref 0.0–0.2)

## 2020-02-26 LAB — CMP (CANCER CENTER ONLY)
ALT: 17 U/L (ref 0–44)
AST: 19 U/L (ref 15–41)
Albumin: 3.8 g/dL (ref 3.5–5.0)
Alkaline Phosphatase: 44 U/L (ref 38–126)
Anion gap: 10 (ref 5–15)
BUN: 22 mg/dL (ref 8–23)
CO2: 25 mmol/L (ref 22–32)
Calcium: 9 mg/dL (ref 8.9–10.3)
Chloride: 106 mmol/L (ref 98–111)
Creatinine: 0.94 mg/dL (ref 0.61–1.24)
GFR, Est AFR Am: >60 mL/min (ref >60)
GFR, Estimated: >60 mL/min (ref >60)
Glucose, Bld: 103 mg/dL — ABNORMAL HIGH (ref 70–99)
Potassium: 4.1 mmol/L (ref 3.5–5.1)
Sodium: 141 mmol/L (ref 135–145)
Total Bilirubin: 0.7 mg/dL (ref 0.3–1.2)
Total Protein: 6.6 g/dL (ref 6.5–8.1)

## 2020-02-26 LAB — HEPATITIS PANEL, ACUTE
HCV Ab: NONREACTIVE
Hep A IgM: NONREACTIVE
Hep B C IgM: NONREACTIVE
Hepatitis B Surface Ag: NONREACTIVE

## 2020-02-26 LAB — VITAMIN B12: Vitamin B-12: 183 pg/mL (ref 180–914)

## 2020-02-26 LAB — FERRITIN: Ferritin: 45 ng/mL (ref 24–336)

## 2020-02-26 LAB — IRON AND TIBC
Iron: 106 ug/dL (ref 42–163)
Saturation Ratios: 30 % (ref 20–55)
TIBC: 350 ug/dL (ref 202–409)
UIBC: 244 ug/dL (ref 117–376)

## 2020-02-26 LAB — HIV ANTIBODY (ROUTINE TESTING W REFLEX): HIV Screen 4th Generation wRfx: NONREACTIVE

## 2020-02-26 LAB — FOLATE: Folate: 7.5 ng/mL (ref >5.9)

## 2020-02-26 LAB — LACTATE DEHYDROGENASE: LDH: 132 U/L (ref 98–192)

## 2020-02-26 NOTE — Progress Notes (Signed)
[place] [phone]   [phone]  CONSULT NOTE  REFERRING PHYSICIAN: Dr. Sarajane Jews MD  REASON FOR CONSULTATION:  Bicytopenia   HPI Guy Moore is a 86 y.o. male with a past medical history significant for hypertension, atrial fibrillation, obstructive sleep apnea, asthma, COPD, and GERD is referred to the clinic for evaluation of intermittent periods of cytopenias.  Per chart review, it appears that the patient has had periods of intermittent mild anemia, thrombocytopenia, and/or leukopenia for at least the last 12 years.  It appears that his platelet count has never dropped below 100 K.   His anemia never got below a hemoglobin of 11.  And his WBC count never dropped below 2.8.  Patient recently had appointment with his primary care physician on 02/20/2020 regarding his right shoulder pain.  Blood work performed at that visit noted persistent leukocytopenia with white blood cell count of 3.8 and a platelet count of 141K.  The patient was referred to our clinic today for further evaluation and recommendations regarding this finding.  Overall, the patient is feeling well today.  He states that over the past year he has noticed that he is fatigued; however his fatigue improves with a nap.  The patient has been taking statins for the last 15 years secondary to mild plaque buildup on 1 his arteries.  The patient states that he has never had an abnormal lipid panel performed.  He has a follow-up appointment with his cardiologist next month and is inquiring whether or not he should discontinue his statin.  The patient denies any recent fevers, chills, night sweats, or weight loss.  He denies any lymphadenopathy.  He denies any frequent infections or recent infections.  He denies any abnormal bleeding or bruising.  His last colonoscopy was in 2013 and it was normal.  The patient denies taking any herbal supplements.  The patient denies any use of any immunosuppressive  drugs.  The patient denies any particular viral infections such as HIV or hepatitis.  He denies any history of any autoimmune conditions.  He states that he was tested for autoimmune conditions in the past due to his leukopenia which was unremarkable.  The patient's family history consists of a mother who had breast cancer, uterine cancer, myocardial infarction, and a brain tumor.  She passed away at the age of 78.  The patient's father had bladder cancer, CVA, and Parkinson's disease.  He passed away at the age of 17.  The patient has a brother who has a history of breast cancer.  The patient denies any known bone marrow disorders.  The patient used to work in the [employer].  He also was an Optometrist.  He states that he was exposed to agent orange while in the [employer].  The patient is married and has 3 biological children and 2 children from his spouse's previous marriage.  The patient denies any drug use.  He smoked approximately 1 pack a day for 22 years and quit smoking at the age of 4.  The patient drinks scotch approximately 1 time per week.  Past Medical History:  Diagnosis Date  . Allergic rhinitis   . Arthritis   . Asthma   . Atrial fibrillation [facility])    sees Dr. [name] at [facility]   . Atrial flutter ([place])   . BCC (basal cell carcinoma of skin)    Nose  . COPD with emphysema ([place])    sees Dr. [name]   .  Depression   . Diabetes mellitus ([place])    Diet control   . Diverticulosis   . Erosive esophagitis   . Esophageal stricture   . Gastric ulcer   . Hearing loss    uses amplification  . Hemorrhoids   . Hiatal hernia   . HTN (hypertension)   . Hyperlipidemia   . 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 Sleep apnea     Past Surgical History:  Procedure Laterality Date  . CARDIAC ELECTROPHYSIOLOGY STUDY AND ABLATION     x 4 (sees Dr. [name] at [facility])  . NOSE SURGERY    . UVULOPALATOPHARYNGOPLASTY  1980's  .  VASECTOMY  [id number]   w/complications    Family History  Problem Relation Age of Onset  . Stroke Mother   . Breast cancer Mother   . Heart disease Father   . Hypertension Father   . Cancer Father        Bladder  . Breast cancer Brother   . Atrial fibrillation Brother   . Tremor Brother   . Stroke Brother   . Colon cancer Neg Hx     Social History Social History   Tobacco Use  . Smoking status: Former Smoker    Packs/day: 1.00    Years: 28.00    Pack years: 28.00    Types: Cigarettes    Quit date: 10/12/1977    Years since quitting: 42.4  . Smokeless tobacco: Never Used  Substance Use Topics  . Alcohol use: No    Alcohol/week: 0.0 standard drinks  . Drug use: No    No Known Allergies  Current Outpatient Medications  Medication Sig Dispense Refill  . acetaminophen (TYLENOL) 500 MG tablet Take 500-1,000 mg by mouth every 6 (six) hours as needed (for pain).    Marland Kitchen albuterol (PROVENTIL) (2.5 MG/3ML) 0.083% nebulizer solution Take 3 mLs (2.5 mg total) by nebulization every 4 (four) hours as needed for wheezing or shortness of breath. 75 mL 5  . albuterol (VENTOLIN HFA) 108 (90 Base) MCG/ACT inhaler Inhale 2 puffs into the lungs every 6 (six) hours as needed for wheezing or shortness of breath. 56 g 1  . amLODipine (NORVASC) 5 MG tablet TAKE 1 TABLET DAILY 90 tablet 3  . fluticasone (FLOVENT DISKUS) 50 MCG/BLIST diskus inhaler USE 1 INHALATION TWICE A DAY (NEED APPOINTMENT FOR REFILLS) 180 each 3  . MAGNESIUM GLUCONATE PO Take 1 tablet by mouth daily.     . Methylcellulose, Laxative, (CITRUCEL PO) Take 1 tablet by mouth daily after breakfast.     . Multiple Vitamins-Minerals (PRESERVISION AREDS 2) CAPS Take 1 capsule by mouth 2 (two) times daily.    Marland Kitchen omeprazole (PRILOSEC) 40 MG capsule TAKE 1 CAPSULE DAILY 90 capsule 3  . potassium chloride SA (K-DUR) 20 MEQ tablet TAKE 1 TABLET DAILY 90 tablet 3  . pravastatin (PRAVACHOL) 40 MG tablet TAKE 1 TABLET AT BEDTIME 90 tablet 3  .  valsartan (DIOVAN) 160 MG tablet Take 1 tablet (160 mg total) by mouth daily. 90 tablet 3  . XARELTO 20 MG TABS tablet TAKE 1 TABLET DAILY 90 tablet 3   No current facility-administered medications for this visit.    Review of Systems Review of Systems  Constitutional: Positive for fatigue.  Negative for appetite change, chills, fever and unexpected weight change.  HENT: Negative for mouth sores, nosebleeds, sore throat and trouble swallowing.   Eyes: Negative for eye problems and icterus.  Respiratory:  Negative for cough, hemoptysis, shortness of breath and wheezing.   Cardiovascular: Negative for chest pain and leg swelling.  Gastrointestinal: Negative for abdominal pain, constipation, diarrhea, nausea and vomiting.  Genitourinary: Negative for bladder incontinence, difficulty urinating, dysuria, frequency and hematuria.   Musculoskeletal: Positive for right shoulder pain. negative for back pain, gait problem, neck pain and neck stiffness.  Skin: Negative for itching and rash.  Neurological: Negative for dizziness, extremity weakness, gait problem, headaches, light-headedness and seizures.  Hematological: Negative for adenopathy. Does not bruise/bleed easily.  Psychiatric/Behavioral: Negative for confusion, depression and sleep disturbance. The patient is not nervous/anxious.     PHYSICAL EXAMINATION:  Blood pressure (!) 148/75, temperature 98 F (36.7 C), temperature source Temporal, height 5\' 10"  (1.778 m), weight 250 lb 3.2 oz (113.5 kg).  ECOG PERFORMANCE STATUS: 0  Physical Exam  Constitutional: Oriented to person, place, and time and well-developed, well-nourished, and in no distress.  HENT:  Head: Normocephalic and atraumatic.  Mouth/Throat: Oropharynx is clear and moist. No oropharyngeal exudate.  Eyes: Conjunctivae are normal. Right eye exhibits no discharge. Left eye exhibits no discharge. No scleral icterus.  Neck: Normal range of motion. Neck supple.  Cardiovascular:  Normal rate, regular rhythm, normal heart sounds and intact distal pulses.   Pulmonary/Chest: Effort normal and breath sounds normal. No respiratory distress. No wheezes. No rales.  Abdominal: Soft. Bowel sounds are normal. Exhibits no distension and no mass. There is no tenderness.  Musculoskeletal: Normal range of motion. Exhibits no edema.  Lymphadenopathy:    No cervical adenopathy.  Neurological: Alert and oriented to person, place, and time. Exhibits normal muscle tone. Gait normal. Coordination normal.  Skin: Skin is warm and dry. No rash noted. Not diaphoretic. No erythema. No pallor.  Psychiatric: Mood, memory and judgment normal.  Vitals reviewed.  LABORATORY DATA: Lab Results  Component Value Date   WBC 4.1 02/26/2020   HGB 14.0 02/26/2020   HCT 42.6 02/26/2020   MCV 89.5 02/26/2020   PLT 115 (L) 02/26/2020      Chemistry      Component Value Date/Time   NA 141 02/26/2020 1329   NA 141 06/27/2015 0000   K 4.1 02/26/2020 1329   CL 106 02/26/2020 1329   CO2 25 02/26/2020 1329   BUN 22 02/26/2020 1329   BUN 21 06/27/2015 0000   CREATININE 0.94 02/26/2020 1329   GLU 121 06/27/2015 0000      Component Value Date/Time   CALCIUM 9.0 02/26/2020 1329   ALKPHOS 44 02/26/2020 1329   AST 19 02/26/2020 1329   ALT 17 02/26/2020 1329   BILITOT 0.7 02/26/2020 1329       RADIOGRAPHIC STUDIES: No results found.  ASSESSMENT: This is a very pleasant 86 year old Caucasian male referred to the clinic of leukopenia and thrombocytopenia.   PLAN: The patient was seen with Dr. Julien Nordmann today.  The patient had several lab studies performed to further evaluate his condition.  The patient had a CBC, CMP, LDH, hepatitis panel, HIV testing, folate, vitamin B12, ferritin, and iron studies performed.  The patient CBC was unremarkable except for persistent thrombocytopenia with a platelet count of 113 K.  The patient's iron studies, ferritin, LDH, and CMP were unremarkable.  His other lab  studies are still pending at this time.  Dr. Julien Nordmann believes that the patient's condition is likely drug-induced, possibly due to his statins; however, we will contact the patient if any of his other lab studies are abnormal.  If his other lab studies  are unremarkable, then no further testing is required and the patient may continue on observation since he is asymptomatic.  Dr. Julien Nordmann discussed that the patient may discuss discontinuing his statins for 2 to 3 months with his cardiologist and have follow-up testing performed to see if that improves his cytopenias.  If his labs continue to be low at that time, we be happy to see him again for further evaluation.  The patient voices understanding of current disease status and treatment options and is in agreement with the current care plan.  All questions were answered. The patient knows to call the clinic with any problems, questions or concerns. We can certainly see the patient much sooner if necessary.  Thank you so much for allowing me to participate in the care of REILEY CURTAIN. I will continue to follow up the patient with you and assist in his care.  The total time spent in the appointment was 60 minutes.  Disclaimer: This note was dictated with voice recognition software. Similar sounding words can inadvertently be transcribed and may not be corrected upon review.   [place] Feb 26, 2020, 2:45 PM  ADDENDUM: Hematology/Oncology Attending: I had a face-to-face encounter with the patient today.  I recommended his care plan.  This is a very pleasant 86 years old white male with intermittent bicytopenia mainly leukocytopenia and thrombocytopenia that has been going on for several years.  The patient had extensive work-up by his primary care physician that was unrevealing.  He is here today for evaluation and recommendation regarding his condition.  Initial blood work including repeat CBC showed normal white blood count but  the patient continues to have mild thrombocytopenia.  He has no evidence for anemia.  He has normal iron study as well as ferritin.  He also has normal vitamin B12 level and serum folate.  Hepatitis panel and HIV were nonreactive. The patient has been on treatment with Pravachol for suspicious dyslipidemia for several years. I think his condition is drug-induced secondary to Pravachol.  The patient mentioned that his cholesterol levels are within the acceptable range and he is currently on Pravachol as prophylactic. I recommended for the patient to hold his treatment with Pravachol for the next 2 months and then have repeat CBC by his primary care physician. If there is no improvement in his condition or worsening pancytopenia, I will be happy to reevaluate the patient again but for now we will release him to his primary care physician. The patient was advised to call immediately if he has any concerning symptoms in the interval.  Disclaimer: This note was dictated with voice recognition software. Similar sounding words can inadvertently be transcribed and may be missed upon review. Eilleen Kempf, MD 02/26/20

## 2020-02-27 ENCOUNTER — Telehealth: Payer: Self-pay | Admitting: Physician Assistant

## 2020-02-27 DIAGNOSIS — M25511 Pain in right shoulder: Secondary | ICD-10-CM | POA: Diagnosis not present

## 2020-02-27 DIAGNOSIS — S46011D Strain of muscle(s) and tendon(s) of the rotator cuff of right shoulder, subsequent encounter: Secondary | ICD-10-CM | POA: Diagnosis not present

## 2020-02-27 DIAGNOSIS — M6281 Muscle weakness (generalized): Secondary | ICD-10-CM | POA: Diagnosis not present

## 2020-02-27 DIAGNOSIS — M25611 Stiffness of right shoulder, not elsewhere classified: Secondary | ICD-10-CM | POA: Diagnosis not present

## 2020-02-27 NOTE — Telephone Encounter (Signed)
Called the patient to review the rest of his lab results that were performed yesterday. His vitamin B12 was low. Discussed with the patient that we recommend monthly vitamin B12 injections or B12 oral supplements. I reviewed that this can be done at his primary care providers office; however, we would certainly be happy to arrange for it here if needed. The patient states that he lives close to his PCPs office and would prefer to have it performed there. He knows that we would be happy to see him back in the future if needed. He was appreciative for the call.

## 2020-02-28 ENCOUNTER — Telehealth: Payer: Self-pay | Admitting: Family Medicine

## 2020-02-28 NOTE — Telephone Encounter (Signed)
Left message for patient to call back. Would patient like to do injections at home or in office?

## 2020-02-28 NOTE — Telephone Encounter (Signed)
Patient's Hematologist ran test and found that his B12 was low and recommended that he got B12 shots once a month.  The PA was sending his information over to Dr. Sarajane Jews.  He hasn't received a call so is wanting a call back to set up these visits for injections.

## 2020-02-28 NOTE — Telephone Encounter (Signed)
Yes please start him on monthly B12 shots. We can check another level in 6 months

## 2020-02-29 ENCOUNTER — Encounter: Payer: Self-pay | Admitting: Family Medicine

## 2020-02-29 ENCOUNTER — Other Ambulatory Visit: Payer: Self-pay

## 2020-02-29 DIAGNOSIS — M25511 Pain in right shoulder: Secondary | ICD-10-CM | POA: Diagnosis not present

## 2020-02-29 DIAGNOSIS — S46011D Strain of muscle(s) and tendon(s) of the rotator cuff of right shoulder, subsequent encounter: Secondary | ICD-10-CM | POA: Diagnosis not present

## 2020-02-29 DIAGNOSIS — M6281 Muscle weakness (generalized): Secondary | ICD-10-CM | POA: Diagnosis not present

## 2020-02-29 DIAGNOSIS — M25611 Stiffness of right shoulder, not elsewhere classified: Secondary | ICD-10-CM | POA: Diagnosis not present

## 2020-02-29 NOTE — Telephone Encounter (Signed)
Please set him up to get the B12 shots here at [facility]

## 2020-03-01 ENCOUNTER — Ambulatory Visit (INDEPENDENT_AMBULATORY_CARE_PROVIDER_SITE_OTHER): Payer: Medicare Other | Admitting: *Deleted

## 2020-03-01 DIAGNOSIS — E538 Deficiency of other specified B group vitamins: Secondary | ICD-10-CM

## 2020-03-01 MED ORDER — CYANOCOBALAMIN 1000 MCG/ML IJ SOLN
1000.0000 ug | Freq: Once | INTRAMUSCULAR | Status: AC
  Administered 2020-03-01: 1000 ug via INTRAMUSCULAR

## 2020-03-01 NOTE — Progress Notes (Signed)
Patient in office today for first B-12 injection. Injection administered in Left Deltoid with no immediate reaction.

## 2020-03-02 ENCOUNTER — Other Ambulatory Visit: Payer: Self-pay | Admitting: Family Medicine

## 2020-03-05 NOTE — Telephone Encounter (Signed)
Patient has received his first B12 injection in office.

## 2020-03-07 DIAGNOSIS — S46011D Strain of muscle(s) and tendon(s) of the rotator cuff of right shoulder, subsequent encounter: Secondary | ICD-10-CM | POA: Diagnosis not present

## 2020-03-07 DIAGNOSIS — M6281 Muscle weakness (generalized): Secondary | ICD-10-CM | POA: Diagnosis not present

## 2020-03-07 DIAGNOSIS — M25611 Stiffness of right shoulder, not elsewhere classified: Secondary | ICD-10-CM | POA: Diagnosis not present

## 2020-03-07 DIAGNOSIS — M25511 Pain in right shoulder: Secondary | ICD-10-CM | POA: Diagnosis not present

## 2020-03-26 DIAGNOSIS — Z950 Presence of cardiac pacemaker: Secondary | ICD-10-CM | POA: Diagnosis not present

## 2020-03-26 DIAGNOSIS — I4891 Unspecified atrial fibrillation: Secondary | ICD-10-CM | POA: Diagnosis not present

## 2020-03-27 ENCOUNTER — Telehealth: Payer: Self-pay

## 2020-03-27 ENCOUNTER — Encounter: Payer: Self-pay | Admitting: Family Medicine

## 2020-03-27 NOTE — Telephone Encounter (Signed)
Received voicemail message from patient ok per cardiologist to stop statins   Cassie Heilingoetter, PA made aware

## 2020-03-28 DIAGNOSIS — Z961 Presence of intraocular lens: Secondary | ICD-10-CM | POA: Diagnosis not present

## 2020-03-28 DIAGNOSIS — H353131 Nonexudative age-related macular degeneration, bilateral, early dry stage: Secondary | ICD-10-CM | POA: Diagnosis not present

## 2020-03-28 DIAGNOSIS — H04123 Dry eye syndrome of bilateral lacrimal glands: Secondary | ICD-10-CM | POA: Diagnosis not present

## 2020-03-28 DIAGNOSIS — D3131 Benign neoplasm of right choroid: Secondary | ICD-10-CM | POA: Diagnosis not present

## 2020-03-29 ENCOUNTER — Other Ambulatory Visit: Payer: Self-pay

## 2020-04-01 ENCOUNTER — Other Ambulatory Visit: Payer: Self-pay

## 2020-04-01 ENCOUNTER — Ambulatory Visit (INDEPENDENT_AMBULATORY_CARE_PROVIDER_SITE_OTHER): Payer: Medicare Other

## 2020-04-01 DIAGNOSIS — E538 Deficiency of other specified B group vitamins: Secondary | ICD-10-CM | POA: Diagnosis not present

## 2020-04-01 MED ORDER — CYANOCOBALAMIN 1000 MCG/ML IJ SOLN
1000.0000 ug | Freq: Once | INTRAMUSCULAR | Status: AC
  Administered 2020-04-01: 1000 ug via INTRAMUSCULAR

## 2020-04-01 NOTE — Progress Notes (Signed)
Per orders of Dr. Fry, injection of Cyanocobalamin 1,000 mcg/mL given by Tomara Youngberg N Maryama Kuriakose. Patient tolerated injection well.  

## 2020-04-03 ENCOUNTER — Encounter (INDEPENDENT_AMBULATORY_CARE_PROVIDER_SITE_OTHER): Payer: Medicare Other | Admitting: Ophthalmology

## 2020-04-03 ENCOUNTER — Other Ambulatory Visit: Payer: Self-pay

## 2020-04-03 DIAGNOSIS — I10 Essential (primary) hypertension: Secondary | ICD-10-CM | POA: Diagnosis not present

## 2020-04-03 DIAGNOSIS — D3131 Benign neoplasm of right choroid: Secondary | ICD-10-CM | POA: Diagnosis not present

## 2020-04-03 DIAGNOSIS — H35033 Hypertensive retinopathy, bilateral: Secondary | ICD-10-CM | POA: Diagnosis not present

## 2020-04-03 DIAGNOSIS — H353231 Exudative age-related macular degeneration, bilateral, with active choroidal neovascularization: Secondary | ICD-10-CM

## 2020-04-03 DIAGNOSIS — H43813 Vitreous degeneration, bilateral: Secondary | ICD-10-CM

## 2020-04-24 ENCOUNTER — Other Ambulatory Visit: Payer: Self-pay

## 2020-04-24 ENCOUNTER — Encounter (INDEPENDENT_AMBULATORY_CARE_PROVIDER_SITE_OTHER): Payer: Medicare Other | Admitting: Ophthalmology

## 2020-04-24 DIAGNOSIS — H43813 Vitreous degeneration, bilateral: Secondary | ICD-10-CM | POA: Diagnosis not present

## 2020-04-24 DIAGNOSIS — H35033 Hypertensive retinopathy, bilateral: Secondary | ICD-10-CM

## 2020-04-24 DIAGNOSIS — I10 Essential (primary) hypertension: Secondary | ICD-10-CM | POA: Diagnosis not present

## 2020-04-24 DIAGNOSIS — H353231 Exudative age-related macular degeneration, bilateral, with active choroidal neovascularization: Secondary | ICD-10-CM

## 2020-05-01 ENCOUNTER — Ambulatory Visit (INDEPENDENT_AMBULATORY_CARE_PROVIDER_SITE_OTHER): Payer: Medicare Other

## 2020-05-01 ENCOUNTER — Other Ambulatory Visit: Payer: Self-pay

## 2020-05-01 DIAGNOSIS — E538 Deficiency of other specified B group vitamins: Secondary | ICD-10-CM | POA: Diagnosis not present

## 2020-05-01 MED ORDER — CYANOCOBALAMIN 1000 MCG/ML IJ SOLN
1000.0000 ug | Freq: Once | INTRAMUSCULAR | Status: AC
  Administered 2020-05-01: 1000 ug via INTRAMUSCULAR

## 2020-05-01 NOTE — Progress Notes (Signed)
Pt came in for his B12 injection. Injection given in the right deltoid & pt tolerated well.

## 2020-05-02 ENCOUNTER — Encounter (INDEPENDENT_AMBULATORY_CARE_PROVIDER_SITE_OTHER): Payer: Medicare Other | Admitting: Ophthalmology

## 2020-05-02 DIAGNOSIS — H353231 Exudative age-related macular degeneration, bilateral, with active choroidal neovascularization: Secondary | ICD-10-CM | POA: Diagnosis not present

## 2020-05-02 DIAGNOSIS — I10 Essential (primary) hypertension: Secondary | ICD-10-CM | POA: Diagnosis not present

## 2020-05-02 DIAGNOSIS — H35033 Hypertensive retinopathy, bilateral: Secondary | ICD-10-CM | POA: Diagnosis not present

## 2020-05-02 DIAGNOSIS — D3131 Benign neoplasm of right choroid: Secondary | ICD-10-CM

## 2020-05-02 DIAGNOSIS — H43813 Vitreous degeneration, bilateral: Secondary | ICD-10-CM

## 2020-05-03 ENCOUNTER — Encounter: Payer: Self-pay | Admitting: Family Medicine

## 2020-05-03 DIAGNOSIS — E538 Deficiency of other specified B group vitamins: Secondary | ICD-10-CM

## 2020-05-06 DIAGNOSIS — E538 Deficiency of other specified B group vitamins: Secondary | ICD-10-CM | POA: Insufficient documentation

## 2020-05-06 NOTE — Telephone Encounter (Signed)
Please advise 

## 2020-05-06 NOTE — Telephone Encounter (Unsigned)
He is correct, I put in the order

## 2020-05-09 DIAGNOSIS — H16141 Punctate keratitis, right eye: Secondary | ICD-10-CM | POA: Diagnosis not present

## 2020-05-09 DIAGNOSIS — H1131 Conjunctival hemorrhage, right eye: Secondary | ICD-10-CM | POA: Diagnosis not present

## 2020-05-13 DIAGNOSIS — H6123 Impacted cerumen, bilateral: Secondary | ICD-10-CM | POA: Diagnosis not present

## 2020-05-19 ENCOUNTER — Encounter: Payer: Self-pay | Admitting: Family Medicine

## 2020-05-19 DIAGNOSIS — Z95818 Presence of other cardiac implants and grafts: Secondary | ICD-10-CM | POA: Diagnosis not present

## 2020-05-21 ENCOUNTER — Other Ambulatory Visit: Payer: Medicare Other

## 2020-05-21 ENCOUNTER — Other Ambulatory Visit: Payer: Self-pay

## 2020-05-21 DIAGNOSIS — E538 Deficiency of other specified B group vitamins: Secondary | ICD-10-CM

## 2020-05-22 LAB — VITAMIN B12: Vitamin B-12: 382 pg/mL (ref 200–1100)

## 2020-05-30 ENCOUNTER — Encounter (INDEPENDENT_AMBULATORY_CARE_PROVIDER_SITE_OTHER): Payer: Medicare Other | Admitting: Ophthalmology

## 2020-05-30 ENCOUNTER — Other Ambulatory Visit: Payer: Self-pay

## 2020-05-30 DIAGNOSIS — H35033 Hypertensive retinopathy, bilateral: Secondary | ICD-10-CM | POA: Diagnosis not present

## 2020-05-30 DIAGNOSIS — D3131 Benign neoplasm of right choroid: Secondary | ICD-10-CM | POA: Diagnosis not present

## 2020-05-30 DIAGNOSIS — I10 Essential (primary) hypertension: Secondary | ICD-10-CM | POA: Diagnosis not present

## 2020-05-30 DIAGNOSIS — H353231 Exudative age-related macular degeneration, bilateral, with active choroidal neovascularization: Secondary | ICD-10-CM | POA: Diagnosis not present

## 2020-05-30 DIAGNOSIS — H43813 Vitreous degeneration, bilateral: Secondary | ICD-10-CM

## 2020-06-03 ENCOUNTER — Other Ambulatory Visit: Payer: Self-pay

## 2020-06-03 ENCOUNTER — Ambulatory Visit (INDEPENDENT_AMBULATORY_CARE_PROVIDER_SITE_OTHER): Payer: Medicare Other | Admitting: *Deleted

## 2020-06-03 DIAGNOSIS — E538 Deficiency of other specified B group vitamins: Secondary | ICD-10-CM

## 2020-06-03 MED ORDER — CYANOCOBALAMIN 1000 MCG/ML IJ SOLN
1000.0000 ug | Freq: Once | INTRAMUSCULAR | Status: AC
  Administered 2020-06-03: 1000 ug via INTRAMUSCULAR

## 2020-06-03 NOTE — Progress Notes (Signed)
Patient in for B-12 injection. Injection administered with no reaction

## 2020-06-12 ENCOUNTER — Encounter: Payer: Self-pay | Admitting: Primary Care

## 2020-06-12 ENCOUNTER — Ambulatory Visit (INDEPENDENT_AMBULATORY_CARE_PROVIDER_SITE_OTHER): Payer: Medicare Other | Admitting: Primary Care

## 2020-06-12 ENCOUNTER — Other Ambulatory Visit: Payer: Self-pay

## 2020-06-12 DIAGNOSIS — J449 Chronic obstructive pulmonary disease, unspecified: Secondary | ICD-10-CM | POA: Diagnosis not present

## 2020-06-12 DIAGNOSIS — G4733 Obstructive sleep apnea (adult) (pediatric): Secondary | ICD-10-CM | POA: Diagnosis not present

## 2020-06-12 NOTE — Assessment & Plan Note (Addendum)
-   Patient is 100% compliant with CPAP and reports benefit form use.  - Auto CPAP 5-20cm h20 (05/01/20-06/09/20); average AHI 3.7 with median CPAP 8 and 95th percentile CPAP 11 - Change pressure auto CPAP 5-15cm h20  - Advised patient to continue to wear CPAP appliance every night for min 4-6 hours or more - DME company is [employer] - FU in 1 year with Dr. Halford Chessman or APP

## 2020-06-12 NOTE — Progress Notes (Signed)
@Patient  ID: Guy Moore, male    DOB: 1933/11/02, 86 y.o.   MRN: [id number]  Chief Complaint  Patient presents with  . Follow-up    Referring provider: Laurey Morale, MD  HPI: 86 year old male, former smoker. PMH significant for COPD with asthma. Patient of Dr. Halford Chessman, last seen in September 2020. PSG 07/24/09 >> AHI 58, SpO2 low 74%.   06/12/2020 Patient presents today for annual follow-up. He is doing very well. No respiratory complaints. He has not had asthma/COPD exacerbation in the last year. Continues Flovent Diskus 74mcg 1 puff twice daily. Rare Albuterol rescue inhaler use, mostly in the summer with heat/humidity. He has not needed to use nebulizer. He reports 100% compliance with CPAP, he did not bring in SD card today. He uses it when he takes a nap. Reports improvement in sleep with CPAP. Reports feeling CPAP pressure is too strong/intense at times in the middle of the night. Use full face mask. DME company is [employer]. He has not been able to get distilled water.   He was found to have leukocytosis, thrombocytopenia and B12 deficency during physical exam, sent to hematologist. Felt this may be d/t his cholesterol medication. He is not taking cholesterol medication for the last 3 months. He has been getting B12 injections once a month. Reports feeling better with more energy. He is following with opthalmology for macular degeneration. Vision is distorted and he has floaters. He is getting shots for this.   Airview download 05/11/20-06/09/20: 30/30 days used; 100% > 4 hours Average usage 9 hours 53 mins Pressure 5-20 (11.7- 95%) Airleaks 27.2 (95%) AHI 3.7  No Known Allergies  Immunization History  Administered Date(s) Administered  . Fluad Quad(high Dose 65+) 06/21/2019  . Influenza Whole 08/12/2006, 10/21/2007, 07/10/2008, 07/19/2009, 07/12/2012  . Influenza, High Dose Seasonal PF 08/25/2018  . Influenza, Seasonal, Injecte, Preservative Fre 08/04/2013  .  Influenza-Unspecified 08/16/2015, 08/15/2016, 08/29/2017  . PFIZER SARS-COV-2 Vaccination 11/19/2019, 12/13/2019  . Pneumococcal Conjugate-13 01/17/2015  . Pneumococcal Polysaccharide-23 03/14/2013  . Td 10/09/2005  . Zoster 03/19/2009    Past Medical History:  Diagnosis Date  . Allergic rhinitis   . Arthritis   . Asthma   . Atrial fibrillation [facility])    sees Dr. [name] at [facility]   . Atrial flutter ([place])   . BCC (basal cell carcinoma of skin)    Nose  . COPD with emphysema ([place])    sees Dr. [name]   . Depression   . Diabetes mellitus ([place])    Diet control   . Diverticulosis   . Erosive esophagitis   . Esophageal stricture   . Gastric ulcer   . Hearing loss    uses amplification  . Hemorrhoids   . Hiatal hernia   . HTN (hypertension)   . Hyperlipidemia   . 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 Sleep apnea     Tobacco History: Social History   Tobacco Use  Smoking Status Former Smoker  . Packs/day: 1.00  . Years: 28.00  . Pack years: 28.00  . Types: Cigarettes  . Quit date: 10/12/1977  . Years since quitting: 42.6  Smokeless Tobacco Never Used   Counseling given: Not Answered   Outpatient Medications Prior to Visit  Medication Sig Dispense Refill  . acetaminophen (TYLENOL) 500 MG tablet Take 500-1,000 mg by mouth every 6 (six) hours as needed (for pain).    Marland Kitchen albuterol (PROVENTIL) (2.5 MG/3ML)  0.083% nebulizer solution Take 3 mLs (2.5 mg total) by nebulization every 4 (four) hours as needed for wheezing or shortness of breath. 75 mL 5  . albuterol (VENTOLIN HFA) 108 (90 Base) MCG/ACT inhaler Inhale 2 puffs into the lungs every 6 (six) hours as needed for wheezing or shortness of breath. 56 g 1  . amLODipine (NORVASC) 5 MG tablet TAKE 1 TABLET DAILY 90 tablet 3  . fluticasone (FLOVENT DISKUS) 50 MCG/BLIST diskus inhaler USE 1 INHALATION TWICE A DAY (NEED APPOINTMENT FOR REFILLS) 180 each 3  . MAGNESIUM GLUCONATE  PO Take 1 tablet by mouth daily.     . Methylcellulose, Laxative, (CITRUCEL PO) Take 1 tablet by mouth daily after breakfast.     . Multiple Vitamins-Minerals (PRESERVISION AREDS 2) CAPS Take 1 capsule by mouth 2 (two) times daily.    Marland Kitchen omeprazole (PRILOSEC) 40 MG capsule TAKE 1 CAPSULE DAILY 90 capsule 3  . potassium chloride SA (K-DUR) 20 MEQ tablet TAKE 1 TABLET DAILY 90 tablet 3  . valsartan (DIOVAN) 160 MG tablet Take 1 tablet (160 mg total) by mouth daily. 90 tablet 3  . XARELTO 20 MG TABS tablet TAKE 1 TABLET DAILY 90 tablet 3  . pravastatin (PRAVACHOL) 40 MG tablet TAKE 1 TABLET AT BEDTIME (Patient not taking: Reported on 06/12/2020) 90 tablet 3   No facility-administered medications prior to visit.    Review of Systems  Review of Systems  Constitutional: Negative for fatigue.  Respiratory: Negative for cough, chest tightness, shortness of breath and wheezing.   Cardiovascular: Negative.    Physical Exam  BP 128/84 (BP Location: Left Arm, Cuff Size: Normal)   Pulse 91   Temp 98.5 F (36.9 C) (Oral)   Ht 5\' 10"  (1.778 m)   Wt 248 lb 12.8 oz (112.9 kg)   SpO2 96%   BMI 35.70 kg/m  Physical Exam Constitutional:      Appearance: Normal appearance.  HENT:     Head: Normocephalic and atraumatic.     Right Ear: Tympanic membrane normal.     Left Ear: Tympanic membrane normal.     Mouth/Throat:     Mouth: Mucous membranes are moist.     Pharynx: Oropharynx is clear.  Cardiovascular:     Rate and Rhythm: Normal rate and regular rhythm.  Pulmonary:     Effort: Pulmonary effort is normal.     Breath sounds: Normal breath sounds. No wheezing, rhonchi or rales.  Musculoskeletal:     Cervical back: Normal range of motion and neck supple.  Skin:    General: Skin is warm and dry.  Neurological:     General: No focal deficit present.     Mental Status: He is alert and oriented to person, place, and time. Mental status is at baseline.  Psychiatric:        Mood and Affect:  Mood normal.        Behavior: Behavior normal.        Thought Content: Thought content normal.        Judgment: Judgment normal.      Lab Results:  CBC    Component Value Date/Time   WBC 4.1 02/26/2020 1329   WBC 3.1 (L) 02/21/2020 1006   RBC 4.76 02/26/2020 1329   HGB 14.0 02/26/2020 1329   HCT 42.6 02/26/2020 1329   PLT 115 (L) 02/26/2020 1329   MCV 89.5 02/26/2020 1329   MCH 29.4 02/26/2020 1329   MCHC 32.9 02/26/2020 1329   RDW 14.6  02/26/2020 1329   LYMPHSABS 0.5 (L) 02/26/2020 1329   MONOABS 0.4 02/26/2020 1329   EOSABS 0.1 02/26/2020 1329   BASOSABS 0.0 02/26/2020 1329    BMET    Component Value Date/Time   NA 141 02/26/2020 1329   NA 141 06/27/2015 0000   K 4.1 02/26/2020 1329   CL 106 02/26/2020 1329   CO2 25 02/26/2020 1329   GLUCOSE 103 (H) 02/26/2020 1329   BUN 22 02/26/2020 1329   BUN 21 06/27/2015 0000   CREATININE 0.94 02/26/2020 1329   CALCIUM 9.0 02/26/2020 1329   GFRNONAA >60 02/26/2020 1329   GFRAA >60 02/26/2020 1329    BNP    Component Value Date/Time   BNP 30.0 12/16/2014 1016    ProBNP No results found for: PROBNP  Imaging: No results found.   Assessment & Plan:   OBSTRUCTIVE SLEEP APNEA - Patient is 100% compliant with CPAP and reports benefit form use.  - Auto CPAP 5-20cm h20 (05/01/20-06/09/20); average AHI 3.7 with median CPAP 8 and 95th percentile CPAP 11 - Change pressure auto CPAP 5-15cm h20  - Advised patient to continue to wear CPAP appliance every night for min 4-6 hours or more - DME company is [employer] - FU in 1 year with Dr. Halford Chessman or APP   COPD with asthma ([place]) - Well controlled on ICS inhaler; no recent exacerbation - Continue Flovent Diskus 35mcg one puff twice daily; prn ventolin q 6 hours for shortness of breath/whezing    Martyn Ehrich, NP 06/12/2020

## 2020-06-12 NOTE — Assessment & Plan Note (Signed)
-   Well controlled on ICS inhaler; no recent exacerbation - Continue Flovent Diskus 47mcg one puff twice daily; prn ventolin q 6 hours for shortness of breath/whezing

## 2020-06-12 NOTE — Patient Instructions (Signed)
Asthma/COPD: - Continue Flovent 1 puffs twice daily - Use albuterol rescue inhaler 2 puffs every 4-6 hours as needed for shortness of breath/wheezing   OSA: - Continue to wear CPAP for 4-6 hours or more each night - Do not drive if experiencing excessive daytime fatigue or somnolence   Orders: - Change CPAP pressure 5-15cm h20   Follow-up: - 1 year with Dr. Halford Chessman    CPAP and BPAP Information CPAP and BPAP are methods of helping a person breathe with the use of air pressure. CPAP stands for "continuous positive airway pressure." BPAP stands for "bi-level positive airway pressure." In both methods, air is blown through your nose or mouth and into your air passages to help you breathe well. CPAP and BPAP use different amounts of pressure to blow air. With CPAP, the amount of pressure stays the same while you breathe in and out. With BPAP, the amount of pressure is increased when you breathe in (inhale) so that you can take larger breaths. Your health care provider will recommend whether CPAP or BPAP would be more helpful for you. Why are CPAP and BPAP treatments used? CPAP or BPAP can be helpful if you have:  Sleep apnea.  Chronic obstructive pulmonary disease (COPD).  Heart failure.  Medical conditions that weaken the muscles of the chest including muscular dystrophy, or neurological diseases such as amyotrophic lateral sclerosis (ALS).  Other problems that cause breathing to be weak, abnormal, or difficult. CPAP is most commonly used for obstructive sleep apnea (OSA) to keep the airways from collapsing when the muscles relax during sleep. How is CPAP or BPAP administered? Both CPAP and BPAP are provided by a small machine with a flexible plastic tube that attaches to a plastic mask. You wear the mask. Air is blown through the mask into your nose or mouth. The amount of pressure that is used to blow the air can be adjusted on the machine. Your health care provider will determine the  pressure setting that should be used based on your individual needs. When should CPAP or BPAP be used? In most cases, the mask only needs to be worn during sleep. Generally, the mask needs to be worn throughout the night and during any daytime naps. People with certain medical conditions may also need to wear the mask at other times when they are awake. Follow instructions from your health care provider about when to use the machine. What are some tips for using the mask?   Because the mask needs to be snug, some people feel trapped or closed-in (claustrophobic) when first using the mask. If you feel this way, you may need to get used to the mask. One way to do this is by holding the mask loosely over your nose or mouth and then gradually applying the mask more snugly. You can also gradually increase the amount of time that you use the mask.  Masks are available in various types and sizes. Some fit over your mouth and nose while others fit over just your nose. If your mask does not fit well, talk with your health care provider about getting a different one.  If you are using a mask that fits over your nose and you tend to breathe through your mouth, a chin strap may be applied to help keep your mouth closed.  The CPAP and BPAP machines have alarms that may sound if the mask comes off or develops a leak.  If you have trouble with the mask, it is  very important that you talk with your health care provider about finding a way to make the mask easier to tolerate. Do not stop using the mask. Stopping the use of the mask could have a negative impact on your health. What are some tips for using the machine?  Place your CPAP or BPAP machine on a secure table or stand near an electrical outlet.  Know where the on/off switch is located on the machine.  Follow instructions from your health care provider about how to set the pressure on your machine and when you should use it.  Do not eat or drink while  the CPAP or BPAP machine is on. Food or fluids could get pushed into your lungs by the pressure of the CPAP or BPAP.  Do not smoke. Tobacco smoke residue can damage the machine.  For home use, CPAP and BPAP machines can be rented or purchased through home health care companies. Many different brands of machines are available. Renting a machine before purchasing may help you find out which particular machine works well for you.  Keep the CPAP or BPAP machine and attachments clean. Ask your health care provider for specific instructions. Get help right away if:  You have redness or open areas around your nose or mouth where the mask fits.  You have trouble using the CPAP or BPAP machine.  You cannot tolerate wearing the CPAP or BPAP mask.  You have pain, discomfort, and bloating in your abdomen. Summary  CPAP and BPAP are methods of helping a person breathe with the use of air pressure.  Both CPAP and BPAP are provided by a small machine with a flexible plastic tube that attaches to a plastic mask.  If you have trouble with the mask, it is very important that you talk with your health care provider about finding a way to make the mask easier to tolerate. This information is not intended to replace advice given to you by your health care provider. Make sure you discuss any questions you have with your health care provider. Document Revised: 01/18/2019 Document Reviewed: 08/17/2016 Elsevier Patient Education  [place].

## 2020-06-12 NOTE — Progress Notes (Signed)
Reviewed and agree with assessment/plan.   [name], MD [facility] Pulmonary/Critical Care 06/12/2020, 12:12 PM Pager:  [phone]

## 2020-06-20 ENCOUNTER — Encounter: Payer: Self-pay | Admitting: Family Medicine

## 2020-06-20 DIAGNOSIS — E785 Hyperlipidemia, unspecified: Secondary | ICD-10-CM

## 2020-06-21 NOTE — Telephone Encounter (Signed)
I placed the lab orders

## 2020-06-26 ENCOUNTER — Other Ambulatory Visit: Payer: Medicare Other

## 2020-06-26 ENCOUNTER — Other Ambulatory Visit: Payer: Self-pay

## 2020-06-26 DIAGNOSIS — E785 Hyperlipidemia, unspecified: Secondary | ICD-10-CM | POA: Diagnosis not present

## 2020-06-27 ENCOUNTER — Encounter (INDEPENDENT_AMBULATORY_CARE_PROVIDER_SITE_OTHER): Payer: Medicare Other | Admitting: Ophthalmology

## 2020-06-27 DIAGNOSIS — I10 Essential (primary) hypertension: Secondary | ICD-10-CM

## 2020-06-27 DIAGNOSIS — D3131 Benign neoplasm of right choroid: Secondary | ICD-10-CM

## 2020-06-27 DIAGNOSIS — H43813 Vitreous degeneration, bilateral: Secondary | ICD-10-CM | POA: Diagnosis not present

## 2020-06-27 DIAGNOSIS — H353231 Exudative age-related macular degeneration, bilateral, with active choroidal neovascularization: Secondary | ICD-10-CM | POA: Diagnosis not present

## 2020-06-27 DIAGNOSIS — H35033 Hypertensive retinopathy, bilateral: Secondary | ICD-10-CM | POA: Diagnosis not present

## 2020-06-27 LAB — CBC WITH DIFFERENTIAL/PLATELET
Absolute Monocytes: 258 cells/uL (ref 200–950)
Basophils Absolute: 50 cells/uL (ref 0–200)
Basophils Relative: 1.8 %
Eosinophils Absolute: 188 cells/uL (ref 15–500)
Eosinophils Relative: 6.7 %
HCT: 42.7 % (ref 38.5–50.0)
Hemoglobin: 14.4 g/dL (ref 13.2–17.1)
Lymphs Abs: 468 cells/uL — ABNORMAL LOW (ref 850–3900)
MCH: 30.4 pg (ref 27.0–33.0)
MCHC: 33.7 g/dL (ref 32.0–36.0)
MCV: 90.3 fL (ref 80.0–100.0)
MPV: 10.4 fL (ref 7.5–12.5)
Monocytes Relative: 9.2 %
Neutro Abs: 1837 cells/uL (ref 1500–7800)
Neutrophils Relative %: 65.6 %
Platelets: 114 10*3/uL — ABNORMAL LOW (ref 140–400)
RBC: 4.73 10*6/uL (ref 4.20–5.80)
RDW: 13.8 % (ref 11.0–15.0)
Total Lymphocyte: 16.7 %
WBC: 2.8 10*3/uL — ABNORMAL LOW (ref 3.8–10.8)

## 2020-06-27 LAB — LIPID PANEL
Cholesterol: 158 mg/dL (ref <200)
HDL: 60 mg/dL (ref >=40)
LDL Cholesterol (Calc): 87 mg/dL (calc)
Non-HDL Cholesterol (Calc): 98 mg/dL (calc) (ref <130)
Total CHOL/HDL Ratio: 2.6 (calc) (ref <5.0)
Triglycerides: 39 mg/dL (ref <150)

## 2020-06-28 ENCOUNTER — Encounter: Payer: Self-pay | Admitting: Family Medicine

## 2020-07-04 ENCOUNTER — Other Ambulatory Visit: Payer: Self-pay

## 2020-07-04 ENCOUNTER — Ambulatory Visit (INDEPENDENT_AMBULATORY_CARE_PROVIDER_SITE_OTHER): Payer: Medicare Other | Admitting: *Deleted

## 2020-07-04 DIAGNOSIS — E538 Deficiency of other specified B group vitamins: Secondary | ICD-10-CM

## 2020-07-04 MED ORDER — CYANOCOBALAMIN 1000 MCG/ML IJ SOLN
1000.0000 ug | Freq: Once | INTRAMUSCULAR | Status: AC
  Administered 2020-07-04: 1000 ug via INTRAMUSCULAR

## 2020-07-04 NOTE — Progress Notes (Signed)
Patient in for B-12 injection. Injection administered with no immediate reaction.

## 2020-07-08 ENCOUNTER — Telehealth: Payer: Self-pay | Admitting: Family Medicine

## 2020-07-08 ENCOUNTER — Other Ambulatory Visit: Payer: Self-pay | Admitting: Family Medicine

## 2020-07-08 NOTE — Telephone Encounter (Signed)
-  07/08/20- spoke to Guy Moore- said pain and light tinge was getting less and was almost normal this monrning.  He said he would rather wait and see Dr. Sarajane Jews on Wednesday unless it gets worse//tes

## 2020-07-09 ENCOUNTER — Other Ambulatory Visit: Payer: Self-pay

## 2020-07-10 ENCOUNTER — Ambulatory Visit (INDEPENDENT_AMBULATORY_CARE_PROVIDER_SITE_OTHER): Payer: Medicare Other | Admitting: Family Medicine

## 2020-07-10 ENCOUNTER — Encounter: Payer: Self-pay | Admitting: Family Medicine

## 2020-07-10 VITALS — BP 130/82 | HR 73 | Temp 98.1°F | Ht 70.0 in | Wt 250.0 lb

## 2020-07-10 DIAGNOSIS — Z23 Encounter for immunization: Secondary | ICD-10-CM

## 2020-07-10 DIAGNOSIS — R319 Hematuria, unspecified: Secondary | ICD-10-CM

## 2020-07-10 DIAGNOSIS — N390 Urinary tract infection, site not specified: Secondary | ICD-10-CM | POA: Diagnosis not present

## 2020-07-10 DIAGNOSIS — R350 Frequency of micturition: Secondary | ICD-10-CM | POA: Diagnosis not present

## 2020-07-10 LAB — POCT URINALYSIS DIPSTICK
Bilirubin, UA: NEGATIVE
Blood, UA: 10
Glucose, UA: NEGATIVE
Ketones, UA: NEGATIVE
Leukocytes, UA: NEGATIVE
Nitrite, UA: NEGATIVE
Protein, UA: NEGATIVE
Spec Grav, UA: 1.025 (ref 1.010–1.025)
Urobilinogen, UA: 0.2 E.U./dL
pH, UA: 5 (ref 5.0–8.0)

## 2020-07-10 MED ORDER — CIPROFLOXACIN HCL 500 MG PO TABS: 500.0000 mg | ORAL_TABLET | Freq: Two times a day (BID) | ORAL | 0 refills | Status: DC

## 2020-07-10 NOTE — Addendum Note (Signed)
Addended by: Matilde Sprang on: 07/10/2020 05:17 PM   Modules accepted: Orders

## 2020-07-10 NOTE — Progress Notes (Signed)
   Subjective:    Patient ID: Guy Moore, male    DOB: 04-Mar-1934, 86 y.o.   MRN: [id number]  HPI Here for 5 days of intermittent blood streaks in the urine and burning on urination. No fever. Drinking lots of water.    Review of Systems  Constitutional: Negative.   Respiratory: Negative.   Cardiovascular: Negative.   Gastrointestinal: Negative.   Genitourinary: Positive for dysuria, hematuria and urgency. Negative for flank pain and testicular pain.       Objective:   Physical Exam Constitutional:      Appearance: Normal appearance.  Cardiovascular:     Rate and Rhythm: Normal rate and regular rhythm.     Pulses: Normal pulses.     Heart sounds: Normal heart sounds.  Pulmonary:     Effort: Pulmonary effort is normal.     Breath sounds: Normal breath sounds.  Abdominal:     General: Abdomen is flat. Bowel sounds are normal. There is no distension.     Palpations: Abdomen is soft.     Tenderness: There is no abdominal tenderness.  Neurological:     Mental Status: He is alert.           Assessment & Plan:  UTI, treat with Cipro for 10 days. Culture the sample.  Alysia Penna, MD

## 2020-07-11 ENCOUNTER — Telehealth: Payer: Self-pay | Admitting: Family Medicine

## 2020-07-11 LAB — URINE CULTURE
MICRO NUMBER:: 11010380
Result:: NO GROWTH
SPECIMEN QUALITY:: ADEQUATE

## 2020-07-11 NOTE — Telephone Encounter (Signed)
Pt called stated that he went to the bathroom to urinate and it was very very painful with some blood in the urine.  Pt was transferred to the Triage Nurse for further assistance.  Pt was wanting to let Dr. Sarajane Jews know about this b/c this had not happen when he had is appointment with Dr. Sarajane Jews.

## 2020-07-12 ENCOUNTER — Telehealth: Payer: Self-pay | Admitting: Family Medicine

## 2020-07-12 ENCOUNTER — Encounter: Payer: Self-pay | Admitting: Family Medicine

## 2020-07-12 ENCOUNTER — Other Ambulatory Visit: Payer: Self-pay

## 2020-07-12 DIAGNOSIS — R350 Frequency of micturition: Secondary | ICD-10-CM

## 2020-07-12 NOTE — Telephone Encounter (Addendum)
Pt calling would like urine culture test results. [phone]

## 2020-07-12 NOTE — Telephone Encounter (Signed)
Please advise 

## 2020-07-12 NOTE — Telephone Encounter (Signed)
Patient asking for a referral to Dr. Diona Fanti. Referral put in per Dr. Sarajane Jews. Patient called and notified referral had been made and they would contact him to schedule a visit.

## 2020-07-12 NOTE — Telephone Encounter (Signed)
Pt is calling to get his lab results he stated that he has gotten the results on his mychart and did not know why he has not gotten a call back pt is aware that when the provider receives the labs he will result them and someone will give him a call.  Pt state that he does not want to continue with the cipro if he will have a increase in bleeding from it.  Pt state that he is still in a lot of pain and does not want to wait until next week.

## 2020-07-12 NOTE — Telephone Encounter (Signed)
See my Result Note  

## 2020-07-15 ENCOUNTER — Telehealth: Payer: Self-pay | Admitting: *Deleted

## 2020-07-15 NOTE — Telephone Encounter (Signed)
Mr Holeman left a message stating he stopped his statin X 3 months as directed. Had labs checked by PCP. WBC and platelets are still low. Wanted to make you aware.

## 2020-07-17 ENCOUNTER — Telehealth: Payer: Self-pay | Admitting: Physician Assistant

## 2020-07-17 DIAGNOSIS — R300 Dysuria: Secondary | ICD-10-CM | POA: Diagnosis not present

## 2020-07-17 DIAGNOSIS — R350 Frequency of micturition: Secondary | ICD-10-CM | POA: Diagnosis not present

## 2020-07-17 DIAGNOSIS — R310 Gross hematuria: Secondary | ICD-10-CM | POA: Diagnosis not present

## 2020-07-17 NOTE — Telephone Encounter (Signed)
Can you make him a labs and follow up visit in like 2-3 weeks on the least busy day (but a day Dr. Julien Nordmann is here)

## 2020-07-17 NOTE — Telephone Encounter (Signed)
Scheduled appt per 10/6 sch msg - pt is aware of appt date and time   

## 2020-07-22 DIAGNOSIS — N200 Calculus of kidney: Secondary | ICD-10-CM | POA: Diagnosis not present

## 2020-07-22 DIAGNOSIS — R310 Gross hematuria: Secondary | ICD-10-CM | POA: Diagnosis not present

## 2020-07-25 ENCOUNTER — Other Ambulatory Visit: Payer: Self-pay

## 2020-07-25 ENCOUNTER — Encounter (INDEPENDENT_AMBULATORY_CARE_PROVIDER_SITE_OTHER): Payer: Medicare Other | Admitting: Ophthalmology

## 2020-07-25 DIAGNOSIS — I10 Essential (primary) hypertension: Secondary | ICD-10-CM

## 2020-07-25 DIAGNOSIS — H43813 Vitreous degeneration, bilateral: Secondary | ICD-10-CM | POA: Diagnosis not present

## 2020-07-25 DIAGNOSIS — H353231 Exudative age-related macular degeneration, bilateral, with active choroidal neovascularization: Secondary | ICD-10-CM | POA: Diagnosis not present

## 2020-07-25 DIAGNOSIS — H35033 Hypertensive retinopathy, bilateral: Secondary | ICD-10-CM

## 2020-07-25 DIAGNOSIS — D3131 Benign neoplasm of right choroid: Secondary | ICD-10-CM | POA: Diagnosis not present

## 2020-07-27 DIAGNOSIS — R310 Gross hematuria: Secondary | ICD-10-CM | POA: Diagnosis not present

## 2020-08-01 ENCOUNTER — Encounter: Payer: Self-pay | Admitting: Family Medicine

## 2020-08-02 ENCOUNTER — Ambulatory Visit (INDEPENDENT_AMBULATORY_CARE_PROVIDER_SITE_OTHER): Payer: Medicare Other | Admitting: *Deleted

## 2020-08-02 ENCOUNTER — Other Ambulatory Visit: Payer: Self-pay

## 2020-08-02 DIAGNOSIS — E538 Deficiency of other specified B group vitamins: Secondary | ICD-10-CM

## 2020-08-02 MED ORDER — CYANOCOBALAMIN 1000 MCG/ML IJ SOLN
1000.0000 ug | Freq: Once | INTRAMUSCULAR | Status: AC
  Administered 2020-08-02: 1000 ug via INTRAMUSCULAR

## 2020-08-02 NOTE — Progress Notes (Signed)
Patient in office for B-12 injection. Injection administered with no immediate reactions.

## 2020-08-03 NOTE — Progress Notes (Signed)
[facility] OFFICE PROGRESS NOTE  Laurey Morale, MD [place] [id number]  DIAGNOSIS: Intermittent Bicytopenia  PRIOR THERAPY: None  CURRENT THERAPY: Observation   INTERVAL HISTORY: Guy Moore 86 y.o. male returns to clinic today for a follow-up visit accompanied by his wife.  The patient was 1st referred to the clinic in May 2021 for intermittent periods of bi/pancytopenia.  The patient had a work-up which included CBC, CMP, LDH, hepatitis panel, HIV, folate, B12, iron studies, and a previous autoimmune work-up which was unremarkable except for B12 deficiency. He has been receiving B12 injections at his PCP. His last B12 lab work was performed on 8/10 and his B12 was 382 at that time. It was thought at that time that his lab work may be secondary to a drug side effects, possibly with his statins.The patient held his statins for few months without significant improvement in his lab work. Of note, the patient inquired about prior agent orange exposure and wonders if that can be a contributing factor. The patient recently had a CBC performed by his primary care provider in September 2021 which noted persistent leukopenia with a white blood cell count of 2.8, and platelet count slightly low at 114,000.   Overall, the patient is feeling fairly well except for fatigue and lightheadedness. 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  He states he checks his pulse and BP during these episodes and that they are unremarkable. He also has been receiving injections for macular degeneration. Also in the interval, he had hematuria and saw urology. He is schedule for a cystoscopy in the next few weeks. He denies any frequent or recent signs and symptoms of infection.  He denies any abnormal bleeding or bruising besides the hematuria.  The patient denies any fever, chills, night sweats, or unexplained weight loss. The patient is here today for evaluation and for  recommendations regarding further work-up.  MEDICAL HISTORY: Past Medical History:  Diagnosis Date  . Allergic rhinitis   . Arthritis   . Asthma   . Atrial fibrillation [facility])    sees Dr. [name] at [facility] Cardiology   . Atrial flutter ([place])   . BCC (basal cell carcinoma of skin)    Nose  . COPD with emphysema ([place])    sees Dr. [name]   . Depression   . Diabetes mellitus ([place])    Diet control   . Diverticulosis   . Erosive esophagitis   . Esophageal stricture   . Gastric ulcer   . Hearing loss    uses amplification  . Hemorrhoids   . Hiatal hernia   . HTN (hypertension)   . Hyperlipidemia   . 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 Sleep apnea     ALLERGIES:  has No Known Allergies.  MEDICATIONS:  Current Outpatient Medications  Medication Sig Dispense Refill  . acetaminophen (TYLENOL) 500 MG tablet Take 500-1,000 mg by mouth every 6 (six) hours as needed (for pain).    Marland Kitchen albuterol (VENTOLIN HFA) 108 (90 Base) MCG/ACT inhaler Inhale 2 puffs into the lungs every 6 (six) hours as needed for wheezing or shortness of breath. 56 g 1  . amLODipine (NORVASC) 5 MG tablet TAKE 1 TABLET DAILY 90 tablet 3  . BESIVANCE 0.6 % SUSP SMARTSIG:1 In Eye(s)    . fluticasone (FLOVENT DISKUS) 50 MCG/BLIST diskus inhaler USE 1 INHALATION TWICE A DAY (NEED APPOINTMENT FOR  REFILLS) 180 each 3  . MAGNESIUM GLUCONATE PO Take 1 tablet by mouth daily.     . Methylcellulose, Laxative, (CITRUCEL PO) Take 1 tablet by mouth daily after breakfast.     . Multiple Vitamins-Minerals (PRESERVISION AREDS 2) CAPS Take 1 capsule by mouth 2 (two) times daily.    Marland Kitchen omeprazole (PRILOSEC) 40 MG capsule TAKE 1 CAPSULE DAILY 90 capsule 3  . potassium chloride SA (KLOR-CON) 20 MEQ tablet TAKE 1 TABLET DAILY 90 tablet 3  . pravastatin (PRAVACHOL) 40 MG tablet TAKE 1 TABLET AT BEDTIME 90 tablet 3  . valsartan (DIOVAN) 160 MG tablet Take 1 tablet (160 mg total) by mouth daily. 90  tablet 3  . XARELTO 20 MG TABS tablet TAKE 1 TABLET DAILY 90 tablet 3   No current facility-administered medications for this visit.    SURGICAL HISTORY:  Past Surgical History:  Procedure Laterality Date  . CARDIAC ELECTROPHYSIOLOGY STUDY AND ABLATION     x 4 (sees Dr. [name] at [facility])  . NOSE SURGERY    . UVULOPALATOPHARYNGOPLASTY  1980's  . VASECTOMY  1103'P   w/complications    REVIEW OF SYSTEMS:   Review of Systems  Constitutional: Positive for fatigue. Negative for appetite change, chills, fatigue, fever and unexpected weight change.  HENT:   Negative for mouth sores, nosebleeds, sore throat and trouble swallowing.   Eyes: Negative for eye problems and icterus.  Respiratory: Negative for cough, hemoptysis, shortness of breath and wheezing.   Cardiovascular: Negative for chest pain and leg swelling.  Gastrointestinal: Negative for abdominal pain, constipation, diarrhea, nausea and vomiting.  Genitourinary: Negative for bladder incontinence, difficulty urinating, dysuria, frequency and hematuria.   Musculoskeletal: Negative for back pain, gait problem, neck pain and neck stiffness.  Skin: Negative for itching and rash.  Neurological: Positive for lightheadedness. Negative for dizziness, extremity weakness, gait problem, headaches, and seizures.  Hematological: Negative for adenopathy. Does not bruise/bleed easily.  Psychiatric/Behavioral: Negative for confusion, depression and sleep disturbance. The patient is not nervous/anxious.     PHYSICAL EXAMINATION:  Blood pressure (!) 153/84, pulse 87, temperature 97.9 F (36.6 C), resp. rate 13, height '5\' 10"'  (1.778 m), weight 250 lb (113.4 kg), SpO2 100 %.  ECOG PERFORMANCE STATUS: 1 - Symptomatic but completely ambulatory  Physical Exam  Constitutional: Oriented to person, place, and time and well-developed, well-nourished, and in no distress.  HENT:  Head: Normocephalic and atraumatic.  Mouth/Throat:  Oropharynx is clear and moist. No oropharyngeal exudate.  Eyes: Conjunctivae are normal. Right eye exhibits no discharge. Left eye exhibits no discharge. No scleral icterus.  Neck: Normal range of motion. Neck supple.  Cardiovascular: Normal rate, regular rhythm, normal heart sounds and intact distal pulses.   Pulmonary/Chest: Effort normal and breath sounds normal. No respiratory distress. No wheezes. No rales.  Abdominal: Soft. Bowel sounds are normal. Exhibits no distension and no mass. There is no tenderness.  Musculoskeletal: Normal range of motion. Exhibits no edema.  Lymphadenopathy:    No cervical adenopathy.  Neurological: Alert and oriented to person, place, and time. Exhibits normal muscle tone. Gait normal. Coordination normal.  Skin: Skin is warm and dry. No rash noted. Not diaphoretic. No erythema. No pallor.  Psychiatric: Mood, memory and judgment normal.  Vitals reviewed.  LABORATORY DATA: Lab Results  Component Value Date   WBC 2.6 (L) 08/06/2020   HGB 14.0 08/06/2020   HCT 42.6 08/06/2020   MCV 88.6 08/06/2020   PLT 97 (L) 08/06/2020  Chemistry      Component Value Date/Time   NA 141 02/26/2020 1329   NA 141 06/27/2015 0000   K 4.1 02/26/2020 1329   CL 106 02/26/2020 1329   CO2 25 02/26/2020 1329   BUN 22 02/26/2020 1329   BUN 21 06/27/2015 0000   CREATININE 0.94 02/26/2020 1329   GLU 121 06/27/2015 0000      Component Value Date/Time   CALCIUM 9.0 02/26/2020 1329   ALKPHOS 44 02/26/2020 1329   AST 19 02/26/2020 1329   ALT 17 02/26/2020 1329   BILITOT 0.7 02/26/2020 1329       RADIOGRAPHIC STUDIES:  No results found.   ASSESSMENT/PLAN:  This is a very pleasant 86 year old Caucasian male with intermittent periods of mild leukocytopenia, thrombocytopenia, and anemia  The patient previously had had lab work with a CBC, CMP, LDH, hepatitis, HIV, folate, B12, ferritin, and iron studies performed.  He also had autoimmune work-up in the past.  His  lab studies were unremarkable except for B12 deficiency for which he is currently receiving monthly B12 injections.   The patient was seen with Dr. Julien Nordmann.  The patient had a repeat CBC performed today which showed a white blood cell count of 2.6 and platelet count of 97k.  Dr. Julien Nordmann had a lengthy discussion with the patient about his current condition and recommended further work-up. Since the patient has bicytopenia, Dr. Julien Nordmann recommends that the patient undergo a bone marrow biopsy and aspirate to rule out a bone marrow pathology causing his condition.   I will arrange for the bone marrow biopsy and aspirate to be performed in the next few weeks. We will see the patient back for a follow up visit about 1 week later to go over the results.   The patient was advised to call immediately if he has any concerning symptoms in the interval. The patient voices understanding of current disease status and treatment options and is in agreement with the current care plan. All questions were answered. The patient knows to call the clinic with any problems, questions or concerns. We can certainly see the patient much sooner if necessary   Orders Placed This Encounter  Procedures  . CT BONE MARROW BIOPSY & ASPIRATION    Standing Status:   Future    Standing Expiration Date:   08/06/2021    Order Specific Question:   Reason for Exam (SYMPTOM  OR DIAGNOSIS REQUIRED)    Answer:   Bicytopenia (thrombocytopenia and leukocytopenia)    Order Specific Question:   Preferred imaging location?    Answer:   [facility]    Order Specific Question:   Radiology Contrast Protocol - do NOT remove file path    Answer:   [email or web address]  . CBC with Differential ([place] Only)    Standing Status:   Future    Standing Expiration Date:   08/06/2021     Tobe Sos Pansy Ostrovsky, PA-C 08/06/20  ADDENDUM: Hematology/Oncology Attending: I had a face-to-face  encounter with the patient today.  I recommended his care plan.  This is a very pleasant 86 years old white male with bicytopenia including leukocytopenia and thrombocytopenia.  The patient had extensive work-up for his conditions that were unremarkable except for mild vitamin B12 deficiency and he is currently on vitamin B12 injections.  The patient had repeat CBC performed earlier today that showed no significant improvement in his condition with the current treatment with vitamin B12. I had a lengthy discussion with the patient  and his wife today about his current condition and treatment options. I recommended for the patient to consider a bone marrow biopsy and aspirate to rule out any underlying bone marrow abnormality especially with the bicytopenia. The patient and his wife agreed to the current plan. We will arrange for him to come back for follow-up visit in few weeks for evaluation and more detailed discussion of the bone marrow biopsy result as well as any treatment recommendation. The patient was advised to call immediately if he has any other concerning symptoms in the interval.  Disclaimer: This note was dictated with voice recognition software. Similar sounding words can inadvertently be transcribed and may be missed upon review. Eilleen Kempf, MD 08/06/20

## 2020-08-06 ENCOUNTER — Other Ambulatory Visit: Payer: Self-pay | Admitting: Physician Assistant

## 2020-08-06 ENCOUNTER — Other Ambulatory Visit: Payer: Self-pay

## 2020-08-06 ENCOUNTER — Inpatient Hospital Stay: Payer: Medicare Other

## 2020-08-06 ENCOUNTER — Inpatient Hospital Stay: Payer: Medicare Other | Attending: Physician Assistant | Admitting: Physician Assistant

## 2020-08-06 ENCOUNTER — Encounter: Payer: Self-pay | Admitting: Physician Assistant

## 2020-08-06 VITALS — BP 153/84 | HR 87 | Temp 97.9°F | Resp 13 | Ht 70.0 in | Wt 250.0 lb

## 2020-08-06 DIAGNOSIS — Z7901 Long term (current) use of anticoagulants: Secondary | ICD-10-CM | POA: Insufficient documentation

## 2020-08-06 DIAGNOSIS — G4730 Sleep apnea, unspecified: Secondary | ICD-10-CM | POA: Insufficient documentation

## 2020-08-06 DIAGNOSIS — D649 Anemia, unspecified: Secondary | ICD-10-CM | POA: Diagnosis not present

## 2020-08-06 DIAGNOSIS — J45909 Unspecified asthma, uncomplicated: Secondary | ICD-10-CM | POA: Insufficient documentation

## 2020-08-06 DIAGNOSIS — Z950 Presence of cardiac pacemaker: Secondary | ICD-10-CM | POA: Insufficient documentation

## 2020-08-06 DIAGNOSIS — E538 Deficiency of other specified B group vitamins: Secondary | ICD-10-CM | POA: Diagnosis not present

## 2020-08-06 DIAGNOSIS — D7589 Other specified diseases of blood and blood-forming organs: Secondary | ICD-10-CM | POA: Diagnosis not present

## 2020-08-06 DIAGNOSIS — I10 Essential (primary) hypertension: Secondary | ICD-10-CM | POA: Insufficient documentation

## 2020-08-06 DIAGNOSIS — Z79899 Other long term (current) drug therapy: Secondary | ICD-10-CM | POA: Diagnosis not present

## 2020-08-06 DIAGNOSIS — E785 Hyperlipidemia, unspecified: Secondary | ICD-10-CM | POA: Insufficient documentation

## 2020-08-06 DIAGNOSIS — I4891 Unspecified atrial fibrillation: Secondary | ICD-10-CM | POA: Diagnosis not present

## 2020-08-06 DIAGNOSIS — E119 Type 2 diabetes mellitus without complications: Secondary | ICD-10-CM | POA: Insufficient documentation

## 2020-08-06 DIAGNOSIS — Z7951 Long term (current) use of inhaled steroids: Secondary | ICD-10-CM | POA: Insufficient documentation

## 2020-08-06 DIAGNOSIS — D696 Thrombocytopenia, unspecified: Secondary | ICD-10-CM

## 2020-08-06 DIAGNOSIS — Z85828 Personal history of other malignant neoplasm of skin: Secondary | ICD-10-CM | POA: Diagnosis not present

## 2020-08-06 DIAGNOSIS — D72819 Decreased white blood cell count, unspecified: Secondary | ICD-10-CM | POA: Insufficient documentation

## 2020-08-06 LAB — CBC WITH DIFFERENTIAL (CANCER CENTER ONLY)
Abs Immature Granulocytes: 0.01 10*3/uL (ref 0.00–0.07)
Basophils Absolute: 0 10*3/uL (ref 0.0–0.1)
Basophils Relative: 2 %
Eosinophils Absolute: 0.1 10*3/uL (ref 0.0–0.5)
Eosinophils Relative: 5 %
HCT: 42.6 % (ref 39.0–52.0)
Hemoglobin: 14 g/dL (ref 13.0–17.0)
Immature Granulocytes: 0 %
Lymphocytes Relative: 15 %
Lymphs Abs: 0.4 10*3/uL — ABNORMAL LOW (ref 0.7–4.0)
MCH: 29.1 pg (ref 26.0–34.0)
MCHC: 32.9 g/dL (ref 30.0–36.0)
MCV: 88.6 fL (ref 80.0–100.0)
Monocytes Absolute: 0.2 10*3/uL (ref 0.1–1.0)
Monocytes Relative: 9 %
Neutro Abs: 1.8 10*3/uL (ref 1.7–7.7)
Neutrophils Relative %: 69 %
Platelet Count: 97 10*3/uL — ABNORMAL LOW (ref 150–400)
RBC: 4.81 MIL/uL (ref 4.22–5.81)
RDW: 14.6 % (ref 11.5–15.5)
WBC Count: 2.6 10*3/uL — ABNORMAL LOW (ref 4.0–10.5)
nRBC: 0 % (ref 0.0–0.2)

## 2020-08-08 ENCOUNTER — Other Ambulatory Visit: Payer: Self-pay | Admitting: Physician Assistant

## 2020-08-08 DIAGNOSIS — D7589 Other specified diseases of blood and blood-forming organs: Secondary | ICD-10-CM

## 2020-08-09 ENCOUNTER — Telehealth: Payer: Self-pay | Admitting: Internal Medicine

## 2020-08-09 NOTE — Telephone Encounter (Signed)
Scheduled appt per 10/29 sch msg - pt is aware of appt date and time   

## 2020-08-15 DIAGNOSIS — N200 Calculus of kidney: Secondary | ICD-10-CM | POA: Diagnosis not present

## 2020-08-15 DIAGNOSIS — R310 Gross hematuria: Secondary | ICD-10-CM | POA: Diagnosis not present

## 2020-08-15 DIAGNOSIS — N281 Cyst of kidney, acquired: Secondary | ICD-10-CM | POA: Diagnosis not present

## 2020-08-15 DIAGNOSIS — N481 Balanitis: Secondary | ICD-10-CM | POA: Diagnosis not present

## 2020-08-16 ENCOUNTER — Other Ambulatory Visit: Payer: Self-pay | Admitting: Radiology

## 2020-08-16 DIAGNOSIS — H9113 Presbycusis, bilateral: Secondary | ICD-10-CM | POA: Diagnosis not present

## 2020-08-16 DIAGNOSIS — Z7901 Long term (current) use of anticoagulants: Secondary | ICD-10-CM | POA: Diagnosis not present

## 2020-08-18 DIAGNOSIS — Z95818 Presence of other cardiac implants and grafts: Secondary | ICD-10-CM | POA: Diagnosis not present

## 2020-08-19 ENCOUNTER — Other Ambulatory Visit: Payer: Self-pay

## 2020-08-19 ENCOUNTER — Other Ambulatory Visit: Payer: Self-pay | Admitting: Radiology

## 2020-08-19 ENCOUNTER — Ambulatory Visit (HOSPITAL_COMMUNITY)
Admission: RE | Admit: 2020-08-19 | Discharge: 2020-08-19 | Disposition: A | Discharge status: Home / Self Care | Payer: Medicare Other | Source: Ambulatory Visit | Attending: Family Medicine | Admitting: Family Medicine

## 2020-08-19 ENCOUNTER — Encounter (HOSPITAL_COMMUNITY): Payer: Self-pay

## 2020-08-19 ENCOUNTER — Ambulatory Visit (HOSPITAL_COMMUNITY)
Admission: RE | Admit: 2020-08-19 | Discharge: 2020-08-19 | Disposition: A | Discharge status: Home / Self Care | Payer: Medicare Other | Source: Ambulatory Visit | Attending: Physician Assistant | Admitting: Physician Assistant

## 2020-08-19 DIAGNOSIS — D72819 Decreased white blood cell count, unspecified: Secondary | ICD-10-CM | POA: Insufficient documentation

## 2020-08-19 DIAGNOSIS — D696 Thrombocytopenia, unspecified: Secondary | ICD-10-CM | POA: Diagnosis not present

## 2020-08-19 DIAGNOSIS — Z79899 Other long term (current) drug therapy: Secondary | ICD-10-CM | POA: Diagnosis not present

## 2020-08-19 DIAGNOSIS — D72810 Lymphocytopenia: Secondary | ICD-10-CM | POA: Diagnosis not present

## 2020-08-19 DIAGNOSIS — D649 Anemia, unspecified: Secondary | ICD-10-CM | POA: Diagnosis not present

## 2020-08-19 DIAGNOSIS — Z87891 Personal history of nicotine dependence: Secondary | ICD-10-CM | POA: Insufficient documentation

## 2020-08-19 DIAGNOSIS — D7589 Other specified diseases of blood and blood-forming organs: Secondary | ICD-10-CM | POA: Insufficient documentation

## 2020-08-19 DIAGNOSIS — Z7901 Long term (current) use of anticoagulants: Secondary | ICD-10-CM | POA: Diagnosis not present

## 2020-08-19 LAB — CBC WITH DIFFERENTIAL/PLATELET
Abs Immature Granulocytes: 0.01 10*3/uL (ref 0.00–0.07)
Basophils Absolute: 0.1 10*3/uL (ref 0.0–0.1)
Basophils Relative: 2 %
Eosinophils Absolute: 0.1 10*3/uL (ref 0.0–0.5)
Eosinophils Relative: 4 %
HCT: 45.8 % (ref 39.0–52.0)
Hemoglobin: 15 g/dL (ref 13.0–17.0)
Immature Granulocytes: 0 %
Lymphocytes Relative: 19 %
Lymphs Abs: 0.6 10*3/uL — ABNORMAL LOW (ref 0.7–4.0)
MCH: 29.6 pg (ref 26.0–34.0)
MCHC: 32.8 g/dL (ref 30.0–36.0)
MCV: 90.3 fL (ref 80.0–100.0)
Monocytes Absolute: 0.3 10*3/uL (ref 0.1–1.0)
Monocytes Relative: 8 %
Neutro Abs: 2.1 10*3/uL (ref 1.7–7.7)
Neutrophils Relative %: 67 %
Platelets: 111 10*3/uL — ABNORMAL LOW (ref 150–400)
RBC: 5.07 MIL/uL (ref 4.22–5.81)
RDW: 14.6 % (ref 11.5–15.5)
WBC: 3.2 10*3/uL — ABNORMAL LOW (ref 4.0–10.5)
nRBC: 0 % (ref 0.0–0.2)

## 2020-08-19 MED ORDER — FENTANYL CITRATE (PF) 100 MCG/2ML IJ SOLN
INTRAMUSCULAR | Status: AC
  Filled 2020-08-19: qty 2

## 2020-08-19 MED ORDER — MIDAZOLAM HCL 2 MG/2ML IJ SOLN
INTRAMUSCULAR | Status: AC
  Filled 2020-08-19: qty 4

## 2020-08-19 MED ORDER — SODIUM CHLORIDE 0.9 % IV SOLN: INTRAVENOUS | Status: DC

## 2020-08-19 MED ORDER — FENTANYL CITRATE (PF) 100 MCG/2ML IJ SOLN
INTRAMUSCULAR | Status: AC | PRN
Start: 2020-08-19 — End: 2020-08-19
  Administered 2020-08-19 (×2): 50 ug via INTRAVENOUS

## 2020-08-19 MED ORDER — MIDAZOLAM HCL 2 MG/2ML IJ SOLN
INTRAMUSCULAR | Status: AC | PRN
  Administered 2020-08-19 (×2): 1 mg via INTRAVENOUS

## 2020-08-19 MED ORDER — LIDOCAINE HCL (PF) 1 % IJ SOLN
INTRAMUSCULAR | Status: AC | PRN
  Administered 2020-08-19: 10 mL

## 2020-08-19 NOTE — Procedures (Signed)
Interventional Radiology Procedure Note  Procedure: CT guided aspirate and core biopsy of right iliac bone Complications: None Recommendations: - Bedrest supine x 1 hrs - Hydrocodone PRN  Pain - Follow biopsy results  Signed,  Naftula Donahue K. Tranquilino Fischler, MD   

## 2020-08-19 NOTE — Discharge Instructions (Signed)
Please call Interventional Radiology clinic [phone] with any questions or concerns.  You may remove your dressing and shower tomorrow.   Bone Marrow Aspiration and Bone Marrow Biopsy, Adult, Care After This sheet gives you information about how to care for yourself after your procedure. Your health care provider may also give you more specific instructions. If you have problems or questions, contact your health care provider. What can I expect after the procedure? After the procedure, it is common to have:  Mild pain and tenderness.  Swelling.  Bruising. Follow these instructions at home: Puncture site care   Follow instructions from your health care provider about how to take care of the puncture site. Make sure you: ? Wash your hands with soap and water before and after you change your bandage (dressing). If soap and water are not available, use hand sanitizer. ? Change your dressing as told by your health care provider.  Check your puncture site every day for signs of infection. Check for: ? More redness, swelling, or pain. ? Fluid or blood. ? Warmth. ? Pus or a bad smell. Activity  Return to your normal activities as told by your health care provider. Ask your health care provider what activities are safe for you.  Do not lift anything that is heavier than 10 lb (4.5 kg), or the limit that you are told, until your health care provider says that it is safe.  Do not drive for 24 hours if you were given a sedative during your procedure. General instructions   Take over-the-counter and prescription medicines only as told by your health care provider.  Do not take baths, swim, or use a hot tub until your health care provider approves. Ask your health care provider if you may take showers. You may only be allowed to take sponge baths.  If directed, put ice on the affected area. To do this: ? Put ice in a plastic bag. ? Place a towel between your skin and the  bag. ? Leave the ice on for 20 minutes, 2-3 times a day.  Keep all follow-up visits as told by your health care provider. This is important. Contact a health care provider if:  Your pain is not controlled with medicine.  You have a fever.  You have more redness, swelling, or pain around the puncture site.  You have fluid or blood coming from the puncture site.  Your puncture site feels warm to the touch.  You have pus or a bad smell coming from the puncture site. Summary  After the procedure, it is common to have mild pain, tenderness, swelling, and bruising.  Follow instructions from your health care provider about how to take care of the puncture site and what activities are safe for you.  Take over-the-counter and prescription medicines only as told by your health care provider.  Contact a health care provider if you have any signs of infection, such as fluid or blood coming from the puncture site. This information is not intended to replace advice given to you by your health care provider. Make sure you discuss any questions you have with your health care provider. Document Revised: 02/14/2019 Document Reviewed: 02/14/2019 Elsevier Patient Education  2020 Elsevier Inc.   Moderate Conscious Sedation, Adult, Care After These instructions provide you with information about caring for yourself after your procedure. Your health care provider may also give you more specific instructions. Your treatment has been planned according to current medical practices, but problems sometimes occur. Call   your health care provider if you have any problems or questions after your procedure. What can I expect after the procedure? After your procedure, it is common:  To feel sleepy for several hours.  To feel clumsy and have poor balance for several hours.  To have poor judgment for several hours.  To vomit if you eat too soon. Follow these instructions at home: For at least 24 hours after  the procedure:   Do not: ? Participate in activities where you could fall or become injured. ? Drive. ? Use heavy machinery. ? Drink alcohol. ? Take sleeping pills or medicines that cause drowsiness. ? Make important decisions or sign legal documents. ? Take care of children on your own.  Rest. Eating and drinking  Follow the diet recommended by your health care provider.  If you vomit: ? Drink water, juice, or soup when you can drink without vomiting. ? Make sure you have little or no nausea before eating solid foods. General instructions  Have a responsible adult stay with you until you are awake and alert.  Take over-the-counter and prescription medicines only as told by your health care provider.  If you smoke, do not smoke without supervision.  Keep all follow-up visits as told by your health care provider. This is important. Contact a health care provider if:  You keep feeling nauseous or you keep vomiting.  You feel light-headed.  You develop a rash.  You have a fever. Get help right away if:  You have trouble breathing. This information is not intended to replace advice given to you by your health care provider. Make sure you discuss any questions you have with your health care provider. Document Revised: 09/10/2017 Document Reviewed: 01/18/2016 Elsevier Patient Education  2020 Elsevier Inc.  

## 2020-08-19 NOTE — Consult Note (Signed)
Chief Complaint: Patient was seen in consultation today for CT guided bone marrow biopsy  Referring Physician(s): Mohamed,M  Supervising Physician: Jacqulynn Cadet  Patient Status: [facility] - Out-pt  History of Present Illness: Guy Moore is an 86 y.o. male with history of intermittent periods of mild leukocytopenia, thrombocytopenia and anemia of uncertain etiology who presents today for CT-guided bone marrow biopsy for further evaluation.  He does have vitamin B12 deficiency as well.  Additional medical history as below.  Past Medical History:  Diagnosis Date  . Allergic rhinitis   . Arthritis   . Asthma   . Atrial fibrillation [facility])    sees Dr. [name] at [facility]   . Atrial flutter ([place])   . BCC (basal cell carcinoma of skin)    Nose  . COPD with emphysema ([place])    sees Dr. [name]   . Depression   . Diabetes mellitus ([place])    Diet control   . Diverticulosis   . Erosive esophagitis   . Esophageal stricture   . Gastric ulcer   . Hearing loss    uses amplification  . Hemorrhoids   . Hiatal hernia   . HTN (hypertension)   . Hyperlipidemia   . 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 Sleep apnea     Past Surgical History:  Procedure Laterality Date  . CARDIAC ELECTROPHYSIOLOGY STUDY AND ABLATION     x 4 (sees Dr. [name] at [facility])  . NOSE SURGERY    . UVULOPALATOPHARYNGOPLASTY  1980's  . VASECTOMY  1610'R   w/complications    Allergies: Patient has no known allergies.  Medications: Prior to Admission medications   Medication Sig Start Date End Date Taking? Authorizing Provider  acetaminophen (TYLENOL) 500 MG tablet Take 500-1,000 mg by mouth every 6 (six) hours as needed (for pain).   Yes [provider]  albuterol (VENTOLIN HFA) 108 (90 Base) MCG/ACT inhaler Inhale 2 puffs into the lungs every 6 (six) hours as needed for wheezing or shortness of breath. 11/20/19  Yes [name],  MD  amLODipine (NORVASC) 5 MG tablet TAKE 1 TABLET DAILY 07/08/20  Yes Laurey Morale, MD  fluticasone (FLOVENT DISKUS) 50 MCG/BLIST diskus inhaler USE 1 INHALATION TWICE A DAY (NEED APPOINTMENT FOR REFILLS) 09/15/19  Yes [name], MD  MAGNESIUM GLUCONATE PO Take 1 tablet by mouth daily.    Yes [provider]  Methylcellulose, Laxative, (CITRUCEL PO) Take 1 tablet by mouth daily after breakfast.    Yes [provider]  Multiple Vitamins-Minerals (PRESERVISION AREDS 2) CAPS Take 1 capsule by mouth 2 (two) times daily.   Yes [provider]  omeprazole (PRILOSEC) 40 MG capsule TAKE 1 CAPSULE DAILY 06/28/19  Yes Laurey Morale, MD  potassium chloride SA (KLOR-CON) 20 MEQ tablet TAKE 1 TABLET DAILY 07/08/20  Yes Laurey Morale, MD  pravastatin (PRAVACHOL) 40 MG tablet TAKE 1 TABLET AT BEDTIME 07/28/19  Yes Laurey Morale, MD  valsartan (DIOVAN) 160 MG tablet Take 1 tablet (160 mg total) by mouth daily. 11/07/19  Yes Laurey Morale, MD  XARELTO 20 MG TABS tablet TAKE 1 TABLET DAILY 03/04/20  Yes Laurey Morale, MD  BESIVANCE 0.6 % SUSP SMARTSIG:1 In Eye(s) 07/24/20   [provider]     Family History  Problem Relation Age of Onset  . Stroke Mother   . Breast cancer Mother   . Heart disease Father   .  Hypertension Father   . Cancer Father        Bladder  . Breast cancer Brother   . Atrial fibrillation Brother   . Tremor Brother   . Stroke Brother   . Colon cancer Neg Hx     Social History   Socioeconomic History  . Marital status: Married    Spouse name: Not on file  . Number of children: Not on file  . Years of education: Not on file  . Highest education level: Not on file  Occupational History  . Occupation: Retired at 67Print production planner  . Occupation: 32 years [employer]  . Occupation: Med Environmental education officer  . 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 Smoking status: Former Smoker    Packs/day: 1.00    Years: 28.00     Pack years: 28.00    Types: Cigarettes    Quit date: 10/12/1977    Years since quitting: 42.8  . Smokeless tobacco: Never Used  Substance and Sexual Activity  . Alcohol use: No    Alcohol/week: 0.0 standard drinks  . Drug use: No  . Sexual activity: Not on file  Other Topics Concern  . Not on file  Social History Narrative   END OF LIFE CARE: he does not want CPR, short term mechanical ventilation. He does not want heroic or futile care especially if he cannon communicate, read and enjoy his loved ones.     Social Determinants of Health   Financial Resource Strain:   . Difficulty of Paying Living Expenses: Not on file  Food Insecurity:   . Worried About Charity fundraiser in the Last Year: Not on file  . Ran Out of Food in the Last Year: Not on file  Transportation Needs:   . Lack of Transportation (Medical): Not on file  . Lack of Transportation (Non-Medical): Not on file  Physical Activity:   . Days of Exercise per Week: Not on file  . Minutes of Exercise per Session: Not on file  Stress:   . Feeling of Stress : Not on file  Social Connections:   . Frequency of Communication with Friends and Family: Not on file  . Frequency of Social Gatherings with Friends and Family: Not on file  . Attends Religious Services: Not on file  . Active Member of Clubs or Organizations: Not on file  . Attends Archivist Meetings: Not on file  . Marital Status: Not on file      Review of Systems currently denies fever, headache, chest pain, dyspnea, cough, abdominal/back pain, nausea, vomiting or bleeding.  Vital Signs: BP (!) 152/93   Pulse 80   Temp 98.3 F (36.8 C) (Oral)   Resp 15   SpO2 97%   Physical Exam awake, alert.  Chest clear to auscultation bilaterally.  Heart with regular rate /rhythm.  Abdomen soft, positive bowel sounds, nontender.  Some trace pretibial edema bilaterally.  Imaging: No results found.  Labs:  CBC: Recent Labs    02/21/20 1006  02/26/20 1329 06/26/20 0851 08/06/20 1052  WBC 3.1* 4.1 2.8* 2.6*  HGB 14.0 14.0 14.4 14.0  HCT 41.5 42.6 42.7 42.6  PLT 113.0* 115* 114* 97*    COAGS: No results for input(s): INR, APTT in the last 8760 hours.  BMP: Recent Labs    02/20/20 1049 02/26/20 1329  NA 139 141  K 4.2 4.1  CL 103 106  CO2 30 25  GLUCOSE 124* 103*  BUN 20  22  CALCIUM 8.8 9.0  CREATININE 0.94 0.94  GFRNONAA  --  >60  GFRAA  --  >60    LIVER FUNCTION TESTS: Recent Labs    02/20/20 1049 02/26/20 1329  BILITOT 0.8 0.7  AST 16 19  ALT 14 17  ALKPHOS 40 44  PROT 6.3 6.6  ALBUMIN 4.3 3.8    TUMOR MARKERS: No results for input(s): AFPTM, CEA, CA199, CHROMGRNA in the last 8760 hours.  Assessment and Plan:  86 y.o. male with history of intermittent periods of mild leukocytopenia, thrombocytopenia and anemia of uncertain etiology who presents today for CT-guided bone marrow biopsy for further evaluation. Risks and benefits of procedure was discussed with the patient  including, but not limited to bleeding, infection, damage to adjacent structures or low yield requiring additional tests.  All of the questions were answered and there is agreement to proceed.  Consent signed and in chart.     Thank you for this interesting consult.  I greatly enjoyed meeting Guy Moore and look forward to participating in their care.  A copy of this report was sent to the requesting provider on this date.  Electronically Signed: D. Rowe Robert, PA-C 08/19/2020, 8:17 AM   I spent a total of 20 minutes   in face to face in clinical consultation, greater than 50% of which was counseling/coordinating care for CT-guided bone marrow biopsy

## 2020-08-20 ENCOUNTER — Encounter (INDEPENDENT_AMBULATORY_CARE_PROVIDER_SITE_OTHER): Payer: Medicare Other | Admitting: Ophthalmology

## 2020-08-20 DIAGNOSIS — H43813 Vitreous degeneration, bilateral: Secondary | ICD-10-CM

## 2020-08-20 DIAGNOSIS — H353231 Exudative age-related macular degeneration, bilateral, with active choroidal neovascularization: Secondary | ICD-10-CM

## 2020-08-20 DIAGNOSIS — H35033 Hypertensive retinopathy, bilateral: Secondary | ICD-10-CM

## 2020-08-20 DIAGNOSIS — D3131 Benign neoplasm of right choroid: Secondary | ICD-10-CM

## 2020-08-20 DIAGNOSIS — I10 Essential (primary) hypertension: Secondary | ICD-10-CM

## 2020-08-26 ENCOUNTER — Inpatient Hospital Stay: Payer: Medicare Other

## 2020-08-26 ENCOUNTER — Ambulatory Visit: Payer: Medicare Other

## 2020-08-26 ENCOUNTER — Encounter: Payer: Self-pay | Admitting: Internal Medicine

## 2020-08-26 ENCOUNTER — Other Ambulatory Visit: Payer: Self-pay

## 2020-08-26 ENCOUNTER — Inpatient Hospital Stay: Payer: Medicare Other | Attending: Physician Assistant | Admitting: Internal Medicine

## 2020-08-26 ENCOUNTER — Encounter (HOSPITAL_COMMUNITY): Payer: Self-pay | Admitting: Physician Assistant

## 2020-08-26 VITALS — BP 154/85 | HR 86 | Temp 97.7°F | Resp 18 | Ht 70.0 in | Wt 250.3 lb

## 2020-08-26 DIAGNOSIS — Z7951 Long term (current) use of inhaled steroids: Secondary | ICD-10-CM | POA: Insufficient documentation

## 2020-08-26 DIAGNOSIS — Z7901 Long term (current) use of anticoagulants: Secondary | ICD-10-CM | POA: Insufficient documentation

## 2020-08-26 DIAGNOSIS — E538 Deficiency of other specified B group vitamins: Secondary | ICD-10-CM | POA: Diagnosis not present

## 2020-08-26 DIAGNOSIS — D696 Thrombocytopenia, unspecified: Secondary | ICD-10-CM | POA: Diagnosis not present

## 2020-08-26 DIAGNOSIS — G4730 Sleep apnea, unspecified: Secondary | ICD-10-CM | POA: Insufficient documentation

## 2020-08-26 DIAGNOSIS — D72819 Decreased white blood cell count, unspecified: Secondary | ICD-10-CM | POA: Insufficient documentation

## 2020-08-26 DIAGNOSIS — I10 Essential (primary) hypertension: Secondary | ICD-10-CM | POA: Diagnosis not present

## 2020-08-26 DIAGNOSIS — D7589 Other specified diseases of blood and blood-forming organs: Secondary | ICD-10-CM

## 2020-08-26 DIAGNOSIS — Z85828 Personal history of other malignant neoplasm of skin: Secondary | ICD-10-CM | POA: Diagnosis not present

## 2020-08-26 DIAGNOSIS — I4891 Unspecified atrial fibrillation: Secondary | ICD-10-CM | POA: Insufficient documentation

## 2020-08-26 DIAGNOSIS — E785 Hyperlipidemia, unspecified: Secondary | ICD-10-CM | POA: Insufficient documentation

## 2020-08-26 DIAGNOSIS — Z79899 Other long term (current) drug therapy: Secondary | ICD-10-CM | POA: Diagnosis not present

## 2020-08-26 DIAGNOSIS — E119 Type 2 diabetes mellitus without complications: Secondary | ICD-10-CM | POA: Diagnosis not present

## 2020-08-26 DIAGNOSIS — J45909 Unspecified asthma, uncomplicated: Secondary | ICD-10-CM | POA: Insufficient documentation

## 2020-08-26 LAB — VITAMIN B12: Vitamin B-12: 415 pg/mL (ref 180–914)

## 2020-08-26 NOTE — Progress Notes (Signed)
[facility] [phone]   [phone]  OFFICE PROGRESS NOTE  [place], [place] [id number]  DIAGNOSIS: Intermittent Bicytopenia  PRIOR THERAPY: None  CURRENT THERAPY: Observation  INTERVAL HISTORY: Guy Moore 86 y.o. male returns to the clinic today for follow-up visit accompanied by his wife.  The patient is feeling fine today with no concerning complaints.  He denied having any chest pain, shortness of breath, cough or hemoptysis.  He denied having any fatigue or weakness.  He has no nausea, vomiting, diarrhea or constipation.  He denied having any headache or visual changes.  He had CT-guided bone marrow biopsy and aspirate performed recently and is here for evaluation and discussion of his biopsy results and recommendation regarding his condition.  MEDICAL HISTORY: Past Medical History:  Diagnosis Date  . Allergic rhinitis   . Arthritis   . Asthma   . Atrial fibrillation [facility])    sees Dr. [name] at [facility]   . Atrial flutter ([place])   . BCC (basal cell carcinoma of skin)    Nose  . COPD with emphysema ([place])    sees Dr. [name]   . Depression   . Diabetes mellitus ([place])    Diet control   . Diverticulosis   . Erosive esophagitis   . Esophageal stricture   . Gastric ulcer   . Hearing loss    uses amplification  . Hemorrhoids   . Hiatal hernia   . HTN (hypertension)   . Hyperlipidemia   . 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 Sleep apnea     ALLERGIES:  has No Known Allergies.  MEDICATIONS:  Current Outpatient Medications  Medication Sig Dispense Refill  . acetaminophen (TYLENOL) 500 MG tablet Take 500-1,000 mg by mouth every 6 (six) hours as needed (for pain).    Marland Kitchen albuterol (VENTOLIN HFA) 108 (90 Base) MCG/ACT inhaler Inhale 2 puffs into the lungs every 6 (six) hours as needed for wheezing or shortness of breath. 56 g 1  . amLODipine  (NORVASC) 5 MG tablet TAKE 1 TABLET DAILY 90 tablet 3  . BESIVANCE 0.6 % SUSP SMARTSIG:1 In Eye(s)    . fluticasone (FLOVENT DISKUS) 50 MCG/BLIST diskus inhaler USE 1 INHALATION TWICE A DAY (NEED APPOINTMENT FOR REFILLS) 180 each 3  . MAGNESIUM GLUCONATE PO Take 1 tablet by mouth daily.     . Methylcellulose, Laxative, (CITRUCEL PO) Take 1 tablet by mouth daily after breakfast.     . Multiple Vitamins-Minerals (PRESERVISION AREDS 2) CAPS Take 1 capsule by mouth 2 (two) times daily.    Marland Kitchen omeprazole (PRILOSEC) 40 MG capsule TAKE 1 CAPSULE DAILY 90 capsule 3  . potassium chloride SA (KLOR-CON) 20 MEQ tablet TAKE 1 TABLET DAILY 90 tablet 3  . pravastatin (PRAVACHOL) 40 MG tablet TAKE 1 TABLET AT BEDTIME 90 tablet 3  . valsartan (DIOVAN) 160 MG tablet Take 1 tablet (160 mg total) by mouth daily. 90 tablet 3  . XARELTO 20 MG TABS tablet TAKE 1 TABLET DAILY 90 tablet 3   No current facility-administered medications for this visit.    SURGICAL HISTORY:  Past Surgical History:  Procedure Laterality Date  . CARDIAC ELECTROPHYSIOLOGY STUDY AND ABLATION     x 4 (sees Dr. [name] at [facility])  . NOSE SURGERY    . UVULOPALATOPHARYNGOPLASTY  1980's  . VASECTOMY  7614'J   w/complications  REVIEW OF SYSTEMS:  A comprehensive review of systems was negative.   PHYSICAL EXAMINATION: General appearance: alert, cooperative and no distress Head: Normocephalic, without obvious abnormality, atraumatic Neck: no adenopathy, no JVD, supple, symmetrical, trachea midline and thyroid not enlarged, symmetric, no tenderness/mass/nodules Lymph nodes: Cervical, supraclavicular, and axillary nodes normal. Resp: clear to auscultation bilaterally Back: symmetric, no curvature. ROM normal. No CVA tenderness. Cardio: regular rate and rhythm, S1, S2 normal, no murmur, click, rub or gallop GI: soft, non-tender; bowel sounds normal; no masses,  no organomegaly Extremities: extremities normal, atraumatic, no  cyanosis or edema  ECOG PERFORMANCE STATUS: 1 - Symptomatic but completely ambulatory  Blood pressure (!) 154/85, pulse 86, temperature 97.7 F (36.5 C), temperature source Tympanic, resp. rate 18, height '5\' 10"'  (1.778 m), weight 250 lb 4.8 oz (113.5 kg), SpO2 98 %.  LABORATORY DATA: Lab Results  Component Value Date   WBC 3.2 (L) 2020/08/23   HGB 15.0 Aug 23, 2020   HCT 45.8 08/23/2020   MCV 90.3 August 23, 2020   PLT 111 (L) August 23, 2020      Chemistry      Component Value Date/Time   NA 141 02/26/2020 1329   NA 141 06/27/2015 0000   K 4.1 02/26/2020 1329   CL 106 02/26/2020 1329   CO2 25 02/26/2020 1329   BUN 22 02/26/2020 1329   BUN 21 06/27/2015 0000   CREATININE 0.94 02/26/2020 1329   GLU 121 06/27/2015 0000      Component Value Date/Time   CALCIUM 9.0 02/26/2020 1329   ALKPHOS 44 02/26/2020 1329   AST 19 02/26/2020 1329   ALT 17 02/26/2020 1329   BILITOT 0.7 02/26/2020 1329       RADIOGRAPHIC STUDIES: CT BIOPSY  Result Date: 23-Aug-2020 INDICATION: 86 year old male with thrombocytopenia and leukocytopenia. He presents for CT-guided bone marrow biopsy. EXAM: CT GUIDED BONE MARROW ASPIRATION AND CORE BIOPSY Interventional Radiologist:  [name], MD MEDICATIONS: None. ANESTHESIA/SEDATION: Moderate (conscious) sedation was employed during this procedure. A total of 2 milligrams versed and 100 micrograms fentanyl were administered intravenously. The patient's level of consciousness and vital signs were monitored continuously by radiology nursing throughout the procedure under my direct supervision. Total monitored sedation time: 10 minutes FLUOROSCOPY TIME:  None. COMPLICATIONS: None immediate. Estimated blood loss: <25 mL PROCEDURE: Informed written consent was obtained from the patient after a thorough discussion of the procedural risks, benefits and alternatives. All questions were addressed. Maximal Sterile Barrier Technique was utilized including caps, mask, sterile  gowns, sterile gloves, sterile drape, hand hygiene and skin antiseptic. A timeout was performed prior to the initiation of the procedure. The patient was positioned prone and non-contrast localization CT was performed of the pelvis to demonstrate the iliac marrow spaces. Maximal barrier sterile technique utilized including caps, mask, sterile gowns, sterile gloves, large sterile drape, hand hygiene, and betadine prep. Under sterile conditions and local anesthesia, an 11 gauge coaxial bone biopsy needle was advanced into the right iliac marrow space. Needle position was confirmed with CT imaging. Initially, bone marrow aspiration was performed. Next, the 11 gauge outer cannula was utilized to obtain a right iliac bone marrow core biopsy. Needle was removed. Hemostasis was obtained with compression. The patient tolerated the procedure well. Samples were prepared with the cytotechnologist. IMPRESSION: Technically successful CT-guided bone marrow aspiration and core biopsy of the right iliac bone. Electronically Signed   By: Jacqulynn Cadet M.D.   On: 08/23/20 10:24   CT BONE MARROW BIOPSY & ASPIRATION  Result Date: 2020/08/23  INDICATION: 86 year old male with thrombocytopenia and leukocytopenia. He presents for CT-guided bone marrow biopsy. EXAM: CT GUIDED BONE MARROW ASPIRATION AND CORE BIOPSY Interventional Radiologist:  [name], MD MEDICATIONS: None. ANESTHESIA/SEDATION: Moderate (conscious) sedation was employed during this procedure. A total of 2 milligrams versed and 100 micrograms fentanyl were administered intravenously. The patient's level of consciousness and vital signs were monitored continuously by radiology nursing throughout the procedure under my direct supervision. Total monitored sedation time: 10 minutes FLUOROSCOPY TIME:  None. COMPLICATIONS: None immediate. Estimated blood loss: <25 mL PROCEDURE: Informed written consent was obtained from the patient after a thorough discussion  of the procedural risks, benefits and alternatives. All questions were addressed. Maximal Sterile Barrier Technique was utilized including caps, mask, sterile gowns, sterile gloves, sterile drape, hand hygiene and skin antiseptic. A timeout was performed prior to the initiation of the procedure. The patient was positioned prone and non-contrast localization CT was performed of the pelvis to demonstrate the iliac marrow spaces. Maximal barrier sterile technique utilized including caps, mask, sterile gowns, sterile gloves, large sterile drape, hand hygiene, and betadine prep. Under sterile conditions and local anesthesia, an 11 gauge coaxial bone biopsy needle was advanced into the right iliac marrow space. Needle position was confirmed with CT imaging. Initially, bone marrow aspiration was performed. Next, the 11 gauge outer cannula was utilized to obtain a right iliac bone marrow core biopsy. Needle was removed. Hemostasis was obtained with compression. The patient tolerated the procedure well. Samples were prepared with the cytotechnologist. IMPRESSION: Technically successful CT-guided bone marrow aspiration and core biopsy of the right iliac bone. Electronically Signed   By: Jacqulynn Cadet M.D.   On: 08/19/2020 10:24    ASSESSMENT AND PLAN: This is a very pleasant 86 years old white male with bicytopenia including leukocytopenia and thrombocytopenia likely secondary to iron deficiency but early MDS could not be completely excluded. The patient had a bone marrow biopsy and aspirate performed recently that was nonspecific but again could not rule out early myelodysplastic syndrome. I recommended for the patient to continue on observation with close monitoring. I will see him back for follow-up visit in 6 months for evaluation and repeat CBC, comprehensive metabolic panel, LDH, vitamin B12 level as well as serum folate. The patient will continue with the vitamin B12 injection by his primary care physician  at this point. He was advised to call immediately if he has any other concerning symptoms in the interval. The patient voices understanding of current disease status and treatment options and is in agreement with the current care plan.  All questions were answered. The patient knows to call the clinic with any problems, questions or concerns. We can certainly see the patient much sooner if necessary.  Disclaimer: This note was dictated with voice recognition software. Similar sounding words can inadvertently be transcribed and may not be corrected upon review.

## 2020-08-27 ENCOUNTER — Encounter: Payer: Self-pay | Admitting: Family Medicine

## 2020-08-27 ENCOUNTER — Telehealth: Payer: Self-pay | Admitting: Internal Medicine

## 2020-08-27 LAB — SURGICAL PATHOLOGY

## 2020-08-27 NOTE — Telephone Encounter (Signed)
Scheduled per los. Called and left msg. Mailed printout  °

## 2020-08-28 NOTE — Telephone Encounter (Signed)
Yes we will stay on monthly shots from now on

## 2020-08-30 ENCOUNTER — Encounter (INDEPENDENT_AMBULATORY_CARE_PROVIDER_SITE_OTHER): Payer: Medicare Other | Admitting: Ophthalmology

## 2020-09-02 ENCOUNTER — Other Ambulatory Visit: Payer: Self-pay

## 2020-09-02 ENCOUNTER — Ambulatory Visit (INDEPENDENT_AMBULATORY_CARE_PROVIDER_SITE_OTHER): Payer: Medicare Other | Admitting: *Deleted

## 2020-09-02 DIAGNOSIS — E538 Deficiency of other specified B group vitamins: Secondary | ICD-10-CM

## 2020-09-02 MED ORDER — CYANOCOBALAMIN 1000 MCG/ML IJ SOLN
1000.0000 ug | Freq: Once | INTRAMUSCULAR | Status: AC
  Administered 2020-09-02: 1000 ug via INTRAMUSCULAR

## 2020-09-02 NOTE — Progress Notes (Signed)
Per orders of Dr. Fry, injection of B12 given by Latima Hamza. Patient tolerated injection well. 

## 2020-09-05 ENCOUNTER — Other Ambulatory Visit: Payer: Self-pay | Admitting: Pulmonary Disease

## 2020-09-05 ENCOUNTER — Other Ambulatory Visit: Payer: Self-pay | Admitting: Family Medicine

## 2020-09-16 ENCOUNTER — Encounter (INDEPENDENT_AMBULATORY_CARE_PROVIDER_SITE_OTHER): Payer: Medicare Other | Admitting: Ophthalmology

## 2020-09-16 ENCOUNTER — Other Ambulatory Visit: Payer: Self-pay

## 2020-09-16 DIAGNOSIS — H43813 Vitreous degeneration, bilateral: Secondary | ICD-10-CM | POA: Diagnosis not present

## 2020-09-16 DIAGNOSIS — I10 Essential (primary) hypertension: Secondary | ICD-10-CM

## 2020-09-16 DIAGNOSIS — H353231 Exudative age-related macular degeneration, bilateral, with active choroidal neovascularization: Secondary | ICD-10-CM

## 2020-09-16 DIAGNOSIS — D3131 Benign neoplasm of right choroid: Secondary | ICD-10-CM

## 2020-09-16 DIAGNOSIS — H35033 Hypertensive retinopathy, bilateral: Secondary | ICD-10-CM

## 2020-09-17 ENCOUNTER — Other Ambulatory Visit: Payer: Self-pay | Admitting: Family Medicine

## 2020-09-18 ENCOUNTER — Other Ambulatory Visit: Payer: Self-pay | Admitting: Pulmonary Disease

## 2020-09-18 NOTE — Telephone Encounter (Signed)
Pt is wanting to have a prescription filled without having to come in office for a visit . He was seen last on 06-12-20 by Volanda Napoleon .  fluticasone (FLOVENT DISKUS) 50 MCG/BLIST diskus inhaler [[id number]]   to be sent to [place], [place]. Please advise .CB# [phone].

## 2020-09-24 DIAGNOSIS — Z950 Presence of cardiac pacemaker: Secondary | ICD-10-CM | POA: Diagnosis not present

## 2020-09-24 DIAGNOSIS — I4891 Unspecified atrial fibrillation: Secondary | ICD-10-CM | POA: Diagnosis not present

## 2020-09-26 ENCOUNTER — Encounter: Payer: Self-pay | Admitting: Family Medicine

## 2020-10-01 ENCOUNTER — Encounter: Payer: Self-pay | Admitting: Internal Medicine

## 2020-10-01 NOTE — Telephone Encounter (Signed)
I totally agree. If his Cardiologist and Dr. Julien Nordmann want to pursue this, I will be happy to refer him to Cardiovascular Surgery to discuss it

## 2020-10-02 ENCOUNTER — Encounter: Payer: Self-pay | Admitting: Physician Assistant

## 2020-10-02 NOTE — Telephone Encounter (Signed)
-----   Message from Curt Bears, MD sent at 10/01/2020  4:34 PM EST ----- Regarding: RE: medication management I do not see any contraindication from our side to do this procedure.  It is up to them.  Thank you. ----- Message ----- From: Ardeen Garland, RN Sent: 10/01/2020   2:25 PM EST To: Curt Bears, MD, # Subject: medication management                            "Julien Girt "Sonia Side"  P Onc Nurse Cc During recent follow up with cardiologist my low platelets was discussed . They said I should talk with you about a watchman implant so that I would no longer need Xeralto. They would support it if you thought it might be beneficial. No big rush. It would of course eliminate some of the other risks with long term blood thinners. Dr [name] would do the implant. He has done all my ablations. This would be a simple procedure for me  Thank you  Kirk Basquez "  Sanay Belmar , this is FYI for you.

## 2020-10-02 NOTE — Telephone Encounter (Signed)
I spoke with pt and advised as indicated. Pt expressed understanding of this information. 

## 2020-10-03 ENCOUNTER — Ambulatory Visit (INDEPENDENT_AMBULATORY_CARE_PROVIDER_SITE_OTHER): Payer: Medicare Other | Admitting: *Deleted

## 2020-10-03 ENCOUNTER — Other Ambulatory Visit: Payer: Self-pay

## 2020-10-03 DIAGNOSIS — E538 Deficiency of other specified B group vitamins: Secondary | ICD-10-CM | POA: Diagnosis not present

## 2020-10-03 MED ORDER — CYANOCOBALAMIN 1000 MCG/ML IJ SOLN
1000.0000 ug | Freq: Once | INTRAMUSCULAR | Status: AC
  Administered 2020-10-03: 1000 ug via INTRAMUSCULAR

## 2020-10-03 NOTE — Progress Notes (Signed)
Per orders of Dr. Fry, injection of Cyanocobalamin 1000mcg given by Garison Genova A. Patient tolerated injection well.  

## 2020-10-07 ENCOUNTER — Telehealth: Payer: Self-pay | Admitting: Medical Oncology

## 2020-10-07 NOTE — Telephone Encounter (Signed)
-----   Message from Si Gaul, MD sent at 10/01/2020  4:34 PM EST ----- Regarding: RE: medication management I do not see any contraindication from our side to do this procedure.  It is up to them.  Thank you. ----- Message ----- From: Charma Igo, RN Sent: 10/01/2020   2:25 PM EST To: Si Gaul, MD, # Subject: medication management                            "Guy Moore "Guy Moore"  P Onc Nurse Cc During recent follow up with cardiologist my low platelets was discussed . They said I should talk with you about a watchman implant so that I would no longer need Xeralto. They would support it if you thought it might be beneficial. No big rush. It would of course eliminate some of the other risks with long term blood thinners. Dr Bedelia Person would do the implant. He has done all my ablations. This would be a simple procedure for me  Thank you  Guy Moore "  Ansyi , this is FYI for you.

## 2020-10-07 NOTE — Telephone Encounter (Signed)
Disregard call -Cassie talked to pt and told him there are no contraindications from [facility] re  procedure ( watchman implant)

## 2020-10-15 ENCOUNTER — Encounter (INDEPENDENT_AMBULATORY_CARE_PROVIDER_SITE_OTHER): Payer: Medicare Other | Admitting: Ophthalmology

## 2020-10-15 ENCOUNTER — Other Ambulatory Visit: Payer: Self-pay

## 2020-10-15 DIAGNOSIS — H353231 Exudative age-related macular degeneration, bilateral, with active choroidal neovascularization: Secondary | ICD-10-CM

## 2020-10-15 DIAGNOSIS — I10 Essential (primary) hypertension: Secondary | ICD-10-CM

## 2020-10-15 DIAGNOSIS — H35033 Hypertensive retinopathy, bilateral: Secondary | ICD-10-CM | POA: Diagnosis not present

## 2020-10-15 DIAGNOSIS — D3131 Benign neoplasm of right choroid: Secondary | ICD-10-CM

## 2020-10-15 DIAGNOSIS — H43813 Vitreous degeneration, bilateral: Secondary | ICD-10-CM

## 2020-10-24 ENCOUNTER — Telehealth: Payer: Self-pay | Admitting: Pulmonary Disease

## 2020-10-24 MED ORDER — FLOVENT DISKUS 50 MCG/BLIST IN AEPB
1.0000 | INHALATION_SPRAY | Freq: Two times a day (BID) | RESPIRATORY_TRACT | 3 refills | Status: DC
Start: 2020-10-24 — End: 2021-02-28

## 2020-10-24 NOTE — Telephone Encounter (Signed)
Rx for pt's Flovent inhaler has been sent to preferred pharmacy for pt. Attempted to call pt to let him know this had been done but unable to reach. Left pt a detailed message letting him know this had been done. Nothing further needed.

## 2020-10-24 NOTE — Telephone Encounter (Signed)
Called and spoke with patient to let him know that Raquel Sarna has sent in his prescription for 90 days to [facility]. Patient expressed understanding. Nothing further needed at this time.   Pinion, Waldemar Dickens, [facility]     10/24/20 2:48 PM Note Rx for pt's Flovent inhaler has been sent to preferred pharmacy for pt. Attempted to call pt to let him know this had been done but unable to reach. Left pt a detailed message letting him know this had been done. Nothing further needed.

## 2020-10-30 ENCOUNTER — Other Ambulatory Visit: Payer: Self-pay

## 2020-10-31 ENCOUNTER — Other Ambulatory Visit: Payer: Self-pay

## 2020-10-31 ENCOUNTER — Ambulatory Visit (INDEPENDENT_AMBULATORY_CARE_PROVIDER_SITE_OTHER): Payer: Medicare Other

## 2020-10-31 DIAGNOSIS — E538 Deficiency of other specified B group vitamins: Secondary | ICD-10-CM

## 2020-10-31 MED ORDER — CYANOCOBALAMIN 1000 MCG/ML IJ SOLN
1000.0000 ug | Freq: Once | INTRAMUSCULAR | Status: AC
  Administered 2020-10-31: 1000 ug via INTRAMUSCULAR

## 2020-10-31 NOTE — Progress Notes (Addendum)
Patient seen today for B12 injection.  Patient tolerated well.

## 2020-11-11 ENCOUNTER — Encounter (INDEPENDENT_AMBULATORY_CARE_PROVIDER_SITE_OTHER): Payer: Medicare Other | Admitting: Ophthalmology

## 2020-11-11 ENCOUNTER — Other Ambulatory Visit: Payer: Self-pay

## 2020-11-11 DIAGNOSIS — I10 Essential (primary) hypertension: Secondary | ICD-10-CM | POA: Diagnosis not present

## 2020-11-11 DIAGNOSIS — D3131 Benign neoplasm of right choroid: Secondary | ICD-10-CM | POA: Diagnosis not present

## 2020-11-11 DIAGNOSIS — H35033 Hypertensive retinopathy, bilateral: Secondary | ICD-10-CM | POA: Diagnosis not present

## 2020-11-11 DIAGNOSIS — H43813 Vitreous degeneration, bilateral: Secondary | ICD-10-CM

## 2020-11-11 DIAGNOSIS — H353231 Exudative age-related macular degeneration, bilateral, with active choroidal neovascularization: Secondary | ICD-10-CM | POA: Diagnosis not present

## 2020-11-12 ENCOUNTER — Ambulatory Visit (INDEPENDENT_AMBULATORY_CARE_PROVIDER_SITE_OTHER): Payer: Medicare Other

## 2020-11-12 DIAGNOSIS — Z0000 Encounter for general adult medical examination without abnormal findings: Secondary | ICD-10-CM | POA: Diagnosis not present

## 2020-11-12 NOTE — Patient Instructions (Signed)
[name] , Thank you for taking time to come for your Medicare Wellness Visit. I appreciate your ongoing commitment to your health goals. Please review the following plan we discussed and let me know if I can assist you in the future.   Screening recommendations/referrals: Colonoscopy: No longer required  Recommended yearly ophthalmology/optometry visit for glaucoma screening and checkup Recommended yearly dental visit for hygiene and checkup  Vaccinations: Influenza vaccine: Up to date, next due fall 2022  Pneumococcal vaccine: Completed series  Tdap vaccine: Currently due, if you wish to receive you may contact your insurance company to discuss cost or you may await and injury to receive  Shingles vaccine: Currently due for Shingrix, if you wish to receive you may do so at your local pharmacy as it is less expensive     Advanced directives: Please bring in copies of your advanced medical directives so that we may scan them into your chart.  Conditions/risks identified: None   Next appointment: 12/02/2020 @ 10:45 am for a B12 injection   Preventive Care 65 Years and Older, Male Preventive care refers to lifestyle choices and visits with your health care provider that can promote health and wellness. What does preventive care include?  A yearly physical exam. This is also called an annual well check.  Dental exams once or twice a year.  Routine eye exams. Ask your health care provider how often you should have your eyes checked.  Personal lifestyle choices, including:  Daily care of your teeth and gums.  Regular physical activity.  Eating a healthy diet.  Avoiding tobacco and drug use.  Limiting alcohol use.  Practicing safe sex.  Taking low doses of aspirin every day.  Taking vitamin and mineral supplements as recommended by your health care provider. What happens during an annual well check? The services and screenings done by your health care provider during your  annual well check will depend on your age, overall health, lifestyle risk factors, and family history of disease. Counseling  Your health care provider may ask you questions about your:  Alcohol use.  Tobacco use.  Drug use.  Emotional well-being.  Home and relationship well-being.  Sexual activity.  Eating habits.  History of falls.  Memory and ability to understand (cognition).  Work and work Statistician. Screening  You may have the following tests or measurements:  Height, weight, and BMI.  Blood pressure.  Lipid and cholesterol levels. These may be checked every 5 years, or more frequently if you are over 14 years old.  Skin check.  Lung cancer screening. You may have this screening every year starting at age 87 if you have a 30-pack-year history of smoking and currently smoke or have quit within the past 15 years.  Fecal occult blood test (FOBT) of the stool. You may have this test every year starting at age 35.  Flexible sigmoidoscopy or colonoscopy. You may have a sigmoidoscopy every 5 years or a colonoscopy every 10 years starting at age 62.  Prostate cancer screening. Recommendations will vary depending on your family history and other risks.  Hepatitis C blood test.  Hepatitis B blood test.  Sexually transmitted disease (STD) testing.  Diabetes screening. This is done by checking your blood sugar (glucose) after you have not eaten for a while (fasting). You may have this done every 1-3 years.  Abdominal aortic aneurysm (AAA) screening. You may need this if you are a current or former smoker.  Osteoporosis. You may be screened starting at  age 15 if you are at high risk. Talk with your health care provider about your test results, treatment options, and if necessary, the need for more tests. Vaccines  Your health care provider may recommend certain vaccines, such as:  Influenza vaccine. This is recommended every year.  Tetanus, diphtheria, and  acellular pertussis (Tdap, Td) vaccine. You may need a Td booster every 10 years.  Zoster vaccine. You may need this after age 71.  Pneumococcal 13-valent conjugate (PCV13) vaccine. One dose is recommended after age 37.  Pneumococcal polysaccharide (PPSV23) vaccine. One dose is recommended after age 64. Talk to your health care provider about which screenings and vaccines you need and how often you need them. This information is not intended to replace advice given to you by your health care provider. Make sure you discuss any questions you have with your health care provider. Document Released: 10/25/2015 Document Revised: 06/17/2016 Document Reviewed: 07/30/2015 Elsevier Interactive Patient Education  2017 [employer] Prevention in the Home Falls can cause injuries. They can happen to people of all ages. There are many things you can do to make your home safe and to help prevent falls. What can I do on the outside of my home?  Regularly fix the edges of walkways and driveways and fix any cracks.  Remove anything that might make you trip as you walk through a door, such as a raised step or threshold.  Trim any bushes or trees on the path to your home.  Use bright outdoor lighting.  Clear any walking paths of anything that might make someone trip, such as rocks or tools.  Regularly check to see if handrails are loose or broken. Make sure that both sides of any steps have handrails.  Any raised decks and porches should have guardrails on the edges.  Have any leaves, snow, or ice cleared regularly.  Use sand or salt on walking paths during winter.  Clean up any spills in your garage right away. This includes oil or grease spills. What can I do in the bathroom?  Use night lights.  Install grab bars by the toilet and in the tub and shower. Do not use towel bars as grab bars.  Use non-skid mats or decals in the tub or shower.  If you need to sit down in the shower, use  a plastic, non-slip stool.  Keep the floor dry. Clean up any water that spills on the floor as soon as it happens.  Remove soap buildup in the tub or shower regularly.  Attach bath mats securely with double-sided non-slip rug tape.  Do not have throw rugs and other things on the floor that can make you trip. What can I do in the bedroom?  Use night lights.  Make sure that you have a light by your bed that is easy to reach.  Do not use any sheets or blankets that are too big for your bed. They should not hang down onto the floor.  Have a firm chair that has side arms. You can use this for support while you get dressed.  Do not have throw rugs and other things on the floor that can make you trip. What can I do in the kitchen?  Clean up any spills right away.  Avoid walking on wet floors.  Keep items that you use a lot in easy-to-reach places.  If you need to reach something above you, use a strong step stool that has a grab bar.  Keep electrical cords out of the way.  Do not use floor polish or wax that makes floors slippery. If you must use wax, use non-skid floor wax.  Do not have throw rugs and other things on the floor that can make you trip. What can I do with my stairs?  Do not leave any items on the stairs.  Make sure that there are handrails on both sides of the stairs and use them. Fix handrails that are broken or loose. Make sure that handrails are as long as the stairways.  Check any carpeting to make sure that it is firmly attached to the stairs. Fix any carpet that is loose or worn.  Avoid having throw rugs at the top or bottom of the stairs. If you do have throw rugs, attach them to the floor with carpet tape.  Make sure that you have a light switch at the top of the stairs and the bottom of the stairs. If you do not have them, ask someone to add them for you. What else can I do to help prevent falls?  Wear shoes that:  Do not have high heels.  Have  rubber bottoms.  Are comfortable and fit you well.  Are closed at the toe. Do not wear sandals.  If you use a stepladder:  Make sure that it is fully opened. Do not climb a closed stepladder.  Make sure that both sides of the stepladder are locked into place.  Ask someone to hold it for you, if possible.  Clearly mark and make sure that you can see:  Any grab bars or handrails.  First and last steps.  Where the edge of each step is.  Use tools that help you move around (mobility aids) if they are needed. These include:  Canes.  Walkers.  Scooters.  Crutches.  Turn on the lights when you go into a dark area. Replace any light bulbs as soon as they burn out.  Set up your furniture so you have a clear path. Avoid moving your furniture around.  If any of your floors are uneven, fix them.  If there are any pets around you, be aware of where they are.  Review your medicines with your doctor. Some medicines can make you feel dizzy. This can increase your chance of falling. Ask your doctor what other things that you can do to help prevent falls. This information is not intended to replace advice given to you by your health care provider. Make sure you discuss any questions you have with your health care provider. Document Released: 07/25/2009 Document Revised: 03/05/2016 Document Reviewed: 11/02/2014 Elsevier Interactive Patient Education  2017 [employer].

## 2020-11-12 NOTE — Progress Notes (Signed)
Subjective:   Guy Moore is a 87 y.o. male who presents for Medicare Annual/Subsequent preventive examination.  Attempted video visit multiple times was unable to establish a good connection with both video and audio so visit changed to a telephone visit.   I connected with Guy Moore today by telephone and verified that I am speaking with the correct person using two identifiers. Location patient: home Location provider: work Persons participating in the virtual visit: patient, provider.   I discussed the limitations, risks, security and privacy concerns of performing an evaluation and management service by telephone and the availability of in person appointments. I also discussed with the patient that there may be a patient responsible charge related to this service. The patient expressed understanding and verbally consented to this telephonic visit.    Interactive audio and video telecommunications were attempted between this provider and patient, however failed, due to patient having technical difficulties OR patient did not have access to video capability.  We continued and completed visit with audio only.      Review of Systems    N/A  Cardiac Risk Factors include: advanced age (>27men, >74 women);male gender;family history of premature cardiovascular disease;dyslipidemia     Objective:    Today's Vitals   11/12/20 1323  PainSc: 3    There is no height or weight on file to calculate BMI.  Advanced Directives 11/12/2020 08/26/2020 08/19/2020 08/06/2020 02/26/2020 11/21/2018 02/27/2016  Does Patient Have a Medical Advance Directive? Yes Yes Yes Yes Yes Yes Yes  Type of Paramedic of [place];Living will [place];Living will Healthcare Power of [place] of [place];Living will [place];Living will [place];Living will  Does patient want to make  changes to medical advance directive? No - Patient declined No - Patient declined No - Patient declined No - Patient declined - - No - Patient declined  Copy of [place] in Chart? No - copy requested No - copy requested No - copy requested No - copy requested No - copy requested No - copy requested No - copy requested  Would patient like information on creating a medical advance directive? - - - - - Yes (ED - Information included in AVS) -    Current Medications (verified) Outpatient Encounter Medications as of 11/12/2020  Medication Sig  . acetaminophen (TYLENOL) 500 MG tablet Take 500-1,000 mg by mouth every 6 (six) hours as needed (for pain).  Marland Kitchen albuterol (VENTOLIN HFA) 108 (90 Base) MCG/ACT inhaler Inhale 2 puffs into the lungs every 6 (six) hours as needed for wheezing or shortness of breath.  Marland Kitchen amLODipine (NORVASC) 5 MG tablet TAKE 1 TABLET DAILY  . BESIVANCE 0.6 % SUSP SMARTSIG:1 In Eye(s)  . fluticasone (FLOVENT DISKUS) 50 MCG/BLIST diskus inhaler Inhale 1 puff into the lungs 2 (two) times daily.  Marland Kitchen MAGNESIUM GLUCONATE PO Take 1 tablet by mouth daily.  . Methylcellulose, Laxative, (CITRUCEL PO) Take 1 tablet by mouth daily after breakfast.  . Multiple Vitamins-Minerals (PRESERVISION AREDS 2) CAPS Take 1 capsule by mouth 2 (two) times daily.  Marland Kitchen omeprazole (PRILOSEC) 40 MG capsule TAKE 1 CAPSULE DAILY  . potassium chloride SA (KLOR-CON) 20 MEQ tablet TAKE 1 TABLET DAILY  . pravastatin (PRAVACHOL) 40 MG tablet TAKE 1 TABLET AT BEDTIME  . valsartan (DIOVAN) 160 MG tablet TAKE 1 TABLET DAILY  . XARELTO 20 MG TABS tablet TAKE 1 TABLET DAILY   No  facility-administered encounter medications on file as of 11/12/2020.    Allergies (verified) Patient has no known allergies.   History: Past Medical History:  Diagnosis Date  . Allergic rhinitis   . Arthritis   . Asthma   . Atrial fibrillation [facility]    se[facility]. [name] at [facility]   . Atrial  flutter ([place])   . BCC (basal cell carcinoma of skin)    Nose  . COPD with emphysema ([place])    sees Dr. [name]   . Depression   . Diabetes mellitus ([place])    Diet control   . Diverticulosis   . Erosive esophagitis   . Esophageal stricture   . Gastric ulcer   . Hearing loss    uses amplification  . Hemorrhoids   . Hiatal hernia   . HTN (hypertension)   . Hyperlipidemia   . 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 Macular degeneration   . Sleep apnea    Past Surgical History:  Procedure Laterality Date  . CARDIAC ELECTROPHYSIOLOGY STUDY AND ABLATION     x 4 (sees Dr. [name] at [facility])  . NOSE SURGERY    . UVULOPALATOPHARYNGOPLASTY  1980's  . VASECTOMY  4098'J   w/complications   Family History  Problem Relation Age of Onset  . Stroke Mother   . Breast cancer Mother   . Heart disease Father   . Hypertension Father   . Cancer Father        Bladder  . Breast cancer Brother   . Atrial fibrillation Brother   . Tremor Brother   . Stroke Brother   . Colon cancer Neg Hx    Social History   Socioeconomic History  . Marital status: Married    Spouse name: Not on file  . Number of children: Not on file  . Years of education: Not on file  . Highest education level: Not on file  Occupational History  . Occupation: Retired at 27Print production planner  . Occupation: 23 years [employer]  . Occupation: Med Environmental education officer  . 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 Smoking status: Former Smoker    Packs/day: 1.00    Years: 28.00    Pack years: 28.00    Types: Cigarettes    Quit date: 10/12/1977    Years since quitting: 43.1  . Smokeless tobacco: Never Used  Substance and Sexual Activity  . Alcohol use: No    Alcohol/week: 0.0 standard drinks  . Drug use: No  . Sexual activity: Not on file  Other Topics Concern  . Not on file  Social History Narrative   END OF LIFE CARE: he does not want CPR, short term mechanical  ventilation. He does not want heroic or futile care especially if he cannon communicate, read and enjoy his loved ones.     Social Determinants of Health   Financial Resource Strain: Low Risk   . Difficulty of Paying Living Expenses: Not hard at all  Food Insecurity: No Food Insecurity  . Worried About Charity fundraiser in the Last Year: Never true  . Ran Out of Food in the Last Year: Never true  Transportation Needs: No Transportation Needs  . Lack of Transportation (Medical): No  . Lack of Transportation (Non-Medical): No  Physical Activity: Inactive  . Days of Exercise per Week: 0 days  . Minutes of Exercise per Session: 0 min  Stress: No Stress  Concern Present  . Feeling of Stress : Not at all  Social Connections: Moderately Integrated  . Frequency of Communication with Friends and Family: More than three times a week  . Frequency of Social Gatherings with Friends and Family: Once a week  . Attends Religious Services: 1 to 4 times per year  . Active Member of Clubs or Organizations: No  . Attends Archivist Meetings: Never  . Marital Status: Married    Tobacco Counseling Counseling given: Not Answered   Clinical Intake:  Pre-visit preparation completed: Yes  Pain : 0-10 Pain Score: 3  Pain Type: Acute pain Pain Location: Eye Pain Orientation: Left,Right Pain Descriptors / Indicators: Aching Pain Onset: Yesterday Pain Frequency: Intermittent     Nutritional Risks: None Diabetes: No  How often do you need to have someone help you when you read instructions, pamphlets, or other written materials from your doctor or pharmacy?: 3 - Sometimes What is the last grade level you completed in school?: Masters Degree  Diabetic? No  Interpreter Needed?: No  Information entered by :: [place] of Daily Living In your present state of health, do you have any difficulty performing the following activities: 11/12/2020 08/19/2020  Hearing? Y N   Comment hard of hearing. Wears bilateral hearing aids -  Vision? Y N  Comment has macular degeneration -  Difficulty concentrating or making decisions? N N  Walking or climbing stairs? N N  Dressing or bathing? N N  Doing errands, shopping? Y -  Comment Due to vision changes does not drive -  Preparing Food and eating ? N -  Using the Toilet? N -  In the past six months, have you accidently leaked urine? N -  Do you have problems with loss of bowel control? N -  Managing your Medications? N -  Managing your Finances? N -  Housekeeping or managing your Housekeeping? N -  Some recent data might be hidden    Patient Care Team: Rendall, Oneida Alar, MD (Inactive) (Orthopedic Surgery) Sable Feil, MD (Gastroenterology) [name], MD as Referring Physician (Cardiology) Clance, Armando Reichert, MD (Pulmonary Disease) Hayden Pedro, MD as Consulting Physician (Ophthalmology) Janith Lima, MD as Consulting Physician (Urology) Clent Jacks, MD as Consulting Physician (Ophthalmology) Curt Bears, MD as Consulting Physician (Oncology)  Indicate any recent Medical Services you may have received from other than [facility] providers in the past year (date may be approximate).     Assessment:   This is a routine wellness examination for Guy Moore.  Hearing/Vision screen  Hearing Screening   125Hz  250Hz  500Hz  1000Hz  2000Hz  3000Hz  4000Hz  6000Hz  8000Hz   Right ear:           Left ear:           Vision Screening Comments: Patient sees Dr. Katy Fitch once per year for routine eye exams, and Dr. Zigmund Daniel for Macular degeneration injection every 4 weeks.  Dietary issues and exercise activities discussed: Current Exercise Habits: The patient does not participate in regular exercise at present, Exercise limited by: cardiac condition(s)  Goals    . Exercise 3x per week (30 min per time)    . Patient Stated     I would like to stay alive!       Depression Screen PHQ 2/9 Scores 11/12/2020  04/12/2015 01/24/2014  PHQ - 2 Score 0 0 0    Fall Risk Fall Risk  11/12/2020 09/06/2019 08/30/2018 05/13/2018 06/09/2016  Falls in the past year?  1 0 0 No No  Comment - Emmi Telephone Survey: data to providers prior to load Emmi Telephone Survey: data to providers prior to load Emmi Telephone Survey: data to providers prior to load Emmi Telephone Survey: data to providers prior to load  Number falls in past yr: 0 - - - -  Comment rolled out of bed - - - -  Injury with Fall? 0 - - - -  Risk for fall due to : History of fall(s) - - - -  Follow up Falls evaluation completed;Falls prevention discussed - - - -    FALL RISK PREVENTION PERTAINING TO THE HOME:  Any stairs in or around the home? Yes  If so, are there any without handrails? No  Home free of loose throw rugs in walkways, pet beds, electrical cords, etc? Yes  Adequate lighting in your home to reduce risk of falls? Yes   ASSISTIVE DEVICES UTILIZED TO PREVENT FALLS:  Life alert? No  Use of a cane, walker or w/c? No  Grab bars in the bathroom? Yes  Shower chair or bench in shower? No  Elevated toilet seat or a handicapped toilet? No    Cognitive Function:   Normal cognitive status assessed by direct observation by this Nurse Health Advisor. No abnormalities found.        Immunizations Immunization History  Administered Date(s) Administered  . Fluad Quad(high Dose 65+) 06/21/2019, 07/10/2020  . Influenza Whole 08/12/2006, 10/21/2007, 07/10/2008, 07/19/2009, 07/12/2012  . Influenza, High Dose Seasonal PF 08/25/2018  . Influenza, Seasonal, Injecte, Preservative Fre 08/04/2013  . Influenza-Unspecified 08/16/2015, 08/15/2016, 08/29/2017  . PFIZER(Purple Top)SARS-COV-2 Vaccination 11/19/2019, 12/13/2019, 08/01/2020, 08/02/2020  . Pneumococcal Conjugate-13 01/17/2015  . Pneumococcal Polysaccharide-23 03/14/2013  . Td 10/09/2005  . Zoster 03/19/2009    TDAP status: Due, Education has been provided regarding the importance of  this vaccine. Advised may receive this vaccine at local pharmacy or Health Dept. Aware to provide a copy of the vaccination record if obtained from local pharmacy or Health Dept. Verbalized acceptance and understanding.  Flu Vaccine status: Up to date  Pneumococcal vaccine status: Up to date  Covid-19 vaccine status: Completed vaccines  Qualifies for Shingles Vaccine? Yes   Zostavax completed Yes   Shingrix Completed?: No.    Education has been provided regarding the importance of this vaccine. Patient has been advised to call insurance company to determine out of pocket expense if they have not yet received this vaccine. Advised may also receive vaccine at local pharmacy or Health Dept. Verbalized acceptance and understanding.  Screening Tests Health Maintenance  Topic Date Due  . OPHTHALMOLOGY EXAM  06/26/2010  . FOOT EXAM  06/28/2015  . TETANUS/TDAP  10/10/2015  . HEMOGLOBIN A1C  08/22/2020  . COVID-19 Vaccine (4 - Booster for Pfizer series) 01/31/2021  . INFLUENZA VACCINE  Completed  . PNA vac Low Risk Adult  Completed    Health Maintenance  Health Maintenance Due  Topic Date Due  . OPHTHALMOLOGY EXAM  06/26/2010  . FOOT EXAM  06/28/2015  . TETANUS/TDAP  10/10/2015  . HEMOGLOBIN A1C  08/22/2020    Colorectal cancer screening: No longer required.   Lung Cancer Screening: (Low Dose CT Chest recommended if Age 53-80 years, 30 pack-year currently smoking OR have quit w/in 15years.) does not qualify.   Lung Cancer Screening Referral: N/A   Additional Screening:  Hepatitis C Screening: does not qualify;   Vision Screening: Recommended annual ophthalmology exams for early detection of glaucoma and other disorders  of the eye. Is the patient up to date with their annual eye exam?  Yes  Who is the provider or what is the name of the office in which the patient attends annual eye exams? Dr. Zigmund Daniel  If pt is not established with a provider, would they like to be referred to a  provider to establish care? No .   Dental Screening: Recommended annual dental exams for proper oral hygiene  Community Resource Referral / Chronic Care Management: CRR required this visit?  No   CCM required this visit?  No      Plan:     I have personally reviewed and noted the following in the patient's chart:   . Medical and social history . Use of alcohol, tobacco or illicit drugs  . Current medications and supplements . Functional ability and status . Nutritional status . Physical activity . Advanced directives . List of other physicians . Hospitalizations, surgeries, and ER visits in previous 12 months . Vitals . Screenings to include cognitive, depression, and falls . Referrals and appointments  In addition, I have reviewed and discussed with patient certain preventive protocols, quality metrics, and best practice recommendations. A written personalized care plan for preventive services as well as general preventive health recommendations were provided to patient.     Ofilia Neas, LPN   [id number]   Nurse Notes: None

## 2020-11-17 DIAGNOSIS — Z95818 Presence of other cardiac implants and grafts: Secondary | ICD-10-CM | POA: Diagnosis not present

## 2020-12-02 ENCOUNTER — Other Ambulatory Visit: Payer: Self-pay

## 2020-12-02 ENCOUNTER — Ambulatory Visit (INDEPENDENT_AMBULATORY_CARE_PROVIDER_SITE_OTHER): Payer: Medicare Other | Admitting: *Deleted

## 2020-12-02 DIAGNOSIS — E538 Deficiency of other specified B group vitamins: Secondary | ICD-10-CM

## 2020-12-02 MED ORDER — CYANOCOBALAMIN 1000 MCG/ML IJ SOLN
1000.0000 ug | Freq: Once | INTRAMUSCULAR | Status: AC
  Administered 2020-12-02: 1000 ug via INTRAMUSCULAR

## 2020-12-02 NOTE — Progress Notes (Signed)
Per orders of Dr. Fry, injection of Cyanocobalamin 1000mcg given by Funderburk, Jo A. Patient tolerated injection well.  

## 2020-12-09 ENCOUNTER — Other Ambulatory Visit: Payer: Self-pay

## 2020-12-09 ENCOUNTER — Encounter (INDEPENDENT_AMBULATORY_CARE_PROVIDER_SITE_OTHER): Payer: Medicare Other | Admitting: Ophthalmology

## 2020-12-09 DIAGNOSIS — D3131 Benign neoplasm of right choroid: Secondary | ICD-10-CM | POA: Diagnosis not present

## 2020-12-09 DIAGNOSIS — I10 Essential (primary) hypertension: Secondary | ICD-10-CM | POA: Diagnosis not present

## 2020-12-09 DIAGNOSIS — H353231 Exudative age-related macular degeneration, bilateral, with active choroidal neovascularization: Secondary | ICD-10-CM

## 2020-12-09 DIAGNOSIS — H43813 Vitreous degeneration, bilateral: Secondary | ICD-10-CM

## 2020-12-09 DIAGNOSIS — H35033 Hypertensive retinopathy, bilateral: Secondary | ICD-10-CM | POA: Diagnosis not present

## 2020-12-12 DIAGNOSIS — D3131 Benign neoplasm of right choroid: Secondary | ICD-10-CM | POA: Diagnosis not present

## 2020-12-12 DIAGNOSIS — Z9842 Cataract extraction status, left eye: Secondary | ICD-10-CM | POA: Diagnosis not present

## 2020-12-12 DIAGNOSIS — I10 Essential (primary) hypertension: Secondary | ICD-10-CM | POA: Diagnosis not present

## 2020-12-12 DIAGNOSIS — H318 Other specified disorders of choroid: Secondary | ICD-10-CM | POA: Diagnosis not present

## 2020-12-12 DIAGNOSIS — Z961 Presence of intraocular lens: Secondary | ICD-10-CM | POA: Diagnosis not present

## 2020-12-12 DIAGNOSIS — Z9841 Cataract extraction status, right eye: Secondary | ICD-10-CM | POA: Diagnosis not present

## 2020-12-12 DIAGNOSIS — H35033 Hypertensive retinopathy, bilateral: Secondary | ICD-10-CM | POA: Diagnosis not present

## 2020-12-12 DIAGNOSIS — H353231 Exudative age-related macular degeneration, bilateral, with active choroidal neovascularization: Secondary | ICD-10-CM | POA: Diagnosis not present

## 2020-12-12 DIAGNOSIS — H04123 Dry eye syndrome of bilateral lacrimal glands: Secondary | ICD-10-CM | POA: Diagnosis not present

## 2020-12-27 ENCOUNTER — Other Ambulatory Visit: Payer: Self-pay

## 2020-12-30 ENCOUNTER — Ambulatory Visit (INDEPENDENT_AMBULATORY_CARE_PROVIDER_SITE_OTHER): Payer: Medicare Other | Admitting: *Deleted

## 2020-12-30 ENCOUNTER — Other Ambulatory Visit: Payer: Self-pay

## 2020-12-30 DIAGNOSIS — E538 Deficiency of other specified B group vitamins: Secondary | ICD-10-CM

## 2020-12-30 MED ORDER — CYANOCOBALAMIN 1000 MCG/ML IJ SOLN
1000.0000 ug | Freq: Once | INTRAMUSCULAR | Status: AC
  Administered 2020-12-30: 1000 ug via INTRAMUSCULAR

## 2020-12-30 NOTE — Progress Notes (Signed)
Per orders of Dr. Fry, injection of Cyanocobalamin 1000mcg given by Adrienne Trombetta A. Patient tolerated injection well.  

## 2021-01-06 ENCOUNTER — Other Ambulatory Visit: Payer: Self-pay

## 2021-01-06 ENCOUNTER — Encounter (INDEPENDENT_AMBULATORY_CARE_PROVIDER_SITE_OTHER): Payer: Medicare Other | Admitting: Ophthalmology

## 2021-01-06 DIAGNOSIS — H43813 Vitreous degeneration, bilateral: Secondary | ICD-10-CM | POA: Diagnosis not present

## 2021-01-06 DIAGNOSIS — H353231 Exudative age-related macular degeneration, bilateral, with active choroidal neovascularization: Secondary | ICD-10-CM

## 2021-01-06 DIAGNOSIS — H35033 Hypertensive retinopathy, bilateral: Secondary | ICD-10-CM | POA: Diagnosis not present

## 2021-01-06 DIAGNOSIS — I10 Essential (primary) hypertension: Secondary | ICD-10-CM

## 2021-01-06 DIAGNOSIS — D3131 Benign neoplasm of right choroid: Secondary | ICD-10-CM | POA: Diagnosis not present

## 2021-01-30 ENCOUNTER — Ambulatory Visit (INDEPENDENT_AMBULATORY_CARE_PROVIDER_SITE_OTHER): Payer: Medicare Other | Admitting: *Deleted

## 2021-01-30 ENCOUNTER — Other Ambulatory Visit: Payer: Self-pay

## 2021-01-30 DIAGNOSIS — E538 Deficiency of other specified B group vitamins: Secondary | ICD-10-CM | POA: Diagnosis not present

## 2021-01-30 MED ORDER — CYANOCOBALAMIN 1000 MCG/ML IJ SOLN
1000.0000 ug | Freq: Once | INTRAMUSCULAR | Status: AC
  Administered 2021-01-30: 1000 ug via INTRAMUSCULAR

## 2021-01-30 NOTE — Progress Notes (Signed)
Per orders of Dr. Hernandez, injection of Cyanocobalamin 1000mcg given by Tremar Wickens A. Patient tolerated injection well. 

## 2021-02-03 ENCOUNTER — Encounter (INDEPENDENT_AMBULATORY_CARE_PROVIDER_SITE_OTHER): Payer: Medicare Other | Admitting: Ophthalmology

## 2021-02-03 ENCOUNTER — Other Ambulatory Visit: Payer: Self-pay | Admitting: Pulmonary Disease

## 2021-02-03 ENCOUNTER — Other Ambulatory Visit: Payer: Self-pay

## 2021-02-03 DIAGNOSIS — I10 Essential (primary) hypertension: Secondary | ICD-10-CM

## 2021-02-03 DIAGNOSIS — D3131 Benign neoplasm of right choroid: Secondary | ICD-10-CM | POA: Diagnosis not present

## 2021-02-03 DIAGNOSIS — H353231 Exudative age-related macular degeneration, bilateral, with active choroidal neovascularization: Secondary | ICD-10-CM | POA: Diagnosis not present

## 2021-02-03 DIAGNOSIS — H35033 Hypertensive retinopathy, bilateral: Secondary | ICD-10-CM | POA: Diagnosis not present

## 2021-02-03 DIAGNOSIS — H43813 Vitreous degeneration, bilateral: Secondary | ICD-10-CM | POA: Diagnosis not present

## 2021-02-04 DIAGNOSIS — H6123 Impacted cerumen, bilateral: Secondary | ICD-10-CM | POA: Diagnosis not present

## 2021-02-16 DIAGNOSIS — Z95818 Presence of other cardiac implants and grafts: Secondary | ICD-10-CM | POA: Diagnosis not present

## 2021-02-24 ENCOUNTER — Inpatient Hospital Stay: Payer: Medicare Other | Attending: Internal Medicine | Admitting: Internal Medicine

## 2021-02-24 ENCOUNTER — Other Ambulatory Visit: Payer: Self-pay

## 2021-02-24 ENCOUNTER — Inpatient Hospital Stay: Payer: Medicare Other

## 2021-02-24 ENCOUNTER — Encounter: Payer: Self-pay | Admitting: Internal Medicine

## 2021-02-24 VITALS — BP 152/84 | HR 91 | Temp 98.0°F | Resp 18 | Ht 70.0 in | Wt 254.7 lb

## 2021-02-24 DIAGNOSIS — Z79899 Other long term (current) drug therapy: Secondary | ICD-10-CM | POA: Insufficient documentation

## 2021-02-24 DIAGNOSIS — D696 Thrombocytopenia, unspecified: Secondary | ICD-10-CM | POA: Diagnosis not present

## 2021-02-24 DIAGNOSIS — D539 Nutritional anemia, unspecified: Secondary | ICD-10-CM

## 2021-02-24 DIAGNOSIS — Z7951 Long term (current) use of inhaled steroids: Secondary | ICD-10-CM | POA: Diagnosis not present

## 2021-02-24 DIAGNOSIS — E538 Deficiency of other specified B group vitamins: Secondary | ICD-10-CM

## 2021-02-24 DIAGNOSIS — D72819 Decreased white blood cell count, unspecified: Secondary | ICD-10-CM | POA: Insufficient documentation

## 2021-02-24 DIAGNOSIS — Z7901 Long term (current) use of anticoagulants: Secondary | ICD-10-CM | POA: Insufficient documentation

## 2021-02-24 LAB — CBC WITH DIFFERENTIAL (CANCER CENTER ONLY)
Abs Immature Granulocytes: 0.01 10*3/uL (ref 0.00–0.07)
Basophils Absolute: 0 10*3/uL (ref 0.0–0.1)
Basophils Relative: 1 %
Eosinophils Absolute: 0.2 10*3/uL (ref 0.0–0.5)
Eosinophils Relative: 5 %
HCT: 40.8 % (ref 39.0–52.0)
Hemoglobin: 13.7 g/dL (ref 13.0–17.0)
Immature Granulocytes: 0 %
Lymphocytes Relative: 16 %
Lymphs Abs: 0.5 10*3/uL — ABNORMAL LOW (ref 0.7–4.0)
MCH: 29.9 pg (ref 26.0–34.0)
MCHC: 33.6 g/dL (ref 30.0–36.0)
MCV: 89.1 fL (ref 80.0–100.0)
Monocytes Absolute: 0.3 10*3/uL (ref 0.1–1.0)
Monocytes Relative: 9 %
Neutro Abs: 2.2 10*3/uL (ref 1.7–7.7)
Neutrophils Relative %: 69 %
Platelet Count: 104 10*3/uL — ABNORMAL LOW (ref 150–400)
RBC: 4.58 MIL/uL (ref 4.22–5.81)
RDW: 14.6 % (ref 11.5–15.5)
WBC Count: 3.3 10*3/uL — ABNORMAL LOW (ref 4.0–10.5)
nRBC: 0 % (ref 0.0–0.2)

## 2021-02-24 LAB — FOLATE: Folate: 7.7 ng/mL

## 2021-02-24 LAB — CMP (CANCER CENTER ONLY)
ALT: 14 U/L (ref 0–44)
AST: 18 U/L (ref 15–41)
Albumin: 4 g/dL (ref 3.5–5.0)
Alkaline Phosphatase: 44 U/L (ref 38–126)
Anion gap: 8 (ref 5–15)
BUN: 21 mg/dL (ref 8–23)
CO2: 27 mmol/L (ref 22–32)
Calcium: 9.4 mg/dL (ref 8.9–10.3)
Chloride: 106 mmol/L (ref 98–111)
Creatinine: 1.07 mg/dL (ref 0.61–1.24)
GFR, Estimated: >60 mL/min (ref >60)
Glucose, Bld: 94 mg/dL (ref 70–99)
Potassium: 4.6 mmol/L (ref 3.5–5.1)
Sodium: 141 mmol/L (ref 135–145)
Total Bilirubin: 0.9 mg/dL (ref 0.3–1.2)
Total Protein: 6.7 g/dL (ref 6.5–8.1)

## 2021-02-24 LAB — LACTATE DEHYDROGENASE: LDH: 172 U/L (ref 98–192)

## 2021-02-24 LAB — VITAMIN B12: Vitamin B-12: 402 pg/mL (ref 180–914)

## 2021-02-24 NOTE — Progress Notes (Signed)
[place] [phone]   [phone]  OFFICE PROGRESS NOTE  [place], [place] [address]  DIAGNOSIS: Intermittent Bicytopenia  PRIOR THERAPY: None  CURRENT THERAPY:  Vitamin B12 injection on a monthly basis  INTERVAL HISTORY: Guy Moore 87 y.o. male returns to the clinic today for follow-up visit accompanied by his wife.  The patient is feeling fine today with no concerning complaints except for mild fatigue.  He is currently on vitamin B12 injection on monthly basis and has been tolerating it fairly well.  He denied having any current chest pain, shortness of breath, cough or hemoptysis.  He denied having any fever or chills.  He has no nausea, vomiting, diarrhea or constipation.  He has no headache or visual changes.  He has no weight loss or night sweats.  He seems to be interested in the watchman procedure for his atrial fibrillation and he is discussing this in more details with his electrophysiologist. The patient is here today for evaluation and repeat blood work.  MEDICAL HISTORY: Past Medical History:  Diagnosis Date  . Allergic rhinitis   . Arthritis   . Asthma   . Atrial fibrillation [facility])    sees Dr. [name] at [facility]   . Atrial flutter ([place])   . BCC (basal cell carcinoma of skin)    Nose  . COPD with emphysema ([place])    sees Dr. [name]   . Depression   . Diabetes mellitus ([place])    Diet control   . Diverticulosis   . Erosive esophagitis   . Esophageal stricture   . Gastric ulcer   . Hearing loss    uses amplification  . Hemorrhoids   . Hiatal hernia   . HTN (hypertension)   . Hyperlipidemia   . 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 Macular degeneration   . Sleep apnea     ALLERGIES:  has No Known Allergies.  MEDICATIONS:  Current Outpatient Medications  Medication Sig Dispense Refill  . acetaminophen (TYLENOL) 500 MG  tablet Take 500-1,000 mg by mouth every 6 (six) hours as needed (for pain).    Marland Kitchen albuterol (VENTOLIN HFA) 108 (90 Base) MCG/ACT inhaler USE 2 INHALATIONS EVERY 6 HOURS AS NEEDED FOR WHEEZING OR SHORTNESS OF BREATH 54 g 3  . amLODipine (NORVASC) 5 MG tablet TAKE 1 TABLET DAILY 90 tablet 3  . BESIVANCE 0.6 % SUSP SMARTSIG:1 In Eye(s)    . fluticasone (FLOVENT DISKUS) 50 MCG/BLIST diskus inhaler Inhale 1 puff into the lungs 2 (two) times daily. 180 each 3  . MAGNESIUM GLUCONATE PO Take 1 tablet by mouth daily.    . Methylcellulose, Laxative, (CITRUCEL PO) Take 1 tablet by mouth daily after breakfast.    . Multiple Vitamins-Minerals (PRESERVISION AREDS 2) CAPS Take 1 capsule by mouth 2 (two) times daily.    Marland Kitchen omeprazole (PRILOSEC) 40 MG capsule TAKE 1 CAPSULE DAILY 90 capsule 3  . potassium chloride SA (KLOR-CON) 20 MEQ tablet TAKE 1 TABLET DAILY 90 tablet 3  . pravastatin (PRAVACHOL) 40 MG tablet TAKE 1 TABLET AT BEDTIME 90 tablet 3  . valsartan (DIOVAN) 160 MG tablet TAKE 1 TABLET DAILY 90 tablet 3  . XARELTO 20 MG TABS tablet TAKE 1 TABLET DAILY 90 tablet 3   No current facility-administered medications for this visit.    SURGICAL HISTORY:  Past Surgical History:  Procedure Laterality  Date  . CARDIAC ELECTROPHYSIOLOGY STUDY AND ABLATION     x 4 (sees Dr. [name] at [facility])  . NOSE SURGERY    . UVULOPALATOPHARYNGOPLASTY  1980's  . VASECTOMY  1884'Z   w/complications    REVIEW OF SYSTEMS:  A comprehensive review of systems was negative except for: Constitutional: positive for fatigue   PHYSICAL EXAMINATION: General appearance: alert, cooperative, fatigued and no distress Head: Normocephalic, without obvious abnormality, atraumatic Neck: no adenopathy, no JVD, supple, symmetrical, trachea midline and thyroid not enlarged, symmetric, no tenderness/mass/nodules Lymph nodes: Cervical, supraclavicular, and axillary nodes normal. Resp: clear to auscultation bilaterally Back:  symmetric, no curvature. ROM normal. No CVA tenderness. Cardio: regular rate and rhythm, S1, S2 normal, no murmur, click, rub or gallop GI: soft, non-tender; bowel sounds normal; no masses,  no organomegaly Extremities: extremities normal, atraumatic, no cyanosis or edema  ECOG PERFORMANCE STATUS: 1 - Symptomatic but completely ambulatory  Blood pressure (!) 152/84, pulse 91, temperature 98 F (36.7 C), temperature source Oral, resp. rate 18, height _0  (1.778 m), weight 254 lb 11.2 oz (115.5 kg), SpO2 95 %.  LABORATORY DATA: Lab Results  Component Value Date   WBC 3.3 (L) 02/24/2021   HGB 13.7 02/24/2021   HCT 40.8 02/24/2021   MCV 89.1 02/24/2021   PLT 104 (L) 02/24/2021      Chemistry      Component Value Date/Time   NA 141 02/24/2021 1333   NA 141 06/27/2015 0000   K 4.6 02/24/2021 1333   CL 106 02/24/2021 1333   CO2 27 02/24/2021 1333   BUN 21 02/24/2021 1333   BUN 21 06/27/2015 0000   CREATININE 1.07 02/24/2021 1333   GLU 121 06/27/2015 0000      Component Value Date/Time   CALCIUM 9.4 02/24/2021 1333   ALKPHOS 44 02/24/2021 1333   AST 18 02/24/2021 1333   ALT 14 02/24/2021 1333   BILITOT 0.9 02/24/2021 1333       RADIOGRAPHIC STUDIES: No results found.  ASSESSMENT AND PLAN: This is a very pleasant 87 years old white male with bicytopenia including leukocytopenia and thrombocytopenia likely secondary to iron deficiency but early MDS could not be completely excluded. The patient had a bone marrow biopsy and aspirate performed recently that was nonspecific but again could not rule out early myelodysplastic syndrome. The patient is currently on observation as well as vitamin B12 injection and has been tolerating his treatment well. Repeat CBC today showed a stable hemoglobin and hematocrit as well as slight improvement in the total white blood count and stable platelets count. I recommended for him to continue with the vitamin B12 injection for now. I will see  him back for follow-up visit in 6 months for evaluation and repeat blood work. He was advised to call immediately if he has any other concerning symptoms in the interval. The patient voices understanding of current disease status and treatment options and is in agreement with the current care plan.  All questions were answered. The patient knows to call the clinic with any problems, questions or concerns. We can certainly see the patient much sooner if necessary.  Disclaimer: This note was dictated with voice recognition software. Similar sounding words can inadvertently be transcribed and may not be corrected upon review.

## 2021-02-25 ENCOUNTER — Other Ambulatory Visit: Payer: Self-pay

## 2021-02-25 ENCOUNTER — Ambulatory Visit (INDEPENDENT_AMBULATORY_CARE_PROVIDER_SITE_OTHER): Payer: Medicare Other | Admitting: *Deleted

## 2021-02-25 DIAGNOSIS — E538 Deficiency of other specified B group vitamins: Secondary | ICD-10-CM

## 2021-02-25 MED ORDER — CYANOCOBALAMIN 1000 MCG/ML IJ SOLN
1000.0000 ug | Freq: Once | INTRAMUSCULAR | Status: AC
  Administered 2021-02-25: 1000 ug via INTRAMUSCULAR

## 2021-02-25 NOTE — Progress Notes (Signed)
Per orders of Dr. Fry, injection of Cyanocobalamin 1000mcg given by Shelley Cocke A. Patient tolerated injection well.  

## 2021-02-27 ENCOUNTER — Telehealth: Payer: Self-pay | Admitting: Internal Medicine

## 2021-02-27 NOTE — Telephone Encounter (Signed)
Scheduled per los. Called and left msg. Mailed printout  °

## 2021-02-28 ENCOUNTER — Telehealth: Payer: Self-pay | Admitting: Pulmonary Disease

## 2021-02-28 MED ORDER — FLOVENT DISKUS 50 MCG/BLIST IN AEPB: 1.0000 | INHALATION_SPRAY | Freq: Two times a day (BID) | RESPIRATORY_TRACT | 0 refills | Status: DC

## 2021-02-28 NOTE — Telephone Encounter (Signed)
I called and spoke with patient regarding Flovent inhaler. He is getting shipment from [facility] in 2-4 days but worried about running out. I went ahead and sent in 1 refill to help in case the shipment is late to preferred pharmacy. Patient verbalized understanding, nothing further needed.

## 2021-03-03 ENCOUNTER — Other Ambulatory Visit: Payer: Self-pay

## 2021-03-03 ENCOUNTER — Encounter (INDEPENDENT_AMBULATORY_CARE_PROVIDER_SITE_OTHER): Payer: Medicare Other | Admitting: Ophthalmology

## 2021-03-03 DIAGNOSIS — D3131 Benign neoplasm of right choroid: Secondary | ICD-10-CM

## 2021-03-03 DIAGNOSIS — H353231 Exudative age-related macular degeneration, bilateral, with active choroidal neovascularization: Secondary | ICD-10-CM | POA: Diagnosis not present

## 2021-03-03 DIAGNOSIS — H43813 Vitreous degeneration, bilateral: Secondary | ICD-10-CM

## 2021-03-03 DIAGNOSIS — I10 Essential (primary) hypertension: Secondary | ICD-10-CM

## 2021-03-03 DIAGNOSIS — H35033 Hypertensive retinopathy, bilateral: Secondary | ICD-10-CM | POA: Diagnosis not present

## 2021-03-05 DIAGNOSIS — L720 Epidermal cyst: Secondary | ICD-10-CM | POA: Diagnosis not present

## 2021-03-05 DIAGNOSIS — I8310 Varicose veins of unspecified lower extremity with inflammation: Secondary | ICD-10-CM | POA: Diagnosis not present

## 2021-03-05 DIAGNOSIS — C44519 Basal cell carcinoma of skin of other part of trunk: Secondary | ICD-10-CM | POA: Diagnosis not present

## 2021-03-05 DIAGNOSIS — L821 Other seborrheic keratosis: Secondary | ICD-10-CM | POA: Diagnosis not present

## 2021-03-05 DIAGNOSIS — D225 Melanocytic nevi of trunk: Secondary | ICD-10-CM | POA: Diagnosis not present

## 2021-03-05 DIAGNOSIS — L570 Actinic keratosis: Secondary | ICD-10-CM | POA: Diagnosis not present

## 2021-03-05 DIAGNOSIS — D485 Neoplasm of uncertain behavior of skin: Secondary | ICD-10-CM | POA: Diagnosis not present

## 2021-03-25 DIAGNOSIS — I4891 Unspecified atrial fibrillation: Secondary | ICD-10-CM | POA: Diagnosis not present

## 2021-03-25 DIAGNOSIS — Z950 Presence of cardiac pacemaker: Secondary | ICD-10-CM | POA: Diagnosis not present

## 2021-04-03 DIAGNOSIS — H353231 Exudative age-related macular degeneration, bilateral, with active choroidal neovascularization: Secondary | ICD-10-CM | POA: Diagnosis not present

## 2021-04-03 DIAGNOSIS — D3131 Benign neoplasm of right choroid: Secondary | ICD-10-CM | POA: Diagnosis not present

## 2021-04-03 DIAGNOSIS — H02831 Dermatochalasis of right upper eyelid: Secondary | ICD-10-CM | POA: Diagnosis not present

## 2021-04-03 DIAGNOSIS — Z961 Presence of intraocular lens: Secondary | ICD-10-CM | POA: Diagnosis not present

## 2021-04-03 DIAGNOSIS — H04123 Dry eye syndrome of bilateral lacrimal glands: Secondary | ICD-10-CM | POA: Diagnosis not present

## 2021-04-03 DIAGNOSIS — H02834 Dermatochalasis of left upper eyelid: Secondary | ICD-10-CM | POA: Diagnosis not present

## 2021-04-03 LAB — HM DIABETES EYE EXAM

## 2021-04-07 ENCOUNTER — Encounter (INDEPENDENT_AMBULATORY_CARE_PROVIDER_SITE_OTHER): Payer: Medicare Other | Admitting: Ophthalmology

## 2021-04-07 ENCOUNTER — Other Ambulatory Visit: Payer: Self-pay

## 2021-04-07 DIAGNOSIS — I10 Essential (primary) hypertension: Secondary | ICD-10-CM | POA: Diagnosis not present

## 2021-04-07 DIAGNOSIS — D3131 Benign neoplasm of right choroid: Secondary | ICD-10-CM

## 2021-04-07 DIAGNOSIS — H35033 Hypertensive retinopathy, bilateral: Secondary | ICD-10-CM

## 2021-04-07 DIAGNOSIS — H43813 Vitreous degeneration, bilateral: Secondary | ICD-10-CM

## 2021-04-07 DIAGNOSIS — H353231 Exudative age-related macular degeneration, bilateral, with active choroidal neovascularization: Secondary | ICD-10-CM

## 2021-04-09 ENCOUNTER — Ambulatory Visit (INDEPENDENT_AMBULATORY_CARE_PROVIDER_SITE_OTHER): Payer: Medicare Other

## 2021-04-09 ENCOUNTER — Other Ambulatory Visit: Payer: Self-pay

## 2021-04-09 DIAGNOSIS — E538 Deficiency of other specified B group vitamins: Secondary | ICD-10-CM

## 2021-04-09 MED ORDER — CYANOCOBALAMIN 1000 MCG/ML IJ SOLN
1000.0000 ug | Freq: Once | INTRAMUSCULAR | Status: AC
Start: 2021-04-09 — End: 2021-04-09
  Administered 2021-04-09: 1000 ug via INTRAMUSCULAR

## 2021-04-09 NOTE — Progress Notes (Signed)
Per orders of Dr. Sarajane Jews, injection of Cyanocobalamin 1,000 mg/mL given by Wyvonne Lenz. Patient tolerated injection well.

## 2021-04-12 ENCOUNTER — Other Ambulatory Visit: Payer: Self-pay | Admitting: Family Medicine

## 2021-04-15 NOTE — Telephone Encounter (Signed)
Pt needs appointment for further refills 

## 2021-04-30 DIAGNOSIS — H938X3 Other specified disorders of ear, bilateral: Secondary | ICD-10-CM | POA: Diagnosis not present

## 2021-05-05 ENCOUNTER — Other Ambulatory Visit: Payer: Self-pay

## 2021-05-05 ENCOUNTER — Encounter (INDEPENDENT_AMBULATORY_CARE_PROVIDER_SITE_OTHER): Payer: Medicare Other | Admitting: Ophthalmology

## 2021-05-05 DIAGNOSIS — H43813 Vitreous degeneration, bilateral: Secondary | ICD-10-CM

## 2021-05-05 DIAGNOSIS — D485 Neoplasm of uncertain behavior of skin: Secondary | ICD-10-CM | POA: Diagnosis not present

## 2021-05-05 DIAGNOSIS — L570 Actinic keratosis: Secondary | ICD-10-CM | POA: Diagnosis not present

## 2021-05-05 DIAGNOSIS — H353231 Exudative age-related macular degeneration, bilateral, with active choroidal neovascularization: Secondary | ICD-10-CM | POA: Diagnosis not present

## 2021-05-05 DIAGNOSIS — C44619 Basal cell carcinoma of skin of left upper limb, including shoulder: Secondary | ICD-10-CM | POA: Diagnosis not present

## 2021-05-05 DIAGNOSIS — D3131 Benign neoplasm of right choroid: Secondary | ICD-10-CM | POA: Diagnosis not present

## 2021-05-05 DIAGNOSIS — H35033 Hypertensive retinopathy, bilateral: Secondary | ICD-10-CM

## 2021-05-05 DIAGNOSIS — L853 Xerosis cutis: Secondary | ICD-10-CM | POA: Diagnosis not present

## 2021-05-05 DIAGNOSIS — L905 Scar conditions and fibrosis of skin: Secondary | ICD-10-CM | POA: Diagnosis not present

## 2021-05-05 DIAGNOSIS — I10 Essential (primary) hypertension: Secondary | ICD-10-CM

## 2021-05-05 DIAGNOSIS — Z85828 Personal history of other malignant neoplasm of skin: Secondary | ICD-10-CM | POA: Diagnosis not present

## 2021-05-09 ENCOUNTER — Other Ambulatory Visit: Payer: Self-pay

## 2021-05-09 ENCOUNTER — Ambulatory Visit (INDEPENDENT_AMBULATORY_CARE_PROVIDER_SITE_OTHER): Payer: Medicare Other

## 2021-05-09 DIAGNOSIS — E538 Deficiency of other specified B group vitamins: Secondary | ICD-10-CM | POA: Diagnosis not present

## 2021-05-09 MED ORDER — CYANOCOBALAMIN 1000 MCG/ML IJ SOLN
1000.0000 ug | Freq: Once | INTRAMUSCULAR | Status: AC
  Administered 2021-05-09: 1000 ug via INTRAMUSCULAR

## 2021-05-09 NOTE — Progress Notes (Signed)
Per orders of Laurey Morale, MD, injection of B12 given in right deltoid by Kara Mierzejewski D Kailie Polus. Patient tolerated injection well.  Lab Results  Component Value Date   VITAMINB12 402 02/24/2021

## 2021-05-17 ENCOUNTER — Telehealth: Payer: Self-pay | Admitting: Internal Medicine

## 2021-05-17 MED ORDER — NIRMATRELVIR/RITONAVIR (PAXLOVID)TABLET: 3.0000 | ORAL_TABLET | Freq: Two times a day (BID) | ORAL | 0 refills | Status: AC

## 2021-05-17 NOTE — Telephone Encounter (Signed)
S/p vax 3   05/16/21 onset fatigue /aching/  fever to 101.4  rx tylenol  05/17/21 with dry cough / achy / has not needed saba    Creat ok but on prevachol/ xarelto so rec paxlovid standard dosing and hold prevachol while on it reduce xarelto by half while on it.

## 2021-05-18 ENCOUNTER — Encounter: Payer: Self-pay | Admitting: Family Medicine

## 2021-05-18 DIAGNOSIS — Z95818 Presence of other cardiac implants and grafts: Secondary | ICD-10-CM | POA: Diagnosis not present

## 2021-05-19 ENCOUNTER — Telehealth: Payer: Self-pay | Admitting: Pulmonary Disease

## 2021-05-19 NOTE — Telephone Encounter (Signed)
Dr. Halford Chessman, Do you want him to be covid tested prior to having an OV?  Please advise.  Thank you.

## 2021-05-19 NOTE — Telephone Encounter (Signed)
Noted  

## 2021-05-19 NOTE — Telephone Encounter (Signed)
He needs COVID test prior to visit.

## 2021-05-19 NOTE — Telephone Encounter (Signed)
Pt stated that on Friday evening he had a 101.4 fever, has a headache, some breathing pains, and has had aches and pains really bad, and stated that he was prescribed PAXLOVID but should only use it if he is not feeling better throughout the weekend. Stated that he still has a fever and feeling fatigued and stated that he did a home test for Covid-19 but it was inconclsive and he is just wanting to know what he should do moving forward because he still has some concerns. Pt stated that he has been taking his albuterol inhaler as well.   Pharmacy; [facility] [place], [place] AT [address]; [phone]

## 2021-05-19 NOTE — Telephone Encounter (Signed)
Primary Pulmonologist: Sood Last office visit and with whom: 06/12/2021 Geraldo Pitter NP What do we see them for (pulmonary problems): OSA, COPD with Asthma Last OV assessment/plan:   Assessment & Plan:    OBSTRUCTIVE SLEEP APNEA - Patient is 100% compliant with CPAP and reports benefit form use.  - Auto CPAP 5-20cm h20 (05/01/20-06/09/20); average AHI 3.7 with median CPAP 8 and 95th percentile CPAP 11 - Change pressure auto CPAP 5-15cm h20 - Advised patient to continue to wear CPAP appliance every night for min 4-6 hours or more - DME company is [employer] - FU in 1 year with Dr. 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 on ICS inhaler; no recent exacerbation - Continue Flovent Diskus 33mg one puff twice daily; prn ventolin q 6 hours for shortness of breath/whezing      EMartyn Ehrich NP 06/12/2020         Assessment & Plan Note by WMartyn Ehrich NP at 06/12/2020 11:15 AM  Author: WMartyn Ehrich NP Author Type: Nurse Practitioner Filed: 06/12/2020 11:16 AM  Note Status: Written Cosign: Cosign Not Required Encounter Date: 06/12/2020  Problem: COPD with asthma ([facility]  Editor: WMartyn Ehrich NP (Nurse Practitioner)               - Well controlled on ICS inhaler; no recent exacerbation - Continue Flovent Diskus 539m one puff twice daily; prn ventolin q 6 hours for shortness of breath/whezing         Assessment & Plan Note by WaMartyn EhrichNP at 06/12/2020 11:09 AM  Author: WaMartyn EhrichNP Author Type: Nurse Practitioner Filed: 06/12/2020 11:14 AM  Note Status: EdBernell ListCosign Not Required Encounter Date: 06/12/2020  Problem: OBSTRUCTIVE SLEEP APNEA  Editor: WaMartyn EhrichNP (Nurse Practitioner)      Prior Versions: 1. WaMartyn EhrichNP (Nurse Practitioner) at 06/12/2020 11:13 AM - Written    - Patient is 100% compliant with CPAP and reports benefit form use.  - Auto CPAP 5-20cm h20 (05/01/20-06/09/20); average AHI 3.7 with median  CPAP 8 and 95th percentile CPAP 11 - Change pressure auto CPAP 5-15cm h20 - Advised patient to continue to wear CPAP appliance every night for min 4-6 hours or more - DME company is [employer] - FU in 1 year with Dr. SoHalford Chessmanr APP         Patient Instructions by WaMartyn EhrichNP at 06/12/2020 10:30 AM  Author: WaMartyn EhrichNP Author Type: Nurse Practitioner Filed: 06/12/2020 10:59 AM  Note Status: Signed Cosign: Cosign Not Required Encounter Date: 06/12/2020  Editor: WaMartyn EhrichNP (Nurse Practitioner)               Asthma/COPD: - Continue Flovent 1 puffs twice daily - Use albuterol rescue inhaler 2 puffs every 4-6 hours as needed for shortness of breath/wheezing   OSA: - Continue to wear CPAP for 4-6 hours or more each night - Do not drive if experiencing excessive daytime fatigue or somnolence   Orders: - Change CPAP pressure 5-15cm h20   Follow-up: - 1 year with Dr. SoHalford Chessman    CPAP and BPAP Information CPAP and BPAP are methods of helping a person breathe with the use of air pressure. CPAP stands for "continuous positive airway pressure." BPAP stands for "bi-level positive airway pressure." In both methods, air is blown through your nose or mouth and into your air passages to help you breathe  well. CPAP and BPAP use different amounts of pressure to blow air. With CPAP, the amount of pressure stays the same while you breathe in and out. With BPAP, the amount of pressure is increased when you breathe in (inhale) so that you can take larger breaths. Your health care provider will recommend whether CPAP or BPAP would be more helpful for you. Why are CPAP and BPAP treatments used? CPAP or BPAP can be helpful if you have: Sleep apnea. Chronic obstructive pulmonary disease (COPD). Heart failure. Medical conditions that weaken the muscles of the chest including muscular dystrophy, or neurological diseases such as amyotrophic lateral sclerosis (ALS). Other problems that cause  breathing to be weak, abnormal, or difficult. CPAP is most commonly used for obstructive sleep apnea (OSA) to keep the airways from collapsing when the muscles relax during sleep. How is CPAP or BPAP administered? Both CPAP and BPAP are provided by a small machine with a flexible plastic tube that attaches to a plastic mask. You wear the mask. Air is blown through the mask into your nose or mouth. The amount of pressure that is used to blow the air can be adjusted on the machine. Your health care provider will determine the pressure setting that should be used based on your individual needs. When should CPAP or BPAP be used? In most cases, the mask only needs to be worn during sleep. Generally, the mask needs to be worn throughout the night and during any daytime naps. People with certain medical conditions may also need to wear the mask at other times when they are awake. Follow instructions from your health care provider about when to use the machine. What are some tips for using the mask?  Because the mask needs to be snug, some people feel trapped or closed-in (claustrophobic) when first using the mask. If you feel this way, you may need to get used to the mask. One way to do this is by holding the mask loosely over your nose or mouth and then gradually applying the mask more snugly. You can also gradually increase the amount of time that you use the mask. Masks are available in various types and sizes. Some fit over your mouth and nose while others fit over just your nose. If your mask does not fit well, talk with your health care provider about getting a different one. If you are using a mask that fits over your nose and you tend to breathe through your mouth, a chin strap may be applied to help keep your mouth closed. The CPAP and BPAP machines have alarms that may sound if the mask comes off or develops a leak. If you have trouble with the mask, it is very important that you talk with your health  care provider about finding a way to make the mask easier to tolerate. Do not stop using the mask. Stopping the use of the mask could have a negative impact on your health. What are some tips for using the machine? Place your CPAP or BPAP machine on a secure table or stand near an electrical outlet. Know where the on/off switch is located on the machine. Follow instructions from your health care provider about how to set the pressure on your machine and when you should use it. Do not eat or drink while the CPAP or BPAP machine is on. Food or fluids could get pushed into your lungs by the pressure of the CPAP or BPAP. Do not smoke. Tobacco smoke residue can  damage the machine. For home use, CPAP and BPAP machines can be rented or purchased through home health care companies. Many different brands of machines are available. Renting a machine before purchasing may help you find out which particular machine works well for you. Keep the CPAP or BPAP machine and attachments clean. Ask your health care provider for specific instructions. Get help right away if: You have redness or open areas around your nose or mouth where the mask fits. You have trouble using the CPAP or BPAP machine. You cannot tolerate wearing the CPAP or BPAP mask. You have pain, discomfort, and bloating in your abdomen. Summary CPAP and BPAP are methods of helping a person breathe with the use of air pressure. Both CPAP and BPAP are provided by a small machine with a flexible plastic tube that attaches to a plastic mask. If you have trouble with the mask, it is very important that you talk with your health care provider about finding a way to make the mask easier to tolerate. This information is not intended to replace advice given to you by your health care provider. Make sure you discuss any questions you have with your health care provider. Document Revised: 01/18/2019 Document Reviewed: 08/17/2016 Elsevier Patient Education   [place].          Orthostatic Vitals Recorded in This Encounter   06/12/2020  1032     BP Location: Left Arm  Cuff Size: Normal   Instructions    Return in about 1 year (around 06/12/2021).  Asthma/COPD: - Continue Flovent 1 puffs twice daily - Use albuterol rescue inhaler 2 puffs every 4-6 hours as needed for shortness of breath/wheezing   OSA: - Continue to wear CPAP for 4-6 hours or more each night - Do not drive if experiencing excessive daytime fatigue or somnolence   Orders: - Change CPAP pressure 5-15cm h20   Follow-up: - 1 year with Dr. Halford Chessman      CPAP and BPAP Information CPAP and BPAP are methods of helping a person breathe with the use of air pressure. CPAP stands for "continuous positive airway pressure." BPAP stands for "bi-level positive airway pressure." In both methods, air is blown through your nose or mouth and into your air passages to help you breathe well. CPAP and BPAP use different amounts of pressure to blow air. With CPAP, the amount of pressure stays the same while you breathe in and out. With BPAP, the amount of pressure is increased when you breathe in (inhale) so that you can take larger breaths. Your health care provider will recommend whether CPAP or BPAP would be more helpful for you. Why are CPAP and BPAP treatments used? CPAP or BPAP can be helpful if you have: Sleep apnea. Chronic obstructive pulmonary disease (COPD). Heart failure. Medical conditions that weaken the muscles of the chest including muscular dystrophy, or neurological diseases such as amyotrophic lateral sclerosis (ALS). Other problems that cause breathing to be weak, abnormal, or difficult. CPAP is most commonly used for obstructive sleep apnea (OSA) to keep the airways from collapsing when the muscles relax during sleep. How is CPAP or BPAP administered? Both CPAP and BPAP are provided by a small machine with a flexible plastic tube that attaches to a plastic  mask. You wear the mask. Air is blown through the mask into your nose or mouth. The amount of pressure that is used to blow the air can be adjusted on the machine. Your health care provider will determine  the pressure setting that should be used based on your individual needs. When should CPAP or BPAP be used? In most cases, the mask only needs to be worn during sleep. Generally, the mask needs to be worn throughout the night and during any daytime naps. People with certain medical conditions may also need to wear the mask at other times when they are awake. Follow instructions from your health care provider about when to use the machine. What are some tips for using the mask?  Because the mask needs to be snug, some people feel trapped or closed-in (claustrophobic) when first using the mask. If you feel this way, you may need to get used to the mask. One way to do this is by holding the mask loosely over your nose or mouth and then gradually applying the mask more snugly. You can also gradually increase the amount of time that you use the mask. Masks are available in various types and sizes. Some fit over your mouth and nose while others fit over just your nose. If your mask does not fit well, talk with your health care provider about getting a different one. If you are using a mask that fits over your nose and you tend to breathe through your mouth, a chin strap may be applied to help keep your mouth closed. The CPAP and BPAP machines have alarms that may sound if the mask comes off or develops a leak. If you have trouble with the mask, it is very important that you talk with your health care provider about finding a way to make the mask easier to tolerate. Do not stop using the mask. Stopping the use of the mask could have a negative impact on your health. What are some tips for using the machine? Place your CPAP or BPAP machine on a secure table or stand near an electrical outlet. Know where the  on/off switch is located on the machine. Follow instructions from your health care provider about how to set the pressure on your machine and when you should use it. Do not eat or drink while the CPAP or BPAP machine is on. Food or fluids could get pushed into your lungs by the pressure of the CPAP or BPAP. Do not smoke. Tobacco smoke residue can damage the machine. For home use, CPAP and BPAP machines can be rented or purchased through home health care companies. Many different brands of machines are available. Renting a machine before purchasing may help you find out which particular machine works well for you. Keep the CPAP or BPAP machine and attachments clean. Ask your health care provider for specific instructions. Get help right away if: You have redness or open areas around your nose or mouth where the mask fits. You have trouble using the CPAP or BPAP machine. You cannot tolerate wearing the CPAP or BPAP mask. You have pain, discomfort, and bloating in your abdomen. Summary CPAP and BPAP are methods of helping a person breathe with the use of air pressure. Both CPAP and BPAP are provided by a small machine with a flexible plastic tube that attaches to a plastic mask. If you have trouble with the mask, it is very important that you talk with your health care provider about finding a way to make the mask easier to tolerate. This information is not intended to replace advice given to you by your health care provider. Make sure you discuss any questions you have with your health care provider. Document Revised: 01/18/2019  Document Reviewed: 08/17/2016 Elsevier Patient Education  [employer].       Reason for call: Called on Friday, 05/17/21 with symptoms, symptoms are unchanged.  He continues to run a fever of 101.4 and taking tylenol to control fever.  He continues to have fatigue, body aches and mild sob.  He is using his albuterol inhaler for sob.  He has not taken the Paxlovid d/t  the risks per Dr. Melvyn Novas since the symptoms have not gotten worse.  His home covid test was inconclusive and was not advised to get a PCR test.  He has noticed some burning and then swelling in his groin area between where is scrotal sac and anus connect.  He thinks this is probably unrelated, but wanted to mention it just in case.  He is seeing his pcp tomorrow related to the swelling.  He denies any problems with urination or having bowel movements. Dr. Halford Chessman, Patient wants to know if he needs to get a PCR covid test and if he should just continue to monitor his symptoms and hold off on taking the paxlovid.  Please advise.  Thank you.  (examples of things to ask: : When did symptoms start? Fever? Cough? Productive? Color to sputum? More sputum than usual? Wheezing? Have you needed increased oxygen? Are you taking your respiratory medications? What over the counter measures have you tried?)  No Known Allergies  Immunization History  Administered Date(s) Administered   Fluad Quad(high Dose 65+) 06/21/2019, 07/10/2020   Influenza Whole 08/12/2006, 10/21/2007, 07/10/2008, 07/19/2009, 07/12/2012   Influenza, High Dose Seasonal PF 08/25/2018   Influenza, Seasonal, Injecte, Preservative Fre 08/04/2013   Influenza-Unspecified 08/16/2015, 08/15/2016, 08/29/2017   PFIZER Comirnaty(Gray Top)Covid-19 Tri-Sucrose Vaccine 02/19/2021   PFIZER(Purple Top)SARS-COV-2 Vaccination 11/19/2019, 12/13/2019, 08/01/2020, 08/02/2020   Pneumococcal Conjugate-13 01/17/2015   Pneumococcal Polysaccharide-23 03/14/2013   Td 10/09/2005   Zoster, Live 03/19/2009

## 2021-05-19 NOTE — Telephone Encounter (Signed)
Call returned to patient, confirmed DOB. Made aware of VS recommendations. Patient instructed to call his pharmacy and get a covid test and call us once he has the results. Made if symptoms worsen before then to call 911. Aware VS is recommending a OV but will need covid test prior. Depending on results visit will be either in person or via video/phone. Voiced understanding.   Nothing further needed at this time.  Will cc VS as FYI.

## 2021-05-19 NOTE — Telephone Encounter (Signed)
Needs to be scheduled for an office visit to assess further.

## 2021-05-20 ENCOUNTER — Ambulatory Visit (INDEPENDENT_AMBULATORY_CARE_PROVIDER_SITE_OTHER): Payer: Medicare Other | Admitting: Family Medicine

## 2021-05-20 ENCOUNTER — Encounter: Payer: Self-pay | Admitting: Family Medicine

## 2021-05-20 ENCOUNTER — Other Ambulatory Visit: Payer: Self-pay

## 2021-05-20 VITALS — BP 130/80 | HR 81 | Temp 99.2°F | Wt 255.2 lb

## 2021-05-20 DIAGNOSIS — R234 Changes in skin texture: Secondary | ICD-10-CM

## 2021-05-20 DIAGNOSIS — Z20822 Contact with and (suspected) exposure to covid-19: Secondary | ICD-10-CM | POA: Diagnosis not present

## 2021-05-20 NOTE — Patient Instructions (Signed)
Suspect you may be forming early abscess left buttock  However, this is very indurated (hard) and not fluctuant  Recommend warm sitz baths two times daily  This may come to a head and need to be drained in a couple of days.

## 2021-05-20 NOTE — Progress Notes (Signed)
Established Patient Office Visit  Subjective:  Patient ID: Guy Moore, male    DOB: Oct 02, 1934  Age: 87 y.o. MRN: [id number]  CC:  Chief Complaint  Patient presents with   Rash    X 1 week, in the scrotum area, very itchy, feels swollen, getting worse    HPI Guy Moore presents for chief complaint of "rash "left perineum.  Actually he has more of a soreness in the area localized left buttock near the anal region.  Has not seen any drainage.  He tried some hydrocortisone cream but did not see any improvement.  He is unable to see the area because of location.   He actually did develop some fever Friday but apparently had some respiratory symptoms and had headache and mild cough and dyspnea.  He did COVID testing this morning at pharmacy and that is pending.  He does have multiple chronic problems including history of hypertension, atrial fibrillation, COPD, obstructive sleep apnea  Past Medical History:  Diagnosis Date   Allergic rhinitis    Arthritis    Asthma    Atrial fibrillation ([place])    sees Dr. [name] at [facility]    Atrial flutter ([place])    BCC (basal cell carcinoma of skin)    Nose   COPD with emphysema ([place])    sees Dr. [name]    Depression    Diabetes mellitus ([place])    Diet control    Diverticulosis    Erosive esophagitis    Esophageal stricture    Gastric ulcer    Hearing loss    uses amplification   Hemorrhoids    Hiatal hernia    HTN (hypertension)    Hyperlipidemia    Knee problem    2% permanent partial impairment Right   Macular degeneration    Sleep apnea     Past Surgical History:  Procedure Laterality Date   CARDIAC ELECTROPHYSIOLOGY STUDY AND ABLATION     x 4 (sees Dr. [name] at [facility])   [place]  1980's   VASECTOMY  [id number]   w/complications    Family History  Problem Relation Age of Onset   Stroke Mother    Breast cancer Mother    Heart disease  Father    Hypertension Father    Cancer Father        Bladder   Breast cancer Brother    Atrial fibrillation Brother    Tremor Brother    Stroke Brother    Colon cancer Neg Hx     Social History   Socioeconomic History   Marital status: Married    Spouse name: Not on file   Number of children: Not on file   Years of education: Not on file   Highest education level: Not on file  Occupational History   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 He does not want heroic or futile care especially if he cannon communicate, read and enjoy his loved ones.     Social Determinants of Health   Financial Resource Strain: Low Risk    Difficulty of Paying Living Expenses: Not hard at all  Food Insecurity: No Food Insecurity   Worried About Charity fundraiser in the Last Year: Never true   [place] in the Last Year: Never true  Transportation Needs: No Transportation Needs   Lack of Transportation (Medical): No   Lack of Transportation (Non-Medical): No  Physical Activity: Inactive   Days of Exercise per Week: 0 days   Minutes of Exercise per Session: 0 min  Stress: No Stress Concern Present   Feeling of Stress : Not at all  Social Connections: Moderately Integrated   Frequency of Communication with Friends and Family: More than three times a week   Frequency of Social Gatherings with Friends  and Family: Once a week   Attends Religious Services: 1 to 4 times per year   Active Member of [employer] or Organizations: No   Attends Music therapist: Never   Marital Status: Married  Human resources officer Violence: Not At Risk   Fear of Current or Ex-Partner: No   Emotionally Abused: No   Physically Abused: No   Sexually Abused: No    Outpatient Medications Prior to Visit  Medication Sig Dispense Refill   acetaminophen (TYLENOL) 500 MG tablet Take 500-1,000 mg by mouth every 6 (six) hours as needed (for pain).     albuterol (VENTOLIN HFA) 108 (90 Base) MCG/ACT inhaler USE 2 INHALATIONS EVERY 6 HOURS AS NEEDED FOR WHEEZING OR SHORTNESS OF BREATH 54 g 3   amLODipine (NORVASC) 5 MG tablet TAKE 1 TABLET DAILY 90 tablet 3   BESIVANCE 0.6 % SUSP SMARTSIG:1 In Eye(s)     fluticasone (FLOVENT DISKUS) 50 MCG/BLIST diskus inhaler Inhale 1 puff into the lungs 2 (two) times daily. 1 each 0   MAGNESIUM GLUCONATE PO Take 1 tablet by mouth daily.     Methylcellulose, Laxative, (CITRUCEL PO) Take 1 tablet by mouth daily after breakfast.     Multiple Vitamins-Minerals (PRESERVISION AREDS 2) CAPS Take 1 capsule by mouth 2 (two) times daily.     nirmatrelvir/ritonavir EUA (PAXLOVID) TABS Take 3 tablets by mouth 2 (two) times daily for 5 days. Reduce xarelto by one half while on this medication 30 tablet 0   omeprazole (PRILOSEC) 40 MG capsule TAKE 1 CAPSULE DAILY 90 capsule 3   potassium chloride SA (KLOR-CON) 20 MEQ tablet TAKE 1 TABLET DAILY 90 tablet 3   pravastatin (PRAVACHOL) 40 MG tablet TAKE 1 TABLET AT BEDTIME 90 tablet 3   valsartan (DIOVAN) 160 MG tablet TAKE 1 TABLET DAILY 90 tablet 3   XARELTO 20 MG TABS tablet TAKE 1 TABLET DAILY 90 tablet 0   No facility-administered medications prior to visit.    No Known Allergies  ROS Review of Systems  Constitutional:  Negative for chills and fever.     Objective:    Physical Exam Vitals reviewed.  Constitutional:      Appearance:  Normal appearance.  Cardiovascular:     Rate and Rhythm: Normal rate.  Skin:    Comments: Does not have any visible rash in the groin region.  He does have approximately 2 to 3 cm  somewhat oval-shaped area of mild erythema left buttock about 3 cm away from the anus and this is indurated and minimally tender.  There is no fluctuance whatsoever.  Neurological:     Mental Status: He is alert.    BP 130/80 (BP Location: Left Arm, Patient Position: Sitting, Cuff Size: Normal)   Pulse 81   Temp 99.2 F (37.3 C) (Oral)   Wt 255 lb 3.2 oz (115.8 kg)   SpO2 98%   BMI 36.62 kg/m  Wt Readings from Last 3 Encounters:  05/20/21 255 lb 3.2 oz (115.8 kg)  02/24/21 254 lb 11.2 oz (115.5 kg)  08/26/20 250 lb 4.8 oz (113.5 kg)     Health Maintenance Due  Topic Date Due   Zoster Vaccines- Shingrix (1 of 2) Never done   OPHTHALMOLOGY EXAM  06/26/2010   FOOT EXAM  06/28/2015   TETANUS/TDAP  10/10/2015   HEMOGLOBIN A1C  08/22/2020   INFLUENZA VACCINE  05/12/2021    There are no preventive care reminders to display for this patient.  Lab Results  Component Value Date   TSH 1.56 02/20/2020   Lab Results  Component Value Date   WBC 3.3 (L) 02/24/2021   HGB 13.7 02/24/2021   HCT 40.8 02/24/2021   MCV 89.1 02/24/2021   PLT 104 (L) 02/24/2021   Lab Results  Component Value Date   NA 141 02/24/2021   K 4.6 02/24/2021   CO2 27 02/24/2021   GLUCOSE 94 02/24/2021   BUN 21 02/24/2021   CREATININE 1.07 02/24/2021   BILITOT 0.9 02/24/2021   ALKPHOS 44 02/24/2021   AST 18 02/24/2021   ALT 14 02/24/2021   PROT 6.7 02/24/2021   ALBUMIN 4.0 02/24/2021   CALCIUM 9.4 02/24/2021   ANIONGAP 8 02/24/2021   GFR 76.41 02/20/2020   Lab Results  Component Value Date   CHOL 158 06/26/2020   Lab Results  Component Value Date   HDL 60 06/26/2020   Lab Results  Component Value Date   LDLCALC 87 06/26/2020   Lab Results  Component Value Date   TRIG 39 06/26/2020   Lab Results  Component  Value Date   CHOLHDL 2.6 06/26/2020   Lab Results  Component Value Date   HGBA1C 6.3 02/20/2020      Assessment & Plan:   Possible early abscess left buttock.  He has small area of induration but no fluctuance.  Given the fact this is indurated we recommended sitz baths at least twice daily and reassess in a couple days.  We explained this may come to a head and still need to be drained.  He does not have any surrounding cellulitis changes.  Follow-up promptly for any fever, progressive redness, progressive swelling, or worsening pain.    Follow-up: No follow-ups on file.    Carolann Littler, MD

## 2021-05-22 ENCOUNTER — Telehealth: Payer: Self-pay | Admitting: Pulmonary Disease

## 2021-05-22 NOTE — Telephone Encounter (Signed)
Called and spoke to pt. Pt has been DB with Dr. Halford Chessman on 8/15. Pt verbalized understanding and denied any further questions or concerns at this time.    Will send to Dr. Halford Chessman as Juluis Rainier.

## 2021-05-22 NOTE — Telephone Encounter (Signed)
Can double book him for visit with me or schedule ROV with NP.

## 2021-05-22 NOTE — Telephone Encounter (Signed)
Pt is returning phone call and is needing to have an appt scheduled with Dr. Halford Chessman; unable to double book so may a nurse call him and double book with him please. Pls regard; [phone].

## 2021-05-22 NOTE — Telephone Encounter (Signed)
ATC patient unable to reach  Please double book VS in a follow up slot for next week for patient.  No openings for NP's  Thank you

## 2021-05-22 NOTE — Telephone Encounter (Signed)
Pt stated that he is unable to keep the 11:00 time frame for 05/26/2021 w/ Dr. Halford Chessman due to him having another health issue and was able to get an appt with [facility] for 11:30 a.m. on the same day and was wanting to see if he can have a later time or something for a different day. Pls regard; [phone].

## 2021-05-22 NOTE — Telephone Encounter (Signed)
Pt stated that per note that has now been closed from 05/19/2021 he did get a Covid-19 test done at [facility] and his results came back negative. Pt stated that if he doesn't take his albuterol inhaler he still very SOB he said that he gets out of breathe by just walking up and down his stairs so he still wants to see Dr. Halford Chessman. I do see per Dr. Halford Chessman he wanted him to have an appt scheduled with him pending Covid-19 results but Dr. Halford Chessman currently does not have anything available until 07/02/2021 and the pt is currently scheduled for a 1 year follow up appt on 06/20/2021 but pt is wanting to be seen sooner. Did inform pt that a nurse would contact him back if Dr. Halford Chessman was able to double book.   Pls regard; [phone].

## 2021-05-22 NOTE — Telephone Encounter (Signed)
Call made to patient, confirmed DOB. Patient states he was not able to make the appt scheduled for 8/15 due to a newly diagnosed rectal abscess. Patient already has an appt scheduled for 06/20/21 that he is going to keep. He states his symptoms has mostly subsided so he would rather have his other health issues addressed first and if he feels like he needs to be seen sooner than September he will give [place] a call.   Nothing further needed at this time.

## 2021-05-24 ENCOUNTER — Encounter (HOSPITAL_COMMUNITY): Payer: Self-pay | Admitting: Emergency Medicine

## 2021-05-24 ENCOUNTER — Inpatient Hospital Stay (HOSPITAL_COMMUNITY)
Admission: EM | Admit: 2021-05-24 | Discharge: 2021-05-28 | DRG: 988 | Disposition: A | Discharge status: Home / Self Care | Payer: Medicare Other | Attending: Internal Medicine | Admitting: Internal Medicine

## 2021-05-24 ENCOUNTER — Other Ambulatory Visit: Payer: Self-pay

## 2021-05-24 ENCOUNTER — Emergency Department (HOSPITAL_COMMUNITY)
Admission: EM | Admit: 2021-05-24 | Discharge: 2021-05-24 | Disposition: A | Discharge status: Home / Self Care | Payer: Medicare Other | Source: Home / Self Care | Attending: Emergency Medicine | Admitting: Emergency Medicine

## 2021-05-24 DIAGNOSIS — I4891 Unspecified atrial fibrillation: Secondary | ICD-10-CM | POA: Diagnosis present

## 2021-05-24 DIAGNOSIS — Z87891 Personal history of nicotine dependence: Secondary | ICD-10-CM | POA: Insufficient documentation

## 2021-05-24 DIAGNOSIS — J432 Centrilobular emphysema: Principal | ICD-10-CM | POA: Diagnosis present

## 2021-05-24 DIAGNOSIS — L02215 Cutaneous abscess of perineum: Secondary | ICD-10-CM | POA: Insufficient documentation

## 2021-05-24 DIAGNOSIS — R531 Weakness: Secondary | ICD-10-CM | POA: Diagnosis not present

## 2021-05-24 DIAGNOSIS — Z7952 Long term (current) use of systemic steroids: Secondary | ICD-10-CM | POA: Insufficient documentation

## 2021-05-24 DIAGNOSIS — I10 Essential (primary) hypertension: Secondary | ICD-10-CM | POA: Diagnosis not present

## 2021-05-24 DIAGNOSIS — Z8719 Personal history of other diseases of the digestive system: Secondary | ICD-10-CM

## 2021-05-24 DIAGNOSIS — E785 Hyperlipidemia, unspecified: Secondary | ICD-10-CM | POA: Diagnosis present

## 2021-05-24 DIAGNOSIS — I482 Chronic atrial fibrillation, unspecified: Secondary | ICD-10-CM | POA: Diagnosis present

## 2021-05-24 DIAGNOSIS — R062 Wheezing: Secondary | ICD-10-CM | POA: Insufficient documentation

## 2021-05-24 DIAGNOSIS — Z7901 Long term (current) use of anticoagulants: Secondary | ICD-10-CM | POA: Insufficient documentation

## 2021-05-24 DIAGNOSIS — R0602 Shortness of breath: Secondary | ICD-10-CM | POA: Diagnosis not present

## 2021-05-24 DIAGNOSIS — R0600 Dyspnea, unspecified: Secondary | ICD-10-CM

## 2021-05-24 DIAGNOSIS — Z85828 Personal history of other malignant neoplasm of skin: Secondary | ICD-10-CM

## 2021-05-24 DIAGNOSIS — R61 Generalized hyperhidrosis: Secondary | ICD-10-CM | POA: Diagnosis not present

## 2021-05-24 DIAGNOSIS — E119 Type 2 diabetes mellitus without complications: Secondary | ICD-10-CM | POA: Insufficient documentation

## 2021-05-24 DIAGNOSIS — M1990 Unspecified osteoarthritis, unspecified site: Secondary | ICD-10-CM | POA: Diagnosis present

## 2021-05-24 DIAGNOSIS — G4733 Obstructive sleep apnea (adult) (pediatric): Secondary | ICD-10-CM | POA: Diagnosis present

## 2021-05-24 DIAGNOSIS — R231 Pallor: Secondary | ICD-10-CM | POA: Diagnosis not present

## 2021-05-24 DIAGNOSIS — J45909 Unspecified asthma, uncomplicated: Secondary | ICD-10-CM | POA: Insufficient documentation

## 2021-05-24 DIAGNOSIS — Z20822 Contact with and (suspected) exposure to covid-19: Secondary | ICD-10-CM | POA: Diagnosis not present

## 2021-05-24 DIAGNOSIS — J449 Chronic obstructive pulmonary disease, unspecified: Secondary | ICD-10-CM | POA: Diagnosis present

## 2021-05-24 DIAGNOSIS — J189 Pneumonia, unspecified organism: Secondary | ICD-10-CM | POA: Diagnosis present

## 2021-05-24 DIAGNOSIS — J4489 Other specified chronic obstructive pulmonary disease: Secondary | ICD-10-CM | POA: Diagnosis present

## 2021-05-24 DIAGNOSIS — Z8711 Personal history of peptic ulcer disease: Secondary | ICD-10-CM

## 2021-05-24 DIAGNOSIS — Z79899 Other long term (current) drug therapy: Secondary | ICD-10-CM | POA: Insufficient documentation

## 2021-05-24 DIAGNOSIS — R5383 Other fatigue: Secondary | ICD-10-CM | POA: Diagnosis not present

## 2021-05-24 DIAGNOSIS — Z803 Family history of malignant neoplasm of breast: Secondary | ICD-10-CM

## 2021-05-24 DIAGNOSIS — R58 Hemorrhage, not elsewhere classified: Secondary | ICD-10-CM | POA: Diagnosis not present

## 2021-05-24 DIAGNOSIS — Z823 Family history of stroke: Secondary | ICD-10-CM

## 2021-05-24 DIAGNOSIS — Z8249 Family history of ischemic heart disease and other diseases of the circulatory system: Secondary | ICD-10-CM

## 2021-05-24 DIAGNOSIS — I499 Cardiac arrhythmia, unspecified: Secondary | ICD-10-CM | POA: Diagnosis not present

## 2021-05-24 DIAGNOSIS — L0231 Cutaneous abscess of buttock: Secondary | ICD-10-CM

## 2021-05-24 LAB — CBC WITH DIFFERENTIAL/PLATELET
Abs Immature Granulocytes: 0.02 10*3/uL (ref 0.00–0.07)
Basophils Absolute: 0 10*3/uL (ref 0.0–0.1)
Basophils Relative: 1 %
Eosinophils Absolute: 0.1 10*3/uL (ref 0.0–0.5)
Eosinophils Relative: 2 %
HCT: 41.4 % (ref 39.0–52.0)
Hemoglobin: 13.3 g/dL (ref 13.0–17.0)
Immature Granulocytes: 0 %
Lymphocytes Relative: 7 %
Lymphs Abs: 0.3 10*3/uL — ABNORMAL LOW (ref 0.7–4.0)
MCH: 29 pg (ref 26.0–34.0)
MCHC: 32.1 g/dL (ref 30.0–36.0)
MCV: 90.2 fL (ref 80.0–100.0)
Monocytes Absolute: 0.4 10*3/uL (ref 0.1–1.0)
Monocytes Relative: 8 %
Neutro Abs: 3.8 10*3/uL (ref 1.7–7.7)
Neutrophils Relative %: 82 %
Platelets: 164 10*3/uL (ref 150–400)
RBC: 4.59 MIL/uL (ref 4.22–5.81)
RDW: 13.8 % (ref 11.5–15.5)
WBC: 4.7 10*3/uL (ref 4.0–10.5)
nRBC: 0 % (ref 0.0–0.2)

## 2021-05-24 LAB — COMPREHENSIVE METABOLIC PANEL
ALT: 17 U/L (ref 0–44)
AST: 18 U/L (ref 15–41)
Albumin: 3.6 g/dL (ref 3.5–5.0)
Alkaline Phosphatase: 51 U/L (ref 38–126)
Anion gap: 12 (ref 5–15)
BUN: 11 mg/dL (ref 8–23)
CO2: 25 mmol/L (ref 22–32)
Calcium: 8.9 mg/dL (ref 8.9–10.3)
Chloride: 101 mmol/L (ref 98–111)
Creatinine, Ser: 0.95 mg/dL (ref 0.61–1.24)
GFR, Estimated: >60 mL/min (ref >60)
Glucose, Bld: 114 mg/dL — ABNORMAL HIGH (ref 70–99)
Potassium: 3.6 mmol/L (ref 3.5–5.1)
Sodium: 138 mmol/L (ref 135–145)
Total Bilirubin: 1 mg/dL (ref 0.3–1.2)
Total Protein: 6.6 g/dL (ref 6.5–8.1)

## 2021-05-24 LAB — LACTIC ACID, PLASMA: Lactic Acid, Venous: 1.8 mmol/L (ref 0.5–1.9)

## 2021-05-24 MED ORDER — DOXYCYCLINE HYCLATE 100 MG PO CAPS: 100.0000 mg | ORAL_CAPSULE | Freq: Two times a day (BID) | ORAL | 0 refills | Status: DC

## 2021-05-24 MED ORDER — IPRATROPIUM-ALBUTEROL 0.5-2.5 (3) MG/3ML IN SOLN
3.0000 mL | Freq: Once | RESPIRATORY_TRACT | Status: DC
  Filled 2021-05-24: qty 3

## 2021-05-24 MED ORDER — LIDOCAINE HCL (PF) 1 % IJ SOLN
INTRAMUSCULAR | Status: AC
  Filled 2021-05-24: qty 30

## 2021-05-24 MED ORDER — DOXYCYCLINE HYCLATE 100 MG PO TABS
100.0000 mg | ORAL_TABLET | Freq: Once | ORAL | Status: AC
  Administered 2021-05-24: 100 mg via ORAL
  Filled 2021-05-24: qty 1

## 2021-05-24 MED ORDER — OXYCODONE HCL 5 MG PO TABS
5.0000 mg | ORAL_TABLET | Freq: Once | ORAL | Status: AC
  Administered 2021-05-24: 5 mg via ORAL
  Filled 2021-05-24: qty 1

## 2021-05-24 MED ORDER — LIDOCAINE-EPINEPHRINE (PF) 2 %-1:200000 IJ SOLN: 10.0000 mL | Freq: Once | INTRAMUSCULAR | Status: DC

## 2021-05-24 NOTE — Discharge Instructions (Addendum)
Keep the abscess and wound dry for the next 2 days.  You should avoid soaking in a tub.  After 2 days you can shower normally.  There is a piece of packing paper that was placed in your wound to keep it open and draining.  We will continue draining over the next few days.  The packing paper needs to be removed by the end of the week (Friday).  This could be done in the colorectal clinic, or else with your primary care doctor in the office.  Your next dose of antibiotic will be tomorrow morning.  Please complete the full 10-day course, this is to prevent infection.  You can change your bandages every 24 hours for the next 3 days.  After that, you should be able to leave the wound open to air.  I would recommend wearing Depends diapers or throw away underwear, as you will likely continue having some bleeding and oozing over the next couple days.  If you being having sudden worsening pain near the site of your abscess, or fevers and chills at home, please call your doctor or return to the emergency department.

## 2021-05-24 NOTE — ED Provider Notes (Signed)
[facility] EMERGENCY DEPARTMENT Provider Note   CSN: [id number] Arrival date & time: 05/24/21  1412     History CC - Abscess   Guy Moore is a 87 y.o. male.  HPI  87 year old male with a past medical history of atrial fibrillation on anticoagulation, COPD, diabetes, hemorrhoids, hypertension presenting to the emergency department with perineal pain and swelling x1 week.  Patient reports that sometime last week, he had a fever and was diagnosed with a viral illness.  He states that he did tested negative for COVID, but tested positive for influenza.  He states that as he was recovering from that illness, he developed a pain and swelling around his bottom.  He states that it does not hurt to have a bowel movement, but he feels a bump just underneath his testicles.  He states that the spot has continued to grow and become more painful.  It is progressively worsening.  He denies any more fevers.  He denies any nausea, vomiting, or chills.  He saw a primary care provider on 8/09 was prescribed sitz bath's, but he states that this has not been efficacious at home.  His pain continues to worsen, so he presents back to the emergency department today.  Past Medical History:  Diagnosis Date   Allergic rhinitis    Arthritis    Asthma    Atrial fibrillation [facility])    sees Dr. [name] at [facility]    Atrial flutter ([place])    BCC (basal cell carcinoma of skin)    Nose   COPD with emphysema ([place])    sees Dr. [name]    Depression    Diabetes mellitus ([place])    Diet control    Diverticulosis    Erosive esophagitis    Esophageal stricture    Gastric ulcer    Hearing loss    uses amplification   Hemorrhoids    Hiatal hernia    HTN (hypertension)    Hyperlipidemia    Knee problem    2% permanent partial impairment Right   Macular degeneration    Sleep apnea     Patient Active Problem List   Diagnosis Date Noted   Bicytopenia 08/06/2020    B12 deficiency 05/06/2020   COPD with asthma ([place]) 03/08/2018   Asthma 08/26/2016   Hyperglycemia 05/29/2016   Thoracic scoliosis 12/16/2015   Thrombocytopenia ([place]) 04/12/2015   History of basal cell cancer 06/27/2014   Bleeding gums 04/15/2013   GERD (gastroesophageal reflux disease) 08/31/2011   Anticoagulant long-term use 08/31/2011   OBSTRUCTIVE SLEEP APNEA 07/04/2009   Atrial fibrillation ([place]) 04/19/2009   ALLERGIC RHINITIS 04/14/2007   Hyperlipidemia 04/13/2007   Essential hypertension 04/13/2007    Past Surgical History:  Procedure Laterality Date   CARDIAC ELECTROPHYSIOLOGY STUDY AND ABLATION     x 4 (sees Dr. [name] at [facility])   [place]  1980's   VASECTOMY  [id number]   w/complications       Family History  Problem Relation Age of Onset   Stroke Mother    Breast cancer Mother    Heart disease Father    Hypertension Father    Cancer Father        Bladder   Breast cancer Brother    Atrial fibrillation Brother    Tremor Brother    Stroke Brother    Colon cancer Neg Hx     Social History   Tobacco  Use   Smoking status: Former    Packs/day: 1.00    Years: 28.00    Pack years: 28.00    Types: Cigarettes    Quit date: 10/12/1977    Years since quitting: 43.6   Smokeless tobacco: Never  Substance Use Topics   Alcohol use: No    Alcohol/week: 0.0 standard drinks   Drug use: No    Home Medications Prior to Admission medications   Medication Sig Start Date End Date Taking? Authorizing Provider  doxycycline (VIBRAMYCIN) 100 MG capsule Take 1 capsule (100 mg total) by mouth 2 (two) times daily. 05/24/21  Yes Claud Kelp, MD  acetaminophen (TYLENOL) 500 MG tablet Take 500-1,000 mg by mouth every 6 (six) hours as needed (for pain).    [provider]  albuterol (VENTOLIN HFA) 108 (90 Base) MCG/ACT inhaler USE 2 INHALATIONS EVERY 6 HOURS AS NEEDED FOR WHEEZING OR SHORTNESS OF BREATH 02/03/21   [name], MD  amLODipine (NORVASC) 5 MG tablet TAKE 1 TABLET DAILY 07/08/20   Laurey Morale, MD  BESIVANCE 0.6 % SUSP SMARTSIG:1 In Eye(s) 07/24/20   [provider]  fluticasone (FLOVENT DISKUS) 50 MCG/BLIST diskus inhaler Inhale 1 puff into the lungs 2 (two) times daily. 02/28/21   [name], MD  MAGNESIUM GLUCONATE PO Take 1 tablet by mouth daily.    [provider]  Methylcellulose, Laxative, (CITRUCEL PO) Take 1 tablet by mouth daily after breakfast.    [provider]  Multiple Vitamins-Minerals (PRESERVISION AREDS 2) CAPS Take 1 capsule by mouth 2 (two) times daily.    [provider]  omeprazole (PRILOSEC) 40 MG capsule TAKE 1 CAPSULE DAILY 09/18/20   Laurey Morale, MD  potassium chloride SA (KLOR-CON) 20 MEQ tablet TAKE 1 TABLET DAILY 07/08/20   Laurey Morale, MD  pravastatin (PRAVACHOL) 40 MG tablet TAKE 1 TABLET AT BEDTIME 09/10/20   Laurey Morale, MD  valsartan (DIOVAN) 160 MG tablet TAKE 1 TABLET DAILY 09/18/20   Laurey Morale, MD  XARELTO 20 MG TABS tablet TAKE 1 TABLET DAILY 04/15/21   Laurey Morale, MD    Allergies    Patient has no known allergies.  Review of Systems   Review of Systems  Constitutional:  Negative for chills and fever.  HENT:  Negative for ear pain and sore throat.   Eyes:  Negative for pain and visual disturbance.  Respiratory:  Negative for cough and shortness of breath.   Cardiovascular:  Negative for chest pain and palpitations.  Gastrointestinal:  Negative for abdominal pain and vomiting.  Genitourinary:  Negative for dysuria, hematuria, penile discharge, penile pain, penile swelling, scrotal swelling and testicular pain.       Positive for perineal pain  Musculoskeletal:  Negative for arthralgias and back pain.  Skin:  Negative for color change and rash.  Neurological:  Negative for seizures and syncope.  All other systems reviewed and are negative.  Physical Exam Updated Vital Signs BP 135/77   Pulse 75    Temp 97.9 F (36.6 C) (Oral)   Resp 13   SpO2 95%   Physical Exam Vitals and nursing note reviewed.  Constitutional:      General: He is not in acute distress.    Appearance: Normal appearance. He is well-developed and normal weight. He is not toxic-appearing.  HENT:     Head: Normocephalic and atraumatic.  Eyes:     Conjunctiva/sclera: Conjunctivae normal.  Cardiovascular:     Rate and  Rhythm: Normal rate and regular rhythm.  Pulmonary:     Effort: Pulmonary effort is normal. No respiratory distress.     Breath sounds: No stridor. Wheezing (diffuse, end expiratory) present. No rhonchi or rales.  Abdominal:     Palpations: Abdomen is soft.     Tenderness: There is no abdominal tenderness.  Genitourinary:    Comments: Erythema around the urethral meatus, appears wet.  No rashes or lesions.  No inguinal hernias.  Scrotum is not swollen, the testes are nontender, there is no scrotal ecchymosis or erythema.  In the midline perineum anterior to the anus and posterior to the scrotum, there is an area of erythema, tenderness, and fluctuance.  This is an easily palpable, discrete area.  No perianal pain. Musculoskeletal:     Cervical back: Neck supple.  Skin:    General: Skin is warm and dry.  Neurological:     Mental Status: He is alert.    ED Results / Procedures / Treatments   Labs (all labs ordered are listed, but only abnormal results are displayed) Labs Reviewed  COMPREHENSIVE METABOLIC PANEL - Abnormal; Notable for the following components:      Result Value   Glucose, Bld 114 (*)    All other components within normal limits  CBC WITH DIFFERENTIAL/PLATELET - Abnormal; Notable for the following components:   Lymphs Abs 0.3 (*)    All other components within normal limits  LACTIC ACID, PLASMA    EKG None  Radiology No results found.  Procedures .Marland KitchenIncision and Drainage  Date/Time: 05/24/2021 7:38 PM Performed by: Claud Kelp, MD Authorized by: Wyvonnia Dusky,  MD   Consent:    Consent obtained:  Verbal   Consent given by:  Patient   Risks, benefits, and alternatives were discussed: yes     Risks discussed:  Bleeding, incomplete drainage, pain, infection and damage to other organs   Alternatives discussed:  Alternative treatment and referral Universal protocol:    Procedure explained and questions answered to patient or proxy's satisfaction: yes     Immediately prior to procedure, a time out was called: yes     Patient identity confirmed:  Verbally with patient Location:    Type:  Abscess   Size:  4 x 3 cm   Location:  Anogenital   Anogenital location:  Perineum Pre-procedure details:    Skin preparation:  Chlorhexidine with alcohol Anesthesia:    Anesthesia method:  Local infiltration   Local anesthetic:  Lidocaine 2% WITH epi Procedure type:    Complexity:  Complex Procedure details:    Ultrasound guidance: yes     Incision types:  Single straight   Incision depth:  Dermal   Wound management:  Probed and deloculated, irrigated with saline and extensive cleaning   Drainage:  Purulent   Drainage amount:  Copious   Wound treatment:  Wound left open   Packing materials:  1/4 in iodoform gauze Post-procedure details:    Procedure completion:  Tolerated well, no immediate complications   Medications Ordered in ED Medications  ipratropium-albuterol (DUONEB) 0.5-2.5 (3) MG/3ML nebulizer solution 3 mL (3 mLs Nebulization Patient Refused/Not Given 05/24/21 1642)  lidocaine-EPINEPHrine (XYLOCAINE W/EPI) 2 %-1:200000 (PF) injection 10 mL (has no administration in time range)  oxyCODONE (Oxy IR/ROXICODONE) immediate release tablet 5 mg (5 mg Oral Given 05/24/21 1639)  doxycycline (VIBRA-TABS) tablet 100 mg (100 mg Oral Given 05/24/21 1639)  lidocaine (PF) (XYLOCAINE) 1 % injection (  Given 05/24/21 1740)    ED Course  I have reviewed the triage vital signs and the nursing notes.  Pertinent labs & imaging results that were available during  my care of the patient were reviewed by me and considered in my medical decision making (see chart for details).  MDM Rules/Calculators/A&P                           87 year old male with above past medical history presenting with progressively worsening swelling, pain, and redness to the perineum.  Vital signs reviewed, patient is mildly hypertensive but otherwise within acceptable limits.  Physical exam is notable for a discrete palpable area of induration and fluctuance to the perineum.  There is no scrotal involvement, no ecchymosis, I have no concern for a scrotal abscess or Fournier's gangrene.  The abscess also does not appear to involve the anal verge or rectum, he has no history of fistulizing disease, I have no concern for a deeper perianal abscess.  Point-of-care bedside ultrasound performed, there is a well-circumscribed area of hypoechoic material concerning for purulence, it is superficial and goes to a depth of around 3.5 cm.  I believe it is amenable to bedside drainage.  Will provide a dose of p.o. antibiotics, p.o. analgesia, and then perform incision and drainage.  Incision and drainage performed as above.  The abscess pocket was completely evaluated with blunt forceps and probed, flushed and then packed.  I believe that he is stable for discharge with oral antibiotics and surgery follow-up.  Patient expresses understanding and agreement with the plan.  I discussed strict return precautions with him and his wife at bedside. Final Clinical Impression(s) / ED Diagnoses Final diagnoses:  Perineal abscess    Rx / DC Orders ED Discharge Orders          Ordered    doxycycline (VIBRAMYCIN) 100 MG capsule  2 times daily        05/24/21 1617             Claud Kelp, MD 05/24/21 1940    Wyvonnia Dusky, MD 05/24/21 [id number]

## 2021-05-24 NOTE — ED Triage Notes (Signed)
Pt reports abscess with pain and swelling to scrotum x 1 1/2 weeks.  Denies fever and chills.  Seen at PCP office on 8/9 for same.

## 2021-05-25 ENCOUNTER — Inpatient Hospital Stay (HOSPITAL_COMMUNITY): Payer: Medicare Other

## 2021-05-25 ENCOUNTER — Encounter (HOSPITAL_COMMUNITY): Payer: Self-pay | Admitting: Family Medicine

## 2021-05-25 ENCOUNTER — Other Ambulatory Visit: Payer: Self-pay

## 2021-05-25 ENCOUNTER — Emergency Department (HOSPITAL_COMMUNITY): Payer: Medicare Other

## 2021-05-25 DIAGNOSIS — Z85828 Personal history of other malignant neoplasm of skin: Secondary | ICD-10-CM | POA: Diagnosis not present

## 2021-05-25 DIAGNOSIS — R0602 Shortness of breath: Secondary | ICD-10-CM | POA: Diagnosis not present

## 2021-05-25 DIAGNOSIS — G4733 Obstructive sleep apnea (adult) (pediatric): Secondary | ICD-10-CM | POA: Diagnosis present

## 2021-05-25 DIAGNOSIS — Z803 Family history of malignant neoplasm of breast: Secondary | ICD-10-CM | POA: Diagnosis not present

## 2021-05-25 DIAGNOSIS — J9811 Atelectasis: Secondary | ICD-10-CM | POA: Diagnosis not present

## 2021-05-25 DIAGNOSIS — R0600 Dyspnea, unspecified: Secondary | ICD-10-CM

## 2021-05-25 DIAGNOSIS — E785 Hyperlipidemia, unspecified: Secondary | ICD-10-CM | POA: Diagnosis present

## 2021-05-25 DIAGNOSIS — Z7901 Long term (current) use of anticoagulants: Secondary | ICD-10-CM | POA: Diagnosis not present

## 2021-05-25 DIAGNOSIS — J189 Pneumonia, unspecified organism: Secondary | ICD-10-CM | POA: Diagnosis present

## 2021-05-25 DIAGNOSIS — Z823 Family history of stroke: Secondary | ICD-10-CM | POA: Diagnosis not present

## 2021-05-25 DIAGNOSIS — Z87891 Personal history of nicotine dependence: Secondary | ICD-10-CM | POA: Diagnosis not present

## 2021-05-25 DIAGNOSIS — I480 Paroxysmal atrial fibrillation: Secondary | ICD-10-CM | POA: Diagnosis not present

## 2021-05-25 DIAGNOSIS — I700 Atherosclerosis of aorta: Secondary | ICD-10-CM | POA: Diagnosis not present

## 2021-05-25 DIAGNOSIS — J441 Chronic obstructive pulmonary disease with (acute) exacerbation: Secondary | ICD-10-CM | POA: Diagnosis not present

## 2021-05-25 DIAGNOSIS — R531 Weakness: Secondary | ICD-10-CM | POA: Diagnosis not present

## 2021-05-25 DIAGNOSIS — M1990 Unspecified osteoarthritis, unspecified site: Secondary | ICD-10-CM | POA: Diagnosis present

## 2021-05-25 DIAGNOSIS — Z8711 Personal history of peptic ulcer disease: Secondary | ICD-10-CM | POA: Diagnosis not present

## 2021-05-25 DIAGNOSIS — K611 Rectal abscess: Secondary | ICD-10-CM | POA: Diagnosis not present

## 2021-05-25 DIAGNOSIS — J432 Centrilobular emphysema: Secondary | ICD-10-CM | POA: Diagnosis present

## 2021-05-25 DIAGNOSIS — J439 Emphysema, unspecified: Secondary | ICD-10-CM | POA: Diagnosis not present

## 2021-05-25 DIAGNOSIS — Z20822 Contact with and (suspected) exposure to covid-19: Secondary | ICD-10-CM | POA: Diagnosis present

## 2021-05-25 DIAGNOSIS — Z8249 Family history of ischemic heart disease and other diseases of the circulatory system: Secondary | ICD-10-CM | POA: Diagnosis not present

## 2021-05-25 DIAGNOSIS — I482 Chronic atrial fibrillation, unspecified: Secondary | ICD-10-CM | POA: Diagnosis present

## 2021-05-25 DIAGNOSIS — L0231 Cutaneous abscess of buttock: Secondary | ICD-10-CM | POA: Diagnosis not present

## 2021-05-25 DIAGNOSIS — L02215 Cutaneous abscess of perineum: Secondary | ICD-10-CM | POA: Diagnosis present

## 2021-05-25 DIAGNOSIS — E119 Type 2 diabetes mellitus without complications: Secondary | ICD-10-CM | POA: Diagnosis present

## 2021-05-25 DIAGNOSIS — Z8719 Personal history of other diseases of the digestive system: Secondary | ICD-10-CM | POA: Diagnosis not present

## 2021-05-25 DIAGNOSIS — J449 Chronic obstructive pulmonary disease, unspecified: Secondary | ICD-10-CM | POA: Diagnosis not present

## 2021-05-25 DIAGNOSIS — I10 Essential (primary) hypertension: Secondary | ICD-10-CM | POA: Diagnosis present

## 2021-05-25 LAB — PROCALCITONIN: Procalcitonin: <0.1 ng/mL

## 2021-05-25 LAB — CBC WITH DIFFERENTIAL/PLATELET
Abs Immature Granulocytes: 0.03 10*3/uL (ref 0.00–0.07)
Basophils Absolute: 0 10*3/uL (ref 0.0–0.1)
Basophils Relative: 0 %
Eosinophils Absolute: 0 10*3/uL (ref 0.0–0.5)
Eosinophils Relative: 1 %
HCT: 37.7 % — ABNORMAL LOW (ref 39.0–52.0)
Hemoglobin: 12.7 g/dL — ABNORMAL LOW (ref 13.0–17.0)
Immature Granulocytes: 1 %
Lymphocytes Relative: 7 %
Lymphs Abs: 0.3 10*3/uL — ABNORMAL LOW (ref 0.7–4.0)
MCH: 30.2 pg (ref 26.0–34.0)
MCHC: 33.7 g/dL (ref 30.0–36.0)
MCV: 89.5 fL (ref 80.0–100.0)
Monocytes Absolute: 0.4 10*3/uL (ref 0.1–1.0)
Monocytes Relative: 7 %
Neutro Abs: 4.3 10*3/uL (ref 1.7–7.7)
Neutrophils Relative %: 84 %
Platelets: 166 10*3/uL (ref 150–400)
RBC: 4.21 MIL/uL — ABNORMAL LOW (ref 4.22–5.81)
RDW: 13.8 % (ref 11.5–15.5)
WBC: 5.1 10*3/uL (ref 4.0–10.5)
nRBC: 0 % (ref 0.0–0.2)

## 2021-05-25 LAB — D-DIMER, QUANTITATIVE: D-Dimer, Quant: 0.41 ug/mL-FEU (ref 0.00–0.50)

## 2021-05-25 LAB — BASIC METABOLIC PANEL
Anion gap: 12 (ref 5–15)
BUN: 10 mg/dL (ref 8–23)
CO2: 26 mmol/L (ref 22–32)
Calcium: 8.7 mg/dL — ABNORMAL LOW (ref 8.9–10.3)
Chloride: 101 mmol/L (ref 98–111)
Creatinine, Ser: 0.97 mg/dL (ref 0.61–1.24)
GFR, Estimated: >60 mL/min (ref >60)
Glucose, Bld: 126 mg/dL — ABNORMAL HIGH (ref 70–99)
Potassium: 3.8 mmol/L (ref 3.5–5.1)
Sodium: 139 mmol/L (ref 135–145)

## 2021-05-25 LAB — RESP PANEL BY RT-PCR (FLU A&B, COVID) ARPGX2
Influenza A by PCR: NEGATIVE
Influenza B by PCR: NEGATIVE
SARS Coronavirus 2 by RT PCR: NEGATIVE

## 2021-05-25 LAB — TROPONIN I (HIGH SENSITIVITY): Troponin I (High Sensitivity): 12 ng/L (ref <18)

## 2021-05-25 LAB — BRAIN NATRIURETIC PEPTIDE: B Natriuretic Peptide: 161.2 pg/mL — ABNORMAL HIGH (ref 0.0–100.0)

## 2021-05-25 MED ORDER — DOXYCYCLINE HYCLATE 100 MG PO TABS
100.0000 mg | ORAL_TABLET | Freq: Two times a day (BID) | ORAL | Status: DC
  Administered 2021-05-25 – 2021-05-28 (×6): 100 mg via ORAL
  Filled 2021-05-25 (×6): qty 1

## 2021-05-25 MED ORDER — AMLODIPINE BESYLATE 5 MG PO TABS
5.0000 mg | ORAL_TABLET | Freq: Every day | ORAL | Status: DC
  Administered 2021-05-25 – 2021-05-28 (×4): 5 mg via ORAL
  Filled 2021-05-25 (×4): qty 1

## 2021-05-25 MED ORDER — IPRATROPIUM BROMIDE 0.02 % IN SOLN
0.5000 mg | Freq: Once | RESPIRATORY_TRACT | Status: AC
  Administered 2021-05-25: 0.5 mg via RESPIRATORY_TRACT
  Filled 2021-05-25: qty 2.5

## 2021-05-25 MED ORDER — DOXYCYCLINE HYCLATE 100 MG PO TABS
100.0000 mg | ORAL_TABLET | Freq: Once | ORAL | Status: AC
  Administered 2021-05-25: 100 mg via ORAL
  Filled 2021-05-25: qty 1

## 2021-05-25 MED ORDER — SENNOSIDES-DOCUSATE SODIUM 8.6-50 MG PO TABS: 1.0000 | ORAL_TABLET | Freq: Every evening | ORAL | Status: DC | PRN

## 2021-05-25 MED ORDER — GUAIFENESIN ER 600 MG PO TB12
600.0000 mg | ORAL_TABLET | Freq: Two times a day (BID) | ORAL | Status: DC
  Administered 2021-05-25 – 2021-05-28 (×6): 600 mg via ORAL
  Filled 2021-05-25 (×6): qty 1

## 2021-05-25 MED ORDER — SODIUM CHLORIDE 0.9 % IV SOLN
1.0000 g | INTRAVENOUS | Status: DC
  Administered 2021-05-26 – 2021-05-27 (×2): 1 g via INTRAVENOUS
  Filled 2021-05-25 (×2): qty 10

## 2021-05-25 MED ORDER — ACETAMINOPHEN 325 MG PO TABS: 650.0000 mg | ORAL_TABLET | Freq: Four times a day (QID) | ORAL | Status: DC | PRN

## 2021-05-25 MED ORDER — METHYLPREDNISOLONE SODIUM SUCC 40 MG IJ SOLR
40.0000 mg | Freq: Two times a day (BID) | INTRAMUSCULAR | Status: DC
  Administered 2021-05-25 – 2021-05-28 (×6): 40 mg via INTRAVENOUS
  Filled 2021-05-25 (×6): qty 1

## 2021-05-25 MED ORDER — IPRATROPIUM-ALBUTEROL 0.5-2.5 (3) MG/3ML IN SOLN: 3.0000 mL | Freq: Four times a day (QID) | RESPIRATORY_TRACT | Status: DC | PRN

## 2021-05-25 MED ORDER — SODIUM CHLORIDE 0.9 % IV SOLN: 1.0000 g | INTRAVENOUS | Status: DC

## 2021-05-25 MED ORDER — IOHEXOL 350 MG/ML SOLN
50.0000 mL | Freq: Once | INTRAVENOUS | Status: AC | PRN
  Administered 2021-05-25: 50 mL via INTRAVENOUS

## 2021-05-25 MED ORDER — ALBUTEROL SULFATE (2.5 MG/3ML) 0.083% IN NEBU
2.5000 mg | INHALATION_SOLUTION | Freq: Once | RESPIRATORY_TRACT | Status: AC
  Administered 2021-05-25: 2.5 mg via RESPIRATORY_TRACT
  Filled 2021-05-25: qty 3

## 2021-05-25 MED ORDER — PRAVASTATIN SODIUM 40 MG PO TABS
40.0000 mg | ORAL_TABLET | Freq: Every day | ORAL | Status: DC
  Administered 2021-05-25 – 2021-05-27 (×3): 40 mg via ORAL
  Filled 2021-05-25 (×3): qty 1

## 2021-05-25 MED ORDER — LACTATED RINGERS IV SOLN: INTRAVENOUS | Status: DC

## 2021-05-25 MED ORDER — SODIUM CHLORIDE 0.9 % IV SOLN
1.0000 g | Freq: Once | INTRAVENOUS | Status: AC
  Administered 2021-05-25: 1 g via INTRAVENOUS
  Filled 2021-05-25: qty 10

## 2021-05-25 MED ORDER — IPRATROPIUM-ALBUTEROL 0.5-2.5 (3) MG/3ML IN SOLN
3.0000 mL | Freq: Four times a day (QID) | RESPIRATORY_TRACT | Status: DC
  Administered 2021-05-25 – 2021-05-27 (×8): 3 mL via RESPIRATORY_TRACT
  Filled 2021-05-25 (×8): qty 3

## 2021-05-25 MED ORDER — IRBESARTAN 300 MG PO TABS
150.0000 mg | ORAL_TABLET | Freq: Every day | ORAL | Status: DC
  Administered 2021-05-25 – 2021-05-28 (×4): 150 mg via ORAL
  Filled 2021-05-25 (×4): qty 1

## 2021-05-25 MED ORDER — ALBUTEROL SULFATE HFA 108 (90 BASE) MCG/ACT IN AERS
2.0000 | INHALATION_SPRAY | RESPIRATORY_TRACT | Status: DC | PRN
  Filled 2021-05-25: qty 6.7

## 2021-05-25 MED ORDER — ACETAMINOPHEN 650 MG RE SUPP: 650.0000 mg | Freq: Four times a day (QID) | RECTAL | Status: DC | PRN

## 2021-05-25 MED ORDER — FLUTICASONE PROPIONATE (INHAL) 50 MCG/BLIST IN AEPB: 1.0000 | INHALATION_SPRAY | Freq: Two times a day (BID) | RESPIRATORY_TRACT | Status: DC

## 2021-05-25 MED ORDER — FLUTICASONE PROPIONATE HFA 110 MCG/ACT IN AERO
1.0000 | INHALATION_SPRAY | Freq: Two times a day (BID) | RESPIRATORY_TRACT | Status: DC
  Administered 2021-05-26 – 2021-05-28 (×5): 1 via RESPIRATORY_TRACT
  Filled 2021-05-25 (×2): qty 12

## 2021-05-25 NOTE — ED Triage Notes (Signed)
Pt arrives via [facility] from home. Pt was d/c from the ED yesterday after draining of a perineal abscess. While pt was using the bathroom he noticed some of the packing had come out. PT and wife noticed blood on the bed and sheets so she got worried and had him transported

## 2021-05-25 NOTE — ED Notes (Signed)
Pt resting in stretcher, no distress noted, denies needs at this time. Call light in reach, wife at bedside.

## 2021-05-25 NOTE — ED Notes (Signed)
Lunch tray ordered 

## 2021-05-25 NOTE — Consult Note (Signed)
Reason for Consult:perineal abscess Referring Physician: [name] is an 87 y.o. male.  HPI:87 yom with mmp including afib on xarelto presented yesterday with perineal pain, fever.  He was found to have an abscess and this was drained and packed in the er. He continued with xarelto and had bleeding noted from site leading him to return to er He also has pulmonary complaints that he is getting admitted for.   Past Medical History:  Diagnosis Date   Allergic rhinitis    Arthritis    Asthma    Atrial fibrillation [facility])    sees Guy Moore at [facility]    Atrial flutter ([place])    BCC (basal cell carcinoma of skin)    Nose   COPD with emphysema ([place])    sees Guy. Chesley Moore    Depression    Diabetes mellitus ([place])    Diet control    Diverticulosis    Erosive esophagitis    Esophageal stricture    Gastric ulcer    Hearing loss    uses amplification   Hemorrhoids    Hiatal hernia    HTN (hypertension)    Hyperlipidemia    Knee problem    2% permanent partial impairment Right   Macular degeneration    Sleep apnea     Past Surgical History:  Procedure Laterality Date   CARDIAC ELECTROPHYSIOLOGY STUDY AND ABLATION     x 4 (sees Guy Moore at [facility])   [place]  1980's   VASECTOMY  [id number]   w/complications    Family History  Problem Relation Age of Onset   Stroke Mother    Breast cancer Mother    Heart disease Father    Hypertension Father    Cancer Father        Bladder   Breast cancer Brother    Atrial fibrillation Brother    Tremor Brother    Stroke Brother    Colon cancer Neg Hx     Social History:  reports that he quit smoking about 43 years ago. His smoking use included cigarettes. He has a 28.00 pack-year smoking history. He has never used smokeless tobacco. He reports that he does not drink alcohol and does not use drugs.  Allergies: No Known  Allergies  Medications: I have reviewed the patient's current medications.  Results for orders placed or performed during the hospital encounter of 05/24/21 (from the past 48 hour(s))  CBC with Differential/Platelet     Status: Abnormal   Collection Time: 05/25/21 12:25 AM  Result Value Ref Range   WBC 5.1 4.0 - 10.5 K/uL   RBC 4.21 (L) 4.22 - 5.81 MIL/uL   Hemoglobin 12.7 (L) 13.0 - 17.0 g/dL   HCT 37.7 (L) 39.0 - 52.0 %   MCV 89.5 80.0 - 100.0 fL   MCH 30.2 26.0 - 34.0 pg   MCHC 33.7 30.0 - 36.0 g/dL   RDW 13.8 11.5 - 15.5 %   Platelets 166 150 - 400 K/uL   nRBC 0.0 0.0 - 0.2 %   Neutrophils Relative % 84 %   Neutro Abs 4.3 1.7 - 7.7 K/uL   Lymphocytes Relative 7 %   Lymphs Abs 0.3 (L) 0.7 - 4.0 K/uL   Monocytes Relative 7 %   Monocytes Absolute 0.4 0.1 - 1.0 K/uL   Eosinophils Relative 1 %   Eosinophils Absolute 0.0 0.0 - 0.5 K/uL  Basophils Relative 0 %   Basophils Absolute 0.0 0.0 - 0.1 K/uL   Immature Granulocytes 1 %   Abs Immature Granulocytes 0.03 0.00 - 0.07 K/uL    Comment: Performed at [facility], [place] [address] [place], [place] [id number]  Basic metabolic panel     Status: Abnormal   Collection Time: 05/25/21 12:25 AM  Result Value Ref Range   Sodium 139 135 - 145 mmol/L   Potassium 3.8 3.5 - 5.1 mmol/L   Chloride 101 98 - 111 mmol/L   CO2 26 22 - 32 mmol/L   Glucose, Bld 126 (H) 70 - 99 mg/dL    Comment: Glucose reference range applies only to samples taken after fasting for at least 8 hours.   BUN 10 8 - 23 mg/dL   Creatinine, Ser 0.97 0.61 - 1.24 mg/dL   Calcium 8.7 (L) 8.9 - 10.3 mg/dL   GFR, Estimated >60 >60 mL/min    Comment: (NOTE) Calculated using the CKD-EPI Creatinine Equation (2021)    Anion gap 12 5 - 15    Comment: Performed at [facility] [address], [place], [place] [address]  Resp Panel by RT-PCR (Flu A&B, Covid) Nasopharyngeal Swab     Status: None   Collection Time: 05/25/21  2:12 AM   Specimen: Nasopharyngeal  Swab; Nasopharyngeal(NP) swabs in vial transport medium  Result Value Ref Range   SARS Coronavirus 2 by RT PCR NEGATIVE NEGATIVE    Comment: (NOTE) SARS-CoV-2 target nucleic acids are NOT DETECTED.  The SARS-CoV-2 RNA is generally detectable in upper respiratory specimens during the acute phase of infection. The lowest concentration of SARS-CoV-2 viral copies this assay can detect is 138 copies/mL. A negative result does not preclude SARS-Cov-2 infection and should not be used as the sole basis for treatment or other patient management decisions. A negative result may occur with  improper specimen collection/handling, submission of specimen other than nasopharyngeal swab, presence of viral mutation(s) within the areas targeted by this assay, and inadequate number of viral copies(<138 copies/mL). A negative result must be combined with clinical observations, patient history, and epidemiological information. The expected result is Negative.  Fact Sheet for Patients:  EntrepreneurPulse.com.au  Fact Sheet for Healthcare Providers:  IncredibleEmployment.be  This test is no t yet approved or cleared by the [place] FDA and  has been authorized for detection and/or diagnosis of SARS-CoV-2 by FDA under an Emergency Use Authorization (EUA). This EUA will remain  in effect (meaning this test can be used) for the duration of the COVID-19 declaration under Section 564(b)(1) of the Act, 21 U.S.C.section 360bbb-3(b)(1), unless the authorization is terminated  or revoked sooner.       Influenza A by PCR NEGATIVE NEGATIVE   Influenza B by PCR NEGATIVE NEGATIVE    Comment: (NOTE) The Xpert Xpress SARS-CoV-2/FLU/RSV plus assay is intended as an aid in the diagnosis of influenza from Nasopharyngeal swab specimens and should not be used as a sole basis for treatment. Nasal washings and aspirates are unacceptable for Xpert Xpress  SARS-CoV-2/FLU/RSV testing.  Fact Sheet for Patients: EntrepreneurPulse.com.au  Fact Sheet for Healthcare Providers: IncredibleEmployment.be  This test is not yet approved or cleared by the [place] FDA and has been authorized for detection and/or diagnosis of SARS-CoV-2 by FDA under an Emergency Use Authorization (EUA). This EUA will remain in effect (meaning this test can be used) for the duration of the COVID-19 declaration under Section 564(b)(1) of the Act, 21 U.S.C. section  360bbb-3(b)(1), unless the authorization is terminated or revoked.  Performed at [facility], [place] [address] [place], [place] [address]   Brain natriuretic peptide     Status: Abnormal   Collection Time: 05/25/21  2:58 AM  Result Value Ref Range   B Natriuretic Peptide 161.2 (H) 0.0 - 100.0 pg/mL    Comment: Performed at [facility] [address] [place], [place] [address]  Troponin I (High Sensitivity)     Status: None   Collection Time: 05/25/21  2:58 AM  Result Value Ref Range   Troponin I (High Sensitivity) 12 <18 ng/L    Comment: (NOTE) Elevated high sensitivity troponin I (hsTnI) values and significant  changes across serial measurements may suggest ACS but many other  chronic and acute conditions are known to elevate hsTnI results.  Refer to the "Links" section for chest pain algorithms and additional  guidance. Performed at [facility], [place] [address] [place], [place] [address]   D-dimer, quantitative     Status: None   Collection Time: 05/25/21  2:58 AM  Result Value Ref Range   D-Dimer, Quant 0.41 0.00 - 0.50 ug/mL-FEU    Comment: (NOTE) At the manufacturer cut-off value of 0.5 g/mL FEU, this assay has a negative predictive value of 95-100%.This assay is intended for use in conjunction with a clinical pretest probability (PTP) assessment model to exclude pulmonary embolism (PE) and deep venous thrombosis (DVT) in  outpatients suspected of PE or DVT. Results should be correlated with clinical presentation. Performed at [facility], [place] [address] [place], [place] [address]   Procalcitonin     Status: None   Collection Time: 05/25/21  5:34 AM  Result Value Ref Range   Procalcitonin <0.10 ng/mL    Comment:        Interpretation: PCT (Procalcitonin) <= 0.5 ng/mL: Systemic infection (sepsis) is not likely. Local bacterial infection is possible. (NOTE)       Sepsis PCT Algorithm           Lower Respiratory Tract                                      Infection PCT Algorithm    ----------------------------     ----------------------------         PCT < 0.25 ng/mL                PCT < 0.10 ng/mL          Strongly encourage             Strongly discourage   discontinuation of antibiotics    initiation of antibiotics    ----------------------------     -----------------------------       PCT 0.25 - 0.50 ng/mL            PCT 0.10 - 0.25 ng/mL               OR       >80% decrease in PCT            Discourage initiation of                                            antibiotics      Encourage discontinuation  of antibiotics    ----------------------------     -----------------------------         PCT >= 0.50 ng/mL              PCT 0.26 - 0.50 ng/mL               AND        <80% decrease in PCT             Encourage initiation of                                             antibiotics       Encourage continuation           of antibiotics    ----------------------------     -----------------------------        PCT >= 0.50 ng/mL                  PCT > 0.50 ng/mL               AND         increase in PCT                  Strongly encourage                                      initiation of antibiotics    Strongly encourage escalation           of antibiotics                                     -----------------------------                                           PCT <= 0.25 ng/mL                                                  OR                                        > 80% decrease in PCT                                      Discontinue / Do not initiate                                             antibiotics  Performed at [facility], [address] [place], [place] [address]     CT CHEST W CONTRAST  Result Date: 05/25/2021 CLINICAL DATA:  Recent perianal abscess drainage.  Asthma. EXAM: CT CHEST WITH CONTRAST TECHNIQUE: Multidetector CT imaging of  the chest was performed during intravenous contrast administration. CONTRAST:  33m OMNIPAQUE IOHEXOL 350 MG/ML SOLN COMPARISON:  None. FINDINGS: Cardiovascular: Coronary artery calcification and aortic atherosclerotic calcification. Pacer wires in RIGHT heart. Mediastinum/Nodes: No axillary or supraclavicular adenopathy. No mediastinal or hilar adenopathy. No pericardial fluid. Esophagus normal. Lungs/Pleura: There is mild bibasilar ground-glass opacities consistent with atelectasis. Band of atelectasis in the RIGHT middle lobe. No airspace disease. No pneumothorax. Mild centrilobular emphysema the upper lobes. Upper Abdomen: Limited view of the liver, kidneys, pancreas are unremarkable. Normal adrenal glands. Musculoskeletal: Degenerative osteophytosis of the spine. IMPRESSION: 1. No acute cardiopulmonary findings. 2. Mild basilar atelectasis. 3. Coronary artery calcification and Aortic Atherosclerosis (ICD10-I70.0). Electronically Signed   By: SSuzy BouchardM.D.   On: 05/25/2021 07:30   DG Chest Port 1 View  Result Date: 05/25/2021 CLINICAL DATA:  Weakness. EXAM: PORTABLE CHEST 1 VIEW COMPARISON:  November 21, 2018 FINDINGS: There is a dual lead AICD. Mild areas of atelectasis and/or early infiltrate are seen in the bilateral lung bases. The heart size and mediastinal contours are within normal limits. The visualized skeletal structures are unremarkable. IMPRESSION: Mild bibasilar atelectasis and/or early infiltrate.  Electronically Signed   By: TVirgina NorfolkM.D.   On: 05/25/2021 00:38    Review of Systems  Constitutional:  Negative for fever.  Respiratory:  Positive for shortness of breath.   Gastrointestinal:  Positive for anal bleeding. Negative for abdominal pain and blood in stool.  All other systems reviewed and are negative. Blood pressure 119/72, pulse 71, temperature 98 F (36.7 C), temperature source Oral, resp. rate 20, height '5\' 10"'$  (1.778 m), weight 115.8 kg, SpO2 91 %. Physical Exam Constitutional:      Appearance: Normal appearance.  Eyes:     General: No scleral icterus. Cardiovascular:     Rate and Rhythm: Normal rate.  Pulmonary:     Effort: Pulmonary effort is normal.  Abdominal:     Palpations: Abdomen is soft.     Tenderness: There is no abdominal tenderness.  Genitourinary:    Comments: Perineal incision with some oozing, I probed this and dont really get more purulence, it is packed, I placed surgicel also and dressing Neurological:     Mental Status: He is alert.    Assessment/Plan: Perineal abscess s/p I/D -dont think needs any more drainage, bid dressing changes but would not start until tomorrow and leave packed for hemostasis -hold xarelto  MRolm Bookbinder8/14/2022, 2:08 PM

## 2021-05-25 NOTE — ED Provider Notes (Signed)
[facility] EMERGENCY DEPARTMENT Provider Note   CSN: [id number] Arrival date & time: 05/24/21  2358     History Chief Complaint - post operative bleeding  Guy Moore is a 87 y.o. male.  The history is provided by the patient.  Patient presents with postoperative bleeding.  Patient with history of atrial fibrillation on anticoagulation, COPD presents with postoperative bleeding.  Patient was just in the emergency department and had a perineal abscess that was drained in the ED.  Patient tolerated this very well and was discharged.  He reports after he went home he started noticing bleeding from the wound.  He also reports he had a bowel movement and was concerned that he soiled the wound.  He reports he started feeling lightheaded and short of breath. No chest or abdominal pain.  He reports the bleeding is improved. Nothing worsens his symptoms. His course is improving    Past Medical History:  Diagnosis Date   Allergic rhinitis    Arthritis    Asthma    Atrial fibrillation ([place])    sees Dr. [name] at [facility]    Atrial flutter ([place])    BCC (basal cell carcinoma of skin)    Nose   COPD with emphysema ([place])    sees Dr. [name]    Depression    Diabetes mellitus ([place])    Diet control    Diverticulosis    Erosive esophagitis    Esophageal stricture    Gastric ulcer    Hearing loss    uses amplification   Hemorrhoids    Hiatal hernia    HTN (hypertension)    Hyperlipidemia    Knee problem    2% permanent partial impairment Right   Macular degeneration    Sleep apnea     Patient Active Problem List   Diagnosis Date Noted   Bicytopenia 08/06/2020   B12 deficiency 05/06/2020   COPD with asthma ([place]) 03/08/2018   Asthma 08/26/2016   Hyperglycemia 05/29/2016   Thoracic scoliosis 12/16/2015   Thrombocytopenia ([place]) 04/12/2015   History of basal cell cancer 06/27/2014   Bleeding gums 04/15/2013   GERD  (gastroesophageal reflux disease) 08/31/2011   Anticoagulant long-term use 08/31/2011   OBSTRUCTIVE SLEEP APNEA 07/04/2009   Atrial fibrillation ([place]) 04/19/2009   ALLERGIC RHINITIS 04/14/2007   Hyperlipidemia 04/13/2007   Essential hypertension 04/13/2007    Past Surgical History:  Procedure Laterality Date   CARDIAC ELECTROPHYSIOLOGY STUDY AND ABLATION     x 4 (sees Dr. [name] at [facility])   [place]  1980's   VASECTOMY  [id number]   w/complications       Family History  Problem Relation Age of Onset   Stroke Mother    Breast cancer Mother    Heart disease Father    Hypertension Father    Cancer Father        Bladder   Breast cancer Brother    Atrial fibrillation Brother    Tremor Brother    Stroke Brother    Colon cancer Neg Hx     Social History   Tobacco Use   Smoking status: Former    Packs/day: 1.00    Years: 28.00    Pack years: 28.00    Types: Cigarettes    Quit date: 10/12/1977    Years since quitting: 43.6   Smokeless tobacco: Never  Substance Use Topics   Alcohol use: No  Alcohol/week: 0.0 standard drinks   Drug use: No    Home Medications Prior to Admission medications   Medication Sig Start Date End Date Taking? Authorizing Provider  acetaminophen (TYLENOL) 500 MG tablet Take 500-1,000 mg by mouth every 6 (six) hours as needed (for pain).    [provider]  albuterol (VENTOLIN HFA) 108 (90 Base) MCG/ACT inhaler USE 2 INHALATIONS EVERY 6 HOURS AS NEEDED FOR WHEEZING OR SHORTNESS OF BREATH 02/03/21   [name], MD  amLODipine (NORVASC) 5 MG tablet TAKE 1 TABLET DAILY 07/08/20   Laurey Morale, MD  BESIVANCE 0.6 % SUSP SMARTSIG:1 In Eye(s) 07/24/20   [provider]  doxycycline (VIBRAMYCIN) 100 MG capsule Take 1 capsule (100 mg total) by mouth 2 (two) times daily. 05/24/21   Claud Kelp, MD  fluticasone (FLOVENT DISKUS) 50 MCG/BLIST diskus inhaler Inhale 1 puff into the lungs 2  (two) times daily. 02/28/21   [name], MD  MAGNESIUM GLUCONATE PO Take 1 tablet by mouth daily.    [provider]  Methylcellulose, Laxative, (CITRUCEL PO) Take 1 tablet by mouth daily after breakfast.    [provider]  Multiple Vitamins-Minerals (PRESERVISION AREDS 2) CAPS Take 1 capsule by mouth 2 (two) times daily.    [provider]  omeprazole (PRILOSEC) 40 MG capsule TAKE 1 CAPSULE DAILY 09/18/20   Laurey Morale, MD  potassium chloride SA (KLOR-CON) 20 MEQ tablet TAKE 1 TABLET DAILY 07/08/20   Laurey Morale, MD  pravastatin (PRAVACHOL) 40 MG tablet TAKE 1 TABLET AT BEDTIME 09/10/20   Laurey Morale, MD  valsartan (DIOVAN) 160 MG tablet TAKE 1 TABLET DAILY 09/18/20   Laurey Morale, MD  XARELTO 20 MG TABS tablet TAKE 1 TABLET DAILY 04/15/21   Laurey Morale, MD    Allergies    Patient has no known allergies.  Review of Systems   Review of Systems  Constitutional:  Negative for fever.  Respiratory:  Positive for shortness of breath.   Cardiovascular:  Negative for chest pain.  Gastrointestinal:  Positive for anal bleeding. Negative for abdominal pain.  Neurological:  Positive for light-headedness.  All other systems reviewed and are negative.  Physical Exam Updated Vital Signs BP 132/76 (BP Location: Right Arm)   Pulse 76   Temp 98.1 F (36.7 C) (Oral)   Resp 16   SpO2 100%   Physical Exam CONSTITUTIONAL: Elderly, mildly anxious HEAD: Normocephalic/atraumatic EYES: EOMI/PERRL, conjunctiva pink ENMT: Mucous membranes moist NECK: supple no meningeal signs CV: S1/S2 noted, irregular LUNGS: Lungs are clear to auscultation bilaterally, no apparent distress ABDOMEN: soft, nontender, no rebound or guarding, bowel sounds noted throughout abdomen Rectal-perineal abscesses noted with packing in place.  There is no active bleeding.  There is no stool noted around the wound.  It appears very clean NEURO: Pt is awake/alert/appropriate, moves all  extremitiesx4.  No facial droop.   EXTREMITIES: pulses normal/equal, full ROM SKIN: warm, color normal PSYCH: no abnormalities of mood noted, alert and oriented to situation  ED Results / Procedures / Treatments   Labs (all labs ordered are listed, but only abnormal results are displayed) Labs Reviewed  CBC WITH DIFFERENTIAL/PLATELET - Abnormal; Notable for the following components:      Result Value   RBC 4.21 (*)    Hemoglobin 12.7 (*)    HCT 37.7 (*)    Lymphs Abs 0.3 (*)    All other components within normal limits  BASIC METABOLIC PANEL - Abnormal; Notable for  the following components:   Glucose, Bld 126 (*)    Calcium 8.7 (*)    All other components within normal limits  RESP PANEL BY RT-PCR (FLU A&B, COVID) [id number]    EKG EKG Interpretation  Date/Time:  Sunday May 25 2021 02:49:07 EDT Ventricular Rate:  82 PR Interval:    QRS Duration: 105 QT Interval:  417 QTC Calculation: 487 R Axis:   14 Text Interpretation: Atrial fibrillation Abnormal R-wave progression, early transition Abnrm T, consider ischemia, anterolateral lds Confirmed by Ripley Fraise [phone]) on 05/25/2021 2:56:21 AM  Radiology DG Chest Port 1 View  Result Date: 05/25/2021 CLINICAL DATA:  Weakness. EXAM: PORTABLE CHEST 1 VIEW COMPARISON:  November 21, 2018 FINDINGS: There is a dual lead AICD. Mild areas of atelectasis and/or early infiltrate are seen in the bilateral lung bases. The heart size and mediastinal contours are within normal limits. The visualized skeletal structures are unremarkable. IMPRESSION: Mild bibasilar atelectasis and/or early infiltrate. Electronically Signed   By: Virgina Norfolk M.D.   On: 05/25/2021 00:38    Procedures Procedures   Medications Ordered in ED Medications  albuterol (PROVENTIL) (2.5 MG/3ML) 0.083% nebulizer solution 2.5 mg (has no administration in time range)  ipratropium (ATROVENT) nebulizer solution 0.5 mg (has no administration in time range)   cefTRIAXone (ROCEPHIN) 1 g in sodium chloride 0.9 % 100 mL IVPB (has no administration in time range)  doxycycline (VIBRA-TABS) tablet 100 mg (has no administration in time range)    ED Course  I have reviewed the triage vital signs and the nursing notes.  Pertinent labs & imaging results that were available during my care of the patient were reviewed by me and considered in my medical decision making (see chart for details).    MDM Rules/Calculators/A&P                           Patient presents with bleeding after abscess drainage yesterday.  He reports his bleeding does appear to be improved.  He had a very minimal drop in his hemoglobin.  He also reports recent cough and shortness of breath but has had negative COVID testing. Will ambulate patient and reassess 2:47 AM Patient with significant dyspnea and weakness upon walking.  No chest pain has been reported. Patient reports this is a significant worsening in his baseline. Will expand work-up to include cardiac and PE evaluation 5:13 AM Labs overall reassuring.  Patient still reports feeling short of breath.  He did have 1 episode of hypoxia in the emergency department. With any movement in the bed he appears to be breathless.  On pulmonary exam, there is no wheezing but decreased breath sounds in the bases and has  cough throughout exam. Given x-ray findings, his worsening shortness of breath and generalized weakness, I feel pneumonia could be the cause of his symptoms. Discussed with Dr. Tonie Griffith  for admission He recommends antibiotics, will send procalcitonin and he may need CT chest Final Clinical Impression(s) / ED Diagnoses Final diagnoses:  Community acquired pneumonia, unspecified laterality    Rx / DC Orders ED Discharge Orders     None        Ripley Fraise, MD 05/25/21 [phone]

## 2021-05-25 NOTE — H&P (Signed)
History and Physical    Guy Moore [id number] DOB: 01/22/1934 DOA: 05/24/2021  PCP: Laurey Morale, MD   Patient coming from: Home  Chief Complaint: perianal bleeding after abscess I&D. SOB.  HPI: Guy Moore is a 87 y.o. male with medical history significant for A-fib on xaraleto, COPD/Asthma, HTN who presents by EMS with bleeding from perianal abscess that was I&D'd yesterday in ER.  He reports after he went home he was doing well but then he noticed bleeding from the I&D the area when he went to have a bowel movement.  He states it was a copious mount of bleeding and was unable to initially stop it so EMS was called.  He was brought to the hospital and by that time bleeding had stopped.  Also reported being very short of breath and he became short of breath with speaking in normal sentences.  He reports he has been having fatigue and feels worn out when he tries to even walk across the room.  He was ambulated in the emergency room but was unable to go very far secondary to being short of breath and having to stop and rest.  He did have a oxygen saturation of 87% per report from EMS when they first arrived at his house.  He has been on oxygen by nasal cannula in the emergency room.  He reports has been having intermittent fevers for the last 3 to 4 days with chills and decreased p.o. intake due to decreased appetite.  He denies any chest pain or pressure.  He did not have any syncopal episodes.  Reports he would feel lightheaded with shortness of breath but this would stop when he would sit down to rest.  Lives with his wife.  Denies tobacco alcohol or illicit drug use  ED Course: In the emergency room he is remained hemodynamically stable.  He has had shortness of breath even with speaking in normal sentences.  Chest x-ray showed mild diffuse early infiltrates versus atelectasis.  BMP and CBC are unremarkable.  COVID-19 swab was negative.  Influenza A and B swab are negative.  D-dimer  has been ordered in the emergency room and is 0.41.  Troponin is 12.  BNP 161.2.  Patient was placed on empiric antibiotics in the emergency room.  Hospitalist service was asked to admit for further management as patient is not able to ambulate due to dyspnea.  Review of Systems:  General: Reports intermittent fever, chills. Reports feeling light headed intermittently. Denies weight loss, night sweats. Denies change in appetite HENT: Denies head trauma, headache, denies change in hearing, tinnitus. Denies nasal congestion. Denies sore throat.  Denies difficulty swallowing Eyes: Denies blurry vision, pain in eye, drainage.  Denies discoloration of eyes. Neck: Denies pain.  Denies swelling.  Denies pain with movement. Cardiovascular: Denies chest pain, palpitations.  Denies edema.  Denies orthopnea Respiratory: Reports shortness of breath, cough.  Denies wheezing.  Denies sputum production Gastrointestinal: Denies abdominal pain, swelling.  Denies nausea, vomiting, diarrhea.  Denies melena.  Denies hematemesis. Musculoskeletal: Denies limitation of movement.  Denies deformity or swelling.  Denies pain.  Denies arthralgias or myalgias. Genitourinary: Denies pelvic pain.  Denies urinary frequency or hesitancy.  Denies dysuria. Perianal bleeding.  Skin: Denies rash.  Denies petechiae, purpura, ecchymosis. Neurological: Denies syncope. Denies seizure activity. Denies paresthesia. Denies slurred speech, drooping face. Denies visual change. Psychiatric: Denies depression, anxiety. Denies hallucinations.  Past Medical History:  Diagnosis Date   Allergic rhinitis  Arthritis    Asthma    Atrial fibrillation [facility])    sees Dr. [name] at [facility]    Atrial flutter ([place])    [place] (basal cell carcinoma of skin)    Nose   COPD with emphysema ([place])    sees Dr. [name]    Depression    Diabetes mellitus ([place])    Diet control    Diverticulosis    Erosive esophagitis     Esophageal stricture    Gastric ulcer    Hearing loss    uses amplification   Hemorrhoids    Hiatal hernia    HTN (hypertension)    Hyperlipidemia    Knee problem    2% permanent partial impairment Right   Macular degeneration    Sleep apnea     Past Surgical History:  Procedure Laterality Date   CARDIAC ELECTROPHYSIOLOGY STUDY AND ABLATION     x 4 (sees Dr. [name] at [facility])   [place]  1980's   VASECTOMY  [id number]   w/complications    Social History  reports that he quit smoking about 43 years ago. His smoking use included cigarettes. He has a 28.00 pack-year smoking history. He has never used smokeless tobacco. He reports that he does not drink alcohol and does not use drugs.  No Known Allergies  Family History  Problem Relation Age of Onset   Stroke Mother    Breast cancer Mother    Heart disease Father    Hypertension Father    Cancer Father        Bladder   Breast cancer Brother    Atrial fibrillation Brother    Tremor Brother    Stroke Brother    Colon cancer Neg Hx      Prior to Admission medications   Medication Sig Start Date End Date Taking? Authorizing Provider  acetaminophen (TYLENOL) 500 MG tablet Take 500-1,000 mg by mouth every 6 (six) hours as needed (for pain).    [provider]  albuterol (VENTOLIN HFA) 108 (90 Base) MCG/ACT inhaler USE 2 INHALATIONS EVERY 6 HOURS AS NEEDED FOR WHEEZING OR SHORTNESS OF BREATH 02/03/21   [name], MD  amLODipine (NORVASC) 5 MG tablet TAKE 1 TABLET DAILY 07/08/20   Laurey Morale, MD  BESIVANCE 0.6 % SUSP SMARTSIG:1 In Eye(s) 07/24/20   [provider]  doxycycline (VIBRAMYCIN) 100 MG capsule Take 1 capsule (100 mg total) by mouth 2 (two) times daily. 05/24/21   Claud Kelp, MD  fluticasone (FLOVENT DISKUS) 50 MCG/BLIST diskus inhaler Inhale 1 puff into the lungs 2 (two) times daily. 02/28/21   [name], MD  MAGNESIUM GLUCONATE PO Take 1 tablet  by mouth daily.    [provider]  Methylcellulose, Laxative, (CITRUCEL PO) Take 1 tablet by mouth daily after breakfast.    [provider]  Multiple Vitamins-Minerals (PRESERVISION AREDS 2) CAPS Take 1 capsule by mouth 2 (two) times daily.    [provider]  omeprazole (PRILOSEC) 40 MG capsule TAKE 1 CAPSULE DAILY 09/18/20   Laurey Morale, MD  potassium chloride SA (KLOR-CON) 20 MEQ tablet TAKE 1 TABLET DAILY 07/08/20   Laurey Morale, MD  pravastatin (PRAVACHOL) 40 MG tablet TAKE 1 TABLET AT BEDTIME 09/10/20   Laurey Morale, MD  valsartan (DIOVAN) 160 MG tablet TAKE 1 TABLET DAILY 09/18/20   Laurey Morale, MD  XARELTO 20 MG TABS tablet TAKE 1 TABLET  DAILY 04/15/21   Laurey Morale, MD    Physical Exam: Vitals:   05/25/21 0315 05/25/21 0330 05/25/21 0345 05/25/21 0358  BP: (!) 142/89 (!) 159/79 (!) 147/90   Pulse: 71 69 69   Resp: (!) 21 (!) 21 20   Temp:      TempSrc:      SpO2: 96% 96% 96%   Weight:    115.8 kg  Height:    '5\' 10"'$  (1.778 m)    Constitutional: Elderly, anxious.  Vitals:   05/25/21 0315 05/25/21 0330 05/25/21 0345 05/25/21 0358  BP: (!) 142/89 (!) 159/79 (!) 147/90   Pulse: 71 69 69   Resp: (!) 21 (!) 21 20   Temp:      TempSrc:      SpO2: 96% 96% 96%   Weight:    115.8 kg  Height:    '5\' 10"'$  (1.778 m)   General: WDWN, Alert and oriented x3.  Eyes: EOMI, PERRL, conjunctivae normal.  Sclera nonicteric HENT:  [place]/AT, external ears normal.  Nares patent without epistasis. Mucous membranes are moist. Posterior pharynx clear  Neck: Soft, normal range of motion, supple, no masses, no thyromegaly. Trachea midline Respiratory: Equal breath sounds that are mildly diminished.  Mild diffuse rales in lower lung fields. no wheezing, no crackles. Normal respiratory effort. No accessory muscle use.  Cardiovascular: Irregularly irregular rhythm with normal rate, no murmurs / rubs / gallops. No extremity edema. 2+ pedal pulses. Abdomen: Soft, no  tenderness, nondistended, no rebound or guarding.  No masses palpated. Bowel sounds normoactive Musculoskeletal: FROM. no cyanosis. No joint deformity upper and lower extremities. Normal muscle tone.  Skin: Warm, dry, intact no rashes, lesions, ulcers.  Rectal perineal abscess with packing without active bleeding or drainage. Neurologic: CN 2-12 grossly intact.  Normal speech.  Sensation intact to touch. Strength 5/5 in all extremities.   Psychiatric: Normal judgment and insight.  Normal mood.    Labs on Admission: I have personally reviewed following labs and imaging studies  CBC: Recent Labs  Lab 05/24/21 1439 05/25/21 0025  WBC 4.7 5.1  NEUTROABS 3.8 4.3  HGB 13.3 12.7*  HCT 41.4 37.7*  MCV 90.2 89.5  PLT 164 [id number]    Basic Metabolic Panel: Recent Labs  Lab 05/24/21 1439 05/25/21 0025  NA 138 139  K 3.6 3.8  CL 101 101  CO2 25 26  GLUCOSE 114* 126*  BUN 11 10  CREATININE 0.95 0.97  CALCIUM 8.9 8.7*    GFR: Estimated Creatinine Clearance: 71 mL/min (by C-G formula based on SCr of 0.97 mg/dL).  Liver Function Tests: Recent Labs  Lab 05/24/21 1439  AST 18  ALT 17  ALKPHOS 51  BILITOT 1.0  PROT 6.6  ALBUMIN 3.6    Urine analysis:    Component Value Date/Time   COLORURINE YELLOW 11/21/2018 1749   APPEARANCEUR CLEAR 11/21/2018 1749   LABSPEC >1.046 (H) 11/21/2018 1749   PHURINE 5.0 11/21/2018 1749   GLUCOSEU NEGATIVE 11/21/2018 1749   HGBUR NEGATIVE 11/21/2018 1749   BILIRUBINUR Negative 07/10/2020 [place] 11/21/2018 1749   PROTEINUR Negative 07/10/2020 1555   PROTEINUR NEGATIVE 11/21/2018 1749   UROBILINOGEN 0.2 07/10/2020 1555   UROBILINOGEN 1.0 12/20/2007 1025   NITRITE Negative 07/10/2020 1555   NITRITE NEGATIVE 11/21/2018 1749   LEUKOCYTESUR Negative 07/10/2020 1555    Radiological Exams on Admission: DG Chest Port 1 View  Result Date: 05/25/2021 CLINICAL DATA:  Weakness. EXAM: PORTABLE CHEST 1 VIEW COMPARISON:  November 21, 2018 FINDINGS: There is a dual lead AICD. Mild areas of atelectasis and/or early infiltrate are seen in the bilateral lung bases. The heart size and mediastinal contours are within normal limits. The visualized skeletal structures are unremarkable. IMPRESSION: Mild bibasilar atelectasis and/or early infiltrate. Electronically Signed   By: Virgina Norfolk M.D.   On: 05/25/2021 00:38    EKG: Independently reviewed.  EKG shows atrial fibrillation with normal rate.  Nonspecific ST changes but no acute ST elevation or depression.  QTc 487  Assessment/Plan Principal Problem:   CAP (community acquired pneumonia) Mr. Irish Elders is admitted to medical telemetry floor.  He is started on Rocephin and Doxycycline for antibiotic coverage.  Procalcitonin is ordered by ER physician and is pending.  Obtain CT chest to better visulaize with CXR not clearly demonstrating an infiltrate with significant dyspnea and some hypoxia. IVF hydration with LR at 75 ml/hr.  Check CBC, electrolytes and renal function in am  Active Problems:   Dyspnea Continue flovent discus, albuterol MDI as needed. Duoneb every 6 hours as needed for SOB Incentive spirometer every two hours while awake.    Essential hypertension Continue with norvasc, ARB therapy. Avapro substituted for diovan per formulary. Monitor BP.    COPD with asthma  Continue Flovent discus, albuterol and DuoNebs as above Supplement oxygen as needed to keep O2 sat between 99 6%    Atrial fibrillation Chronic atrial fibrillation anticoagulated on Xarelto which is continued    Abscess of buttock Patient had I&D yesterday.  Continue with antibiotic as prescribed, was sent home with prescription for doxycycline which she has not picked up yet.  Patient is present doxycycline to cover both abscess and pneumonia    OBSTRUCTIVE SLEEP APNEA CPAP at night    Anticoagulant long-term use Continue Xarelto     Prolonged QT interval Avoid medications with could  further prolong QT interval    DVT prophylaxis: Pt is on Xareloto for chronic anticoagulation which is continued.   Code Status:   Full Code  Family Communication:  Diagnosis and plan discussed with patient and his wife who is at the bedside.  They verbalized understanding and agree with plan.  Further recommendations to follow as clinical indicated Disposition Plan:   Patient is from:  Home  Anticipated DC to:  Home  Anticipated DC date:  Anticipate 2 midnight or more stay in hospital to treat acute condition  Anticipated DC barriers: No barriers to discharge identified at this time.   Admission status:  Inpatient   Yevonne Aline Karma Ansley MD [facility]  How to contact the [facility] Attending or Consulting provider [place] or covering provider during after hours [place], for this patient?   Check the care team in [facility] and look for a) attending/consulting [facility] provider listed and b) the [facility] team listed Log into [email or web address] and use [place]'s universal password to access. If you do not have the password, please contact the hospital operator. Locate the [facility] provider you are looking for under [facility] and page to a number that you can be directly reached. If you still have difficulty reaching the provider, please page the [facility] (Director on Call) for the Hospitalists listed on amion for assistance.  05/25/2021, 5:41 AM

## 2021-05-25 NOTE — Progress Notes (Addendum)
PROGRESS NOTE    Guy Moore  [id number] DOB: January 26, 1934 DOA: 05/24/2021 PCP: Laurey Morale, MD    Chief Complaint  Patient presents with   Bleeding/Bruising    Brief Narrative:   This is a no charge note as patient was seen and admitted earlier today by Dr. Hyman Bible note, chart, imaging and labs were reviewed, patient was seen and examined.     HPI: Guy Moore is a 87 y.o. male with medical history significant for A-fib on xaraleto, COPD/Asthma, HTN who presents by EMS with bleeding from perianal abscess that was I&D'd yesterday in ER.  He reports after he went home he was doing well but then he noticed bleeding from the I&D the area when he went to have a bowel movement.  He states it was a copious mount of bleeding and was unable to initially stop it so EMS was called.  He was brought to the hospital and by that time bleeding had stopped.  Also reported being very short of breath and he became short of breath with speaking in normal sentences.  He reports he has been having fatigue and feels worn out when he tries to even walk across the room.  He was ambulated in the emergency room but was unable to go very far secondary to being short of breath and having to stop and rest.  He did have a oxygen saturation of 87% per report from EMS when they first arrived at his house.  He has been on oxygen by nasal cannula in the emergency room.  He reports has been having intermittent fevers for the last 3 to 4 days with chills and decreased p.o. intake due to decreased appetite.  He denies any chest pain or pressure.  He did not have any syncopal episodes.  Reports he would feel lightheaded with shortness of breath but this would stop when he would sit down to rest.  Lives with his wife.  Denies tobacco alcohol or illicit drug use   ED Course: In the emergency room he is remained hemodynamically stable.  He has had shortness of breath even with speaking in normal sentences.  Chest x-ray  showed mild diffuse early infiltrates versus atelectasis.  BMP and CBC are unremarkable.  COVID-19 swab was negative.  Influenza A and B swab are negative.  D-dimer has been ordered in the emergency room and is 0.41.  Troponin is 12.  BNP 161.2.  Patient was placed on empiric antibiotics in the emergency room.  Hospitalist service was asked to admit for further management as patient is not able to ambulate due to dyspnea.    Assessment & Plan:   Principal Problem:   CAP (community acquired pneumonia) Active Problems:   OBSTRUCTIVE SLEEP APNEA   Essential hypertension   Atrial fibrillation (HCC)   Anticoagulant long-term use   COPD with asthma ([place])   Dyspnea   Abscess of buttock  COPD exacerbation -With diminished air entry, diffuse wheezing, being suspicious for sleep exacerbation, will start on IV Solu-Medrol, scheduled DuoNeb, continue with doxycycline and Rocephin.   CAP He is started on Rocephin and Doxycycline for antibiotic coverage.  Procalcitonin is ordered by ER physician and is pending.     Abscess of buttock -Patient with a ED visit on 8/12 significant for perineal abscess status post I&D by ED, apparently deep 3.5 cm, and has been changed nurse earlier today, who reports that some tunneling and may be extra pocket of fluid collection, general surgery were  consulted for further evaluation, he is covered with doxycycline.    Dyspnea Continue flovent discus, albuterol MDI as needed. Duoneb every 6 hours as needed for SOB Incentive spirometer every two hours while awake.     Essential hypertension Continue with norvasc, ARB therapy. Avapro substituted for diovan per formulary. Monitor BP.     Atrial fibrillation Chronic atrial fibrillation anticoagulated on Xarelto which is continued      OBSTRUCTIVE SLEEP APNEA CPAP at night     Anticoagulant long-term use Continue Xarelto      Prolonged QT interval Avoid medications with could further prolong QT interval     DVT prophylaxis: Xarelto Code Status: Full Family Communication: wife at bedside Disposition:   Status is: Inpatient  Remains inpatient appropriate because:IV treatments appropriate due to intensity of illness or inability to take PO  Dispo: The patient is from: Home              Anticipated d/c is to: Home              Patient currently is not medically stable to d/c.   Difficult to place patient No       Consultants:  General surgery   Subjective:  Dyspnea, cough and congestion.  Objective: Vitals:   05/25/21 0358 05/25/21 0745 05/25/21 1030 05/25/21 1130  BP:  (!) 117/50 (!) 141/80 139/82  Pulse:  78 71 72  Resp:  '16 16 18  '$ Temp:  98 F (36.7 C)    TempSrc:  Oral    SpO2:  97% 95% 97%  Weight: 115.8 kg     Height: '5\' 10"'$  (1.778 m)      No intake or output data in the 24 hours ending 05/25/21 1156 Filed Weights   05/25/21 0358  Weight: 115.8 kg    Examination:  General exam: Appears calm and comfortable  Respiratory system: Diminished air entry with diffuse wheezing Cardiovascular system: S1 & S2 heard, RRR. No JVD, murmurs, rubs, gallops or clicks. No pedal edema. Gastrointestinal system: Abdomen is nondistended, soft and nontender. No organomegaly or masses felt. Normal bowel sounds heard. Central nervous system: Alert and oriented. No focal neurological deficits. Extremities: Symmetric 5 x 5 power. Skin: No rashes, lesions or ulcers Psychiatry: Judgement and insight appear normal. Mood & affect appropriate.        Data Reviewed: I have personally reviewed following labs and imaging studies  CBC: Recent Labs  Lab 05/24/21 1439 05/25/21 0025  WBC 4.7 5.1  NEUTROABS 3.8 4.3  HGB 13.3 12.7*  HCT 41.4 37.7*  MCV 90.2 89.5  PLT 164 [id number]    Basic Metabolic Panel: Recent Labs  Lab 05/24/21 1439 05/25/21 0025  NA 138 139  K 3.6 3.8  CL 101 101  CO2 25 26  GLUCOSE 114* 126*  BUN 11 10  CREATININE 0.95 0.97  CALCIUM 8.9 8.7*     GFR: Estimated Creatinine Clearance: 71 mL/min (by C-G formula based on SCr of 0.97 mg/dL).  Liver Function Tests: Recent Labs  Lab 05/24/21 1439  AST 18  ALT 17  ALKPHOS 51  BILITOT 1.0  PROT 6.6  ALBUMIN 3.6    CBG: No results for input(s): GLUCAP in the last 168 hours.   Recent Results (from the past 240 hour(s))  Resp Panel by RT-PCR (Flu A&B, Covid) Nasopharyngeal Swab     Status: None   Collection Time: 05/25/21  2:12 AM   Specimen: Nasopharyngeal Swab; Nasopharyngeal(NP) swabs in vial transport medium  Result Value  Ref Range Status   SARS Coronavirus 2 by RT PCR NEGATIVE NEGATIVE Final    Comment: (NOTE) SARS-CoV-2 target nucleic acids are NOT DETECTED.  The SARS-CoV-2 RNA is generally detectable in upper respiratory specimens during the acute phase of infection. The lowest concentration of SARS-CoV-2 viral copies this assay can detect is 138 copies/mL. A negative result does not preclude SARS-Cov-2 infection and should not be used as the sole basis for treatment or other patient management decisions. A negative result may occur with  improper specimen collection/handling, submission of specimen other than nasopharyngeal swab, presence of viral mutation(s) within the areas targeted by this assay, and inadequate number of viral copies(<138 copies/mL). A negative result must be combined with clinical observations, patient history, and epidemiological information. The expected result is Negative.  Fact Sheet for Patients:  EntrepreneurPulse.com.au  Fact Sheet for Healthcare Providers:  IncredibleEmployment.be  This test is no t yet approved or cleared by the [place] FDA and  has been authorized for detection and/or diagnosis of SARS-CoV-2 by FDA under an Emergency Use Authorization (EUA). This EUA will remain  in effect (meaning this test can be used) for the duration of the COVID-19 declaration under Section  564(b)(1) of the Act, 21 U.S.C.section 360bbb-3(b)(1), unless the authorization is terminated  or revoked sooner.       Influenza A by PCR NEGATIVE NEGATIVE Final   Influenza B by PCR NEGATIVE NEGATIVE Final    Comment: (NOTE) The Xpert Xpress SARS-CoV-2/FLU/RSV plus assay is intended as an aid in the diagnosis of influenza from Nasopharyngeal swab specimens and should not be used as a sole basis for treatment. Nasal washings and aspirates are unacceptable for Xpert Xpress SARS-CoV-2/FLU/RSV testing.  Fact Sheet for Patients: EntrepreneurPulse.com.au  Fact Sheet for Healthcare Providers: IncredibleEmployment.be  This test is not yet approved or cleared by the [place] FDA and has been authorized for detection and/or diagnosis of SARS-CoV-2 by FDA under an Emergency Use Authorization (EUA). This EUA will remain in effect (meaning this test can be used) for the duration of the COVID-19 declaration under Section 564(b)(1) of the Act, 21 U.S.C. section 360bbb-3(b)(1), unless the authorization is terminated or revoked.  Performed at [facility], [place] [address], [place], [place] [address]          Radiology Studies: CT CHEST W CONTRAST  Result Date: 05/25/2021 CLINICAL DATA:  Recent perianal abscess drainage.  Asthma. EXAM: CT CHEST WITH CONTRAST TECHNIQUE: Multidetector CT imaging of the chest was performed during intravenous contrast administration. CONTRAST:  21m OMNIPAQUE IOHEXOL 350 MG/ML SOLN COMPARISON:  None. FINDINGS: Cardiovascular: Coronary artery calcification and aortic atherosclerotic calcification. Pacer wires in RIGHT heart. Mediastinum/Nodes: No axillary or supraclavicular adenopathy. No mediastinal or hilar adenopathy. No pericardial fluid. Esophagus normal. Lungs/Pleura: There is mild bibasilar ground-glass opacities consistent with atelectasis. Band of atelectasis in the RIGHT middle lobe. No airspace disease. No  pneumothorax. Mild centrilobular emphysema the upper lobes. Upper Abdomen: Limited view of the liver, kidneys, pancreas are unremarkable. Normal adrenal glands. Musculoskeletal: Degenerative osteophytosis of the spine. IMPRESSION: 1. No acute cardiopulmonary findings. 2. Mild basilar atelectasis. 3. Coronary artery calcification and Aortic Atherosclerosis (ICD10-I70.0). Electronically Signed   By: SSuzy BouchardM.D.   On: 05/25/2021 07:30   DG Chest Port 1 View  Result Date: 05/25/2021 CLINICAL DATA:  Weakness. EXAM: PORTABLE CHEST 1 VIEW COMPARISON:  November 21, 2018 FINDINGS: There is a dual lead AICD. Mild areas of atelectasis and/or early infiltrate are seen in the bilateral  lung bases. The heart size and mediastinal contours are within normal limits. The visualized skeletal structures are unremarkable. IMPRESSION: Mild bibasilar atelectasis and/or early infiltrate. Electronically Signed   By: Virgina Norfolk M.D.   On: 05/25/2021 00:38        Scheduled Meds: Continuous Infusions:   LOS: 0 days     Phillips Climes, MD [facility]   To contact the attending provider between 7A-7P or the covering provider during after hours 7P-7A, please log into the web site [email or web address] and access using universal [place] password for that web site. If you do not have the password, please call the hospital operator.  05/25/2021, 11:56 AM

## 2021-05-25 NOTE — ED Notes (Signed)
Pt to [facility]. Continent of x1 urine and 1 small stool. Packing to perianal wound fell out during this process. RN re-packed would with a significant length of iodoform; wound appears to tunnel and have several pockets going in different direction. RN did message Dr. Waldron Labs regarding the depth/complexity of this wound, asked about WOCN consult, awaiting response.

## 2021-05-25 NOTE — ED Notes (Signed)
Pt ambulated to the BR on pulse ox and dropped to 96%. PT was extremely SOB just walking a couple doors down. MD notified

## 2021-05-26 ENCOUNTER — Ambulatory Visit: Payer: Medicare Other | Admitting: Nurse Practitioner

## 2021-05-26 DIAGNOSIS — I480 Paroxysmal atrial fibrillation: Secondary | ICD-10-CM

## 2021-05-26 DIAGNOSIS — J449 Chronic obstructive pulmonary disease, unspecified: Secondary | ICD-10-CM

## 2021-05-26 LAB — CBC
HCT: 39.5 % (ref 39.0–52.0)
Hemoglobin: 13 g/dL (ref 13.0–17.0)
MCH: 29.2 pg (ref 26.0–34.0)
MCHC: 32.9 g/dL (ref 30.0–36.0)
MCV: 88.8 fL (ref 80.0–100.0)
Platelets: 201 10*3/uL (ref 150–400)
RBC: 4.45 MIL/uL (ref 4.22–5.81)
RDW: 13.7 % (ref 11.5–15.5)
WBC: 5.5 10*3/uL (ref 4.0–10.5)
nRBC: 0 % (ref 0.0–0.2)

## 2021-05-26 LAB — BASIC METABOLIC PANEL
Anion gap: 10 (ref 5–15)
BUN: 15 mg/dL (ref 8–23)
CO2: 27 mmol/L (ref 22–32)
Calcium: 8.8 mg/dL — ABNORMAL LOW (ref 8.9–10.3)
Chloride: 101 mmol/L (ref 98–111)
Creatinine, Ser: 0.93 mg/dL (ref 0.61–1.24)
GFR, Estimated: >60 mL/min (ref >60)
Glucose, Bld: 152 mg/dL — ABNORMAL HIGH (ref 70–99)
Potassium: 4.1 mmol/L (ref 3.5–5.1)
Sodium: 138 mmol/L (ref 135–145)

## 2021-05-26 MED ORDER — ENOXAPARIN SODIUM 40 MG/0.4ML IJ SOSY
40.0000 mg | PREFILLED_SYRINGE | INTRAMUSCULAR | Status: AC
Start: 2021-05-26 — End: 2021-05-26
  Administered 2021-05-26: 40 mg via SUBCUTANEOUS
  Filled 2021-05-26: qty 0.4

## 2021-05-26 MED ORDER — DOCUSATE SODIUM 100 MG PO CAPS
100.0000 mg | ORAL_CAPSULE | Freq: Two times a day (BID) | ORAL | Status: DC
  Administered 2021-05-26 (×2): 100 mg via ORAL
  Filled 2021-05-26 (×4): qty 1

## 2021-05-26 NOTE — Progress Notes (Signed)
CPAP set up at bedside , FFM,with auto titrate (5-15 cm H20) per progress notes. RN to place on after bath. Aware to call RT if assistance needed. Receiving RT aware.

## 2021-05-26 NOTE — ED Notes (Signed)
Alert, NAD, calm, using cell phone

## 2021-05-26 NOTE — Evaluation (Signed)
Physical Therapy Evaluation Patient Details Name: Guy Moore MRN: [id number] DOB: Jun 12, 1934 Today's Date: 05/26/2021   History of Present Illness  Pt is an 87 y/o male admitted 8/14 following increased bleeding from perianal abscess. Pt had previously presented to ED for abscess on 8/13 and had I and D. Pt also found to have PNA. PMH includes HTN, a jfib, COPD, and DM.  Clinical Impression  Pt admitted secondary to problem above with deficits below. Pt requiring min guard A for transfers and gait this session. Mild unsteadiness noted, but no overt LOB noted. Anticipate pt will progress well and will not require PT follow up. Will continue to follow acutely to maximize functional mobility independence and safety.     Follow Up Recommendations No PT follow up    Equipment Recommendations  None recommended by PT    Recommendations for Other Services       Precautions / Restrictions Precautions Precautions: Fall Restrictions Weight Bearing Restrictions: No      Mobility  Bed Mobility Overal bed mobility: Needs Assistance Bed Mobility: Supine to Sit;Sit to Supine     Supine to sit: Supervision Sit to supine: Min assist   General bed mobility comments: Required min A to get LE back onto stretcher    Transfers Overall transfer level: Needs assistance Equipment used: None Transfers: Sit to/from Stand Sit to Stand: Min guard         General transfer comment: Min guard for safety.  Ambulation/Gait Ambulation/Gait assistance: Min guard Gait Distance (Feet): 150 Feet Assistive device: None Gait Pattern/deviations: Step-through pattern;Decreased stride length;Shuffle Gait velocity: Decreased   General Gait Details: Short, shuffle type steps. No overt LOB noted. Min guard for safety.  Stairs            Wheelchair Mobility    Modified Rankin (Stroke Patients Only)       Balance Overall balance assessment: Mild deficits observed, not formally tested                                            Pertinent Vitals/Pain Pain Assessment: Faces Faces Pain Scale: Hurts little more Pain Location: bottom Pain Descriptors / Indicators: Guarding;Grimacing Pain Intervention(s): Limited activity within patient's tolerance;Monitored during session    Home Living Family/patient expects to be discharged to:: Private residence Living Arrangements: Spouse/significant other Available Help at Discharge: Family;Available 24 hours/day Type of Home: House Home Access: Stairs to enter Entrance Stairs-Rails: Right Entrance Stairs-Number of Steps: 4 Home Layout: Two level Home Equipment: Walker - 2 wheels      Prior Function Level of Independence: Independent               Hand Dominance        Extremity/Trunk Assessment   Upper Extremity Assessment Upper Extremity Assessment: Generalized weakness    Lower Extremity Assessment Lower Extremity Assessment: Generalized weakness    Cervical / Trunk Assessment Cervical / Trunk Assessment: Normal  Communication   Communication: HOH  Cognition Arousal/Alertness: Awake/alert Behavior During Therapy: WFL for tasks assessed/performed Overall Cognitive Status: Within Functional Limits for tasks assessed                                        General Comments      Exercises     Assessment/Plan  PT Assessment Patient needs continued PT services  PT Problem List Decreased strength;Decreased activity tolerance;Decreased mobility;Decreased balance       PT Treatment Interventions Gait training;Stair training;Functional mobility training;Therapeutic activities;Therapeutic exercise;Balance training;Patient/family education    PT Goals (Current goals can be found in the Care Plan section)  Acute Rehab PT Goals Patient Stated Goal: to go home PT Goal Formulation: With patient Time For Goal Achievement: 06/09/21 Potential to Achieve Goals: Good     Frequency Min 3X/week   Barriers to discharge        Co-evaluation               AM-PAC PT "6 Clicks" Mobility  Outcome Measure Help needed turning from your back to your side while in a flat bed without using bedrails?: None Help needed moving from lying on your back to sitting on the side of a flat bed without using bedrails?: None Help needed moving to and from a bed to a chair (including a wheelchair)?: A Little Help needed standing up from a chair using your arms (e.g., wheelchair or bedside chair)?: A Little Help needed to walk in hospital room?: A Little Help needed climbing 3-5 steps with a railing? : A Little 6 Click Score: 20    End of Session   Activity Tolerance: Patient tolerated treatment well Patient left: in bed;with call bell/phone within reach (on stretcher on ED) Nurse Communication: Mobility status PT Visit Diagnosis: Other abnormalities of gait and mobility (R26.89);Muscle weakness (generalized) (M62.81)    Time: [id number] PT Time Calculation (min) (ACUTE ONLY): 34 min   Charges:   PT Evaluation $PT Eval Low Complexity: 1 Low PT Treatments $Gait Training: 8-22 mins        Lou Miner, DPT  Acute Rehabilitation Services  Pager: [phone] Office: [phone]   Rudean Hitt 05/26/2021, 1:49 PM

## 2021-05-26 NOTE — ED Notes (Signed)
Pt IV is not working. I will message provider about medication that was seeping out as administering. IV will be removed and replaced.

## 2021-05-26 NOTE — ED Notes (Signed)
Pt alert, NAD, calm, interactive, resps e/u, speaking in clear complete sentences, VSS, BP elevated, moved to room temporarily for surgery to see wound. Denies pain, sob, nausea.

## 2021-05-26 NOTE — ED Notes (Signed)
Up to b/r and walking with PT. Reported that sacral wound recovered.

## 2021-05-26 NOTE — Progress Notes (Addendum)
Subjective: CC: Asked for patient to be moved into a room so I could examine his wound. His step daughter is a SLP here and was updated with his permission.   No pain to the perineum. No further bleeding.   Objective: Vital signs in last 24 hours: Temp:  [98 F (36.7 C)] 98 F (36.7 C) (08/15 0823) Pulse Rate:  [69-91] 75 (08/15 0823) Resp:  [13-26] 18 (08/15 0823) BP: (97-159)/(62-137) 144/81 (08/15 0823) SpO2:  [91 %-98 %] 94 % (08/15 0823)    Intake/Output from previous day: No intake/output data recorded. Intake/Output this shift: No intake/output data recorded.  PE: Gen:  Alert, NAD, pleasant Card:  Reg Pulm:  CTAB, no W/R/R, effort normal Abd: Soft, ND, NT +BS GU: Perineal incision open, 2.5cm long & ~4.5cm deep. No bleeding, drainage/purulence. Minimal surrounding induration w/o erythema or heat.  Ext:  No LE edema Psych: A&Ox3  Skin: no rashes noted, warm and dry  Lab Results:  Recent Labs    05/24/21 1439 05/25/21 0025  WBC 4.7 5.1  HGB 13.3 12.7*  HCT 41.4 37.7*  PLT 164 166   BMET Recent Labs    05/24/21 1439 05/25/21 0025  NA 138 139  K 3.6 3.8  CL 101 101  CO2 25 26  GLUCOSE 114* 126*  BUN 11 10  CREATININE 0.95 0.97  CALCIUM 8.9 8.7*   PT/INR No results for input(s): LABPROT, INR in the last 72 hours. CMP     Component Value Date/Time   NA 139 05/25/2021 0025   NA 141 06/27/2015 0000   K 3.8 05/25/2021 0025   CL 101 05/25/2021 0025   CO2 26 05/25/2021 0025   GLUCOSE 126 (H) 05/25/2021 0025   BUN 10 05/25/2021 0025   BUN 21 06/27/2015 0000   CREATININE 0.97 05/25/2021 0025   CREATININE 1.07 02/24/2021 1333   CALCIUM 8.7 (L) 05/25/2021 0025   PROT 6.6 05/24/2021 1439   ALBUMIN 3.6 05/24/2021 1439   AST 18 05/24/2021 1439   AST 18 02/24/2021 1333   ALT 17 05/24/2021 1439   ALT 14 02/24/2021 1333   ALKPHOS 51 05/24/2021 1439   BILITOT 1.0 05/24/2021 1439   BILITOT 0.9 02/24/2021 1333   GFRNONAA >60 05/25/2021 0025    GFRNONAA >60 02/24/2021 1333   GFRAA >60 02/26/2020 1329   Lipase     Component Value Date/Time   LIPASE 41 11/21/2018 1630    Studies/Results: CT CHEST W CONTRAST  Result Date: 05/25/2021 CLINICAL DATA:  Recent perianal abscess drainage.  Asthma. EXAM: CT CHEST WITH CONTRAST TECHNIQUE: Multidetector CT imaging of the chest was performed during intravenous contrast administration. CONTRAST:  9m OMNIPAQUE IOHEXOL 350 MG/ML SOLN COMPARISON:  None. FINDINGS: Cardiovascular: Coronary artery calcification and aortic atherosclerotic calcification. Pacer wires in RIGHT heart. Mediastinum/Nodes: No axillary or supraclavicular adenopathy. No mediastinal or hilar adenopathy. No pericardial fluid. Esophagus normal. Lungs/Pleura: There is mild bibasilar ground-glass opacities consistent with atelectasis. Band of atelectasis in the RIGHT middle lobe. No airspace disease. No pneumothorax. Mild centrilobular emphysema the upper lobes. Upper Abdomen: Limited view of the liver, kidneys, pancreas are unremarkable. Normal adrenal glands. Musculoskeletal: Degenerative osteophytosis of the spine. IMPRESSION: 1. No acute cardiopulmonary findings. 2. Mild basilar atelectasis. 3. Coronary artery calcification and Aortic Atherosclerosis (ICD10-I70.0). Electronically Signed   By: SSuzy BouchardM.D.   On: 05/25/2021 07:30   DG Chest Port 1 View  Result Date: 05/25/2021 CLINICAL DATA:  Weakness. EXAM: PORTABLE CHEST 1  VIEW COMPARISON:  November 21, 2018 FINDINGS: There is a dual lead AICD. Mild areas of atelectasis and/or early infiltrate are seen in the bilateral lung bases. The heart size and mediastinal contours are within normal limits. The visualized skeletal structures are unremarkable. IMPRESSION: Mild bibasilar atelectasis and/or early infiltrate. Electronically Signed   By: Virgina Norfolk M.D.   On: 05/25/2021 00:38    Anti-infectives: Anti-infectives (From admission, onward)    Start     Dose/Rate Route  Frequency Ordered Stop   05/26/21 2200  cefTRIAXone (ROCEPHIN) 1 g in sodium chloride 0.9 % 100 mL IVPB        1 g 200 mL/hr over 30 Minutes Intravenous Every 24 hours 05/25/21 2111     05/25/21 2200  doxycycline (VIBRA-TABS) tablet 100 mg        100 mg Oral 2 times daily 05/25/21 2108     05/25/21 2115  cefTRIAXone (ROCEPHIN) 1 g in sodium chloride 0.9 % 100 mL IVPB  Status:  Discontinued        1 g 200 mL/hr over 30 Minutes Intravenous Every 24 hours 05/25/21 2108 05/25/21 2109   05/25/21 0515  cefTRIAXone (ROCEPHIN) 1 g in sodium chloride 0.9 % 100 mL IVPB        1 g 200 mL/hr over 30 Minutes Intravenous  Once 05/25/21 0508 05/25/21 0707   05/25/21 0515  doxycycline (VIBRA-TABS) tablet 100 mg        100 mg Oral  Once 05/25/21 [id number] 05/25/21 0525        Assessment/Plan Perineal abscess s/p I&D at bedside by EDP on 8/13 - Had some oozing/bleeding on 8/14 that stopped after surgicel and dressing - Packing out. No further bleeding. Start sitz baths - No indication for additional procedures/surgery - Cont abx. No cx's on file - Labs pending - Defer PNA/respiratory management to primary team - Will discuss with MD about following vs signing off today  FEN - HH VTE - SCDs, Xarelto on hold ID - Doxy/rocephin    LOS: 1 day    Jillyn Ledger , [facility] Surgery 05/26/2021, 8:43 AM Please see Amion for pager number during day hours 7:00am-4:30pm

## 2021-05-26 NOTE — ED Notes (Signed)
DIL at [facility]. Labs/rainbow sent.

## 2021-05-26 NOTE — Progress Notes (Signed)
PROGRESS NOTE    Guy Moore  [id number] DOB: 02/13/1934 DOA: 05/24/2021 PCP: Laurey Morale, MD    Chief Complaint  Patient presents with   Bleeding/Bruising    Brief Narrative:    Guy Moore is a 87 y.o. male with medical history significant for A-fib on xaraleto, COPD/Asthma, HTN who presents by EMS with bleeding from perianal abscess that was I&D'd yesterday in ER.  He reports after he went home he was doing well but then he noticed bleeding from the I&D the area when he went to have a bowel movement.  He states it was a copious mount of bleeding and was unable to initially stop it so EMS was called.  As well he does report some shortness of breath, he has significant wheezing on presentation, admitted for COPD decerebration, and he was evaluated by general surgery for his vision site.  Assessment & Plan:   Principal Problem:   CAP (community acquired pneumonia) Active Problems:   OBSTRUCTIVE SLEEP APNEA   Essential hypertension   Atrial fibrillation (HCC)   Anticoagulant long-term use   COPD with asthma ([place])   Dyspnea   Abscess of buttock  COPD exacerbation -Patient with known history of COPD as an outpatient, he has been followed by pulmonary as an outpatient Dr. Halford Chessman . -Wheezing has improved today, but it does remain significant, as well he is with some cough, will continue with current dose IV steroids with no tapering . -Continue with the scheduled duo nebs . - continue with doxycycline and Rocephin. -Pneumonia has been ruled out, initially suspicion for pneumonia given bibasilar opacities atelectasis versus pneumonia, but CT chest with no evidence of pneumonia.  Abscess of buttock -Patient with a ED visit on 8/12 significant for perineal abscess status post I&D by ED, apparently deep 3.5 cm. -General surgery input greatly appreciated, at this point no indication for wound packing, continue with sitz baths only, - hold his Xarelto for next 24 hours .      Dyspnea Continue flovent discus, albuterol MDI as needed. Duoneb every 6 hours as needed for SOB Incentive spirometer every two hours while awake.     Essential hypertension Continue with norvasc, ARB therapy. Avapro substituted for diovan per formulary. Monitor BP.     Atrial fibrillation Chronic atrial fibrillation anticoagulated on Xarelto which is continued      OBSTRUCTIVE SLEEP APNEA CPAP at night     Anticoagulant long-term use Continue Xarelto      Prolonged QT interval Avoid medications with could further prolong QT interval    DVT prophylaxis: Xarelto remains on hold, will keep on DVT prophylaxis till it is resumed. Code Status: Full Family Communication: none at bedside Disposition:   Status is: Inpatient  Remains inpatient appropriate because:IV treatments appropriate due to intensity of illness or inability to take PO  Dispo: The patient is from: Home              Anticipated d/c is to: Home              Patient currently is not medically stable to d/c.   Difficult to place patient No       Consultants:  General surgery   Subjective:  Dyspnea, cough and congestion.  Objective: Vitals:   05/26/21 0230 05/26/21 0345 05/26/21 0823 05/26/21 1531  BP: 118/70 120/74 (!) 144/81 (!) 115/54  Pulse: 69 71 75 74  Resp: '16 16 18 18  '$ Temp:   98 F (36.7 C) 97.7  F (36.5 C)  TempSrc:   Oral Oral  SpO2: 93% 96% 94% 94%  Weight:      Height:       No intake or output data in the 24 hours ending 05/26/21 1538 Filed Weights   05/25/21 0358  Weight: 115.8 kg    Examination:   Awake Alert, Oriented X 3, No new F.N deficits, Normal affect Symmetrical Chest wall movement, improved air entry bilaterally, but he remains with significant wheezing. RRR,No Gallops,Rubs or new Murmurs, No Parasternal Heave +ve B.Sounds, Abd Soft, No tenderness, No rebound - guarding or rigidity. No Cyanosis, Clubbing or edema, No new Rash or bruise         Data  Reviewed: I have personally reviewed following labs and imaging studies  CBC: Recent Labs  Lab 05/24/21 1439 05/25/21 0025 05/26/21 0900  WBC 4.7 5.1 5.5  NEUTROABS 3.8 4.3  --   HGB 13.3 12.7* 13.0  HCT 41.4 37.7* 39.5  MCV 90.2 89.5 88.8  PLT 164 166 201     Basic Metabolic Panel: Recent Labs  Lab 05/24/21 1439 05/25/21 0025 05/26/21 0900  NA 138 139 138  K 3.6 3.8 4.1  CL 101 101 101  CO2 '25 26 27  '$ GLUCOSE 114* 126* 152*  BUN '11 10 15  '$ CREATININE 0.95 0.97 0.93  CALCIUM 8.9 8.7* 8.8*     GFR: Estimated Creatinine Clearance: 74 mL/min (by C-G formula based on SCr of 0.93 mg/dL).  Liver Function Tests: Recent Labs  Lab 05/24/21 1439  AST 18  ALT 17  ALKPHOS 51  BILITOT 1.0  PROT 6.6  ALBUMIN 3.6     CBG: No results for input(s): GLUCAP in the last 168 hours.   Recent Results (from the past 240 hour(s))  Resp Panel by RT-PCR (Flu A&B, Covid) Nasopharyngeal Swab     Status: None   Collection Time: 05/25/21  2:12 AM   Specimen: Nasopharyngeal Swab; Nasopharyngeal(NP) swabs in vial transport medium  Result Value Ref Range Status   SARS Coronavirus 2 by RT PCR NEGATIVE NEGATIVE Final    Comment: (NOTE) SARS-CoV-2 target nucleic acids are NOT DETECTED.  The SARS-CoV-2 RNA is generally detectable in upper respiratory specimens during the acute phase of infection. The lowest concentration of SARS-CoV-2 viral copies this assay can detect is 138 copies/mL. A negative result does not preclude SARS-Cov-2 infection and should not be used as the sole basis for treatment or other patient management decisions. A negative result may occur with  improper specimen collection/handling, submission of specimen other than nasopharyngeal swab, presence of viral mutation(s) within the areas targeted by this assay, and inadequate number of viral copies(<138 copies/mL). A negative result must be combined with clinical observations, patient history, and  epidemiological information. The expected result is Negative.  Fact Sheet for Patients:  EntrepreneurPulse.com.au  Fact Sheet for Healthcare Providers:  IncredibleEmployment.be  This test is no t yet approved or cleared by the [place] FDA and  has been authorized for detection and/or diagnosis of SARS-CoV-2 by FDA under an Emergency Use Authorization (EUA). This EUA will remain  in effect (meaning this test can be used) for the duration of the COVID-19 declaration under Section 564(b)(1) of the Act, 21 U.S.C.section 360bbb-3(b)(1), unless the authorization is terminated  or revoked sooner.       Influenza A by PCR NEGATIVE NEGATIVE Final   Influenza B by PCR NEGATIVE NEGATIVE Final    Comment: (NOTE) The Xpert Xpress SARS-CoV-2/FLU/RSV plus assay is intended  as an aid in the diagnosis of influenza from Nasopharyngeal swab specimens and should not be used as a sole basis for treatment. Nasal washings and aspirates are unacceptable for Xpert Xpress SARS-CoV-2/FLU/RSV testing.  Fact Sheet for Patients: EntrepreneurPulse.com.au  Fact Sheet for Healthcare Providers: IncredibleEmployment.be  This test is not yet approved or cleared by the [place] FDA and has been authorized for detection and/or diagnosis of SARS-CoV-2 by FDA under an Emergency Use Authorization (EUA). This EUA will remain in effect (meaning this test can be used) for the duration of the COVID-19 declaration under Section 564(b)(1) of the Act, 21 U.S.C. section 360bbb-3(b)(1), unless the authorization is terminated or revoked.  Performed at [facility], [place] [address] [place], [place] [address]           Radiology Studies: CT CHEST W CONTRAST  Result Date: 05/25/2021 CLINICAL DATA:  Recent perianal abscess drainage.  Asthma. EXAM: CT CHEST WITH CONTRAST TECHNIQUE: Multidetector CT imaging of the chest was  performed during intravenous contrast administration. CONTRAST:  74m OMNIPAQUE IOHEXOL 350 MG/ML SOLN COMPARISON:  None. FINDINGS: Cardiovascular: Coronary artery calcification and aortic atherosclerotic calcification. Pacer wires in RIGHT heart. Mediastinum/Nodes: No axillary or supraclavicular adenopathy. No mediastinal or hilar adenopathy. No pericardial fluid. Esophagus normal. Lungs/Pleura: There is mild bibasilar ground-glass opacities consistent with atelectasis. Band of atelectasis in the RIGHT middle lobe. No airspace disease. No pneumothorax. Mild centrilobular emphysema the upper lobes. Upper Abdomen: Limited view of the liver, kidneys, pancreas are unremarkable. Normal adrenal glands. Musculoskeletal: Degenerative osteophytosis of the spine. IMPRESSION: 1. No acute cardiopulmonary findings. 2. Mild basilar atelectasis. 3. Coronary artery calcification and Aortic Atherosclerosis (ICD10-I70.0). Electronically Signed   By: SSuzy BouchardM.D.   On: 05/25/2021 07:30   DG Chest Port 1 View  Result Date: 05/25/2021 CLINICAL DATA:  Weakness. EXAM: PORTABLE CHEST 1 VIEW COMPARISON:  November 21, 2018 FINDINGS: There is a dual lead AICD. Mild areas of atelectasis and/or early infiltrate are seen in the bilateral lung bases. The heart size and mediastinal contours are within normal limits. The visualized skeletal structures are unremarkable. IMPRESSION: Mild bibasilar atelectasis and/or early infiltrate. Electronically Signed   By: TVirgina NorfolkM.D.   On: 05/25/2021 00:38        Scheduled Meds: Continuous Infusions:   LOS: 1 day     DPhillips Climes MD [facility]   To contact the attending provider between 7A-7P or the covering provider during after hours 7P-7A, please log into the web site [email or web address] and access using universal [place] password for that web site. If you do not have the password, please call the hospital operator.  05/26/2021, 3:38 PM

## 2021-05-27 DIAGNOSIS — L0231 Cutaneous abscess of buttock: Secondary | ICD-10-CM

## 2021-05-27 LAB — BASIC METABOLIC PANEL
Anion gap: 11 (ref 5–15)
BUN: 19 mg/dL (ref 8–23)
CO2: 26 mmol/L (ref 22–32)
Calcium: 8.5 mg/dL — ABNORMAL LOW (ref 8.9–10.3)
Chloride: 98 mmol/L (ref 98–111)
Creatinine, Ser: 1.07 mg/dL (ref 0.61–1.24)
GFR, Estimated: >60 mL/min (ref >60)
Glucose, Bld: 173 mg/dL — ABNORMAL HIGH (ref 70–99)
Potassium: 3.7 mmol/L (ref 3.5–5.1)
Sodium: 135 mmol/L (ref 135–145)

## 2021-05-27 LAB — CBC
HCT: 35.3 % — ABNORMAL LOW (ref 39.0–52.0)
Hemoglobin: 11.8 g/dL — ABNORMAL LOW (ref 13.0–17.0)
MCH: 29.5 pg (ref 26.0–34.0)
MCHC: 33.4 g/dL (ref 30.0–36.0)
MCV: 88.3 fL (ref 80.0–100.0)
Platelets: 186 10*3/uL (ref 150–400)
RBC: 4 MIL/uL — ABNORMAL LOW (ref 4.22–5.81)
RDW: 13.9 % (ref 11.5–15.5)
WBC: 6.8 10*3/uL (ref 4.0–10.5)
nRBC: 0 % (ref 0.0–0.2)

## 2021-05-27 MED ORDER — IPRATROPIUM-ALBUTEROL 0.5-2.5 (3) MG/3ML IN SOLN
3.0000 mL | Freq: Four times a day (QID) | RESPIRATORY_TRACT | Status: DC
  Administered 2021-05-27: 3 mL via RESPIRATORY_TRACT
  Filled 2021-05-27 (×2): qty 3

## 2021-05-27 MED ORDER — PROSIGHT PO TABS
1.0000 | ORAL_TABLET | Freq: Every day | ORAL | Status: DC
  Administered 2021-05-27 – 2021-05-28 (×2): 1 via ORAL
  Filled 2021-05-27 (×2): qty 1

## 2021-05-27 MED ORDER — IPRATROPIUM-ALBUTEROL 0.5-2.5 (3) MG/3ML IN SOLN: 3.0000 mL | Freq: Four times a day (QID) | RESPIRATORY_TRACT | Status: DC

## 2021-05-27 MED ORDER — OCUVITE-LUTEIN PO CAPS: 1.0000 | ORAL_CAPSULE | Freq: Every day | ORAL | Status: DC

## 2021-05-27 MED ORDER — PANTOPRAZOLE SODIUM 40 MG PO TBEC
40.0000 mg | DELAYED_RELEASE_TABLET | Freq: Every day | ORAL | Status: DC
  Administered 2021-05-28: 40 mg via ORAL
  Filled 2021-05-27 (×2): qty 1

## 2021-05-27 MED ORDER — IPRATROPIUM-ALBUTEROL 0.5-2.5 (3) MG/3ML IN SOLN: 3.0000 mL | Freq: Two times a day (BID) | RESPIRATORY_TRACT | Status: DC

## 2021-05-27 NOTE — Progress Notes (Signed)
PROGRESS NOTE    Guy Moore  [id number] DOB: [birth year] DOA: 05/24/2021 PCP: Laurey Morale, MD    Chief Complaint  Patient presents with   Bleeding/Bruising    Brief Narrative:    Guy Moore is a 87 y.o. male with medical history significant for A-fib on xaraleto, COPD/Asthma, HTN who presents by EMS with bleeding from perianal abscess that was I&D'd yesterday in ER.  He reports after he went home he was doing well but then he noticed bleeding from the I&D the area when he went to have a bowel movement.  He states it was a copious mount of bleeding and was unable to initially stop it so EMS was called.  As well he does report some shortness of breath, he has significant wheezing on presentation, admitted for COPD decerebration, and he was evaluated by general surgery for his incision site.  Assessment & Plan:   Principal Problem:   CAP (community acquired pneumonia) Active Problems:   OBSTRUCTIVE SLEEP APNEA   Essential hypertension   Atrial fibrillation (HCC)   Anticoagulant long-term use   COPD with asthma ([place])   Dyspnea   Abscess of buttock  COPD exacerbation -Patient with known history of COPD as an outpatient, he has been followed by pulmonary as an outpatient Dr. Halford Chessman . -He is on IV steroids for his wheezing, but all appears to be improving, hopefully he can be transitioned to oral taper tomorrow . -he remains with  significant cough and phlegm, continue with Doxy and Rocephin. -Continue with the scheduled duo nebs . - continue with doxycycline and Rocephin. -Pneumonia has been ruled out, initially suspicion for pneumonia given bibasilar opacities atelectasis versus pneumonia, but CT chest with no evidence of pneumonia.  Abscess of buttock -Patient with a ED visit on 8/12 significant for perineal abscess status post I&D by ED, apparently deep 3.5 cm. -General surgery input greatly appreciated, at this point no indication for wound packing, continue with  sitz baths only, -it also remains on hold, bleeding significantly subsided/resolved, will resume tomorrow.  Meanwhile continue with DVT prophylaxis dose.     Dyspnea Continue flovent discus, albuterol MDI as needed. Duoneb every 6 hours as needed for SOB Incentive spirometer every two hours while awake.     Essential hypertension Continue with norvasc, ARB therapy. Avapro substituted for diovan per formulary. Monitor BP.     Atrial fibrillation Chronic atrial fibrillation anticoagulated on Xarelto which is continued      OBSTRUCTIVE SLEEP APNEA CPAP at night     Anticoagulant long-term use Continue Xarelto      Prolonged QT interval Avoid medications with could further prolong QT interval    DVT prophylaxis: Xarelto remains on hold, will keep on DVT prophylaxis till it is resumed. Code Status: Full Family Communication: none at bedside Disposition:   Status is: Inpatient  Remains inpatient appropriate because:IV treatments appropriate due to intensity of illness or inability to take PO  Dispo: The patient is from: Home              Anticipated d/c is to: Home              Patient currently is not medically stable to d/c.   Difficult to place patient No       Consultants:  General surgery   Subjective: reports poor night sleep due to dyspnea and cough.  Objective: Vitals:   05/27/21 0850 05/27/21 0852 05/27/21 1136 05/27/21 1148  BP:    Marland Kitchen)  152/80  Pulse:    93  Resp:    19  Temp:    98 F (36.7 C)  TempSrc:    Axillary  SpO2: 93% 93% 93% 93%  Weight:      Height:        Intake/Output Summary (Last 24 hours) at 05/27/2021 1409 Last data filed at 05/27/2021 1338 Gross per 24 hour  Intake 1898.8 ml  Output 1175 ml  Net 723.8 ml   Filed Weights   05/25/21 0358 05/26/21 1808  Weight: 115.8 kg 112.7 kg    Examination:   Awake Alert, Oriented X 3, No new F.N deficits, Normal affect Symmetrical Chest wall movement, hands with wheezing, but is not  in any respiratory distress. RRR,No Gallops,Rubs or new Murmurs, No Parasternal Heave +ve B.Sounds, Abd Soft, No tenderness, No rebound - guarding or rigidity. No Cyanosis, Clubbing or edema, No new Rash or bruise          Data Reviewed: I have personally reviewed following labs and imaging studies  CBC: Recent Labs  Lab 05/24/21 1439 05/25/21 0025 05/26/21 0900 05/27/21 0258  WBC 4.7 5.1 5.5 6.8  NEUTROABS 3.8 4.3  --   --   HGB 13.3 12.7* 13.0 11.8*  HCT 41.4 37.7* 39.5 35.3*  MCV 90.2 89.5 88.8 88.3  PLT 164 166 201 [id number]    Basic Metabolic Panel: Recent Labs  Lab 05/24/21 1439 05/25/21 0025 05/26/21 0900 05/27/21 0258  NA 138 139 138 135  K 3.6 3.8 4.1 3.7  CL 101 101 101 98  CO2 '25 26 27 26  '$ GLUCOSE 114* 126* 152* 173*  BUN '11 10 15 19  '$ CREATININE 0.95 0.97 0.93 1.07  CALCIUM 8.9 8.7* 8.8* 8.5*    GFR: Estimated Creatinine Clearance: 63.5 mL/min (by C-G formula based on SCr of 1.07 mg/dL).  Liver Function Tests: Recent Labs  Lab 05/24/21 1439  AST 18  ALT 17  ALKPHOS 51  BILITOT 1.0  PROT 6.6  ALBUMIN 3.6    CBG: No results for input(s): GLUCAP in the last 168 hours.   Recent Results (from the past 240 hour(s))  Resp Panel by RT-PCR (Flu A&B, Covid) Nasopharyngeal Swab     Status: None   Collection Time: 05/25/21  2:12 AM   Specimen: Nasopharyngeal Swab; Nasopharyngeal(NP) swabs in vial transport medium  Result Value Ref Range Status   SARS Coronavirus 2 by RT PCR NEGATIVE NEGATIVE Final    Comment: (NOTE) SARS-CoV-2 target nucleic acids are NOT DETECTED.  The SARS-CoV-2 RNA is generally detectable in upper respiratory specimens during the acute phase of infection. The lowest concentration of SARS-CoV-2 viral copies this assay can detect is 138 copies/mL. A negative result does not preclude SARS-Cov-2 infection and should not be used as the sole basis for treatment or other patient management decisions. A negative result may occur with   improper specimen collection/handling, submission of specimen other than nasopharyngeal swab, presence of viral mutation(s) within the areas targeted by this assay, and inadequate number of viral copies(<138 copies/mL). A negative result must be combined with clinical observations, patient history, and epidemiological information. The expected result is Negative.  Fact Sheet for Patients:  EntrepreneurPulse.com.au  Fact Sheet for Healthcare Providers:  IncredibleEmployment.be  This test is no t yet approved or cleared by the [place] FDA and  has been authorized for detection and/or diagnosis of SARS-CoV-2 by FDA under an Emergency Use Authorization (EUA). This EUA will remain  in effect (meaning this test can  be used) for the duration of the COVID-19 declaration under Section 564(b)(1) of the Act, 21 U.S.C.section 360bbb-3(b)(1), unless the authorization is terminated  or revoked sooner.       Influenza A by PCR NEGATIVE NEGATIVE Final   Influenza B by PCR NEGATIVE NEGATIVE Final    Comment: (NOTE) The Xpert Xpress SARS-CoV-2/FLU/RSV plus assay is intended as an aid in the diagnosis of influenza from Nasopharyngeal swab specimens and should not be used as a sole basis for treatment. Nasal washings and aspirates are unacceptable for Xpert Xpress SARS-CoV-2/FLU/RSV testing.  Fact Sheet for Patients: EntrepreneurPulse.com.au  Fact Sheet for Healthcare Providers: IncredibleEmployment.be  This test is not yet approved or cleared by the [place] FDA and has been authorized for detection and/or diagnosis of SARS-CoV-2 by FDA under an Emergency Use Authorization (EUA). This EUA will remain in effect (meaning this test can be used) for the duration of the COVID-19 declaration under Section 564(b)(1) of the Act, 21 U.S.C. section 360bbb-3(b)(1), unless the authorization is terminated  or revoked.  Performed at [facility], [place] [address] [place], [place] [address]          Radiology Studies: No results found.      Scheduled Meds: Continuous Infusions:   LOS: 2 days     Phillips Climes, MD [facility]   To contact the attending provider between 7A-7P or the covering provider during after hours 7P-7A, please log into the web site [email or web address] and access using universal [place] password for that web site. If you do not have the password, please call the hospital operator.  05/27/2021, 2:09 PM

## 2021-05-27 NOTE — Progress Notes (Signed)
RT note. Patient placed on auto cpap 15/5 sat 95%, RT will continue to monitor

## 2021-05-28 DIAGNOSIS — J441 Chronic obstructive pulmonary disease with (acute) exacerbation: Secondary | ICD-10-CM

## 2021-05-28 DIAGNOSIS — Z7901 Long term (current) use of anticoagulants: Secondary | ICD-10-CM

## 2021-05-28 MED ORDER — ACETAMINOPHEN 325 MG PO TABS: 650.0000 mg | ORAL_TABLET | Freq: Four times a day (QID) | ORAL | Status: DC | PRN

## 2021-05-28 MED ORDER — PREDNISONE 10 MG (21) PO TBPK: ORAL_TABLET | ORAL | 0 refills | Status: DC

## 2021-05-28 MED ORDER — GUAIFENESIN ER 600 MG PO TB12: 600.0000 mg | ORAL_TABLET | Freq: Two times a day (BID) | ORAL | 0 refills | Status: AC

## 2021-05-28 MED ORDER — IPRATROPIUM-ALBUTEROL 0.5-2.5 (3) MG/3ML IN SOLN
3.0000 mL | Freq: Three times a day (TID) | RESPIRATORY_TRACT | Status: DC
  Administered 2021-05-28: 3 mL via RESPIRATORY_TRACT
  Filled 2021-05-28: qty 3

## 2021-05-28 MED ORDER — IPRATROPIUM-ALBUTEROL 0.5-2.5 (3) MG/3ML IN SOLN: 3.0000 mL | Freq: Four times a day (QID) | RESPIRATORY_TRACT | 1 refills | Status: DC | PRN

## 2021-05-28 MED ORDER — DOXYCYCLINE HYCLATE 100 MG PO CAPS
100.0000 mg | ORAL_CAPSULE | Freq: Two times a day (BID) | ORAL | 0 refills | Status: DC
Start: 2021-05-28 — End: 2021-06-09

## 2021-05-28 NOTE — Discharge Instructions (Signed)
Follow with Primary MD Laurey Morale, MD in 7 days   Get CBC, CMP,  checked  by Primary MD next visit.    Activity: As tolerated with Full fall precautions use walker/cane & assistance as needed   Disposition Home    Diet: Heart Healthy  , with feeding assistance and aspiration precautions.   On your next visit with your primary care physician please Get Medicines reviewed and adjusted.   Please request your Prim.MD to go over all Hospital Tests and Procedure/Radiological results at the follow up, please get all Hospital records sent to your Prim MD by signing hospital release before you go home.   If you experience worsening of your admission symptoms, develop shortness of breath, life threatening emergency, suicidal or homicidal thoughts you must seek medical attention immediately by calling 911 or calling your MD immediately  if symptoms less severe.  You Must read complete instructions/literature along with all the possible adverse reactions/side effects for all the Medicines you take and that have been prescribed to you. Take any new Medicines after you have completely understood and accpet all the possible adverse reactions/side effects.   Do not drive, operating heavy machinery, perform activities at heights, swimming or participation in water activities or provide baby sitting services if your were admitted for syncope or siezures until you have seen by Primary MD or a Neurologist and advised to do so again.  Do not drive when taking Pain medications.    Do not take more than prescribed Pain, Sleep and Anxiety Medications  Special Instructions: If you have smoked or chewed Tobacco  in the last 2 yrs please stop smoking, stop any regular Alcohol  and or any Recreational drug use.  Wear Seat belts while driving.   Please note  You were cared for by a hospitalist during your hospital stay. If you have any questions about your discharge medications or the care you received  while you were in the hospital after you are discharged, you can call the unit and asked to speak with the hospitalist on call if the hospitalist that took care of you is not available. Once you are discharged, your primary care physician will handle any further medical issues. Please note that NO REFILLS for any discharge medications will be authorized once you are discharged, as it is imperative that you return to your primary care physician (or establish a relationship with a primary care physician if you do not have one) for your aftercare needs so that they can reassess your need for medications and monitor your lab values.

## 2021-05-28 NOTE — Progress Notes (Signed)
Physical Therapy Treatment Patient Details Name: Guy Moore MRN: [id number] DOB: 06-Aug-1934 Today's Date: 05/28/2021    History of Present Illness Pt is an 87 y/o male admitted 8/14 following increased bleeding from perianal abscess. Pt had previously presented to ED for abscess on 8/13 and had I and D. Pt also found to have PNA. PMH includes HTN, a jfib, COPD, and DM.    PT Comments    Patient received sitting at EOB, very pleasant but very hyperverbose and tangential- needed frequent redirection. Able to mobilize on a min guard basis during session, but generally weak and tremulous. Highest observed HR 111BPM (from resting rate in 70s-80s), SPO2 low 90s with gait on room air. Would really benefit from skilled OP PT f/u, and he is agreeable to this, really likes [facility] clinic. Discussed use of pulse ox and energy conservation- but he really has a great grasp of both of these concepts. Left sitting at EOB with all needs met, MD aware of updated PT reccs.     Follow Up Recommendations  Outpatient PT;Other (comment) (requesting [facility])     Equipment Recommendations  None recommended by PT    Recommendations for Other Services       Precautions / Restrictions Precautions Precautions: Fall Restrictions Weight Bearing Restrictions: No    Mobility  Bed Mobility               General bed mobility comments: sitting at EOB upon entry    Transfers Overall transfer level: Needs assistance Equipment used: None Transfers: Sit to/from Stand Sit to Stand: Supervision         General transfer comment: S for safety, assist for line management  Ambulation/Gait Ambulation/Gait assistance: Min guard Gait Distance (Feet): 200 Feet Assistive device: None Gait Pattern/deviations: Step-through pattern;Decreased stride length;Trunk flexed Gait velocity: Decreased   General Gait Details: slow and mildly unsteady without assistive device- min guard for  safety and balance. No LOB but weak and mildly tremulous. HR to 111BPM, SpO2 low 90s with activity on room air   Stairs             Wheelchair Mobility    Modified Rankin (Stroke Patients Only)       Balance Overall balance assessment: Mild deficits observed, not formally tested                                          Cognition Arousal/Alertness: Awake/alert Behavior During Therapy: WFL for tasks assessed/performed Overall Cognitive Status: Within Functional Limits for tasks assessed                                 General Comments: very pleasant and motivated but tangential and hyperverbose      Exercises      General Comments        Pertinent Vitals/Pain Pain Assessment: Faces Faces Pain Scale: No hurt Pain Intervention(s): Limited activity within patient's tolerance;Monitored during session    Home Living                      Prior Function            PT Goals (current goals can now be found in the care plan section) Acute Rehab PT Goals Patient Stated Goal: to go home PT  Goal Formulation: With patient Time For Goal Achievement: 06/09/21 Potential to Achieve Goals: Good Progress towards PT goals: Progressing toward goals    Frequency    Min 3X/week      PT Plan Discharge plan needs to be updated    Co-evaluation              AM-PAC PT "6 Clicks" Mobility   Outcome Measure  Help needed turning from your back to your side while in a flat bed without using bedrails?: None Help needed moving from lying on your back to sitting on the side of a flat bed without using bedrails?: None Help needed moving to and from a bed to a chair (including a wheelchair)?: A Little Help needed standing up from a chair using your arms (e.g., wheelchair or bedside chair)?: A Little Help needed to walk in hospital room?: A Little Help needed climbing 3-5 steps with a railing? : A Little 6 Click Score: 20    End  of Session   Activity Tolerance: Patient tolerated treatment well Patient left: in bed;with call bell/phone within reach (sitting on EOB per his request) Nurse Communication: Mobility status PT Visit Diagnosis: Other abnormalities of gait and mobility (R26.89);Muscle weakness (generalized) (M62.81)     Time: 1020-1053 PT Time Calculation (min) (ACUTE ONLY): 33 min  Charges:  $Gait Training: 8-22 mins $Self Care/Home Management: 8-22                    Windell Norfolk, DPT, PN2   Supplemental Physical Therapist [place]    Pager [phone] Acute Rehab Office [phone]

## 2021-05-28 NOTE — Discharge Summary (Signed)
Physician Discharge Summary  Guy Moore [id number] DOB: 1934/07/27 DOA: 05/24/2021  PCP: Laurey Morale, MD  Admit date: 05/24/2021 Discharge date: 05/28/2021  Admitted From:Home Disposition:  Home   Recommendations for Outpatient Follow-up:  Follow up with PCP in 1-2 weeks Please obtain BMP/CBC in one week Patient to follow-up with general surgery as needed  Outpatient PT  Discharge Condition:Stable CODE STATUS:FULL Diet recommendation: Heart Healthy   Brief/Interim Summary:   Guy Moore is a 87 y.o. male with medical history significant for A-fib on xaraleto, COPD/Asthma, HTN who presents by EMS with bleeding from perianal abscess that was I&D'd yesterday in ER.  He reports after he went home he was doing well but then he noticed bleeding from the I&D the area when he went to have a bowel movement.  He states it was a copious mount of bleeding and was unable to initially stop it so EMS was called.  As well he does report some shortness of breath, he has significant wheezing on presentation, admitted for COPD exacerebration, and he was evaluated by general surgery for his incision site.  Discharge Diagnoses:  Principal Problem:   CAP (community acquired pneumonia) Active Problems:   OBSTRUCTIVE SLEEP APNEA   Essential hypertension   Atrial fibrillation (HCC)   Anticoagulant long-term use   COPD with asthma (HCC)   Dyspnea   Abscess of buttock  COPD exacerbation -Patient with known history of COPD as an outpatient, he has been followed by pulmonary as an outpatient Dr. Halford Chessman . -He is with significant wheezing, increased work of breathing on presentation, he was started on IV Solu-Medrol, given his cough, productive, he was treated with doxycycline and Rocephin as well, he was put on scheduled DuoNeb, initially he was slow to improve, but over the last 24 hours he had significant improvement, this morning he is with no wheezing, no respiratory distress, so he will be  discharged home on steroid taper, and he will be given prescription for as needed nebs, and he will be given incentive spirometer and flutter valve to keep using at home.   -Pneumonia has been ruled out, initially suspicion for pneumonia given bibasilar opacities atelectasis versus pneumonia, but CT chest with no evidence of pneumonia.   Abscess of buttock/bleeding at incision site -Patient with a ED visit on 8/12 significant for perineal abscess status post I&D by ED, apparently deep 3.5 cm. -General surgery input greatly appreciated, at this point no indication for wound packing, continue with sitz baths only, -Xarelto has been held during hospital stay, he kept having scant amount of bleeding intermittently, will be discharged on his home dose Xarelto.    Dyspnea Resolved   Essential hypertension Resume home meds on discharge     Atrial fibrillation Resume xarelto on discharge     OBSTRUCTIVE SLEEP APNEA CPAP at night     Anticoagulant long-term use Continue Xarelto      Prolonged QT interval Avoid medications with could further prolong QT interval         Objective:       Vitals:    05/27/21 0850 05/27/21 0852 05/27/21 1136 05/27/21 1148  BP:       (!) 152/80  Pulse:       93  Resp:       19  Temp:       98 F (36.7 C)  TempSrc:       Axillary  SpO2: 93% 93% 93% 93%  Weight:  Height:              Intake/Output Summary (Last 24 hours) at 05/27/2021 1409 Last data filed at 05/27/2021 1338    Gross per 24 hour  Intake 1898.8 ml  Output 1175 ml  Net 723.8 ml        Filed Weights    05/25/21 0358 05/26/21 1808  Weight: 115.8 kg 112.7 kg      Examination:    Discharge Instructions  Discharge Instructions     Diet - low sodium heart healthy   Complete by: As directed    Discharge instructions   Complete by: As directed    Follow with Primary MD Laurey Morale, MD in 7 days   Get CBC, CMP,  checked  by Primary MD next visit.    Activity: As  tolerated with Full fall precautions use walker/cane & assistance as needed   Disposition Home    Diet: Heart Healthy  , with feeding assistance and aspiration precautions.   On your next visit with your primary care physician please Get Medicines reviewed and adjusted.   Please request your Prim.MD to go over all Hospital Tests and Procedure/Radiological results at the follow up, please get all Hospital records sent to your Prim MD by signing hospital release before you go home.   If you experience worsening of your admission symptoms, develop shortness of breath, life threatening emergency, suicidal or homicidal thoughts you must seek medical attention immediately by calling 911 or calling your MD immediately  if symptoms less severe.  You Must read complete instructions/literature along with all the possible adverse reactions/side effects for all the Medicines you take and that have been prescribed to you. Take any new Medicines after you have completely understood and accpet all the possible adverse reactions/side effects.   Do not drive, operating heavy machinery, perform activities at heights, swimming or participation in water activities or provide baby sitting services if your were admitted for syncope or siezures until you have seen by Primary MD or a Neurologist and advised to do so again.  Do not drive when taking Pain medications.    Do not take more than prescribed Pain, Sleep and Anxiety Medications  Special Instructions: If you have smoked or chewed Tobacco  in the last 2 yrs please stop smoking, stop any regular Alcohol  and or any Recreational drug use.  Wear Seat belts while driving.   Please note  You were cared for by a hospitalist during your hospital stay. If you have any questions about your discharge medications or the care you received while you were in the hospital after you are discharged, you can call the unit and asked to speak with the hospitalist on  call if the hospitalist that took care of you is not available. Once you are discharged, your primary care physician will handle any further medical issues. Please note that NO REFILLS for any discharge medications will be authorized once you are discharged, as it is imperative that you return to your primary care physician (or establish a relationship with a primary care physician if you do not have one) for your aftercare needs so that they can reassess your need for medications and monitor your lab values.   Discharge wound care:   Complete by: As directed    Continue with sitz baths 3 times daily   Increase activity slowly   Complete by: As directed       Allergies as of 05/28/2021  No Known Allergies      Medication List     TAKE these medications    acetaminophen 325 MG tablet Commonly known as: TYLENOL Take 2 tablets (650 mg total) by mouth every 6 (six) hours as needed for mild pain (or Fever >/= 101). What changed:  medication strength how much to take reasons to take this   albuterol 108 (90 Base) MCG/ACT inhaler Commonly known as: VENTOLIN HFA USE 2 INHALATIONS EVERY 6 HOURS AS NEEDED FOR WHEEZING OR SHORTNESS OF BREATH What changed: See the new instructions.   amLODipine 5 MG tablet Commonly known as: NORVASC TAKE 1 TABLET DAILY   Besivance 0.6 % Susp Generic drug: Besifloxacin HCl Place 1 drop into both eyes every 30 (thirty) days.   CITRUCEL PO Take 2 tablets by mouth daily after breakfast.   doxycycline 100 MG capsule Commonly known as: VIBRAMYCIN Take 1 capsule (100 mg total) by mouth 2 (two) times daily. PLEASE TAKE FOR 6 DAYS ONLY. What changed: additional instructions   Flovent Diskus 50 MCG/BLIST diskus inhaler Generic drug: fluticasone Inhale 1 puff into the lungs 2 (two) times daily.   guaiFENesin 600 MG 12 hr tablet Commonly known as: MUCINEX Take 1 tablet (600 mg total) by mouth 2 (two) times daily for 7 days.   ipratropium-albuterol  0.5-2.5 (3) MG/3ML Soln Commonly known as: DUONEB Take 3 mLs by nebulization every 6 (six) hours as needed.   MAGNESIUM GLUCONATE PO Take 1 tablet by mouth daily.   omeprazole 40 MG capsule Commonly known as: PRILOSEC TAKE 1 CAPSULE DAILY What changed:  when to take this reasons to take this   potassium chloride SA 20 MEQ tablet Commonly known as: KLOR-CON TAKE 1 TABLET DAILY   pravastatin 40 MG tablet Commonly known as: PRAVACHOL TAKE 1 TABLET AT BEDTIME   predniSONE 10 MG (21) Tbpk tablet Commonly known as: STERAPRED UNI-PAK 21 TAB Take 6-5-4-3-2-1 tablets by mouth daily till gone.   PreserVision AREDS 2 Caps Take 1 capsule by mouth 2 (two) times daily.   valsartan 160 MG tablet Commonly known as: DIOVAN TAKE 1 TABLET DAILY   Xarelto 20 MG Tabs tablet Generic drug: rivaroxaban TAKE 1 TABLET DAILY What changed:  how much to take when to take this               Discharge Care Instructions  (From admission, onward)           Start     Ordered   05/28/21 0000  Discharge wound care:       Comments: Continue with sitz baths 3 times daily   05/28/21 1048            Follow-up Information     Laurey Morale, MD Follow up.   Specialty: Family Medicine Why: For wound re-check Contact information: [place] [address] [phone]         [facility], [place] Follow up.   Specialty: General Surgery Why: As needed Contact information: [place] [address] [phone]                No Known Allergies  Consultations: General surgery   Procedures/Studies: CT CHEST W CONTRAST  Result Date: 05/25/2021 CLINICAL DATA:  Recent perianal abscess drainage.  Asthma. EXAM: CT CHEST WITH CONTRAST TECHNIQUE: Multidetector CT imaging of the chest was performed during intravenous contrast administration. CONTRAST:  18m OMNIPAQUE IOHEXOL 350 MG/ML SOLN COMPARISON:  None. FINDINGS:  Cardiovascular:  Coronary artery calcification and aortic atherosclerotic calcification. Pacer wires in RIGHT heart. Mediastinum/Nodes: No axillary or supraclavicular adenopathy. No mediastinal or hilar adenopathy. No pericardial fluid. Esophagus normal. Lungs/Pleura: There is mild bibasilar ground-glass opacities consistent with atelectasis. Band of atelectasis in the RIGHT middle lobe. No airspace disease. No pneumothorax. Mild centrilobular emphysema the upper lobes. Upper Abdomen: Limited view of the liver, kidneys, pancreas are unremarkable. Normal adrenal glands. Musculoskeletal: Degenerative osteophytosis of the spine. IMPRESSION: 1. No acute cardiopulmonary findings. 2. Mild basilar atelectasis. 3. Coronary artery calcification and Aortic Atherosclerosis (ICD10-I70.0). Electronically Signed   By: Suzy Bouchard M.D.   On: 05/25/2021 07:30   DG Chest Port 1 View  Result Date: 05/25/2021 CLINICAL DATA:  Weakness. EXAM: PORTABLE CHEST 1 VIEW COMPARISON:  November 21, 2018 FINDINGS: There is a dual lead AICD. Mild areas of atelectasis and/or early infiltrate are seen in the bilateral lung bases. The heart size and mediastinal contours are within normal limits. The visualized skeletal structures are unremarkable. IMPRESSION: Mild bibasilar atelectasis and/or early infiltrate. Electronically Signed   By: Virgina Norfolk M.D.   On: 05/25/2021 00:38     Subjective:  Patient reports he is feeling a lot better today, dyspnea significantly improved, still reports some mild cough, nonproductive.  Discharge Exam: Vitals:   05/28/21 0757 05/28/21 0838  BP: 130/70   Pulse: 87 87  Resp: 17 18  Temp: 98.5 F (36.9 C)   SpO2: 92% 95%   Vitals:   05/28/21 0006 05/28/21 0433 05/28/21 0757 05/28/21 0838  BP:  (!) 148/99 130/70   Pulse:  79 87 87  Resp:  '20 17 18  '$ Temp: 97.9 F (36.6 C) 98.3 F (36.8 C) 98.5 F (36.9 C)   TempSrc:  Oral Oral   SpO2:  95% 92% 95%  Weight:      Height:         General: Pt is alert, awake, not in acute distress Cardiovascular: RRR, S1/S2 +, no rubs, no gallops Respiratory: CTA bilaterally, no wheezing, no rhonchi Abdominal: Soft, NT, ND, bowel sounds + Extremities: no edema, no cyanosis    The results of significant diagnostics from this hospitalization (including imaging, microbiology, ancillary and laboratory) are listed below for reference.     Microbiology: Recent Results (from the past 240 hour(s))  Resp Panel by RT-PCR (Flu A&B, Covid) Nasopharyngeal Swab     Status: None   Collection Time: 05/25/21  2:12 AM   Specimen: Nasopharyngeal Swab; Nasopharyngeal(NP) swabs in vial transport medium  Result Value Ref Range Status   SARS Coronavirus 2 by RT PCR NEGATIVE NEGATIVE Final    Comment: (NOTE) SARS-CoV-2 target nucleic acids are NOT DETECTED.  The SARS-CoV-2 RNA is generally detectable in upper respiratory specimens during the acute phase of infection. The lowest concentration of SARS-CoV-2 viral copies this assay can detect is 138 copies/mL. A negative result does not preclude SARS-Cov-2 infection and should not be used as the sole basis for treatment or other patient management decisions. A negative result may occur with  improper specimen collection/handling, submission of specimen other than nasopharyngeal swab, presence of viral mutation(s) within the areas targeted by this assay, and inadequate number of viral copies(<138 copies/mL). A negative result must be combined with clinical observations, patient history, and epidemiological information. The expected result is Negative.  Fact Sheet for Patients:  EntrepreneurPulse.com.au  Fact Sheet for Healthcare Providers:  IncredibleEmployment.be  This test is no t yet approved or cleared by the [place] FDA and  has  been authorized for detection and/or diagnosis of SARS-CoV-2 by FDA under an Emergency Use Authorization (EUA). This  EUA will remain  in effect (meaning this test can be used) for the duration of the COVID-19 declaration under Section 564(b)(1) of the Act, 21 U.S.C.section 360bbb-3(b)(1), unless the authorization is terminated  or revoked sooner.       Influenza A by PCR NEGATIVE NEGATIVE Final   Influenza B by PCR NEGATIVE NEGATIVE Final    Comment: (NOTE) The Xpert Xpress SARS-CoV-2/FLU/RSV plus assay is intended as an aid in the diagnosis of influenza from Nasopharyngeal swab specimens and should not be used as a sole basis for treatment. Nasal washings and aspirates are unacceptable for Xpert Xpress SARS-CoV-2/FLU/RSV testing.  Fact Sheet for Patients: EntrepreneurPulse.com.au  Fact Sheet for Healthcare Providers: IncredibleEmployment.be  This test is not yet approved or cleared by the [place] FDA and has been authorized for detection and/or diagnosis of SARS-CoV-2 by FDA under an Emergency Use Authorization (EUA). This EUA will remain in effect (meaning this test can be used) for the duration of the COVID-19 declaration under Section 564(b)(1) of the Act, 21 U.S.C. section 360bbb-3(b)(1), unless the authorization is terminated or revoked.  Performed at [facility], [place] [address], [place], [place] [address]      Labs: BNP (last 3 results) Recent Labs    05/25/21 0258  BNP 123456*   Basic Metabolic Panel: Recent Labs  Lab 05/24/21 1439 05/25/21 0025 05/26/21 0900 05/27/21 0258  NA 138 139 138 135  K 3.6 3.8 4.1 3.7  CL 101 101 101 98  CO2 '25 26 27 26  '$ GLUCOSE 114* 126* 152* 173*  BUN '11 10 15 19  '$ CREATININE 0.95 0.97 0.93 1.07  CALCIUM 8.9 8.7* 8.8* 8.5*   Liver Function Tests: Recent Labs  Lab 05/24/21 1439  AST 18  ALT 17  ALKPHOS 51  BILITOT 1.0  PROT 6.6  ALBUMIN 3.6   No results for input(s): LIPASE, AMYLASE in the last 168 hours. No results for input(s): AMMONIA in the last 168 hours. CBC: Recent Labs   Lab 05/24/21 1439 05/25/21 0025 05/26/21 0900 05/27/21 0258  WBC 4.7 5.1 5.5 6.8  NEUTROABS 3.8 4.3  --   --   HGB 13.3 12.7* 13.0 11.8*  HCT 41.4 37.7* 39.5 35.3*  MCV 90.2 89.5 88.8 88.3  PLT 164 166 201 186   Cardiac Enzymes: No results for input(s): CKTOTAL, CKMB, CKMBINDEX, TROPONINI in the last 168 hours. BNP: Invalid input(s): POCBNP CBG: No results for input(s): GLUCAP in the last 168 hours. D-Dimer No results for input(s): DDIMER in the last 72 hours. Hgb A1c No results for input(s): HGBA1C in the last 72 hours. Lipid Profile No results for input(s): CHOL, HDL, LDLCALC, TRIG, CHOLHDL, LDLDIRECT in the last 72 hours. Thyroid function studies No results for input(s): TSH, T4TOTAL, T3FREE, THYROIDAB in the last 72 hours.  Invalid input(s): FREET3 Anemia work up No results for input(s): VITAMINB12, FOLATE, FERRITIN, TIBC, IRON, RETICCTPCT in the last 72 hours. Urinalysis    Component Value Date/Time   COLORURINE YELLOW 11/21/2018 1749   APPEARANCEUR CLEAR 11/21/2018 1749   LABSPEC >1.046 (H) 11/21/2018 1749   PHURINE 5.0 11/21/2018 1749   GLUCOSEU NEGATIVE 11/21/2018 1749   HGBUR NEGATIVE 11/21/2018 1749   BILIRUBINUR Negative 07/10/2020 [place] 11/21/2018 1749   PROTEINUR Negative 07/10/2020 1555   PROTEINUR NEGATIVE 11/21/2018 1749   UROBILINOGEN 0.2 07/10/2020 1555   UROBILINOGEN 1.0 12/20/2007 1025  NITRITE Negative 07/10/2020 1555   NITRITE NEGATIVE 11/21/2018 1749   LEUKOCYTESUR Negative 07/10/2020 1555   Sepsis Labs Invalid input(s): PROCALCITONIN,  WBC,  LACTICIDVEN Microbiology Recent Results (from the past 240 hour(s))  Resp Panel by RT-PCR (Flu A&B, Covid) Nasopharyngeal Swab     Status: None   Collection Time: 05/25/21  2:12 AM   Specimen: Nasopharyngeal Swab; Nasopharyngeal(NP) swabs in vial transport medium  Result Value Ref Range Status   SARS Coronavirus 2 by RT PCR NEGATIVE NEGATIVE Final    Comment:  (NOTE) SARS-CoV-2 target nucleic acids are NOT DETECTED.  The SARS-CoV-2 RNA is generally detectable in upper respiratory specimens during the acute phase of infection. The lowest concentration of SARS-CoV-2 viral copies this assay can detect is 138 copies/mL. A negative result does not preclude SARS-Cov-2 infection and should not be used as the sole basis for treatment or other patient management decisions. A negative result may occur with  improper specimen collection/handling, submission of specimen other than nasopharyngeal swab, presence of viral mutation(s) within the areas targeted by this assay, and inadequate number of viral copies(<138 copies/mL). A negative result must be combined with clinical observations, patient history, and epidemiological information. The expected result is Negative.  Fact Sheet for Patients:  EntrepreneurPulse.com.au  Fact Sheet for Healthcare Providers:  IncredibleEmployment.be  This test is no t yet approved or cleared by the [place] FDA and  has been authorized for detection and/or diagnosis of SARS-CoV-2 by FDA under an Emergency Use Authorization (EUA). This EUA will remain  in effect (meaning this test can be used) for the duration of the COVID-19 declaration under Section 564(b)(1) of the Act, 21 U.S.C.section 360bbb-3(b)(1), unless the authorization is terminated  or revoked sooner.       Influenza A by PCR NEGATIVE NEGATIVE Final   Influenza B by PCR NEGATIVE NEGATIVE Final    Comment: (NOTE) The Xpert Xpress SARS-CoV-2/FLU/RSV plus assay is intended as an aid in the diagnosis of influenza from Nasopharyngeal swab specimens and should not be used as a sole basis for treatment. Nasal washings and aspirates are unacceptable for Xpert Xpress SARS-CoV-2/FLU/RSV testing.  Fact Sheet for Patients: EntrepreneurPulse.com.au  Fact Sheet for Healthcare  Providers: IncredibleEmployment.be  This test is not yet approved or cleared by the [place] FDA and has been authorized for detection and/or diagnosis of SARS-CoV-2 by FDA under an Emergency Use Authorization (EUA). This EUA will remain in effect (meaning this test can be used) for the duration of the COVID-19 declaration under Section 564(b)(1) of the Act, 21 U.S.C. section 360bbb-3(b)(1), unless the authorization is terminated or revoked.  Performed at [facility], [place] [address], [place], [place] [address]      Time coordinating discharge: Over 30 minutes  SIGNED:   Phillips Climes, MD  [facility] 05/28/2021, 10:49 AM Pager   If 7PM-7AM, please contact night-coverage [email or web address] Password [email or web address]

## 2021-05-28 NOTE — Progress Notes (Signed)
Patient mildly bleeding from perianal incision site. Bleeding began after patient was up to the bathroom to have a bowel movement. Area cleansed well and ABD taped to site with adequate control of bleeding. Provider on call notified.

## 2021-05-28 NOTE — Care Management Important Message (Signed)
Important Message  Patient Details  Name: Guy Moore MRN: [id number] Date of Birth: [birth year]   Medicare Important Message Given:  Yes  Patient left prior to IM delivery  will mail to home address.    Reza Crymes 05/28/2021, 2:31 PM

## 2021-05-28 NOTE — Plan of Care (Signed)

## 2021-05-28 NOTE — Progress Notes (Signed)
Discharge instructions given & explained to pt & his wife as well instructions on wound care.

## 2021-05-29 ENCOUNTER — Telehealth: Payer: Self-pay

## 2021-05-29 NOTE — Telephone Encounter (Signed)
Transition Care Management Unsuccessful Follow-up Telephone Call  Date of discharge and from where:  05/28/2021 [facility]   Attempts:  2nd Attempt  Reason for unsuccessful TCM follow-up call:  Unable to leave message

## 2021-06-02 ENCOUNTER — Encounter (INDEPENDENT_AMBULATORY_CARE_PROVIDER_SITE_OTHER): Payer: Medicare Other | Admitting: Ophthalmology

## 2021-06-02 ENCOUNTER — Other Ambulatory Visit: Payer: Self-pay

## 2021-06-02 ENCOUNTER — Ambulatory Visit: Payer: Medicare Other | Admitting: Family Medicine

## 2021-06-02 DIAGNOSIS — I10 Essential (primary) hypertension: Secondary | ICD-10-CM | POA: Diagnosis not present

## 2021-06-02 DIAGNOSIS — H353231 Exudative age-related macular degeneration, bilateral, with active choroidal neovascularization: Secondary | ICD-10-CM | POA: Diagnosis not present

## 2021-06-02 DIAGNOSIS — H35033 Hypertensive retinopathy, bilateral: Secondary | ICD-10-CM

## 2021-06-02 DIAGNOSIS — H43813 Vitreous degeneration, bilateral: Secondary | ICD-10-CM

## 2021-06-02 DIAGNOSIS — D3131 Benign neoplasm of right choroid: Secondary | ICD-10-CM | POA: Diagnosis not present

## 2021-06-03 ENCOUNTER — Encounter: Payer: Self-pay | Admitting: Family Medicine

## 2021-06-03 ENCOUNTER — Ambulatory Visit (INDEPENDENT_AMBULATORY_CARE_PROVIDER_SITE_OTHER): Payer: Medicare Other | Admitting: Family Medicine

## 2021-06-03 VITALS — BP 128/84 | HR 84 | Temp 98.2°F | Wt 241.0 lb

## 2021-06-03 DIAGNOSIS — L02215 Cutaneous abscess of perineum: Secondary | ICD-10-CM

## 2021-06-03 DIAGNOSIS — J449 Chronic obstructive pulmonary disease, unspecified: Secondary | ICD-10-CM

## 2021-06-03 NOTE — Progress Notes (Signed)
   Subjective:    Patient ID: Guy Moore, male    DOB: 03-Jan-1934, 87 y.o.   MRN: [id number]  HPI Here to follow up a hospital stay from 05-24-21 to 05-28-21 for an acute exacerbation of COPD and for a perianal abscess. The abscess had been incised and drained in the ER the day before this admission, and the patient reported excessive bleeding from the site. Of course he was taking Xarelto at the time for his atrial fibrillation. The Xarelto was held during the hospital stay, and he resumed talking it on DC. The COPD was treated with IV Solumedrol. He was sent home with 6 days of Doxycycline. Since getting home he has felt fairly well, although he is quite weak. He is using his nebulizer twice daily and taking Sitz baths twice daily. The drainage from the abscess stopped 2 days ago.    Review of Systems  Constitutional:  Positive for fatigue. Negative for fever.  Respiratory:  Positive for shortness of breath and wheezing. Negative for cough.   Cardiovascular: Negative.   Skin:  Positive for wound.      Objective:   Physical Exam Constitutional:      Comments: Somewhat weak   Cardiovascular:     Rate and Rhythm: Normal rate. Rhythm irregular.     Pulses: Normal pulses.     Heart sounds: Normal heart sounds.  Pulmonary:     Effort: Pulmonary effort is normal. No respiratory distress.     Breath sounds: Wheezing present. No rhonchi or rales.  Abdominal:     General: Abdomen is flat. Bowel sounds are normal. There is no distension.     Palpations: Abdomen is soft. There is no mass.     Tenderness: There is no abdominal tenderness. There is no guarding or rebound.     Hernia: No hernia is present.  Skin:    Comments: The site of the abscess in the perineum is clean with no drainage seen   Neurological:     Mental Status: He is alert.          Assessment & Plan:  His COPD exacerbation has resolved and he is back to baseline. He will use his nebulizer and inhaler as before. The  perineal abscess is healing nicely. He will finish out the Doxycycline. Recheck as needed. We spent 35 minutes reviewing records and discussing these issues.  Alysia Penna, MD

## 2021-06-08 ENCOUNTER — Encounter: Payer: Self-pay | Admitting: Family Medicine

## 2021-06-09 ENCOUNTER — Encounter: Payer: Self-pay | Admitting: Family Medicine

## 2021-06-09 ENCOUNTER — Ambulatory Visit (INDEPENDENT_AMBULATORY_CARE_PROVIDER_SITE_OTHER): Payer: Medicare Other | Admitting: Family Medicine

## 2021-06-09 ENCOUNTER — Other Ambulatory Visit: Payer: Self-pay

## 2021-06-09 ENCOUNTER — Ambulatory Visit (INDEPENDENT_AMBULATORY_CARE_PROVIDER_SITE_OTHER): Payer: Medicare Other

## 2021-06-09 VITALS — BP 130/86 | HR 73 | Temp 98.6°F | Wt 239.0 lb

## 2021-06-09 DIAGNOSIS — E538 Deficiency of other specified B group vitamins: Secondary | ICD-10-CM

## 2021-06-09 DIAGNOSIS — I10 Essential (primary) hypertension: Secondary | ICD-10-CM | POA: Diagnosis not present

## 2021-06-09 DIAGNOSIS — L0231 Cutaneous abscess of buttock: Secondary | ICD-10-CM

## 2021-06-09 DIAGNOSIS — J189 Pneumonia, unspecified organism: Secondary | ICD-10-CM | POA: Diagnosis not present

## 2021-06-09 DIAGNOSIS — D649 Anemia, unspecified: Secondary | ICD-10-CM

## 2021-06-09 MED ORDER — CYANOCOBALAMIN 1000 MCG/ML IJ SOLN
1000.0000 ug | Freq: Once | INTRAMUSCULAR | Status: AC
  Administered 2021-06-09: 1000 ug via INTRAMUSCULAR

## 2021-06-09 NOTE — Progress Notes (Signed)
   Subjective:    Patient ID: Guy Moore, male    DOB: [birth year], 87 y.o.   MRN: [id number]  HPI Here with his wife to recheck the site of a perineal abscess and for generalized weakness. He was here this morning for a B12 shot and he had trouble standing for a few minutes. No SOB or chest pain. We asked him to come back this afternoon for an exam. Of note he was recently in the hospital from 05-24-21 to 05-28-21 for a perineal abscess. This was incised and drained in the hospital and he was put on 10 days of Doxycycline. He still has some oozing of red blood which comes and goes. These is no pain. He has no fever. His Hgb had dropped from 13 to 11.8 but his DC home.    Review of Systems  Constitutional:  Positive for fatigue.  Respiratory: Negative.    Cardiovascular: Negative.   Gastrointestinal: Negative.   Genitourinary: Negative.   Neurological:  Positive for weakness.      Objective:   Physical Exam Constitutional:      Comments: He is weak, in a wheelchair   Cardiovascular:     Rate and Rhythm: Normal rate and regular rhythm.     Pulses: Normal pulses.     Heart sounds: Normal heart sounds.  Pulmonary:     Effort: Pulmonary effort is normal.     Breath sounds: Normal breath sounds.  Genitourinary:    Comments: The site of the perineal abscess is clean and draining a scant amount of serosanguinous fluid  Neurological:     Mental Status: He is alert.          Assessment & Plan:  His pneumonia has resolved, and the perineal infection has resolved. He is still weak however, and I suspect he is mildly anemic. We will check a CBC and iron  levels today. We spent 35 minutes reviewing records and discussing these issues.  Alysia Penna, MD

## 2021-06-09 NOTE — Progress Notes (Signed)
Per orders of Laurey Morale, MD, injection of B12 given in   deltoid by Franco Collet. Patient tolerated injection well.  Lab Results  Component Value Date   [id number] 02/24/2021     *Pt became dizzy in the waiting area and 2 pt caught him. Pt stated that he just got out of the hospital with pneumonia 4 days ago. I spoke with Dr.Fry and had pt scheduled for this afternoon.

## 2021-06-10 LAB — IBC + FERRITIN
Ferritin: 101.4 ng/mL (ref 22.0–322.0)
Iron: 63 ug/dL (ref 42–165)
Saturation Ratios: 19.2 % — ABNORMAL LOW (ref 20.0–50.0)
TIBC: 327.6 ug/dL (ref 250.0–450.0)
Transferrin: 234 mg/dL (ref 212.0–360.0)

## 2021-06-10 LAB — IRON: Iron: 63 ug/dL (ref 42–165)

## 2021-06-10 LAB — CBC WITH DIFFERENTIAL/PLATELET
Basophils Absolute: 0 10*3/uL (ref 0.0–0.1)
Basophils Relative: 1.3 % (ref 0.0–3.0)
Eosinophils Absolute: 0.1 10*3/uL (ref 0.0–0.7)
Eosinophils Relative: 2.1 % (ref 0.0–5.0)
HCT: 40.7 % (ref 39.0–52.0)
Hemoglobin: 13.6 g/dL (ref 13.0–17.0)
Lymphocytes Relative: 8.8 % — ABNORMAL LOW (ref 12.0–46.0)
Lymphs Abs: 0.3 10*3/uL — ABNORMAL LOW (ref 0.7–4.0)
MCHC: 33.3 g/dL (ref 30.0–36.0)
MCV: 88.1 fl (ref 78.0–100.0)
Monocytes Absolute: 0.4 10*3/uL (ref 0.1–1.0)
Monocytes Relative: 9.3 % (ref 3.0–12.0)
Neutro Abs: 3 10*3/uL (ref 1.4–7.7)
Neutrophils Relative %: 78.5 % — ABNORMAL HIGH (ref 43.0–77.0)
Platelets: 105 10*3/uL — ABNORMAL LOW (ref 150.0–400.0)
RBC: 4.62 Mil/uL (ref 4.22–5.81)
RDW: 15.6 % — ABNORMAL HIGH (ref 11.5–15.5)
WBC: 3.8 10*3/uL — ABNORMAL LOW (ref 4.0–10.5)

## 2021-06-11 ENCOUNTER — Ambulatory Visit: Payer: Medicare Other | Admitting: Family Medicine

## 2021-06-17 DIAGNOSIS — L905 Scar conditions and fibrosis of skin: Secondary | ICD-10-CM | POA: Diagnosis not present

## 2021-06-17 DIAGNOSIS — C44619 Basal cell carcinoma of skin of left upper limb, including shoulder: Secondary | ICD-10-CM | POA: Diagnosis not present

## 2021-06-18 ENCOUNTER — Other Ambulatory Visit: Payer: Self-pay

## 2021-06-18 ENCOUNTER — Encounter: Payer: Self-pay | Admitting: Family Medicine

## 2021-06-18 ENCOUNTER — Ambulatory Visit (INDEPENDENT_AMBULATORY_CARE_PROVIDER_SITE_OTHER): Payer: Medicare Other | Admitting: Family Medicine

## 2021-06-18 VITALS — BP 118/68 | HR 96 | Temp 98.8°F | Wt 236.0 lb

## 2021-06-18 DIAGNOSIS — R197 Diarrhea, unspecified: Secondary | ICD-10-CM

## 2021-06-18 DIAGNOSIS — J189 Pneumonia, unspecified organism: Secondary | ICD-10-CM | POA: Diagnosis not present

## 2021-06-18 DIAGNOSIS — L24A9 Irritant contact dermatitis due friction or contact with other specified body fluids: Secondary | ICD-10-CM

## 2021-06-18 DIAGNOSIS — J449 Chronic obstructive pulmonary disease, unspecified: Secondary | ICD-10-CM

## 2021-06-18 NOTE — Progress Notes (Signed)
   Subjective:    Patient ID: Guy Moore, male    DOB: 1933-11-30, 87 y.o.   MRN: [id number]  HPI Here for 2 issues. First of all he asks about diarrhea. This began one week ago, just after his last visit here. Of note he recently was treated with several antibiotics, the last one being Doxycycline, that were used to treat pneumonia and a perineal abscess. The stools were watery for the first 5 days and he was passing a lot of gas. No abdominal pain or nausea or fever. Then the stools became more formed, and his last one this morning was formed but soft. No blood was seen. He has been following a BRAT diet. Also he saw Dr. Pearline Cables yesterday for a lesion on the left forearm. This was excised and dressed with a Bandaid. The pathology report is still pending. Overnight the wound seeped some blood, though today it appears to have stopped.    Review of Systems  Constitutional: Negative.   Respiratory: Negative.    Cardiovascular: Negative.   Gastrointestinal:  Positive for diarrhea. Negative for abdominal distention, abdominal pain, anal bleeding, blood in stool, constipation, nausea, rectal pain and vomiting.  Skin:  Positive for wound.      Objective:   Physical Exam Constitutional:      Appearance: Normal appearance.  Cardiovascular:     Rate and Rhythm: Normal rate and regular rhythm.     Pulses: Normal pulses.     Heart sounds: Normal heart sounds.  Pulmonary:     Effort: Pulmonary effort is normal.     Breath sounds: Normal breath sounds.  Abdominal:     General: Abdomen is flat. Bowel sounds are normal. There is no distension.     Palpations: Abdomen is soft. There is no mass.     Tenderness: There is no abdominal tenderness. There is no guarding or rebound.     Hernia: No hernia is present.  Skin:    Comments: The left forearm has a linear wound measuring 1 cm. 3 sutures are in place. There is a slight bloody drainage from one end of the wound. No signs of infection.   Neurological:     Mental Status: He is alert.          Assessment & Plan:  He had a bout of diarrhea which was likely an effect from the antibiotics he has been taking. He will advance his diet as tolerated. This should resolve quickly, but if not he will let us know. He may add a probiotic such as Electronics engineer. Also he has some bleeding from the site of a skin surgery. We applied a pressure dressing with gauze today, and he will leave this in place for 2 more days. He is due to follow up with Dr. Pearline Cables in 2 weeks. We spent 35 minutes reviewing records and discussing these issues.   Alysia Penna, MD

## 2021-06-20 ENCOUNTER — Encounter: Payer: Self-pay | Admitting: Pulmonary Disease

## 2021-06-20 ENCOUNTER — Other Ambulatory Visit: Payer: Self-pay

## 2021-06-20 ENCOUNTER — Ambulatory Visit (INDEPENDENT_AMBULATORY_CARE_PROVIDER_SITE_OTHER): Payer: Medicare Other | Admitting: Pulmonary Disease

## 2021-06-20 VITALS — BP 122/72 | HR 58 | Temp 97.3°F | Ht 70.0 in | Wt 236.4 lb

## 2021-06-20 DIAGNOSIS — G4730 Sleep apnea, unspecified: Secondary | ICD-10-CM | POA: Diagnosis not present

## 2021-06-20 DIAGNOSIS — E669 Obesity, unspecified: Secondary | ICD-10-CM | POA: Diagnosis not present

## 2021-06-20 DIAGNOSIS — J449 Chronic obstructive pulmonary disease, unspecified: Secondary | ICD-10-CM

## 2021-06-20 DIAGNOSIS — Z9989 Dependence on other enabling machines and devices: Secondary | ICD-10-CM | POA: Diagnosis not present

## 2021-06-20 DIAGNOSIS — J432 Centrilobular emphysema: Secondary | ICD-10-CM

## 2021-06-20 DIAGNOSIS — G4733 Obstructive sleep apnea (adult) (pediatric): Secondary | ICD-10-CM | POA: Diagnosis not present

## 2021-06-20 NOTE — Progress Notes (Signed)
[facility]  Chief Complaint  Patient presents with   Follow-up    Had perrineal abscess recently    Constitutional:  BP 122/72 (BP Location: Left Arm, Cuff Size: Normal)   Pulse (!) 58   Temp (!) 97.3 F (36.3 C) (Oral)   Ht '5\' 10"'$  (1.778 m)   Wt 236 lb 6.4 oz (107.2 kg)   SpO2 95%   BMI 33.92 kg/m   Past Medical History:  DM, HTN, HLD, HH, Gastric ulcer, Esophageal stricture, GERD, Diverticulosis, Depression, A fib/flutter, OA, Allergies  Past Surgical History:  He  has a past surgical history that includes Uvulopalatopharyngoplasty (1980's); Vasectomy (1970's); Cardiac electrophysiology study and ablation; and Nose surgery.  Brief Summary:  Guy Moore is a 87 y.o. 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      Subjective:   He was in hospital recently for perineal abscess with bleeding and pneumonia.  He had trouble with his breathing and cough during this.  He was also severely deconditioned.   Breathing and stamina improving.  He is now able to walk up stairs without needing to rest.  He only has coughing spells now when he uses incentive spirometer or flutter valve and has to take a deep breath.  He is not having wheeze, or chest congestion.  Uses CPAP nightly.    Physical Exam:   Appearance - well kempt   ENMT - no sinus tenderness, no oral exudate, no LAN, Mallampati 3 airway, no stridor  Respiratory - equal breath sounds bilaterally, no wheezing or rales  CV - s1s2 regular rate and rhythm, no murmurs  Ext - no clubbing, no edema  Skin - no rashes  Psych - normal mood and affect   Pulmonary testing:  FeNO 08/04/16 >> 50 PFT 12/01/16 >> FEV1 1.93 (68%), FEV1% 61, TLC 6.59 (93%), DLCO 58%, +BD  Chest Imaging:  CT angio chest 12/23/07 >> mild emphysema, scarring RML CT angio chest 11/21/18 >> mild centrilobular emphysema, mild mosaic perfusion CT chest 05/25/21  >> mild basilar ATX, mild centrilobular emphysema  Sleep Tests:  PSG 07/24/09 >> AHI 58, SpO2 low 74% Auto CPAP 03/22/21 to 06/19/21 >> used on 87 of 90 nights with average 9 hrs 51 min.  Average AHI 1.6 with median CPAP 8 and 95 th percentile CPAP 12 cm H2O  Social History:  He  reports that he quit smoking about 43 years ago. His smoking use included cigarettes. He has a 28.00 pack-year smoking history. He has never used smokeless tobacco. He reports that he does not drink alcohol and does not use drugs.  Family History:  His family history includes Atrial fibrillation in his brother; Breast cancer in his brother and mother; Cancer in his father; Heart disease in his father; Hypertension in his father; Stroke in his brother and mother; Tremor in his brother.     Assessment/Plan:   COPD with asthma and emphysema. - I don't think his recent respiratory issues were related to worsening asthma - continue flovent and prn duoneb or ventolin - can stop incentive spirometry and flutter valve use   Obstructive sleep apnea. - he is compliant with CPAP and reports benefit from therapy - uses [employer] for his DME - his machine is more than 85 years old - will arrange for new auto CPAP with pressure range 5 to 15 cm H2O  Time Spent Involved in Patient Care on Day of Examination:  31 minutes  Follow up:   Patient Instructions  Okay to stop using incentive spirometer and flutter valve  Will have [employer] arrange for a new auto CPAP machine  Follow up in 4 to 5 months  Medication List:   Allergies as of 06/20/2021   No Known Allergies      Medication List        Accurate as of June 20, 2021  2:43 PM. If you have any questions, ask your nurse or doctor.          acetaminophen 325 MG tablet Commonly known as: TYLENOL Take 2 tablets (650 mg total) by mouth every 6 (six) hours as needed for mild pain (or Fever >/= 101).   albuterol 108 (90 Base) MCG/ACT inhaler Commonly known as:  VENTOLIN HFA USE 2 INHALATIONS EVERY 6 HOURS AS NEEDED FOR WHEEZING OR SHORTNESS OF BREATH What changed: See the new instructions.   amLODipine 5 MG tablet Commonly known as: NORVASC TAKE 1 TABLET DAILY   Besivance 0.6 % Susp Generic drug: Besifloxacin HCl Place 1 drop into both eyes every 30 (thirty) days.   CITRUCEL PO Take 2 tablets by mouth daily after breakfast.   Flovent Diskus 50 MCG/BLIST diskus inhaler Generic drug: fluticasone Inhale 1 puff into the lungs 2 (two) times daily.   ipratropium-albuterol 0.5-2.5 (3) MG/3ML Soln Commonly known as: DUONEB Take 3 mLs by nebulization every 6 (six) hours as needed.   MAGNESIUM GLUCONATE PO Take 1 tablet by mouth daily.   omeprazole 40 MG capsule Commonly known as: PRILOSEC TAKE 1 CAPSULE DAILY What changed:  when to take this reasons to take this   potassium chloride SA 20 MEQ tablet Commonly known as: KLOR-CON TAKE 1 TABLET DAILY   pravastatin 40 MG tablet Commonly known as: PRAVACHOL TAKE 1 TABLET AT BEDTIME   PreserVision AREDS 2 Caps Take 1 capsule by mouth 2 (two) times daily.   valsartan 160 MG tablet Commonly known as: DIOVAN TAKE 1 TABLET DAILY   Xarelto 20 MG Tabs tablet Generic drug: rivaroxaban TAKE 1 TABLET DAILY What changed:  how much to take when to take this        Signature:  [name], MD [place] Pager - [phone] 06/20/2021, 2:43 PM

## 2021-06-20 NOTE — Patient Instructions (Signed)
Okay to stop using incentive spirometer and flutter valve  Will have [employer] arrange for a new auto CPAP machine  Follow up in 4 to 5 months

## 2021-06-23 ENCOUNTER — Encounter: Payer: Self-pay | Admitting: Family Medicine

## 2021-06-23 NOTE — Telephone Encounter (Signed)
Try Miralax but only take 1/2 dose every day

## 2021-06-26 DIAGNOSIS — H353231 Exudative age-related macular degeneration, bilateral, with active choroidal neovascularization: Secondary | ICD-10-CM | POA: Diagnosis not present

## 2021-06-26 DIAGNOSIS — Z79899 Other long term (current) drug therapy: Secondary | ICD-10-CM | POA: Diagnosis not present

## 2021-06-26 DIAGNOSIS — H35033 Hypertensive retinopathy, bilateral: Secondary | ICD-10-CM | POA: Diagnosis not present

## 2021-06-26 DIAGNOSIS — H318 Other specified disorders of choroid: Secondary | ICD-10-CM | POA: Diagnosis not present

## 2021-06-26 DIAGNOSIS — H04123 Dry eye syndrome of bilateral lacrimal glands: Secondary | ICD-10-CM | POA: Diagnosis not present

## 2021-06-26 DIAGNOSIS — Z961 Presence of intraocular lens: Secondary | ICD-10-CM | POA: Diagnosis not present

## 2021-06-26 DIAGNOSIS — I10 Essential (primary) hypertension: Secondary | ICD-10-CM | POA: Diagnosis not present

## 2021-06-26 DIAGNOSIS — D3131 Benign neoplasm of right choroid: Secondary | ICD-10-CM | POA: Diagnosis not present

## 2021-06-27 ENCOUNTER — Ambulatory Visit: Payer: Medicare Other | Admitting: Family Medicine

## 2021-06-30 ENCOUNTER — Other Ambulatory Visit: Payer: Self-pay

## 2021-06-30 ENCOUNTER — Encounter (INDEPENDENT_AMBULATORY_CARE_PROVIDER_SITE_OTHER): Payer: Medicare Other | Admitting: Ophthalmology

## 2021-06-30 DIAGNOSIS — H35033 Hypertensive retinopathy, bilateral: Secondary | ICD-10-CM

## 2021-06-30 DIAGNOSIS — D3131 Benign neoplasm of right choroid: Secondary | ICD-10-CM | POA: Diagnosis not present

## 2021-06-30 DIAGNOSIS — H43813 Vitreous degeneration, bilateral: Secondary | ICD-10-CM

## 2021-06-30 DIAGNOSIS — H353231 Exudative age-related macular degeneration, bilateral, with active choroidal neovascularization: Secondary | ICD-10-CM | POA: Diagnosis not present

## 2021-06-30 DIAGNOSIS — I10 Essential (primary) hypertension: Secondary | ICD-10-CM

## 2021-07-01 ENCOUNTER — Ambulatory Visit: Payer: Medicare Other | Admitting: Pulmonary Disease

## 2021-07-03 ENCOUNTER — Encounter: Payer: Self-pay | Admitting: Family Medicine

## 2021-07-04 NOTE — Telephone Encounter (Signed)
I recommend Align (OTC)

## 2021-07-07 ENCOUNTER — Other Ambulatory Visit: Payer: Self-pay

## 2021-07-07 ENCOUNTER — Ambulatory Visit (INDEPENDENT_AMBULATORY_CARE_PROVIDER_SITE_OTHER): Payer: Medicare Other | Admitting: Family Medicine

## 2021-07-07 ENCOUNTER — Encounter: Payer: Self-pay | Admitting: Family Medicine

## 2021-07-07 VITALS — BP 128/80 | HR 92 | Temp 98.6°F | Wt 229.0 lb

## 2021-07-07 DIAGNOSIS — G4700 Insomnia, unspecified: Secondary | ICD-10-CM | POA: Diagnosis not present

## 2021-07-07 DIAGNOSIS — R197 Diarrhea, unspecified: Secondary | ICD-10-CM | POA: Diagnosis not present

## 2021-07-07 DIAGNOSIS — E538 Deficiency of other specified B group vitamins: Secondary | ICD-10-CM | POA: Diagnosis not present

## 2021-07-07 MED ORDER — TRAZODONE HCL 50 MG PO TABS: 50.0000 mg | ORAL_TABLET | Freq: Every day | ORAL | 2 refills | Status: DC

## 2021-07-07 MED ORDER — CYANOCOBALAMIN 1000 MCG/ML IJ SOLN
1000.0000 ug | Freq: Once | INTRAMUSCULAR | Status: AC
  Administered 2021-07-07: 1000 ug via INTRAMUSCULAR

## 2021-07-07 NOTE — Addendum Note (Signed)
Addended by: Wyvonne Lenz on: 07/07/2021 03:54 PM   Modules accepted: Orders

## 2021-07-07 NOTE — Progress Notes (Signed)
   Subjective:    Patient ID: Guy Moore, male    DOB: 18-Feb-1934, 87 y.o.   MRN: [id number]  HPI Here to follow up on diarrhea. This started about 6 weeks ago. It comes and goes. As long as he sticks with a bland diet (rice, mashed potatoes, broth, etc) he does well, but as soon as he tries to eat anything more complicated he has diarrhea. No pain or nausea or fever. He is becoming fatigued due to lack of nutrition. He also asks for help with sleeping. He tosses and turns and cannot sleep.    Review of Systems  Constitutional: Negative.   Respiratory: Negative.    Cardiovascular: Negative.   Gastrointestinal:  Positive for diarrhea. Negative for abdominal distention, abdominal pain, anal bleeding, blood in stool, constipation, nausea, rectal pain and vomiting.  Psychiatric/Behavioral:  Positive for sleep disturbance. Negative for dysphoric mood. The patient is not nervous/anxious.       Objective:   Physical Exam Constitutional:      Appearance: Normal appearance. He is not ill-appearing.  Cardiovascular:     Rate and Rhythm: Normal rate and regular rhythm.     Pulses: Normal pulses.     Heart sounds: Normal heart sounds.  Pulmonary:     Effort: Pulmonary effort is normal.     Breath sounds: Normal breath sounds.  Abdominal:     General: Abdomen is flat. Bowel sounds are normal. There is no distension.     Palpations: Abdomen is soft. There is no mass.     Tenderness: There is no abdominal tenderness. There is no guarding or rebound.     Hernia: No hernia is present.  Neurological:     General: No focal deficit present.     Mental Status: He is alert and oriented to person, place, and time.          Assessment & Plan:  Diarrhea. He will try taking one Imodium tablet BID. Refer to GI fir further evaluation. For insomnia he will try Trazodone 50 mg a t bedtime. We spent a total of ( 31  ) minutes reviewing records and discussing these issues.  Alysia Penna, MD

## 2021-07-10 ENCOUNTER — Ambulatory Visit (INDEPENDENT_AMBULATORY_CARE_PROVIDER_SITE_OTHER): Payer: Medicare Other | Admitting: Gastroenterology

## 2021-07-10 ENCOUNTER — Other Ambulatory Visit: Payer: Self-pay

## 2021-07-10 ENCOUNTER — Encounter: Payer: Self-pay | Admitting: Gastroenterology

## 2021-07-10 ENCOUNTER — Other Ambulatory Visit (INDEPENDENT_AMBULATORY_CARE_PROVIDER_SITE_OTHER): Payer: Medicare Other

## 2021-07-10 VITALS — BP 140/80 | HR 110 | Ht 68.0 in | Wt 227.4 lb

## 2021-07-10 DIAGNOSIS — Z7901 Long term (current) use of anticoagulants: Secondary | ICD-10-CM

## 2021-07-10 DIAGNOSIS — I480 Paroxysmal atrial fibrillation: Secondary | ICD-10-CM

## 2021-07-10 DIAGNOSIS — K529 Noninfective gastroenteritis and colitis, unspecified: Secondary | ICD-10-CM | POA: Diagnosis not present

## 2021-07-10 LAB — BASIC METABOLIC PANEL
BUN: 10 mg/dL (ref 6–23)
CO2: 30 mEq/L (ref 19–32)
Calcium: 8.7 mg/dL (ref 8.4–10.5)
Chloride: 94 mEq/L — ABNORMAL LOW (ref 96–112)
Creatinine, Ser: 0.86 mg/dL (ref 0.40–1.50)
GFR: 78.89 mL/min (ref >60.00)
Glucose, Bld: 156 mg/dL — ABNORMAL HIGH (ref 70–99)
Potassium: 3.5 mEq/L (ref 3.5–5.1)
Sodium: 135 mEq/L (ref 135–145)

## 2021-07-10 LAB — CBC WITH DIFFERENTIAL/PLATELET
Basophils Absolute: 0.1 10*3/uL (ref 0.0–0.1)
Basophils Relative: 1.3 % (ref 0.0–3.0)
Eosinophils Absolute: 0.1 10*3/uL (ref 0.0–0.7)
Eosinophils Relative: 3.2 % (ref 0.0–5.0)
HCT: 38.4 % — ABNORMAL LOW (ref 39.0–52.0)
Hemoglobin: 13 g/dL (ref 13.0–17.0)
Lymphocytes Relative: 5.4 % — ABNORMAL LOW (ref 12.0–46.0)
Lymphs Abs: 0.2 10*3/uL — ABNORMAL LOW (ref 0.7–4.0)
MCHC: 33.8 g/dL (ref 30.0–36.0)
MCV: 84.9 fl (ref 78.0–100.0)
Monocytes Absolute: 0.3 10*3/uL (ref 0.1–1.0)
Monocytes Relative: 8 % (ref 3.0–12.0)
Neutro Abs: 3.2 10*3/uL (ref 1.4–7.7)
Neutrophils Relative %: 82.1 % — ABNORMAL HIGH (ref 43.0–77.0)
Platelets: 271 10*3/uL (ref 150.0–400.0)
RBC: 4.52 Mil/uL (ref 4.22–5.81)
RDW: 15.8 % — ABNORMAL HIGH (ref 11.5–15.5)
WBC: 3.9 10*3/uL — ABNORMAL LOW (ref 4.0–10.5)

## 2021-07-10 NOTE — Patient Instructions (Signed)
If you are age 85 or older, your body mass index should be between 23-30. Your Body mass index is 34.57 kg/m. If this is out of the aforementioned range listed, please consider follow up with your Primary Care Provider.  If you are age 27 or younger, your body mass index should be between 19-25. Your Body mass index is 34.57 kg/m. If this is out of the aformentioned range listed, please consider follow up with your Primary Care Provider.   __________________________________________________________  The [facility] providers would like to encourage you to use [facility] to communicate with providers for non-urgent requests or questions.  Due to long hold times on the telephone, sending your provider a message by [facility] may be a faster and more efficient way to get a response.  Please allow 48 business hours for a response.  Please remember that this is for non-urgent requests.   Your provider has requested that you go to the basement level for lab work before leaving today. Press "B" on the elevator. The lab is located at the first door on the left as you exit the elevator.  Due to recent changes in healthcare laws, you may see the results of your imaging and laboratory studies on MyChart before your provider has had a chance to review them.  We understand that in some cases there may be results that are confusing or concerning to you. Not all laboratory results come back in the same time frame and the provider may be waiting for multiple results in order to interpret others.  Please give [place] 48 hours in order for your provider to thoroughly review all the results before contacting the office for clarification of your results.   It was a pleasure to see you today!  Thank you for trusting me with your gastrointestinal care!

## 2021-07-10 NOTE — Progress Notes (Addendum)
[facility] Consult Note:  History: Guy Moore 07/10/2021  Referring provider: Laurey Morale, MD  Reason for consult/chief complaint: Diarrhea (Started after abscess, antibiotics and hospital stay) and Weight Loss (20 lbs in 1 month without trying, haven't really been eating much)   Subjective  HPI:  Guy Moore was referred by primary care for diarrhea going on for about the last 4 to 6 weeks.  Guy Moore had a perineal abscess treated with antibiotics and debridement followed by a hospitalization in mid August for pneumonia treated with antibiotics.  At a primary care visit September 7, he was complaining of diarrhea that was felt likely to been related to the antibiotics and expected to resolve. He was seen again by primary care September 26 for this diarrhea, advised to take Imodium and referred to [place].  (No stool studies done) Guy Moore says diarrhea started shortly after the hospital stay, he was having 4-6 loose nonbloody BMs per day including nocturnal diarrhea affecting his sleep.  He denies abdominal pain, but it affected his appetite and then after seeing primary care he went down to a bland diet eating mostly applesauce yogurt and soft foods.  With that he has lost about 20 pounds in the last month.  He denies dysphagia, odynophagia, nausea or vomiting.  He did not start the Imodium because the diarrhea stopped 2 to 3 days ago.  Screening colonoscopy with Dr. Sharlett Iles July 2013, left-sided diverticulosis and no polyps.  ROS:  Review of Systems  Constitutional:  Positive for fatigue. Negative for appetite change and unexpected weight change.  HENT:  Negative for mouth sores and voice change.   Eyes:  Negative for pain and redness.  Respiratory:  Negative for cough and shortness of breath.   Cardiovascular:  Negative for chest pain and palpitations.  Genitourinary:  Negative for dysuria and hematuria.  Musculoskeletal:  Positive for arthralgias. Negative for  myalgias.  Skin:  Negative for pallor and rash.  Neurological:  Negative for weakness and headaches.  Hematological:  Negative for adenopathy.    Past Medical History: Past Medical History:  Diagnosis Date   Allergic rhinitis    Arthritis    Asthma    Atrial fibrillation [facility])    sees Dr. [name] at [facility]    Atrial flutter ([place])    Braden (basal cell carcinoma of skin)    Nose   COPD with emphysema ([place])    sees Dr. [name]    Depression    Diabetes mellitus [facility])    Diet control    Diverticulosis    Erosive esophagitis    Esophageal stricture    Gastric ulcer    Hearing loss    uses amplification   Hemorrhoids    Hiatal hernia    HTN (hypertension)    Hyperlipidemia    Knee problem    2% permanent partial impairment Right   Macular degeneration    Perineal abscess    Pneumonia    Sleep apnea    From last cardiology office note in [facility] June 2020: "PAST SURGICAL HISTORY:  Past Surgical History:  Procedure Laterality Date   3-D MAPPING ADD ON 06/28/2015  Procedure: 3-D Mapping Add On; Surgeon: Dondra Prader, MD; Location: [facility] INVASIVE CARDIOLOGY; Service: Electrophysiology;;   ABLATION OF DYSRHYTHMIC FOCUS   ABLATION SECONDARY TO PRIMARY - ADD ON 06/28/2015  Procedure: Ablation Secondary to Primary NOT AFIB - Add On; Surgeon: Dondra Prader, MD; Location: [facility] INVASIVE CARDIOLOGY; Service: Electrophysiology;;  CARDIOVERSION   EP A-FIB ABLATION N/A 06/28/2015  Procedure: EP Ablation for Atrial Fibrillation; Surgeon: Dondra Prader, MD; Location: [facility] INVASIVE CARDIOLOGY; Service: Electrophysiology; Laterality: N/A;   NEW DUAL CHAMBER PPM W/WOUT LV LEAD (ADD [id number] FOR LV LEAD) Left 11/04/2016  Procedure: INSERTION OF NEW OR REPLACEMENT PERMANENT PACEMAKER WITH TRANSVENOUS ELECTRODES - ATRIAL OR VENTRICULAR DUAL CHAMBER; Surgeon: Dondra Prader, MD; Location: [facility] INVASIVE CARDIOLOGY; Service:  Electrophysiology; Laterality: Left; Medtronic Dual Chamber Pacemaker "  Guy Moore and his wife say he has a Biochemist, clinical in the [place] area, they were going to give contact information to our staff  Past Surgical History: Past Surgical History:  Procedure Laterality Date   CARDIAC ELECTROPHYSIOLOGY STUDY AND ABLATION     x 4 (sees Dr. [name] at [facility])   [place]  06/13/1979   VASECTOMY  81/[id number]/8185   w/complications     Family History: Family History  Problem Relation Age of Onset   Stroke Mother    Breast cancer Mother    Heart attack Mother    Other Mother        brain tunor   Heart disease Father    Hypertension Father    Bladder Cancer Father    Parkinson's disease Father    Stroke Father    Breast cancer Brother    Atrial fibrillation Brother    Tremor Brother    Stroke Brother    Hypertension Brother    Breast cancer Maternal Grandmother    Heart disease Maternal Grandmother    Heart disease Maternal Grandfather    Arthritis Daughter    Heart disease Daughter    Colitis Daughter        Lymphocytic   Colon cancer Neg Hx     Social History: Social History   Socioeconomic History   Marital status: Married    Spouse name: Not on file   Number of children: 2   Years of education: Not on file   Highest education level: Not on file  Occupational History   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 He does not want heroic or futile care especially if he cannon communicate, read and enjoy his loved ones.     Social Determinants of Health   Financial Resource Strain: Low Risk    Difficulty of Paying Living Expenses: Not hard at all  Food Insecurity: No Food Insecurity   Worried About Charity fundraiser in the Last Year: Never true   [place] in the Last Year: Never true  Transportation Needs: No Transportation Needs   Lack of Transportation (Medical): No   Lack of Transportation (Non-Medical):  No  Physical Activity: Inactive   Days of Exercise per Week: 0 days   Minutes of Exercise per Session: 0 min  Stress: No Stress Concern Present   Feeling of Stress : Not at all  Social Connections: Moderately Integrated   Frequency of Communication with Friends and Family: More than three times a week   Frequency of Social Gatherings with Friends and Family: Once a week   Attends Religious Services: 1 to 4 times per year   Active Member of [employer] or Organizations: No   Attends Music therapist: Never   Marital Status: Married    Allergies: No Known Allergies  Outpatient Meds: Current Outpatient Medications  Medication Sig Dispense Refill   acetaminophen (TYLENOL) 325 MG tablet Take 2 tablets (650 mg total) by mouth every 6 (six) hours as needed for mild pain (or Fever >/= 101).     albuterol (VENTOLIN HFA) 108 (90 Base) MCG/ACT inhaler USE 2 INHALATIONS EVERY 6 HOURS AS NEEDED FOR WHEEZING OR SHORTNESS OF BREATH (Patient taking differently: Inhale 2 puffs into the lungs every 6 (six) hours as needed for wheezing or shortness of breath.) 54 g 3   amLODipine (NORVASC) 5 MG tablet TAKE 1 TABLET DAILY (Patient taking differently: Take 5 mg by mouth daily.) 90 tablet 3   BESIVANCE 0.6 % SUSP Place 1 drop into both eyes  every 30 (thirty) days.     fluticasone (FLOVENT DISKUS) 50 MCG/BLIST diskus inhaler Inhale 1 puff into the lungs 2 (two) times daily. 1 each 0   hypromellose (GENTEAL) 0.3 % GEL ophthalmic ointment as needed.     ipratropium-albuterol (DUONEB) 0.5-2.5 (3) MG/3ML SOLN Take 3 mLs by nebulization every 6 (six) hours as needed. 360 mL 1   MAGNESIUM GLUCONATE PO Take 1 tablet by mouth daily.     Methylcellulose, Laxative, (CITRUCEL PO) Take 2 tablets by mouth daily after breakfast.     Multiple Vitamins-Minerals (PRESERVISION AREDS 2) CAPS Take 1 capsule by mouth 2 (two) times daily.     omeprazole (PRILOSEC) 40 MG capsule TAKE 1 CAPSULE DAILY (Patient taking differently: Take 40 mg by mouth daily as needed (indigestion).) 90 capsule 3   potassium chloride SA (KLOR-CON) 20 MEQ tablet TAKE 1 TABLET DAILY (Patient taking differently: Take 20 mEq by mouth daily.) 90 tablet 3   pravastatin (PRAVACHOL) 40 MG tablet TAKE 1 TABLET AT BEDTIME (Patient taking differently: Take 40 mg by mouth at bedtime.) 90 tablet 3   valsartan (DIOVAN) 160 MG tablet TAKE 1 TABLET DAILY (Patient taking differently: Take 160 mg by mouth daily.) 90 tablet 3   XARELTO 20 MG TABS tablet TAKE 1 TABLET DAILY (Patient taking differently: Take 20 mg by mouth at bedtime.) 90 tablet 0   traZODone (DESYREL) 50 MG tablet Take 1 tablet (50 mg total) by mouth at bedtime. (Patient not taking: Reported on 07/10/2021) 30 tablet 2   No current facility-administered medications for this visit.      ___________________________________________________________________ Objective   Exam:  BP 140/80 (BP Location: Left Arm, Patient Position: Sitting, Cuff Size: Normal)   Pulse (!) 110   Ht $R'5\' 8"'tE$  (1.727 m) Comment: height measured without shoes  Wt 227 lb 6 oz (103.1 kg)   BMI 34.57 kg/m  Wt Readings from Last 3 Encounters:  07/10/21 227 lb 6 oz (103.1 kg)  07/07/21 229 lb (103.9 kg)  06/20/21 236 lb 6.4 oz (107.2 kg)   Note that weight  is down 9 pounds in the last  few weeks. Pulse was about 110 after going from chair to the exam table, then decreased to the 90s after sitting for few minutes. General: Ambulatory, steadies himself when standing and can get on the exam table with assistance. Eyes: sclera anicteric, no redness ENT: oral mucosa moist without lesions, no cervical or supraclavicular lymphadenopathy CV: RRR without murmur, S1/S2, no JVD, no peripheral edema.  Pacemaker left upper chest wall Resp: clear to auscultation bilaterally, normal RR and effort noted GI: soft, no tenderness, with active bowel sounds. No guarding or palpable organomegaly noted. Skin; warm and dry, no rash or jaundice noted Neuro: awake, alert and oriented x 3. Normal gross motor function and fluent speech  Labs:  CBC Latest Ref Rng & Units 06/09/2021 05/27/2021 05/26/2021  WBC 4.0 - 10.5 K/uL 3.8(L) 6.8 5.5  Hemoglobin 13.0 - 17.0 g/dL 13.6 11.8(L) 13.0  Hematocrit 39.0 - 52.0 % 40.7 35.3(L) 39.5  Platelets 150.0 - 400.0 K/uL 105.0(L) 186 201   CMP Latest Ref Rng & Units 05/27/2021 05/26/2021 05/25/2021  Glucose 70 - 99 mg/dL 173(H) 152(H) 126(H)  BUN 8 - 23 mg/dL _0 Creatinine 0.61 - 1.24 mg/dL 1.07 0.93 0.97  Sodium 135 - 145 mmol/L 135 138 139  Potassium 3.5 - 5.1 mmol/L 3.7 4.1 3.8  Chloride 98 - 111 mmol/L 98 101 101  CO2 22 - 32 mmol/L _1 Calcium 8.9 - 10.3 mg/dL 8.5(L) 8.8(L) 8.7(L)  Total Protein 6.5 - 8.1 g/dL - - -  Total Bilirubin 0.3 - 1.2 mg/dL - - -  Alkaline Phos 38 - 126 U/L - - -  AST 15 - 41 U/L - - -  ALT 0 - 44 U/L - - -    Assessment: Encounter Diagnoses  Name Primary?   Chronic diarrhea Yes   Anticoagulant long-term use    Paroxysmal atrial fibrillation (HCC)     Guy Moore developed diarrhea after multiple rounds of antibiotics, worrisome for C. difficile.  Curiously, no diarrhea the last 2 to 3 days. He has also lost weight because he significantly restricted his food intake when he was told to  go on a bland diet.  This has made him fatigued and generally weak.  He no longer needs to restrict his diet, I told him he can eat what ever he wants to get some weight back on and to stay hydrated.  He does not appear volume depleted today by exam.  Plan: C diff PCR, GDH antigen, A/B toxin if diarrhea recurs, which I suspect it will. Ova/parasites CBC and BMP.  His hematologic abnormalities were likely the result of acute infection, but we should see if they have normalized.  Also want to check renal function and electrolytes with the reported diarrhea.  If C. difficile testing negative, patient needs a colonoscopy.  Given his age, recent illnesses and current condition, we would also need most recent cardiology office note and echocardiogram for risk assessment and because he would need a 2-day hold of Xarelto.   Thank you for the courtesy of this consult.  Please call me with any questions or concerns.  Nelida Meuse III  CC: Referring provider noted above ___________________________   Addendum 07/10/2021 5:36 PM  Note is made of patient's evaluation by Dr. 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  Cardiology office note dated 03/25/2021 by Dr. [name] of [facility] Indicates patient's normal pacemaker function and discussion of possible watchman device implantation.  Patient wanted to give it further consideration.  63-monthfollow-up planned. There was a large amount of associated data related to the patient's pacemaker interrogation which I am unable to interpret. Requested been made for patient's last echocardiogram, no echocardiogram report included with these records.  In care everywhere I was able to find [facility] with results as follows: "1. The study quality  is technically difficult.  2. Probably normal left ventricular function. Regional wall motion  suboptimally evaluated.  3. Normal left ventricular diastolic filling is observed.  4. The right ventricular cavity size is mildly enlarged. The right  ventricular global systolic function is normal.  5. The right atrium is mildly dilated.  6. The left atrium is mildly dilated.  7. There is mild pulmonic regurgitation present.  8. There is evidence of moderate pulmonary hypertension.  9. Mildly dilated aorta measuring 4.1cm "    H. DLoletha Carrow MD

## 2021-07-11 ENCOUNTER — Other Ambulatory Visit: Payer: Medicare Other

## 2021-07-11 DIAGNOSIS — K529 Noninfective gastroenteritis and colitis, unspecified: Secondary | ICD-10-CM

## 2021-07-11 DIAGNOSIS — Z7901 Long term (current) use of anticoagulants: Secondary | ICD-10-CM | POA: Diagnosis not present

## 2021-07-11 DIAGNOSIS — I10 Essential (primary) hypertension: Secondary | ICD-10-CM

## 2021-07-11 DIAGNOSIS — I480 Paroxysmal atrial fibrillation: Secondary | ICD-10-CM | POA: Diagnosis not present

## 2021-07-17 LAB — C. DIFFICILE GDH AND TOXIN A/B
GDH ANTIGEN: NOT DETECTED
MICRO NUMBER:: 12445981
SPECIMEN QUALITY:: ADEQUATE
TOXIN A AND B: NOT DETECTED

## 2021-07-17 LAB — OVA AND PARASITE EXAMINATION
CONCENTRATE RESULT:: NONE SEEN
MICRO NUMBER:: 12445728
SPECIMEN QUALITY:: ADEQUATE
TRICHROME RESULT:: NONE SEEN

## 2021-07-22 ENCOUNTER — Encounter: Payer: Self-pay | Admitting: Family Medicine

## 2021-07-22 ENCOUNTER — Ambulatory Visit (INDEPENDENT_AMBULATORY_CARE_PROVIDER_SITE_OTHER): Payer: Medicare Other | Admitting: Family Medicine

## 2021-07-22 ENCOUNTER — Other Ambulatory Visit: Payer: Self-pay

## 2021-07-22 VITALS — BP 112/70 | HR 94 | Temp 98.7°F | Wt 227.2 lb

## 2021-07-22 DIAGNOSIS — B379 Candidiasis, unspecified: Secondary | ICD-10-CM | POA: Diagnosis not present

## 2021-07-22 MED ORDER — KETOCONAZOLE 2 % EX CREA: 1.0000 "application " | TOPICAL_CREAM | Freq: Three times a day (TID) | CUTANEOUS | 3 refills | Status: DC

## 2021-07-22 NOTE — Progress Notes (Signed)
I agree. I did the referral. Thanks

## 2021-07-22 NOTE — Progress Notes (Signed)
   Subjective:    Patient ID: Guy Moore, male    DOB: [birth year], 87 y.o.   MRN: [id number]  HPI Here for several weeks of a painful rash in both groin areas.    Review of Systems  Constitutional: Negative.   Respiratory: Negative.    Cardiovascular: Negative.   Skin:  Positive for rash.      Objective:   Physical Exam Constitutional:      Appearance: Normal appearance.  Cardiovascular:     Rate and Rhythm: Normal rate and regular rhythm.     Pulses: Normal pulses.     Heart sounds: Normal heart sounds.  Pulmonary:     Effort: Pulmonary effort is normal.     Breath sounds: Normal breath sounds.  Skin:    Comments: The skin in both groins ir red and macerated with satellite lesions   Neurological:     Mental Status: He is alert.          Assessment & Plan:  Candidiasis, treat with Ketoconazole cream.  Alysia Penna, MD

## 2021-07-28 ENCOUNTER — Encounter (INDEPENDENT_AMBULATORY_CARE_PROVIDER_SITE_OTHER): Payer: Medicare Other | Admitting: Ophthalmology

## 2021-07-28 ENCOUNTER — Other Ambulatory Visit: Payer: Self-pay

## 2021-07-28 DIAGNOSIS — D3131 Benign neoplasm of right choroid: Secondary | ICD-10-CM

## 2021-07-28 DIAGNOSIS — H43813 Vitreous degeneration, bilateral: Secondary | ICD-10-CM | POA: Diagnosis not present

## 2021-07-28 DIAGNOSIS — H353231 Exudative age-related macular degeneration, bilateral, with active choroidal neovascularization: Secondary | ICD-10-CM

## 2021-07-28 DIAGNOSIS — H35033 Hypertensive retinopathy, bilateral: Secondary | ICD-10-CM | POA: Diagnosis not present

## 2021-07-28 DIAGNOSIS — I10 Essential (primary) hypertension: Secondary | ICD-10-CM | POA: Diagnosis not present

## 2021-07-30 ENCOUNTER — Other Ambulatory Visit: Payer: Self-pay

## 2021-07-30 ENCOUNTER — Encounter: Payer: Self-pay | Admitting: Family Medicine

## 2021-07-30 ENCOUNTER — Other Ambulatory Visit: Payer: Self-pay | Admitting: Family Medicine

## 2021-07-30 MED ORDER — AMLODIPINE BESYLATE 5 MG PO TABS: 5.0000 mg | ORAL_TABLET | Freq: Every day | ORAL | 3 refills | Status: DC

## 2021-07-31 ENCOUNTER — Telehealth: Payer: Self-pay | Admitting: Cardiology

## 2021-07-31 ENCOUNTER — Other Ambulatory Visit: Payer: Self-pay

## 2021-07-31 DIAGNOSIS — H318 Other specified disorders of choroid: Secondary | ICD-10-CM | POA: Diagnosis not present

## 2021-07-31 DIAGNOSIS — H35033 Hypertensive retinopathy, bilateral: Secondary | ICD-10-CM | POA: Diagnosis not present

## 2021-07-31 DIAGNOSIS — H04123 Dry eye syndrome of bilateral lacrimal glands: Secondary | ICD-10-CM | POA: Diagnosis not present

## 2021-07-31 DIAGNOSIS — I10 Essential (primary) hypertension: Secondary | ICD-10-CM | POA: Diagnosis not present

## 2021-07-31 DIAGNOSIS — D3131 Benign neoplasm of right choroid: Secondary | ICD-10-CM | POA: Diagnosis not present

## 2021-07-31 DIAGNOSIS — Z87891 Personal history of nicotine dependence: Secondary | ICD-10-CM | POA: Diagnosis not present

## 2021-07-31 DIAGNOSIS — Z961 Presence of intraocular lens: Secondary | ICD-10-CM | POA: Diagnosis not present

## 2021-07-31 DIAGNOSIS — D3141 Benign neoplasm of right ciliary body: Secondary | ICD-10-CM | POA: Diagnosis not present

## 2021-07-31 DIAGNOSIS — H353231 Exudative age-related macular degeneration, bilateral, with active choroidal neovascularization: Secondary | ICD-10-CM | POA: Diagnosis not present

## 2021-07-31 NOTE — Telephone Encounter (Signed)
FYI--Patient states a tumor was just found in his right eye and if it is melanoma he may need to have a procedure. If so, he will have performing office contact our office for clearance, assuming it will be needed. He is scheduled for 10/27 with Dr. Curt Bears and requested that I add him to the wait-list.

## 2021-08-04 ENCOUNTER — Encounter: Payer: Self-pay | Admitting: Family Medicine

## 2021-08-04 ENCOUNTER — Ambulatory Visit (INDEPENDENT_AMBULATORY_CARE_PROVIDER_SITE_OTHER): Payer: Medicare Other | Admitting: Family Medicine

## 2021-08-04 ENCOUNTER — Other Ambulatory Visit: Payer: Self-pay

## 2021-08-04 VITALS — BP 118/80 | HR 88 | Temp 98.0°F | Wt 230.0 lb

## 2021-08-04 DIAGNOSIS — L02215 Cutaneous abscess of perineum: Secondary | ICD-10-CM | POA: Diagnosis not present

## 2021-08-04 NOTE — Progress Notes (Signed)
   Subjective:    Patient ID: Guy Moore, male    DOB: [birth year], 87 y.o.   MRN: [id number]  HPI Here to recheck a perineal abscess that was incised and drained on 05-23-21. After that he took Doxycycline for awhile. The site had been healing up until yesterday when he noticed it was bleeding again. There is no pain.    Review of Systems  Constitutional: Negative.   Respiratory: Negative.    Cardiovascular: Negative.   Skin:  Positive for wound.      Objective:   Physical Exam Constitutional:      Appearance: Normal appearance. He is not ill-appearing.  Cardiovascular:     Rate and Rhythm: Normal rate and regular rhythm.     Pulses: Normal pulses.     Heart sounds: Normal heart sounds.  Pulmonary:     Effort: Pulmonary effort is normal.     Breath sounds: Normal breath sounds.  Genitourinary:    Comments: The original abscess site is about half the size it was the last time I saw it. There is a trickle of blood coming from it. No tenderness, no signs of infection.  Neurological:     Mental Status: He is alert.          Assessment & Plan:  Perineal abscess, now bleeding again. I advised him to contact [facility] so they can check it again this week.  Alysia Penna, MD

## 2021-08-05 ENCOUNTER — Ambulatory Visit: Payer: Medicare Other | Admitting: Family Medicine

## 2021-08-06 DIAGNOSIS — D3131 Benign neoplasm of right choroid: Secondary | ICD-10-CM | POA: Diagnosis not present

## 2021-08-06 DIAGNOSIS — H353231 Exudative age-related macular degeneration, bilateral, with active choroidal neovascularization: Secondary | ICD-10-CM | POA: Diagnosis not present

## 2021-08-06 DIAGNOSIS — Z961 Presence of intraocular lens: Secondary | ICD-10-CM | POA: Diagnosis not present

## 2021-08-06 DIAGNOSIS — D3141 Benign neoplasm of right ciliary body: Secondary | ICD-10-CM | POA: Diagnosis not present

## 2021-08-07 ENCOUNTER — Ambulatory Visit (INDEPENDENT_AMBULATORY_CARE_PROVIDER_SITE_OTHER): Payer: Medicare Other | Admitting: Cardiology

## 2021-08-07 ENCOUNTER — Encounter: Payer: Self-pay | Admitting: Cardiology

## 2021-08-07 ENCOUNTER — Telehealth: Payer: Self-pay | Admitting: Internal Medicine

## 2021-08-07 ENCOUNTER — Other Ambulatory Visit: Payer: Self-pay

## 2021-08-07 VITALS — BP 140/86 | HR 109 | Ht 70.0 in | Wt 234.6 lb

## 2021-08-07 DIAGNOSIS — I4819 Other persistent atrial fibrillation: Secondary | ICD-10-CM

## 2021-08-07 NOTE — Progress Notes (Signed)
Electrophysiology Office Note   Date:  08/07/2021   ID:  Guy Moore, DOB 03/06/1934, MRN [id number]  PCP:  Guy Morale, MD  Cardiologist:   Primary Electrophysiologist:  Guy Bonaventure Meredith Leeds, MD    Chief Complaint: pacemaker   History of Present Illness: Guy Moore is a 88 y.o. male who is being seen today for the evaluation of pacemaker at the request of Guy Morale, MD. Presenting today for electrophysiology evaluation.  He has a history significant for atrial fibrillation, hypertension, hyperlipidemia, sleep apnea.  He has a Medtronic dual-chamber pacemaker implanted 11/04/2016 implanted for sick sinus syndrome.  Per the patient, he has had multiple ablations for atrial fibrillation.  He presented to the hospital on August 2022 with bleeding from a perineal abscess.  He was also found to have a COPD exacerbation.  Today, he denies symptoms of palpitations, chest pain, shortness of breath, orthopnea, PND, lower extremity edema, claudication, dizziness, presyncope, syncope, bleeding, or neurologic sequela. The patient is tolerating medications without difficulties.    Past Medical History:  Diagnosis Date   Allergic rhinitis    Arthritis    Asthma    Atrial fibrillation [facility])    sees Dr. Mauri Moore at [facility]    Atrial flutter ([place])    [place] (basal cell carcinoma of skin)    Nose   COPD with emphysema ([place])    sees Dr. Chesley Moore    Depression    Diabetes mellitus ([place])    Diet control    Diverticulosis    Erosive esophagitis    Esophageal stricture    Gastric ulcer    Hearing loss    uses amplification   Hemorrhoids    Hiatal hernia    HTN (hypertension)    Hyperlipidemia    Knee problem    2% permanent partial impairment Right   Macular degeneration    Perineal abscess    Pneumonia    Sleep apnea    Past Surgical History:  Procedure Laterality Date   CARDIAC ELECTROPHYSIOLOGY STUDY AND ABLATION     x 4 (sees Dr.  Mertha Moore at [facility])   [place]  06/13/1979   VASECTOMY  81/15/7262   w/complications     Current Outpatient Medications  Medication Sig Dispense Refill   acetaminophen (TYLENOL) 325 MG tablet Take 2 tablets (650 mg total) by mouth every 6 (six) hours as needed for mild pain (or Fever >/= 101).     albuterol (VENTOLIN HFA) 108 (90 Base) MCG/ACT inhaler USE 2 INHALATIONS EVERY 6 HOURS AS NEEDED FOR WHEEZING OR SHORTNESS OF BREATH (Patient taking differently: Inhale 2 puffs into the lungs every 6 (six) hours as needed for wheezing or shortness of breath.) 54 g 3   amLODipine (NORVASC) 5 MG tablet Take 1 tablet (5 mg total) by mouth daily. 90 tablet 3   BESIVANCE 0.6 % SUSP Place 1 drop into both eyes every 30 (thirty) days.     fluticasone (FLOVENT DISKUS) 50 MCG/BLIST diskus inhaler Inhale 1 puff into the lungs 2 (two) times daily. 1 each 0   hypromellose (GENTEAL) 0.3 % GEL ophthalmic ointment as needed.     ipratropium-albuterol (DUONEB) 0.5-2.5 (3) MG/3ML SOLN Take 3 mLs by nebulization every 6 (six) hours as needed. 360 mL 1   ketoconazole (NIZORAL) 2 % cream Apply 1 application topically 3 (three) times daily. 60 g 3  MAGNESIUM GLUCONATE PO Take 1 tablet by mouth daily.     Methylcellulose, Laxative, (CITRUCEL PO) Take 2 tablets by mouth daily after breakfast.     Multiple Vitamins-Minerals (PRESERVISION AREDS 2) CAPS Take 1 capsule by mouth 2 (two) times daily.     omeprazole (PRILOSEC) 40 MG capsule TAKE 1 CAPSULE DAILY (Patient taking differently: Take 40 mg by mouth daily as needed (indigestion).) 90 capsule 3   potassium chloride SA (KLOR-CON) 20 MEQ tablet TAKE 1 TABLET DAILY 90 tablet 3   pravastatin (PRAVACHOL) 40 MG tablet TAKE 1 TABLET AT BEDTIME (Patient taking differently: Take 40 mg by mouth at bedtime.) 90 tablet 3   traZODone (DESYREL) 50 MG tablet Take 1 tablet (50 mg total) by mouth at bedtime. 30  tablet 2   valsartan (DIOVAN) 160 MG tablet TAKE 1 TABLET DAILY (Patient taking differently: Take 160 mg by mouth daily.) 90 tablet 3   XARELTO 20 MG TABS tablet TAKE 1 TABLET DAILY 90 tablet 3   No current facility-administered medications for this visit.    Allergies:   Patient has no known allergies.   Social History:  The patient  reports that he quit smoking about 43 years ago. His smoking use included cigarettes. He has a 28.00 pack-year smoking history. He has never used smokeless tobacco. He reports that he does not currently use alcohol. He reports that he does not use drugs.   Family History:  The patient's family history includes Arthritis in his daughter; Atrial fibrillation in his brother; Bladder Cancer in his father; Breast cancer in his brother, maternal grandmother, and mother; Colitis in his daughter; Heart attack in his mother; Heart disease in his daughter, father, maternal grandfather, and maternal grandmother; Hypertension in his brother and father; Other in his mother; Parkinson's disease in his father; Stroke in his brother, father, and mother; Tremor in his brother.    ROS:  Please see the history of present illness.   Otherwise, review of systems is positive for none.   All other systems are reviewed and negative.    PHYSICAL EXAM: VS:  BP 140/86   Pulse (!) 109   Ht 5\' 10"  (1.778 m)   Wt 234 lb 9.6 oz (106.4 kg)   SpO2 98%   BMI 33.66 kg/m  , BMI Body mass index is 33.66 kg/m. GEN: Well nourished, well developed, in no acute distress  HEENT: normal  Neck: no JVD, carotid bruits, or masses Cardiac: irregular; no murmurs, rubs, or gallops,no edema  Respiratory:  clear to auscultation bilaterally, normal work of breathing GI: soft, nontender, nondistended, + BS MS: no deformity or atrophy  Skin: warm and dry, device pocket is well healed Neuro:  Strength and sensation are intact Psych: euthymic mood, full affect  EKG:  EKG is ordered today. Personal  review of the ekg ordered shows atrial fibrillation, rate 109  Device interrogation is reviewed today in detail.  See PaceArt for details.   Recent Labs: 05/24/2021: ALT 17 05/25/2021: B Natriuretic Peptide 161.2 07/10/2021: BUN 10; Creatinine, Ser 0.86; Hemoglobin 13.0; Platelets 271.0; Potassium 3.5; Sodium 135    Lipid Panel     Component Value Date/Time   CHOL 158 06/26/2020 0851   TRIG 39 06/26/2020 0851   HDL 60 06/26/2020 0851   CHOLHDL 2.6 06/26/2020 0851   VLDL 6.2 02/20/2020 1049   LDLCALC 87 06/26/2020 0851   LDLDIRECT 51.0 04/12/2015 1435     Wt Readings from Last 3 Encounters:  08/07/21 234 lb 9.6  oz (106.4 kg)  08/04/21 230 lb (104.3 kg)  07/22/21 227 lb 4 oz (103.1 kg)      Other studies Reviewed: Additional studies/ records that were reviewed today include: TTE 2017  Review of the above records today demonstrates:  1. The study quality is technically difficult.  2. Probably normal left ventricular function. Regional wall motion  suboptimally evaluated.  3. Normal left ventricular diastolic filling is observed.  4. The right ventricular cavity size is mildly enlarged. The right  ventricular global systolic function is normal.  5. The right atrium is mildly dilated.  6. The left atrium is mildly dilated.  7. There is mild pulmonic regurgitation present.  8. There is evidence of moderate pulmonary hypertension.  9. Mildly dilated aorta measuring 4.1cm    ASSESSMENT AND PLAN:  1.  Sick sinus syndrome: Status post Medtronic dual-chamber pacemaker implanted 11/04/2016.  Device functioning appropriately.  We Khamila Bassinger arrange for follow-up in device clinic.  2.  Persistent atrial fibrillation: Currently on Xarelto 20 mg daily.  CHA2DS2-VASc of 4.  Patient states that he has had multiple ablations for atrial fibrillation as well as possible SVT ablations.  We Constantino Starace work to get the records from his prior cardiologist.  At this point, he is feeling well and is  comfortable with his overall control.  No changes.  3.  Hypertension: Mildly elevated.  We Taliesin Hartlage continue to monitor.  Current medicines are reviewed at length with the patient today.   The patient does not have concerns regarding his medicines.  The following changes were made today:  none  Labs/ tests ordered today include:  Orders Placed This Encounter  Procedures   EKG 12-Lead     Disposition:   FU with Meshia Rau 6 months  Signed, Tiane Szydlowski Meredith Leeds, MD  08/07/2021 10:16 AM     [facility] [address] [place] [address] [phone] (office) [phone] (fax)

## 2021-08-07 NOTE — Telephone Encounter (Signed)
Left message with rescheduled upcoming appointment due to provider on call. 

## 2021-08-08 ENCOUNTER — Encounter: Payer: Self-pay | Admitting: Family Medicine

## 2021-08-08 ENCOUNTER — Ambulatory Visit (INDEPENDENT_AMBULATORY_CARE_PROVIDER_SITE_OTHER): Payer: Medicare Other

## 2021-08-08 DIAGNOSIS — E538 Deficiency of other specified B group vitamins: Secondary | ICD-10-CM | POA: Diagnosis not present

## 2021-08-08 MED ORDER — CYANOCOBALAMIN 1000 MCG/ML IJ SOLN
1000.0000 ug | Freq: Once | INTRAMUSCULAR | Status: AC
  Administered 2021-08-08: 1000 ug via INTRAMUSCULAR

## 2021-08-08 NOTE — Progress Notes (Signed)
Per orders of Dr. Jerilee Hoh, injection of B12 given by Rodrigo Ran. Patient tolerated injection well.

## 2021-08-12 DIAGNOSIS — L531 Erythema annulare centrifugum: Secondary | ICD-10-CM | POA: Diagnosis not present

## 2021-08-12 DIAGNOSIS — D492 Neoplasm of unspecified behavior of bone, soft tissue, and skin: Secondary | ICD-10-CM | POA: Diagnosis not present

## 2021-08-12 DIAGNOSIS — L821 Other seborrheic keratosis: Secondary | ICD-10-CM | POA: Diagnosis not present

## 2021-08-14 DIAGNOSIS — D492 Neoplasm of unspecified behavior of bone, soft tissue, and skin: Secondary | ICD-10-CM | POA: Diagnosis not present

## 2021-08-15 NOTE — Telephone Encounter (Signed)
Yes go ahead with both shots, but space them out 2 weeks apart

## 2021-08-17 DIAGNOSIS — Z95818 Presence of other cardiac implants and grafts: Secondary | ICD-10-CM | POA: Diagnosis not present

## 2021-08-19 DIAGNOSIS — N281 Cyst of kidney, acquired: Secondary | ICD-10-CM | POA: Diagnosis not present

## 2021-08-19 DIAGNOSIS — N200 Calculus of kidney: Secondary | ICD-10-CM | POA: Diagnosis not present

## 2021-08-25 ENCOUNTER — Other Ambulatory Visit: Payer: Self-pay

## 2021-08-25 ENCOUNTER — Encounter (INDEPENDENT_AMBULATORY_CARE_PROVIDER_SITE_OTHER): Payer: Medicare Other | Admitting: Ophthalmology

## 2021-08-25 DIAGNOSIS — H43813 Vitreous degeneration, bilateral: Secondary | ICD-10-CM | POA: Diagnosis not present

## 2021-08-25 DIAGNOSIS — H353231 Exudative age-related macular degeneration, bilateral, with active choroidal neovascularization: Secondary | ICD-10-CM

## 2021-08-25 DIAGNOSIS — H35033 Hypertensive retinopathy, bilateral: Secondary | ICD-10-CM

## 2021-08-25 DIAGNOSIS — I10 Essential (primary) hypertension: Secondary | ICD-10-CM | POA: Diagnosis not present

## 2021-08-25 DIAGNOSIS — D3131 Benign neoplasm of right choroid: Secondary | ICD-10-CM | POA: Diagnosis not present

## 2021-08-25 DIAGNOSIS — L309 Dermatitis, unspecified: Secondary | ICD-10-CM | POA: Diagnosis not present

## 2021-08-27 ENCOUNTER — Other Ambulatory Visit: Payer: Medicare Other

## 2021-08-27 ENCOUNTER — Ambulatory Visit: Payer: Medicare Other | Admitting: Internal Medicine

## 2021-09-01 ENCOUNTER — Inpatient Hospital Stay: Payer: Medicare Other | Attending: Internal Medicine

## 2021-09-01 ENCOUNTER — Inpatient Hospital Stay (HOSPITAL_BASED_OUTPATIENT_CLINIC_OR_DEPARTMENT_OTHER): Payer: Medicare Other | Admitting: Internal Medicine

## 2021-09-01 ENCOUNTER — Other Ambulatory Visit: Payer: Self-pay

## 2021-09-01 VITALS — BP 152/77 | HR 88 | Temp 97.9°F | Resp 18 | Wt 238.7 lb

## 2021-09-01 DIAGNOSIS — Z7901 Long term (current) use of anticoagulants: Secondary | ICD-10-CM | POA: Diagnosis not present

## 2021-09-01 DIAGNOSIS — I10 Essential (primary) hypertension: Secondary | ICD-10-CM | POA: Insufficient documentation

## 2021-09-01 DIAGNOSIS — E611 Iron deficiency: Secondary | ICD-10-CM | POA: Diagnosis not present

## 2021-09-01 DIAGNOSIS — D696 Thrombocytopenia, unspecified: Secondary | ICD-10-CM | POA: Insufficient documentation

## 2021-09-01 DIAGNOSIS — Z79899 Other long term (current) drug therapy: Secondary | ICD-10-CM | POA: Diagnosis not present

## 2021-09-01 DIAGNOSIS — D539 Nutritional anemia, unspecified: Secondary | ICD-10-CM

## 2021-09-01 DIAGNOSIS — E119 Type 2 diabetes mellitus without complications: Secondary | ICD-10-CM | POA: Insufficient documentation

## 2021-09-01 DIAGNOSIS — I4891 Unspecified atrial fibrillation: Secondary | ICD-10-CM | POA: Diagnosis not present

## 2021-09-01 DIAGNOSIS — D72819 Decreased white blood cell count, unspecified: Secondary | ICD-10-CM | POA: Insufficient documentation

## 2021-09-01 LAB — FERRITIN: Ferritin: 90 ng/mL (ref 24–336)

## 2021-09-01 LAB — VITAMIN B12: Vitamin B-12: 391 pg/mL (ref 180–914)

## 2021-09-01 LAB — CBC WITH DIFFERENTIAL (CANCER CENTER ONLY)
Abs Immature Granulocytes: 0.01 10*3/uL (ref 0.00–0.07)
Basophils Absolute: 0.1 10*3/uL (ref 0.0–0.1)
Basophils Relative: 2 %
Eosinophils Absolute: 0.2 10*3/uL (ref 0.0–0.5)
Eosinophils Relative: 4 %
HCT: 40.1 % (ref 39.0–52.0)
Hemoglobin: 13 g/dL (ref 13.0–17.0)
Immature Granulocytes: 0 %
Lymphocytes Relative: 12 %
Lymphs Abs: 0.5 10*3/uL — ABNORMAL LOW (ref 0.7–4.0)
MCH: 29.1 pg (ref 26.0–34.0)
MCHC: 32.4 g/dL (ref 30.0–36.0)
MCV: 89.7 fL (ref 80.0–100.0)
Monocytes Absolute: 0.3 10*3/uL (ref 0.1–1.0)
Monocytes Relative: 8 %
Neutro Abs: 2.9 10*3/uL (ref 1.7–7.7)
Neutrophils Relative %: 74 %
Platelet Count: 132 10*3/uL — ABNORMAL LOW (ref 150–400)
RBC: 4.47 MIL/uL (ref 4.22–5.81)
RDW: 17.2 % — ABNORMAL HIGH (ref 11.5–15.5)
WBC Count: 3.9 10*3/uL — ABNORMAL LOW (ref 4.0–10.5)
nRBC: 0 % (ref 0.0–0.2)

## 2021-09-01 LAB — IRON AND TIBC
Iron: 81 ug/dL (ref 42–163)
Saturation Ratios: 22 % (ref 20–55)
TIBC: 368 ug/dL (ref 202–409)
UIBC: 287 ug/dL (ref 117–376)

## 2021-09-01 LAB — FOLATE: Folate: 7.7 ng/mL (ref >5.9)

## 2021-09-01 LAB — CMP (CANCER CENTER ONLY)
ALT: 13 U/L (ref 0–44)
AST: 16 U/L (ref 15–41)
Albumin: 4 g/dL (ref 3.5–5.0)
Alkaline Phosphatase: 54 U/L (ref 38–126)
Anion gap: 9 (ref 5–15)
BUN: 21 mg/dL (ref 8–23)
CO2: 25 mmol/L (ref 22–32)
Calcium: 8.9 mg/dL (ref 8.9–10.3)
Chloride: 105 mmol/L (ref 98–111)
Creatinine: 0.93 mg/dL (ref 0.61–1.24)
GFR, Estimated: >60 mL/min (ref >60)
Glucose, Bld: 114 mg/dL — ABNORMAL HIGH (ref 70–99)
Potassium: 4.1 mmol/L (ref 3.5–5.1)
Sodium: 139 mmol/L (ref 135–145)
Total Bilirubin: 0.8 mg/dL (ref 0.3–1.2)
Total Protein: 6.9 g/dL (ref 6.5–8.1)

## 2021-09-01 LAB — LACTATE DEHYDROGENASE: LDH: 145 U/L (ref 98–192)

## 2021-09-01 NOTE — Progress Notes (Signed)
Guy Moore [phone]   [phone]  OFFICE PROGRESS NOTE  [place], [place] [address]  DIAGNOSIS: Intermittent Bicytopenia   PRIOR THERAPY: None   CURRENT THERAPY:  Vitamin B12 injection on a monthly basis  INTERVAL HISTORY: Guy Moore 87 y.o. male returns to the clinic today for follow-up visit accompanied by his wife.  The patient is feeling fine today with no concerning complaints.  He had admission for perineal abscess as well as pneumonia few months ago and treated with aggressive courses of antibiotics.  He was also seen by his ophthalmologist recently and there is suspicious of questionable tumor on the right eye that currently being evaluated and expected to have ultrasound of the eye soon at [facility].  The patient denied having any current chest pain, shortness of breath, cough or hemoptysis.  He denied having any fever or chills.  He has no nausea, vomiting, diarrhea or constipation.  He has no headache or visual changes.  He is here today for evaluation with repeat blood work.  MEDICAL HISTORY: Past Medical History:  Diagnosis Date   Allergic rhinitis    Arthritis    Asthma    Atrial fibrillation [facility])    sees Dr. [name] at [facility]    Atrial flutter ([place])    [place] (basal cell carcinoma of skin)    Nose   COPD with emphysema ([place])    sees Dr. [name]    Depression    Diabetes mellitus ([place])    Diet control    Diverticulosis    Erosive esophagitis    Esophageal stricture    Gastric ulcer    Hearing loss    uses amplification   Hemorrhoids    Hiatal hernia    HTN (hypertension)    Hyperlipidemia    Knee problem    2% permanent partial impairment Right   Macular degeneration    Perineal abscess    Pneumonia    Sleep apnea     ALLERGIES:  has No Known Allergies.  MEDICATIONS:  Current Outpatient Medications  Medication Sig Dispense  Refill   acetaminophen (TYLENOL) 325 MG tablet Take 2 tablets (650 mg total) by mouth every 6 (six) hours as needed for mild pain (or Fever >/= 101).     albuterol (VENTOLIN HFA) 108 (90 Base) MCG/ACT inhaler USE 2 INHALATIONS EVERY 6 HOURS AS NEEDED FOR WHEEZING OR SHORTNESS OF BREATH (Patient taking differently: Inhale 2 puffs into the lungs every 6 (six) hours as needed for wheezing or shortness of breath.) 54 g 3   amLODipine (NORVASC) 5 MG tablet Take 1 tablet (5 mg total) by mouth daily. 90 tablet 3   BESIVANCE 0.6 % SUSP Place 1 drop into both eyes every 30 (thirty) days.     fluticasone (FLOVENT DISKUS) 50 MCG/BLIST diskus inhaler Inhale 1 puff into the lungs 2 (two) times daily. 1 each 0   hypromellose (GENTEAL) 0.3 % GEL ophthalmic ointment as needed.     ipratropium-albuterol (DUONEB) 0.5-2.5 (3) MG/3ML SOLN Take 3 mLs by nebulization every 6 (six) hours as needed. 360 mL 1   ketoconazole (NIZORAL) 2 % cream Apply 1 application topically 3 (three) times daily. 60 g 3   MAGNESIUM GLUCONATE PO Take 1 tablet by mouth daily.     Methylcellulose, Laxative, (CITRUCEL PO) Take 2 tablets by mouth daily after breakfast.     Multiple Vitamins-Minerals (PRESERVISION  AREDS 2) CAPS Take 1 capsule by mouth 2 (two) times daily.     omeprazole (PRILOSEC) 40 MG capsule TAKE 1 CAPSULE DAILY (Patient taking differently: Take 40 mg by mouth daily as needed (indigestion).) 90 capsule 3   potassium chloride SA (KLOR-CON) 20 MEQ tablet TAKE 1 TABLET DAILY 90 tablet 3   pravastatin (PRAVACHOL) 40 MG tablet TAKE 1 TABLET AT BEDTIME (Patient taking differently: Take 40 mg by mouth at bedtime.) 90 tablet 3   traZODone (DESYREL) 50 MG tablet Take 1 tablet (50 mg total) by mouth at bedtime. 30 tablet 2   valsartan (DIOVAN) 160 MG tablet TAKE 1 TABLET DAILY (Patient taking differently: Take 160 mg by mouth daily.) 90 tablet 3   XARELTO 20 MG TABS tablet TAKE 1 TABLET DAILY 90 tablet 3   No current  facility-administered medications for this visit.    SURGICAL HISTORY:  Past Surgical History:  Procedure Laterality Date   CARDIAC ELECTROPHYSIOLOGY STUDY AND ABLATION     x 4 (sees Dr. [name] at [facility])   [place]  06/13/1979   VASECTOMY  00/76/2263   w/complications    REVIEW OF SYSTEMS:  A comprehensive review of systems was negative.   PHYSICAL EXAMINATION: General appearance: alert, cooperative, and no distress Head: Normocephalic, without obvious abnormality, atraumatic Neck: no adenopathy, no JVD, supple, symmetrical, trachea midline, and thyroid not enlarged, symmetric, no tenderness/mass/nodules Lymph nodes: Cervical, supraclavicular, and axillary nodes normal. Resp: clear to auscultation bilaterally Back: symmetric, no curvature. ROM normal. No CVA tenderness. Cardio: regular rate and rhythm, S1, S2 normal, no murmur, click, rub or gallop GI: soft, non-tender; bowel sounds normal; no masses,  no organomegaly Extremities: extremities normal, atraumatic, no cyanosis or edema  ECOG PERFORMANCE STATUS: 1 - Symptomatic but completely ambulatory  Blood pressure (!) 152/77, pulse 88, temperature 97.9 F (36.6 C), resp. rate 18, weight 238 lb 11.2 oz (108.3 kg), SpO2 98 %.  LABORATORY DATA: Lab Results  Component Value Date   WBC 3.9 (L) 09/01/2021   HGB 13.0 09/01/2021   HCT 40.1 09/01/2021   MCV 89.7 09/01/2021   PLT 132 (L) 09/01/2021      Chemistry      Component Value Date/Time   NA 135 07/10/2021 0943   NA 141 06/27/2015 0000   K 3.5 07/10/2021 0943   CL 94 (L) 07/10/2021 0943   CO2 30 07/10/2021 0943   BUN 10 07/10/2021 0943   BUN 21 06/27/2015 0000   CREATININE 0.86 07/10/2021 0943   CREATININE 1.07 02/24/2021 1333   GLU 121 06/27/2015 0000      Component Value Date/Time   CALCIUM 8.7 07/10/2021 0943   ALKPHOS 51 05/24/2021 1439   AST 18 05/24/2021 1439   AST 18  02/24/2021 1333   ALT 17 05/24/2021 1439   ALT 14 02/24/2021 1333   BILITOT 1.0 05/24/2021 1439   BILITOT 0.9 02/24/2021 1333       RADIOGRAPHIC STUDIES: No results found.  ASSESSMENT AND PLAN: This is a very pleasant 87 years old white male with bicytopenia including leukocytopenia and thrombocytopenia likely secondary to iron deficiency but early MDS could not be completely excluded. The patient had a bone marrow biopsy and aspirate performed recently that was nonspecific but again could not rule out early myelodysplastic syndrome. The patient continues to do well with no concerning complaints.  He is tolerating his treatment with the vitamin B12 injection  fairly well. Repeat CBC today showed very mild leukocytopenia and thrombocytopenia but he has normal hemoglobin and hematocrit. The remaining anemia panels are still pending. I recommended for the patient to continue with the vitamin B12 injection on monthly basis. I will see him back for follow-up visit in 6 months for evaluation and repeat blood work. He was advised to call immediately if he has any other concerning symptoms in the interval. The patient voices understanding of current disease status and treatment options and is in agreement with the current care plan.  All questions were answered. The patient knows to call the clinic with any problems, questions or concerns. We can certainly see the patient much sooner if necessary.  Disclaimer: This note was dictated with voice recognition software. Similar sounding words can inadvertently be transcribed and may not be corrected upon review.

## 2021-09-01 NOTE — Addendum Note (Signed)
Addended by: Ardeen Garland on: 09/01/2021 02:43 PM   Modules accepted: Orders

## 2021-09-10 ENCOUNTER — Telehealth: Payer: Self-pay | Admitting: Internal Medicine

## 2021-09-10 NOTE — Telephone Encounter (Signed)
Sch per 11/21 los, pt aware 

## 2021-09-15 DIAGNOSIS — H6123 Impacted cerumen, bilateral: Secondary | ICD-10-CM | POA: Diagnosis not present

## 2021-09-22 ENCOUNTER — Encounter (INDEPENDENT_AMBULATORY_CARE_PROVIDER_SITE_OTHER): Payer: Medicare Other | Admitting: Ophthalmology

## 2021-09-22 ENCOUNTER — Other Ambulatory Visit: Payer: Self-pay

## 2021-09-22 ENCOUNTER — Other Ambulatory Visit: Payer: Self-pay | Admitting: Family Medicine

## 2021-09-22 DIAGNOSIS — I10 Essential (primary) hypertension: Secondary | ICD-10-CM

## 2021-09-22 DIAGNOSIS — D3131 Benign neoplasm of right choroid: Secondary | ICD-10-CM | POA: Diagnosis not present

## 2021-09-22 DIAGNOSIS — H43813 Vitreous degeneration, bilateral: Secondary | ICD-10-CM

## 2021-09-22 DIAGNOSIS — H35033 Hypertensive retinopathy, bilateral: Secondary | ICD-10-CM

## 2021-09-22 DIAGNOSIS — H353231 Exudative age-related macular degeneration, bilateral, with active choroidal neovascularization: Secondary | ICD-10-CM | POA: Diagnosis not present

## 2021-09-23 DIAGNOSIS — Z961 Presence of intraocular lens: Secondary | ICD-10-CM | POA: Diagnosis not present

## 2021-09-23 DIAGNOSIS — D3141 Benign neoplasm of right ciliary body: Secondary | ICD-10-CM | POA: Diagnosis not present

## 2021-09-23 DIAGNOSIS — D3131 Benign neoplasm of right choroid: Secondary | ICD-10-CM | POA: Diagnosis not present

## 2021-09-23 DIAGNOSIS — H353231 Exudative age-related macular degeneration, bilateral, with active choroidal neovascularization: Secondary | ICD-10-CM | POA: Diagnosis not present

## 2021-09-25 ENCOUNTER — Ambulatory Visit (INDEPENDENT_AMBULATORY_CARE_PROVIDER_SITE_OTHER): Payer: Medicare Other

## 2021-09-25 DIAGNOSIS — E538 Deficiency of other specified B group vitamins: Secondary | ICD-10-CM | POA: Diagnosis not present

## 2021-09-25 MED ORDER — CYANOCOBALAMIN 1000 MCG/ML IJ SOLN
1000.0000 ug | Freq: Once | INTRAMUSCULAR | Status: AC
Start: 2021-09-25 — End: 2021-09-25
  Administered 2021-09-25: 1000 ug via INTRAMUSCULAR

## 2021-09-25 NOTE — Progress Notes (Signed)
Per orders of Laurey Morale, MD, injection of B12 given in  left deltoid by Nyja Westbrook D Everlena Mackley. Patient tolerated injection well.  Lab Results  Component Value Date   [id number] 093 09/01/2021

## 2021-09-30 DIAGNOSIS — L819 Disorder of pigmentation, unspecified: Secondary | ICD-10-CM | POA: Diagnosis not present

## 2021-09-30 DIAGNOSIS — L309 Dermatitis, unspecified: Secondary | ICD-10-CM | POA: Diagnosis not present

## 2021-09-30 DIAGNOSIS — L570 Actinic keratosis: Secondary | ICD-10-CM | POA: Diagnosis not present

## 2021-10-01 ENCOUNTER — Encounter: Payer: Self-pay | Admitting: Cardiology

## 2021-10-02 ENCOUNTER — Telehealth: Payer: Self-pay

## 2021-10-02 NOTE — Telephone Encounter (Signed)
Spoke with patient informed that what he saw " see scanned report" just meant that a copy of his pacemaker check had been scanned into Epic, asked patient who he wanted to follow his pacemaker remotely and he stated that he wanted Dr. Curt Bears to follow his pacemaker asked patient to call Dr. Zadie Cleverly office with [facility] and have them release him in [place] so we could pick him up patient voiced understanding and stated he would.

## 2021-10-20 ENCOUNTER — Other Ambulatory Visit: Payer: Self-pay

## 2021-10-20 ENCOUNTER — Encounter (INDEPENDENT_AMBULATORY_CARE_PROVIDER_SITE_OTHER): Payer: Medicare Other | Admitting: Ophthalmology

## 2021-10-20 DIAGNOSIS — H43813 Vitreous degeneration, bilateral: Secondary | ICD-10-CM

## 2021-10-20 DIAGNOSIS — I10 Essential (primary) hypertension: Secondary | ICD-10-CM

## 2021-10-20 DIAGNOSIS — H353231 Exudative age-related macular degeneration, bilateral, with active choroidal neovascularization: Secondary | ICD-10-CM

## 2021-10-20 DIAGNOSIS — D3131 Benign neoplasm of right choroid: Secondary | ICD-10-CM

## 2021-10-20 DIAGNOSIS — H35033 Hypertensive retinopathy, bilateral: Secondary | ICD-10-CM

## 2021-10-27 ENCOUNTER — Ambulatory Visit (INDEPENDENT_AMBULATORY_CARE_PROVIDER_SITE_OTHER): Payer: Medicare Other | Admitting: *Deleted

## 2021-10-27 DIAGNOSIS — E538 Deficiency of other specified B group vitamins: Secondary | ICD-10-CM | POA: Diagnosis not present

## 2021-10-27 MED ORDER — CYANOCOBALAMIN 1000 MCG/ML IJ SOLN
1000.0000 ug | Freq: Once | INTRAMUSCULAR | Status: AC
  Administered 2021-10-27: 1000 ug via INTRAMUSCULAR

## 2021-10-27 NOTE — Progress Notes (Signed)
Per orders of Dr. [place], injection of Cyanocobalamin 1000mcg given by Burnadette Baskett A. Patient tolerated injection well.  

## 2021-11-06 DIAGNOSIS — L570 Actinic keratosis: Secondary | ICD-10-CM | POA: Diagnosis not present

## 2021-11-06 DIAGNOSIS — L578 Other skin changes due to chronic exposure to nonionizing radiation: Secondary | ICD-10-CM | POA: Diagnosis not present

## 2021-11-10 ENCOUNTER — Ambulatory Visit (INDEPENDENT_AMBULATORY_CARE_PROVIDER_SITE_OTHER): Payer: Medicare Other

## 2021-11-10 DIAGNOSIS — I4819 Other persistent atrial fibrillation: Secondary | ICD-10-CM | POA: Diagnosis not present

## 2021-11-10 LAB — CUP PACEART REMOTE DEVICE CHECK
Battery Remaining Longevity: 107 mo
Battery Voltage: 2.99 V
Brady Statistic AP VP Percent: 0 %
Brady Statistic AP VS Percent: 0 %
Brady Statistic AS VP Percent: 72.89 %
Brady Statistic AS VS Percent: 27.11 %
Brady Statistic RA Percent Paced: 0 %
Brady Statistic RV Percent Paced: 72.89 %
Date Time Interrogation Session: 20230130022359
Implantable Lead Implant Date: 20180124
Implantable Lead Implant Date: 20180124
Implantable Lead Location: 753859
Implantable Lead Location: 753860
Implantable Lead Model: 5076
Implantable Lead Model: 5076
Implantable Pulse Generator Implant Date: 20180124
Lead Channel Impedance Value: 304 Ohm
Lead Channel Impedance Value: 323 Ohm
Lead Channel Impedance Value: 399 Ohm
Lead Channel Impedance Value: 418 Ohm
Lead Channel Pacing Threshold Amplitude: 0.75 V
Lead Channel Pacing Threshold Pulse Width: 0.4 ms
Lead Channel Sensing Intrinsic Amplitude: 0.25 mV
Lead Channel Sensing Intrinsic Amplitude: 0.25 mV
Lead Channel Sensing Intrinsic Amplitude: 6 mV
Lead Channel Sensing Intrinsic Amplitude: 6 mV
Lead Channel Setting Pacing Amplitude: 1.5 V
Lead Channel Setting Pacing Pulse Width: 0.4 ms
Lead Channel Setting Sensing Sensitivity: 0.9 mV

## 2021-11-13 ENCOUNTER — Ambulatory Visit (INDEPENDENT_AMBULATORY_CARE_PROVIDER_SITE_OTHER): Payer: Medicare Other

## 2021-11-13 VITALS — BP 137/89 | Ht 70.0 in | Wt 238.0 lb

## 2021-11-13 DIAGNOSIS — Z0000 Encounter for general adult medical examination without abnormal findings: Secondary | ICD-10-CM

## 2021-11-13 NOTE — Patient Instructions (Addendum)
[name] , Thank you for taking time to come for your Medicare Wellness Visit. I appreciate your ongoing commitment to your health goals. Please review the following plan we discussed and let me know if I can assist you in the future.   These are the goals we discussed:  Goals       Exercise 3x per week (30 min per time) (pt-stated)      Will continue to increase physical activity.      Patient Stated      I would like to stay alive!         This is a list of the screening recommended for you and due dates:  Health Maintenance  Topic Date Due   Eye exam for diabetics  06/26/2010   Complete foot exam   06/28/2015   COVID-19 Vaccine (6 - Booster for Pfizer series) 11/29/2021*   Hemoglobin A1C  12/11/2021*   Flu Shot  01/09/2022*   Zoster (Shingles) Vaccine (1 of 2) 02/10/2022*   Tetanus Vaccine  11/13/2022*   Pneumonia Vaccine  Completed   HPV Vaccine  Aged Out  *Topic was postponed. The date shown is not the original due date.   Advanced directives: Yes Patient will  bring copy   Conditions/risks identified: None  Next appointment: Follow up in one year for your annual wellness visit.   Preventive Care 45 Years and Older, Male Preventive care refers to lifestyle choices and visits with your health care provider that can promote health and wellness. What does preventive care include? A yearly physical exam. This is also called an annual well check. Dental exams once or twice a year. Routine eye exams. Ask your health care provider how often you should have your eyes checked. Personal lifestyle choices, including: Daily care of your teeth and gums. Regular physical activity. Eating a healthy diet. Avoiding tobacco and drug use. Limiting alcohol use. Practicing safe sex. Taking low doses of aspirin every day. Taking vitamin and mineral supplements as recommended by your health care provider. What happens during an annual well check? The services and screenings done  by your health care provider during your annual well check will depend on your age, overall health, lifestyle risk factors, and family history of disease. Counseling  Your health care provider may ask you questions about your: Alcohol use. Tobacco use. Drug use. Emotional well-being. Home and relationship well-being. Sexual activity. Eating habits. History of falls. Memory and ability to understand (cognition). Work and work Statistician. Screening  You may have the following tests or measurements: Height, weight, and BMI. Blood pressure. Lipid and cholesterol levels. These may be checked every 5 years, or more frequently if you are over 10 years old. Skin check. Lung cancer screening. You may have this screening every year starting at age 81 if you have a 30-pack-year history of smoking and currently smoke or have quit within the past 15 years. Fecal occult blood test (FOBT) of the stool. You may have this test every year starting at age 11. Flexible sigmoidoscopy or colonoscopy. You may have a sigmoidoscopy every 5 years or a colonoscopy every 10 years starting at age 49. Prostate cancer screening. Recommendations will vary depending on your family history and other risks. Hepatitis C blood test. Hepatitis B blood test. Sexually transmitted disease (STD) testing. Diabetes screening. This is done by checking your blood sugar (glucose) after you have not eaten for a while (fasting). You may have this done every 1-3 years. Abdominal aortic aneurysm (AAA)  screening. You may need this if you are a current or former smoker. Osteoporosis. You may be screened starting at age 65 if you are at high risk. Talk with your health care provider about your test results, treatment options, and if necessary, the need for more tests. Vaccines  Your health care provider may recommend certain vaccines, such as: Influenza vaccine. This is recommended every year. Tetanus, diphtheria, and acellular  pertussis (Tdap, Td) vaccine. You may need a Td booster every 10 years. Zoster vaccine. You may need this after age 16. Pneumococcal 13-valent conjugate (PCV13) vaccine. One dose is recommended after age 81. Pneumococcal polysaccharide (PPSV23) vaccine. One dose is recommended after age 16. Talk to your health care provider about which screenings and vaccines you need and how often you need them. This information is not intended to replace advice given to you by your health care provider. Make sure you discuss any questions you have with your health care provider. Document Released: 10/25/2015 Document Revised: 06/17/2016 Document Reviewed: 07/30/2015 Elsevier Interactive Patient Education  2017 [employer] Prevention in the Home Falls can cause injuries. They can happen to people of all ages. There are many things you can do to make your home safe and to help prevent falls. What can I do on the outside of my home? Regularly fix the edges of walkways and driveways and fix any cracks. Remove anything that might make you trip as you walk through a door, such as a raised step or threshold. Trim any bushes or trees on the path to your home. Use bright outdoor lighting. Clear any walking paths of anything that might make someone trip, such as rocks or tools. Regularly check to see if handrails are loose or broken. Make sure that both sides of any steps have handrails. Any raised decks and porches should have guardrails on the edges. Have any leaves, snow, or ice cleared regularly. Use sand or salt on walking paths during winter. Clean up any spills in your garage right away. This includes oil or grease spills. What can I do in the bathroom? Use night lights. Install grab bars by the toilet and in the tub and shower. Do not use towel bars as grab bars. Use non-skid mats or decals in the tub or shower. If you need to sit down in the shower, use a plastic, non-slip stool. Keep the floor  dry. Clean up any water that spills on the floor as soon as it happens. Remove soap buildup in the tub or shower regularly. Attach bath mats securely with double-sided non-slip rug tape. Do not have throw rugs and other things on the floor that can make you trip. What can I do in the bedroom? Use night lights. Make sure that you have a light by your bed that is easy to reach. Do not use any sheets or blankets that are too big for your bed. They should not hang down onto the floor. Have a firm chair that has side arms. You can use this for support while you get dressed. Do not have throw rugs and other things on the floor that can make you trip. What can I do in the kitchen? Clean up any spills right away. Avoid walking on wet floors. Keep items that you use a lot in easy-to-reach places. If you need to reach something above you, use a strong step stool that has a grab bar. Keep electrical cords out of the way. Do not use floor polish or  wax that makes floors slippery. If you must use wax, use non-skid floor wax. Do not have throw rugs and other things on the floor that can make you trip. What can I do with my stairs? Do not leave any items on the stairs. Make sure that there are handrails on both sides of the stairs and use them. Fix handrails that are broken or loose. Make sure that handrails are as long as the stairways. Check any carpeting to make sure that it is firmly attached to the stairs. Fix any carpet that is loose or worn. Avoid having throw rugs at the top or bottom of the stairs. If you do have throw rugs, attach them to the floor with carpet tape. Make sure that you have a light switch at the top of the stairs and the bottom of the stairs. If you do not have them, ask someone to add them for you. What else can I do to help prevent falls? Wear shoes that: Do not have high heels. Have rubber bottoms. Are comfortable and fit you well. Are closed at the toe. Do not wear  sandals. If you use a stepladder: Make sure that it is fully opened. Do not climb a closed stepladder. Make sure that both sides of the stepladder are locked into place. Ask someone to hold it for you, if possible. Clearly mark and make sure that you can see: Any grab bars or handrails. First and last steps. Where the edge of each step is. Use tools that help you move around (mobility aids) if they are needed. These include: Canes. Walkers. Scooters. Crutches. Turn on the lights when you go into a dark area. Replace any light bulbs as soon as they burn out. Set up your furniture so you have a clear path. Avoid moving your furniture around. If any of your floors are uneven, fix them. If there are any pets around you, be aware of where they are. Review your medicines with your doctor. Some medicines can make you feel dizzy. This can increase your chance of falling. Ask your doctor what other things that you can do to help prevent falls. This information is not intended to replace advice given to you by your health care provider. Make sure you discuss any questions you have with your health care provider. Document Released: 07/25/2009 Document Revised: 03/05/2016 Document Reviewed: 11/02/2014 Elsevier Interactive Patient Education  2017 [employer].

## 2021-11-13 NOTE — Progress Notes (Signed)
Subjective:   Guy Moore is a 88 y.o. male who presents for Medicare Annual/Subsequent preventive examination.  Review of Systems    Virtual Visit via Telephone Note  I connected with  DONN Moore on 11/13/21 at  9:45 AM EST by telephone and verified that I am speaking with the correct person using two identifiers.  Location: Patient: Home  Provider: Office Persons participating in the virtual visit: patient/Nurse Health Advisor   I discussed the limitations, risks, security and privacy concerns of performing an evaluation and management service by telephone and the availability of in person appointments. The patient expressed understanding and agreed to proceed.  Interactive audio and video telecommunications were attempted between this nurse and patient, however failed, due to patient having technical difficulties OR patient did not have access to video capability.  We continued and completed visit with audio only.  Some vital signs may be absent or patient reported.   [name], LPN  Cardiac Risk Factors include: advanced age (>67men, >23 women);hypertension;male gender     Objective:    Today's Vitals   11/13/21 0952  BP: 137/89  SpO2: 96%  Weight: 238 lb (108 kg)  Height: 5\' 10"  (1.778 m)   Body mass index is 34.15 kg/m.  Advanced Directives 11/13/2021 05/26/2021 05/25/2021 02/24/2021 11/12/2020 08/26/2020 08/19/2020  Does Patient Have a Medical Advance Directive? Yes - No Yes Yes Yes Yes  Type of Paramedic of [place];Living will - - Special educational needs teacher of [place];Living will [place];Living will [place]  Does patient want to make changes to medical advance directive? No - Patient declined - - No - Patient declined No - Patient declined No - Patient declined No - Patient declined  Copy of [place] in Chart? No - copy requested - - No - copy  requested No - copy requested No - copy requested No - copy requested  Would patient like information on creating a medical advance directive? - No - Patient declined - - - - -    Current Medications (verified) Outpatient Encounter Medications as of 11/13/2021  Medication Sig   acetaminophen (TYLENOL) 325 MG tablet Take 2 tablets (650 mg total) by mouth every 6 (six) hours as needed for mild pain (or Fever >/= 101).   albuterol (VENTOLIN HFA) 108 (90 Base) MCG/ACT inhaler USE 2 INHALATIONS EVERY 6 HOURS AS NEEDED FOR WHEEZING OR SHORTNESS OF BREATH (Patient taking differently: Inhale 2 puffs into the lungs every 6 (six) hours as needed for wheezing or shortness of breath.)   amLODipine (NORVASC) 5 MG tablet Take 1 tablet (5 mg total) by mouth daily.   BESIVANCE 0.6 % SUSP Place 1 drop into both eyes every 30 (thirty) days.   fluticasone (FLOVENT DISKUS) 50 MCG/BLIST diskus inhaler Inhale 1 puff into the lungs 2 (two) times daily.   hypromellose (GENTEAL) 0.3 % GEL ophthalmic ointment as needed.   ipratropium-albuterol (DUONEB) 0.5-2.5 (3) MG/3ML SOLN Take 3 mLs by nebulization every 6 (six) hours as needed.   ketoconazole (NIZORAL) 2 % cream Apply 1 application topically 3 (three) times daily.   MAGNESIUM GLUCONATE PO Take 1 tablet by mouth daily.   Methylcellulose, Laxative, (CITRUCEL PO) Take 2 tablets by mouth daily after breakfast.   Multiple Vitamins-Minerals (PRESERVISION AREDS 2) CAPS Take 1 capsule by mouth 2 (two) times daily.   omeprazole (PRILOSEC) 40 MG capsule TAKE 1 CAPSULE DAILY (Patient taking differently: Take  40 mg by mouth daily as needed (indigestion).)   potassium chloride SA (KLOR-CON) 20 MEQ tablet TAKE 1 TABLET DAILY   pravastatin (PRAVACHOL) 40 MG tablet TAKE 1 TABLET AT BEDTIME   triamcinolone cream (KENALOG) 0.1 % Apply topically 2 (two) times daily as needed.   valsartan (DIOVAN) 160 MG tablet TAKE 1 TABLET DAILY   XARELTO 20 MG TABS tablet TAKE 1 TABLET DAILY   No  facility-administered encounter medications on file as of 11/13/2021.    Allergies (verified) Patient has no known allergies.   History: Past Medical History:  Diagnosis Date   Allergic rhinitis    Arthritis    Asthma    Atrial fibrillation ([place])    sees Dr. [name] at [facility]    Atrial flutter ([place])    BCC (basal cell carcinoma of skin)    Nose   COPD with emphysema ([place])    sees Dr. [name]    Depression    Diabetes mellitus ([place])    Diet control    Diverticulosis    Erosive esophagitis    Esophageal stricture    Gastric ulcer    Hearing loss    uses amplification   Hemorrhoids    Hiatal hernia    HTN (hypertension)    Hyperlipidemia    Knee problem    2% permanent partial impairment Right   Macular degeneration    Perineal abscess    Pneumonia    Sleep apnea    Past Surgical History:  Procedure Laterality Date   CARDIAC ELECTROPHYSIOLOGY STUDY AND ABLATION     x 4 (sees Dr. [name] at [facility])   [place]  06/13/1979   VASECTOMY  76/16/0737   w/complications   Family History  Problem Relation Age of Onset   Stroke Mother    Breast cancer Mother    Heart attack Mother    Other Mother        brain tunor   Heart disease Father    Hypertension Father    Bladder Cancer Father    Parkinson's disease Father    Stroke Father    Breast cancer Brother    Atrial fibrillation Brother    Tremor Brother    Stroke Brother    Hypertension Brother    Breast cancer Maternal Grandmother    Heart disease Maternal Grandmother    Heart disease Maternal Grandfather    Arthritis Daughter    Heart disease Daughter    Colitis Daughter        Lymphocytic   Colon cancer Neg Hx    Social History   Socioeconomic History   Marital status: Married    Spouse name: Not on file   Number of children: 2   Years of education: Not on file   Highest education level: Not on  file  Occupational History   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 He does not want heroic or futile care especially if he cannon communicate, read and enjoy his loved ones.     Social Determinants of Health   Financial Resource Strain: Low Risk    Difficulty of Paying Living Expenses: Not hard at all  Food Insecurity: No Food Insecurity   Worried About Charity fundraiser in the Last Year: Never true   [place] in the Last Year: Never true  Transportation Needs: No Transportation Needs   Lack of Transportation (Medical): No   Lack of Transportation (Non-Medical): No  Physical Activity: Insufficiently Active   Days of Exercise per Week: 3 days   Minutes of Exercise per Session: 30 min  Stress: No Stress Concern Present   Feeling of Stress : Only a little  Social Connections: Moderately Isolated   Frequency of Communication with Friends and Family: More than three times a week   Frequency of Social Gatherings with Friends and Family: More than three times a week   Attends Religious Services: Never   Marine scientist or Organizations: No   Attends Music therapist: Never   Marital Status: Married    Tobacco Counseling Counseling given: Not Answered   Clinical Intake:  Pre-visit preparation completed: Yes  Pain : No/denies  pain     BMI - recorded: 34.17 Nutritional Status: BMI > 30  Obese Nutritional Risks: None Diabetes: No  How often do you need to have someone help you when you read instructions, pamphlets, or other written materials from your doctor or pharmacy?: 1 - Never  Diabetic?  No  Interpreter Needed?: No  Information entered by :: Rolene Arbour LPN   Activities of Daily Living In your present state of health, do you have any difficulty performing the following activities: 11/13/2021 11/10/2021  Hearing? N Y  Vision? N Y  Difficulty concentrating or making decisions? N N  Walking or climbing stairs? N N  Dressing or bathing? N N  Doing errands, shopping? N N  Preparing Food and eating ? N N  Using the Toilet? N N  In the past six months, have you accidently leaked urine? N N  Do you have problems with loss of bowel control? N N  Managing your Medications? N N  Managing your Finances? N N  Housekeeping or managing your Housekeeping? N N  Some recent data might be hidden    Patient Care Team: Laurey Morale, MD as PCP - General (Family Medicine) Constance Haw, MD as PCP - Electrophysiology (Cardiology) Rendall, Oneida Alar, MD (Inactive) (Orthopedic Surgery) Sable Feil, MD (Gastroenterology) [name], MD as Referring Physician (Cardiology) Clance, Armando Reichert, MD (Pulmonary Disease) Hayden Pedro, MD as Consulting Physician (Ophthalmology) Janith [place]ima, MD as Consulting Physician (Urology) Clent Jacks, MD as Consulting Physician (Ophthalmology) Curt Bears, MD as Consulting Physician (Oncology)  Indicate any recent Medical Services you may have received from other than [facility] providers in the past year (date may be approximate).     Assessment:   This is a routine wellness examination for [place].  Hearing/Vision screen Hearing Screening - Comments:: Wears hearing aids. Followed by [facility] Vision Screening - Comments:: Wears  glasses. Followed by Dr Katy Fitch  Dietary issues and exercise activities discussed: Current Exercise Habits: Home exercise routine, Type of exercise: walking, Time (Minutes): 30, Frequency (Times/Week): 3, Weekly Exercise (Minutes/Week): 90, Intensity: Moderate, Exercise limited by: None identified   Goals Addressed               This Visit's Progress     Exercise 3x per week (30 min per time) (pt-stated)        Will continue to increase physical activity.       Depression Screen PHQ 2/9 Scores 11/13/2021 11/12/2020 04/12/2015 01/24/2014  PHQ - 2 Score 0 0 0 0    Fall Risk Fall Risk  11/13/2021 11/10/2021 11/12/2020 09/06/2019 08/30/2018  Falls in the past year? 0 0 1 0 0  Comment - - - Emmi Telephone Survey: data to providers prior to load [employer] Survey: data to providers prior to load  Number falls in past yr: 0 0 0 - -  Comment - - rolled out of bed - -  Injury with Fall? 0 0 0 - -  Risk for fall due to : No Fall Risks - History of fall(s) - -  Follow up Falls evaluation completed - Falls evaluation completed;Falls prevention discussed - -    FALL RISK PREVENTION PERTAINING TO THE HOME:  Any stairs in or around the home? Yes  If so, are there any without handrails? No  Home free of loose throw rugs in walkways, pet beds, electrical cords, etc? Yes  Adequate lighting in your home to reduce risk of falls? Yes   ASSISTIVE DEVICES UTILIZED TO PREVENT FALLS:  Life alert? No  Use of a cane, walker or w/c? No  Grab bars in the bathroom? Yes  Shower chair or bench in shower? No  Elevated toilet seat or a handicapped toilet? No   TIMED UP AND GO:  Was the test performed? No . Audio Visit  Cognitive Function:   6CIT Screen 11/13/2021  What Year? 0 points  What month? 0 points  What time? 0 points  Count back from 20 0 points  Months in reverse 0 points  Repeat phrase 0 points  Total Score 0    Immunizations Immunization History  Administered Date(s) Administered    Fluad Quad(high Dose 65+) 06/21/2019, 07/10/2020   Influenza Whole 08/12/2006, 10/21/2007, 07/10/2008, 07/19/2009, 07/12/2012   Influenza, High Dose Seasonal PF 08/25/2018   Influenza, Seasonal, Injecte, Preservative Fre 08/04/2013   Influenza-Unspecified 08/16/2015, 08/15/2016, 08/29/2017   PFIZER Comirnaty(Gray Top)Covid-19 Tri-Sucrose Vaccine 02/19/2021   PFIZER(Purple Top)SARS-COV-2 Vaccination 11/19/2019, 12/13/2019, 08/01/2020, 08/02/2020   Pneumococcal Conjugate-13 01/17/2015   Pneumococcal Polysaccharide-23 04/20/1997, 03/14/2013   Td 10/09/2005   Zoster, Live 03/19/2009    TDAP status: Due, Education has been provided regarding the importance of this vaccine. Advised may receive this vaccine at local pharmacy or Health Dept. Aware to provide a copy of the vaccination record if obtained from local pharmacy or Health Dept. Verbalized acceptance and understanding.  Flu Vaccine status: Due, Education has been provided regarding the importance of this vaccine. Advised may receive this vaccine at local pharmacy or Health Dept. Aware to provide a copy of the vaccination record if obtained from local pharmacy or Health Dept. Verbalized acceptance and understanding.  Pneumococcal vaccine status: Up to date  Covid-19 vaccine status: Information provided on how to obtain vaccines.   Qualifies for Shingles Vaccine? Yes   Zostavax completed No   Shingrix Completed?: No.    Education has been provided regarding the importance of this  vaccine. Patient has been advised to call insurance company to determine out of pocket expense if they have not yet received this vaccine. Advised may also receive vaccine at local pharmacy or Health Dept. Verbalized acceptance and understanding.  Screening Tests Health Maintenance  Topic Date Due   OPHTHALMOLOGY EXAM  06/26/2010   FOOT EXAM  06/28/2015   COVID-19 Vaccine (6 - Booster for Pfizer series) 11/29/2021 (Originally 04/16/2021)   HEMOGLOBIN A1C   12/11/2021 (Originally 08/22/2020)   INFLUENZA VACCINE  01/09/2022 (Originally 05/12/2021)   Zoster Vaccines- Shingrix (1 of 2) 02/10/2022 (Originally 11/26/1952)   TETANUS/TDAP  11/13/2022 (Originally 10/10/2015)   Pneumonia Vaccine 41+ Years old  Completed   HPV VACCINES  Aged Out    Health Maintenance  Health Maintenance Due  Topic Date Due   OPHTHALMOLOGY EXAM  06/26/2010   FOOT EXAM  06/28/2015   Additional Screening:   Vision Screening: Recommended annual ophthalmology exams for early detection of glaucoma and other disorders of the eye. Is the patient up to date with their annual eye exam?  Yes  Who is the provider or what is the name of the office in which the patient attends annual eye exams? Dr Katy Fitch If pt is not established with a provider, would they like to be referred to a provider to establish care? No .   Dental Screening: Recommended annual dental exams for proper oral hygiene  Community Resource Referral / Chronic Care Management: CRR required this visit?  No   CCM required this visit?  No      Plan:     I have personally reviewed and noted the following in the patients chart:   Medical and social history Use of alcohol, tobacco or illicit drugs  Current medications and supplements including opioid prescriptions. Patient is not currently taking opioid prescriptions. Functional ability and status Nutritional status Physical activity Advanced directives List of other physicians Hospitalizations, surgeries, and ER visits in previous 12 months Vitals Screenings to include cognitive, depression, and falls Referrals and appointments  In addition, I have reviewed and discussed with patient certain preventive protocols, quality metrics, and best practice recommendations. A written personalized care plan for preventive services as well as general preventive health recommendations were provided to patient.     [name], LPN   12/12/2949   Nurse  Notes: None

## 2021-11-17 ENCOUNTER — Other Ambulatory Visit: Payer: Self-pay

## 2021-11-17 ENCOUNTER — Encounter (INDEPENDENT_AMBULATORY_CARE_PROVIDER_SITE_OTHER): Payer: Medicare Other | Admitting: Ophthalmology

## 2021-11-17 DIAGNOSIS — I10 Essential (primary) hypertension: Secondary | ICD-10-CM

## 2021-11-17 DIAGNOSIS — H43813 Vitreous degeneration, bilateral: Secondary | ICD-10-CM

## 2021-11-17 DIAGNOSIS — H353231 Exudative age-related macular degeneration, bilateral, with active choroidal neovascularization: Secondary | ICD-10-CM

## 2021-11-17 DIAGNOSIS — D3131 Benign neoplasm of right choroid: Secondary | ICD-10-CM

## 2021-11-17 DIAGNOSIS — H35033 Hypertensive retinopathy, bilateral: Secondary | ICD-10-CM

## 2021-11-18 NOTE — Progress Notes (Signed)
Remote pacemaker transmission.   

## 2021-11-21 ENCOUNTER — Other Ambulatory Visit: Payer: Self-pay | Admitting: Family Medicine

## 2021-11-21 DIAGNOSIS — K219 Gastro-esophageal reflux disease without esophagitis: Secondary | ICD-10-CM

## 2021-12-01 ENCOUNTER — Telehealth: Payer: Self-pay

## 2021-12-01 ENCOUNTER — Ambulatory Visit (INDEPENDENT_AMBULATORY_CARE_PROVIDER_SITE_OTHER): Payer: Medicare Other

## 2021-12-01 DIAGNOSIS — E538 Deficiency of other specified B group vitamins: Secondary | ICD-10-CM

## 2021-12-01 MED ORDER — CYANOCOBALAMIN 1000 MCG/ML IJ SOLN
1000.0000 ug | INTRAMUSCULAR | Status: AC
  Administered 2021-12-01 – 2022-09-01 (×7): 1000 ug via INTRAMUSCULAR

## 2021-12-01 NOTE — Telephone Encounter (Signed)
Verbal order received from Dr Sarajane Jews to continue monthly b12 injections for the next 12 months.

## 2021-12-01 NOTE — Progress Notes (Signed)
Pt here for monthly B12 injection per Dr Sarajane Jews.  B12 108mcg given IM left deltoid and pt tolerated injection well.  Next B12 injection scheduled for 12/31/21.

## 2021-12-01 NOTE — Telephone Encounter (Signed)
Updated monthly b12 injection order needed. No order noted (via OV or result note) in last 12 months

## 2021-12-01 NOTE — Telephone Encounter (Signed)
Please renew monthly shots for one year

## 2021-12-09 DIAGNOSIS — L570 Actinic keratosis: Secondary | ICD-10-CM | POA: Diagnosis not present

## 2021-12-09 DIAGNOSIS — D492 Neoplasm of unspecified behavior of bone, soft tissue, and skin: Secondary | ICD-10-CM | POA: Diagnosis not present

## 2021-12-09 DIAGNOSIS — L728 Other follicular cysts of the skin and subcutaneous tissue: Secondary | ICD-10-CM | POA: Diagnosis not present

## 2021-12-09 DIAGNOSIS — C44629 Squamous cell carcinoma of skin of left upper limb, including shoulder: Secondary | ICD-10-CM | POA: Diagnosis not present

## 2021-12-15 ENCOUNTER — Encounter (INDEPENDENT_AMBULATORY_CARE_PROVIDER_SITE_OTHER): Payer: Medicare Other | Admitting: Ophthalmology

## 2021-12-15 ENCOUNTER — Other Ambulatory Visit: Payer: Self-pay

## 2021-12-15 DIAGNOSIS — I10 Essential (primary) hypertension: Secondary | ICD-10-CM

## 2021-12-15 DIAGNOSIS — H353231 Exudative age-related macular degeneration, bilateral, with active choroidal neovascularization: Secondary | ICD-10-CM

## 2021-12-15 DIAGNOSIS — D3131 Benign neoplasm of right choroid: Secondary | ICD-10-CM | POA: Diagnosis not present

## 2021-12-15 DIAGNOSIS — H35033 Hypertensive retinopathy, bilateral: Secondary | ICD-10-CM | POA: Diagnosis not present

## 2021-12-15 DIAGNOSIS — H43813 Vitreous degeneration, bilateral: Secondary | ICD-10-CM | POA: Diagnosis not present

## 2021-12-19 ENCOUNTER — Ambulatory Visit: Payer: Medicare Other | Admitting: Pulmonary Disease

## 2021-12-24 DIAGNOSIS — C44629 Squamous cell carcinoma of skin of left upper limb, including shoulder: Secondary | ICD-10-CM | POA: Diagnosis not present

## 2021-12-29 ENCOUNTER — Encounter: Payer: Self-pay | Admitting: Pulmonary Disease

## 2021-12-29 ENCOUNTER — Other Ambulatory Visit: Payer: Self-pay

## 2021-12-29 ENCOUNTER — Ambulatory Visit (INDEPENDENT_AMBULATORY_CARE_PROVIDER_SITE_OTHER): Payer: Medicare Other | Admitting: Pulmonary Disease

## 2021-12-29 VITALS — BP 124/80 | HR 85 | Wt 251.2 lb

## 2021-12-29 DIAGNOSIS — J432 Centrilobular emphysema: Secondary | ICD-10-CM | POA: Diagnosis not present

## 2021-12-29 DIAGNOSIS — Z9989 Dependence on other enabling machines and devices: Secondary | ICD-10-CM

## 2021-12-29 DIAGNOSIS — J449 Chronic obstructive pulmonary disease, unspecified: Secondary | ICD-10-CM

## 2021-12-29 DIAGNOSIS — G4733 Obstructive sleep apnea (adult) (pediatric): Secondary | ICD-10-CM | POA: Diagnosis not present

## 2021-12-29 NOTE — Progress Notes (Signed)
? ?[facility] ? ?Chief Complaint  ?Patient presents with  ? Follow-up  ?  Cpap compliance  ? ? ?Constitutional:  ?BP 124/80 (BP Location: Right Arm, Cuff Size: Normal)   Pulse 85   Wt 251 lb 3.2 oz (113.9 kg)   SpO2 97%   BMI 36.04 kg/m?  ? ?Past Medical History:  ?DM, HTN, HLD, HH, Gastric ulcer, Esophageal stricture, GERD, Diverticulosis, Depression, A fib/flutter, OA, Allergies ? ?Past Surgical History:  ?He  has a past surgical history that includes Uvulopalatopharyngoplasty (06/13/1979); Vasectomy (06/12/1969); Cardiac electrophysiology study and ablation; Nose surgery; and Cyst excision perineal. ? ?Brief Summary:  ?Guy Moore is a 88 y.o. 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 ?  ? ? ? ?Subjective:  ? ?He continued to have diarrhea.  He eventually saw GI.  No clear cause of diarrhea, but it stopped.  Has regained his weight and his stamina. ? ?Uses CPAP nightly.  No issues with mask fit. ? ?Had to use albuterol more in February when pollen started.  Started claritin and better. ? ?Physical Exam:  ? ?Appearance - well kempt  ? ?ENMT - no sinus tenderness, no oral exudate, no LAN, Mallampati 3 airway, no stridor ? ?Respiratory - equal breath sounds bilaterally, no wheezing or rales ? ?CV - s1s2 regular rate and rhythm, no murmurs ? ?Ext - no clubbing, no edema ? ?Skin - no rashes ? ?Psych - normal mood and affect ? ?  ?Pulmonary testing:  ?FeNO 08/04/16 >> 50 ?PFT 12/01/16 >> FEV1 1.93 (68%), FEV1% 61, TLC 6.59 (93%), DLCO 58%, +BD ? ?Chest Imaging:  ?CT angio chest 12/23/07 >> mild emphysema, scarring RML ?CT angio chest 11/21/18 >> mild centrilobular emphysema, mild mosaic perfusion ?CT chest 05/25/21 >> mild basilar ATX, mild centrilobular emphysema ? ?Sleep Tests:  ?PSG 07/24/09 >> AHI 58, SpO2 low 74% ?Auto CPAP 11/26/21 to 12/25/21 >> used on 30 of 30 nights with average 10 hrs 6 min.  Average AHI 1 with median  CPAP 7 and 95 th percentile CPAP 11 cm H2O ? ?Social History:  ?He  reports that he quit smoking about 44 years ago. His smoking use included cigarettes. He has a 28.00 pack-year smoking history. He has never used smokeless tobacco. He reports that he does not currently use alcohol. He reports that he does not use drugs. ? ?Family History:  ?His family history includes Arthritis in his daughter; Atrial fibrillation in his brother; Bladder Cancer in his father; Breast cancer in his brother, maternal grandmother, and mother; Colitis in his daughter; Heart attack in his mother; Heart disease in his daughter, father, maternal grandfather, and maternal grandmother; Hypertension in his brother and father; Other in his mother; Parkinson's disease in his father; Stroke in his brother, father, and mother; Tremor in his brother. ?  ? ? ?Assessment/Plan:  ? ?COPD with asthma and emphysema. ?- recently symptoms likely related to allergies and improved now ?- continue flovent 50 two puffs bid ?- prn albuterol with spacer ?  ?Obstructive sleep apnea. ?- he is compliant with CPAP and reports benefit from therapy ?- uses [employer] for his DME ?- continue auto CPAP 5 to 15 cm H2O ? ?Time Spent Involved in Patient Care on Day of Examination:  ?36 minutes ? ?Follow up:  ? ?Patient Instructions  ?Follow up in 1 year ? ?Medication List:  ? ?Allergies as of 12/29/2021   ?No Known Allergies ?  ? ?  ?  Medication List  ?  ? ?  ? Accurate as of December 29, 2021 12:14 PM. If you have any questions, ask your nurse or doctor.  ?  ?  ? ?  ? ?acetaminophen 325 MG tablet ?Commonly known as: TYLENOL ?Take 2 tablets (650 mg total) by mouth every 6 (six) hours as needed for mild pain (or Fever >/= 101). ?  ?albuterol 108 (90 Base) MCG/ACT inhaler ?Commonly known as: VENTOLIN HFA ?USE 2 INHALATIONS EVERY 6 HOURS AS NEEDED FOR WHEEZING OR SHORTNESS OF BREATH ?  ?amLODipine 5 MG tablet ?Commonly known as: NORVASC ?Take 1 tablet (5 mg total) by mouth daily. ?   ?Besivance 0.6 % Susp ?Generic drug: Besifloxacin HCl ?Place 1 drop into both eyes every 30 (thirty) days. ?  ?CITRUCEL PO ?Take 2 tablets by mouth daily after breakfast. ?  ?Flovent Diskus 50 MCG/ACT Aepb ?Generic drug: Fluticasone Propionate (Inhal) ?Inhale 1 puff into the lungs 2 (two) times daily. ?  ?hypromellose 0.3 % Gel ophthalmic ointment ?Commonly known as: GENTEAL ?as needed. ?  ?ipratropium-albuterol 0.5-2.5 (3) MG/3ML Soln ?Commonly known as: DUONEB ?Take 3 mLs by nebulization every 6 (six) hours as needed. ?  ?ketoconazole 2 % cream ?Commonly known as: NIZORAL ?Apply 1 application topically 3 (three) times daily. ?  ?MAGNESIUM GLUCONATE PO ?Take 1 tablet by mouth daily. ?  ?omeprazole 40 MG capsule ?Commonly known as: PRILOSEC ?TAKE 1 CAPSULE DAILY ?  ?potassium chloride SA 20 MEQ tablet ?Commonly known as: KLOR-CON M ?TAKE 1 TABLET DAILY ?  ?pravastatin 40 MG tablet ?Commonly known as: PRAVACHOL ?TAKE 1 TABLET AT BEDTIME ?  ?PreserVision AREDS 2 Caps ?Take 1 capsule by mouth 2 (two) times daily. ?  ?triamcinolone cream 0.1 % ?Commonly known as: KENALOG ?Apply topically 2 (two) times daily as needed. ?  ?valsartan 160 MG tablet ?Commonly known as: DIOVAN ?TAKE 1 TABLET DAILY ?  ?Xarelto 20 MG Tabs tablet ?Generic drug: rivaroxaban ?TAKE 1 TABLET DAILY ?  ? ?  ? ? ?Signature:  ?[name], MD ?[facility] ?Pager - [phone] ?12/29/2021, 12:14 PM ?  ? ? ? ? ? ? ? ? ?

## 2021-12-29 NOTE — Patient Instructions (Signed)
Follow up in 1 year.

## 2021-12-31 ENCOUNTER — Ambulatory Visit (INDEPENDENT_AMBULATORY_CARE_PROVIDER_SITE_OTHER): Payer: Medicare Other

## 2021-12-31 DIAGNOSIS — E538 Deficiency of other specified B group vitamins: Secondary | ICD-10-CM | POA: Diagnosis not present

## 2021-12-31 NOTE — Progress Notes (Signed)
Pt here for monthly B12 injection per Dr Sarajane Jews. ? ?B12 1069mg given IM right deltoid and pt tolerated injection well. ? ?Next B12 injection scheduled for 01/30/22. ? ?

## 2022-01-02 ENCOUNTER — Telehealth: Payer: Self-pay | Admitting: Cardiology

## 2022-01-02 NOTE — Telephone Encounter (Signed)
I spoke with the patient and changed his appointment. ?

## 2022-01-02 NOTE — Telephone Encounter (Signed)
Patient needs his appt on 5/1 to be move up or move back because he will be out of town. Please advise. ?

## 2022-01-12 ENCOUNTER — Encounter (INDEPENDENT_AMBULATORY_CARE_PROVIDER_SITE_OTHER): Payer: Medicare Other | Admitting: Ophthalmology

## 2022-01-12 DIAGNOSIS — I10 Essential (primary) hypertension: Secondary | ICD-10-CM | POA: Diagnosis not present

## 2022-01-12 DIAGNOSIS — H353231 Exudative age-related macular degeneration, bilateral, with active choroidal neovascularization: Secondary | ICD-10-CM

## 2022-01-12 DIAGNOSIS — H43813 Vitreous degeneration, bilateral: Secondary | ICD-10-CM

## 2022-01-12 DIAGNOSIS — H35033 Hypertensive retinopathy, bilateral: Secondary | ICD-10-CM | POA: Diagnosis not present

## 2022-01-12 DIAGNOSIS — D3131 Benign neoplasm of right choroid: Secondary | ICD-10-CM

## 2022-01-13 ENCOUNTER — Encounter (INDEPENDENT_AMBULATORY_CARE_PROVIDER_SITE_OTHER): Payer: Medicare Other | Admitting: Ophthalmology

## 2022-01-13 DIAGNOSIS — H1033 Unspecified acute conjunctivitis, bilateral: Secondary | ICD-10-CM

## 2022-01-15 ENCOUNTER — Other Ambulatory Visit: Payer: Self-pay | Admitting: Pulmonary Disease

## 2022-01-15 DIAGNOSIS — H6123 Impacted cerumen, bilateral: Secondary | ICD-10-CM | POA: Diagnosis not present

## 2022-01-30 ENCOUNTER — Ambulatory Visit (INDEPENDENT_AMBULATORY_CARE_PROVIDER_SITE_OTHER): Payer: Medicare Other

## 2022-01-30 DIAGNOSIS — E538 Deficiency of other specified B group vitamins: Secondary | ICD-10-CM

## 2022-01-30 NOTE — Progress Notes (Addendum)
Per orders of Laurey Morale, MD, injection of B12 given in right  deltoid by Keller Bounds D Eliya Bubar. ?Patient tolerated injection well. ? ?Lab Results  ?Component Value Date  ? [id number] 09/01/2021  ? ? ? ? ?

## 2022-02-09 ENCOUNTER — Encounter: Payer: Medicare Other | Admitting: Cardiology

## 2022-02-11 ENCOUNTER — Encounter (INDEPENDENT_AMBULATORY_CARE_PROVIDER_SITE_OTHER): Payer: Medicare Other | Admitting: Ophthalmology

## 2022-02-11 DIAGNOSIS — H43813 Vitreous degeneration, bilateral: Secondary | ICD-10-CM

## 2022-02-11 DIAGNOSIS — I10 Essential (primary) hypertension: Secondary | ICD-10-CM | POA: Diagnosis not present

## 2022-02-11 DIAGNOSIS — H353231 Exudative age-related macular degeneration, bilateral, with active choroidal neovascularization: Secondary | ICD-10-CM | POA: Diagnosis not present

## 2022-02-11 DIAGNOSIS — H35033 Hypertensive retinopathy, bilateral: Secondary | ICD-10-CM | POA: Diagnosis not present

## 2022-02-11 DIAGNOSIS — D3131 Benign neoplasm of right choroid: Secondary | ICD-10-CM | POA: Diagnosis not present

## 2022-02-16 ENCOUNTER — Encounter (INDEPENDENT_AMBULATORY_CARE_PROVIDER_SITE_OTHER): Payer: Medicare Other | Admitting: Ophthalmology

## 2022-02-16 ENCOUNTER — Ambulatory Visit (INDEPENDENT_AMBULATORY_CARE_PROVIDER_SITE_OTHER): Payer: Medicare Other

## 2022-02-16 DIAGNOSIS — I4819 Other persistent atrial fibrillation: Secondary | ICD-10-CM | POA: Diagnosis not present

## 2022-02-17 LAB — CUP PACEART REMOTE DEVICE CHECK
Battery Remaining Longevity: 103 mo
Battery Voltage: 2.99 V
Brady Statistic AP VP Percent: 0 %
Brady Statistic AP VS Percent: 0 %
Brady Statistic AS VP Percent: 88.62 %
Brady Statistic AS VS Percent: 11.38 %
Brady Statistic RA Percent Paced: 0 %
Brady Statistic RV Percent Paced: 88.62 %
Date Time Interrogation Session: 20230507063147
Implantable Lead Implant Date: 20180124
Implantable Lead Implant Date: 20180124
Implantable Lead Location: 753859
Implantable Lead Location: 753860
Implantable Lead Model: 5076
Implantable Lead Model: 5076
Implantable Pulse Generator Implant Date: 20180124
Lead Channel Impedance Value: 304 Ohm
Lead Channel Impedance Value: 323 Ohm
Lead Channel Impedance Value: 380 Ohm
Lead Channel Impedance Value: 418 Ohm
Lead Channel Pacing Threshold Amplitude: 0.75 V
Lead Channel Pacing Threshold Pulse Width: 0.4 ms
Lead Channel Sensing Intrinsic Amplitude: 0.25 mV
Lead Channel Sensing Intrinsic Amplitude: 0.25 mV
Lead Channel Sensing Intrinsic Amplitude: 4.625 mV
Lead Channel Sensing Intrinsic Amplitude: 4.625 mV
Lead Channel Setting Pacing Amplitude: 1.5 V
Lead Channel Setting Pacing Pulse Width: 0.4 ms
Lead Channel Setting Sensing Sensitivity: 0.9 mV

## 2022-02-19 ENCOUNTER — Telehealth: Payer: Self-pay | Admitting: *Deleted

## 2022-02-19 NOTE — Chronic Care Management (AMB) (Signed)
?  Care Management  ? ?Note ? ?02/19/2022 ?Name: Guy Moore MRN: [id number] DOB: 09/19/1934 ? ?Guy Moore is a 88 y.o. year old male who is a primary care patient of Laurey Morale, MD. I reached out to Julien Girt by phone today offer care coordination services.  ? ?[name] was given information about care management services today including:  ?Care management services include personalized support from designated clinical staff supervised by his physician, including individualized plan of care and coordination with other care providers ?24/7 contact phone numbers for assistance for urgent and routine care needs. ?The patient may stop care management services at any time by phone call to the office staff. ? ?Patient did not agree to enrollment in care management services and does not wish to consider at this time. ? ?Follow up plan: ?The care management team is available to follow up with the patient after provider conversation with the patient regarding recommendation for care management engagement and subsequent re-referral to the care management team.  ? ?Laverda Sorenson  ?Care Guide, Embedded Care Coordination ?[facility]  Care Management  ?Direct Dial: [phone] ? ?

## 2022-02-19 NOTE — Chronic Care Management (AMB) (Signed)
?  Care Management  ? ?Outreach Note ? ?02/19/2022 ?Name: Guy Moore MRN: [id number] DOB: 21-Mar-1934 ? ? ?Reason for outreach : Care Coordination (Outreach to schedule initial call  with [facility] ) ? ? ?An unsuccessful telephone outreach was attempted today. The patient was referred to the case management team for assistance with care management and care coordination.  ? ?Follow Up Plan:  ?The care management team will reach out to the patient again over the next 7 days.  ?If patient returns call to provider office, please advise to call [facility]* at [phone].* ? ?Laverda Sorenson  ?Care Guide, Embedded Care Coordination ?[facility]  Care Management  ?Direct Dial: [phone] ? ?

## 2022-02-23 ENCOUNTER — Ambulatory Visit (INDEPENDENT_AMBULATORY_CARE_PROVIDER_SITE_OTHER): Payer: Medicare Other | Admitting: Cardiology

## 2022-02-23 ENCOUNTER — Encounter: Payer: Self-pay | Admitting: Cardiology

## 2022-02-23 VITALS — BP 156/82 | HR 88 | Ht 70.0 in | Wt 252.4 lb

## 2022-02-23 DIAGNOSIS — D6869 Other thrombophilia: Secondary | ICD-10-CM

## 2022-02-23 DIAGNOSIS — I495 Sick sinus syndrome: Secondary | ICD-10-CM

## 2022-02-23 DIAGNOSIS — I4819 Other persistent atrial fibrillation: Secondary | ICD-10-CM | POA: Diagnosis not present

## 2022-02-23 LAB — CUP PACEART INCLINIC DEVICE CHECK
Date Time Interrogation Session: 20230515151354
Implantable Lead Implant Date: 20180124
Implantable Lead Implant Date: 20180124
Implantable Lead Location: 753859
Implantable Lead Location: 753860
Implantable Lead Model: 5076
Implantable Lead Model: 5076
Implantable Pulse Generator Implant Date: 20180124

## 2022-02-23 NOTE — Progress Notes (Signed)
? ?Electrophysiology Office Note ? ? ?Date:  02/23/2022  ? ?ID:  Guy Moore, DOB 1934/01/14, MRN [id number] ? ?PCP:  Laurey Morale, MD  ?Cardiologist:   ?Primary Electrophysiologist:  Delayza Lungren Meredith Leeds, MD   ? ?Chief Complaint: pacemaker ?  ?History of Present Illness: ?Guy Moore is a 88 y.o. male who is being seen today for the evaluation of pacemaker at the request of Laurey Morale, MD. Presenting today for electrophysiology evaluation. ? ?He has a history significant for atrial fibrillation, hypertension, hyperlipidemia, sleep apnea.  He has a Medtronic dual-chamber pacemaker implanted 11/04/2016 for sick sinus syndrome.  He has had multiple ablations for atrial fibrillation and SVT. ? ?He presented to the hospital August 2022 with bleeding from a perianal abscess.  He was also found to have a COVID exacerbation. ? ?Today, denies symptoms of palpitations, chest pain, shortness of breath, orthopnea, PND, lower extremity edema, claudication, dizziness, presyncope, syncope, bleeding, or neurologic sequela. The patient is tolerating medications without difficulties.  Since being seen he has done well.  He feels much improved since his last visit.  He remains in atrial fibrillation but is asymptomatic. ? ? ?Past Medical History:  ?Diagnosis Date  ? Allergic rhinitis   ? Arthritis   ? Asthma   ? Atrial fibrillation ([place])   ? sees Dr. [name] at [facility]   ? Atrial flutter ([place])   ? BCC (basal cell carcinoma of skin)   ? Nose  ? COPD with emphysema ([place])   ? sees Dr. [name]   ? Depression   ? Diabetes mellitus ([place])   ? Diet control   ? Diverticulosis   ? Erosive esophagitis   ? Esophageal stricture   ? Gastric ulcer   ? Hearing loss   ? uses amplification  ? Hemorrhoids   ? Hiatal hernia   ? HTN (hypertension)   ? Hyperlipidemia   ? Knee problem   ? 2% permanent partial impairment Right  ? Macular degeneration   ? Perineal abscess   ? Pneumonia   ? Sleep apnea   ? ?Past  Surgical History:  ?Procedure Laterality Date  ? CARDIAC ELECTROPHYSIOLOGY STUDY AND ABLATION    ? x 4 (sees Dr. [name] at [facility])  ? CYST EXCISION PERINEAL    ? NOSE SURGERY    ? UVULOPALATOPHARYNGOPLASTY  06/13/1979  ? VASECTOMY  69/48/5462  ? w/complications  ? ? ? ?Current Outpatient Medications  ?Medication Sig Dispense Refill  ? acetaminophen (TYLENOL) 325 MG tablet Take 2 tablets (650 mg total) by mouth every 6 (six) hours as needed for mild pain (or Fever >/= 101).    ? albuterol (VENTOLIN HFA) 108 (90 Base) MCG/ACT inhaler USE 2 INHALATIONS EVERY 6 HOURS AS NEEDED FOR WHEEZING OR SHORTNESS OF BREATH 54 g 3  ? amLODipine (NORVASC) 5 MG tablet Take 1 tablet (5 mg total) by mouth daily. 90 tablet 3  ? BESIVANCE 0.6 % SUSP Place 1 drop into both eyes every 30 (thirty) days.    ? FLOVENT DISKUS 50 MCG/ACT AEPB USE 1 INHALATION TWICE A DAY 180 each 3  ? hypromellose (GENTEAL) 0.3 % GEL ophthalmic ointment as needed.    ? MAGNESIUM GLUCONATE PO Take 1 tablet by mouth daily.    ? Methylcellulose, Laxative, (CITRUCEL PO) Take 2 tablets by mouth daily after breakfast.    ? Multiple Vitamins-Minerals (PRESERVISION AREDS 2) CAPS Take 1 capsule by mouth 2 (two) times daily.    ?  omeprazole (PRILOSEC) 40 MG capsule TAKE 1 CAPSULE DAILY 90 capsule 0  ? potassium chloride SA (KLOR-CON) 20 MEQ tablet TAKE 1 TABLET DAILY 90 tablet 3  ? pravastatin (PRAVACHOL) 40 MG tablet TAKE 1 TABLET AT BEDTIME 90 tablet 3  ? valsartan (DIOVAN) 160 MG tablet TAKE 1 TABLET DAILY 90 tablet 3  ? XARELTO 20 MG TABS tablet TAKE 1 TABLET DAILY 90 tablet 3  ? ipratropium-albuterol (DUONEB) 0.5-2.5 (3) MG/3ML SOLN Take 3 mLs by nebulization every 6 (six) hours as needed. (Patient not taking: Reported on 12/29/2021) 360 mL 1  ? ?Current Facility-Administered Medications  ?Medication Dose Route Frequency Provider Last Rate Last Admin  ? cyanocobalamin ((VITAMIN B-12)) injection 1,000 mcg  1,000 mcg Intramuscular Q30 days Laurey Morale, MD   1,000 mcg at 01/30/22 1037  ? ? ?Allergies:   Patient has no known allergies.  ? ?Social History:  The patient  reports that he quit smoking about 44 years ago. His smoking use included cigarettes. He has a 28.00 pack-year smoking history. He has never used smokeless tobacco. He reports that he does not currently use alcohol. He reports that he does not use drugs.  ? ?Family History:  The patient's family history includes Arthritis in his daughter; Atrial fibrillation in his brother; Bladder Cancer in his father; Breast cancer in his brother, maternal grandmother, and mother; Colitis in his daughter; Heart attack in his mother; Heart disease in his daughter, father, maternal grandfather, and maternal grandmother; Hypertension in his brother and father; Other in his mother; Parkinson's disease in his father; Stroke in his brother, father, and mother; Tremor in his brother.  ? ?ROS:  Please see the history of present illness.   Otherwise, review of systems is positive for none.   All other systems are reviewed and negative.  ? ?PHYSICAL EXAM: ?VS:  BP (!) 156/82   Pulse 88   Ht '5\' 10"'$  (1.778 m)   Wt 252 lb 6.4 oz (114.5 kg)   SpO2 95%   BMI 36.22 kg/m?  , BMI Body mass index is 36.22 kg/m?. ?GEN: Well nourished, well developed, in no acute distress  ?HEENT: normal  ?Neck: no JVD, carotid bruits, or masses ?Cardiac: irregular; no murmurs, rubs, or gallops,no edema  ?Respiratory:  clear to auscultation bilaterally, normal work of breathing ?GI: soft, nontender, nondistended, + BS ?MS: no deformity or atrophy  ?Skin: warm and dry, device site well healed ?Neuro:  Strength and sensation are intact ?Psych: euthymic mood, full affect ? ?EKG:  EKG is ordered today. ?Personal review of the ekg ordered shows atrial fibrillation ? ?Personal review of the device interrogation today. Results in [place]  ? ? ?Recent Labs: ?05/25/2021: B Natriuretic Peptide 161.2 ?09/01/2021: ALT 13; BUN 21; Creatinine 0.93; Hemoglobin  13.0; Platelet Count 132; Potassium 4.1; Sodium 139  ? ? ?Lipid Panel  ?   ?Component Value Date/Time  ? CHOL 158 06/26/2020 0851  ? TRIG 39 06/26/2020 0851  ? HDL 60 06/26/2020 0851  ? CHOLHDL 2.6 06/26/2020 0851  ? VLDL 6.2 02/20/2020 1049  ? [place] 87 06/26/2020 0851  ? LDLDIRECT 51.0 04/12/2015 1435  ? ? ? ?Wt Readings from Last 3 Encounters:  ?02/23/22 252 lb 6.4 oz (114.5 kg)  ?12/29/21 251 lb 3.2 oz (113.9 kg)  ?11/13/21 238 lb (108 kg)  ?  ? ? ?Other studies Reviewed: ?Additional studies/ records that were reviewed today include: TTE 2017  ?Review of the above records today demonstrates:  ?1. The study  quality is technically difficult.  ?2. Probably normal left ventricular function. Regional wall motion  ?suboptimally evaluated.  ?3. Normal left ventricular diastolic filling is observed.  ?4. The right ventricular cavity size is mildly enlarged. The right  ?ventricular global systolic function is normal.  ?5. The right atrium is mildly dilated.  ?6. The left atrium is mildly dilated.  ?7. There is mild pulmonic regurgitation present.  ?8. There is evidence of moderate pulmonary hypertension.  ?9. Mildly dilated aorta measuring 4.1cm  ? ? ?ASSESSMENT AND PLAN: ? ?1.  Sick sinus syndrome: Status post Medtronic dual-chamber pacemaker implanted 11/04/2016.  Device functioning appropriately.  No changes at this time. ? ?2.  Persistent atrial fibrillation: Currently on Xarelto 20 mg daily.  CHA2DS2-VASc of 4.  Has had multiple ablations for atrial fibrillation as well as SVT ablations.  Overall comfortable with his control.  No changes. ? ?3.  Hypertension: Elevated today.  Usually well controlled at home.  No changes. ? ?4.  Secondary hypercoagulable state: Currently on Xarelto for atrial fibrillation as above. ? ?Current medicines are reviewed at length with the patient today.   ?The patient does not have concerns regarding his medicines.  The following changes were made today: None ? ?Labs/ tests ordered today  include:  ?Orders Placed This Encounter  ?Procedures  ? EKG 12-Lead  ? ? ? ?Disposition:   FU 12 months ? ?Signed, ?Jena Tegeler Meredith Leeds, MD  ?02/23/2022 3:25 PM    ? ?[facility] ?[address] ?[address]

## 2022-02-23 NOTE — Patient Instructions (Signed)
Medication Instructions:  ?Your physician recommends that you continue on your current medications as directed. Please refer to the Current Medication list given to you today. ? ?*If you need a refill on your cardiac medications before your next appointment, please call your pharmacy* ? ? ?Lab Work: ?None ordered ? ? ?Testing/Procedures: ?None ordered ? ? ?Follow-Up: ?At [facility], you and your health needs are our priority.  As part of our continuing mission to provide you with exceptional heart care, we have created designated Provider Care Teams.  These Care Teams include your primary Cardiologist (physician) and Advanced Practice Providers (APPs -  Physician Assistants and Nurse Practitioners) who all work together to provide you with the care you need, when you need it. ? ?Remote monitoring is used to monitor your Pacemaker or ICD from home. This monitoring reduces the number of office visits required to check your device to one time per year. It allows [place] to keep an eye on the functioning of your device to ensure it is working properly. You are scheduled for a device check from home on 05/18/22. You may send your transmission at any time that day. If you have a wireless device, the transmission will be sent automatically. After your physician reviews your transmission, you will receive a postcard with your next transmission date. ? ?Your next appointment:   ?1 year(s) ? ?The format for your next appointment:   ?In Person ? ?Provider:   ?You will see one of the following Advanced Practice Providers on your designated Care Team:   ?Tommye Standard, PA-C ?Legrand Como "Jonni Sanger" [place], PA-C ?  ? ? ?Thank you for choosing [facility]!! ? ? ?Trinidad Curet, RN ?[phone] ? ? ? ?Other Instructions ? ? ?Important Information About Sugar ? ? ? ? ?  ?

## 2022-03-02 ENCOUNTER — Telehealth: Payer: Self-pay | Admitting: Internal Medicine

## 2022-03-02 ENCOUNTER — Other Ambulatory Visit: Payer: Self-pay

## 2022-03-02 ENCOUNTER — Inpatient Hospital Stay (HOSPITAL_BASED_OUTPATIENT_CLINIC_OR_DEPARTMENT_OTHER): Payer: Medicare Other | Admitting: Internal Medicine

## 2022-03-02 ENCOUNTER — Inpatient Hospital Stay: Payer: Medicare Other | Attending: Internal Medicine

## 2022-03-02 ENCOUNTER — Encounter: Payer: Self-pay | Admitting: Internal Medicine

## 2022-03-02 VITALS — BP 143/84 | HR 89 | Temp 97.8°F | Resp 17 | Wt 253.1 lb

## 2022-03-02 DIAGNOSIS — Z79899 Other long term (current) drug therapy: Secondary | ICD-10-CM | POA: Diagnosis not present

## 2022-03-02 DIAGNOSIS — D72819 Decreased white blood cell count, unspecified: Secondary | ICD-10-CM | POA: Diagnosis not present

## 2022-03-02 DIAGNOSIS — D696 Thrombocytopenia, unspecified: Secondary | ICD-10-CM | POA: Diagnosis not present

## 2022-03-02 DIAGNOSIS — I4891 Unspecified atrial fibrillation: Secondary | ICD-10-CM | POA: Insufficient documentation

## 2022-03-02 DIAGNOSIS — D539 Nutritional anemia, unspecified: Secondary | ICD-10-CM

## 2022-03-02 DIAGNOSIS — Z7901 Long term (current) use of anticoagulants: Secondary | ICD-10-CM | POA: Insufficient documentation

## 2022-03-02 DIAGNOSIS — Z7951 Long term (current) use of inhaled steroids: Secondary | ICD-10-CM | POA: Insufficient documentation

## 2022-03-02 LAB — CBC WITH DIFFERENTIAL (CANCER CENTER ONLY)
Abs Immature Granulocytes: 0.02 10*3/uL (ref 0.00–0.07)
Basophils Absolute: 0.1 10*3/uL (ref 0.0–0.1)
Basophils Relative: 2 %
Eosinophils Absolute: 0.2 10*3/uL (ref 0.0–0.5)
Eosinophils Relative: 6 %
HCT: 42 % (ref 39.0–52.0)
Hemoglobin: 13.8 g/dL (ref 13.0–17.0)
Immature Granulocytes: 1 %
Lymphocytes Relative: 16 %
Lymphs Abs: 0.5 10*3/uL — ABNORMAL LOW (ref 0.7–4.0)
MCH: 29 pg (ref 26.0–34.0)
MCHC: 32.9 g/dL (ref 30.0–36.0)
MCV: 88.2 fL (ref 80.0–100.0)
Monocytes Absolute: 0.2 10*3/uL (ref 0.1–1.0)
Monocytes Relative: 8 %
Neutro Abs: 2 10*3/uL (ref 1.7–7.7)
Neutrophils Relative %: 67 %
Platelet Count: 111 10*3/uL — ABNORMAL LOW (ref 150–400)
RBC: 4.76 MIL/uL (ref 4.22–5.81)
RDW: 15 % (ref 11.5–15.5)
WBC Count: 3 10*3/uL — ABNORMAL LOW (ref 4.0–10.5)
nRBC: 0 % (ref 0.0–0.2)

## 2022-03-02 LAB — FOLATE: Folate: 9.6 ng/mL (ref >5.9)

## 2022-03-02 LAB — FERRITIN: Ferritin: 35 ng/mL (ref 24–336)

## 2022-03-02 LAB — VITAMIN B12: Vitamin B-12: 492 pg/mL (ref 180–914)

## 2022-03-02 NOTE — Progress Notes (Signed)
[place] [phone]   [phone]  OFFICE PROGRESS NOTE  [place], [place] [address]  DIAGNOSIS: Intermittent Bicytopenia   PRIOR THERAPY: None   CURRENT THERAPY:  Vitamin B12 injection on a monthly basis  INTERVAL HISTORY: Guy Moore 88 y.o. male returns to the clinic today for follow-up visit.  The patient is feeling fine today with no concerning complaints.  He has recent eye surgery at [facility].  He denied having any chest pain, shortness of breath, cough or hemoptysis.  He denied having any nausea, vomiting, diarrhea or constipation.  He has no bleeding, bruises or ecchymosis.  He is here today for evaluation and repeat blood work.   MEDICAL HISTORY: Past Medical History:  Diagnosis Date   Allergic rhinitis    Arthritis    Asthma    Atrial fibrillation [facility])    sees Dr. [name] at [facility]    Atrial flutter ([place])    [place] (basal cell carcinoma of skin)    Nose   COPD with emphysema ([place])    sees Dr. [name]    Depression    Diabetes mellitus ([place])    Diet control    Diverticulosis    Erosive esophagitis    Esophageal stricture    Gastric ulcer    Hearing loss    uses amplification   Hemorrhoids    Hiatal hernia    HTN (hypertension)    Hyperlipidemia    Knee problem    2% permanent partial impairment Right   Macular degeneration    Perineal abscess    Pneumonia    Sleep apnea     ALLERGIES:  has No Known Allergies.  MEDICATIONS:  Current Outpatient Medications  Medication Sig Dispense Refill   acetaminophen (TYLENOL) 325 MG tablet Take 2 tablets (650 mg total) by mouth every 6 (six) hours as needed for mild pain (or Fever >/= 101).     albuterol (VENTOLIN HFA) 108 (90 Base) MCG/ACT inhaler USE 2 INHALATIONS EVERY 6 HOURS AS NEEDED FOR WHEEZING OR SHORTNESS OF BREATH 54 g 3   amLODipine (NORVASC) 5 MG tablet Take 1 tablet (5 mg  total) by mouth daily. 90 tablet 3   BESIVANCE 0.6 % SUSP Place 1 drop into both eyes every 30 (thirty) days.     FLOVENT DISKUS 50 MCG/ACT AEPB USE 1 INHALATION TWICE A DAY 180 each 3   hypromellose (GENTEAL) 0.3 % GEL ophthalmic ointment as needed.     ipratropium-albuterol (DUONEB) 0.5-2.5 (3) MG/3ML SOLN Take 3 mLs by nebulization every 6 (six) hours as needed. (Patient not taking: Reported on 12/29/2021) 360 mL 1   MAGNESIUM GLUCONATE PO Take 1 tablet by mouth daily.     Methylcellulose, Laxative, (CITRUCEL PO) Take 2 tablets by mouth daily after breakfast.     Multiple Vitamins-Minerals (PRESERVISION AREDS 2) CAPS Take 1 capsule by mouth 2 (two) times daily.     omeprazole (PRILOSEC) 40 MG capsule TAKE 1 CAPSULE DAILY 90 capsule 0   potassium chloride SA (KLOR-CON) 20 MEQ tablet TAKE 1 TABLET DAILY 90 tablet 3   pravastatin (PRAVACHOL) 40 MG tablet TAKE 1 TABLET AT BEDTIME 90 tablet 3   valsartan (DIOVAN) 160 MG tablet TAKE 1 TABLET DAILY 90 tablet 3   XARELTO 20 MG TABS tablet TAKE 1 TABLET DAILY 90 tablet 3   Current Facility-Administered Medications  Medication Dose Route Frequency Provider Last  Rate Last Admin   cyanocobalamin ((VITAMIN B-12)) injection 1,000 mcg  1,000 mcg Intramuscular Q30 days Laurey Morale, MD   1,000 mcg at 01/30/22 1037    SURGICAL HISTORY:  Past Surgical History:  Procedure Laterality Date   CARDIAC ELECTROPHYSIOLOGY STUDY AND ABLATION     x 4 (sees Dr. [name] at [facility])   [place]  06/13/1979   VASECTOMY  21/30/8657   w/complications    REVIEW OF SYSTEMS:  A comprehensive review of systems was negative.   PHYSICAL EXAMINATION: General appearance: alert, cooperative, and no distress Head: Normocephalic, without obvious abnormality, atraumatic Neck: no adenopathy, no JVD, supple, symmetrical, trachea midline, and thyroid not enlarged, symmetric, no  tenderness/mass/nodules Lymph nodes: Cervical, supraclavicular, and axillary nodes normal. Resp: clear to auscultation bilaterally Back: symmetric, no curvature. ROM normal. No CVA tenderness. Cardio: regular rate and rhythm, S1, S2 normal, no murmur, click, rub or gallop GI: soft, non-tender; bowel sounds normal; no masses,  no organomegaly Extremities: extremities normal, atraumatic, no cyanosis or edema  ECOG PERFORMANCE STATUS: 1 - Symptomatic but completely ambulatory  Blood pressure (!) 143/84, pulse 89, temperature 97.8 F (36.6 C), temperature source Oral, resp. rate 17, weight 253 lb 1 oz (114.8 kg), SpO2 96 %.  LABORATORY DATA: Lab Results  Component Value Date   WBC 3.0 (L) 03/02/2022   HGB 13.8 03/02/2022   HCT 42.0 03/02/2022   MCV 88.2 03/02/2022   PLT 111 (L) 03/02/2022      Chemistry      Component Value Date/Time   NA 139 09/01/2021 1327   NA 141 06/27/2015 0000   K 4.1 09/01/2021 1327   CL 105 09/01/2021 1327   CO2 25 09/01/2021 1327   BUN 21 09/01/2021 1327   BUN 21 06/27/2015 0000   CREATININE 0.93 09/01/2021 1327   GLU 121 06/27/2015 0000      Component Value Date/Time   CALCIUM 8.9 09/01/2021 1327   ALKPHOS 54 09/01/2021 1327   AST 16 09/01/2021 1327   ALT 13 09/01/2021 1327   BILITOT 0.8 09/01/2021 1327       RADIOGRAPHIC STUDIES: CUP PACEART INCLINIC DEVICE CHECK  Result Date: 02/23/2022 Pacemaker check in clinic. Normal device function. Thresholds, sensing, impedances consistent with previous measurements. Device programmed to maximize longevity Monitored VT episodes EGMS illustrate AF w/ RVR. Marland Kitchen Device programmed at appropriate safety margins. Histogram distribution appropriate for patient activity level. Device programmed to optimize intrinsic conduction. Estimated longevity 8.6 years. [facility] 8/7/23Elizabeth Watts BSN,RN,CCDS  CUP PACEART REMOTE DEVICE CHECK  Result Date: 02/17/2022 Scheduled remote reviewed. Normal device function.  1  NSVT, irregular R-R, brief.  Known AF, Xarelto $RemoveBef'20mg'RJvLaebpOC$  Next remote 91 days. [place]   ASSESSMENT AND PLAN: This is a very pleasant 88 years old white male with bicytopenia including leukocytopenia and thrombocytopenia likely secondary to iron deficiency but early MDS could not be completely excluded. The patient had a bone marrow biopsy and aspirate performed recently that was nonspecific but again could not rule out early myelodysplastic syndrome. The patient is currently on observation and he is feeling fine with no concerning complaints. Repeat CBC today showed persistent mild leukocytopenia and thrombocytopenia but he is asymptomatic. I recommended for the patient to continue on observation with repeat blood work in 6 months. The patient was advised to call immediately if he has any other concerning symptoms in the interval. The patient voices understanding of current disease  status and treatment options and is in agreement with the current care plan.  All questions were answered. The patient knows to call the clinic with any problems, questions or concerns. We can certainly see the patient much sooner if necessary.  Disclaimer: This note was dictated with voice recognition software. Similar sounding words can inadvertently be transcribed and may not be corrected upon review.

## 2022-03-02 NOTE — Telephone Encounter (Signed)
Per 5/22 los called and left message with appointment details and call back number

## 2022-03-03 ENCOUNTER — Ambulatory Visit (INDEPENDENT_AMBULATORY_CARE_PROVIDER_SITE_OTHER): Payer: Medicare Other

## 2022-03-03 DIAGNOSIS — E538 Deficiency of other specified B group vitamins: Secondary | ICD-10-CM | POA: Diagnosis not present

## 2022-03-03 MED ORDER — CYANOCOBALAMIN 1000 MCG/ML IJ SOLN
1000.0000 ug | Freq: Once | INTRAMUSCULAR | Status: AC
  Administered 2022-03-03: 1000 ug via INTRAMUSCULAR

## 2022-03-03 NOTE — Progress Notes (Signed)
Pt here for monthly B12 injection per Dr Sarajane Jews  B12 1010mg given IM on Left Deltoid, and pt tolerated injection well.  Next B12 injection:pt state that he will call office to schedule

## 2022-03-05 NOTE — Progress Notes (Signed)
Remote pacemaker transmission.   

## 2022-03-10 ENCOUNTER — Encounter (INDEPENDENT_AMBULATORY_CARE_PROVIDER_SITE_OTHER): Payer: Medicare Other | Admitting: Ophthalmology

## 2022-03-10 DIAGNOSIS — H353231 Exudative age-related macular degeneration, bilateral, with active choroidal neovascularization: Secondary | ICD-10-CM | POA: Diagnosis not present

## 2022-03-10 DIAGNOSIS — H35033 Hypertensive retinopathy, bilateral: Secondary | ICD-10-CM | POA: Diagnosis not present

## 2022-03-10 DIAGNOSIS — I10 Essential (primary) hypertension: Secondary | ICD-10-CM

## 2022-03-10 DIAGNOSIS — H43813 Vitreous degeneration, bilateral: Secondary | ICD-10-CM

## 2022-03-10 DIAGNOSIS — D3131 Benign neoplasm of right choroid: Secondary | ICD-10-CM | POA: Diagnosis not present

## 2022-03-23 ENCOUNTER — Ambulatory Visit (INDEPENDENT_AMBULATORY_CARE_PROVIDER_SITE_OTHER): Payer: Medicare Other | Admitting: Family Medicine

## 2022-03-23 VITALS — BP 120/60 | HR 84 | Temp 98.6°F | Ht 70.0 in | Wt 249.6 lb

## 2022-03-23 DIAGNOSIS — N390 Urinary tract infection, site not specified: Secondary | ICD-10-CM

## 2022-03-23 DIAGNOSIS — R509 Fever, unspecified: Secondary | ICD-10-CM

## 2022-03-23 DIAGNOSIS — J029 Acute pharyngitis, unspecified: Secondary | ICD-10-CM

## 2022-03-23 DIAGNOSIS — R300 Dysuria: Secondary | ICD-10-CM | POA: Diagnosis not present

## 2022-03-23 LAB — POC URINALSYSI DIPSTICK (AUTOMATED)
Bilirubin, UA: NEGATIVE
Glucose, UA: NEGATIVE
Ketones, UA: NEGATIVE
Nitrite, UA: NEGATIVE
Protein, UA: POSITIVE — AB
Spec Grav, UA: 1.02 (ref 1.010–1.025)
Urobilinogen, UA: 1 E.U./dL
pH, UA: 6 (ref 5.0–8.0)

## 2022-03-23 LAB — POCT RAPID STREP A (OFFICE): Rapid Strep A Screen: NEGATIVE

## 2022-03-23 LAB — POC COVID19 BINAXNOW: SARS Coronavirus 2 Ag: NEGATIVE

## 2022-03-23 MED ORDER — CIPROFLOXACIN HCL 500 MG PO TABS: 500.0000 mg | ORAL_TABLET | Freq: Two times a day (BID) | ORAL | 0 refills | Status: AC

## 2022-03-23 NOTE — Progress Notes (Signed)
   Subjective:    Patient ID: Guy Moore, male    DOB: [birth year], 88 y.o.   MRN: [id number]  HPI Here for 4 days of fatigue, fever to 101 degrees, and urinary urgency and burning. No NVD or body aches. No coughing or ST.    Review of Systems  Constitutional:  Positive for fatigue and fever.  HENT: Negative.    Eyes: Negative.   Respiratory:  Positive for shortness of breath. Negative for cough and wheezing.   Cardiovascular: Negative.   Gastrointestinal: Negative.   Genitourinary:  Positive for dysuria and frequency. Negative for hematuria.       Objective:   Physical Exam Constitutional:      Appearance: Normal appearance. He is well-developed. He is not ill-appearing.  HENT:     Mouth/Throat:     Pharynx: Oropharynx is clear.  Eyes:     Conjunctiva/sclera: Conjunctivae normal.  Cardiovascular:     Rate and Rhythm: Normal rate and regular rhythm.     Pulses: Normal pulses.     Heart sounds: Normal heart sounds.  Pulmonary:     Effort: Pulmonary effort is normal.     Breath sounds: Normal breath sounds.  Abdominal:     General: Abdomen is flat. Bowel sounds are normal. There is no distension.     Palpations: Abdomen is soft. There is no mass.     Tenderness: There is no abdominal tenderness. There is no right CVA tenderness, left CVA tenderness, guarding or rebound.     Hernia: No hernia is present.  Lymphadenopathy:     Cervical: No cervical adenopathy.  Neurological:     Mental Status: He is alert.           Assessment & Plan:  UTI, treat with 10 days of Cipro. Culture the sample. Drink plenty of water. Alysia Penna, MD

## 2022-03-26 ENCOUNTER — Encounter: Payer: Self-pay | Admitting: Family Medicine

## 2022-03-26 LAB — URINE CULTURE
MICRO NUMBER:: 13513195
SPECIMEN QUALITY:: ADEQUATE

## 2022-04-06 ENCOUNTER — Encounter (INDEPENDENT_AMBULATORY_CARE_PROVIDER_SITE_OTHER): Payer: Medicare Other | Admitting: Ophthalmology

## 2022-04-06 DIAGNOSIS — H35033 Hypertensive retinopathy, bilateral: Secondary | ICD-10-CM

## 2022-04-06 DIAGNOSIS — H43813 Vitreous degeneration, bilateral: Secondary | ICD-10-CM

## 2022-04-06 DIAGNOSIS — D3131 Benign neoplasm of right choroid: Secondary | ICD-10-CM

## 2022-04-06 DIAGNOSIS — I10 Essential (primary) hypertension: Secondary | ICD-10-CM | POA: Diagnosis not present

## 2022-04-06 DIAGNOSIS — H353231 Exudative age-related macular degeneration, bilateral, with active choroidal neovascularization: Secondary | ICD-10-CM | POA: Diagnosis not present

## 2022-04-09 DIAGNOSIS — L814 Other melanin hyperpigmentation: Secondary | ICD-10-CM | POA: Diagnosis not present

## 2022-04-09 DIAGNOSIS — L570 Actinic keratosis: Secondary | ICD-10-CM | POA: Diagnosis not present

## 2022-04-09 DIAGNOSIS — L821 Other seborrheic keratosis: Secondary | ICD-10-CM | POA: Diagnosis not present

## 2022-04-09 DIAGNOSIS — L298 Other pruritus: Secondary | ICD-10-CM | POA: Diagnosis not present

## 2022-04-09 DIAGNOSIS — L910 Hypertrophic scar: Secondary | ICD-10-CM | POA: Diagnosis not present

## 2022-04-09 DIAGNOSIS — I872 Venous insufficiency (chronic) (peripheral): Secondary | ICD-10-CM | POA: Diagnosis not present

## 2022-04-09 DIAGNOSIS — D225 Melanocytic nevi of trunk: Secondary | ICD-10-CM | POA: Diagnosis not present

## 2022-04-09 DIAGNOSIS — L538 Other specified erythematous conditions: Secondary | ICD-10-CM | POA: Diagnosis not present

## 2022-04-16 DIAGNOSIS — D3191 Benign neoplasm of unspecified part of right eye: Secondary | ICD-10-CM | POA: Diagnosis not present

## 2022-04-16 DIAGNOSIS — H02834 Dermatochalasis of left upper eyelid: Secondary | ICD-10-CM | POA: Diagnosis not present

## 2022-04-16 DIAGNOSIS — H02831 Dermatochalasis of right upper eyelid: Secondary | ICD-10-CM | POA: Diagnosis not present

## 2022-04-16 DIAGNOSIS — H04123 Dry eye syndrome of bilateral lacrimal glands: Secondary | ICD-10-CM | POA: Diagnosis not present

## 2022-04-16 DIAGNOSIS — D3131 Benign neoplasm of right choroid: Secondary | ICD-10-CM | POA: Diagnosis not present

## 2022-04-16 DIAGNOSIS — Z961 Presence of intraocular lens: Secondary | ICD-10-CM | POA: Diagnosis not present

## 2022-04-16 DIAGNOSIS — H353231 Exudative age-related macular degeneration, bilateral, with active choroidal neovascularization: Secondary | ICD-10-CM | POA: Diagnosis not present

## 2022-04-21 DIAGNOSIS — H353231 Exudative age-related macular degeneration, bilateral, with active choroidal neovascularization: Secondary | ICD-10-CM | POA: Diagnosis not present

## 2022-04-21 DIAGNOSIS — Z961 Presence of intraocular lens: Secondary | ICD-10-CM | POA: Diagnosis not present

## 2022-04-21 DIAGNOSIS — D3141 Benign neoplasm of right ciliary body: Secondary | ICD-10-CM | POA: Diagnosis not present

## 2022-04-21 DIAGNOSIS — D3131 Benign neoplasm of right choroid: Secondary | ICD-10-CM | POA: Diagnosis not present

## 2022-04-27 ENCOUNTER — Ambulatory Visit (INDEPENDENT_AMBULATORY_CARE_PROVIDER_SITE_OTHER): Payer: Medicare Other | Admitting: *Deleted

## 2022-04-27 DIAGNOSIS — E538 Deficiency of other specified B group vitamins: Secondary | ICD-10-CM

## 2022-04-27 MED ORDER — CYANOCOBALAMIN 1000 MCG/ML IJ SOLN
1000.0000 ug | Freq: Once | INTRAMUSCULAR | Status: AC
  Administered 2022-04-27: 1000 ug via INTRAMUSCULAR

## 2022-04-27 NOTE — Progress Notes (Signed)
Per orders of Dr. Fry, injection of Cyanocobalamin 1000mcg given by Milyn Stapleton A. Patient tolerated injection well.  

## 2022-04-30 ENCOUNTER — Other Ambulatory Visit: Payer: Self-pay

## 2022-04-30 ENCOUNTER — Other Ambulatory Visit: Payer: Self-pay | Admitting: Family Medicine

## 2022-04-30 DIAGNOSIS — K219 Gastro-esophageal reflux disease without esophagitis: Secondary | ICD-10-CM

## 2022-04-30 MED ORDER — OMEPRAZOLE 40 MG PO CPDR: 40.0000 mg | DELAYED_RELEASE_CAPSULE | Freq: Every day | ORAL | 0 refills | Status: DC

## 2022-05-04 ENCOUNTER — Telehealth: Payer: Self-pay | Admitting: Internal Medicine

## 2022-05-04 ENCOUNTER — Encounter (INDEPENDENT_AMBULATORY_CARE_PROVIDER_SITE_OTHER): Payer: Medicare Other | Admitting: Ophthalmology

## 2022-05-04 DIAGNOSIS — I10 Essential (primary) hypertension: Secondary | ICD-10-CM | POA: Diagnosis not present

## 2022-05-04 DIAGNOSIS — D3131 Benign neoplasm of right choroid: Secondary | ICD-10-CM

## 2022-05-04 DIAGNOSIS — H35033 Hypertensive retinopathy, bilateral: Secondary | ICD-10-CM | POA: Diagnosis not present

## 2022-05-04 DIAGNOSIS — H43813 Vitreous degeneration, bilateral: Secondary | ICD-10-CM | POA: Diagnosis not present

## 2022-05-04 DIAGNOSIS — H353231 Exudative age-related macular degeneration, bilateral, with active choroidal neovascularization: Secondary | ICD-10-CM

## 2022-05-04 NOTE — Telephone Encounter (Signed)
Called patient regarding upcoming rescheduled November appointments, patient is notified. 

## 2022-05-07 DIAGNOSIS — Z974 Presence of external hearing-aid: Secondary | ICD-10-CM | POA: Diagnosis not present

## 2022-05-07 DIAGNOSIS — H6123 Impacted cerumen, bilateral: Secondary | ICD-10-CM | POA: Diagnosis not present

## 2022-05-08 ENCOUNTER — Telehealth: Payer: Self-pay | Admitting: Family Medicine

## 2022-05-08 ENCOUNTER — Other Ambulatory Visit: Payer: Self-pay

## 2022-05-08 MED ORDER — RIVAROXABAN 20 MG PO TABS: 20.0000 mg | ORAL_TABLET | Freq: Every day | ORAL | 1 refills | Status: DC

## 2022-05-08 NOTE — Telephone Encounter (Signed)
Refill for Xarelto '20mg'$  sent to [facility]

## 2022-05-08 NOTE — Telephone Encounter (Signed)
Pt was last seen in June 2023 and would like a refill on XARELTO 20 MG TABS tablet  [place], [place] Phone:  [phone]  Fax:  [phone]

## 2022-05-18 ENCOUNTER — Ambulatory Visit (INDEPENDENT_AMBULATORY_CARE_PROVIDER_SITE_OTHER): Payer: Medicare Other

## 2022-05-18 DIAGNOSIS — I495 Sick sinus syndrome: Secondary | ICD-10-CM

## 2022-05-19 LAB — CUP PACEART REMOTE DEVICE CHECK
Battery Remaining Longevity: 101 mo
Battery Voltage: 2.99 V
Brady Statistic AP VP Percent: 0 %
Brady Statistic AP VS Percent: 0 %
Brady Statistic AS VP Percent: 88.43 %
Brady Statistic AS VS Percent: 11.57 %
Brady Statistic RA Percent Paced: 0 %
Brady Statistic RV Percent Paced: 88.43 %
Date Time Interrogation Session: 20230805214955
Implantable Lead Implant Date: 20180124
Implantable Lead Implant Date: 20180124
Implantable Lead Location: 753859
Implantable Lead Location: 753860
Implantable Lead Model: 5076
Implantable Lead Model: 5076
Implantable Pulse Generator Implant Date: 20180124
Lead Channel Impedance Value: 304 Ohm
Lead Channel Impedance Value: 323 Ohm
Lead Channel Impedance Value: 399 Ohm
Lead Channel Impedance Value: 418 Ohm
Lead Channel Pacing Threshold Amplitude: 0.75 V
Lead Channel Pacing Threshold Pulse Width: 0.4 ms
Lead Channel Sensing Intrinsic Amplitude: 0.25 mV
Lead Channel Sensing Intrinsic Amplitude: 0.25 mV
Lead Channel Sensing Intrinsic Amplitude: 5.25 mV
Lead Channel Sensing Intrinsic Amplitude: 5.25 mV
Lead Channel Setting Pacing Amplitude: 1.5 V
Lead Channel Setting Pacing Pulse Width: 0.4 ms
Lead Channel Setting Sensing Sensitivity: 0.9 mV

## 2022-05-28 ENCOUNTER — Ambulatory Visit (INDEPENDENT_AMBULATORY_CARE_PROVIDER_SITE_OTHER): Payer: Medicare Other

## 2022-05-28 DIAGNOSIS — E538 Deficiency of other specified B group vitamins: Secondary | ICD-10-CM | POA: Diagnosis not present

## 2022-05-28 NOTE — Progress Notes (Signed)
Patient is here for monthly injection for VitB12.  Per orders of Dorothyann Peng, NP, injection of VitB12 1030mg given by KEncarnacion Slates Patient tolerated injection well.  Next VitB12 is due next month.

## 2022-06-01 ENCOUNTER — Encounter (INDEPENDENT_AMBULATORY_CARE_PROVIDER_SITE_OTHER): Payer: Medicare Other | Admitting: Ophthalmology

## 2022-06-01 DIAGNOSIS — D3131 Benign neoplasm of right choroid: Secondary | ICD-10-CM | POA: Diagnosis not present

## 2022-06-01 DIAGNOSIS — H43813 Vitreous degeneration, bilateral: Secondary | ICD-10-CM

## 2022-06-01 DIAGNOSIS — H35033 Hypertensive retinopathy, bilateral: Secondary | ICD-10-CM

## 2022-06-01 DIAGNOSIS — I10 Essential (primary) hypertension: Secondary | ICD-10-CM

## 2022-06-01 DIAGNOSIS — H353231 Exudative age-related macular degeneration, bilateral, with active choroidal neovascularization: Secondary | ICD-10-CM | POA: Diagnosis not present

## 2022-06-18 NOTE — Progress Notes (Signed)
Remote pacemaker transmission.   

## 2022-06-29 ENCOUNTER — Encounter (INDEPENDENT_AMBULATORY_CARE_PROVIDER_SITE_OTHER): Payer: Medicare Other | Admitting: Ophthalmology

## 2022-06-29 DIAGNOSIS — H43813 Vitreous degeneration, bilateral: Secondary | ICD-10-CM | POA: Diagnosis not present

## 2022-06-29 DIAGNOSIS — H353231 Exudative age-related macular degeneration, bilateral, with active choroidal neovascularization: Secondary | ICD-10-CM | POA: Diagnosis not present

## 2022-06-29 DIAGNOSIS — I10 Essential (primary) hypertension: Secondary | ICD-10-CM | POA: Diagnosis not present

## 2022-06-29 DIAGNOSIS — D3131 Benign neoplasm of right choroid: Secondary | ICD-10-CM

## 2022-06-29 DIAGNOSIS — H35033 Hypertensive retinopathy, bilateral: Secondary | ICD-10-CM | POA: Diagnosis not present

## 2022-06-30 ENCOUNTER — Encounter: Payer: Self-pay | Admitting: Family Medicine

## 2022-07-01 NOTE — Telephone Encounter (Signed)
He should get the new Covid shot as soon as he can

## 2022-07-02 ENCOUNTER — Ambulatory Visit (INDEPENDENT_AMBULATORY_CARE_PROVIDER_SITE_OTHER): Payer: Medicare Other

## 2022-07-02 DIAGNOSIS — E538 Deficiency of other specified B group vitamins: Secondary | ICD-10-CM | POA: Diagnosis not present

## 2022-07-02 NOTE — Progress Notes (Signed)
Per orders of [employer], injection of Cyanocobalamin Inj. 1068mg given by KEncarnacion Slateson R deltoid.  Patient tolerated injection well.

## 2022-07-15 DIAGNOSIS — L570 Actinic keratosis: Secondary | ICD-10-CM | POA: Diagnosis not present

## 2022-07-27 ENCOUNTER — Encounter (INDEPENDENT_AMBULATORY_CARE_PROVIDER_SITE_OTHER): Payer: Medicare Other | Admitting: Ophthalmology

## 2022-07-27 DIAGNOSIS — D3131 Benign neoplasm of right choroid: Secondary | ICD-10-CM

## 2022-07-27 DIAGNOSIS — I10 Essential (primary) hypertension: Secondary | ICD-10-CM | POA: Diagnosis not present

## 2022-07-27 DIAGNOSIS — H353231 Exudative age-related macular degeneration, bilateral, with active choroidal neovascularization: Secondary | ICD-10-CM | POA: Diagnosis not present

## 2022-07-27 DIAGNOSIS — H43813 Vitreous degeneration, bilateral: Secondary | ICD-10-CM | POA: Diagnosis not present

## 2022-07-27 DIAGNOSIS — H35033 Hypertensive retinopathy, bilateral: Secondary | ICD-10-CM | POA: Diagnosis not present

## 2022-07-29 ENCOUNTER — Other Ambulatory Visit: Payer: Self-pay | Admitting: Family Medicine

## 2022-07-31 ENCOUNTER — Ambulatory Visit (INDEPENDENT_AMBULATORY_CARE_PROVIDER_SITE_OTHER): Payer: Medicare Other

## 2022-07-31 DIAGNOSIS — E538 Deficiency of other specified B group vitamins: Secondary | ICD-10-CM | POA: Diagnosis not present

## 2022-07-31 NOTE — Progress Notes (Signed)
Patient is here for his monthly VitB 12 injection.  Per orders of Dr. Sarajane Jews, injection of Cyanocobalamin Inj. 1000 mcg given by Encarnacion Slates on Right Deltoid.  Patient tolerated injection well.

## 2022-08-05 ENCOUNTER — Encounter (INDEPENDENT_AMBULATORY_CARE_PROVIDER_SITE_OTHER): Payer: Medicare Other | Admitting: Ophthalmology

## 2022-08-05 DIAGNOSIS — H26492 Other secondary cataract, left eye: Secondary | ICD-10-CM | POA: Diagnosis not present

## 2022-08-17 ENCOUNTER — Ambulatory Visit (INDEPENDENT_AMBULATORY_CARE_PROVIDER_SITE_OTHER): Payer: Medicare Other

## 2022-08-17 DIAGNOSIS — I495 Sick sinus syndrome: Secondary | ICD-10-CM

## 2022-08-18 LAB — CUP PACEART REMOTE DEVICE CHECK
Battery Remaining Longevity: 98 mo
Battery Voltage: 2.99 V
Brady Statistic AP VP Percent: 0 %
Brady Statistic AP VS Percent: 0 %
Brady Statistic AS VP Percent: 90.23 %
Brady Statistic AS VS Percent: 9.77 %
Brady Statistic RA Percent Paced: 0 %
Brady Statistic RV Percent Paced: 90.23 %
Date Time Interrogation Session: 20231106031910
Implantable Lead Connection Status: 753985
Implantable Lead Connection Status: 753985
Implantable Lead Implant Date: 20180124
Implantable Lead Implant Date: 20180124
Implantable Lead Location: 753859
Implantable Lead Location: 753860
Implantable Lead Model: 5076
Implantable Lead Model: 5076
Implantable Pulse Generator Implant Date: 20180124
Lead Channel Impedance Value: 304 Ohm
Lead Channel Impedance Value: 323 Ohm
Lead Channel Impedance Value: 399 Ohm
Lead Channel Impedance Value: 418 Ohm
Lead Channel Pacing Threshold Amplitude: 0.75 V
Lead Channel Pacing Threshold Pulse Width: 0.4 ms
Lead Channel Sensing Intrinsic Amplitude: 0.25 mV
Lead Channel Sensing Intrinsic Amplitude: 0.25 mV
Lead Channel Sensing Intrinsic Amplitude: 4.5 mV
Lead Channel Sensing Intrinsic Amplitude: 4.5 mV
Lead Channel Setting Pacing Amplitude: 1.5 V
Lead Channel Setting Pacing Pulse Width: 0.4 ms
Lead Channel Setting Sensing Sensitivity: 0.9 mV
Zone Setting Status: 755011

## 2022-08-20 DIAGNOSIS — N200 Calculus of kidney: Secondary | ICD-10-CM | POA: Diagnosis not present

## 2022-08-21 ENCOUNTER — Telehealth: Payer: Self-pay | Admitting: Internal Medicine

## 2022-08-21 NOTE — Telephone Encounter (Signed)
Rescheduled 11/27 appointment to 11/20 due to provider pal, patient has been called and notified.

## 2022-08-24 ENCOUNTER — Encounter (INDEPENDENT_AMBULATORY_CARE_PROVIDER_SITE_OTHER): Payer: Medicare Other | Admitting: Ophthalmology

## 2022-08-24 DIAGNOSIS — D3131 Benign neoplasm of right choroid: Secondary | ICD-10-CM

## 2022-08-24 DIAGNOSIS — H353231 Exudative age-related macular degeneration, bilateral, with active choroidal neovascularization: Secondary | ICD-10-CM

## 2022-08-24 DIAGNOSIS — H43813 Vitreous degeneration, bilateral: Secondary | ICD-10-CM | POA: Diagnosis not present

## 2022-08-24 DIAGNOSIS — H35033 Hypertensive retinopathy, bilateral: Secondary | ICD-10-CM | POA: Diagnosis not present

## 2022-08-24 DIAGNOSIS — I10 Essential (primary) hypertension: Secondary | ICD-10-CM

## 2022-08-31 ENCOUNTER — Ambulatory Visit: Payer: Medicare Other | Admitting: Internal Medicine

## 2022-08-31 ENCOUNTER — Other Ambulatory Visit: Payer: Medicare Other

## 2022-08-31 ENCOUNTER — Inpatient Hospital Stay (HOSPITAL_BASED_OUTPATIENT_CLINIC_OR_DEPARTMENT_OTHER): Payer: Medicare Other | Admitting: Internal Medicine

## 2022-08-31 ENCOUNTER — Inpatient Hospital Stay: Payer: Medicare Other | Attending: Internal Medicine

## 2022-08-31 VITALS — BP 144/84 | HR 88 | Temp 98.1°F | Resp 16 | Wt 243.2 lb

## 2022-08-31 DIAGNOSIS — D696 Thrombocytopenia, unspecified: Secondary | ICD-10-CM | POA: Diagnosis not present

## 2022-08-31 DIAGNOSIS — D72819 Decreased white blood cell count, unspecified: Secondary | ICD-10-CM | POA: Diagnosis not present

## 2022-08-31 DIAGNOSIS — I10 Essential (primary) hypertension: Secondary | ICD-10-CM | POA: Insufficient documentation

## 2022-08-31 DIAGNOSIS — Z79899 Other long term (current) drug therapy: Secondary | ICD-10-CM | POA: Insufficient documentation

## 2022-08-31 DIAGNOSIS — Z7901 Long term (current) use of anticoagulants: Secondary | ICD-10-CM | POA: Diagnosis not present

## 2022-08-31 DIAGNOSIS — I4891 Unspecified atrial fibrillation: Secondary | ICD-10-CM | POA: Diagnosis not present

## 2022-08-31 LAB — CMP (CANCER CENTER ONLY)
ALT: 14 U/L (ref 0–44)
AST: 16 U/L (ref 15–41)
Albumin: 4.5 g/dL (ref 3.5–5.0)
Alkaline Phosphatase: 42 U/L (ref 38–126)
Anion gap: 6 (ref 5–15)
BUN: 24 mg/dL — ABNORMAL HIGH (ref 8–23)
CO2: 28 mmol/L (ref 22–32)
Calcium: 9.3 mg/dL (ref 8.9–10.3)
Chloride: 107 mmol/L (ref 98–111)
Creatinine: 1.08 mg/dL (ref 0.61–1.24)
GFR, Estimated: >60 mL/min (ref >60)
Glucose, Bld: 110 mg/dL — ABNORMAL HIGH (ref 70–99)
Potassium: 4.3 mmol/L (ref 3.5–5.1)
Sodium: 141 mmol/L (ref 135–145)
Total Bilirubin: 0.8 mg/dL (ref 0.3–1.2)
Total Protein: 6.9 g/dL (ref 6.5–8.1)

## 2022-08-31 LAB — CBC WITH DIFFERENTIAL (CANCER CENTER ONLY)
Abs Immature Granulocytes: 0.01 10*3/uL (ref 0.00–0.07)
Basophils Absolute: 0.1 10*3/uL (ref 0.0–0.1)
Basophils Relative: 2 %
Eosinophils Absolute: 0.2 10*3/uL (ref 0.0–0.5)
Eosinophils Relative: 5 %
HCT: 41.3 % (ref 39.0–52.0)
Hemoglobin: 13.5 g/dL (ref 13.0–17.0)
Immature Granulocytes: 0 %
Lymphocytes Relative: 15 %
Lymphs Abs: 0.5 10*3/uL — ABNORMAL LOW (ref 0.7–4.0)
MCH: 29.5 pg (ref 26.0–34.0)
MCHC: 32.7 g/dL (ref 30.0–36.0)
MCV: 90.2 fL (ref 80.0–100.0)
Monocytes Absolute: 0.2 10*3/uL (ref 0.1–1.0)
Monocytes Relative: 6 %
Neutro Abs: 2.4 10*3/uL (ref 1.7–7.7)
Neutrophils Relative %: 72 %
Platelet Count: 108 10*3/uL — ABNORMAL LOW (ref 150–400)
RBC: 4.58 MIL/uL (ref 4.22–5.81)
RDW: 14.4 % (ref 11.5–15.5)
WBC Count: 3.3 10*3/uL — ABNORMAL LOW (ref 4.0–10.5)
nRBC: 0 % (ref 0.0–0.2)

## 2022-08-31 LAB — IRON AND IRON BINDING CAPACITY (CC-WL,HP ONLY)
Iron: 89 ug/dL (ref 45–182)
Saturation Ratios: 23 % (ref 17.9–39.5)
TIBC: 393 ug/dL (ref 250–450)
UIBC: 304 ug/dL (ref 117–376)

## 2022-08-31 LAB — VITAMIN B12: Vitamin B-12: 498 pg/mL (ref 180–914)

## 2022-08-31 NOTE — Progress Notes (Signed)
[facility] [phone]   [phone]  OFFICE PROGRESS NOTE  [place], [place] [address]  DIAGNOSIS: Intermittent Bicytopenia   PRIOR THERAPY: None   CURRENT THERAPY:  Vitamin B12 injection on a monthly basis  INTERVAL HISTORY: Guy Moore 88 y.o. male returns to the clinic today for follow-up visit.  The patient is feeling fine today with no concerning complaints.  He denied having any current chest pain, shortness of breath, cough or hemoptysis.  He was able to work in his yard for almost 7 hours few days ago cleaning leaves and bushes.  He denied having any nausea, vomiting, diarrhea or constipation.  He has no headache or visual changes.  He intentionally lost few pounds recently.  He is here today for evaluation and repeat blood work.  MEDICAL HISTORY: Past Medical History:  Diagnosis Date   Allergic rhinitis    Arthritis    Asthma    Atrial fibrillation [facility])    sees Dr. [name] at [facility]    Atrial flutter ([place])    [place] (basal cell carcinoma of skin)    Nose   COPD with emphysema ([place])    sees Dr. [name]    Depression    Diabetes mellitus ([place])    Diet control    Diverticulosis    Erosive esophagitis    Esophageal stricture    Gastric ulcer    Hearing loss    uses amplification   Hemorrhoids    Hiatal hernia    HTN (hypertension)    Hyperlipidemia    Knee problem    2% permanent partial impairment Right   Macular degeneration    Perineal abscess    Pneumonia    Sleep apnea     ALLERGIES:  has No Known Allergies.  MEDICATIONS:  Current Outpatient Medications  Medication Sig Dispense Refill   acetaminophen (TYLENOL) 325 MG tablet Take 2 tablets (650 mg total) by mouth every 6 (six) hours as needed for mild pain (or Fever >/= 101).     albuterol (VENTOLIN HFA) 108 (90 Base) MCG/ACT inhaler USE 2 INHALATIONS EVERY 6 HOURS AS NEEDED FOR WHEEZING OR  SHORTNESS OF BREATH 54 g 3   amLODipine (NORVASC) 5 MG tablet TAKE 1 TABLET DAILY 90 tablet 0   BESIVANCE 0.6 % SUSP Place 1 drop into both eyes every 30 (thirty) days.     FLOVENT DISKUS 50 MCG/ACT AEPB USE 1 INHALATION TWICE A DAY 180 each 3   hypromellose (GENTEAL) 0.3 % GEL ophthalmic ointment as needed.     MAGNESIUM GLUCONATE PO Take 1 tablet by mouth daily.     Methylcellulose, Laxative, (CITRUCEL PO) Take 2 tablets by mouth daily after breakfast.     Multiple Vitamins-Minerals (PRESERVISION AREDS 2) CAPS Take 1 capsule by mouth 2 (two) times daily.     omeprazole (PRILOSEC) 40 MG capsule Take 1 capsule (40 mg total) by mouth daily. 90 capsule 0   potassium chloride SA (KLOR-CON) 20 MEQ tablet TAKE 1 TABLET DAILY 90 tablet 3   pravastatin (PRAVACHOL) 40 MG tablet TAKE 1 TABLET AT BEDTIME 90 tablet 3   rivaroxaban (XARELTO) 20 MG TABS tablet Take 1 tablet (20 mg total) by mouth daily. 90 tablet 1   valsartan (DIOVAN) 160 MG tablet TAKE 1 TABLET DAILY 90 tablet 0   Current Facility-Administered Medications  Medication Dose Route Frequency Provider Last Rate Last Admin  cyanocobalamin ((VITAMIN B-12)) injection 1,000 mcg  1,000 mcg Intramuscular Q30 days Laurey Morale, MD   1,000 mcg at 07/31/22 1411    SURGICAL HISTORY:  Past Surgical History:  Procedure Laterality Date   CARDIAC ELECTROPHYSIOLOGY STUDY AND ABLATION     x 4 (sees Dr. [name] at [facility])   [place]  06/13/1979   VASECTOMY  33/82/5053   w/complications    REVIEW OF SYSTEMS:  A comprehensive review of systems was negative.   PHYSICAL EXAMINATION: General appearance: alert, cooperative, and no distress Head: Normocephalic, without obvious abnormality, atraumatic Neck: no adenopathy, no JVD, supple, symmetrical, trachea midline, and thyroid not enlarged, symmetric, no tenderness/mass/nodules Lymph nodes: Cervical, supraclavicular, and  axillary nodes normal. Resp: clear to auscultation bilaterally Back: symmetric, no curvature. ROM normal. No CVA tenderness. Cardio: regular rate and rhythm, S1, S2 normal, no murmur, click, rub or gallop GI: soft, non-tender; bowel sounds normal; no masses,  no organomegaly Extremities: extremities normal, atraumatic, no cyanosis or edema  ECOG PERFORMANCE STATUS: 1 - Symptomatic but completely ambulatory  Blood pressure (!) 144/84, pulse 88, temperature 98.1 F (36.7 C), temperature source Oral, resp. rate 16, weight 243 lb 3.2 oz (110.3 kg), SpO2 96 %.  LABORATORY DATA: Lab Results  Component Value Date   WBC 3.3 (L) 08/31/2022   HGB 13.5 08/31/2022   HCT 41.3 08/31/2022   MCV 90.2 08/31/2022   PLT 108 (L) 08/31/2022      Chemistry      Component Value Date/Time   NA 139 09/01/2021 1327   NA 141 06/27/2015 0000   K 4.1 09/01/2021 1327   CL 105 09/01/2021 1327   CO2 25 09/01/2021 1327   BUN 21 09/01/2021 1327   BUN 21 06/27/2015 0000   CREATININE 0.93 09/01/2021 1327   GLU 121 06/27/2015 0000      Component Value Date/Time   CALCIUM 8.9 09/01/2021 1327   ALKPHOS 54 09/01/2021 1327   AST 16 09/01/2021 1327   ALT 13 09/01/2021 1327   BILITOT 0.8 09/01/2021 1327       RADIOGRAPHIC STUDIES: CUP PACEART REMOTE DEVICE CHECK  Result Date: 08/18/2022 Scheduled remote reviewed. Normal device function.  1 NSVT, 6 beats Next remote 91 days. LA   ASSESSMENT AND PLAN: This is a very pleasant 88 years old white male with bicytopenia including leukocytopenia and thrombocytopenia likely secondary to iron deficiency but early MDS could not be completely excluded. The patient had a bone marrow biopsy and aspirate performed recently that was nonspecific but again could not rule out early myelodysplastic syndrome. The patient is currently on observation except for the vitamin B12 injection and he is tolerating it well. Repeat CBC today showed persistent mild leukocytopenia and  thrombocytopenia. I recommended for him to continue on his current treatment with vitamin B12 injection on monthly basis. I will see him back for follow-up visit in 6 months for evaluation and repeat blood work. He was advised to call immediately if he has any other concerning issues in the interval. The patient voices understanding of current disease status and treatment options and is in agreement with the current care plan.  All questions were answered. The patient knows to call the clinic with any problems, questions or concerns. We can certainly see the patient much sooner if necessary.  Disclaimer: This note was dictated with voice recognition software. Similar sounding words can inadvertently be transcribed and may  not be corrected upon review.

## 2022-09-01 ENCOUNTER — Encounter: Payer: Self-pay | Admitting: Family Medicine

## 2022-09-01 ENCOUNTER — Ambulatory Visit (INDEPENDENT_AMBULATORY_CARE_PROVIDER_SITE_OTHER): Payer: Medicare Other

## 2022-09-01 ENCOUNTER — Ambulatory Visit: Payer: Medicare Other

## 2022-09-01 ENCOUNTER — Encounter (INDEPENDENT_AMBULATORY_CARE_PROVIDER_SITE_OTHER): Payer: Medicare Other | Admitting: Ophthalmology

## 2022-09-01 DIAGNOSIS — E538 Deficiency of other specified B group vitamins: Secondary | ICD-10-CM

## 2022-09-01 LAB — FERRITIN: Ferritin: 51 ng/mL (ref 24–336)

## 2022-09-01 NOTE — Progress Notes (Signed)
Pt here for monthly B12 injection per Dr Sarajane Jews  B12 1025mg given IM left deltoid and pt tolerated injection well.  Pt to schedule appt upon check out.

## 2022-09-07 ENCOUNTER — Inpatient Hospital Stay: Payer: Medicare Other

## 2022-09-07 ENCOUNTER — Inpatient Hospital Stay: Payer: Medicare Other | Admitting: Internal Medicine

## 2022-09-09 ENCOUNTER — Other Ambulatory Visit: Payer: Self-pay | Admitting: Family Medicine

## 2022-09-10 ENCOUNTER — Encounter (INDEPENDENT_AMBULATORY_CARE_PROVIDER_SITE_OTHER): Payer: Medicare Other | Admitting: Ophthalmology

## 2022-09-10 DIAGNOSIS — H26491 Other secondary cataract, right eye: Secondary | ICD-10-CM

## 2022-09-14 DIAGNOSIS — H6121 Impacted cerumen, right ear: Secondary | ICD-10-CM | POA: Diagnosis not present

## 2022-09-21 ENCOUNTER — Encounter (INDEPENDENT_AMBULATORY_CARE_PROVIDER_SITE_OTHER): Payer: Medicare Other | Admitting: Ophthalmology

## 2022-09-21 DIAGNOSIS — H353231 Exudative age-related macular degeneration, bilateral, with active choroidal neovascularization: Secondary | ICD-10-CM

## 2022-09-21 DIAGNOSIS — H35033 Hypertensive retinopathy, bilateral: Secondary | ICD-10-CM | POA: Diagnosis not present

## 2022-09-21 DIAGNOSIS — I10 Essential (primary) hypertension: Secondary | ICD-10-CM

## 2022-09-21 DIAGNOSIS — D3131 Benign neoplasm of right choroid: Secondary | ICD-10-CM

## 2022-09-21 DIAGNOSIS — H43813 Vitreous degeneration, bilateral: Secondary | ICD-10-CM | POA: Diagnosis not present

## 2022-09-22 NOTE — Progress Notes (Signed)
Remote pacemaker transmission.   

## 2022-10-01 ENCOUNTER — Ambulatory Visit (INDEPENDENT_AMBULATORY_CARE_PROVIDER_SITE_OTHER): Payer: Medicare Other

## 2022-10-01 DIAGNOSIS — E538 Deficiency of other specified B group vitamins: Secondary | ICD-10-CM | POA: Diagnosis not present

## 2022-10-01 MED ORDER — CYANOCOBALAMIN 1000 MCG/ML IJ SOLN
1000.0000 ug | Freq: Once | INTRAMUSCULAR | Status: AC
  Administered 2022-10-01: 1000 ug via INTRAMUSCULAR

## 2022-10-01 NOTE — Progress Notes (Signed)
Pt here for monthly B12 injection per Dr Sarajane Jews  B12 1011mg given IM, and pt tolerated injection well.  Next B12 injection scheduled for 11/01/22.

## 2022-10-15 DIAGNOSIS — L570 Actinic keratosis: Secondary | ICD-10-CM | POA: Diagnosis not present

## 2022-10-15 DIAGNOSIS — I872 Venous insufficiency (chronic) (peripheral): Secondary | ICD-10-CM | POA: Diagnosis not present

## 2022-10-22 ENCOUNTER — Encounter: Payer: Self-pay | Admitting: Family Medicine

## 2022-10-22 ENCOUNTER — Ambulatory Visit (INDEPENDENT_AMBULATORY_CARE_PROVIDER_SITE_OTHER): Payer: Medicare Other | Admitting: Family Medicine

## 2022-10-22 VITALS — BP 160/90 | HR 83 | Temp 97.8°F | Ht 70.0 in | Wt 243.8 lb

## 2022-10-22 DIAGNOSIS — N390 Urinary tract infection, site not specified: Secondary | ICD-10-CM | POA: Diagnosis not present

## 2022-10-22 DIAGNOSIS — R350 Frequency of micturition: Secondary | ICD-10-CM | POA: Diagnosis not present

## 2022-10-22 LAB — POC URINALSYSI DIPSTICK (AUTOMATED)
Bilirubin, UA: NEGATIVE
Blood, UA: POSITIVE
Glucose, UA: NEGATIVE
Ketones, UA: NEGATIVE
Nitrite, UA: POSITIVE
Protein, UA: POSITIVE — AB
Spec Grav, UA: 1.025 (ref 1.010–1.025)
Urobilinogen, UA: 0.2 E.U./dL
pH, UA: 5 (ref 5.0–8.0)

## 2022-10-22 MED ORDER — CIPROFLOXACIN HCL 500 MG PO TABS: 500.0000 mg | ORAL_TABLET | Freq: Two times a day (BID) | ORAL | 0 refills | Status: AC

## 2022-10-22 NOTE — Progress Notes (Signed)
   Subjective:    Patient ID: Guy Moore, male    DOB: 10/26/1933, 89 y.o.   MRN: [id number]  HPI Here for 2 days of urinary frequency and passing dark urine. No fever or nausea or back pain. He was treated for an Enterobacter UTI in November with Cipro, and he says this feels just like that one did. He drinks plenty of water.    Review of Systems  Constitutional:  Positive for fatigue. Negative for fever.  Respiratory: Negative.    Cardiovascular: Negative.   Gastrointestinal: Negative.   Genitourinary:  Positive for dysuria, frequency and urgency. Negative for flank pain.       Objective:   Physical Exam Constitutional:      Appearance: Normal appearance.  Cardiovascular:     Rate and Rhythm: Normal rate and regular rhythm.     Pulses: Normal pulses.     Heart sounds: Normal heart sounds.  Pulmonary:     Effort: Pulmonary effort is normal.     Breath sounds: Normal breath sounds.  Abdominal:     Tenderness: There is no right CVA tenderness or left CVA tenderness.  Neurological:     Mental Status: He is alert.           Assessment & Plan:  UTI, treat with 10 days of Cipro. Culture the sample.  Alysia Penna, MD

## 2022-10-23 ENCOUNTER — Telehealth: Payer: Self-pay

## 2022-10-23 NOTE — Telephone Encounter (Signed)
---  Caller states they had blood in urine and saw Dr. Sarajane Jews today and was put on Cipro. Caller states he is urination blood and was laying down and had severe lower right back pain. Pain is sharp and at times it eases up some. Pain is at a 4/10 currently down from an 8/10. Denies nausea or vomiting. Temp is 97.4 oral  10/22/2022 11:58:41 PM See HCP within 4 Hours (or PCP triage) Cruzita Lederer, RN, [facility] - ED

## 2022-10-23 NOTE — Telephone Encounter (Signed)
This could be from the UTI, but it could also be from a kidney stone. If the pain is not too bad, keep taking the Cipro and follow up with [place] next week. However if the pain gets any worse, he should go to the ED so they can get a scan to look for stones, etc

## 2022-10-23 NOTE — Telephone Encounter (Signed)
Spoke with pt regarding hematuria ins.

## 2022-10-25 LAB — URINE CULTURE
MICRO NUMBER:: 14419071
SPECIMEN QUALITY:: ADEQUATE

## 2022-10-26 ENCOUNTER — Encounter (INDEPENDENT_AMBULATORY_CARE_PROVIDER_SITE_OTHER): Payer: Medicare Other | Admitting: Ophthalmology

## 2022-10-26 DIAGNOSIS — H43813 Vitreous degeneration, bilateral: Secondary | ICD-10-CM

## 2022-10-26 DIAGNOSIS — H35033 Hypertensive retinopathy, bilateral: Secondary | ICD-10-CM

## 2022-10-26 DIAGNOSIS — D3131 Benign neoplasm of right choroid: Secondary | ICD-10-CM | POA: Diagnosis not present

## 2022-10-26 DIAGNOSIS — I10 Essential (primary) hypertension: Secondary | ICD-10-CM

## 2022-10-26 DIAGNOSIS — H353231 Exudative age-related macular degeneration, bilateral, with active choroidal neovascularization: Secondary | ICD-10-CM | POA: Diagnosis not present

## 2022-10-27 ENCOUNTER — Other Ambulatory Visit: Payer: Self-pay

## 2022-10-27 ENCOUNTER — Encounter (HOSPITAL_BASED_OUTPATIENT_CLINIC_OR_DEPARTMENT_OTHER): Payer: Self-pay

## 2022-10-27 ENCOUNTER — Emergency Department (HOSPITAL_BASED_OUTPATIENT_CLINIC_OR_DEPARTMENT_OTHER): Payer: Medicare Other

## 2022-10-27 ENCOUNTER — Other Ambulatory Visit (HOSPITAL_BASED_OUTPATIENT_CLINIC_OR_DEPARTMENT_OTHER): Payer: Self-pay

## 2022-10-27 ENCOUNTER — Emergency Department (HOSPITAL_BASED_OUTPATIENT_CLINIC_OR_DEPARTMENT_OTHER)
Admission: EM | Admit: 2022-10-27 | Discharge: 2022-10-27 | Disposition: A | Discharge status: Home / Self Care | Payer: Medicare Other | Attending: Emergency Medicine | Admitting: Emergency Medicine

## 2022-10-27 DIAGNOSIS — N132 Hydronephrosis with renal and ureteral calculous obstruction: Secondary | ICD-10-CM | POA: Diagnosis not present

## 2022-10-27 DIAGNOSIS — N200 Calculus of kidney: Secondary | ICD-10-CM | POA: Insufficient documentation

## 2022-10-27 DIAGNOSIS — K5730 Diverticulosis of large intestine without perforation or abscess without bleeding: Secondary | ICD-10-CM | POA: Diagnosis not present

## 2022-10-27 DIAGNOSIS — Z7901 Long term (current) use of anticoagulants: Secondary | ICD-10-CM | POA: Insufficient documentation

## 2022-10-27 DIAGNOSIS — R109 Unspecified abdominal pain: Secondary | ICD-10-CM | POA: Diagnosis present

## 2022-10-27 LAB — BASIC METABOLIC PANEL
Anion gap: 12 (ref 5–15)
BUN: 23 mg/dL (ref 8–23)
CO2: 25 mmol/L (ref 22–32)
Calcium: 9.3 mg/dL (ref 8.9–10.3)
Chloride: 105 mmol/L (ref 98–111)
Creatinine, Ser: 1.2 mg/dL (ref 0.61–1.24)
GFR, Estimated: 59 mL/min — ABNORMAL LOW (ref >60)
Glucose, Bld: 114 mg/dL — ABNORMAL HIGH (ref 70–99)
Potassium: 4.2 mmol/L (ref 3.5–5.1)
Sodium: 142 mmol/L (ref 135–145)

## 2022-10-27 LAB — CBC WITH DIFFERENTIAL/PLATELET
Abs Immature Granulocytes: 0.01 10*3/uL (ref 0.00–0.07)
Basophils Absolute: 0 10*3/uL (ref 0.0–0.1)
Basophils Relative: 1 %
Eosinophils Absolute: 0.1 10*3/uL (ref 0.0–0.5)
Eosinophils Relative: 4 %
HCT: 44.1 % (ref 39.0–52.0)
Hemoglobin: 14.4 g/dL (ref 13.0–17.0)
Immature Granulocytes: 0 %
Lymphocytes Relative: 12 %
Lymphs Abs: 0.4 10*3/uL — ABNORMAL LOW (ref 0.7–4.0)
MCH: 29.3 pg (ref 26.0–34.0)
MCHC: 32.7 g/dL (ref 30.0–36.0)
MCV: 89.6 fL (ref 80.0–100.0)
Monocytes Absolute: 0.3 10*3/uL (ref 0.1–1.0)
Monocytes Relative: 7 %
Neutro Abs: 2.6 10*3/uL (ref 1.7–7.7)
Neutrophils Relative %: 76 %
Platelets: 108 10*3/uL — ABNORMAL LOW (ref 150–400)
RBC: 4.92 MIL/uL (ref 4.22–5.81)
RDW: 15.1 % (ref 11.5–15.5)
WBC: 3.4 10*3/uL — ABNORMAL LOW (ref 4.0–10.5)
nRBC: 0 % (ref 0.0–0.2)

## 2022-10-27 LAB — URINALYSIS, ROUTINE W REFLEX MICROSCOPIC
Bacteria, UA: NONE SEEN
Bilirubin Urine: NEGATIVE
Glucose, UA: NEGATIVE mg/dL
Ketones, ur: NEGATIVE mg/dL
Nitrite: NEGATIVE
RBC / HPF: >50 RBC/hpf — ABNORMAL HIGH (ref 0–5)
Specific Gravity, Urine: 1.021 (ref 1.005–1.030)
pH: 5 (ref 5.0–8.0)

## 2022-10-27 MED ORDER — OXYCODONE HCL 5 MG PO TABS
5.0000 mg | ORAL_TABLET | Freq: Once | ORAL | Status: AC
  Administered 2022-10-27: 5 mg via ORAL
  Filled 2022-10-27: qty 1

## 2022-10-27 MED ORDER — ONDANSETRON 4 MG PO TBDP
4.0000 mg | ORAL_TABLET | ORAL | 0 refills | Status: DC | PRN
  Filled 2022-10-27: qty 20, 4d supply, fill #0

## 2022-10-27 MED ORDER — ACETAMINOPHEN 500 MG PO TABS
1000.0000 mg | ORAL_TABLET | Freq: Once | ORAL | Status: AC
  Administered 2022-10-27: 1000 mg via ORAL
  Filled 2022-10-27: qty 2

## 2022-10-27 MED ORDER — KETOROLAC TROMETHAMINE 15 MG/ML IJ SOLN
15.0000 mg | Freq: Once | INTRAMUSCULAR | Status: AC
  Administered 2022-10-27: 15 mg via INTRAVENOUS
  Filled 2022-10-27: qty 1

## 2022-10-27 MED ORDER — TAMSULOSIN HCL 0.4 MG PO CAPS
0.4000 mg | ORAL_CAPSULE | Freq: Every day | ORAL | 0 refills | Status: DC
  Filled 2022-10-27: qty 30, 30d supply, fill #0

## 2022-10-27 MED ORDER — MORPHINE SULFATE 15 MG PO TABS
7.5000 mg | ORAL_TABLET | ORAL | 0 refills | Status: DC | PRN
  Filled 2022-10-27: qty 5, 2d supply, fill #0

## 2022-10-27 MED ORDER — SODIUM CHLORIDE 0.9 % IV BOLUS
1000.0000 mL | Freq: Once | INTRAVENOUS | Status: AC
  Administered 2022-10-27: 1000 mL via INTRAVENOUS

## 2022-10-27 NOTE — ED Triage Notes (Signed)
Pt to ED c/o right flank pain, reports currently on ABX- cipro  for UTI, started Friday. HX kidney stones. Reports urge to urinate, but unable to do so.

## 2022-10-27 NOTE — ED Notes (Signed)
Discharge paperwork given and verbally understood. 

## 2022-10-27 NOTE — Discharge Instructions (Addendum)
Take 4 over the counter ibuprofen tablets 3 times a day or 2 over-the-counter naproxen tablets twice a day for pain. Also take tylenol '1000mg'$ (2 extra strength) four times a day.   Then take the pain medicine if you feel like you need it. Narcotics do not help with the pain, they only make you care about it less.  You can become addicted to this, people may break into your house to steal it.  It will constipate you.  If you drive under the influence of this medicine you can get a DUI.    Follow up with urology in the office.  If you have any worsening or uncontrolled discomfort or develop a fever then please go immediately to the [facility] emergency department for evaluation.  The want to try and take your stone out on Thursday if it has not passed they would like you to not have anything to eat or drink that morning.

## 2022-10-27 NOTE — ED Provider Notes (Signed)
[facility] EMERGENCY DEPT Provider Note   CSN: [id number] Arrival date & time: 10/27/22  4315     History  Chief Complaint  Patient presents with   Flank Pain    Guy Moore is a 89 y.o. male.  89 yo M with a chief complaints of right flank pain and pain with urination and increased urinary frequency.  The patient is currently on antibiotics for an Enterobacter urinary tract infection.  He has had some low back pain off and on from chronic back pain but he thinks that this may be a kidney stone.  He denies any fevers or chills.  Does feel like sometimes he has difficulty emptying his bladder.   Flank Pain       Home Medications Prior to Admission medications   Medication Sig Start Date End Date Taking? Authorizing Provider  morphine (MSIR) 15 MG tablet Take 0.5 tablets (7.5 mg total) by mouth every 4 (four) hours as needed for severe pain. 10/27/22  Yes Deno Etienne, DO  ondansetron (ZOFRAN-ODT) 4 MG disintegrating tablet Take 1 tablet (4 mg total) by mouth every 4 (four) hours as needed for nausea/vomiting. 10/27/22  Yes Deno Etienne, DO  tamsulosin (FLOMAX) 0.4 MG CAPS capsule Take 1 capsule (0.4 mg total) by mouth daily after supper. 10/27/22  Yes Deno Etienne, DO  acetaminophen (TYLENOL) 325 MG tablet Take 2 tablets (650 mg total) by mouth every 6 (six) hours as needed for mild pain (or Fever >/= 101). 05/28/21   Elgergawy, Silver Huguenin, MD  albuterol (VENTOLIN HFA) 108 (90 Base) MCG/ACT inhaler USE 2 INHALATIONS EVERY 6 HOURS AS NEEDED FOR WHEEZING OR SHORTNESS OF BREATH 02/03/21   [name], MD  amLODipine (NORVASC) 5 MG tablet TAKE 1 TABLET DAILY 07/29/22   Laurey Morale, MD  BESIVANCE 0.6 % SUSP Place 1 drop into both eyes every 30 (thirty) days. 07/24/20   [provider]  ciprofloxacin (CIPRO) 500 MG tablet Take 1 tablet (500 mg total) by mouth 2 (two) times daily for 10 days. 10/22/22 11/01/22  Laurey Morale, MD  FLOVENT DISKUS 50 MCG/ACT AEPB USE 1  INHALATION TWICE A DAY 01/15/22   [name], MD  hypromellose (GENTEAL) 0.3 % GEL ophthalmic ointment as needed. 06/26/21   [provider]  MAGNESIUM GLUCONATE PO Take 1 tablet by mouth daily.    [provider]  Methylcellulose, Laxative, (CITRUCEL PO) Take 2 tablets by mouth daily after breakfast.    [provider]  Multiple Vitamins-Minerals (PRESERVISION AREDS 2) CAPS Take 1 capsule by mouth 2 (two) times daily.    [provider]  omeprazole (PRILOSEC) 40 MG capsule Take 1 capsule (40 mg total) by mouth daily. 04/30/22   Laurey Morale, MD  potassium chloride SA (KLOR-CON M) 20 MEQ tablet TAKE 1 TABLET DAILY 09/10/22   Laurey Morale, MD  pravastatin (PRAVACHOL) 40 MG tablet TAKE 1 TABLET AT BEDTIME 09/10/22   Laurey Morale, MD  rivaroxaban (XARELTO) 20 MG TABS tablet Take 1 tablet (20 mg total) by mouth daily. 05/08/22   Laurey Morale, MD  valsartan (DIOVAN) 160 MG tablet TAKE 1 TABLET DAILY 07/29/22   Laurey Morale, MD      Allergies    Patient has no known allergies.    Review of Systems   Review of Systems  Genitourinary:  Positive for flank pain.    Physical Exam Updated Vital Signs BP (!) 141/88 (BP Location: Right Arm)   Pulse  76   Temp 97.9 F (36.6 C) (Oral)   Resp 16   Ht '5\' 10"'$  (1.778 m)   Wt 110.2 kg   SpO2 96%   BMI 34.87 kg/m  Physical Exam Vitals and nursing note reviewed.  Constitutional:      Appearance: He is well-developed.  HENT:     Head: Normocephalic and atraumatic.  Eyes:     Pupils: Pupils are equal, round, and reactive to light.  Neck:     Vascular: No JVD.  Cardiovascular:     Rate and Rhythm: Normal rate and regular rhythm.     Heart sounds: No murmur heard.    No friction rub. No gallop.  Pulmonary:     Effort: No respiratory distress.     Breath sounds: No wheezing.  Abdominal:     General: There is no distension.     Tenderness: There is no abdominal tenderness. There is no right CVA  tenderness, left CVA tenderness, guarding or rebound.     Comments: No obvious pain with palpation of the low back.  No CVA tenderness to percussion.  Musculoskeletal:        General: Normal range of motion.     Cervical back: Normal range of motion and neck supple.  Skin:    Coloration: Skin is not pale.     Findings: No rash.  Neurological:     Mental Status: He is alert and oriented to person, place, and time.  Psychiatric:        Behavior: Behavior normal.     ED Results / Procedures / Treatments   Labs (all labs ordered are listed, but only abnormal results are displayed) Labs Reviewed  URINALYSIS, ROUTINE W REFLEX MICROSCOPIC - Abnormal; Notable for the following components:      Result Value   APPearance HAZY (*)    Hgb urine dipstick LARGE (*)    Protein, ur TRACE (*)    Leukocytes,Ua SMALL (*)    RBC / HPF >50 (*)    All other components within normal limits  CBC WITH DIFFERENTIAL/PLATELET - Abnormal; Notable for the following components:   WBC 3.4 (*)    Platelets 108 (*)    Lymphs Abs 0.4 (*)    All other components within normal limits  BASIC METABOLIC PANEL - Abnormal; Notable for the following components:   Glucose, Bld 114 (*)    GFR, Estimated 59 (*)    All other components within normal limits    EKG None  Radiology CT Renal Stone Study  Result Date: 10/27/2022 CLINICAL DATA:  Abdominal and flank pain EXAM: CT ABDOMEN AND PELVIS WITHOUT CONTRAST TECHNIQUE: Multidetector CT imaging of the abdomen and pelvis was performed following the standard protocol without IV contrast. RADIATION DOSE REDUCTION: This exam was performed according to the departmental dose-optimization program which includes automated exposure control, adjustment of the mA and/or kV according to patient size and/or use of iterative reconstruction technique. COMPARISON:  Multiple exams, including 07/22/2020 FINDINGS: Lower chest: Stable right middle lobe scarring. Descending thoracic aortic  atherosclerotic calcification. Small type 1 hiatal hernia. Pacer leads noted. Right coronary artery atherosclerotic calcification implying coronary artery disease. Hepatobiliary: 2 small hypodense lesions favoring cysts are unchanged from the 2021. No further imaging workup of these lesions is indicated. Gallbladder unremarkable. No biliary dilatation. Pancreas: Unremarkable Spleen: Unremarkable Adrenals/Urinary Tract: Mild fullness of the left adrenal gland without mass. Fluid density renal lesions compatible with benign cysts bilaterally. No further imaging workup of these lesions is  indicated. Mild right hydronephrosis and hydroureter extending down to a linear stone or collection of stones at the UVJ measuring 0.9 by 0.2 by 0.3 cm. A nonobstructive right kidney upper pole calculus measures 0.8 cm in long axis on image 54 series 5. No left renal calculus identified. Small peripelvic cysts on the left with benign imaging appearance, not requiring further workup. Stomach/Bowel: Sigmoid colon diverticulosis. Vascular/Lymphatic: Atherosclerosis is present, including aortoiliac atherosclerotic disease. Reproductive: Mild prostatomegaly. Other: No supplemental non-categorized findings. Musculoskeletal: Mild grade 1 degenerative anterolisthesis at L4-5. Lumbar spondylosis and degenerative disc disease. Mild degenerative arthropathy of both hips. IMPRESSION: 1. Mild right hydronephrosis and hydroureter extending down to a linear stone or collection of stones at the right UVJ measuring 0.9 by 0.2 by 0.3 cm. 2. Nonobstructive 0.8 cm right kidney upper pole calculus. 3. Other imaging findings of potential clinical significance: Coronary atherosclerosis. Small type 1 hiatal hernia. Sigmoid colon diverticulosis. Mild prostatomegaly. Lumbar spondylosis and degenerative disc disease. Mild degenerative arthropathy of both hips. Aortic Atherosclerosis (ICD10-I70.0). Electronically Signed   By: Van Clines M.D.   On:  10/27/2022 11:09    Procedures Procedures    Medications Ordered in ED Medications  sodium chloride 0.9 % bolus 1,000 mL (0 mLs Intravenous Stopped 10/27/22 1207)  acetaminophen (TYLENOL) tablet 1,000 mg (1,000 mg Oral Given 10/27/22 1127)  oxyCODONE (Oxy IR/ROXICODONE) immediate release tablet 5 mg (5 mg Oral Given 10/27/22 1127)  ketorolac (TORADOL) 15 MG/ML injection 15 mg (15 mg Intravenous Given 10/27/22 1301)    ED Course/ Medical Decision Making/ A&P                             Medical Decision Making Amount and/or Complexity of Data Reviewed Labs: ordered. Radiology: ordered.  Risk OTC drugs. Prescription drug management.   89 yo M with a chief complaints of right-sided low back pain.  Patient has a history of chronic back pain though he thinks this is different.  He also is being currently treated for a urinary tract infection with ciprofloxacin.  He is worried that he has a kidney stone.  Will obtain a CT stone study.  Blood work.  Treat pain.  Reassess.  CT scan is concerning with multiple stacked kidney stones at the right UVJ.  I discussed the case with Dr. Gloriann Loan, urology will independently interpreted the images, recommends follow up in two days time.  For any worsening return to ED.  3:21 PM:  I have discussed the diagnosis/risks/treatment options with the patient.  Evaluation and diagnostic testing in the emergency department does not suggest an emergent condition requiring admission or immediate intervention beyond what has been performed at this time.  They will follow up with Urology. We also discussed returning to the ED immediately if new or worsening sx occur. We discussed the sx which are most concerning (e.g., sudden worsening pain, fever, inability to tolerate by mouth) that necessitate immediate return. Medications administered to the patient during their visit and any new prescriptions provided to the patient are listed below.  Medications given during this  visit Medications  sodium chloride 0.9 % bolus 1,000 mL (0 mLs Intravenous Stopped 10/27/22 1207)  acetaminophen (TYLENOL) tablet 1,000 mg (1,000 mg Oral Given 10/27/22 1127)  oxyCODONE (Oxy IR/ROXICODONE) immediate release tablet 5 mg (5 mg Oral Given 10/27/22 1127)  ketorolac (TORADOL) 15 MG/ML injection 15 mg (15 mg Intravenous Given 10/27/22 1301)     The patient appears reasonably screen  and/or stabilized for discharge and I doubt any other medical condition or other [facility] requiring further screening, evaluation, or treatment in the ED at this time prior to discharge.          Final Clinical Impression(s) / ED Diagnoses Final diagnoses:  Nephrolithiasis    Rx / DC Orders ED Discharge Orders          Ordered    tamsulosin (FLOMAX) 0.4 MG CAPS capsule  Daily after supper        10/27/22 1243    morphine (MSIR) 15 MG tablet  Every 4 hours PRN        10/27/22 1243    ondansetron (ZOFRAN-ODT) 4 MG disintegrating tablet  Every 4 hours PRN        10/27/22 1243              Deno Etienne, DO 10/27/22 1522

## 2022-10-29 ENCOUNTER — Ambulatory Visit (HOSPITAL_COMMUNITY): Payer: Medicare Other | Admitting: Anesthesiology

## 2022-10-29 ENCOUNTER — Ambulatory Visit (HOSPITAL_BASED_OUTPATIENT_CLINIC_OR_DEPARTMENT_OTHER): Payer: Medicare Other | Admitting: Anesthesiology

## 2022-10-29 ENCOUNTER — Ambulatory Visit (HOSPITAL_COMMUNITY): Payer: Medicare Other

## 2022-10-29 ENCOUNTER — Telehealth: Payer: Self-pay | Admitting: Cardiology

## 2022-10-29 ENCOUNTER — Other Ambulatory Visit (HOSPITAL_BASED_OUTPATIENT_CLINIC_OR_DEPARTMENT_OTHER): Payer: Self-pay

## 2022-10-29 ENCOUNTER — Other Ambulatory Visit: Payer: Self-pay | Admitting: Urology

## 2022-10-29 ENCOUNTER — Encounter (HOSPITAL_COMMUNITY): Payer: Self-pay | Admitting: Urology

## 2022-10-29 ENCOUNTER — Encounter (HOSPITAL_COMMUNITY)
Admission: RE | Disposition: A | Discharge status: Home / Self Care | Payer: Self-pay | Source: Ambulatory Visit | Attending: Urology

## 2022-10-29 ENCOUNTER — Telehealth: Payer: Self-pay | Admitting: Family Medicine

## 2022-10-29 ENCOUNTER — Ambulatory Visit (HOSPITAL_COMMUNITY)
Admission: RE | Admit: 2022-10-29 | Discharge: 2022-10-29 | Disposition: A | Discharge status: Home / Self Care | Payer: Medicare Other | Source: Ambulatory Visit | Attending: Urology | Admitting: Urology

## 2022-10-29 DIAGNOSIS — M47816 Spondylosis without myelopathy or radiculopathy, lumbar region: Secondary | ICD-10-CM | POA: Diagnosis not present

## 2022-10-29 DIAGNOSIS — N281 Cyst of kidney, acquired: Secondary | ICD-10-CM | POA: Diagnosis not present

## 2022-10-29 DIAGNOSIS — I700 Atherosclerosis of aorta: Secondary | ICD-10-CM | POA: Diagnosis not present

## 2022-10-29 DIAGNOSIS — N35919 Unspecified urethral stricture, male, unspecified site: Secondary | ICD-10-CM | POA: Diagnosis not present

## 2022-10-29 DIAGNOSIS — N132 Hydronephrosis with renal and ureteral calculous obstruction: Secondary | ICD-10-CM | POA: Diagnosis not present

## 2022-10-29 DIAGNOSIS — I10 Essential (primary) hypertension: Secondary | ICD-10-CM

## 2022-10-29 DIAGNOSIS — Z87440 Personal history of urinary (tract) infections: Secondary | ICD-10-CM | POA: Diagnosis not present

## 2022-10-29 DIAGNOSIS — Z87891 Personal history of nicotine dependence: Secondary | ICD-10-CM | POA: Insufficient documentation

## 2022-10-29 DIAGNOSIS — K449 Diaphragmatic hernia without obstruction or gangrene: Secondary | ICD-10-CM | POA: Insufficient documentation

## 2022-10-29 DIAGNOSIS — E119 Type 2 diabetes mellitus without complications: Secondary | ICD-10-CM | POA: Diagnosis not present

## 2022-10-29 DIAGNOSIS — N200 Calculus of kidney: Secondary | ICD-10-CM | POA: Diagnosis not present

## 2022-10-29 DIAGNOSIS — N202 Calculus of kidney with calculus of ureter: Secondary | ICD-10-CM | POA: Diagnosis not present

## 2022-10-29 DIAGNOSIS — G4730 Sleep apnea, unspecified: Secondary | ICD-10-CM | POA: Diagnosis not present

## 2022-10-29 DIAGNOSIS — K5730 Diverticulosis of large intestine without perforation or abscess without bleeding: Secondary | ICD-10-CM | POA: Insufficient documentation

## 2022-10-29 DIAGNOSIS — N35911 Unspecified urethral stricture, male, meatal: Secondary | ICD-10-CM | POA: Diagnosis not present

## 2022-10-29 DIAGNOSIS — N201 Calculus of ureter: Secondary | ICD-10-CM

## 2022-10-29 DIAGNOSIS — I4891 Unspecified atrial fibrillation: Secondary | ICD-10-CM | POA: Insufficient documentation

## 2022-10-29 DIAGNOSIS — N400 Enlarged prostate without lower urinary tract symptoms: Secondary | ICD-10-CM | POA: Diagnosis not present

## 2022-10-29 DIAGNOSIS — Z87442 Personal history of urinary calculi: Secondary | ICD-10-CM | POA: Diagnosis not present

## 2022-10-29 DIAGNOSIS — I2510 Atherosclerotic heart disease of native coronary artery without angina pectoris: Secondary | ICD-10-CM | POA: Insufficient documentation

## 2022-10-29 DIAGNOSIS — N4889 Other specified disorders of penis: Secondary | ICD-10-CM | POA: Diagnosis not present

## 2022-10-29 DIAGNOSIS — J45909 Unspecified asthma, uncomplicated: Secondary | ICD-10-CM | POA: Insufficient documentation

## 2022-10-29 DIAGNOSIS — N130 Hydronephrosis with ureteropelvic junction obstruction: Secondary | ICD-10-CM | POA: Diagnosis not present

## 2022-10-29 HISTORY — PX: CYSTOSCOPY WITH URETEROSCOPY AND STENT PLACEMENT: SHX6377

## 2022-10-29 HISTORY — DX: Other complications of anesthesia, initial encounter: T88.59XA

## 2022-10-29 LAB — GLUCOSE, CAPILLARY: Glucose-Capillary: 113 mg/dL — ABNORMAL HIGH (ref 70–99)

## 2022-10-29 SURGERY — CYSTOURETEROSCOPY, WITH STENT INSERTION
Anesthesia: General | Laterality: Right

## 2022-10-29 MED ORDER — LACTATED RINGERS IV SOLN: INTRAVENOUS | Status: DC

## 2022-10-29 MED ORDER — DEXAMETHASONE SODIUM PHOSPHATE 4 MG/ML IJ SOLN
INTRAMUSCULAR | Status: DC | PRN
  Administered 2022-10-29: 4 mg via INTRAVENOUS

## 2022-10-29 MED ORDER — CEFAZOLIN SODIUM-DEXTROSE 2-4 GM/100ML-% IV SOLN
INTRAVENOUS | Status: AC
  Filled 2022-10-29: qty 100

## 2022-10-29 MED ORDER — FENTANYL CITRATE (PF) 100 MCG/2ML IJ SOLN
INTRAMUSCULAR | Status: AC
  Filled 2022-10-29: qty 2

## 2022-10-29 MED ORDER — FENTANYL CITRATE PF 50 MCG/ML IJ SOSY: 25.0000 ug | PREFILLED_SYRINGE | INTRAMUSCULAR | Status: DC | PRN

## 2022-10-29 MED ORDER — FENTANYL CITRATE (PF) 100 MCG/2ML IJ SOLN
INTRAMUSCULAR | Status: DC | PRN
  Administered 2022-10-29: 25 ug via INTRAVENOUS
  Administered 2022-10-29: 50 ug via INTRAVENOUS
  Administered 2022-10-29: 25 ug via INTRAVENOUS

## 2022-10-29 MED ORDER — DOCUSATE SODIUM 100 MG PO CAPS: 100.0000 mg | ORAL_CAPSULE | Freq: Every day | ORAL | 0 refills | Status: DC | PRN

## 2022-10-29 MED ORDER — CHLORHEXIDINE GLUCONATE 0.12 % MT SOLN
15.0000 mL | Freq: Once | OROMUCOSAL | Status: AC
  Administered 2022-10-29: 15 mL via OROMUCOSAL

## 2022-10-29 MED ORDER — CIPROFLOXACIN HCL 500 MG PO TABS
500.0000 mg | ORAL_TABLET | Freq: Two times a day (BID) | ORAL | 0 refills | Status: DC
  Filled 2022-10-29: qty 10, 5d supply, fill #0

## 2022-10-29 MED ORDER — ONDANSETRON HCL 4 MG/2ML IJ SOLN
INTRAMUSCULAR | Status: DC | PRN
  Administered 2022-10-29: 4 mg via INTRAVENOUS

## 2022-10-29 MED ORDER — CEFAZOLIN SODIUM-DEXTROSE 2-4 GM/100ML-% IV SOLN
2.0000 g | INTRAVENOUS | Status: AC
  Administered 2022-10-29: 2 g via INTRAVENOUS

## 2022-10-29 MED ORDER — SODIUM CHLORIDE 0.9 % IR SOLN
Status: DC | PRN
  Administered 2022-10-29: 1000 mL
  Administered 2022-10-29: 3000 mL

## 2022-10-29 MED ORDER — PROPOFOL 10 MG/ML IV BOLUS
INTRAVENOUS | Status: DC | PRN
  Administered 2022-10-29: 160 mg via INTRAVENOUS

## 2022-10-29 MED ORDER — ACETAMINOPHEN 325 MG PO TABS: 325.0000 mg | ORAL_TABLET | ORAL | Status: DC | PRN

## 2022-10-29 MED ORDER — EPHEDRINE 5 MG/ML INJ
INTRAVENOUS | Status: AC
  Filled 2022-10-29: qty 5

## 2022-10-29 MED ORDER — LIDOCAINE HCL (CARDIAC) PF 100 MG/5ML IV SOSY
PREFILLED_SYRINGE | INTRAVENOUS | Status: DC | PRN
  Administered 2022-10-29: 80 mg via INTRAVENOUS

## 2022-10-29 MED ORDER — OXYCODONE HCL 5 MG/5ML PO SOLN: 5.0000 mg | Freq: Once | ORAL | Status: DC | PRN

## 2022-10-29 MED ORDER — ORAL CARE MOUTH RINSE: 15.0000 mL | Freq: Once | OROMUCOSAL | Status: AC

## 2022-10-29 MED ORDER — CIPROFLOXACIN HCL 500 MG PO TABS: 500.0000 mg | ORAL_TABLET | Freq: Two times a day (BID) | ORAL | 0 refills | Status: AC

## 2022-10-29 MED ORDER — ACETAMINOPHEN 10 MG/ML IV SOLN: 1000.0000 mg | Freq: Once | INTRAVENOUS | Status: DC | PRN

## 2022-10-29 MED ORDER — AMISULPRIDE (ANTIEMETIC) 5 MG/2ML IV SOLN: 10.0000 mg | Freq: Once | INTRAVENOUS | Status: DC | PRN

## 2022-10-29 MED ORDER — PROMETHAZINE HCL 25 MG/ML IJ SOLN: 6.2500 mg | INTRAMUSCULAR | Status: DC | PRN

## 2022-10-29 MED ORDER — OXYCODONE-ACETAMINOPHEN 5-325 MG PO TABS: 1.0000 | ORAL_TABLET | ORAL | 0 refills | Status: DC | PRN

## 2022-10-29 MED ORDER — CIPROFLOXACIN HCL 500 MG PO TABS: 500.0000 mg | ORAL_TABLET | Freq: Two times a day (BID) | ORAL | 0 refills | Status: DC

## 2022-10-29 MED ORDER — EPHEDRINE SULFATE (PRESSORS) 50 MG/ML IJ SOLN
INTRAMUSCULAR | Status: DC | PRN
  Administered 2022-10-29 (×2): 5 mg via INTRAVENOUS

## 2022-10-29 MED ORDER — OXYCODONE-ACETAMINOPHEN 5-325 MG PO TABS
1.0000 | ORAL_TABLET | ORAL | 0 refills | Status: DC | PRN
  Filled 2022-10-29: qty 18, 3d supply, fill #0

## 2022-10-29 MED ORDER — OXYCODONE HCL 5 MG PO TABS: 5.0000 mg | ORAL_TABLET | Freq: Once | ORAL | Status: DC | PRN

## 2022-10-29 MED ORDER — ACETAMINOPHEN 160 MG/5ML PO SOLN: 325.0000 mg | ORAL | Status: DC | PRN

## 2022-10-29 MED ORDER — IOHEXOL 300 MG/ML  SOLN
INTRAMUSCULAR | Status: DC | PRN
  Administered 2022-10-29: 50 mL

## 2022-10-29 MED ORDER — DOCUSATE SODIUM 100 MG PO CAPS
100.0000 mg | ORAL_CAPSULE | Freq: Every day | ORAL | 0 refills | Status: DC | PRN
  Filled 2022-10-29: qty 30, 30d supply, fill #0

## 2022-10-29 SURGICAL SUPPLY — 22 items
BAG URO CATCHER STRL LF (MISCELLANEOUS) ×1 IMPLANT
CATH URETL OPEN END 6FR 70 (CATHETERS) IMPLANT
CLOTH BEACON ORANGE TIMEOUT ST (SAFETY) ×1 IMPLANT
EXTRACTOR STONE NITINOL NGAGE (UROLOGICAL SUPPLIES) IMPLANT
GLOVE BIOGEL M 7.0 STRL (GLOVE) ×1 IMPLANT
GOWN STRL REUS W/ TWL XL LVL3 (GOWN DISPOSABLE) ×1 IMPLANT
GOWN STRL REUS W/TWL XL LVL3 (GOWN DISPOSABLE) ×1
GUIDEWIRE ANG ZIPWIRE 038X150 (WIRE) IMPLANT
GUIDEWIRE STR DUAL SENSOR (WIRE) ×2 IMPLANT
IV NS 1000ML (IV SOLUTION) ×1
IV NS 1000ML BAXH (IV SOLUTION) ×1 IMPLANT
LASER FIB FLEXIVA PULSE ID 365 (Laser) IMPLANT
MANIFOLD NEPTUNE II (INSTRUMENTS) ×1 IMPLANT
PACK CYSTO (CUSTOM PROCEDURE TRAY) ×1 IMPLANT
SHEATH NAVIGATOR HD 11/13X28 (SHEATH) IMPLANT
SHEATH NAVIGATOR HD 11/13X36 (SHEATH) IMPLANT
SHEATH NAVIGATOR HD 12/14X46 (SHEATH) IMPLANT
STENT CONTOUR 6FRX26X.038 (STENTS) IMPLANT
TRACTIP FLEXIVA PULS ID 200XHI (Laser) IMPLANT
TRACTIP FLEXIVA PULSE ID 200 (Laser)
TUBING CONNECTING 10 (TUBING) ×1 IMPLANT
TUBING UROLOGY SET (TUBING) ×1 IMPLANT

## 2022-10-29 NOTE — Telephone Encounter (Signed)
I spoke with patient to change time on his  AWV.  Patient wanted to let you know he is on his way to [place] to have a stent put in.  Then he hung up before I could get any more information

## 2022-10-29 NOTE — Transfer of Care (Signed)
Immediate Anesthesia Transfer of Care Note  Patient: Guy Moore  Procedure(s) Performed: Procedure(s): CYSTOSCOPY WITH RETROGRADE WITH URETHERAL DILITATIONRIGHT DIAGNOSTIC  URETEROSCOPY AND STENT PLACEMENT (Right)  Patient Location: PACU  Anesthesia Type:General  Level of Consciousness:  sedated, patient cooperative and responds to stimulation  Airway & Oxygen Therapy:Patient Spontanous Breathing and Patient connected to face mask oxgen  Post-op Assessment:  Report given to PACU RN and Post -op Vital signs reviewed and stable  Post vital signs:  Reviewed and stable  Last Vitals:  Vitals:   10/29/22 1044  BP: 139/74  Pulse: 79  Resp: 16  Temp: 37.1 C  SpO2: 30%    Complications: No apparent anesthesia complications

## 2022-10-29 NOTE — Discharge Instructions (Signed)
[facility] [phone] Post Ureteroscopy With or Without Stent Instructions  Definitions:  Ureter: The duct that transports urine from the kidney to the bladder. Stent:   A plastic hollow tube that is placed into the ureter, from the kidney to the bladder to prevent the ureter from swelling shut.  GENERAL INSTRUCTIONS:  Despite the fact that no skin incisions were used, the area around the ureter and bladder is raw and irritated. The stent is a foreign body which will further irritate the bladder wall. This irritation is manifested by increased frequency of urination, both day and night, and by an increase in the urge to urinate. In some, the urge to urinate is present almost always. Sometimes the urge is strong enough that you may not be able to stop yourself from urinating. The only real cure is to remove the stent and then give time for the bladder wall to heal which can't be done until the danger of the ureter swelling shut has passed, which varies.  You may see some blood in your urine while the stent is in place and a few days afterwards. Do not be alarmed, even if the urine was clear for a while. Get off your feet and drink lots of fluids until clearing occurs. If you start to pass clots or don't improve, call us.  DIET: You may return to your normal diet immediately. Because of the raw surface of your bladder, alcohol, spicy foods, acid type foods and drinks with caffeine may cause irritation or frequency and should be used in moderation. To keep your urine flowing freely and to avoid constipation, drink plenty of fluids during the day ( 8-10 glasses ). Tip: Avoid cranberry juice because it is very acidic.  ACTIVITY: Your physical activity doesn't need to be restricted. However, if you are very active, you may see some blood in your urine. We suggest that you reduce your activity under these circumstances until the bleeding has stopped.  BOWELS: It is important to  keep your bowels regular during the postoperative period. Straining with bowel movements can cause bleeding. A bowel movement every other day is reasonable. Use a mild laxative if needed, such as Milk of Magnesia 2-3 tablespoons, or 2 Dulcolax tablets. Call if you continue to have problems. If you have been taking narcotics for pain, before, during or after your surgery, you may be constipated. Take a laxative if necessary.   MEDICATION: You should resume your pre-surgery medications unless told not to. In addition you will often be given an antibiotic to prevent infection. These should be taken as prescribed until the bottles are finished unless you are having an unusual reaction to one of the drugs.  PROBLEMS YOU SHOULD REPORT TO [place]: Fevers over 100.5 Fahrenheit. Heavy bleeding, or clots ( See above notes about blood in urine ). Inability to urinate. Drug reactions ( hives, rash, nausea, vomiting, diarrhea ). Severe burning or pain with urination that is not improving.  FOLLOW-UP: You will need a follow-up appointment to monitor your progress. Call for this appointment at the number listed above. Usually the first appointment will be about three to fourteen days after your surgery.   You have a stent draining your kidney. This will remain in place until next surgery.  You have a foley catheter draining your bladder. This may be removed on Monday AM with provided syringe. If any issues, please call the office.

## 2022-10-29 NOTE — Telephone Encounter (Signed)
   Pre-operative Risk Assessment    Patient Name: Guy Moore  DOB: [birth year] MRN: [id number]      Request for Surgical Clearance    Procedure:   cystoscopy and right retrograde pyelogram with a right stint laser lithotripsy   Date of Surgery:  Clearance 11/23/21                                 Surgeon:  Dr. Vevelyn Royals Group or Practice Name:  [facility] Phone number:  [phone] Fax number:  [phone]   Type of Clearance Requested:   - Medical  - Pharmacy:  Hold Rivaroxaban (Xarelto) 3 days   Type of Anesthesia:  General    Additional requests/questions:      [name]   10/29/2022, 11:20 AM

## 2022-10-29 NOTE — Op Note (Signed)
Operative Note  Preoperative diagnosis:  1.  Right distal ureteral stone  Postoperative diagnosis: 1.  Right distal ureteral stone 2. Urethral stricture 3. Glans lesion 4. BPH  Procedure(s): Exam under anesthesia 2.  Cystoscopy 3. Urethral dilation 4. Right retrograde pyelogram with interpretation 5. Right diagnostic ureteroscopy 6. Right ureteral stent placement  7. Fluoroscopy <1 hour with intraoperative interpretation  Surgeon: Rexene Alberts, MD  Assistants:  None  Anesthesia:  General  Complications:  None  EBL:  Minimal  Specimens: 1.  ID Type Source Tests Collected by Time Destination  A : right renal pelvis urine Urine Urine, Cystoscope URINE CULTURE Janith Lima, MD 10/29/2022 1348    Drains/Catheters: 1.  Right 6Fr x 26cm ureteral sten without tether 2. 16 Fr foley catheter  Intraoperative findings:   Exam under anesthesia demonstrated raised suspicious erythematous lesion encompassing both aspects of the glans and overlying the meatus.  Meatus initially was pinpoint. Dilation of the urethra was accomplished using [employer] sounds from 8 [place] to 22 [place]. Cystoscopy demonstrated no further urethral narrowing other than at the meatus, bilobar obstructing prostate, significant inflammation overlying the right-sided trigone.  Significant 4+ bladder trabeculation and debris in the bladder consistent with cystitis. Right diagnostic ureteroscopy with approximate 1 cm distal right ureteral stone. Right retrograde pyelogram with moderate right-sided hydronephrosis. Successful right ureteral stent placement.  Indication:  Guy Moore is a 89 y.o. male with complaints of right-sided flank pain.  He presented the ED and CT A/P demonstrated 4 mm distal right ureteral stone.  He was found amateur 100.4 that is defervescing and has had a urinary tract infection in the past week.  After reviewing the management options for treatment, he elected to proceed with the above  surgical procedure(s). We have discussed the potential benefits and risks of the procedure, side effects of the proposed treatment, the likelihood of the patient achieving the goals of the procedure, and any potential problems that might occur during the procedure or recuperation. Informed consent has been obtained.  Description of procedure: The patient was taken to the operating room and general anesthesia was induced.  The patient was placed in the dorsal lithotomy position, prepped and draped in the usual sterile fashion, and preoperative antibiotics were administered. A preoperative time-out was performed.   Exam under anesthesia was performed and identified suspicious erythematous raised lesion on his glans.  Of note, the patient had not notified me of this area and after talking with his wife, she was not aware of this either.  The meatus was initially pinpoint I had to dilate this initially using a wire and then [employer] sounds dilating up to 22 [place].  I then passed a cystoscope through the urethra noting no further strictures.  He did have bilobar obstructing prostate.  I entered his bladder and he had significant bladder trabeculation.  I irrigated his bladder several times to remove debris present.  He had significant inflammation overlying the right side of his trigone.  It is initially difficult to identify the ureteral orifice.  I attempted to pass a wire in several locations and this would not pass up his ureter due to J hooking.  I then had to introduced a semirigid ureteroscope and advanced this into the distal ureter.  Identified approximately 1 cm impacted stone in this location.  I was able to navigate a 0.038 sensor wire up to the level of the renal pelvis.  Over this wire I then passed a fragment open-ended catheter  performed right retrograde pyelogram demonstrating moderate right-sided hydronephrosis.  Prior to obtaining this, I did obtain right renal pelvis at this for urine culture.   Wire was replaced and cystoscope was then reintroduced and a 6 [place] by 26 cm right ureteral stent was placed without a tether string.  His bladder then drained.  Given that I had to dilate his urethra, elected a 31 French Foley catheter.  Also given that we had not previously discussed his penile lesion, elected not to biopsy at this time.  He will return to the operating couple weeks to treat his stent at that time, will consider biopsy after discussing with him prior.  The bladder was then emptied and the procedure ended.  The patient appeared to tolerate the procedure well and without complications.  The patient was able to be awakened and transferred to the recovery unit in satisfactory condition.   Plan: He will follow-up with me in the office next week to further discuss.  Will plan for ureteroscopy with laser lithotripsy after completing course of antibiotics.  We also consider renal biopsy after further discussing his penile lesion.  Operative findings were discussed with his wife.  He will be discharged home on ciprofloxacin.  We will follow-up renal pelvis urine culture to ensure sensitive.  Guy Moore [facility]  Pager: [phone]

## 2022-10-29 NOTE — H&P (Signed)
Office Visit Report     10/29/2022   --------------------------------------------------------------------------------   Guy Moore. Lamar  MRN: [id number]  DOB: September 23, 1934, 89 year old Male  SSN:    PRIMARY CARE:  Annie Main A. Sarajane Jews, MD  PRIMARY CARE FAX:  [phone]  REFERRING:  Janith Lima, MD  PROVIDER:  Rexene Alberts, M.D.  LOCATION:  [facility] [phone]     --------------------------------------------------------------------------------   CC/HPI: Guy Moore is an 89 year old male seen as a work in today for right ureteral stone and sign infection.   1. Right ureteral stone:  -He has a history of urolithiasis with CT in 07/2021 demonstrating nonobstructing right renal stones measuring 3 mm and 2 mm.  -He developed acute onset right-sided flank pain presented to ED and CT A/P 10/27/2022 revealed a 9 mm x 2 x 3 mm linear stone collection at the distal right ureter with mild right hydronephrosis. He also had an 8 mm right upper pole stone. Of note, few days prior, he was diagnosed with UTI.  -Urine culture 10/22/2022 revealed Enterobacter cloacae a greater than 100,000 and he has been taking ciprofloxacin.  -He denies fevers at home. Temperature today is 100.4. He does note some mild dysuria.   #2Johney Maine hematuria: Resolved. Negative evaluation in 06/2020 other than urolithiasis as above. Cystoscopy without suspicious bladder lesions in 06/2020. He does have a smoking history smoking 1 pack/day for 15 years. He quit at age 53. His father had bladder cancer.   He is a Agricultural consultant.     ALLERGIES: NKDA    MEDICATIONS: Omeprazole  Albuterol Sulfate  Amlodipine Besilate  CIPROFLOXACIN Daily  Fluticasone Propionate  Magnesium Gluconate  Methylcellulose  Multivitamin  Potassium Chloride  Pravastatin Sodium  Tylenol  Valsartan     GU PSH: Cystoscopy - 08/15/2020 Locm 300-'399Mg'$ /Ml Iodine,1Ml - 07/22/2020 Vasectomy     NON-GU PSH: Excise Uvula      GU PMH: Renal calculus - 08/20/2022, - 08/19/2021, - 08/15/2020 Renal cyst - 08/19/2021, - 08/15/2020 Balanitis - 08/15/2020 Gross hematuria - 08/15/2020, - 07/22/2020, - 07/17/2020 Dysuria - 07/17/2020 Urinary Frequency - 07/17/2020    NON-GU PMH: Arrhythmia Arthritis Asthma Atrial Fibrillation GERD Presence of cardiac pacemaker    FAMILY HISTORY: 2 sons - Runs in Family 3 daughters - Runs in Family Bladder Cancer - Father Heart Attack - Mother   SOCIAL HISTORY: Marital Status: Married Ethnicity: Not Hispanic Or Latino; Race: White Current Smoking Status: Patient does not smoke anymore. Has not smoked since 07/13/1975.   Tobacco Use Assessment Completed: Used Tobacco in last 30 days? Does not use smokeless tobacco. Drinks 2 drinks per week. Light Drinker.  Does not use drugs. Drinks 2 caffeinated drinks per day.    REVIEW OF SYSTEMS:    GU Review Male:   Patient denies frequent urination, hard to postpone urination, burning/ pain with urination, get up at night to urinate, leakage of urine, stream starts and stops, trouble starting your stream, have to strain to urinate , erection problems, and penile pain.  Gastrointestinal (Upper):   Patient denies nausea, vomiting, and indigestion/ heartburn.  Gastrointestinal (Lower):   Patient denies diarrhea and constipation.  Constitutional:   Patient denies fever, night sweats, weight loss, and fatigue.  Skin:   Patient denies skin rash/ lesion and itching.  Eyes:   Patient denies blurred vision and double vision.  Ears/ Nose/ Throat:   Patient denies sore throat and sinus problems.  Hematologic/Lymphatic:   Patient denies swollen glands and  easy bruising.  Cardiovascular:   Patient denies leg swelling and chest pains.  Respiratory:   Patient denies cough and shortness of breath.  Endocrine:   Patient denies excessive thirst.  Musculoskeletal:   Patient denies back pain and joint pain.  Neurological:   Patient denies headaches and  dizziness.  Psychologic:   Patient denies depression and anxiety.   VITAL SIGNS:      10/29/2022 08:13 AM  Weight 240 lb / 108.86 kg  Height 70 in / 177.8 cm  BP 164/82 mmHg  Pulse 89 /min  Temperature 100.4 F / 38 C  BMI 34.4 kg/m   MULTI-SYSTEM PHYSICAL EXAMINATION:    Constitutional: Well-nourished. No physical deformities. Normally developed. Good grooming.  Respiratory: No labored breathing, no use of accessory muscles.   Cardiovascular: Normal temperature, normal extremity pulses, no swelling, no varicosities.  Gastrointestinal: No mass, no tenderness, no rigidity, non obese abdomen.     Complexity of Data:  Source Of History:  Patient, Medical Record Summary  Records Review:   Previous Doctor Records, Previous Hospital Records  X-Ray Review: C.T. Abdomen/Pelvis: Reviewed Films. Reviewed Report. Discussed With Patient.    Notes:                     CLINICAL DATA: Abdominal and flank pain   EXAM:  CT ABDOMEN AND PELVIS WITHOUT CONTRAST   TECHNIQUE:  Multidetector CT imaging of the abdomen and pelvis was performed  following the standard protocol without IV contrast.   RADIATION DOSE REDUCTION: This exam was performed according to the  departmental dose-optimization program which includes automated  exposure control, adjustment of the mA and/or kV according to  patient size and/or use of iterative reconstruction technique.   COMPARISON: Multiple exams, including 07/22/2020   FINDINGS:  Lower chest: Stable right middle lobe scarring. Descending thoracic  aortic atherosclerotic calcification. Small type 1 hiatal hernia.  Pacer leads noted. Right coronary artery atherosclerotic  calcification implying coronary artery disease.   Hepatobiliary: 2 small hypodense lesions favoring cysts are  unchanged from the 2021. No further imaging workup of these lesions  is indicated. Gallbladder unremarkable. No biliary dilatation.   Pancreas: Unremarkable   Spleen:  Unremarkable   Adrenals/Urinary Tract: Mild fullness of the left adrenal gland  without mass. Fluid density renal lesions compatible with benign  cysts bilaterally. No further imaging workup of these lesions is  indicated.   Mild right hydronephrosis and hydroureter extending down to a linear  stone or collection of stones at the UVJ measuring 0.9 by 0.2 by 0.3  cm.   A nonobstructive right kidney upper pole calculus measures 0.8 cm in  long axis on image 54 series 5. No left renal calculus identified.   Small peripelvic cysts on the left with benign imaging appearance,  not requiring further workup.   Stomach/Bowel: Sigmoid colon diverticulosis.   Vascular/Lymphatic: Atherosclerosis is present, including aortoiliac  atherosclerotic disease.   Reproductive: Mild prostatomegaly.   Other: No supplemental non-categorized findings.   Musculoskeletal: Mild grade 1 degenerative anterolisthesis at L4-5.  Lumbar spondylosis and degenerative disc disease. Mild degenerative  arthropathy of both hips.   IMPRESSION:  1. Mild right hydronephrosis and hydroureter extending down to a  linear stone or collection of stones at the right UVJ measuring 0.9  by 0.2 by 0.3 cm.  2. Nonobstructive 0.8 cm right kidney upper pole calculus.  3. Other imaging findings of potential clinical significance:  Coronary atherosclerosis. Small type 1 hiatal hernia. Sigmoid colon  diverticulosis. Mild prostatomegaly. Lumbar spondylosis and  degenerative disc disease. Mild degenerative arthropathy of both  hips.   Aortic Atherosclerosis (ICD10-I70.0).    Electronically Signed  By: Van Clines M.D.  On: 10/27/2022 11:09   PROCEDURES:          Urinalysis w/Scope Dipstick Dipstick Cont'd Micro  Color: Brown Bilirubin: Neg mg/dL WBC/hpf: 0 - 5/hpf  Appearance: Cloudy Ketones: Trace mg/dL RBC/hpf: >60/hpf  Specific Gravity: 1.025 Blood: 3+ ery/uL Bacteria: Rare (0-9/hpf)  pH: <=5.0 Protein: 2+  mg/dL Cystals: Amorph Urates  Glucose: Neg mg/dL Urobilinogen: 0.2 mg/dL Casts: NS (Not Seen)    Nitrites: Neg Trichomonas: Not Present    Leukocyte Esterase: Trace leu/uL Mucous: Not Present      Epithelial Cells: 0 - 5/hpf      Yeast: NS (Not Seen)      Sperm: Not Present    ASSESSMENT:      ICD-10 Details  1 GU:   Renal calculus - N20.0   2   Ureteral calculus - N20.1   3   Renal cyst - N28.1    PLAN:           Document Letter(s):  Created for Patient: Clinical Summary         Notes:   1. Right ureteral stone with sign of infection:  -Urine culture 10/22/2022 with greater than 100,000 Enterobacter  -CT A/P 10/27/2022 with distal right ureteral stone measuring 9 mm x 2 x 3 mm. Also present is 8 mm right upper pole stone.  -We discussed options and we agreed that proceeding with stent placement today is most cautious option. Will consider observation overnight versus discharge home.  -I will prescribe additional antibiotics  -Discussed that he will require right ureteroscopy with laser lithotripsy in about 2 weeks after recovering from infection.   We discussed the options for management of kidney stones, including observation, ESWL, ureteroscopy with laser lithotripsy, and PCNL. The risks and benefits of each option were discussed.  For observation I described the risks which include but are not limited to silent renal damage, life-threatening infection, need for emergent surgery, failure to pass stone, and pain.   ESWL: risks and benefits of ESWL were outlined including infection, bleeding, pain, steinstrasse, kidney injury, need for ancillary treatments, and global anesthesia risks including but not limited to CVA, MI, DVT, PE, pneumonia, and death.   Ureteroscopy: risks and benefits of ureteroscopy were outlined, including infection, bleeding, pain, temporary ureteral stent and associated stent bother, ureteral injury, ureteral stricture, need for ancillary treatments, and global  anesthesia risks including but not limited to CVA, MI, DVT, PE, pneumonia, and death.   #2Johney Maine hematuria: Isolated episode in 06/2020. Negative evaluation other than renal stones. No recent episodes.   3. Bilateral renal cysts: Simple cyst on CT A/P 07/22/2020. No need for continued surveillance.   CC: Alysia Penna        Next Appointment:      Next Appointment: 11/09/2022 11:00 AM    Appointment Type: Postoperative Appointment    Location: [facility] [phone]    Provider: Rexene Alberts, M.D.    Reason for Visit: PO    Urology Preoperative H&P   Chief Complaint: Right ureteral stone  History of Present Illness: KARMINE KAUER is a 89 y.o. male with a right ureteral stone here for cysto, R stent. Had a temp of 100.4, mild dysuria.     Past Medical History:  Diagnosis Date   Allergic rhinitis  Arthritis    Asthma    Atrial fibrillation ([place])    sees Dr. [name] at [facility]    Atrial flutter ([place])    BCC (basal cell carcinoma of skin)    Nose   Complication of anesthesia    COPD with emphysema ([place])    sees Dr. [name]    Depression    Diabetes mellitus ([place])    Diet control , hyperglycemic but not diabetic per Pt 10/2022   Diverticulosis    Erosive esophagitis    Esophageal stricture    Gastric ulcer    Hearing loss    uses amplification   Hemorrhoids    Hiatal hernia    HTN (hypertension)    Hyperlipidemia    Knee problem    2% permanent partial impairment Right   Macular degeneration    Perineal abscess    Pneumonia    Sleep apnea     Past Surgical History:  Procedure Laterality Date   CARDIAC ELECTROPHYSIOLOGY STUDY AND ABLATION     x 4 (sees Dr. [name] at [facility])   [place]  06/13/1979   VASECTOMY  54/62/7035   w/complications    Allergies: No Known Allergies  Family History  Problem Relation Age of Onset   Stroke  Mother    Breast cancer Mother    Heart attack Mother    Other Mother        brain tunor   Heart disease Father    Hypertension Father    Bladder Cancer Father    Parkinson's disease Father    Stroke Father    Breast cancer Brother    Atrial fibrillation Brother    Tremor Brother    Stroke Brother    Hypertension Brother    Breast cancer Maternal Grandmother    Heart disease Maternal Grandmother    Heart disease Maternal Grandfather    Arthritis Daughter    Heart disease Daughter    Colitis Daughter        Lymphocytic   Colon cancer Neg Hx     Social History:  reports that he quit smoking about 45 years ago. His smoking use included cigarettes. He has a 28.00 pack-year smoking history. He has never used smokeless tobacco. He reports that he does not currently use alcohol. He reports that he does not use drugs.  ROS: A complete review of systems was performed.  All systems are negative except for pertinent findings as noted.  Physical Exam:  Vital signs in last 24 hours: Temp:  [98.8 F (37.1 C)] 98.8 F (37.1 C) (01/18 1044) Pulse Rate:  [79] 79 (01/18 1044) Resp:  [16] 16 (01/18 1044) BP: (139)/(74) 139/74 (01/18 1044) SpO2:  [97 %] 97 % (01/18 1044) Weight:  [109.5 kg] 109.5 kg (01/18 1044) Constitutional:  Alert and oriented, No acute distress Cardiovascular: Regular rate and rhythm Respiratory: Normal respiratory effort, Lungs clear bilaterally GI: Abdomen is soft, nontender, nondistended, no abdominal masses GU: No CVA tenderness Lymphatic: No lymphadenopathy Neurologic: Grossly intact, no focal deficits Psychiatric: Normal mood and affect  Laboratory Data:  Recent Labs    10/27/22 1030  WBC 3.4*  HGB 14.4  HCT 44.1  PLT 108*    Recent Labs    10/27/22 1030  NA 142  K 4.2  CL 105  GLUCOSE 114*  BUN 23  CALCIUM 9.3  CREATININE 1.20  No results found for this or any previous visit (from the past 24 hour(s)). Recent Results (from the past  240 hour(s))  Urine Culture     Status: Abnormal   Collection Time: 10/22/22 11:25 AM   Specimen: Urine  Result Value Ref Range Status   MICRO NUMBER: [id number]  Final   SPECIMEN QUALITY: Adequate  Final   Sample Source URINE  Final   STATUS: FINAL  Final   ISOLATE 1: Enterobacter cloacae complex (A)  Final    Comment: Greater than 100,000 CFU/mL of Enterobacter cloacae complex      Susceptibility   Enterobacter cloacae complex - URINE CULTURE, REFLEX    AMOX/CLAVULANIC 8 Resistant     CEFAZOLIN* >=64 Resistant      * For uncomplicated UTI caused by E. coli, K. pneumoniae or P. mirabilis: Cefazolin is susceptible if MIC <32 mcg/mL and predicts susceptible to the oral agents cefaclor, cefdinir, cefpodoxime, cefprozil, cefuroxime, cephalexin and loracarbef.     CEFTAZIDIME <=1 Sensitive     CEFEPIME <=1 Sensitive     CEFTRIAXONE <=1 Sensitive     CIPROFLOXACIN <=0.25 Sensitive     LEVOFLOXACIN <=0.12 Sensitive     GENTAMICIN <=1 Sensitive     IMIPENEM 1 Sensitive     NITROFURANTOIN 64 Intermediate     PIP/TAZO <=4 Sensitive     TOBRAMYCIN <=1 Sensitive     TRIMETH/SULFA* <=20 Sensitive      * For uncomplicated UTI caused by E. coli, K. pneumoniae or P. mirabilis: Cefazolin is susceptible if MIC <32 mcg/mL and predicts susceptible to the oral agents cefaclor, cefdinir, cefpodoxime, cefprozil, cefuroxime, cephalexin and loracarbef. Legend: S = Susceptible  I = Intermediate R = Resistant  NS = Not susceptible * = Not tested  NR = Not reported **NN = See antimicrobic comments     Renal Function: Recent Labs    10/27/22 1030  CREATININE 1.20   Estimated Creatinine Clearance: 54.8 mL/min (by C-G formula based on SCr of 1.2 mg/dL).  Radiologic Imaging: No results found.  I independently reviewed the above imaging studies.  Assessment and Plan DEWAN EMOND is a 89 y.o. male with a right ureteral stone here for cysto, R stent.  -The risks, benefits and  alternatives of cystoscopy with right JJ stent placement was discussed with the patient.  Risks include, but are not limited to: bleeding, urinary tract infection, ureteral injury, ureteral stricture disease, chronic pain, urinary symptoms, bladder injury, stent migration, the need for nephrostomy tube placement, MI, CVA, DVT, PE and the inherent risks with general anesthesia.  The patient voices understanding and wishes to proceed.      Matt R. Niquan Charnley MD 10/29/2022, 12:07 PM  [facility] Pager: [phone]

## 2022-10-29 NOTE — Anesthesia Preprocedure Evaluation (Addendum)
Anesthesia Evaluation  Patient identified by MRN, date of birth, ID band Patient awake    Reviewed: Allergy & Precautions, NPO status , Patient's Chart, lab work & pertinent test results  Airway Mallampati: III  TM Distance: >3 FB Neck ROM: Full    Dental  (+) Teeth Intact, Dental Advisory Given   Pulmonary asthma , sleep apnea , COPD,  COPD inhaler, former smoker   breath sounds clear to auscultation       Cardiovascular hypertension, Pt. on medications + dysrhythmias Atrial Fibrillation  Rhythm:Irregular Rate:Normal     Neuro/Psych  PSYCHIATRIC DISORDERS  Depression       GI/Hepatic hiatal hernia, PUD,GERD  ,,  Endo/Other  diabetes    Renal/GU      Musculoskeletal   Abdominal   Peds  Hematology   Anesthesia Other Findings   Reproductive/Obstetrics                             Anesthesia Physical Anesthesia Plan  ASA: 3  Anesthesia Plan: General   Post-op Pain Management: Tylenol PO (pre-op)*   Induction: Intravenous  PONV Risk Score and Plan: 3 and Ondansetron, Dexamethasone and Treatment may vary due to age or medical condition  Airway Management Planned: LMA  Additional Equipment: None  Intra-op Plan:   Post-operative Plan: Extubation in OR  Informed Consent: I have reviewed the patients History and Physical, chart, labs and discussed the procedure including the risks, benefits and alternatives for the proposed anesthesia with the patient or authorized representative who has indicated his/her understanding and acceptance.     Dental advisory given  Plan Discussed with: CRNA  Anesthesia Plan Comments:        Anesthesia Quick Evaluation

## 2022-10-29 NOTE — Anesthesia Procedure Notes (Signed)
Procedure Name: LMA Insertion Date/Time: 10/29/2022 1:26 PM  Performed by: Lavina Hamman, CRNAPre-anesthesia Checklist: Patient identified, Emergency Drugs available, Suction available, Patient being monitored and Timeout performed Patient Re-evaluated:Patient Re-evaluated prior to induction Oxygen Delivery Method: Circle system utilized Preoxygenation: Pre-oxygenation with 100% oxygen Induction Type: IV induction Ventilation: Mask ventilation without difficulty LMA: LMA inserted LMA Size: 4.0 Tube size: 4.0 mm Number of attempts: 1 Placement Confirmation: positive ETCO2 and breath sounds checked- equal and bilateral Tube secured with: Tape Dental Injury: Teeth and Oropharynx as per pre-operative assessment  Comments: ATOI

## 2022-10-30 ENCOUNTER — Ambulatory Visit: Payer: Medicare Other | Admitting: Family Medicine

## 2022-10-30 ENCOUNTER — Encounter (HOSPITAL_COMMUNITY): Payer: Self-pay | Admitting: Urology

## 2022-10-30 LAB — URINE CULTURE: Culture: NO GROWTH

## 2022-10-30 NOTE — Anesthesia Postprocedure Evaluation (Signed)
Anesthesia Post Note  Patient: Guy Moore  Procedure(s) Performed: CYSTOSCOPY WITH RETROGRADE WITH URETHERAL DILITATIONRIGHT DIAGNOSTIC  URETEROSCOPY AND STENT PLACEMENT (Right)     Patient location during evaluation: PACU Anesthesia Type: General Level of consciousness: awake and alert Pain management: pain level controlled Vital Signs Assessment: post-procedure vital signs reviewed and stable Respiratory status: spontaneous breathing, nonlabored ventilation, respiratory function stable and patient connected to nasal cannula oxygen Cardiovascular status: blood pressure returned to baseline and stable Postop Assessment: no apparent nausea or vomiting Anesthetic complications: no   No notable events documented.  Last Vitals:  Vitals:   10/29/22 1535 10/29/22 1536  BP: (!) 164/71   Pulse:  81  Resp:    Temp:    SpO2:  92%    Last Pain:  Vitals:   10/29/22 1515  TempSrc:   PainSc: 0-No pain                 Effie Berkshire

## 2022-10-30 NOTE — Telephone Encounter (Signed)
Patient with diagnosis of A Fib on Xarelto for anticoagulation.    Procedure:  cystoscopy and right retrograde pyelogram with a right stint laser lithotripsy  Date of procedure: 11/23/21   CHA2DS2-VASc Score = 4  This indicates a 4.8% annual risk of stroke. The patient's score is based upon: CHF History: 0 HTN History: 1 Diabetes History: 1 Stroke History: 0 Vascular Disease History: 0 Age Score: 2 Gender Score: 0    CrCl 69 mL/min Platelet count 108K   Per office protocol, patient can hold Xarelto for 2 days prior to procedure.    **This guidance is not considered finalized until pre-operative APP has relayed final recommendations.**

## 2022-10-30 NOTE — Telephone Encounter (Signed)
I left a message for the patient to call our office to schedule a tele visit for pre-op.   

## 2022-10-30 NOTE — Telephone Encounter (Signed)
   Name: Guy Moore  DOB: [birth year]  MRN: [id number]  Primary Cardiologist: None   Preoperative team, please contact this patient and set up a phone call appointment for further preoperative risk assessment. Please obtain consent and complete medication review. Thank you for your help.  I confirm that guidance regarding antiplatelet and oral anticoagulation therapy has been completed and, if necessary, noted below.  Pharmacy has provided recommendations for anticoagulation.   Deberah Pelton, NP 10/30/2022, 11:54 AM [place]

## 2022-11-02 ENCOUNTER — Ambulatory Visit (INDEPENDENT_AMBULATORY_CARE_PROVIDER_SITE_OTHER): Payer: Medicare Other | Admitting: Family Medicine

## 2022-11-02 ENCOUNTER — Encounter: Payer: Self-pay | Admitting: Family Medicine

## 2022-11-02 VITALS — BP 126/80 | HR 63 | Temp 97.6°F | Wt 241.0 lb

## 2022-11-02 DIAGNOSIS — K625 Hemorrhage of anus and rectum: Secondary | ICD-10-CM | POA: Diagnosis not present

## 2022-11-02 DIAGNOSIS — N489 Disorder of penis, unspecified: Secondary | ICD-10-CM

## 2022-11-02 DIAGNOSIS — E538 Deficiency of other specified B group vitamins: Secondary | ICD-10-CM | POA: Diagnosis not present

## 2022-11-02 MED ORDER — CYANOCOBALAMIN 1000 MCG/ML IJ SOLN
1000.0000 ug | Freq: Once | INTRAMUSCULAR | Status: AC
  Administered 2022-11-02: 1000 ug via INTRAMUSCULAR

## 2022-11-02 NOTE — Progress Notes (Signed)
   Subjective:    Patient ID: Guy Moore, male    DOB: December 13, 1933, 89 y.o.   MRN: [id number]  HPI Here for several issues. First he has had 3 days of passing bright red blood from the rectum. He has a hx of constipation and he used to take docusate sodium, but he stopped. He has also had diarrhea, and sometimes the blood is mixed with stool and sometimes not. No abdominal pain or fever or nausea. His last colonoscopy on 12-07-11 was clear except for diverticulosis on the left side. Of note he had a cystoscopy with placement of a ureteral stent on 10-29-22. Also he noticed a lesion on the penis about 2 weeks ago that he is worried about. This is not symptomatic. It is not changing in appearance.    Review of Systems  Constitutional: Negative.   Respiratory: Negative.    Cardiovascular: Negative.   Gastrointestinal:  Positive for anal bleeding and diarrhea. Negative for abdominal distention, abdominal pain, constipation, nausea, rectal pain and vomiting.       Objective:   Physical Exam Constitutional:      Appearance: Normal appearance. He is not ill-appearing.  Cardiovascular:     Rate and Rhythm: Normal rate and regular rhythm.     Pulses: Normal pulses.     Heart sounds: Normal heart sounds.  Pulmonary:     Effort: Pulmonary effort is normal.     Breath sounds: Normal breath sounds.  Abdominal:     General: Abdomen is flat. Bowel sounds are normal. There is no distension.     Palpations: Abdomen is soft. There is no mass.     Tenderness: There is no abdominal tenderness. There is no guarding or rebound.     Hernia: No hernia is present.  Genitourinary:    Rectum: Normal.     Comments: There are 2 small papular lesions on the distal penis, each is about 3 mm wide  Neurological:     Mental Status: He is alert.         Assessment & Plan:  Rectal bleeding, likely from diverticula. We will refer him to Dr. Loletha Carrow, his GI provider. I advised him to stay on docusate regularly  to soften the stools. As for the penile lesion, this is likey to be a wart. At his request, we will send him to see Dr. Pearline Cables, his dermatologist.  Alysia Penna, MD

## 2022-11-02 NOTE — Telephone Encounter (Signed)
Patient returned Pre-op call. 

## 2022-11-03 ENCOUNTER — Ambulatory Visit: Payer: Medicare Other

## 2022-11-03 ENCOUNTER — Telehealth: Payer: Self-pay | Admitting: Gastroenterology

## 2022-11-03 ENCOUNTER — Other Ambulatory Visit: Payer: Self-pay | Admitting: Urology

## 2022-11-03 ENCOUNTER — Telehealth: Payer: Self-pay | Admitting: *Deleted

## 2022-11-03 NOTE — Telephone Encounter (Signed)
Pt agreeable to pre op tele appt 11/12/22 @ 2:40. Med rec and consent are done. Pt said his procedure may be moved from 2/12 to 2/16, waiting to hear from Dr. Virgina Norfolk office [facility].       Patient Consent for Virtual Visit        Guy Moore has provided verbal consent on 11/03/2022 for a virtual visit (video or telephone).   CONSENT FOR VIRTUAL VISIT FOR:  Guy Moore  By participating in this virtual visit I agree to the following:  I hereby voluntarily request, consent and authorize [facility] and its employed or contracted physicians, physician assistants, nurse practitioners or other licensed health care professionals (the Practitioner), to provide me with telemedicine health care services (the "Services") as deemed necessary by the treating Practitioner. I acknowledge and consent to receive the Services by the Practitioner via telemedicine. I understand that the telemedicine visit will involve communicating with the Practitioner through live audiovisual communication technology and the disclosure of certain medical information by electronic transmission. I acknowledge that I have been given the opportunity to request an in-person assessment or other available alternative prior to the telemedicine visit and am voluntarily participating in the telemedicine visit.  I understand that I have the right to withhold or withdraw my consent to the use of telemedicine in the course of my care at any time, without affecting my right to future care or treatment, and that the Practitioner or I may terminate the telemedicine visit at any time. I understand that I have the right to inspect all information obtained and/or recorded in the course of the telemedicine visit and may receive copies of available information for a reasonable fee.  I understand that some of the potential risks of receiving the Services via telemedicine include:  Delay or interruption in medical evaluation due to  technological equipment failure or disruption; Information transmitted may not be sufficient (e.g. poor resolution of images) to allow for appropriate medical decision making by the Practitioner; and/or  In rare instances, security protocols could fail, causing a breach of personal health information.  Furthermore, I acknowledge that it is my responsibility to provide information about my medical history, conditions and care that is complete and accurate to the best of my ability. I acknowledge that Practitioner's advice, recommendations, and/or decision may be based on factors not within their control, such as incomplete or inaccurate data provided by me or distortions of diagnostic images or specimens that may result from electronic transmissions. I understand that the practice of medicine is not an exact science and that Practitioner makes no warranties or guarantees regarding treatment outcomes. I acknowledge that a copy of this consent can be made available to me via my patient portal ([facility]), or I can request a printed copy by calling the office of [facility].    I understand that my insurance will be billed for this visit.   I have read or had this consent read to me. I understand the contents of this consent, which adequately explains the benefits and risks of the Services being provided via telemedicine.  I have been provided ample opportunity to ask questions regarding this consent and the Services and have had my questions answered to my satisfaction. I give my informed consent for the services to be provided through the use of telemedicine in my medical care

## 2022-11-03 NOTE — Telephone Encounter (Signed)
Received a call from Dr. Thereasa Distance office with an urgent referral for patient for rectal bleeding. The first appointment available was 11/25/22.  They are asking for patient to be seen sooner.  Do you have anything sooner you can offer patient?  Please call patient and advise.  Thank you.

## 2022-11-03 NOTE — Telephone Encounter (Signed)
Called and left patient a detailed vm letting him know that Dr. Loletha Carrow can see him tomorrow for an office visit. I asked that patient call back ASAP so that we can get him scheduled.

## 2022-11-03 NOTE — Telephone Encounter (Signed)
Pt agreeable to pre op tele appt 11/12/22 @ 2:40. Med rec and consent are done. Pt said his procedure may be moved from 2/12 to 2/16, waiting to hear from Dr. Virgina Norfolk office [facility].

## 2022-11-03 NOTE — Telephone Encounter (Signed)
Pt returned call and was scheduled for a follow up appt with  Dr. Loletha Carrow tomorrow at 3:40 pm.

## 2022-11-04 ENCOUNTER — Emergency Department (HOSPITAL_COMMUNITY): Payer: Medicare Other

## 2022-11-04 ENCOUNTER — Encounter (HOSPITAL_COMMUNITY): Payer: Self-pay | Admitting: Radiology

## 2022-11-04 ENCOUNTER — Ambulatory Visit: Payer: Medicare Other | Admitting: Gastroenterology

## 2022-11-04 ENCOUNTER — Other Ambulatory Visit: Payer: Self-pay

## 2022-11-04 ENCOUNTER — Emergency Department (HOSPITAL_COMMUNITY)
Admission: EM | Admit: 2022-11-04 | Discharge: 2022-11-04 | Disposition: A | Discharge status: Home / Self Care | Payer: Medicare Other | Attending: Emergency Medicine | Admitting: Emergency Medicine

## 2022-11-04 DIAGNOSIS — N201 Calculus of ureter: Secondary | ICD-10-CM | POA: Diagnosis not present

## 2022-11-04 DIAGNOSIS — R339 Retention of urine, unspecified: Secondary | ICD-10-CM | POA: Diagnosis not present

## 2022-11-04 DIAGNOSIS — I10 Essential (primary) hypertension: Secondary | ICD-10-CM | POA: Diagnosis not present

## 2022-11-04 DIAGNOSIS — Z87440 Personal history of urinary (tract) infections: Secondary | ICD-10-CM

## 2022-11-04 DIAGNOSIS — K7689 Other specified diseases of liver: Secondary | ICD-10-CM | POA: Diagnosis not present

## 2022-11-04 DIAGNOSIS — N281 Cyst of kidney, acquired: Secondary | ICD-10-CM | POA: Diagnosis not present

## 2022-11-04 DIAGNOSIS — N200 Calculus of kidney: Secondary | ICD-10-CM

## 2022-11-04 DIAGNOSIS — N1330 Unspecified hydronephrosis: Secondary | ICD-10-CM | POA: Diagnosis not present

## 2022-11-04 DIAGNOSIS — K5730 Diverticulosis of large intestine without perforation or abscess without bleeding: Secondary | ICD-10-CM | POA: Diagnosis not present

## 2022-11-04 DIAGNOSIS — N132 Hydronephrosis with renal and ureteral calculous obstruction: Secondary | ICD-10-CM | POA: Diagnosis not present

## 2022-11-04 DIAGNOSIS — R1031 Right lower quadrant pain: Secondary | ICD-10-CM | POA: Diagnosis present

## 2022-11-04 LAB — COMPREHENSIVE METABOLIC PANEL
ALT: 26 U/L (ref 0–44)
AST: 55 U/L — ABNORMAL HIGH (ref 15–41)
Albumin: 3.9 g/dL (ref 3.5–5.0)
Alkaline Phosphatase: 45 U/L (ref 38–126)
Anion gap: 13 (ref 5–15)
BUN: 33 mg/dL — ABNORMAL HIGH (ref 8–23)
CO2: 22 mmol/L (ref 22–32)
Calcium: 8.5 mg/dL — ABNORMAL LOW (ref 8.9–10.3)
Chloride: 105 mmol/L (ref 98–111)
Creatinine, Ser: 1.06 mg/dL (ref 0.61–1.24)
GFR, Estimated: >60 mL/min (ref >60)
Glucose, Bld: 128 mg/dL — ABNORMAL HIGH (ref 70–99)
Potassium: 3.6 mmol/L (ref 3.5–5.1)
Sodium: 140 mmol/L (ref 135–145)
Total Bilirubin: 1.5 mg/dL — ABNORMAL HIGH (ref 0.3–1.2)
Total Protein: 6.7 g/dL (ref 6.5–8.1)

## 2022-11-04 LAB — URINALYSIS, MICROSCOPIC (REFLEX): RBC / HPF: >50 RBC/hpf (ref 0–5)

## 2022-11-04 LAB — URINALYSIS, ROUTINE W REFLEX MICROSCOPIC
Glucose, UA: NEGATIVE mg/dL
Ketones, ur: 15 mg/dL — AB
Nitrite: POSITIVE — AB
Protein, ur: 100 mg/dL — AB
Specific Gravity, Urine: 1.025 (ref 1.005–1.030)
pH: 6.5 (ref 5.0–8.0)

## 2022-11-04 LAB — CBC WITH DIFFERENTIAL/PLATELET
Abs Immature Granulocytes: 0.02 10*3/uL (ref 0.00–0.07)
Basophils Absolute: 0 10*3/uL (ref 0.0–0.1)
Basophils Relative: 0 %
Eosinophils Absolute: 0 10*3/uL (ref 0.0–0.5)
Eosinophils Relative: 1 %
HCT: 43.5 % (ref 39.0–52.0)
Hemoglobin: 14.4 g/dL (ref 13.0–17.0)
Immature Granulocytes: 0 %
Lymphocytes Relative: 7 %
Lymphs Abs: 0.4 10*3/uL — ABNORMAL LOW (ref 0.7–4.0)
MCH: 29.3 pg (ref 26.0–34.0)
MCHC: 33.1 g/dL (ref 30.0–36.0)
MCV: 88.6 fL (ref 80.0–100.0)
Monocytes Absolute: 0.5 10*3/uL (ref 0.1–1.0)
Monocytes Relative: 8 %
Neutro Abs: 5.3 10*3/uL (ref 1.7–7.7)
Neutrophils Relative %: 84 %
Platelets: 172 10*3/uL (ref 150–400)
RBC: 4.91 MIL/uL (ref 4.22–5.81)
RDW: 14.7 % (ref 11.5–15.5)
WBC: 6.3 10*3/uL (ref 4.0–10.5)
nRBC: 0 % (ref 0.0–0.2)

## 2022-11-04 LAB — LACTIC ACID, PLASMA: Lactic Acid, Venous: 1.3 mmol/L (ref 0.5–1.9)

## 2022-11-04 LAB — LIPASE, BLOOD: Lipase: 34 U/L (ref 11–51)

## 2022-11-04 MED ORDER — SODIUM CHLORIDE 0.9 % IV BOLUS
1000.0000 mL | Freq: Once | INTRAVENOUS | Status: AC
  Administered 2022-11-04: 1000 mL via INTRAVENOUS

## 2022-11-04 MED ORDER — SODIUM CHLORIDE 0.9 % IV SOLN
1.0000 g | Freq: Once | INTRAVENOUS | Status: AC
  Administered 2022-11-04: 1 g via INTRAVENOUS
  Filled 2022-11-04: qty 10

## 2022-11-04 MED ORDER — FENTANYL CITRATE PF 50 MCG/ML IJ SOSY
50.0000 ug | PREFILLED_SYRINGE | Freq: Once | INTRAMUSCULAR | Status: AC
  Administered 2022-11-04: 50 ug via INTRAVENOUS
  Filled 2022-11-04: qty 1

## 2022-11-04 MED ORDER — CEFDINIR 300 MG PO CAPS: 300.0000 mg | ORAL_CAPSULE | Freq: Two times a day (BID) | ORAL | 0 refills | Status: DC

## 2022-11-04 MED ORDER — CIPROFLOXACIN HCL 500 MG PO TABS: 500.0000 mg | ORAL_TABLET | Freq: Two times a day (BID) | ORAL | 0 refills | Status: DC

## 2022-11-04 MED ORDER — AMLODIPINE BESYLATE 5 MG PO TABS
5.0000 mg | ORAL_TABLET | Freq: Once | ORAL | Status: AC
  Administered 2022-11-04: 5 mg via ORAL
  Filled 2022-11-04: qty 1

## 2022-11-04 MED ORDER — ONDANSETRON HCL 4 MG/2ML IJ SOLN
4.0000 mg | Freq: Once | INTRAMUSCULAR | Status: AC
  Administered 2022-11-04: 4 mg via INTRAVENOUS
  Filled 2022-11-04: qty 2

## 2022-11-04 MED ORDER — KETOROLAC TROMETHAMINE 15 MG/ML IJ SOLN
15.0000 mg | Freq: Once | INTRAMUSCULAR | Status: AC
  Administered 2022-11-04: 15 mg via INTRAMUSCULAR
  Filled 2022-11-04: qty 1

## 2022-11-04 NOTE — ED Provider Notes (Signed)
[facility] EMERGENCY DEPARTMENT AT [facility] Provider Note   CSN: [id number] Arrival date & time: 11/04/22  7902     History  Chief Complaint  Patient presents with   Abdominal Pain    Guy Moore is a 89 y.o. male.  Patient with right-sided lower abdominal pain that onset this morning.  It is sharp and severe.  Patient recently admitted for stent placement for nephrolithiasis.  He has a right ureteral stent.  He did have a Foley catheter in place which was removed at home yesterday.  He has been able to urinate today but noticed the urine has been dark brown and red-colored.  He is urinating small amounts frequently and urgently having some pain with urination.  He is having sharp lower abdominal pain similar to previous pain from his kidney stone.  He has had not had significant pain since he had his surgery on January 16.  He has not had a fever that he knows of.  He has severe nausea this morning but no vomiting.  Nausea and vomiting have improved he just has pain.  Denies testicular pain.  Denies chest pain or shortness of breath.  Does take Xarelto.  He is also been on Cipro for the past 2 weeks or so due to questionable UTI.  The history is provided by the spouse and the patient.  Abdominal Pain Associated symptoms: dysuria, hematuria and nausea   Associated symptoms: no chest pain, no cough, no fever, no shortness of breath and no vomiting        Home Medications Prior to Admission medications   Medication Sig Start Date End Date Taking? Authorizing Provider  acetaminophen (TYLENOL) 325 MG tablet Take 2 tablets (650 mg total) by mouth every 6 (six) hours as needed for mild pain (or Fever >/= 101). 05/28/21   Elgergawy, Silver Huguenin, MD  albuterol (VENTOLIN HFA) 108 (90 Base) MCG/ACT inhaler USE 2 INHALATIONS EVERY 6 HOURS AS NEEDED FOR WHEEZING OR SHORTNESS OF BREATH Patient taking differently: Inhale 2 puffs into the lungs every 6 (six) hours as needed for  shortness of breath or wheezing. 02/03/21   [name], MD  amLODipine (NORVASC) 5 MG tablet TAKE 1 TABLET DAILY 07/29/22   Laurey Morale, MD  BESIVANCE 0.6 % SUSP Place 1 drop into both eyes every 30 (thirty) days. 07/24/20   [provider]  docusate sodium (COLACE) 100 MG capsule Take 1 capsule (100 mg total) by mouth daily as needed for up to 30 doses. 10/29/22   Janith Lima, MD  FLOVENT DISKUS 50 MCG/ACT AEPB USE 1 INHALATION TWICE A DAY Patient taking differently: Take 1 Inhalation by mouth 2 (two) times daily. 01/15/22   [name], MD  hypromellose (GENTEAL) 0.3 % GEL ophthalmic ointment Place 1 Application into both eyes as needed for dry eyes. States it is liquid 06/26/21   [provider]  MAGNESIUM GLUCONATE PO Take 1 tablet by mouth daily.    [provider]  Methylcellulose, Laxative, (CITRUCEL PO) Take 2 tablets by mouth daily after breakfast. Patient not taking: Reported on 11/02/2022    [provider]  morphine (MSIR) 15 MG tablet Take 0.5 tablets (7.5 mg total) by mouth every 4 (four) hours as needed for severe pain. Patient not taking: Reported on 11/02/2022 10/27/22   Deno Etienne, DO  Multiple Vitamins-Minerals (PRESERVISION AREDS 2) CAPS Take 1 capsule by mouth 2 (two) times daily.    [provider]  omeprazole ([place])  40 MG capsule Take 1 capsule (40 mg total) by mouth daily. Patient taking differently: Take 40 mg by mouth daily as needed (Acid reflux). 04/30/22   Laurey Morale, MD  ondansetron (ZOFRAN-ODT) 4 MG disintegrating tablet Take 1 tablet (4 mg total) by mouth every 4 (four) hours as needed for nausea/vomiting. 10/27/22   Deno Etienne, DO  oxyCODONE-acetaminophen (PERCOCET) 5-325 MG tablet Take 1 tablet by mouth every 4 (four) hours as needed for up to 18 doses for severe pain. 10/29/22   Janith Lima, MD  potassium chloride SA (KLOR-CON M) 20 MEQ tablet TAKE 1 TABLET DAILY Patient taking differently: Take 20 mEq by mouth  daily. 09/10/22   Laurey Morale, MD  pravastatin (PRAVACHOL) 40 MG tablet TAKE 1 TABLET AT BEDTIME Patient taking differently: Take 40 mg by mouth daily. 09/10/22   Laurey Morale, MD  rivaroxaban (XARELTO) 20 MG TABS tablet Take 1 tablet (20 mg total) by mouth daily. 05/08/22   Laurey Morale, MD  tamsulosin (FLOMAX) 0.4 MG CAPS capsule Take 1 capsule (0.4 mg total) by mouth daily after supper. 10/27/22   Deno Etienne, DO  valsartan (DIOVAN) 160 MG tablet TAKE 1 TABLET DAILY Patient taking differently: Take 160 mg by mouth daily. 07/29/22   Laurey Morale, MD      Allergies    Patient has no known allergies.    Review of Systems   Review of Systems  Constitutional:  Negative for activity change, appetite change and fever.  HENT:  Negative for congestion and rhinorrhea.   Respiratory:  Negative for cough, chest tightness and shortness of breath.   Cardiovascular:  Negative for chest pain.  Gastrointestinal:  Positive for abdominal pain and nausea. Negative for vomiting.  Genitourinary:  Positive for dysuria, flank pain, hematuria and urgency.  Musculoskeletal:  Negative for arthralgias, back pain and myalgias.  Skin:  Negative for rash.  Neurological:  Negative for dizziness, weakness and headaches.   all other systems are negative except as noted in the HPI and PMH.    Physical Exam Updated Vital Signs BP (!) 189/112   Pulse 93   Temp 98 F (36.7 C) (Oral)   Resp 19   SpO2 100%  Physical Exam Vitals and nursing note reviewed.  Constitutional:      General: He is not in acute distress.    Appearance: He is well-developed.  HENT:     Head: Normocephalic and atraumatic.     Mouth/Throat:     Pharynx: No oropharyngeal exudate.  Eyes:     Conjunctiva/sclera: Conjunctivae normal.     Pupils: Pupils are equal, round, and reactive to light.  Neck:     Comments: No meningismus. Cardiovascular:     Rate and Rhythm: Normal rate and regular rhythm.     Heart sounds: Normal heart  sounds. No murmur heard. Pulmonary:     Effort: Pulmonary effort is normal. No respiratory distress.     Breath sounds: Normal breath sounds.  Abdominal:     Palpations: Abdomen is soft.     Tenderness: There is abdominal tenderness. There is guarding. There is no rebound.     Comments: Right-sided lower abdominal pain with voluntary guarding.  No left-sided abdominal pain.  Genitourinary:    Comments: Brown-colored urine in diaper.  No testicular pain Musculoskeletal:        General: No tenderness. Normal range of motion.     Cervical back: Normal range of motion and neck supple.  Skin:  General: Skin is warm.  Neurological:     Mental Status: He is alert and oriented to person, place, and time.     Cranial Nerves: No cranial nerve deficit.     Motor: No abnormal muscle tone.     Coordination: Coordination normal.     Comments:  5/5 strength throughout. CN 2-12 intact.Equal grip strength.   Psychiatric:        Behavior: Behavior normal.     ED Results / Procedures / Treatments   Labs (all labs ordered are listed, but only abnormal results are displayed) Labs Reviewed  CBC WITH DIFFERENTIAL/PLATELET - Abnormal; Notable for the following components:      Result Value   Lymphs Abs 0.4 (*)    All other components within normal limits  COMPREHENSIVE METABOLIC PANEL - Abnormal; Notable for the following components:   Glucose, Bld 128 (*)    BUN 33 (*)    Calcium 8.5 (*)    AST 55 (*)    Total Bilirubin 1.5 (*)    All other components within normal limits  URINALYSIS, ROUTINE W REFLEX MICROSCOPIC - Abnormal; Notable for the following components:   Color, Urine BROWN (*)    APPearance TURBID (*)    Hgb urine dipstick LARGE (*)    Bilirubin Urine LARGE (*)    Ketones, ur 15 (*)    Protein, ur 100 (*)    Nitrite POSITIVE (*)    Leukocytes,Ua MODERATE (*)    All other components within normal limits  URINALYSIS, MICROSCOPIC (REFLEX) - Abnormal; Notable for the following  components:   Bacteria, UA FEW (*)    All other components within normal limits  URINE CULTURE  CULTURE, BLOOD (ROUTINE X 2)  CULTURE, BLOOD (ROUTINE X 2)  LIPASE, BLOOD  LACTIC ACID, PLASMA  LACTIC ACID, PLASMA    EKG None  Radiology CT RENAL STONE STUDY  Result Date: 11/04/2022 CLINICAL DATA:  Lower abdominal pain, normal urine output, recent ureteral stent placement, Foley catheter removed yesterday, black flanks in urine EXAM: CT ABDOMEN AND PELVIS WITHOUT CONTRAST TECHNIQUE: Multidetector CT imaging of the abdomen and pelvis was performed following the standard protocol without IV contrast. RADIATION DOSE REDUCTION: This exam was performed according to the departmental dose-optimization program which includes automated exposure control, adjustment of the mA and/or kV according to patient size and/or use of iterative reconstruction technique. COMPARISON:  10/27/2022 FINDINGS: Lower chest: Emphysematous changes with calcified granulomata at lung bases. Scarring at base of RIGHT middle lobe. 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 Hepatobiliary: Small cysts within LEFT lobe liver unchanged. Gallbladder and liver otherwise normal appearance Pancreas: Normal appearance Spleen: Normal appearance Adrenals/Urinary Tract: Adrenal glands thickened without focal mass. RIGHT ureteral stent extends from renal pelvis to urinary bladder. Small amount of air within mildly RIGHT renal pelvis. BILATERAL hydronephrosis greater on RIGHT. RIGHT ureteral dilatation urinary bladder with a small distal RIGHT ureteral calculus visualized image 65. Nondilated LEFT ureter. Exophytic cyst anterior upper LEFT kidney 2.6 cm diameter; no follow-up imaging recommended. Additional cyst anterior upper to mid RIGHT kidney 2.1 cm diameter, no follow-up imaging recommended. Bladder otherwise unremarkable. Stomach/Bowel: Increased stool in rectum. Diverticulosis of sigmoid colon without diverticulitis changes. Normal  appendix. Stomach and bowel loops otherwise normal appearance. Vascular/Lymphatic: Atherosclerotic calcifications aorta and iliac arteries without aneurysm. Minimal coronary arterial calcifications and pericardial effusion. No adenopathy. Reproductive: Minimal enlargement of prostate gland. Seminal vesicles unremarkable Other: No free air or free fluid. No hernia or inflammatory process. Osseous demineralization  with scattered degenerative disc disease changes. Musculoskeletal: Multifactorial spinal stenosis L4-L5, less L5-S1 IMPRESSION: BILATERAL hydronephrosis greater on RIGHT with RIGHT ureteral dilatation urinary bladder with a small distal RIGHT ureteral calculus visualized adjacent to RIGHT ureteral stent. Sigmoid diverticulosis without evidence of diverticulitis. Increased stool in rectum. Multifactorial spinal stenosis L4-L5, less L5-S1. Minimal coronary arterial calcifications and pericardial effusion. Aortic Atherosclerosis (ICD10-I70.0). Electronically Signed   By: Lavonia Dana M.D.   On: 11/04/2022 09:52    Procedures Procedures    Medications Ordered in ED Medications  fentaNYL (SUBLIMAZE) injection 50 mcg (has no administration in time range)  ondansetron (ZOFRAN) injection 4 mg (has no administration in time range)  sodium chloride 0.9 % bolus 1,000 mL (has no administration in time range)  ketorolac (TORADOL) 15 MG/ML injection 15 mg (15 mg Intramuscular Given 11/04/22 0854)    ED Course/ Medical Decision Making/ A&P                             Medical Decision Making Amount and/or Complexity of Data Reviewed Labs: ordered. Decision-making details documented in ED Course. Radiology: ordered and independent interpretation performed. Decision-making details documented in ED Course. ECG/medicine tests: ordered and independent interpretation performed. Decision-making details documented in ED Course.  Risk Prescription drug management.   Patient with recent kidney stone with  ureteral stent in place here with severe right-sided lower abdominal pain and nausea since this morning.  Hypertensive.  No fever.  Did not take his blood pressure medication this morning.  Patient given IV fluids, pain and nausea medications.  Urinalysis obtained in triage is dark and discolored with concern for infection positive nitrite and positive leukocyte esterase.  Patient has been on Cipro for about 14 days already.  Creatinine is at baseline.  Bladder scan shows 495 cc of urine will place Foley catheter. Urine culture from January 11 was positive for Enterobacter.  Subsequent culture in January 18 was negative  Foley catheter was placed with over 1 L of urine drained and patient is feeling much better.  He is given his home blood pressure medications which he missed this morning.  Discussed with Dr. Felipa Eth of urology at bedside.  Dr. Felipa Eth feels stent is working appropriately and is not blocked.  His creatinine is normal.  His urinalysis is suspect due to the stents, Foley catheter and recent treatment with Cipro.  Patient does not appear to be toxic or septic.  No fever or leukocytosis.  Repeat blood and urine cultures are sent.  Dr. Felipa Eth feels patient is appropriate for discharge home.  Family is comfortable with this plan.  He recommends extending course of Cipro and treating patient's pain with his home Percocet and Flomax.  Dr. Abner Greenspan will see the patient in follow-up this week.  Given patient's borderline QT we will change him from Cipro to [facility].  Recent culture was sensitive to ceftriaxone.  Admission offered but patient prefers to go home.  He will be given a dose of IV Rocephin prior to discharge.  Return precautions discussed including severe intractable pain, fever, not able to urinate, vomiting or any other concerns.       Final Clinical Impression(s) / ED Diagnoses Final diagnoses:  Urinary retention  Nephrolithiasis    Rx / DC Orders ED Discharge  Orders     None         Krue Peterka, Annie Main, MD 11/04/22 1536

## 2022-11-04 NOTE — ED Provider Triage Note (Signed)
Emergency Medicine Provider Triage Evaluation Note  Guy Moore , a 89 y.o. male  was evaluated in triage.  Pt complains of right lower quadrant abdominal pain, thought to be due to a kidney stone.  Patient recently admitted due to nephrolithiasis with multiple large stones which required some surgical intervention.  Stent was placed.  Patient states he had a catheter which was removed yesterday.  He has been able to urinate.  He states that at approximately 3 this morning he woke up with severe pain which is similar to the previous pain from stones.  He states that urology recommended he come to [facility] for further evaluation and management.  He denies nausea and vomiting at this time  Review of Systems  Positive: As above Negative: As above  Physical Exam  BP (!) 171/103 (BP Location: Left Arm)   Pulse (!) 105   Temp 97.8 F (36.6 C) (Oral)   Resp 20   SpO2 100%  Gen:   Awake, patient appears uncomfortable Resp:  Normal effort  MSK:   Moves extremities without difficulty  Other:  Patient with right lower quadrant abdominal pain and right CVA tenderness  Medical Decision Making  Medically screening exam initiated at 8:34 AM.  Appropriate orders placed.  Guy Moore was informed that the remainder of the evaluation will be completed by another provider, this initial triage assessment does not replace that evaluation, and the importance of remaining in the ED until their evaluation is complete.     Dorothyann Peng, [place] 11/04/22 [phone]

## 2022-11-04 NOTE — Consult Note (Signed)
Urology Consult   Physician requesting consult: Ezequiel Essex, MD  Reason for consult: Urinary retention, right hydronephrosis, UTI s/p stent insertion  History of Present Illness: Guy Moore is a 89 y.o. male seen in consultation from Dr. Wyvonnia Dusky for evaluation of urinary retention, right hydronephrosis, and possible UTI.  He is status post cystoscopy, urethral dilation, right retrograde pyelogram, right ureteroscopy, and right ureteral stent placement by Dr. Abner Greenspan on 10/29/2022.  He was discharged with the right ureteral stent and a Foley catheter in place.  He remove the catheter at home 2 days ago.  He reports that he has been voiding since the catheter was removed.  He presented to the emergency room today with lower abdominal pain with increasing severity.  He was not having any flank pain.  No fevers or chills.  His urine has been bloody with some small clots.  He continues on Cipro for treatment of a UTI.  Urine culture from 10/22/2022 grew >100 K Enterobacter. Urinalysis was nitrite positive with >50 RBCs, 0-5 WBCs, few bacteria.  Creatinine normal at 1.6.  WBC 6.3. CT abdomen and pelvis without contrast demonstrated a right ureteral stent extending from the renal pelvis to the urinary bladder with a small amount of air within the mildly dilated right renal pelvis, bilateral hydronephrosis greater on the right, small distal right ureteral calculus, and distended bladder. A Foley catheter was placed with return of 600 mL of rust colored urine.  The patient had immediate relief of his lower abdominal pain following catheter placement.  Past Medical History:  Diagnosis Date   Allergic rhinitis    Arthritis    Asthma    Atrial fibrillation [facility])    sees Dr. [name] at [facility]    Atrial flutter ([place])    BCC (basal cell carcinoma of skin)    Nose   Complication of anesthesia    COPD with emphysema ([place])    sees Dr. [name]    Depression    Diabetes mellitus  ([place])    Diet control , hyperglycemic but not diabetic per Pt 10/2022   Diverticulosis    Erosive esophagitis    Esophageal stricture    Gastric ulcer    Hearing loss    uses amplification   Hemorrhoids    Hiatal hernia    HTN (hypertension)    Hyperlipidemia    Knee problem    2% permanent partial impairment Right   Macular degeneration    Perineal abscess    Pneumonia    Sleep apnea     Past Surgical History:  Procedure Laterality Date   CARDIAC ELECTROPHYSIOLOGY STUDY AND ABLATION     x 4 (sees Dr. [name] at [facility])   [place] Right 10/29/2022   Procedure: CYSTOSCOPY WITH RETROGRADE WITH URETHERAL [place] DIAGNOSTIC  URETEROSCOPY AND STENT PLACEMENT;  Surgeon: Janith Lima, MD;  Location: [facility];  Service: Urology;  Laterality: Right;   NOSE SURGERY     UVULOPALATOPHARYNGOPLASTY  06/13/1979   VASECTOMY  83/66/2947   w/complications    Medications:  Scheduled Meds:  amLODipine  5 mg Oral Once   Continuous Infusions:  cefTRIAXone (ROCEPHIN)  IV 1 g (11/04/22 1513)   PRN Meds:.  Allergies: No Known Allergies  Family History  Problem Relation Age of Onset   Stroke Mother    Breast cancer Mother    Heart attack Mother    Other  Mother        brain tunor   Heart disease Father    Hypertension Father    Bladder Cancer Father    Parkinson's disease Father    Stroke Father    Breast cancer Brother    Atrial fibrillation Brother    Tremor Brother    Stroke Brother    Hypertension Brother    Breast cancer Maternal Grandmother    Heart disease Maternal Grandmother    Heart disease Maternal Grandfather    Arthritis Daughter    Heart disease Daughter    Colitis Daughter        Lymphocytic   Colon cancer Neg Hx     Social History:  reports that he quit smoking about 45 years ago. His smoking use included cigarettes. He has a 28.00 pack-year smoking history. He has  never used smokeless tobacco. He reports that he does not currently use alcohol. He reports that he does not use drugs.  ROS: A complete review of systems was performed.  All systems are negative except for pertinent findings as noted.  Physical Exam:  Vital signs in last 24 hours: Temp:  [97.8 F (36.6 C)-98 F (36.7 C)] 98 F (36.7 C) (01/24 1147) Pulse Rate:  [75-105] 81 (01/24 1500) Resp:  [13-25] 20 (01/24 1500) BP: (139-189)/(85-112) 152/98 (01/24 1500) SpO2:  [97 %-100 %] 100 % (01/24 1500) GENERAL APPEARANCE:  Well appearing, well developed, well nourished, NAD HEENT:  Atraumatic, normocephalic, oropharynx clear NECK:  Supple without lymphadenopathy or thyromegaly ABDOMEN:  Soft, non-tender, no masses EXTREMITIES:  Moves all extremities well, without clubbing, cyanosis, or edema NEUROLOGIC:  Alert and oriented x 3, CN II-XII grossly intact MENTAL STATUS:  appropriate BACK:  Non-tender to palpation, No CVAT SKIN:  Warm, dry, and intact GU:  foley draining rust colored urine  Laboratory Data:  Recent Labs    11/04/22 0853  WBC 6.3  HGB 14.4  HCT 43.5  PLT 172    Recent Labs    11/04/22 0853  NA 140  K 3.6  CL 105  GLUCOSE 128*  BUN 33*  CALCIUM 8.5*  CREATININE 1.06     Results for orders placed or performed during the hospital encounter of 11/04/22 (from the past 24 hour(s))  CBC with Differential     Status: Abnormal   Collection Time: 11/04/22  8:53 AM  Result Value Ref Range   WBC 6.3 4.0 - 10.5 K/uL   RBC 4.91 4.22 - 5.81 MIL/uL   Hemoglobin 14.4 13.0 - 17.0 g/dL   HCT 43.5 39.0 - 52.0 %   MCV 88.6 80.0 - 100.0 fL   MCH 29.3 26.0 - 34.0 pg   MCHC 33.1 30.0 - 36.0 g/dL   RDW 14.7 11.5 - 15.5 %   Platelets 172 150 - 400 K/uL   nRBC 0.0 0.0 - 0.2 %   Neutrophils Relative % 84 %   Neutro Abs 5.3 1.7 - 7.7 K/uL   Lymphocytes Relative 7 %   Lymphs Abs 0.4 (L) 0.7 - 4.0 K/uL   Monocytes Relative 8 %   Monocytes Absolute 0.5 0.1 - 1.0 K/uL    Eosinophils Relative 1 %   Eosinophils Absolute 0.0 0.0 - 0.5 K/uL   Basophils Relative 0 %   Basophils Absolute 0.0 0.0 - 0.1 K/uL   Immature Granulocytes 0 %   Abs Immature Granulocytes 0.02 0.00 - 0.07 K/uL  Comprehensive metabolic panel     Status: Abnormal   Collection Time:  11/04/22  8:53 AM  Result Value Ref Range   Sodium 140 135 - 145 mmol/L   Potassium 3.6 3.5 - 5.1 mmol/L   Chloride 105 98 - 111 mmol/L   CO2 22 22 - 32 mmol/L   Glucose, Bld 128 (H) 70 - 99 mg/dL   BUN 33 (H) 8 - 23 mg/dL   Creatinine, Ser 1.06 0.61 - 1.24 mg/dL   Calcium 8.5 (L) 8.9 - 10.3 mg/dL   Total Protein 6.7 6.5 - 8.1 g/dL   Albumin 3.9 3.5 - 5.0 g/dL   AST 55 (H) 15 - 41 U/L   ALT 26 0 - 44 U/L   Alkaline Phosphatase 45 38 - 126 U/L   Total Bilirubin 1.5 (H) 0.3 - 1.2 mg/dL   GFR, Estimated >60 >60 mL/min   Anion gap 13 5 - 15  Lipase, blood     Status: None   Collection Time: 11/04/22  8:53 AM  Result Value Ref Range   Lipase 34 11 - 51 U/L  Urinalysis, Routine w reflex microscopic -     Status: Abnormal   Collection Time: 11/04/22  8:53 AM  Result Value Ref Range   Color, Urine BROWN (A) YELLOW   APPearance TURBID (A) CLEAR   Specific Gravity, Urine 1.025 1.005 - 1.030   pH 6.5 5.0 - 8.0   Glucose, UA NEGATIVE NEGATIVE mg/dL   Hgb urine dipstick LARGE (A) NEGATIVE   Bilirubin Urine LARGE (A) NEGATIVE   Ketones, ur 15 (A) NEGATIVE mg/dL   Protein, ur 100 (A) NEGATIVE mg/dL   Nitrite POSITIVE (A) NEGATIVE   Leukocytes,Ua MODERATE (A) NEGATIVE  Urinalysis, Microscopic (reflex)     Status: Abnormal   Collection Time: 11/04/22  8:53 AM  Result Value Ref Range   RBC / HPF >50 0 - 5 RBC/hpf   WBC, UA 0-5 0 - 5 WBC/hpf   Bacteria, UA FEW (A) NONE SEEN   Squamous Epithelial / HPF 0-5 0 - 5 /HPF   Amorphous Crystal PRESENT   Lactic acid, plasma     Status: None   Collection Time: 11/04/22  1:52 PM  Result Value Ref Range   Lactic Acid, Venous 1.3 0.5 - 1.9 mmol/L   Recent Results  (from the past 240 hour(s))  Urine Culture     Status: None   Collection Time: 10/29/22  1:48 PM   Specimen: Urine, Cystoscope  Result Value Ref Range Status   Specimen Description   Final    PELVIS Performed at [facility], [place] [address] [place], [place] [address]    Special Requests   Final    NONE Performed at [facility], [place] [address], [place], [place] [address]    Culture   Final    NO GROWTH Performed at [facility], [place] [address] [place], [place] [address]    Report Status 10/30/2022 FINAL  Final    Renal Function: Recent Labs    11/04/22 0853  CREATININE 1.06   Estimated Creatinine Clearance: 61.9 mL/min (by C-G formula based on SCr of 1.06 mg/dL).  Radiologic Imaging: CT RENAL STONE STUDY  Result Date: 11/04/2022 CLINICAL DATA:  Lower abdominal pain, normal urine output, recent ureteral stent placement, Foley catheter removed yesterday, black flanks in urine EXAM: CT ABDOMEN AND PELVIS WITHOUT CONTRAST TECHNIQUE: Multidetector CT imaging of the abdomen and pelvis was performed following the standard protocol without IV contrast. RADIATION DOSE REDUCTION: This exam was performed according to the  departmental dose-optimization program which includes automated exposure control, adjustment of the mA and/or kV according to patient size and/or use of iterative reconstruction technique. COMPARISON:  10/27/2022 FINDINGS: Lower chest: Emphysematous changes with calcified granulomata at lung bases. Scarring at base of RIGHT middle lobe. 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 Hepatobiliary: Small cysts within LEFT lobe liver unchanged. Gallbladder and liver otherwise normal appearance Pancreas: Normal appearance Spleen: Normal appearance Adrenals/Urinary Tract: Adrenal glands thickened without focal mass. RIGHT ureteral stent extends from renal pelvis to urinary bladder. Small amount of air within mildly RIGHT renal pelvis.  BILATERAL hydronephrosis greater on RIGHT. RIGHT ureteral dilatation urinary bladder with a small distal RIGHT ureteral calculus visualized image 65. Nondilated LEFT ureter. Exophytic cyst anterior upper LEFT kidney 2.6 cm diameter; no follow-up imaging recommended. Additional cyst anterior upper to mid RIGHT kidney 2.1 cm diameter, no follow-up imaging recommended. Bladder otherwise unremarkable. Stomach/Bowel: Increased stool in rectum. Diverticulosis of sigmoid colon without diverticulitis changes. Normal appendix. Stomach and bowel loops otherwise normal appearance. Vascular/Lymphatic: Atherosclerotic calcifications aorta and iliac arteries without aneurysm. Minimal coronary arterial calcifications and pericardial effusion. No adenopathy. Reproductive: Minimal enlargement of prostate gland. Seminal vesicles unremarkable Other: No free air or free fluid. No hernia or inflammatory process. Osseous demineralization with scattered degenerative disc disease changes. Musculoskeletal: Multifactorial spinal stenosis L4-L5, less L5-S1 IMPRESSION: BILATERAL hydronephrosis greater on RIGHT with RIGHT ureteral dilatation urinary bladder with a small distal RIGHT ureteral calculus visualized adjacent to RIGHT ureteral stent. Sigmoid diverticulosis without evidence of diverticulitis. Increased stool in rectum. Multifactorial spinal stenosis L4-L5, less L5-S1. Minimal coronary arterial calcifications and pericardial effusion. Aortic Atherosclerosis (ICD10-I70.0). Electronically Signed   By: Lavonia Dana M.D.   On: 11/04/2022 09:52    I independently reviewed the above imaging studies.  Impression/Recommendation Urinary retention Bilateral hydronephrosis - likely secondary to bladder distention History of  UTI - currently on Cipro Ureteral calculi - s/p right ureteral stent insertion 10/29/22  I think his symptoms were primarily related to his urinary retention.  He has had relief following catheter placement. His  urinalysis is consistent with a stent.  A urine culture is pending to rule out evaluation for persistent UTI. Agree with Rocephin IV in the emergency department. His hydronephrosis is likely secondary to the bladder distention and should resolve with bladder decompression. Recommend discharge home with the Foley catheter in place. Continue Cipro. Recommend resuming tamsulosin 0.4 mg daily. Follow-up with Dr. Abner Greenspan in 1 week for possible voiding trial.  I spoke with Dr. Abner Greenspan regarding the above plan.  He agrees.  The patient and his family are in agreement as well.   Michaelle Birks 11/04/2022, 3:19 PM

## 2022-11-04 NOTE — Discharge Instructions (Signed)
Keep catheter in place and follow-up with Dr. Abner Greenspan this week.  Take the antibiotics as prescribed as well as your pain medication and Flomax.  Return to the ED with severe pain, fever, vomiting, not able to eat or drink, unable to urinate or any other concerns.

## 2022-11-04 NOTE — ED Notes (Signed)
Leg bag placed and patient tolerating water at this time

## 2022-11-04 NOTE — ED Notes (Signed)
Bladder scan 452m

## 2022-11-04 NOTE — ED Triage Notes (Signed)
Pt states he recently had urethral stent placed and foley and had foley d/c'd yesterday. States since then he's been c/o lower abdominal pain. Normal urine output, but pt states that he has had "black flakes" in his urine.

## 2022-11-04 NOTE — Progress Notes (Deleted)
[facility] Consult Note:  History: Guy Moore 11/04/2022  Referring provider: Laurey Morale, MD  Reason for consult/chief complaint: No chief complaint on file.   Subjective  HPI: Dr. Sarajane Jews saw this patient yesterday for a few days of painless rectal bleeding and asked that he be evaluated.  I last saw him in 2022 for diarrhea that occurred in the setting of a long course of antibiotics for a perineal infection.  C. difficile was negative at that time. Guy Moore is also lately had ureteral stones with stent placement last week.  Cystoscopy and possible lithotripsy planned for next month. Last colonoscopy by Dr. Sharlett Iles in 2013 notable for diverticulosis.  ***   ROS:  Review of Systems   Past Medical History: Past Medical History:  Diagnosis Date   Allergic rhinitis    Arthritis    Asthma    Atrial fibrillation ([place])    sees Dr. [name] at [facility]    Atrial flutter ([place])    BCC (basal cell carcinoma of skin)    Nose   Complication of anesthesia    COPD with emphysema ([place])    sees Dr. [name]    Depression    Diabetes mellitus ([place])    Diet control , hyperglycemic but not diabetic per Pt 10/2022   Diverticulosis    Erosive esophagitis    Esophageal stricture    Gastric ulcer    Hearing loss    uses amplification   Hemorrhoids    Hiatal hernia    HTN (hypertension)    Hyperlipidemia    Knee problem    2% permanent partial impairment Right   Macular degeneration    Perineal abscess    Pneumonia    Sleep apnea      Past Surgical History: Past Surgical History:  Procedure Laterality Date   CARDIAC ELECTROPHYSIOLOGY STUDY AND ABLATION     x 4 (sees Dr. [name] at [facility])   [place] Right 10/29/2022   Procedure: CYSTOSCOPY WITH RETROGRADE WITH URETHERAL [place] DIAGNOSTIC  URETEROSCOPY AND STENT PLACEMENT;  Surgeon:  Janith Lima, MD;  Location: [facility];  Service: Urology;  Laterality: Right;   NOSE SURGERY     UVULOPALATOPHARYNGOPLASTY  06/13/1979   VASECTOMY  [id number]   w/complications     Family History: Family History  Problem Relation Age of Onset   Stroke Mother    Breast cancer Mother    Heart attack Mother    Other Mother        brain tunor   Heart disease Father    Hypertension Father    Bladder Cancer Father    Parkinson's disease Father    Stroke Father    Breast cancer Brother    Atrial fibrillation Brother    Tremor Brother    Stroke Brother    Hypertension Brother    Breast cancer Maternal Grandmother    Heart disease Maternal Grandmother    Heart disease Maternal Grandfather    Arthritis Daughter    Heart disease Daughter    Colitis Daughter        Lymphocytic   Colon cancer Neg Hx     Social History: Social History   Socioeconomic History   Marital status: Married    Spouse name: Not on file   Number of children: 2   Years of education: Not on file   Highest education level: Master's  degree (e.g., MA, MS, MEng, MEd, MSW, MBA)  Occupational History   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, art   Occupation: Med Environmental education officer   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 He does not want heroic or futile care especially if he cannon communicate, read and enjoy his loved ones.     Social Determinants of Health    Financial Resource Strain: Low Risk  (03/23/2022)   Overall Financial Resource Strain (CARDIA)    Difficulty of Paying Living Expenses: Not hard at all  Food Insecurity: No Food Insecurity (03/23/2022)   Hunger Vital Sign    Worried About Running Out of Food in the Last Year: Never true    Ran Out of Food in the Last Year: Never true  Transportation Needs: No Transportation Needs (03/23/2022)   PRAPARE - Hydrologist (Medical): No    Lack of Transportation (Non-Medical): No  Physical Activity: Insufficiently Active (03/23/2022)   Exercise Vital Sign    Days of Exercise per Week: 2 days    Minutes of Exercise per Session: 60 min  Stress: No Stress Concern Present (03/23/2022)   [place]    Feeling of Stress : Not at all  Social Connections: Unknown (03/23/2022)   Social Connection and Isolation Panel [NHANES]    Frequency of Communication with Friends and Family: More than three times a week    Frequency of Social Gatherings with Friends and Family: Twice a week    Attends Religious Services: Patient refused    Marine scientist or Organizations: No    Attends Music therapist: Never    Marital Status: Married    Allergies: No Known Allergies  Outpatient Meds: Current Outpatient Medications  Medication Sig Dispense Refill   acetaminophen (TYLENOL) 325 MG tablet Take 2 tablets (650 mg total) by mouth every 6 (six) hours as needed for mild pain (or Fever >/= 101).     albuterol (VENTOLIN HFA) 108 (90 Base) MCG/ACT inhaler USE 2 INHALATIONS EVERY 6 HOURS AS NEEDED FOR WHEEZING OR SHORTNESS OF BREATH (Patient taking differently: Inhale 2 puffs into the lungs every 6 (six) hours as needed for shortness of breath or wheezing.) 54 g 3   amLODipine (NORVASC) 5 MG tablet TAKE 1 TABLET DAILY 90 tablet 0   BESIVANCE 0.6 % SUSP Place 1 drop into both eyes every 30 (thirty) days.      docusate sodium (COLACE) 100 MG capsule Take 1 capsule (100 mg total) by mouth daily as needed for up to 30 doses. 30 capsule 0   FLOVENT DISKUS 50 MCG/ACT AEPB USE 1 INHALATION TWICE A DAY (Patient taking differently: Take 1 Inhalation by mouth 2 (two) times daily.) 180 each 3   hypromellose (GENTEAL) 0.3 %  GEL ophthalmic ointment Place 1 Application into both eyes as needed for dry eyes. States it is liquid     MAGNESIUM GLUCONATE PO Take 1 tablet by mouth daily.     Methylcellulose, Laxative, (CITRUCEL PO) Take 2 tablets by mouth daily after breakfast. (Patient not taking: Reported on 11/02/2022)     morphine (MSIR) 15 MG tablet Take 0.5 tablets (7.5 mg total) by mouth every 4 (four) hours as needed for severe pain. (Patient not taking: Reported on 11/02/2022) 5 tablet 0   Multiple Vitamins-Minerals (PRESERVISION AREDS 2) CAPS Take 1 capsule by mouth 2 (two) times daily.     omeprazole (PRILOSEC) 40 MG capsule Take 1 capsule (40 mg total) by mouth daily. (Patient taking differently: Take 40 mg by mouth daily as needed (Acid reflux).) 90 capsule 0   ondansetron (ZOFRAN-ODT) 4 MG disintegrating tablet Take 1 tablet (4 mg total) by mouth every 4 (four) hours as needed for nausea/vomiting. 20 tablet 0   oxyCODONE-acetaminophen (PERCOCET) 5-325 MG tablet Take 1 tablet by mouth every 4 (four) hours as needed for up to 18 doses for severe pain. 18 tablet 0   potassium chloride SA (KLOR-CON M) 20 MEQ tablet TAKE 1 TABLET DAILY (Patient taking differently: Take 20 mEq by mouth daily.) 90 tablet 3   pravastatin (PRAVACHOL) 40 MG tablet TAKE 1 TABLET AT BEDTIME (Patient taking differently: Take 40 mg by mouth daily.) 90 tablet 3   rivaroxaban (XARELTO) 20 MG TABS tablet Take 1 tablet (20 mg total) by mouth daily. 90 tablet 1   tamsulosin (FLOMAX) 0.4 MG CAPS capsule Take 1 capsule (0.4 mg total) by mouth daily after supper. 30 capsule 0   valsartan (DIOVAN) 160 MG tablet TAKE 1 TABLET DAILY (Patient taking  differently: Take 160 mg by mouth daily.) 90 tablet 0   No current facility-administered medications for this visit.      ___________________________________________________________________ Objective   Exam:  There were no vitals taken for this visit. Wt Readings from Last 3 Encounters:  11/02/22 241 lb (109.3 kg)  10/29/22 241 lb 6.4 oz (109.5 kg)  10/27/22 243 lb (110.2 kg)    General: ***  Eyes: sclera anicteric, no redness ENT: oral mucosa moist without lesions, no cervical or supraclavicular lymphadenopathy CV: ***, no JVD, no peripheral edema Resp: clear to auscultation bilaterally, normal RR and effort noted GI: soft, *** tenderness, with active bowel sounds. No guarding or palpable organomegaly noted. Skin; warm and dry, no rash or jaundice noted Neuro: awake, alert and oriented x 3. Normal gross motor function and fluent speech  Labs:     Latest Ref Rng & Units 11/04/2022    8:53 AM 10/27/2022   10:30 AM 08/31/2022    2:57 PM  CBC  WBC 4.0 - 10.5 K/uL 6.3  3.4  3.3   Hemoglobin 13.0 - 17.0 g/dL 14.4  14.4  13.5   Hematocrit 39.0 - 52.0 % 43.5  44.1  41.3   Platelets 150 - 400 K/uL 172  108  108       Latest Ref Rng & Units 11/04/2022    8:53 AM 10/27/2022   10:30 AM 08/31/2022    2:57 PM  CMP  Glucose 70 - 99 mg/dL 128  114  110   BUN 8 - 23 mg/dL 33  23  24   Creatinine 0.61 - 1.24 mg/dL 1.06  1.20  1.08   Sodium 135 - 145 mmol/L 140  142  141   Potassium 3.5 -  5.1 mmol/L 3.6  4.2  4.3   Chloride 98 - 111 mmol/L 105  105  107   CO2 22 - 32 mmol/L '22  25  28   '$ Calcium 8.9 - 10.3 mg/dL 8.5  9.3  9.3   Total Protein 6.5 - 8.1 g/dL 6.7   6.9   Total Bilirubin 0.3 - 1.2 mg/dL 1.5   0.8   Alkaline Phos 38 - 126 U/L 45   42   AST 15 - 41 U/L 55   16   ALT 0 - 44 U/L 26   14      Radiologic Studies:  ***  Assessment: No diagnosis found.  ***  Plan:  ***  Thank you for the courtesy of this consult.  Please call me with any questions or  concerns.  Nelida Meuse III  CC: Referring provider noted above

## 2022-11-05 DIAGNOSIS — R350 Frequency of micturition: Secondary | ICD-10-CM | POA: Diagnosis not present

## 2022-11-06 LAB — URINE CULTURE: Culture: >=100000 — AB

## 2022-11-07 ENCOUNTER — Other Ambulatory Visit: Payer: Self-pay | Admitting: Urology

## 2022-11-07 ENCOUNTER — Telehealth (HOSPITAL_BASED_OUTPATIENT_CLINIC_OR_DEPARTMENT_OTHER): Payer: Self-pay | Admitting: *Deleted

## 2022-11-07 NOTE — Telephone Encounter (Signed)
Post ED Visit - Positive Culture Follow-up: Successful Patient Follow-Up  Culture assessed and recommendations reviewed by:  '[]'$  Elenor Quinones, Pharm.D. '[]'$  Heide Guile, Pharm.D., BCPS AQ-ID '[]'$  Parks Neptune, Pharm.D., BCPS '[]'$  Alycia Rossetti, Pharm.D., BCPS '[]'$  [place], [place].D., BCPS, AAHIVP '[]'$  Legrand Como, Pharm.D., BCPS, AAHIVP '[]'$  Salome Arnt, PharmD, BCPS '[]'$  Johnnette Gourd, PharmD, BCPS '[]'$  Hughes Better, PharmD, BCPS '[x]'$  Eilene Ghazi, PharmD  Positive urine culture  '[]'$  Patient discharged without antimicrobial prescription and treatment is now indicated '[x]'$  Organism is resistant to prescribed ED discharge antimicrobial '[]'$  Patient with positive blood cultures  Changes discussed with ED provider: Gareth Morgan New antibiotic prescription Amoxil '500mg'$  PO TID x 10 days D/C Cipro and Cefdinir Called to [facility] [address] Lady Gary, [place] patient, date 11/07/22, time 0908   Rosie Fate 11/07/2022, 9:09 AM

## 2022-11-07 NOTE — Progress Notes (Addendum)
ED Antimicrobial Stewardship Positive Culture Follow Up   Guy Moore is an 89 y.o. male who presented to [facility] on 11/04/2022 with a chief complaint of  Chief Complaint  Patient presents with   Abdominal Pain    Recent Results (from the past 720 hour(s))  Urine Culture     Status: Abnormal   Collection Time: 10/22/22 11:25 AM   Specimen: Urine  Result Value Ref Range Status   MICRO NUMBER: [id number]  Final   SPECIMEN QUALITY: Adequate  Final   Sample Source URINE  Final   STATUS: FINAL  Final   ISOLATE 1: Enterobacter cloacae complex (A)  Final    Comment: Greater than 100,000 CFU/mL of Enterobacter cloacae complex      Susceptibility   Enterobacter cloacae complex - URINE CULTURE, REFLEX    AMOX/CLAVULANIC 8 Resistant     CEFAZOLIN* >=64 Resistant      * For uncomplicated UTI caused by E. coli, K. pneumoniae or P. mirabilis: Cefazolin is susceptible if MIC <32 mcg/mL and predicts susceptible to the oral agents cefaclor, cefdinir, cefpodoxime, cefprozil, cefuroxime, cephalexin and loracarbef.     CEFTAZIDIME <=1 Sensitive     CEFEPIME <=1 Sensitive     CEFTRIAXONE <=1 Sensitive     CIPROFLOXACIN <=0.25 Sensitive     LEVOFLOXACIN <=0.12 Sensitive     GENTAMICIN <=1 Sensitive     IMIPENEM 1 Sensitive     NITROFURANTOIN 64 Intermediate     PIP/TAZO <=4 Sensitive     TOBRAMYCIN <=1 Sensitive     TRIMETH/SULFA* <=20 Sensitive      * For uncomplicated UTI caused by E. coli, K. pneumoniae or P. mirabilis: Cefazolin is susceptible if MIC <32 mcg/mL and predicts susceptible to the oral agents cefaclor, cefdinir, cefpodoxime, cefprozil, cefuroxime, cephalexin and loracarbef. Legend: S = Susceptible  I = Intermediate R = Resistant  NS = Not susceptible * = Not tested  NR = Not reported **NN = See antimicrobic comments   Urine Culture     Status: None   Collection Time: 10/29/22  1:48 PM   Specimen: Urine, Cystoscope  Result Value Ref Range Status   Specimen  Description   Final    PELVIS Performed at [place] [address] [place], [place] [address]    Special Requests   Final    NONE Performed at [facility], [place] [address], [place], [place] [address]    Culture   Final    NO GROWTH Performed at [facility], [place] [address], [place], [place] [address]    Report Status 10/30/2022 FINAL  Final  Urine Culture     Status: Abnormal   Collection Time: 11/04/22  8:53 AM   Specimen: Urine, Clean Catch  Result Value Ref Range Status   Specimen Description   Final    URINE, CLEAN CATCH Performed at [facility], [place] [address], [place], [place] [address]    Special Requests   Final    NONE Performed at [facility], [place] [address] [place], [place] [address]    Culture (A)  Final    >=100,000 COLONIES/mL STAPHYLOCOCCUS AUREUS >=100,000 COLONIES/mL ENTEROCOCCUS FAECALIS    Report Status 11/06/2022 FINAL  Final   Organism ID, Bacteria STAPHYLOCOCCUS AUREUS (A)  Final   Organism ID, Bacteria ENTEROCOCCUS FAECALIS (A)  Final      Susceptibility   Enterococcus faecalis - MIC*    AMPICILLIN <=2 SENSITIVE Sensitive  NITROFURANTOIN <=16 SENSITIVE Sensitive     VANCOMYCIN 1 SENSITIVE Sensitive     * >=100,000 COLONIES/mL ENTEROCOCCUS FAECALIS   Staphylococcus aureus - MIC*    CIPROFLOXACIN <=0.5 SENSITIVE Sensitive     GENTAMICIN <=0.5 SENSITIVE Sensitive     NITROFURANTOIN <=16 SENSITIVE Sensitive     OXACILLIN 0.5 SENSITIVE Sensitive     TETRACYCLINE <=1 SENSITIVE Sensitive     VANCOMYCIN <=0.5 SENSITIVE Sensitive     TRIMETH/SULFA <=10 SENSITIVE Sensitive     CLINDAMYCIN <=0.25 SENSITIVE Sensitive     RIFAMPIN <=0.5 SENSITIVE Sensitive     Inducible Clindamycin NEGATIVE Sensitive     * >=100,000 COLONIES/mL STAPHYLOCOCCUS AUREUS  Blood culture (routine x 2)     Status: None (Preliminary result)   Collection Time: 11/04/22  1:37 PM   Specimen:  Right Antecubital; Blood  Result Value Ref Range Status   Specimen Description   Final    RIGHT ANTECUBITAL BLOOD Performed at [facility], [address], [place], [place] [address]    Special Requests   Final    BOTTLES DRAWN AEROBIC AND ANAEROBIC Blood Culture adequate volume Performed at [place] [address], [place], [place] [address]    Culture   Final    NO GROWTH 3 DAYS Performed at [facility], [place] [address], [place], [place] [address]    Report Status PENDING  Incomplete  Blood culture (routine x 2)     Status: None (Preliminary result)   Collection Time: 11/04/22  1:42 PM   Specimen: BLOOD RIGHT HAND  Result Value Ref Range Status   Specimen Description   Final    BLOOD RIGHT HAND Performed at [facility], [place] [address], [place], [place] [address]    Special Requests   Final    BOTTLES DRAWN AEROBIC AND ANAEROBIC Blood Culture adequate volume Performed at [place] [address], [place], [place] [address]    Culture   Final    NO GROWTH 3 DAYS Performed at [facility], [place] [address], [place], [place] [address]    Report Status PENDING  Incomplete    '[x]'$  Treated with Cefdinir, Cipro, organisms resistant to prescribed antimicrobial '[]'$  Patient discharged originally without antimicrobial agent and treatment is now indicated  New antibiotic prescription: Amoxil '500mg'$  po TID x 10d  ED Provider: Gareth Morgan, [place] MSSA in urine reported to Scharlene Gloss, MD of ID Service per protocol  Alaric Gladwin S. Alford Highland, PharmD, BCPS Clinical Staff Pharmacist [email or web address] [place] 11/07/2022, 8:47 AM Clinical Pharmacist Monday - Friday phone -  [phone] Saturday - Sunday phone - [phone]

## 2022-11-08 NOTE — Patient Instructions (Signed)
SURGICAL WAITING ROOM VISITATION Patients having surgery or a procedure may have no more than 2 support people in the waiting area - these visitors may rotate in the visitor waiting room.   Due to an increase in RSV and influenza rates and associated hospitalizations, children ages 39 and under may not visit patients in [facility]. If the patient needs to stay at the hospital during part of their recovery, the visitor guidelines for inpatient rooms apply.  PRE-OP VISITATION  Pre-op nurse will coordinate an appropriate time for 1 support person to accompany the patient in pre-op.  This support person may not rotate.  This visitor will be contacted when the time is appropriate for the visitor to come back in the pre-op area.  Please refer to the [facility] website for the visitor guidelines for Inpatients (after your surgery is over and you are in a regular room).  You are not required to quarantine at this time prior to your surgery. However, you must do this: Hand Hygiene often Do NOT share personal items Notify your provider if you are in close contact with someone who has COVID or you develop fever 100.4 or greater, new onset of sneezing, cough, sore throat, shortness of breath or body aches.  If you test positive for Covid or have been in contact with anyone that has tested positive in the last 10 days please notify you surgeon.    Your procedure is scheduled on:  Friday  November 27, 2022  Report to [facility] Main Entrance: [place] entrance where the [employer] is available.   Report to admitting at:  05:15   AM  +++++Call this number if you have any questions or problems the morning of surgery [phone]  DO NOT EAT OR DRINK ANYTHING AFTER MIDNIGHT THE NIGHT PRIOR TO YOUR SURGERY / PROCEDURE.   FOLLOW BOWEL PREP AND ANY ADDITIONAL PRE OP INSTRUCTIONS YOU RECEIVED FROM YOUR SURGEON'S OFFICE!!!   Oral Hygiene is also important to reduce your risk of  infection.        Remember - BRUSH YOUR TEETH THE MORNING OF SURGERY WITH YOUR REGULAR TOOTHPASTE  These are anesthesia recommendations for holding your anticoagulants.  Please contact your prescribing physician to confirm IF it is safe to hold your anticoagulants for this length of time.  Xarelto Rivaroxaban   72 hours   XARELTO-   Take ONLY these medicines the morning of surgery with A SIP OF WATER: Amlodipine (Norvasc), If needed, you can take omeprazole (Prilosec), Zofran, Tylenol or Percocet. You may use your inhalers and Eye drops if needed.   If You have been diagnosed with Sleep Apnea- Bring CPAP mask and tubing day of surgery. We will provide you with a CPAP machine on the day of your surgery.                   You may not have any metal on your body including  jewelry, and body piercing  Do not wear lotions, powders, cologne, or deodorant  Men may shave face and neck.  Contacts, Hearing Aids, dentures or bridgework may not be worn into surgery. DENTURES WILL BE REMOVED PRIOR TO SURGERY PLEASE DO NOT APPLY "Poly grip" OR ADHESIVES!!!  Patients discharged on the day of surgery will not be allowed to drive home.  Someone NEEDS to stay with you for the first 24 hours after anesthesia.  Do not bring your home medications to the hospital. The Pharmacy will dispense medications listed  on your medication list to you during your admission in the Hospital.  Special Instructions: Bring a copy of your healthcare power of attorney and living will documents the day of surgery, if you wish to have them scanned into your [facility]- EPIC  Please read over the following fact sheets you were given: IF YOU HAVE QUESTIONS ABOUT YOUR PRE-OP INSTRUCTIONS, PLEASE CALL [phone]  ([place])   [facility] - Preparing for Surgery Before surgery, you can play an important role.  Because skin is not sterile, your skin needs to be as free of germs as possible.  You can reduce the number of  germs on your skin by washing with CHG (chlorahexidine gluconate) soap before surgery.  CHG is an antiseptic cleaner which kills germs and bonds with the skin to continue killing germs even after washing. Please DO NOT use if you have an allergy to CHG or antibacterial soaps.  If your skin becomes reddened/irritated stop using the CHG and inform your nurse when you arrive at Short Stay. Do not shave (including legs and underarms) for at least 48 hours prior to the first CHG shower.  You may shave your face/neck.  Please follow these instructions carefully:  1.  Shower with CHG Soap the night before surgery and the  morning of surgery.  2.  If you choose to wash your hair, wash your hair first as usual with your normal  shampoo.  3.  After you shampoo, rinse your hair and body thoroughly to remove the shampoo.                             4.  Use CHG as you would any other liquid soap.  You can apply chg directly to the skin and wash.  Gently with a scrungie or clean washcloth.  5.  Apply the CHG Soap to your body ONLY FROM THE NECK DOWN.   Do not use on face/ open                           Wound or open sores. Avoid contact with eyes, ears mouth and genitals (private parts).                       Wash face,  Genitals (private parts) with your normal soap.             6.  Wash thoroughly, paying special attention to the area where your  surgery  will be performed.  7.  Thoroughly rinse your body with warm water from the neck down.  8.  DO NOT shower/wash with your normal soap after using and rinsing off the CHG Soap.            9.  Pat yourself dry with a clean towel.            10.  Wear clean pajamas.            11.  Place clean sheets on your bed the night of your first shower and do not  sleep with pets.  ON THE DAY OF SURGERY : Do not apply any lotions/deodorants the morning of surgery.  Please wear clean clothes to the hospital/surgery center.    FAILURE TO FOLLOW THESE INSTRUCTIONS MAY  RESULT IN THE CANCELLATION OF YOUR SURGERY  PATIENT SIGNATURE_________________________________  NURSE SIGNATURE__________________________________  ________________________________________________________________________

## 2022-11-08 NOTE — Progress Notes (Signed)
COVID Vaccine received:  []  No [x]  Yes Date of any COVID positive Test in last 90 days:  PCP - Alysia Penna, MD Cardiologist -  EP- Allegra Lai, MD  Pulmonary- [name],  MD  Chest x-ray - 05-25-2021  1v  Epic EKG -  11-04-2022  Epic Stress Test -  ECHO - 2011  Epic Cardiac Cath -  CT chest  05-25-2021  Epic  PCR screen: []  Ordered & Completed                      []   No Order but Needs PROFEND                      [x]   N/A for this surgery  Surgery Plan:  [x]  Ambulatory                            []  Outpatient in bed                            []  Admit  Anesthesia:    [x]  General  []  Spinal                           []   Choice []   MAC  Bowel Prep - [x]  No  []   Yes _____________  Pacemaker / ICD device []  No [x]  Yes  Medtronic Azure XT dual chamber:  Last checked: 08-18-2022      Device order form faxed []  No    []   Yes      Faxed to:  Patient not cleared for surgery, Preop Phone call is set up for 11-12-22.   Spinal Cord Stimulator:[x]  No []  Yes      (Remind patient to bring remote DOS) Other Implants:   History of Sleep Apnea? []  No [x]  Yes   CPAP used?- []  No []  Yes    Does the patient monitor blood sugar? [x]  No []  Yes  []  N/A     Patient denies,  no meds, diet only Patient has: [x]  Pre-DM   []  DM1  []   DM2 Does patient have a [employer] or Dexacom? []  No []  Yes   Fasting Blood Sugar Ranges-  Checks Blood Sugar __0___ times a day  Blood Thinner / Instructions:  Xarelto  Aspirin Instructions:  none  ERAS Protocol Ordered: [x]  No  []  Yes  Patient is to be NPO after:midnight prior  Comments:   Activity level: Patient can / can not climb a flight of stairs without difficulty; []  No CP  []  No SOB, but would have ______   Patient can / can not perform ADLs without assistance.   Anesthesia review: HTN, A.fib (Ablations x4 [facility]), has PPM for SSS, Thrombocytopenia, COPD, Pre-DM (Pt denies),OSA- CPAP?, Macular degeneration, Hx UPPP in 1980,    Patient denies shortness of breath, fever, cough and chest pain at PAT appointment.  Patient verbalized understanding and agreement to the Pre-Surgical Instructions that were given to them at this PAT appointment. Patient was also educated of the need to review these PAT instructions again prior to his/her surgery.I reviewed the appropriate phone numbers to call if they have any and questions or concerns.

## 2022-11-09 DIAGNOSIS — N3000 Acute cystitis without hematuria: Secondary | ICD-10-CM | POA: Diagnosis not present

## 2022-11-09 DIAGNOSIS — N200 Calculus of kidney: Secondary | ICD-10-CM | POA: Diagnosis not present

## 2022-11-09 DIAGNOSIS — R338 Other retention of urine: Secondary | ICD-10-CM | POA: Diagnosis not present

## 2022-11-09 DIAGNOSIS — N281 Cyst of kidney, acquired: Secondary | ICD-10-CM | POA: Diagnosis not present

## 2022-11-09 LAB — CULTURE, BLOOD (ROUTINE X 2)
Culture: NO GROWTH
Culture: NO GROWTH
Special Requests: ADEQUATE
Special Requests: ADEQUATE

## 2022-11-10 ENCOUNTER — Encounter (HOSPITAL_COMMUNITY)
Admission: RE | Admit: 2022-11-10 | Discharge: 2022-11-10 | Disposition: A | Discharge status: Home / Self Care | Payer: Medicare Other | Source: Ambulatory Visit | Attending: Urology | Admitting: Urology

## 2022-11-10 ENCOUNTER — Other Ambulatory Visit: Payer: Self-pay

## 2022-11-10 ENCOUNTER — Encounter (HOSPITAL_COMMUNITY): Payer: Self-pay

## 2022-11-10 VITALS — BP 150/85 | HR 72 | Temp 98.3°F | Resp 16 | Ht 70.0 in | Wt 232.0 lb

## 2022-11-10 DIAGNOSIS — N201 Calculus of ureter: Secondary | ICD-10-CM | POA: Diagnosis not present

## 2022-11-10 DIAGNOSIS — R7303 Prediabetes: Secondary | ICD-10-CM | POA: Diagnosis not present

## 2022-11-10 DIAGNOSIS — R338 Other retention of urine: Secondary | ICD-10-CM | POA: Diagnosis not present

## 2022-11-10 DIAGNOSIS — Z01812 Encounter for preprocedural laboratory examination: Secondary | ICD-10-CM | POA: Insufficient documentation

## 2022-11-10 HISTORY — DX: Chronic kidney disease, unspecified: N18.9

## 2022-11-10 LAB — HEMOGLOBIN A1C
Hgb A1c MFr Bld: 6.2 % — ABNORMAL HIGH (ref 4.8–5.6)
Mean Plasma Glucose: 131.24 mg/dL

## 2022-11-10 LAB — GLUCOSE, CAPILLARY: Glucose-Capillary: 132 mg/dL — ABNORMAL HIGH (ref 70–99)

## 2022-11-12 ENCOUNTER — Ambulatory Visit: Payer: Medicare Other | Attending: Internal Medicine | Admitting: Physician Assistant

## 2022-11-12 ENCOUNTER — Encounter: Payer: Self-pay | Admitting: Cardiology

## 2022-11-12 DIAGNOSIS — Z01810 Encounter for preprocedural cardiovascular examination: Secondary | ICD-10-CM | POA: Diagnosis not present

## 2022-11-12 NOTE — Progress Notes (Addendum)
Virtual Visit via Telephone Note   Because of Guy Moore's co-morbid illnesses, he is at least at moderate risk for complications without adequate follow up.  This format is felt to be most appropriate for this patient at this time.  The patient did not have access to video technology/had technical difficulties with video requiring transitioning to audio format only (telephone).  All issues noted in this document were discussed and addressed.  No physical exam could be performed with this format.  Please refer to the patient's chart for his consent to telehealth for [facility].  Evaluation Performed:  Preoperative cardiovascular risk assessment _____________   Date:  11/12/2022   Patient ID:  Guy Moore, DOB [birth year], MRN [id number] Patient Location:  Home Provider location:   Office  Primary Care Provider:  Laurey Morale, MD Primary Cardiologist:  None  Chief Complaint / Patient Profile   89 y.o. y/o male with a h/o atrial fibrillation and SVT s/p multiple ablation, SSS s/p Medtronic dual-chamber PPM 11/04/2016, hypertension, hyperlipidemia, and OSA who is pending cystoscopy and right retrograde pyelogram with right laser lithotripsy and presents today for telephonic preoperative cardiovascular risk assessment.  History of Present Illness    Guy Moore is a 89 y.o. male who presents via audio/video conferencing for a telehealth visit today.  Pt was last seen in cardiology clinic on 02/23/2022 by Dr. Curt Bears.  At that time Guy Moore was doing well.  The patient is now pending procedure as outlined above. Since his last visit, he has been doing well without exertional chest pain or worsening dyspnea.  He has been compliant with his medication.  The case was reviewed by our clinical pharmacist who recommended to hold Xarelto for 2 days prior to the procedure based on his creatinine clearance and age.  Past Medical History    Past Medical History:   Diagnosis Date   Allergic rhinitis    Arthritis    Asthma    Atrial fibrillation [facility])    sees Dr. [name] at [facility]    Atrial flutter ([place])    BCC (basal cell carcinoma of skin)    Nose   Chronic kidney disease    Complication of anesthesia    COPD with emphysema ([place])    sees Dr. [name]    Depression    Diabetes mellitus ([place])    Diet control , hyperglycemic but not diabetic per Pt 10/2022   Diverticulosis    Erosive esophagitis    Esophageal stricture    Gastric ulcer    Hearing loss    uses amplification   Hemorrhoids    Hiatal hernia    HTN (hypertension)    Hyperlipidemia    Knee problem    2% permanent partial impairment Right   Macular degeneration    Perineal abscess    Pneumonia    Sleep apnea    Past Surgical History:  Procedure Laterality Date   CARDIAC ELECTROPHYSIOLOGY STUDY AND ABLATION     x 4 (sees Dr. [name] at [facility])   [place] Right 10/29/2022   Procedure: CYSTOSCOPY WITH RETROGRADE WITH URETHERAL [place] DIAGNOSTIC  URETEROSCOPY AND STENT PLACEMENT;  Surgeon: Janith Lima, MD;  Location: [facility] ORS;  Service: Urology;  Laterality: Right;   INSERT / REPLACE / REMOVE PACEMAKER     NOSE SURGERY     septoplasty   UVULOPALATOPHARYNGOPLASTY  06/13/1979   VASECTOMY  27/78/2423   w/complications    Allergies  No Known Allergies  Home Medications    Prior to Admission medications   Medication Sig Start Date End Date Taking? Authorizing Provider  acetaminophen (TYLENOL) 325 MG tablet Take 2 tablets (650 mg total) by mouth every 6 (six) hours as needed for mild pain (or Fever >/= 101). 05/28/21   Elgergawy, Silver Huguenin, MD  albuterol (VENTOLIN HFA) 108 (90 Base) MCG/ACT inhaler USE 2 INHALATIONS EVERY 6 HOURS AS NEEDED FOR WHEEZING OR SHORTNESS OF BREATH Patient taking differently: Inhale 2 puffs into the lungs every 6 (six) hours as  needed for shortness of breath or wheezing. 02/03/21   [name], MD  amLODipine (NORVASC) 5 MG tablet TAKE 1 TABLET DAILY 07/29/22   Laurey Morale, MD  BESIVANCE 0.6 % SUSP Place 1 drop into both eyes every 30 (thirty) days. 07/24/20   [provider]  cefdinir (OMNICEF) 300 MG capsule Take 1 capsule (300 mg total) by mouth 2 (two) times daily. 11/04/22   Rancour, Annie Main, MD  docusate sodium (COLACE) 100 MG capsule Take 1 capsule (100 mg total) by mouth daily as needed for up to 30 doses. 10/29/22   Janith Lima, MD  FLOVENT DISKUS 50 MCG/ACT AEPB USE 1 INHALATION TWICE A DAY Patient taking differently: Take 1 Inhalation by mouth 2 (two) times daily. 01/15/22   [name], MD  hypromellose (GENTEAL) 0.3 % GEL ophthalmic ointment Place 1 Application into both eyes as needed for dry eyes. States it is liquid 06/26/21   [provider]  MAGNESIUM GLUCONATE PO Take 1 tablet by mouth daily.    [provider]  Methylcellulose, Laxative, (CITRUCEL PO) Take 2 tablets by mouth daily after breakfast. Patient not taking: Reported on 11/02/2022    [provider]  morphine (MSIR) 15 MG tablet Take 0.5 tablets (7.5 mg total) by mouth every 4 (four) hours as needed for severe pain. Patient not taking: Reported on 11/02/2022 10/27/22   Deno Etienne, DO  Multiple Vitamins-Minerals (PRESERVISION AREDS 2) CAPS Take 1 capsule by mouth 2 (two) times daily.    [provider]  omeprazole (PRILOSEC) 40 MG capsule Take 1 capsule (40 mg total) by mouth daily. Patient taking differently: Take 40 mg by mouth daily as needed (Acid reflux). 04/30/22   Laurey Morale, MD  ondansetron (ZOFRAN-ODT) 4 MG disintegrating tablet Take 1 tablet (4 mg total) by mouth every 4 (four) hours as needed for nausea/vomiting. 10/27/22   Deno Etienne, DO  oxyCODONE-acetaminophen (PERCOCET) 5-325 MG tablet Take 1 tablet by mouth every 4 (four) hours as needed for up to 18 doses for severe pain. 10/29/22    Janith Lima, MD  potassium chloride SA (KLOR-CON M) 20 MEQ tablet TAKE 1 TABLET DAILY Patient taking differently: Take 20 mEq by mouth daily. 09/10/22   Laurey Morale, MD  pravastatin (PRAVACHOL) 40 MG tablet TAKE 1 TABLET AT BEDTIME Patient taking differently: Take 40 mg by mouth daily. 09/10/22   Laurey Morale, MD  rivaroxaban (XARELTO) 20 MG TABS tablet Take 1 tablet (20 mg total) by mouth daily. 05/08/22   Laurey Morale, MD  tamsulosin (FLOMAX) 0.4 MG CAPS capsule Take 1 capsule (0.4 mg total) by mouth daily after supper. 10/27/22   Deno Etienne, DO  valsartan (DIOVAN) 160 MG tablet TAKE 1 TABLET DAILY Patient taking differently: Take 160 mg by mouth daily. 07/29/22   Laurey Morale, MD  Physical Exam    Vital Signs:  Guy Moore does not have vital signs available for review today.  Given telephonic nature of communication, physical exam is limited. AAOx3. NAD. Normal affect.  Speech and respirations are unlabored.  Accessory Clinical Findings    None  Assessment & Plan    1.  Preoperative Cardiovascular Risk Assessment:  -Guy Moore is a pleasant 89 year old male with past medical history of A-fib status post multiple ablation, sick sinus syndrome s/p Medtronic PPM, hypertension, hyperlipidemia and OSA who is undergoing cystoscopy with right pyelogram and laser lithotripsy by Dr. Abner Greenspan.  He does not have a history of MI.  He denies any recent exertional chest pain or worsening dyspnea.  He is at acceptable risk to proceed from the cardiac perspective without further workup.  At the recommendation of our clinical pharmacist based on his creatinine clearance and age, we recommend hold Xarelto for 2 days prior to the procedure and restart as soon as possible afterward at the discretion of surgeon based on bleeding risk.  The patient was advised that if he develops new symptoms prior to surgery to contact our office to arrange for a follow-up visit, and he verbalized  understanding.   The case was reviewed by our device clinic: Per device clinic recommendation Device Information:   Clinic EP Physician:  Allegra Lai, MD    Device Type:  Pacemaker Manufacturer and Phone #:  Medtronic: [id number] Pacemaker Dependent?:  No. Date of Last Device Check:  08/17/2022       Normal Device Function?:  Yes.     Electrophysiologist's Recommendations:   Have magnet available. Provide continuous ECG monitoring when magnet is used or reprogramming is to be performed.  Procedure may interfere with device function.  Magnet should be placed over device during procedure.   Per Device Clinic Standing Orders, Damian Leavell, RN  2:41 PM 11/12/2022    A copy of this note will be routed to requesting surgeon.  Time:   Today, I have spent 7 minutes with the patient with telehealth technology discussing medical history, symptoms, and management plan.     [place], [place]  11/12/2022, 2:49 PM

## 2022-11-12 NOTE — Telephone Encounter (Signed)
See separate virtual visit note from today, I have forwarded my note to the surgeon's office.

## 2022-11-12 NOTE — Progress Notes (Signed)
PERIOPERATIVE PRESCRIPTION FOR IMPLANTED CARDIAC DEVICE PROGRAMMING  Patient Information: Name:  Guy Moore  DOB:  1934-08-10  MRN:  [id number]  Planned Procedure:  Cysto, pyelo, holmium laser, right stent placement, penile biopsy  Surgeon: Abner Greenspan  Date of Procedure: 11-27-2022  Cautery will be used.  Position during surgery: Supine   Device Information:  Clinic EP Physician:  Allegra Lai, MD   Device Type:  Pacemaker Manufacturer and Phone #:  Medtronic: [id number] Pacemaker Dependent?:  No. Date of Last Device Check:  08/17/2022 Normal Device Function?:  Yes.    Electrophysiologist's Recommendations:  Have magnet available. Provide continuous ECG monitoring when magnet is used or reprogramming is to be performed.  Procedure may interfere with device function.  Magnet should be placed over device during procedure.  Per Device Clinic Standing Orders, Damian Leavell, RN  2:41 PM 11/12/2022

## 2022-11-13 NOTE — Progress Notes (Signed)
  Emails sent x 2 d/t no reply on the first email. Emailed [employer] Circle and Kenyon Ana on 11-17-22    Email sent to [employer] Rep:  Golden Circle  We have a surgery with Dr Abner Greenspan on 11-27-22 and the patient has a Medtronic Azure XT dual chamber PPM.   Patient:  Guy Moore. Grainger Mccarley 28-Aug-1934 Surgeon:  Dr. Rexene Alberts Surgery: Cystoscopy, Right retrograde pyelogram, Right holmium laser and stent placement, Penile biopsy.  Surgery Place / time: [facility] currently scheduled on 11-27-22 at 0730 to [place].   If you have any questions, please let me know,   Leota Jacobsen, BSN, CVRN-BC Pre-Surgical Testing, Short Stay [facility] [phone]

## 2022-11-16 ENCOUNTER — Ambulatory Visit: Payer: Medicare Other

## 2022-11-16 DIAGNOSIS — I4819 Other persistent atrial fibrillation: Secondary | ICD-10-CM | POA: Diagnosis not present

## 2022-11-16 DIAGNOSIS — R338 Other retention of urine: Secondary | ICD-10-CM | POA: Diagnosis not present

## 2022-11-16 DIAGNOSIS — N4829 Other inflammatory disorders of penis: Secondary | ICD-10-CM | POA: Diagnosis not present

## 2022-11-17 ENCOUNTER — Telehealth: Payer: Medicare Other | Admitting: Family Medicine

## 2022-11-17 LAB — CUP PACEART REMOTE DEVICE CHECK
Battery Remaining Longevity: 97 mo
Battery Voltage: 2.99 V
Brady Statistic AP VP Percent: 0 %
Brady Statistic AP VS Percent: 0 %
Brady Statistic AS VP Percent: 84.18 %
Brady Statistic AS VS Percent: 15.82 %
Brady Statistic RA Percent Paced: 0 %
Brady Statistic RV Percent Paced: 84.18 %
Date Time Interrogation Session: 20240205025834
Implantable Lead Connection Status: 753985
Implantable Lead Connection Status: 753985
Implantable Lead Implant Date: 20180124
Implantable Lead Implant Date: 20180124
Implantable Lead Location: 753859
Implantable Lead Location: 753860
Implantable Lead Model: 5076
Implantable Lead Model: 5076
Implantable Pulse Generator Implant Date: 20180124
Lead Channel Impedance Value: 304 Ohm
Lead Channel Impedance Value: 323 Ohm
Lead Channel Impedance Value: 399 Ohm
Lead Channel Impedance Value: 418 Ohm
Lead Channel Pacing Threshold Amplitude: 0.875 V
Lead Channel Pacing Threshold Pulse Width: 0.4 ms
Lead Channel Sensing Intrinsic Amplitude: 0.25 mV
Lead Channel Sensing Intrinsic Amplitude: 0.25 mV
Lead Channel Sensing Intrinsic Amplitude: 5.375 mV
Lead Channel Sensing Intrinsic Amplitude: 5.375 mV
Lead Channel Setting Pacing Amplitude: 1.75 V
Lead Channel Setting Pacing Pulse Width: 0.4 ms
Lead Channel Setting Sensing Sensitivity: 0.9 mV
Zone Setting Status: 755011

## 2022-11-17 NOTE — Progress Notes (Signed)
Anesthesia Chart Review   Case: [id number] Date/Time: 11/27/22 0715   Procedures:      CYSTOSCOPY/ RIGHT RETROGRADE PYELOGRAM/RIGHT HOLMIUM LASER/RIGHT STENT PLACEMENT/BASKET (Right) - 60 MINUTES NEEDED FOR CASE     PENILE BIOPSY   Anesthesia type: General   Pre-op diagnosis: RIGHT URETERAL STONE,RIGHT URETERAL AND RENAL STONE AND PEINILE LESION   Location: [facility] PROCEDURE [facility] / [facility]   Surgeons: Janith Lima, MD       DISCUSSION:88 y.o. former smoker with h/o HTN, atrial fibrillation, SSS s/p Medtronic dual-chamber PPM 11/04/2016 (device orders in 11/12/22 progress note), CKD, sleep apnea, COPD, right ureteral and renal stone scheduled for above procedure 11/27/2022 with Dr. Rexene Alberts.   Pt last seen by cardiology 11/12/2022. Per OV note, " -Mr. Sans is a pleasant 89 year old male with past medical history of A-fib status post multiple ablation, sick sinus syndrome s/p Medtronic PPM, hypertension, hyperlipidemia and OSA who is undergoing cystoscopy with right pyelogram and laser lithotripsy by Dr. Abner Greenspan.  He does not have a history of MI.  He denies any recent exertional chest pain or worsening dyspnea.  He is at acceptable risk to proceed from the cardiac perspective without further workup.  At the recommendation of our clinical pharmacist based on his creatinine clearance and age, we recommend hold Xarelto for 2 days prior to the procedure and restart as soon as possible afterward at the discretion of surgeon based on bleeding risk."  Anticipate pt can proceed with planned procedure barring acute status change.   VS: There were no vitals taken for this visit.  PROVIDERS: Laurey Morale, MD is PCP    LABS: Labs reviewed: Acceptable for surgery. (all labs ordered are listed, but only abnormal results are displayed)  Labs Reviewed - No data to display   IMAGES:   EKG:   CV:  Past Medical History:  Diagnosis Date   Allergic rhinitis    Arthritis    Asthma    Atrial fibrillation  ([place])    sees Dr. [name] at [facility]    Atrial flutter ([place])    BCC (basal cell carcinoma of skin)    Nose   Chronic kidney disease    Complication of anesthesia    COPD with emphysema ([place])    sees Dr. [name]    Depression    Diabetes mellitus ([place])    Diet control , hyperglycemic but not diabetic per Pt 10/2022   Diverticulosis    Erosive esophagitis    Esophageal stricture    Gastric ulcer    Hearing loss    uses amplification   Hemorrhoids    Hiatal hernia    HTN (hypertension)    Hyperlipidemia    Knee problem    2% permanent partial impairment Right   Macular degeneration    Perineal abscess    Pneumonia    Sleep apnea     Past Surgical History:  Procedure Laterality Date   CARDIAC ELECTROPHYSIOLOGY STUDY AND ABLATION     x 4 (sees Dr. [name] at [facility])   [place] Right 10/29/2022   Procedure: CYSTOSCOPY WITH RETROGRADE WITH URETHERAL [place] DI[facility]NOSTIC  URETEROSCOPY AND STENT PLACEMENT;  Surgeon: Janith Lima, MD;  Location: [facility];  Service: Urology;  Laterality: Right;   INSERT / REPLACE / REMOVE PACEMAKER     NOSE SURGERY     septoplasty   UVULOPALATOPHARYNGOPLASTY  06/13/1979  VASECTOMY  91/69/4503   w/complications    MEDICATIONS: No current facility-administered medications for this encounter.    acetaminophen (TYLENOL) 325 MG tablet   albuterol (VENTOLIN HFA) 108 (90 Base) MCG/ACT inhaler   amLODipine (NORVASC) 5 MG tablet   BESIVANCE 0.6 % SUSP   FLOVENT DISKUS 50 MCG/ACT AEPB   hypromellose (GENTEAL) 0.3 % GEL ophthalmic ointment   MAGNESIUM GLUCONATE PO   Methylcellulose, Laxative, (CITRUCEL PO)   morphine (MSIR) 15 MG tablet   Multiple Vitamins-Minerals (PRESERVISION AREDS 2) CAPS   omeprazole (PRILOSEC) 40 MG capsule   potassium chloride SA (KLOR-CON M) 20 MEQ tablet   pravastatin (PRAVACHOL) 40 MG tablet    rivaroxaban (XARELTO) 20 MG TABS tablet   tamsulosin (FLOMAX) 0.4 MG CAPS capsule   valsartan (DIOVAN) 160 MG tablet   cefdinir (OMNICEF) 300 MG capsule   docusate sodium (COLACE) 100 MG capsule   ondansetron (ZOFRAN-ODT) 4 MG disintegrating tablet   oxyCODONE-acetaminophen (PERCOCET) 5-325 MG tablet    Konrad Felix Ward, PA-C [facility] Pre-Surgical Testing [phone]

## 2022-11-20 ENCOUNTER — Other Ambulatory Visit: Payer: Self-pay | Admitting: Family Medicine

## 2022-11-23 ENCOUNTER — Encounter (INDEPENDENT_AMBULATORY_CARE_PROVIDER_SITE_OTHER): Payer: Medicare Other | Admitting: Ophthalmology

## 2022-11-23 DIAGNOSIS — I10 Essential (primary) hypertension: Secondary | ICD-10-CM

## 2022-11-23 DIAGNOSIS — H43813 Vitreous degeneration, bilateral: Secondary | ICD-10-CM | POA: Diagnosis not present

## 2022-11-23 DIAGNOSIS — H35033 Hypertensive retinopathy, bilateral: Secondary | ICD-10-CM | POA: Diagnosis not present

## 2022-11-23 DIAGNOSIS — D3131 Benign neoplasm of right choroid: Secondary | ICD-10-CM

## 2022-11-23 DIAGNOSIS — H353231 Exudative age-related macular degeneration, bilateral, with active choroidal neovascularization: Secondary | ICD-10-CM

## 2022-11-26 ENCOUNTER — Encounter (HOSPITAL_COMMUNITY): Payer: Self-pay | Admitting: Urology

## 2022-11-27 ENCOUNTER — Encounter (HOSPITAL_COMMUNITY)
Admission: RE | Disposition: A | Discharge status: Home / Self Care | Payer: Self-pay | Source: Home / Self Care | Attending: Urology

## 2022-11-27 ENCOUNTER — Encounter (HOSPITAL_COMMUNITY): Payer: Self-pay | Admitting: Urology

## 2022-11-27 ENCOUNTER — Ambulatory Visit (HOSPITAL_COMMUNITY): Payer: Medicare Other | Admitting: Physician Assistant

## 2022-11-27 ENCOUNTER — Ambulatory Visit (HOSPITAL_BASED_OUTPATIENT_CLINIC_OR_DEPARTMENT_OTHER): Payer: Medicare Other | Admitting: Physician Assistant

## 2022-11-27 ENCOUNTER — Ambulatory Visit (HOSPITAL_COMMUNITY): Payer: Medicare Other

## 2022-11-27 ENCOUNTER — Other Ambulatory Visit (HOSPITAL_BASED_OUTPATIENT_CLINIC_OR_DEPARTMENT_OTHER): Payer: Self-pay

## 2022-11-27 ENCOUNTER — Ambulatory Visit (HOSPITAL_COMMUNITY)
Admission: RE | Admit: 2022-11-27 | Discharge: 2022-11-27 | Disposition: A | Discharge status: Home / Self Care | Payer: Medicare Other | Attending: Urology | Admitting: Urology

## 2022-11-27 DIAGNOSIS — R0602 Shortness of breath: Secondary | ICD-10-CM | POA: Diagnosis not present

## 2022-11-27 DIAGNOSIS — N4889 Other specified disorders of penis: Secondary | ICD-10-CM | POA: Diagnosis not present

## 2022-11-27 DIAGNOSIS — J449 Chronic obstructive pulmonary disease, unspecified: Secondary | ICD-10-CM | POA: Diagnosis not present

## 2022-11-27 DIAGNOSIS — E1122 Type 2 diabetes mellitus with diabetic chronic kidney disease: Secondary | ICD-10-CM | POA: Diagnosis not present

## 2022-11-27 DIAGNOSIS — N202 Calculus of kidney with calculus of ureter: Secondary | ICD-10-CM | POA: Diagnosis not present

## 2022-11-27 DIAGNOSIS — Z950 Presence of cardiac pacemaker: Secondary | ICD-10-CM | POA: Diagnosis not present

## 2022-11-27 DIAGNOSIS — Z87440 Personal history of urinary (tract) infections: Secondary | ICD-10-CM | POA: Diagnosis not present

## 2022-11-27 DIAGNOSIS — I129 Hypertensive chronic kidney disease with stage 1 through stage 4 chronic kidney disease, or unspecified chronic kidney disease: Secondary | ICD-10-CM | POA: Insufficient documentation

## 2022-11-27 DIAGNOSIS — K219 Gastro-esophageal reflux disease without esophagitis: Secondary | ICD-10-CM | POA: Diagnosis not present

## 2022-11-27 DIAGNOSIS — I10 Essential (primary) hypertension: Secondary | ICD-10-CM | POA: Insufficient documentation

## 2022-11-27 DIAGNOSIS — Z87891 Personal history of nicotine dependence: Secondary | ICD-10-CM | POA: Diagnosis not present

## 2022-11-27 DIAGNOSIS — J439 Emphysema, unspecified: Secondary | ICD-10-CM | POA: Diagnosis not present

## 2022-11-27 DIAGNOSIS — N201 Calculus of ureter: Secondary | ICD-10-CM

## 2022-11-27 DIAGNOSIS — G4730 Sleep apnea, unspecified: Secondary | ICD-10-CM | POA: Insufficient documentation

## 2022-11-27 DIAGNOSIS — I4891 Unspecified atrial fibrillation: Secondary | ICD-10-CM | POA: Diagnosis not present

## 2022-11-27 DIAGNOSIS — N189 Chronic kidney disease, unspecified: Secondary | ICD-10-CM | POA: Diagnosis not present

## 2022-11-27 DIAGNOSIS — N4231 Prostatic intraepithelial neoplasia: Secondary | ICD-10-CM | POA: Diagnosis not present

## 2022-11-27 DIAGNOSIS — D074 Carcinoma in situ of penis: Secondary | ICD-10-CM | POA: Diagnosis not present

## 2022-11-27 DIAGNOSIS — K449 Diaphragmatic hernia without obstruction or gangrene: Secondary | ICD-10-CM | POA: Insufficient documentation

## 2022-11-27 DIAGNOSIS — N132 Hydronephrosis with renal and ureteral calculous obstruction: Secondary | ICD-10-CM | POA: Diagnosis not present

## 2022-11-27 DIAGNOSIS — R7303 Prediabetes: Secondary | ICD-10-CM

## 2022-11-27 HISTORY — PX: PENILE BIOPSY: SHX6013

## 2022-11-27 HISTORY — PX: CYSTOSCOPY/URETEROSCOPY/HOLMIUM LASER/STENT PLACEMENT: SHX6546

## 2022-11-27 SURGERY — CYSTOSCOPY/URETEROSCOPY/HOLMIUM LASER/STENT PLACEMENT
Anesthesia: General | Laterality: Right

## 2022-11-27 MED ORDER — FENTANYL CITRATE PF 50 MCG/ML IJ SOSY: 25.0000 ug | PREFILLED_SYRINGE | INTRAMUSCULAR | Status: DC | PRN

## 2022-11-27 MED ORDER — CEFAZOLIN SODIUM-DEXTROSE 2-4 GM/100ML-% IV SOLN: 2.0000 g | INTRAVENOUS | Status: DC

## 2022-11-27 MED ORDER — ONDANSETRON HCL 4 MG/2ML IJ SOLN: 4.0000 mg | Freq: Once | INTRAMUSCULAR | Status: DC | PRN

## 2022-11-27 MED ORDER — ONDANSETRON HCL 4 MG/2ML IJ SOLN
INTRAMUSCULAR | Status: AC
  Filled 2022-11-27: qty 10

## 2022-11-27 MED ORDER — EPHEDRINE 5 MG/ML INJ
INTRAVENOUS | Status: AC
  Filled 2022-11-27: qty 5

## 2022-11-27 MED ORDER — ORAL CARE MOUTH RINSE: 15.0000 mL | Freq: Once | OROMUCOSAL | Status: AC

## 2022-11-27 MED ORDER — LIDOCAINE HCL 1 % IJ SOLN
INTRAMUSCULAR | Status: DC | PRN
  Administered 2022-11-27: 10 mL

## 2022-11-27 MED ORDER — PROPOFOL 10 MG/ML IV BOLUS
INTRAVENOUS | Status: AC
  Filled 2022-11-27: qty 20

## 2022-11-27 MED ORDER — CEFAZOLIN SODIUM-DEXTROSE 2-4 GM/100ML-% IV SOLN
2.0000 g | INTRAVENOUS | Status: AC
  Administered 2022-11-27: 2 g via INTRAVENOUS
  Filled 2022-11-27: qty 100

## 2022-11-27 MED ORDER — LIDOCAINE HCL (PF) 2 % IJ SOLN
INTRAMUSCULAR | Status: AC
  Filled 2022-11-27: qty 25

## 2022-11-27 MED ORDER — LIDOCAINE HCL 1 % IJ SOLN
INTRAMUSCULAR | Status: AC
  Filled 2022-11-27: qty 20

## 2022-11-27 MED ORDER — PROPOFOL 10 MG/ML IV BOLUS
INTRAVENOUS | Status: DC | PRN
  Administered 2022-11-27: 150 mg via INTRAVENOUS

## 2022-11-27 MED ORDER — SODIUM CHLORIDE 0.9 % IR SOLN
Status: DC | PRN
  Administered 2022-11-27: 6000 mL via INTRAVESICAL

## 2022-11-27 MED ORDER — OXYCODONE-ACETAMINOPHEN 5-325 MG PO TABS
1.0000 | ORAL_TABLET | ORAL | 0 refills | Status: DC | PRN
  Filled 2022-11-27: qty 18, 3d supply, fill #0

## 2022-11-27 MED ORDER — IOHEXOL 300 MG/ML  SOLN
INTRAMUSCULAR | Status: DC | PRN
  Administered 2022-11-27: 7 mL

## 2022-11-27 MED ORDER — DOCUSATE SODIUM 100 MG PO CAPS
100.0000 mg | ORAL_CAPSULE | Freq: Every day | ORAL | 0 refills | Status: DC | PRN
  Filled 2022-11-27: qty 30, 30d supply, fill #0

## 2022-11-27 MED ORDER — DEXAMETHASONE SODIUM PHOSPHATE 4 MG/ML IJ SOLN
INTRAMUSCULAR | Status: DC | PRN
  Administered 2022-11-27: 4 mg via INTRAVENOUS

## 2022-11-27 MED ORDER — EPHEDRINE SULFATE (PRESSORS) 50 MG/ML IJ SOLN
INTRAMUSCULAR | Status: DC | PRN
  Administered 2022-11-27 (×3): 5 mg via INTRAVENOUS

## 2022-11-27 MED ORDER — LACTATED RINGERS IV SOLN: INTRAVENOUS | Status: DC

## 2022-11-27 MED ORDER — FENTANYL CITRATE (PF) 100 MCG/2ML IJ SOLN
INTRAMUSCULAR | Status: AC
  Filled 2022-11-27: qty 2

## 2022-11-27 MED ORDER — BACITRACIN ZINC 500 UNIT/GM EX OINT
TOPICAL_OINTMENT | CUTANEOUS | Status: DC | PRN
  Administered 2022-11-27: 1 via TOPICAL

## 2022-11-27 MED ORDER — BACITRACIN ZINC 500 UNIT/GM EX OINT
TOPICAL_OINTMENT | CUTANEOUS | Status: AC
  Filled 2022-11-27: qty 28.35

## 2022-11-27 MED ORDER — CHLORHEXIDINE GLUCONATE 0.12 % MT SOLN
15.0000 mL | Freq: Once | OROMUCOSAL | Status: AC
  Administered 2022-11-27: 15 mL via OROMUCOSAL

## 2022-11-27 MED ORDER — FENTANYL CITRATE (PF) 100 MCG/2ML IJ SOLN
INTRAMUSCULAR | Status: DC | PRN
  Administered 2022-11-27 (×2): 25 ug via INTRAVENOUS
  Administered 2022-11-27: 50 ug via INTRAVENOUS

## 2022-11-27 MED ORDER — ONDANSETRON HCL 4 MG/2ML IJ SOLN
INTRAMUSCULAR | Status: DC | PRN
  Administered 2022-11-27: 4 mg via INTRAVENOUS

## 2022-11-27 MED ORDER — OXYCODONE-ACETAMINOPHEN 5-325 MG PO TABS: 1.0000 | ORAL_TABLET | ORAL | 0 refills | Status: DC | PRN

## 2022-11-27 MED ORDER — DEXAMETHASONE SODIUM PHOSPHATE 10 MG/ML IJ SOLN
INTRAMUSCULAR | Status: AC
  Filled 2022-11-27: qty 5

## 2022-11-27 MED ORDER — 0.9 % SODIUM CHLORIDE (POUR BTL) OPTIME
TOPICAL | Status: DC | PRN
  Administered 2022-11-27: 1000 mL

## 2022-11-27 MED ORDER — LIDOCAINE HCL (CARDIAC) PF 100 MG/5ML IV SOSY
PREFILLED_SYRINGE | INTRAVENOUS | Status: DC | PRN
  Administered 2022-11-27: 100 mg via INTRAVENOUS

## 2022-11-27 SURGICAL SUPPLY — 33 items
BAG URINE DRAIN 2000ML AR STRL (UROLOGICAL SUPPLIES) IMPLANT
BAG URO CATCHER STRL LF (MISCELLANEOUS) ×2 IMPLANT
BASKET ZERO TIP NITINOL 2.4FR (BASKET) IMPLANT
BENZOIN TINCTURE PRP APPL 2/3 (GAUZE/BANDAGES/DRESSINGS) IMPLANT
BNDG COHESIVE 1X5 TAN STRL LF (GAUZE/BANDAGES/DRESSINGS) IMPLANT
BNDG COHESIVE 2X5 TAN ST LF (GAUZE/BANDAGES/DRESSINGS) IMPLANT
BNDG CONFORM 4 STRL LF (GAUZE/BANDAGES/DRESSINGS) IMPLANT
CATH FOLEY 2WAY SLVR  5CC 16FR (CATHETERS) ×2
CATH FOLEY 2WAY SLVR 5CC 16FR (CATHETERS) IMPLANT
CATH URETERAL DUAL LUMEN 10F (MISCELLANEOUS) IMPLANT
CATH URETL OPEN 5X70 (CATHETERS) ×2 IMPLANT
CLOTH BEACON ORANGE TIMEOUT ST (SAFETY) ×2 IMPLANT
DRSG TEGADERM 2-3/8X2-3/4 SM (GAUZE/BANDAGES/DRESSINGS) IMPLANT
FIBER LASER MOSES 200 DFL (Laser) IMPLANT
GAUZE STRETCH 2X75IN STRL (MISCELLANEOUS) IMPLANT
GAUZE XEROFORM 1X8 LF (GAUZE/BANDAGES/DRESSINGS) IMPLANT
GLOVE BIOGEL M 7.0 STRL (GLOVE) ×2 IMPLANT
GOWN STRL REUS W/ TWL XL LVL3 (GOWN DISPOSABLE) ×2 IMPLANT
GOWN STRL REUS W/TWL XL LVL3 (GOWN DISPOSABLE) ×2
GUIDEWIRE STR DUAL SENSOR (WIRE) ×4 IMPLANT
GUIDEWIRE ZIPWRE .038 STRAIGHT (WIRE) IMPLANT
KIT TURNOVER KIT A (KITS) IMPLANT
LASER FIB FLEXIVA PULSE ID 365 (Laser) IMPLANT
MANIFOLD NEPTUNE II (INSTRUMENTS) ×2 IMPLANT
PACK CYSTO (CUSTOM PROCEDURE TRAY) ×2 IMPLANT
SHEATH NAVIGATOR HD 12/14X46 (SHEATH) IMPLANT
STENT URET 6FRX26 CONTOUR (STENTS) IMPLANT
SUT VIC AB 4-0 PS2 27 (SUTURE) IMPLANT
SYR 10ML LL (SYRINGE) IMPLANT
TRACTIP FLEXIVA PULS ID 200XHI (Laser) IMPLANT
TRACTIP FLEXIVA PULSE ID 200 (Laser) ×2
TUBING CONNECTING 10 (TUBING) ×2 IMPLANT
TUBING UROLOGY SET (TUBING) ×2 IMPLANT

## 2022-11-27 NOTE — Transfer of Care (Signed)
Immediate Anesthesia Transfer of Care Note  Patient: Guy Moore  Procedure(s) Performed: Procedure(s) with comments: CYSTOSCOPY/ RIGHT RETROGRADE PYELOGRAM/RIGHT HOLMIUM LASER/RIGHT STENT PLACEMENT/BASKET (Right) - 60 MINUTES NEEDED FOR CASE PENILE BIOPSY (N/A)  Patient Location: PACU  Anesthesia Type:General  Level of Consciousness:  sedated, patient cooperative and responds to stimulation  Airway & Oxygen Therapy:Patient Spontanous Breathing and Patient connected to face mask oxgen  Post-op Assessment:  Report given to PACU RN and Post -op Vital signs reviewed and stable  Post vital signs:  Reviewed and stable  Last Vitals:  Vitals:   11/27/22 0551 11/27/22 0859  BP: (!) 150/88 (!) 157/91  Pulse: 97 73  Resp: 18 18  Temp: 36.7 C   SpO2: [id number]    Complications: No apparent anesthesia complications

## 2022-11-27 NOTE — Op Note (Addendum)
Operative Note  Preoperative diagnosis:  1.  Right ureteral and renal stone 2. Penile lesion  Postoperative diagnosis: Same  Procedure(s): 1.  Cystoscopy 2. Right ureteroscopy with laser lithotripsy and basket extraction of stones 3. Right retrograde pyelogram 4. Right ureteral stent placement 5. Fluoroscopy with intraoperative interpretation 6. Penile glans incisional biopsy 7. Dorsal penile nerve block  Surgeon: Rexene Alberts, MD  Assistants:  None  Anesthesia:  General  Complications:  None  EBL:  Minimal  Specimens: 1. Stones for stone analysis (to be done at [facility])  Drains/Catheters: 1.  Right 6Fr x 26cm ureteral stent with tether string tied to a foley catheter 2. Foley catheter 69 French  Intraoperative findings:   Cystoscopy demonstrated bilobar obstructing prostate with asymmetric right greater than left obstruction.  Severely trabeculated bladder.  Right ureteral stone protruding through the right ureterovesical junction that was dislodged and evacuated. Right ureteroscopy demonstrated 8 mm right upper pole stone that was fragmented and successfully basket extracted. Successful stent placement. Nonhealing 1.5cm penile glans lesion.  Incisional biopsy over the dorsal aspect with excellent hemostasis and closed with 3 interrupted Vicryl sutures.  Indication:  Guy Moore is a 89 y.o. male with history of obstructing right ureteral stone who presents to the operating today for right ureteroscopy with laser lithotripsy and basket traction of stone.  Following stent placement, he developed urinary retention has indwelling Foley catheter in place.  He has failed a void trial.  He also has a poorly healing penile lesion and presents today for excisional biopsy.  Description of procedure: After informed consent was obtained from the patient, the patient was identified and taken to the operating room and placed in the supine position.  General anesthesia was  administered as well as perioperative IV antibiotics.  At the beginning of the case, a time-out was performed to properly identify the patient, the surgery to be performed, and the surgical site.  Sequential compression devices were applied to the lower extremities at the beginning of the case for DVT prophylaxis.  The patient was then placed in the dorsal lithotomy supine position, prepped and draped in sterile fashion.  We then passed the 21-French rigid cystoscope through the urethra and into the bladder under vision without any difficulty, noting a normal urethra without strictures and a bilobar obstructing prostate.  He had elongated prostatic urethra.  A systematic evaluation of the bladder revealed no evidence of any suspicious bladder lesions.  Ureteral orifices were in normal position.  He is severely trabeculated bladder.  The right ureteral stent was protruding to the right UVJ.  This was dislodged with the scope and then evacuated.  The distal aspect of the ureteral stent was seen protruding from the ureteral orifice.  We then used the alligator-tooth forceps and grasped the distal end of the ureteral stent and brought it out the urethral meatus while watching the proximal coil straighten out nicely on fluoroscopy. Through the ureteral stent, we then passed a 0.038 sensor wire up to the level of the renal pelvis.  The ureteral stent was then removed, leaving the sensor wire up the right ureter.    Under cystoscopic and flouroscopic guidance, we cannulated the ureteral orifice with a 5-French open-ended ureteral catheter and a gentle retrograde pyelogram was performed, revealing a normal caliber ureter without any filling defects. There was no hydronephrosis of the collecting system. A 0.038 sensor wire was then passed up to the level of the renal pelvis and secured to the drape as a  safety wire. The ureteral catheter and cystoscope were removed, leaving the safety wire in place.   A semi-rigid  ureteroscope was passed alongside the wire up the distal ureter which appeared normal. A second 0.038 sensor wire was passed under direct vision and the semirigid scope was removed. A  12/14Fr ureteral access sheath was carefully advanced up the ureter to the level of the UPJ over this wire under fluoroscopic guidance. The flexible ureteroscope was advanced into the collecting system via the access sheath. The collecting system was inspected. The calculus was identified at the right upper pole. Using the 200 micron holmium laser fiber, the stone was fragmented completely. A 2.2 Fr zero tip basket was used to remove the fragments under visual guidance. These were sent for chemical analysis. With the ureteroscope in the kidney, a gentle pyelogram was performed to delineate the calyceal system and we evaluated the calyces systematically. We encountered a no further stone fragments. The rest of the stone fragments were very tiny and these were  irrigated away gently. The calyces were re-inspected and there were no significant stone fragment residual.   We then withdrew the ureteroscope back down the ureter along with the access sheath, noting no evidence of any stones along the course of the ureter.  Prior to removing the ureteroscope, we did pass the Glidewire back up to the ureter to the renal pelvis.   Once the ureteroscope was removed, the Glidewire was backloaded through the rigid cystoscope, which was then advanced down the urethra and into the bladder. We then used the Glidewire under direct vision through the rigid cystoscope and under fluoroscopic guidance and passed up a 6-French, 26 cm double-pigtail ureteral stent up ureter, making sure that the proximal and distal ends coiled within the kidney and bladder respectively.  Note that we left a long tether string attached to the distal end of the ureteral stent and it exited the urethral meatus and this was tied to a 16 [place] Foley catheter.  Foley  catheter was advanced into the bladder.    Next, proceeded with incisional penile glans biopsy of the 1.5c penile lesion.  10 mL lidocaine plain was injected at the base of his penis for dorsal penile nerve block.  I then performed incisional biopsy of the dorsal aspect of his glans and removed a portion of the penile lesion.  The incision was then closed with 3 separate 4-0 interrupted Vicryl sutures.  Excellent hemostasis was obtained.  Bacitracin was placed on the incision.  His foreskin was then repositioned over the glans.  Xeroform, K[place] and loosely wrapped Coban were then gently wrapped around his penis.  The patient tolerated the procedure well and there was no complication. Patient was awoken from anesthesia and taken to the recovery room in stable condition. I was present and scrubbed for the entirety of the case.  Plan:  Patient will be discharged home.  Follow up with me in 7 to 10 days for stent removal along with Foley catheter removal and void trial in the office.  Will also review pathology at that time.  Findings were discussed with his wife and daughter.   Matt R. [facility]  Pager: [phone]

## 2022-11-27 NOTE — H&P (Signed)
Office Visit Report     11/16/2022   --------------------------------------------------------------------------------   Guy Moore  MRN: [id number]  DOB: 10-21-1933, 89 year old Male  SSN:    PRIMARY CARE:  Guy Main A. Sarajane Jews, MD  PRIMARY CARE FAX:  [phone]  REFERRING:  Guy Dalton, NP  PROVIDER:  Rexene Moore, M.D.  SUPERVISING:  Guy Moore, M.D.  TREATING:  Guy Norris, PA-C  LOCATION:  [facility] [phone]     --------------------------------------------------------------------------------   CC/HPI: Guy Moore is an 89 year old male seen as seen today in follow-up with a history of a right ureteral stone s/p right stent placement and urinary retention.   1. Right ureteral stone:  -He has a history of urolithiasis with CT in 07/2021 demonstrating nonobstructing right renal stones measuring 3 mm and 2 mm.  -He developed acute onset right-sided flank pain presented to ED and CT A/P 10/27/2022 revealed a 9 mm x 2 x 3 mm linear stone collection at the distal right ureter with mild right hydronephrosis. He also had an 8 mm right upper pole stone. Of note, few days prior, he was diagnosed with UTI.  -Urine culture 10/22/2022 revealed Enterobacter cloacae a greater than 100,000 and he has been taking ciprofloxacin.  -S/p right ureteral stent placement on 10/29/2022. Unfortunately, postoperatively, he developed urinary retention. Urine culture 11/04/2021 resulted >100K staph aereus and >100K Enterococcus faecalis.  He is currently on Augmentin.  He denies fevers, chills.   #2[name] hematuria: Resolved. Negative evaluation in 06/2020 other than urolithiasis as above. Cystoscopy without suspicious bladder lesions in 06/2020. He does have a smoking history smoking 1 pack/day for 15 years. He quit at age 59. His father had bladder cancer.   3. Urinary retention: He developed urinary tissue postoperatively after right breast stent placement on 10/29/2022. This is  also concomitant with signs of urinary tract infection. Failed void trial on 10/2022.   #4. UTI: Urine culture 11/04/2021 resulted >100K staph aereus and >100K Enterococcus faecalis.  He is currently on Augmentin.  He denies fevers, chills.   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   11/10/2022: Here today for evaluation of foley catheter issue. He was seen yesterday by Guy Moore and failed TOV, catheter was replaced. He is scheduled for right ureteroscopy with Guy. Abner Moore on 2/16. Patient awoke this morning with increasing suprapubic pain and bladder urgency with what he endorses as poor urine output into catheter collection bag.   11/16/2022:  Patient presents with his wife at his side for repeat trial of void. He has completed his Augmentin antibiotic and remains on no medications by urology at this time. Since last seen, patient has been tolerating his catheter well. He does endorse some right-sided meatal discomfort with Foley in place. He otherwise denies leaking from around the catheter, meatal blood or discharge, gross hematuria, suprapubic discomfort, flank pain, fever/chills, nausea/vomiting.     ALLERGIES: NKDA    MEDICATIONS: Omeprazole  Albuterol Sulfate  Amlodipine Besilate  CIPROFLOXACIN Daily  Fluticasone Propionate  Magnesium Gluconate  Methylcellulose  Multivitamin  Potassium Chloride  Pravastatin Sodium  Tylenol  Valsartan     GU PSH: Cystoscopy - 08/15/2020 Cystoscopy Insert Stent, Right - 10/29/2022 Cystoscopy Ureteroscopy - 10/29/2022 Locm 300-399Mg/Ml Iodine,1Ml - 07/22/2020 Vasectomy     NON-GU PSH: Excise Uvula     GU PMH: Inflammatory disorders, penis, Discovered at time of exam under anesthesia with concerns for possible penile neoplasm. He is to have formal biopsy at time  of his scheduled URS with Guy Moore. This was explained in detail to the patient today. - 11/10/2022 Ureteral calculus - 11/10/2022, - 11/09/2022, - 10/29/2022 Urinary Retention - 11/10/2022, -  11/09/2022 Acute Cystitis/UTI - 11/09/2022 Renal calculus - 11/09/2022, - 10/29/2022, - 08/20/2022, - 08/19/2021, - 08/15/2020 Renal cyst - 11/09/2022, - 10/29/2022, - 08/19/2021, - 08/15/2020 Urinary Frequency - 11/05/2022, - 07/17/2020 Balanitis - 08/15/2020 Gross hematuria - 08/15/2020, - 07/22/2020, - 07/17/2020 Dysuria - 07/17/2020    NON-GU PMH: Arrhythmia Arthritis Asthma Atrial Fibrillation GERD Presence of cardiac pacemaker    Immunizations: None   FAMILY HISTORY: 2 sons - Runs in Family 3 daughters - Runs in Family Bladder Cancer - Father Heart Attack - Mother   SOCIAL HISTORY: Marital Status: Married Ethnicity: Not Hispanic Or Latino; Race: White Current Smoking Status: Patient does not smoke anymore. Has not smoked since 07/13/1975.   Tobacco Use Assessment Completed: Used Tobacco in last 30 days? Does not use smokeless tobacco. Drinks 2 drinks per week. Light Drinker.  Does not use drugs. Drinks 2 caffeinated drinks per day.    REVIEW OF SYSTEMS:    GU Review Male:   Patient denies frequent urination, hard to postpone urination, burning/ pain with urination, get up at night to urinate, leakage of urine, stream starts and stops, trouble starting your stream, have to strain to urinate , erection problems, and penile pain.  Gastrointestinal (Upper):   Patient denies nausea, vomiting, and indigestion/ heartburn.  Gastrointestinal (Lower):   Patient denies diarrhea and constipation.  Constitutional:   Patient denies fever, night sweats, weight loss, and fatigue.  Skin:   Patient denies skin rash/ lesion and itching.  Eyes:   Patient denies blurred vision and double vision.  Ears/ Nose/ Throat:   Patient denies sore throat and sinus problems.  Hematologic/Lymphatic:   Patient denies swollen glands and easy bruising.  Cardiovascular:   Patient denies leg swelling and chest pains.  Respiratory:   Patient denies cough and shortness of breath.  Endocrine:   Patient denies excessive  thirst.  Musculoskeletal:   Patient denies back pain and joint pain.  Neurological:   Patient denies headaches and dizziness.  Psychologic:   Patient denies depression and anxiety.   VITAL SIGNS: None   GU PHYSICAL EXAMINATION:    Penis: Penis uncircumcised. Glans penis erythematous lesion continues, with a white exudate compatible with candidiasis. A granular appearance is not seen today.   Notes: Penile Foley catheter in place draining clear, yellow urine.   MULTI-SYSTEM PHYSICAL EXAMINATION:    Constitutional: Well-nourished. No physical deformities. Normally developed. Good grooming.  Respiratory: No labored breathing, no use of accessory muscles.   Cardiovascular: Normal temperature, normal extremity pulses, no swelling.   Skin: No paleness, no jaundice, no cyanosis.  Neurologic / Psychiatric: Oriented to time, oriented to place, oriented to person. No depression, no anxiety, no agitation.  Gastrointestinal: Obese abdomen. No mass, no suprapubic or bilateral CVA tenderness, no rigidity.      Complexity of Data:  Source Of History:  Patient, Family/Caregiver, Medical Record Summary  Records Review:   Previous Doctor Records, Previous Patient Records   PROCEDURES:         Voiding Trial - 51700  Voided Volume: 40 cc  Instilled Volume: 160 cc        Simple Foley Catheterization - [id number]  A 16 French Foley catheter was inserted into the bladder using sterile technique. The patient was taught routine catheter care. A bedside bag was connected. A urinalysis  was sent to the lab. A urine culture was sent to the lab.         Urinalysis w/Scope Dipstick Dipstick Cont'd Micro  Color: Yellow Bilirubin: Neg mg/dL WBC/hpf: 0 - 5/hpf  Appearance: Slightly Cloudy Ketones: Neg mg/dL RBC/hpf: 0 - 2/hpf  Specific Gravity: 1.025 Blood: 1+ ery/uL Bacteria: Rare (0-9/hpf)  pH: <=5.0 Protein: 1+ mg/dL Cystals: Uric Acid  Glucose: Neg mg/dL Urobilinogen: 0.2 mg/dL Casts: NS (Not Seen)     Nitrites: Neg Trichomonas: Not Present    Leukocyte Esterase: 1+ leu/uL Mucous: Not Present      Epithelial Cells: 0 - 5/hpf      Yeast: NS (Not Seen)      Sperm: Not Present    Notes: Micro performed on unspun urine due to QNS    ASSESSMENT:      ICD-10 Details  1 GU:   Urinary Retention - XX123456 Acute, Uncomplicated  2   Inflammatory disorders, penis - 99991111 Acute, Uncomplicated   PLAN:            Medications New Meds: Nystatin 100,000 unit/gram ointment 1 gram Topical BID   #35  1 Refill(s)  Pharmacy Name:  [facility] [id number]  Address:  [address]   [place], [place] [id number]  Phone:  [phone]  Fax:  [phone]            Orders Labs Urine Culture CATH, Urinalysis          Schedule Return Visit/Planned Activity: Keep Scheduled Appointment - Schedule Surgery          Document Letter(s):  Created for Patient: Clinical Summary         Notes:   Unsuccessful trial of void today. 86 French Foley catheter replaced. A urinalysis and urine culture were submitted. We will follow-up with patient as appropriate based on C&S report.   With regards to his lesion on the glans penis, we will prescribe nystatin to rule out balanitis ahead of schedule surgery.   We will have patient maintain his surgical appointment, keeping his Foley catheter until that time. We advised return to clinic with development of concerning irritative, obstructive, or constitutional symptomologies.        Next Appointment:      Next Appointment: 11/27/2022 07:30 AM    Appointment Type: Surgery     Location: [facility] [phone]    Provider: Rexene Moore, M.D.    Reason for Visit: [facility]/OP CYSTO, (R)URS/HLL, (R) RPG, (R) STENT, PENILE BIOPSY    Urology Preoperative H&P   Chief Complaint: Right renal, right ureteral stone, penile lesion  History of Present Illness: Guy Moore is a 89 y.o. male with a right renal and right ureteral stone with penile  lesion here for right URS/LL and penile biopsy. He has applied topical antifungal to glans with no improvement in lesions. He would like to proceed with penile biopsy as well as treat his stones. He has foley catheter in place as he failed a void trial in office and foley will be replaced at the end of the case. He will likely require UDS and TRUS outpatient if fails again.    Past Medical History:  Diagnosis Date   Allergic rhinitis    Arthritis    Asthma    Atrial fibrillation [facility])    sees Guy. [name] at [facility]    Atrial flutter ([place])    BCC (basal cell carcinoma of skin)  Nose   Chronic kidney disease    Complication of anesthesia    COPD with emphysema ([place])    sees Guy. [name]    Depression    Diabetes mellitus ([place])    Diet control , hyperglycemic but not diabetic per Pt 10/2022   Diverticulosis    Erosive esophagitis    Esophageal stricture    Gastric ulcer    Hearing loss    uses amplification   Hemorrhoids    Hiatal hernia    HTN (hypertension)    Hyperlipidemia    Knee problem    2% permanent partial impairment Right   Macular degeneration    Perineal abscess    Pneumonia    Sleep apnea     Past Surgical History:  Procedure Laterality Date   CARDIAC ELECTROPHYSIOLOGY STUDY AND ABLATION     x 4 (sees Guy. [name] at [facility])   [place] Right 10/29/2022   Procedure: CYSTOSCOPY WITH RETROGRADE WITH URETHERAL [place] DIAGNOSTIC  URETEROSCOPY AND STENT PLACEMENT;  Surgeon: Janith Lima, MD;  Location: [facility];  Service: Urology;  Laterality: Right;   INSERT / REPLACE / REMOVE PACEMAKER     NOSE SURGERY     septoplasty   UVULOPALATOPHARYNGOPLASTY  06/13/1979   VASECTOMY  [id number]   w/complications    Allergies: No Known Allergies  Family History  Problem Relation Age of Onset   Stroke Mother    Breast cancer Mother    Heart attack  Mother    Other Mother        brain tunor   Heart disease Father    Hypertension Father    Bladder Cancer Father    Parkinson's disease Father    Stroke Father    Breast cancer Brother    Atrial fibrillation Brother    Tremor Brother    Stroke Brother    Hypertension Brother    Breast cancer Maternal Grandmother    Heart disease Maternal Grandmother    Heart disease Maternal Grandfather    Arthritis Daughter    Heart disease Daughter    Colitis Daughter        Lymphocytic   Colon cancer Neg Hx     Social History:  reports that he quit smoking about 45 years ago. His smoking use included cigarettes. He has a 28.00 pack-year smoking history. He has never used smokeless tobacco. He reports that he does not currently use alcohol. He reports that he does not use drugs.  ROS: A complete review of systems was performed.  All systems are negative except for pertinent findings as noted.  Physical Exam:  Vital signs in last 24 hours: Temp:  [98.1 F (36.7 C)] 98.1 F (36.7 C) (02/16 0551) Pulse Rate:  [97] 97 (02/16 0551) Resp:  [18] 18 (02/16 0551) BP: (150)/(88) 150/88 (02/16 0551) SpO2:  [98 %] 98 % (02/16 0551) Weight:  [105.2 kg] 105.2 kg (02/16 [id number]) Constitutional:  Alert and oriented, No acute distress Cardiovascular: Regular rate and rhythm Respiratory: Normal respiratory effort, Lungs clear bilaterally GI: Abdomen is soft, nontender, nondistended, no abdominal masses GU: No CVA tenderness Lymphatic: No lymphadenopathy Neurologic: Grossly intact, no focal deficits Psychiatric: Normal mood and affect  Laboratory Data:  No results for input(s): "WBC", "HGB", "HCT", "PLT" in the last 72 hours.  No results for input(s): "NA", "K", "CL", "GLUCOSE", "BUN", "CALCIUM", "CREATININE" in the last 72 hours.  Invalid input(s): "CO3"  No results found for this or any previous visit (from the past 24 hour(s)). No results found for this or any previous visit (from the past 240  hour(s)).  Renal Function: No results for input(s): "CREATININE" in the last 168 hours. CrCl cannot be calculated (Patient's most recent lab result is older than the maximum 21 days allowed.).  Radiologic Imaging: No results found.  I independently reviewed the above imaging studies.  Assessment and Plan Guy Moore is a 89 y.o. male with a right ureteral and right renal stone and penile lesion here for R URS/LL, penile biopsy.  -The risks, benefits and alternatives of cystoscopy with right URS/LL, right JJ stent placement was discussed with the patient.  Risks include, but are not limited to: bleeding, urinary tract infection, ureteral injury, ureteral stricture disease, chronic pain, urinary symptoms, bladder injury, stent migration, the need for nephrostomy tube placement, MI, CVA, DVT, PE and the inherent risks with general anesthesia.  The patient voices understanding and wishes to proceed.       Matt R. Maan Zarcone MD 11/27/2022, 7:15 AM  [facility] Pager: [phone]): [phone]

## 2022-11-27 NOTE — Anesthesia Procedure Notes (Signed)
Procedure Name: LMA Insertion Date/Time: 11/27/2022 7:31 AM  Performed by: Lavina Hamman, CRNAPre-anesthesia Checklist: Patient identified, Emergency Drugs available, Suction available, Patient being monitored and Timeout performed Patient Re-evaluated:Patient Re-evaluated prior to induction Oxygen Delivery Method: Circle system utilized Preoxygenation: Pre-oxygenation with 100% oxygen Induction Type: IV induction Ventilation: Mask ventilation without difficulty LMA: LMA inserted LMA Size: 4.0 Tube size: 4.0 mm Number of attempts: 1 Placement Confirmation: positive ETCO2 and breath sounds checked- equal and bilateral Tube secured with: Tape Dental Injury: Teeth and Oropharynx as per pre-operative assessment

## 2022-11-27 NOTE — Anesthesia Preprocedure Evaluation (Signed)
Anesthesia Evaluation  Patient identified by MRN, date of birth, ID band Patient awake    Reviewed: Allergy & Precautions, H&P , NPO status , Patient's Chart, lab work & pertinent test results  Airway Mallampati: II  TM Distance: >3 FB Neck ROM: Full    Dental no notable dental hx.    Pulmonary shortness of breath, asthma , sleep apnea , COPD, former smoker   Pulmonary exam normal breath sounds clear to auscultation       Cardiovascular hypertension, Normal cardiovascular exam+ dysrhythmias Atrial Fibrillation + pacemaker  Rhythm:Regular Rate:Normal     Neuro/Psych negative neurological ROS  negative psych ROS   GI/Hepatic Neg liver ROS, hiatal hernia,GERD  ,,  Endo/Other  negative endocrine ROSdiabetes    Renal/GU negative Renal ROS  negative genitourinary   Musculoskeletal negative musculoskeletal ROS (+)    Abdominal   Peds negative pediatric ROS (+)  Hematology negative hematology ROS (+)   Anesthesia Other Findings   Reproductive/Obstetrics negative OB ROS                             Anesthesia Physical Anesthesia Plan  ASA: 3  Anesthesia Plan: General   Post-op Pain Management: Minimal or no pain anticipated   Induction: Intravenous  PONV Risk Score and Plan: 2 and Ondansetron, Dexamethasone and Treatment may vary due to age or medical condition  Airway Management Planned: LMA  Additional Equipment:   Intra-op Plan:   Post-operative Plan: Extubation in OR  Informed Consent: I have reviewed the patients History and Physical, chart, labs and discussed the procedure including the risks, benefits and alternatives for the proposed anesthesia with the patient or authorized representative who has indicated his/her understanding and acceptance.     Dental advisory given  Plan Discussed with: CRNA and Surgeon  Anesthesia Plan Comments:        Anesthesia Quick  Evaluation

## 2022-11-27 NOTE — Discharge Instructions (Signed)
[facility] [phone] Post Ureteroscopy With or Without Stent Instructions  Definitions:  Ureter: The duct that transports urine from the kidney to the bladder. Stent:   A plastic hollow tube that is placed into the ureter, from the kidney to the bladder to prevent the ureter from swelling shut.  GENERAL INSTRUCTIONS:  Despite the fact that no skin incisions were used, the area around the ureter and bladder is raw and irritated. The stent is a foreign body which will further irritate the bladder wall. This irritation is manifested by increased frequency of urination, both day and night, and by an increase in the urge to urinate. In some, the urge to urinate is present almost always. Sometimes the urge is strong enough that you may not be able to stop yourself from urinating. The only real cure is to remove the stent and then give time for the bladder wall to heal which can't be done until the danger of the ureter swelling shut has passed, which varies.  You may see some blood in your urine while the stent is in place and a few days afterwards. Do not be alarmed, even if the urine was clear for a while. Get off your feet and drink lots of fluids until clearing occurs. If you start to pass clots or don't improve, call us.  DIET: You may return to your normal diet immediately. Because of the raw surface of your bladder, alcohol, spicy foods, acid type foods and drinks with caffeine may cause irritation or frequency and should be used in moderation. To keep your urine flowing freely and to avoid constipation, drink plenty of fluids during the day ( 8-10 glasses ). Tip: Avoid cranberry juice because it is very acidic.  ACTIVITY: Your physical activity doesn't need to be restricted. However, if you are very active, you may see some blood in your urine. We suggest that you reduce your activity under these circumstances until the bleeding has stopped.  BOWELS: It is important to  keep your bowels regular during the postoperative period. Straining with bowel movements can cause bleeding. A bowel movement every other day is reasonable. Use a mild laxative if needed, such as Milk of Magnesia 2-3 tablespoons, or 2 Dulcolax tablets. Call if you continue to have problems. If you have been taking narcotics for pain, before, during or after your surgery, you may be constipated. Take a laxative if necessary.   MEDICATION: You should resume your pre-surgery medications unless told not to. In addition you will often be given an antibiotic to prevent infection. These should be taken as prescribed until the bottles are finished unless you are having an unusual reaction to one of the drugs.  PROBLEMS YOU SHOULD REPORT TO [place]: Fevers over 100.5 Fahrenheit. Heavy bleeding, or clots ( See above notes about blood in urine ). Inability to urinate. Drug reactions ( hives, rash, nausea, vomiting, diarrhea ). Severe burning or pain with urination that is not improving.  FOLLOW-UP: You will need a follow-up appointment to monitor your progress. Call for this appointment at the number listed above. Usually the first appointment will be about three to fourteen days after your surgery.  You have a right ureteral stent draining your bladder.  This is attached to Foley catheter.  This will be removed in the office next week.

## 2022-11-27 NOTE — Anesthesia Postprocedure Evaluation (Signed)
Anesthesia Post Note  Patient: Guy Moore  Procedure(s) Performed: CYSTOSCOPY/ RIGHT RETROGRADE PYELOGRAM/RIGHT HOLMIUM LASER/RIGHT STENT PLACEMENT/BASKET (Right) PENILE BIOPSY     Patient location during evaluation: PACU Anesthesia Type: General Level of consciousness: awake and alert Pain management: pain level controlled Vital Signs Assessment: post-procedure vital signs reviewed and stable Respiratory status: spontaneous breathing, nonlabored ventilation, respiratory function stable and patient connected to nasal cannula oxygen Cardiovascular status: blood pressure returned to baseline and stable Postop Assessment: no apparent nausea or vomiting Anesthetic complications: no  No notable events documented.  Last Vitals:  Vitals:   11/27/22 0915 11/27/22 0930  BP: (!) 153/87 (!) 155/89  Pulse: 73 79  Resp: 18 13  Temp:  (!) 36.4 C  SpO2: 100% 97%    Last Pain:  Vitals:   11/27/22 0930  TempSrc:   PainSc: 0-No pain                 Eathen Budreau S

## 2022-11-28 ENCOUNTER — Encounter (HOSPITAL_COMMUNITY): Payer: Self-pay | Admitting: Urology

## 2022-11-30 ENCOUNTER — Other Ambulatory Visit: Payer: Self-pay

## 2022-11-30 ENCOUNTER — Telehealth: Payer: Self-pay | Admitting: Family Medicine

## 2022-11-30 LAB — SURGICAL PATHOLOGY

## 2022-11-30 MED ORDER — CYANOCOBALAMIN 1000 MCG/ML IJ SOLN: 1000.0000 ug | INTRAMUSCULAR | 0 refills | Status: AC

## 2022-11-30 NOTE — Telephone Encounter (Signed)
Please renew the B12 orders for another year

## 2022-11-30 NOTE — Telephone Encounter (Signed)
Pt Vit B12 order renewed for another year per Dr Sarajane Jews

## 2022-11-30 NOTE — Telephone Encounter (Signed)
Pt requested a B-12 shot appointment.   Pt was scheduled for Friday, 12/04/22 for a B-12 shot, however;  I do not see an active request for B-12.  Please advise.

## 2022-11-30 NOTE — Telephone Encounter (Signed)
Last B12 injection-10/01/22

## 2022-12-03 DIAGNOSIS — N401 Enlarged prostate with lower urinary tract symptoms: Secondary | ICD-10-CM | POA: Diagnosis not present

## 2022-12-03 DIAGNOSIS — N200 Calculus of kidney: Secondary | ICD-10-CM | POA: Diagnosis not present

## 2022-12-03 DIAGNOSIS — R338 Other retention of urine: Secondary | ICD-10-CM | POA: Diagnosis not present

## 2022-12-03 DIAGNOSIS — N281 Cyst of kidney, acquired: Secondary | ICD-10-CM | POA: Diagnosis not present

## 2022-12-03 DIAGNOSIS — D074 Carcinoma in situ of penis: Secondary | ICD-10-CM | POA: Diagnosis not present

## 2022-12-04 ENCOUNTER — Ambulatory Visit (INDEPENDENT_AMBULATORY_CARE_PROVIDER_SITE_OTHER): Payer: Medicare Other

## 2022-12-04 DIAGNOSIS — E538 Deficiency of other specified B group vitamins: Secondary | ICD-10-CM | POA: Diagnosis not present

## 2022-12-04 MED ORDER — CYANOCOBALAMIN 1000 MCG/ML IJ SOLN
1000.0000 ug | Freq: Once | INTRAMUSCULAR | Status: AC
  Administered 2022-12-04: 1000 ug via INTRAMUSCULAR

## 2022-12-04 NOTE — Progress Notes (Signed)
Pt here for monthly B12 injection per Dr Sarajane Jews  B12 1061mg given IM, and pt tolerated injection well.  Next B12 injection scheduled for 01/01/23

## 2022-12-21 ENCOUNTER — Encounter (INDEPENDENT_AMBULATORY_CARE_PROVIDER_SITE_OTHER): Payer: Medicare Other | Admitting: Ophthalmology

## 2022-12-21 DIAGNOSIS — H35033 Hypertensive retinopathy, bilateral: Secondary | ICD-10-CM | POA: Diagnosis not present

## 2022-12-21 DIAGNOSIS — I10 Essential (primary) hypertension: Secondary | ICD-10-CM | POA: Diagnosis not present

## 2022-12-21 DIAGNOSIS — D3131 Benign neoplasm of right choroid: Secondary | ICD-10-CM

## 2022-12-21 DIAGNOSIS — H353231 Exudative age-related macular degeneration, bilateral, with active choroidal neovascularization: Secondary | ICD-10-CM | POA: Diagnosis not present

## 2022-12-21 DIAGNOSIS — H43813 Vitreous degeneration, bilateral: Secondary | ICD-10-CM | POA: Diagnosis not present

## 2022-12-22 DIAGNOSIS — N401 Enlarged prostate with lower urinary tract symptoms: Secondary | ICD-10-CM | POA: Diagnosis not present

## 2022-12-22 DIAGNOSIS — N200 Calculus of kidney: Secondary | ICD-10-CM | POA: Diagnosis not present

## 2022-12-22 DIAGNOSIS — D074 Carcinoma in situ of penis: Secondary | ICD-10-CM | POA: Diagnosis not present

## 2022-12-22 DIAGNOSIS — N281 Cyst of kidney, acquired: Secondary | ICD-10-CM | POA: Diagnosis not present

## 2022-12-22 DIAGNOSIS — R350 Frequency of micturition: Secondary | ICD-10-CM | POA: Diagnosis not present

## 2022-12-23 ENCOUNTER — Other Ambulatory Visit: Payer: Self-pay | Admitting: Family Medicine

## 2022-12-28 DIAGNOSIS — R338 Other retention of urine: Secondary | ICD-10-CM | POA: Diagnosis not present

## 2022-12-29 ENCOUNTER — Other Ambulatory Visit: Payer: Self-pay

## 2022-12-29 DIAGNOSIS — E785 Hyperlipidemia, unspecified: Secondary | ICD-10-CM

## 2022-12-29 DIAGNOSIS — I10 Essential (primary) hypertension: Secondary | ICD-10-CM

## 2022-12-30 ENCOUNTER — Other Ambulatory Visit: Payer: Self-pay | Admitting: Urology

## 2022-12-31 NOTE — Progress Notes (Signed)
Remote pacemaker transmission.   

## 2023-01-01 ENCOUNTER — Ambulatory Visit (INDEPENDENT_AMBULATORY_CARE_PROVIDER_SITE_OTHER): Payer: Medicare Other

## 2023-01-01 DIAGNOSIS — E538 Deficiency of other specified B group vitamins: Secondary | ICD-10-CM

## 2023-01-01 MED ORDER — CYANOCOBALAMIN 1000 MCG/ML IJ SOLN
1000.0000 ug | Freq: Once | INTRAMUSCULAR | Status: AC
  Administered 2023-01-01: 1000 ug via INTRAMUSCULAR

## 2023-01-01 NOTE — Progress Notes (Signed)
Pt here for monthly B12 injection per Dr Sarajane Jews  B12 1065mcg given IM, and pt tolerated injection well.  Next B12 injection scheduled for 02/01/23

## 2023-01-11 ENCOUNTER — Telehealth: Payer: Self-pay | Admitting: Family Medicine

## 2023-01-11 NOTE — Telephone Encounter (Signed)
Contacted Julien Girt to schedule their annual wellness visit. Appointment made for 01/18/23.  Barkley Boards AWV direct phone # [phone]

## 2023-01-18 ENCOUNTER — Ambulatory Visit (INDEPENDENT_AMBULATORY_CARE_PROVIDER_SITE_OTHER): Payer: Medicare Other

## 2023-01-18 VITALS — Ht 70.0 in | Wt 233.0 lb

## 2023-01-18 DIAGNOSIS — H6123 Impacted cerumen, bilateral: Secondary | ICD-10-CM | POA: Diagnosis not present

## 2023-01-18 DIAGNOSIS — Z0000 Encounter for general adult medical examination without abnormal findings: Secondary | ICD-10-CM

## 2023-01-18 NOTE — Patient Instructions (Addendum)
[name] , Thank you for taking time to come for your Medicare Wellness Visit. I appreciate your ongoing commitment to your health goals. Please review the following plan we discussed and let me know if I can assist you in the future.   These are the goals we discussed:  Goals       Exercise 3x per week (30 min per time) (pt-stated)      Will continue to increase physical activity.      Patient Stated (pt-stated)      I would like to stay alive.        This is a list of the screening recommended for you and due dates:  Health Maintenance  Topic Date Due   Complete foot exam   06/28/2015   DTaP/Tdap/Td vaccine (2 - Tdap) 10/10/2015   Eye exam for diabetics  01/18/2023*   COVID-19 Vaccine (7 - 2023-24 season) 02/03/2023*   Zoster (Shingles) Vaccine (1 of 2) 04/19/2023*   Hemoglobin A1C  05/11/2023   Flu Shot  05/13/2023   Medicare Annual Wellness Visit  01/18/2024   Pneumonia Vaccine  Completed   HPV Vaccine  Aged Out  *Topic was postponed. The date shown is not the original due date.   Opioid Pain Medicine Management Opioid pain medicines are strong medicines that are used to treat bad or very bad pain. When you take them for a short time, they can help you: Sleep better. Do better in physical therapy. Feel better during the first few days after you get hurt. Recover from surgery. Only take these medicines if a doctor says that you can. You should only take them for a short time. This is because opioids can be very addictive. This means that they are hard to stop taking. The longer you take opioids, the harder it may be to stop taking them. What are the risks? Opioids can cause problems (side effects). Taking them for more than 3 days raises your chance of problems, such as: Trouble pooping (constipation). Feeling sick to your stomach (nausea). Vomiting. Feeling very sleepy. Confusion. Not being able to stop taking the medicine. Breathing problems. Taking opioids for a  long time can make it hard for you to do daily tasks. It can also put you at risk for: Car accidents. Depression. Suicide. Heart attack. Taking too much of the medicine (overdose). This can lead to death. What is a pain treatment plan? A pain treatment plan is a plan made by you and your doctor. Work with your doctor to make a plan for treating your pain. To help you do this: Talk about the goals of your treatment, including: How much pain you might expect to have. How you will manage the pain. Talk about the risks and benefits of taking these medicines for your condition. Remember that a good treatment plan uses more than one approach and lowers the risks of side effects. Tell your doctor about the amount of medicines you take and about any drug or alcohol use. Get your pain medicine prescriptions from only one doctor. Pain can be managed with other treatments. Work with your doctor to find other ways to help your pain, such as: Physical therapy or doing gentle exercises. Counseling. Eating healthy foods. Massage. Meditation. Other pain medicines. How to use opioid pain medicine safely Taking medicine Take your pain medicine exactly as told by your doctor. Take it only when you need it. If your pain is not too bad, you may take less medicine if  your doctor allows. If you have no pain, do not take the medicine unless your doctor tells you to take it. If your pain is very bad, do not take more medicine than your doctor told you to take. Call your doctor to know what to do. Write down the times when you take your pain medicine. Look at the times before you take your next dose. Take other over-the-counter or prescription medicines only as told by your doctor. Keeping yourself and others safe  While you are taking opioids: Do not drive, use machines, or power tools. Do not sign important papers (legal documents). Do not drink alcohol. Do not take sleeping pills. Do not take care of  children by yourself. Do not do activities where you need to climb or be in high places, like working on a ladder. Do not go to a lake, river, ocean, swimming pool, or hot tub. Keep your opioids locked up or in a place where children cannot reach them. Do not share your pain medicine with anyone. Stopping your use of opioids If you have been taking opioids for more than a few weeks, you may need to slowly decrease (taper) how much you take until you stop taking them. Doing this can lower your chance of having symptoms.  Symptoms that come from suddenly stopping the use of opioids include: Pain and cramping in your belly (abdomen). Feeling sick to your stomach (nausea).z Sweating. Feeling very sleepy. Feeling restless. Shaking you cannot control (tremors). Cravings for the medicine. Do not try to stop taking them by yourself. Work with your doctor to stop. Your doctor will help you take less until you are not taking the medicine at all. Getting rid of unused pills Do not save any pills that you did not use. Get rid of the pills by: Taking them to a take-back program in your area. Bringing them to a pharmacy that receives unused pills. Flushing them down the toilet. Check the label or package insert of your medicine to see whether this is safe to do. Throwing them in the trash. Check the label or package insert of your medicine to see whether this is safe to do. If it is safe to throw them out: Take the pills out of their container. Put the pills into a container you can seal. Mix the pills with used coffee grounds, food scraps, dirt, or cat litter. Put this in the trash. Follow these instructions at home: Activity Do exercises as told by your doctor. Avoid doing things that make your pain worse. Return to your normal activities as told by your doctor. Ask your doctor what activities are safe for you. General instructions You may need to take these actions to prevent or treat  constipation: Drink enough fluid to keep your pee (urine) pale yellow. Take over-the-counter or prescription medicines. Eat foods that are high in fiber. These include beans, whole grains, and fresh fruits and vegetables. Limit foods that are high in fat and sugar. These include fried or sweet foods. Keep all follow-up visits. Where to find support If you have been taking opioids for a long time, get help from a local support group or counselor. Ask your doctor about this. Where to find more information Centers for Disease Control and Prevention (CDC): FootballExhibition.com.br [place] Food and Drug Administration (FDA): PumpkinSearch.com.ee Get help right away if: You may have taken too much of an opioid (overdosed). Common symptoms of an overdose: Your breathing is slower or more shallow than normal. You have  a very slow heartbeat. Your speech is not normal. You vomit or you feel as if you may vomit. The black centers of your eyes (pupils) are smaller than normal. You have other potential symptoms: You feel very confused. You faint. You are very sleepy. You have cold skin. You have blue lips or fingernails. You have thoughts of harming yourself or harming others. These symptoms may be an emergency. Get help right away. Call your local emergency services (911 in the [place] Do not wait to see if the symptoms will go away. Do not drive yourself to the hospital. Get help right away if you feel like you may hurt yourself or others, or have thoughts about taking your own life. Go to your nearest emergency room or: Call your local emergency services (911 in the [place] Call the [facility] at 732-359-1947. Call a suicide crisis helpline, such as the National Suicide Prevention Lifeline at 705-032-4104 or 988 in the [place] This is open 24 hours a day. Text the Crisis Text Line at 909 424 2525. Summary Opioid are strong medicines that are used to treat bad or very bad pain. A pain treatment plan is a  plan made by you and your doctor. Work with your doctor to make a plan for treating your pain. If you think that you or someone else may have taken too much of an opioid, get help right away. This information is not intended to replace advice given to you by your health care provider. Make sure you discuss any questions you have with your health care provider. Document Revised: 04/23/2021 Document Reviewed: 01/08/2021 Elsevier Patient Education  2023 [employer].  Advanced directives: Please bring a copy of your health care power of attorney and living will to the office to be added to your chart at your convenience.   Conditions/risks identified: None  Next appointment: Follow up in one year for your annual wellness visit.   Preventive Care 6 Years and Older, Male  Preventive care refers to lifestyle choices and visits with your health care provider that can promote health and wellness. What does preventive care include? A yearly physical exam. This is also called an annual well check. Dental exams once or twice a year. Routine eye exams. Ask your health care provider how often you should have your eyes checked. Personal lifestyle choices, including: Daily care of your teeth and gums. Regular physical activity. Eating a healthy diet. Avoiding tobacco and drug use. Limiting alcohol use. Practicing safe sex. Taking low doses of aspirin every day. Taking vitamin and mineral supplements as recommended by your health care provider. What happens during an annual well check? The services and screenings done by your health care provider during your annual well check will depend on your age, overall health, lifestyle risk factors, and family history of disease. Counseling  Your health care provider may ask you questions about your: Alcohol use. Tobacco use. Drug use. Emotional well-being. Home and relationship well-being. Sexual activity. Eating habits. History of falls. Memory and  ability to understand (cognition). Work and work Astronomer. Screening  You may have the following tests or measurements: Height, weight, and BMI. Blood pressure. Lipid and cholesterol levels. These may be checked every 5 years, or more frequently if you are over 9 years old. Skin check. Lung cancer screening. You may have this screening every year starting at age 35 if you have a 30-pack-year history of smoking and currently smoke or have quit within the past 15 years. Fecal occult  blood test (FOBT) of the stool. You may have this test every year starting at age 19. Flexible sigmoidoscopy or colonoscopy. You may have a sigmoidoscopy every 5 years or a colonoscopy every 10 years starting at age 38. Prostate cancer screening. Recommendations will vary depending on your family history and other risks. Hepatitis C blood test. Hepatitis B blood test. Sexually transmitted disease (STD) testing. Diabetes screening. This is done by checking your blood sugar (glucose) after you have not eaten for a while (fasting). You may have this done every 1-3 years. Abdominal aortic aneurysm (AAA) screening. You may need this if you are a current or former smoker. Osteoporosis. You may be screened starting at age 46 if you are at high risk. Talk with your health care provider about your test results, treatment options, and if necessary, the need for more tests. Vaccines  Your health care provider may recommend certain vaccines, such as: Influenza vaccine. This is recommended every year. Tetanus, diphtheria, and acellular pertussis (Tdap, Td) vaccine. You may need a Td booster every 10 years. Zoster vaccine. You may need this after age 73. Pneumococcal 13-valent conjugate (PCV13) vaccine. One dose is recommended after age 9. Pneumococcal polysaccharide (PPSV23) vaccine. One dose is recommended after age 49. Talk to your health care provider about which screenings and vaccines you need and how often you need  them. This information is not intended to replace advice given to you by your health care provider. Make sure you discuss any questions you have with your health care provider. Document Released: 10/25/2015 Document Revised: 06/17/2016 Document Reviewed: 07/30/2015 Elsevier Interactive Patient Education  2017 [employer].  Fall Prevention in the Home Falls can cause injuries. They can happen to people of all ages. There are many things you can do to make your home safe and to help prevent falls. What can I do on the outside of my home? Regularly fix the edges of walkways and driveways and fix any cracks. Remove anything that might make you trip as you walk through a door, such as a raised step or threshold. Trim any bushes or trees on the path to your home. Use bright outdoor lighting. Clear any walking paths of anything that might make someone trip, such as rocks or tools. Regularly check to see if handrails are loose or broken. Make sure that both sides of any steps have handrails. Any raised decks and porches should have guardrails on the edges. Have any leaves, snow, or ice cleared regularly. Use sand or salt on walking paths during winter. Clean up any spills in your garage right away. This includes oil or grease spills. What can I do in the bathroom? Use night lights. Install grab bars by the toilet and in the tub and shower. Do not use towel bars as grab bars. Use non-skid mats or decals in the tub or shower. If you need to sit down in the shower, use a plastic, non-slip stool. Keep the floor dry. Clean up any water that spills on the floor as soon as it happens. Remove soap buildup in the tub or shower regularly. Attach bath mats securely with double-sided non-slip rug tape. Do not have throw rugs and other things on the floor that can make you trip. What can I do in the bedroom? Use night lights. Make sure that you have a light by your bed that is easy to reach. Do not use  any sheets or blankets that are too big for your bed. They should not  hang down onto the floor. Have a firm chair that has side arms. You can use this for support while you get dressed. Do not have throw rugs and other things on the floor that can make you trip. What can I do in the kitchen? Clean up any spills right away. Avoid walking on wet floors. Keep items that you use a lot in easy-to-reach places. If you need to reach something above you, use a strong step stool that has a grab bar. Keep electrical cords out of the way. Do not use floor polish or wax that makes floors slippery. If you must use wax, use non-skid floor wax. Do not have throw rugs and other things on the floor that can make you trip. What can I do with my stairs? Do not leave any items on the stairs. Make sure that there are handrails on both sides of the stairs and use them. Fix handrails that are broken or loose. Make sure that handrails are as long as the stairways. Check any carpeting to make sure that it is firmly attached to the stairs. Fix any carpet that is loose or worn. Avoid having throw rugs at the top or bottom of the stairs. If you do have throw rugs, attach them to the floor with carpet tape. Make sure that you have a light switch at the top of the stairs and the bottom of the stairs. If you do not have them, ask someone to add them for you. What else can I do to help prevent falls? Wear shoes that: Do not have high heels. Have rubber bottoms. Are comfortable and fit you well. Are closed at the toe. Do not wear sandals. If you use a stepladder: Make sure that it is fully opened. Do not climb a closed stepladder. Make sure that both sides of the stepladder are locked into place. Ask someone to hold it for you, if possible. Clearly mark and make sure that you can see: Any grab bars or handrails. First and last steps. Where the edge of each step is. Use tools that help you move around (mobility aids)  if they are needed. These include: Canes. Walkers. Scooters. Crutches. Turn on the lights when you go into a dark area. Replace any light bulbs as soon as they burn out. Set up your furniture so you have a clear path. Avoid moving your furniture around. If any of your floors are uneven, fix them. If there are any pets around you, be aware of where they are. Review your medicines with your doctor. Some medicines can make you feel dizzy. This can increase your chance of falling. Ask your doctor what other things that you can do to help prevent falls. This information is not intended to replace advice given to you by your health care provider. Make sure you discuss any questions you have with your health care provider. Document Released: 07/25/2009 Document Revised: 03/05/2016 Document Reviewed: 11/02/2014 Elsevier Interactive Patient Education  2017 [employer].

## 2023-01-18 NOTE — Progress Notes (Signed)
Subjective:   Guy Moore is a 89 y.o. male who presents for Medicare Annual/Subsequent preventive examination.  Review of Systems    Virtual Visit via Telephone Note  I connected with  Guy Moore on 01/18/23 at 11:30 AM EDT by telephone and verified that I am speaking with the correct person using two identifiers.  Location: Patient: Home Provider: Office Persons participating in the virtual visit: patient/Nurse Health Advisor   I discussed the limitations, risks, security and privacy concerns of performing an evaluation and management service by telephone and the availability of in person appointments. The patient expressed understanding and agreed to proceed.  Interactive audio and video telecommunications were attempted between this nurse and patient, however failed, due to patient having technical difficulties OR patient did not have access to video capability.  We continued and completed visit with audio only.  Some vital signs may be absent or patient reported.   Tillie RungBeverly W Gazella Anglin, LPN  Cardiac Risk Factors include: advanced age (>6455men, 30>65 women);hypertension;male gender     Objective:    Today's Vitals   01/18/23 1115  Weight: 233 lb (105.7 kg)  Height: 5\' 10"  (1.778 m)   Body mass index is 33.43 kg/m.     01/18/2023   11:30 AM 11/10/2022    2:28 PM 11/04/2022    7:56 AM 10/27/2022   10:09 AM 11/13/2021   10:13 AM 05/26/2021    6:24 PM 05/25/2021    3:58 AM  Advanced Directives  Does Patient Have a Medical Advance Directive? Yes Yes Yes Yes Yes  No  Type of Estate agentAdvance Directive Healthcare Power of [place];Living will  Living will Living will Healthcare Power of [place];Living will    Does patient want to make changes to medical advance directive?  No - Patient declined   No - Patient declined    Copy of Healthcare Power of Attorney in Chart? No - copy requested    No - copy requested    Would patient like information on creating a medical advance directive?       No - Patient declined     Current Medications (verified) Outpatient Encounter Medications as of 01/18/2023  Medication Sig   acetaminophen (TYLENOL) 325 MG tablet Take 2 tablets (650 mg total) by mouth every 6 (six) hours as needed for mild pain (or Fever >/= 101).   albuterol (VENTOLIN HFA) 108 (90 Base) MCG/ACT inhaler USE 2 INHALATIONS EVERY 6 HOURS AS NEEDED FOR WHEEZING OR SHORTNESS OF BREATH (Patient taking differently: Inhale 2 puffs into the lungs every 6 (six) hours as needed for shortness of breath or wheezing.)   amLODipine (NORVASC) 5 MG tablet TAKE 1 TABLET DAILY   BESIVANCE 0.6 % SUSP Place 1 drop into both eyes every 30 (thirty) days.   cyanocobalamin (VITAMIN B12) 1000 MCG/ML injection Inject 1 mL (1,000 mcg total) into the muscle every 30 (thirty) days.   docusate sodium (COLACE) 100 MG capsule Take 1 capsule (100 mg total) by mouth daily as needed for up to 30 doses.   FLOVENT DISKUS 50 MCG/ACT AEPB USE 1 INHALATION TWICE A DAY (Patient taking differently: Take 1 Inhalation by mouth 2 (two) times daily.)   hypromellose (GENTEAL) 0.3 % GEL ophthalmic ointment Place 1 Application into both eyes as needed for dry eyes. States it is liquid   MAGNESIUM GLUCONATE PO Take 1 tablet by mouth daily.   Methylcellulose, Laxative, (CITRUCEL PO) Take 2 tablets by mouth daily after breakfast. (Patient not taking: Reported on  11/02/2022)   morphine (MSIR) 15 MG tablet Take 0.5 tablets (7.5 mg total) by mouth every 4 (four) hours as needed for severe pain. (Patient not taking: Reported on 11/02/2022)   Multiple Vitamins-Minerals (PRESERVISION AREDS 2) CAPS Take 1 capsule by mouth 2 (two) times daily.   omeprazole (PRILOSEC) 40 MG capsule Take 1 capsule (40 mg total) by mouth daily. (Patient taking differently: Take 40 mg by mouth daily as needed (Acid reflux).)   ondansetron (ZOFRAN-ODT) 4 MG disintegrating tablet Take 1 tablet (4 mg total) by mouth every 4 (four) hours as needed for  nausea/vomiting.   oxyCODONE-acetaminophen (PERCOCET) 5-325 MG tablet Take 1 tablet by mouth every 4 (four) hours as needed for up to 18 doses for severe pain.   oxyCODONE-acetaminophen (PERCOCET) 5-325 MG tablet Take 1 tablet by mouth every 4 (four) hours as needed for up to 18 doses for severe pain.   potassium chloride SA (KLOR-CON M) 20 MEQ tablet TAKE 1 TABLET DAILY (Patient taking differently: Take 20 mEq by mouth daily.)   pravastatin (PRAVACHOL) 40 MG tablet TAKE 1 TABLET AT BEDTIME (Patient taking differently: Take 40 mg by mouth daily.)   rivaroxaban (XARELTO) 20 MG TABS tablet TAKE 1 TABLET DAILY   tamsulosin (FLOMAX) 0.4 MG CAPS capsule Take 1 capsule (0.4 mg total) by mouth daily after supper.   valsartan (DIOVAN) 160 MG tablet TAKE 1 TABLET DAILY (Patient taking differently: Take 160 mg by mouth daily.)   [DISCONTINUED] docusate sodium (COLACE) 100 MG capsule Take 1 capsule (100 mg total) by mouth daily as needed for up to 30 doses.   No facility-administered encounter medications on file as of 01/18/2023.    Allergies (verified) Patient has no known allergies.   History: Past Medical History:  Diagnosis Date   Allergic rhinitis    Arthritis    Asthma    Atrial fibrillation    sees Dr. Bedelia [facility] at [facility]    Atrial flutter    BCC (basal cell carcinoma of skin)    Nose   Chronic kidney disease    Complication of anesthesia    COPD with emphysema    sees Dr. Coralyn Helling    Depression    Diabetes mellitus    Diet control , hyperglycemic but not diabetic per Pt 10/2022   Diverticulosis    Erosive esophagitis    Esophageal stricture    Gastric ulcer    Hearing loss    uses amplification   Hemorrhoids    Hiatal hernia    HTN (hypertension)    Hyperlipidemia    Knee problem    2% permanent partial impairment Right   Macular degeneration    Perineal abscess    Pneumonia    Sleep apnea    Past Surgical History:  Procedure Laterality Date    CARDIAC ELECTROPHYSIOLOGY STUDY AND ABLATION     x 4 (sees Dr. Elmer Picker at [facility])   CYST EXCISION PERINEAL     CYSTOSCOPY WITH URETEROSCOPY AND STENT PLACEMENT Right 10/29/2022   Procedure: CYSTOSCOPY WITH RETROGRADE WITH URETHERAL DILITATIONRIGHT DIAGNOSTIC  URETEROSCOPY AND STENT PLACEMENT;  Surgeon: Jannifer Hick, MD;  Location: [facility];  Service: Urology;  Laterality: Right;   CYSTOSCOPY/URETEROSCOPY/HOLMIUM LASER/STENT PLACEMENT Right 11/27/2022   Procedure: CYSTOSCOPY/ RIGHT RETROGRADE PYELOGRAM/RIGHT HOLMIUM LASER/RIGHT STENT PLACEMENT/BASKET;  Surgeon: Jannifer Hick, MD;  Location: [facility];  Service: Urology;  Laterality: Right;  60 MINUTES NEEDED FOR CASE   INSERT / REPLACE / REMOVE PACEMAKER  NOSE SURGERY     septoplasty   PENILE BIOPSY N/A 11/27/2022   Procedure: PENILE BIOPSY;  Surgeon: Jannifer Hick, MD;  Location: [facility];  Service: Urology;  Laterality: N/A;   UVULOPALATOPHARYNGOPLASTY  06/13/1979   VASECTOMY  06/12/1969   w/complications   Family History  Problem Relation Age of Onset   Stroke Mother    Breast cancer Mother    Heart attack Mother    Other Mother        brain tunor   Heart disease Father    Hypertension Father    Bladder Cancer Father    Parkinson's disease Father    Stroke Father    Breast cancer Brother    Atrial fibrillation Brother    Tremor Brother    Stroke Brother    Hypertension Brother    Breast cancer Maternal Grandmother    Heart disease Maternal Grandmother    Heart disease Maternal Grandfather    Arthritis Daughter    Heart disease Daughter    Colitis Daughter        Lymphocytic   Colon cancer Neg Hx    Social History   Socioeconomic History   Marital status: Married    Spouse name: Not on file   Number of children: 2   Years of education: Not on file   Highest education level: Master's degree (e.g., MA, MS, MEng, MEd, MSW, MBA)  Occupational History   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   Occupation: Med Engineer, manufacturing   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 He does not want heroic or futile care especially if he cannon communicate, read and enjoy his loved ones.     Social Determinants of Health   Financial Resource Strain: Low Risk  (01/18/2023)   Overall Financial Resource Strain (CARDIA)    Difficulty of Paying Living Expenses: Not hard at all  Food Insecurity: No Food Insecurity (01/18/2023)   Hunger Vital Sign    Worried About Running Out of Food in the Last Year: Never true    Ran Out of Food in the Last Year: Never true  Transportation Needs: No Transportation Needs (01/18/2023)   PRAPARE - Administrator, Civil Service (Medical): No    Lack of Transportation (Non-Medical): No  Physical Activity: Inactive (01/18/2023)   Exercise Vital Sign    Days of Exercise per Week: 0 days    Minutes of Exercise per Session: 0 min  Stress: No Stress Concern Present (01/18/2023)   [employer] of Occupational Health - Occupational Stress Questionnaire    Feeling of Stress : Not at all  Social Connections: Socially Integrated (01/18/2023)   Social Connection and Isolation Panel [NHANES]    Frequency of Communication with Friends and Family: More than three  times a week    Frequency of Social Gatherings with Friends and Family: More than three times a week    Attends Religious Services: More  than 4 times per year    Active Member of Clubs or Organizations: Yes    Attends Engineer, structural: More than 4 times per year    Marital Status: Married    Tobacco Counseling Counseling given: Not Answered   Clinical Intake:  Pre-visit preparation completed: Yes  Pain : No/denies pain     BMI - recorded: 33.43 Nutritional Status: BMI > 30  Obese Nutritional Risks: None Diabetes: No  How often do you need to have someone help you when you read instructions, pamphlets, or other written materials from your doctor or pharmacy?: 3 - Sometimes (Wife assist)  Diabetic?  No  Interpreter Needed?: No  Information entered by :: Theresa Mulligan LPN   Activities of Daily Living    01/18/2023   11:24 AM 01/13/2023   12:11 PM  In your present state of health, do you have any difficulty performing the following activities:  Hearing? 1 1  Comment Wears hearing aids   Vision? 1 1  Comment Followed by Optomologist   Difficulty concentrating or making decisions? 0 0  Walking or climbing stairs? 0 0  Dressing or bathing? 0 0  Doing errands, shopping? 0 1  Preparing Food and eating ? N N  Using the Toilet? N Y  In the past six months, have you accidently leaked urine? N Y  Do you have problems with loss of bowel control? N N  Managing your Medications? N N  Managing your Finances? N N  Housekeeping or managing your Housekeeping? N N    Patient Care Team: Nelwyn Salisbury, MD as PCP - General (Family Medicine) Regan Lemming, MD as PCP - Electrophysiology (Cardiology) Rendall, Cresenciano Genre, MD (Inactive) (Orthopedic Surgery) Mardella Layman, MD (Gastroenterology) Bedelia Person, MD as Referring Physician (Cardiology) Clance, Maree Krabbe, MD (Pulmonary Disease) Sherrie George, MD as Consulting Physician (Ophthalmology) Jannifer Hick, MD as Consulting Physician (Urology) Ernesto Rutherford, MD as Consulting Physician (Ophthalmology) Si Gaul, MD as  Consulting Physician (Oncology)  Indicate any recent Medical Services you may have received from other than [facility] providers in the past year (date may be approximate).     Assessment:   This is a routine wellness examination for Guy Moore.  Hearing/Vision screen Hearing Screening - Comments:: Wears hearing aids Vision Screening - Comments:: Wears rx glasses - up to date with routine eye exams with  Dr Dione Booze  Dietary issues and exercise activities discussed: Current Exercise Habits: The patient does not participate in regular exercise at present, Exercise limited by: None identified   Goals Addressed               This Visit's Progress     Patient Stated (pt-stated)        I would like to stay alive.       Depression Screen    01/18/2023   11:22 AM 11/13/2021   10:03 AM 11/12/2020    1:47 PM 04/12/2015    1:06 PM 01/24/2014    3:42 PM  PHQ 2/9 Scores  PHQ - 2 Score 0 0 0 0 0    Fall Risk    01/18/2023   11:28 AM 01/13/2023   12:11 PM 03/23/2022    8:02 AM 11/13/2021   10:09 AM 11/10/2021   12:29 PM  Fall Risk  Falls in the past year? 1 1 0 0 0  Number falls in past yr: 0 0  0 0  Injury with Fall? 0 0  0 0  Risk for fall due to : No Fall Risks   No Fall Risks   Follow up Falls prevention discussed   Falls evaluation completed     FALL RISK PREVENTION PERTAINING TO THE HOME:  Any stairs in or around the home? Yes  If so, are there any without handrails? No  Home free of loose throw rugs in walkways, pet beds, electrical cords, etc? Yes  Adequate lighting in your home to reduce risk of falls? Yes   ASSISTIVE DEVICES UTILIZED TO PREVENT FALLS:  Life alert? No  Use of a cane, walker or w/c? Yes  Grab bars in the bathroom? No  Shower chair or bench in shower? No  Elevated toilet seat or a handicapped toilet? No   TIMED UP AND GO:  Was the test performed? No . Audio Visit  Cognitive Function:        01/18/2023   11:29 AM 11/13/2021   10:11 AM  6CIT Screen  What  Year? 0 points 0 points  What month? 0 points 0 points  What time? 0 points 0 points  Count back from 20 0 points 0 points  Months in reverse 0 points 0 points  Repeat phrase 0 points 0 points  Total Score 0 points 0 points    Immunizations Immunization History  Administered Date(s) Administered   Fluad Quad(high Dose 65+) 06/21/2019, 07/10/2020, 07/22/2022   Influenza Whole 08/12/2006, 10/21/2007, 07/10/2008, 07/19/2009, 07/12/2012   Influenza, High Dose Seasonal PF 08/25/2018   Influenza, Seasonal, Injecte, Preservative Fre 08/04/2013   Influenza-Unspecified 08/16/2015, 08/15/2016, 08/29/2017, 08/18/2021   PFIZER Comirnaty(Gray Top)Covid-19 Tri-Sucrose Vaccine 02/19/2021   PFIZER(Purple Top)SARS-COV-2 Vaccination 11/19/2019, 12/13/2019, 08/01/2020, 08/02/2020   Pfizer Covid-19 Vaccine Bivalent Booster 48yrs & up 08/18/2021   Pneumococcal Conjugate-13 01/17/2015   Pneumococcal Polysaccharide-23 04/20/1997, 03/14/2013   Td 10/09/2005   Zoster, Live 03/19/2009    TDAP status: Due, Education has been provided regarding the importance of this vaccine. Advised may receive this vaccine at local pharmacy or Health Dept. Aware to provide a copy of the vaccination record if obtained from local pharmacy or Health Dept. Verbalized acceptance and understanding.  Flu Vaccine status: Up to date  Pneumococcal vaccine status: Up to date  Covid-19 vaccine status: Completed vaccines  Qualifies for Shingles Vaccine? Yes   Zostavax completed No   Shingrix Completed?: No.    Education has been provided regarding the importance of this vaccine. Patient has been advised to call insurance company to determine out of pocket expense if they have not yet received this vaccine. Advised may also receive vaccine at local pharmacy or Health Dept. Verbalized acceptance and understanding.  Screening Tests Health Maintenance  Topic Date Due   FOOT EXAM  06/28/2015   DTaP/Tdap/Td (2 - Tdap) 10/10/2015    OPHTHALMOLOGY EXAM  01/18/2023 (Originally 04/03/2022)   COVID-19 Vaccine (7 - 2023-24 season) 02/03/2023 (Originally 06/12/2022)   Zoster Vaccines- Shingrix (1 of 2) 04/19/2023 (Originally 11/26/1952)   HEMOGLOBIN A1C  05/11/2023   INFLUENZA VACCINE  05/13/2023   Medicare Annual Wellness (AWV)  01/18/2024   Pneumonia Vaccine 12+ Years old  Completed   HPV VACCINES  Aged Out    Health Maintenance  Health Maintenance Due  Topic Date Due   FOOT EXAM  06/28/2015   DTaP/Tdap/Td (2 - Tdap) 10/10/2015  Colorectal cancer screening: No longer required.   Lung Cancer Screening: (Low Dose CT Chest recommended if Age 20-80 years, 30 pack-year currently smoking OR have quit w/in 15years.) does not qualify.    Additional Screening:  Hepatitis C Screening: does not qualify; Completed    Vision Screening: Recommended annual ophthalmology exams for early detection of glaucoma and other disorders of the eye. Is the patient up to date with their annual eye exam?  Yes  Who is the provider or what is the name of the office in which the patient attends annual eye exams? Dr Dione Booze If pt is not established with a provider, would they like to be referred to a provider to establish care? No .   Dental Screening: Recommended annual dental exams for proper oral hygiene  Community Resource Referral / Chronic Care Management:  CRR required this visit?  No   CCM required this visit?  No      Plan:     I have personally reviewed and noted the following in the patient's chart:   Medical and social history Use of alcohol, tobacco or illicit drugs  Current medications and supplements including opioid prescriptions. Patient is currently taking opioids Functional ability and status Nutritional status Physical activity Advanced directives List of other physicians Hospitalizations, surgeries, and ER visits in previous 12 months Vitals Screenings to include cognitive, depression, and falls Referrals  and appointments  In addition, I have reviewed and discussed with patient certain preventive protocols, quality metrics, and best practice recommendations. A written personalized care plan for preventive services as well as general preventive health recommendations were provided to patient.     Tillie Rung, LPN   4/0/9811   Nurse Notes: None

## 2023-01-25 ENCOUNTER — Encounter (INDEPENDENT_AMBULATORY_CARE_PROVIDER_SITE_OTHER): Payer: Medicare Other | Admitting: Ophthalmology

## 2023-01-25 DIAGNOSIS — I10 Essential (primary) hypertension: Secondary | ICD-10-CM

## 2023-01-25 DIAGNOSIS — D3131 Benign neoplasm of right choroid: Secondary | ICD-10-CM

## 2023-01-25 DIAGNOSIS — H43813 Vitreous degeneration, bilateral: Secondary | ICD-10-CM

## 2023-01-25 DIAGNOSIS — H353231 Exudative age-related macular degeneration, bilateral, with active choroidal neovascularization: Secondary | ICD-10-CM | POA: Diagnosis not present

## 2023-01-25 DIAGNOSIS — H35033 Hypertensive retinopathy, bilateral: Secondary | ICD-10-CM | POA: Diagnosis not present

## 2023-01-26 NOTE — Patient Instructions (Signed)
SURGICAL WAITING ROOM VISITATION  Patients having surgery or a procedure may have no more than 2 support people in the waiting area - these visitors may rotate.    Children under the age of 16 must have an adult with them who is not the patient.  Due to63 an increase in RSV and influenza rates and associated hospitalizations, children ages 24 and under may not visit patients in [facility] hospitals.  If the patient needs to stay at the hospital during part of their recovery, the visitor guidelines for inpatient rooms apply. Pre-op nurse will coordinate an appropriate time for 1 support person to accompany patient in pre-op.  This support person may not rotate.    Please refer to the [facility] website for the visitor guidelines for Inpatients (after your surgery is over and you are in a regular room).       Your procedure is scheduled on:  02/08/23    Report to [facility] Main Entrance    Report to admitting at   [phone]   Call this number if you have problems the morning of surgery [phone]   Do not eat food : or drink liquid After Midnight.       Oral Hygiene is also important to reduce your risk of infection.                                    Remember - BRUSH YOUR TEETH THE MORNING OF SURGERY WITH YOUR REGULAR TOOTHPASTE  DENTURES WILL BE REMOVED PRIOR TO SURGERY PLEASE DO NOT APPLY "Poly grip" OR ADHESIVES!!!   Do NOT smoke after Midnight   Take these medicines the morning of surgery with A SIP OF WATER: inhalers as usual and bring, amlodipine, omeprazole if needed   DO NOT TAKE ANY ORAL DIABETIC MEDICATIONS DAY OF YOUR SURGERY  Bring CPAP mask and tubing day of surgery.                              You may not have any metal on your body including hair pins, jewelry, and body piercing             Do not wear make-up, lotions, powders, perfumes/cologne, or deodorant  Do not wear nail polish including gel and S&S, artificial/acrylic nails, or any other type  of covering on natural nails including finger and toenails. If you have artificial nails, gel coating, etc. that needs to be removed by a nail salon please have this removed prior to surgery or surgery may need to be canceled/ delayed if the surgeon/ anesthesia feels like they are unable to be safely monitored.   Do not shave  48 hours prior to surgery.               Men may shave face and neck.   Do not bring valuables to the hospital. [facility] IS NOT             RESPONSIBLE   FOR VALUABLES.   Contacts, glasses, dentures or bridgework may not be worn into surgery.   Bring small overnight bag day of surgery.   DO NOT BRING YOUR HOME MEDICATIONS TO THE HOSPITAL. PHARMACY WILL DISPENSE MEDICATIONS LISTED ON YOUR MEDICATION LIST TO YOU DURING YOUR ADMISSION IN THE HOSPITAL!    Patients discharged on the day of surgery will not be allowed  to drive home.  Someone NEEDS to stay with you for the first 24 hours after anesthesia.   Special Instructions: Bring a copy of your healthcare power of attorney and living will documents the day of surgery if you haven't scanned them before.              Please read over the following fact sheets you were given: IF YOU HAVE QUESTIONS ABOUT YOUR PRE-OP INSTRUCTIONS PLEASE CALL [phone]   If you received a COVID test during your pre-op visit  it is requested that you wear a mask when out in public, stay away from anyone that may not be feeling well and notify your surgeon if you develop symptoms. If you test positive for Covid or have been in contact with anyone that has tested positive in the last 10 days please notify you surgeon.    [facility] - Preparing for Surgery Before surgery, you can play an important role.  Because skin is not sterile, your skin needs to be as free of germs as possible.  You can reduce the number of germs on your skin by washing with CHG (chlorahexidine gluconate) soap before surgery.  CHG is an antiseptic cleaner which kills  germs and bonds with the skin to continue killing germs even after washing. Please DO NOT use if you have an allergy to CHG or antibacterial soaps.  If your skin becomes reddened/irritated stop using the CHG and inform your nurse when you arrive at Short Stay. Do not shave (including legs and underarms) for at least 48 hours prior to the first CHG shower.  You may shave your face/neck. Please follow these instructions carefully:  1.  Shower with CHG Soap the night before surgery and the  morning of Surgery.  2.  If you choose to wash your hair, wash your hair first as usual with your  normal  shampoo.  3.  After you shampoo, rinse your hair and body thoroughly to remove the  shampoo.                           4.  Use CHG as you would any other liquid soap.  You can apply chg directly  to the skin and wash                       Gently with a scrungie or clean washcloth.  5.  Apply the CHG Soap to your body ONLY FROM THE NECK DOWN.   Do not use on face/ open                           Wound or open sores. Avoid contact with eyes, ears mouth and genitals (private parts).                       Wash face,  Genitals (private parts) with your normal soap.             6.  Wash thoroughly, paying special attention to the area where your surgery  will be performed.  7.  Thoroughly rinse your body with warm water from the neck down.  8.  DO NOT shower/wash with your normal soap after using and rinsing off  the CHG Soap.                9.  Pat yourself dry with a clean  towel.            10.  Wear clean pajamas.            11.  Place clean sheets on your bed the night of your first shower and do not  sleep with pets. Day of Surgery : Do not apply any lotions/deodorants the morning of surgery.  Please wear clean clothes to the hospital/surgery center.  FAILURE TO FOLLOW THESE INSTRUCTIONS MAY RESULT IN THE CANCELLATION OF YOUR SURGERY PATIENT SIGNATURE_________________________________  NURSE  SIGNATURE__________________________________  ________________________________________________________________________

## 2023-01-26 NOTE — Progress Notes (Addendum)
Anesthesia Review:  PCP: Gershon Crane- LOV 11/02/22  Cardiologist : Loman Brooklyn- Telephone visit- 11/12/22 with [facility]  Pulmonology- Dr Craige Cotta- For followup on 02/04/23  Chest x-ray : EKG : 11/04/22  Device check- 2-6/24  Pacemaker - ORders requested on 01/28/23.   ORders on chart.  Echo : Stress test: Cardiac Cath :  Activity level: can do a flgiht of stairs without difficutly  Sleep Study/ CPAP : has cpap  Fasting Blood Sugar :      / Checks Blood Sugar -- times a day:   Blood Thinner/ Instructions /Last Dose: ASA / Instructions/ Last Dose :   Xarelto- stop 3 days prior per pt    Prediabetes - does not check glucose at home     Hgba1-11/10/22-  6.2 01/28/23-  5.8  11/27/22- cysto    Hearing Aids    PT called at 1030 amd and staed battery died on car.  Informed pt that hx and instructons could be done via phone which were completed on 01/28/23.  PT to arrive at 1pm today for labs and to pick up soap and instrucitons.  PT aware to eat prior to preop appt.  Pt voiced understanding. PT has appt at [facility] at 2pm.   CBC done 01/28/23 with white count of 2.9 and PLTC of 118 routed ot DR Jettie Pagan.

## 2023-01-28 ENCOUNTER — Other Ambulatory Visit: Payer: Self-pay

## 2023-01-28 ENCOUNTER — Encounter: Payer: Self-pay | Admitting: Cardiology

## 2023-01-28 ENCOUNTER — Encounter (HOSPITAL_COMMUNITY)
Admission: RE | Admit: 2023-01-28 | Discharge: 2023-01-28 | Disposition: A | Discharge status: Home / Self Care | Payer: Medicare Other | Source: Ambulatory Visit | Attending: Urology | Admitting: Urology

## 2023-01-28 ENCOUNTER — Encounter (HOSPITAL_COMMUNITY): Payer: Self-pay | Admitting: Urology

## 2023-01-28 DIAGNOSIS — I4891 Unspecified atrial fibrillation: Secondary | ICD-10-CM | POA: Insufficient documentation

## 2023-01-28 DIAGNOSIS — D074 Carcinoma in situ of penis: Secondary | ICD-10-CM | POA: Diagnosis not present

## 2023-01-28 DIAGNOSIS — J439 Emphysema, unspecified: Secondary | ICD-10-CM | POA: Diagnosis not present

## 2023-01-28 DIAGNOSIS — I129 Hypertensive chronic kidney disease with stage 1 through stage 4 chronic kidney disease, or unspecified chronic kidney disease: Secondary | ICD-10-CM | POA: Diagnosis not present

## 2023-01-28 DIAGNOSIS — Z01818 Encounter for other preprocedural examination: Secondary | ICD-10-CM | POA: Diagnosis not present

## 2023-01-28 DIAGNOSIS — N400 Enlarged prostate without lower urinary tract symptoms: Secondary | ICD-10-CM | POA: Insufficient documentation

## 2023-01-28 DIAGNOSIS — G4730 Sleep apnea, unspecified: Secondary | ICD-10-CM | POA: Diagnosis not present

## 2023-01-28 DIAGNOSIS — N4231 Prostatic intraepithelial neoplasia: Secondary | ICD-10-CM | POA: Diagnosis not present

## 2023-01-28 DIAGNOSIS — E785 Hyperlipidemia, unspecified: Secondary | ICD-10-CM | POA: Diagnosis not present

## 2023-01-28 DIAGNOSIS — R338 Other retention of urine: Secondary | ICD-10-CM | POA: Diagnosis not present

## 2023-01-28 LAB — CBC
HCT: 40.3 % (ref 39.0–52.0)
Hemoglobin: 13 g/dL (ref 13.0–17.0)
MCH: 29.1 pg (ref 26.0–34.0)
MCHC: 32.3 g/dL (ref 30.0–36.0)
MCV: 90.4 fL (ref 80.0–100.0)
Platelets: 118 10*3/uL — ABNORMAL LOW (ref 150–400)
RBC: 4.46 MIL/uL (ref 4.22–5.81)
RDW: 14.8 % (ref 11.5–15.5)
WBC: 2.9 10*3/uL — ABNORMAL LOW (ref 4.0–10.5)
nRBC: 0 % (ref 0.0–0.2)

## 2023-01-28 LAB — BASIC METABOLIC PANEL
Anion gap: 7 (ref 5–15)
BUN: 21 mg/dL (ref 8–23)
CO2: 25 mmol/L (ref 22–32)
Calcium: 8.8 mg/dL — ABNORMAL LOW (ref 8.9–10.3)
Chloride: 103 mmol/L (ref 98–111)
Creatinine, Ser: 1.09 mg/dL (ref 0.61–1.24)
GFR, Estimated: >60 mL/min (ref >60)
Glucose, Bld: 96 mg/dL (ref 70–99)
Potassium: 4.4 mmol/L (ref 3.5–5.1)
Sodium: 135 mmol/L (ref 135–145)

## 2023-01-28 LAB — HEMOGLOBIN A1C
Hgb A1c MFr Bld: 5.8 % — ABNORMAL HIGH (ref 4.8–5.6)
Mean Plasma Glucose: 119.76 mg/dL

## 2023-01-28 LAB — GLUCOSE, CAPILLARY: Glucose-Capillary: 97 mg/dL (ref 70–99)

## 2023-01-28 NOTE — Progress Notes (Signed)
PERIOPERATIVE PRESCRIPTION FOR IMPLANTED CARDIAC DEVICE PROGRAMMING  Patient Information: Name:  Guy Moore  DOB:  [birth year]  MRN:  [id number]    Planned Procedure:   Bipolar TURP, CO2 laser ablation at penis  Surgeon:  DR Jettie Pagan  Date of Procedure:   02/08/23  Cautery will be used.  Position during surgery:   Please send documentation back to:  [facility] (Fax # [phone])  Device Information:  Clinic EP Physician:  Loman Brooklyn, MD   Device Type:  Pacemaker Manufacturer and Phone #:  Medtronic: [phone] Pacemaker Dependent?:  No. Date of Last Device Check:  11/16/2022 Normal Device Function?:  Yes.    Electrophysiologist's Recommendations:  Have magnet available. Provide continuous ECG monitoring when magnet is used or reprogramming is to be performed.  Procedure should not interfere with device function.  No device programming or magnet placement needed.  Per Device Clinic Standing Orders, Lenor Coffin, RN  11:37 AM 01/28/2023

## 2023-01-29 ENCOUNTER — Encounter (HOSPITAL_COMMUNITY): Payer: Self-pay | Admitting: Physician Assistant

## 2023-01-30 ENCOUNTER — Other Ambulatory Visit: Payer: Self-pay | Admitting: Family Medicine

## 2023-02-01 ENCOUNTER — Ambulatory Visit (INDEPENDENT_AMBULATORY_CARE_PROVIDER_SITE_OTHER): Payer: Medicare Other

## 2023-02-01 DIAGNOSIS — E538 Deficiency of other specified B group vitamins: Secondary | ICD-10-CM | POA: Diagnosis not present

## 2023-02-01 MED ORDER — CYANOCOBALAMIN 1000 MCG/ML IJ SOLN
1000.0000 ug | Freq: Once | INTRAMUSCULAR | Status: AC
Start: 2023-02-01 — End: 2023-02-01
  Administered 2023-02-01: 1000 ug via INTRAMUSCULAR

## 2023-02-01 NOTE — Progress Notes (Signed)
Anesthesia chart review   Case: [id number] Date/Time: 02/08/23 0715   Procedures:      BIPOLAR TRANSURETHRAL RESECTION OF THE PROSTATE (TURP)     CO2 LASER ABLATION AT THE PENIS   Anesthesia type: General   Pre-op diagnosis: BENIGN PROSTATIC HYPERPLASIA, PENILE INTRAEPITHELIAL NEOPLASIA   Location: [facility] / [facility]   Surgeons: Jannifer Hick, MD       DISCUSSION: 89 year old former smoker with history of HTN, A-fib, pacemaker in place (device orders in progress note 01/28/2023), COPD, sleep apnea with CPAP, CKD, BPH, penile intraepithelial neoplasm scheduled for above procedure 02/08/2023 with Dr. Jettie Pagan.  Pt last seen by cardiology 11/12/2022. Per OV note, " -[name] is a pleasant 89 year old male with past medical history of A-fib status post multiple ablation, sick sinus syndrome s/p Medtronic PPM, hypertension, hyperlipidemia and OSA who is undergoing cystoscopy with right pyelogram and laser lithotripsy by Dr. Cardell Peach.  He does not have a history of MI.  He denies any recent exertional chest pain or worsening dyspnea.  He is at acceptable risk to proceed from the cardiac perspective without further workup.  At the recommendation of our clinical pharmacist based on his creatinine clearance and age, we recommend hold Xarelto for 2 days prior to the procedure and restart as soon as possible afterward at the discretion of surgeon based on bleeding risk."   VS: BP (!) 151/92   Pulse 88   Temp 37.1 C (Oral)   Resp 18   Ht  (1.778 m)   Wt 106.6 kg   SpO2 100%   BMI 33.72 kg/m   PROVIDERS: Nelwyn Salisbury, MD is PCP  Cardiologist : Loman Brooklyn  LABS: Labs reviewed: Acceptable for surgery. (all labs ordered are listed, but only abnormal results are displayed)  Labs Reviewed  BASIC METABOLIC PANEL - Abnormal; Notable for the following components:      Result Value   Calcium 8.8 (*)    All other components within normal limits  CBC - Abnormal; Notable for the  following components:   WBC 2.9 (*)    Platelets 118 (*)    All other components within normal limits  HEMOGLOBIN A1C - Abnormal; Notable for the following components:   Hgb A1c MFr Bld 5.8 (*)    All other components within normal limits  GLUCOSE, CAPILLARY     IMAGES:   EKG:   CV:  Past Medical History:  Diagnosis Date   Allergic rhinitis    Arthritis    Asthma    Atrial fibrillation    sees Dr. Bedelia Person at [facility]    Atrial flutter    BCC (basal cell carcinoma of skin)    Nose   Chronic kidney disease    Complication of anesthesia    hx of one time with problem intubation years ago   COPD with emphysema    sees Dr. Coralyn Helling    Diverticulosis    Dysrhythmia    afib, flutter and tachycardia   Erosive esophagitis    Esophageal stricture    Gastric ulcer    Hearing loss    uses amplification   Hemorrhoids    Hiatal hernia    History of kidney stones    HTN (hypertension)    Hyperlipidemia    Knee problem    2% permanent partial impairment Right   Macular degeneration    Perineal abscess    Pneumonia    Pre-diabetes  Presence of permanent cardiac pacemaker    Sleep apnea    cpap    Past Surgical History:  Procedure Laterality Date   CARDIAC ELECTROPHYSIOLOGY STUDY AND ABLATION     x 4 (sees Dr. Elmer Picker at [facility])   CYST EXCISION PERINEAL     CYSTOSCOPY WITH URETEROSCOPY AND STENT PLACEMENT Right 10/29/2022   Procedure: CYSTOSCOPY WITH RETROGRADE WITH URETHERAL DILITATIONRIGHT DIAGNOSTIC  URETEROSCOPY AND STENT PLACEMENT;  Surgeon: Jannifer Hick, MD;  Location: [facility];  Service: Urology;  Laterality: Right;   CYSTOSCOPY/URETEROSCOPY/HOLMIUM LASER/STENT PLACEMENT Right 11/27/2022   Procedure: CYSTOSCOPY/ RIGHT RETROGRADE PYELOGRAM/RIGHT HOLMIUM LASER/RIGHT STENT PLACEMENT/BASKET;  Surgeon: Jannifer Hick, MD;  Location: [facility];  Service: Urology;  Laterality: Right;  60 MINUTES NEEDED FOR CASE   INSERT / REPLACE  / REMOVE PACEMAKER     NOSE SURGERY     septoplasty   PENILE BIOPSY N/A 11/27/2022   Procedure: PENILE BIOPSY;  Surgeon: Jannifer Hick, MD;  Location: [facility];  Service: Urology;  Laterality: N/A;   UVULOPALATOPHARYNGOPLASTY  06/13/1979   VASECTOMY  06/12/1969   w/complications    MEDICATIONS:  albuterol (VENTOLIN HFA) 108 (90 Base) MCG/ACT inhaler   amLODipine (NORVASC) 5 MG tablet   BESIVANCE 0.6 % SUSP   Carboxymethylcellul-Glycerin (LUBRICATING EYE DROPS OP)   cyanocobalamin (VITAMIN B12) 1000 MCG/ML injection   docusate sodium (COLACE) 100 MG capsule   FLOVENT DISKUS 50 MCG/ACT AEPB   hydrocortisone cream 1 %   MAGNESIUM GLUCONATE PO   Methylcellulose, Laxative, (CITRUCEL PO)   morphine (MSIR) 15 MG tablet   Multiple Vitamins-Minerals (PRESERVISION AREDS 2) CAPS   neomycin-bacitracin-polymyxin (NEOSPORIN) OINT   omeprazole (PRILOSEC) 40 MG capsule   ondansetron (ZOFRAN-ODT) 4 MG disintegrating tablet   oxyCODONE-acetaminophen (PERCOCET) 5-325 MG tablet   oxyCODONE-acetaminophen (PERCOCET) 5-325 MG tablet   potassium chloride SA (KLOR-CON M) 20 MEQ tablet   pravastatin (PRAVACHOL) 40 MG tablet   rivaroxaban (XARELTO) 20 MG TABS tablet   tamsulosin (FLOMAX) 0.4 MG CAPS capsule   valsartan (DIOVAN) 160 MG tablet   No current facility-administered medications for this encounter.     Jodell Cipro Ward, PA-C [facility] [phone]

## 2023-02-01 NOTE — Progress Notes (Signed)
Per orders of Dr. Fry, injection of Cyanocobalamin 1000 mcg given by Katlin Bortner L Levie Owensby. °Patient tolerated injection well.  °

## 2023-02-02 ENCOUNTER — Telehealth: Payer: Self-pay | Admitting: Family Medicine

## 2023-02-02 NOTE — Telephone Encounter (Signed)
Patient would like the nurse to call him regarding whether he can take a substitute medication for Claritin to help his urinary problems because he is having surgery next week.  Please call patient to discuss further at [phone]

## 2023-02-02 NOTE — Telephone Encounter (Signed)
He can try using flonase nasal spray 1 spray in each nostril daily prn for allergies.  This can take the place of claritin and shouldn't have any issues with him having prostate surgery.

## 2023-02-02 NOTE — Telephone Encounter (Signed)
Please advise 

## 2023-02-03 ENCOUNTER — Other Ambulatory Visit: Payer: Self-pay | Admitting: Pulmonary Disease

## 2023-02-03 NOTE — Telephone Encounter (Signed)
ATC X1 LVM for patient to call the office back 

## 2023-02-04 ENCOUNTER — Ambulatory Visit (INDEPENDENT_AMBULATORY_CARE_PROVIDER_SITE_OTHER): Payer: Medicare Other | Admitting: Pulmonary Disease

## 2023-02-04 ENCOUNTER — Encounter (HOSPITAL_BASED_OUTPATIENT_CLINIC_OR_DEPARTMENT_OTHER): Payer: Self-pay | Admitting: Pulmonary Disease

## 2023-02-04 VITALS — BP 110/80 | HR 111 | Ht 70.0 in | Wt 237.0 lb

## 2023-02-04 DIAGNOSIS — J441 Chronic obstructive pulmonary disease with (acute) exacerbation: Secondary | ICD-10-CM

## 2023-02-04 MED ORDER — PREDNISONE 10 MG PO TABS: ORAL_TABLET | ORAL | 0 refills | Status: AC

## 2023-02-04 MED ORDER — FLUTICASONE PROPIONATE 50 MCG/ACT NA SUSP: 1.0000 | Freq: Every day | NASAL | 2 refills | Status: AC

## 2023-02-04 MED ORDER — FLUTICASONE-SALMETEROL 250-50 MCG/ACT IN AEPB: 1.0000 | INHALATION_SPRAY | Freq: Two times a day (BID) | RESPIRATORY_TRACT | 5 refills | Status: DC

## 2023-02-04 NOTE — Progress Notes (Signed)
[facility]  Chief Complaint  Patient presents with   Follow-up    Having surgery on Monday and having some breathing issues    Constitutional:  BP 110/80 (BP Location: Right Arm, Cuff Size: Large)   Pulse (!) 111   Ht 5\' 10"  (1.778 m)   Wt 237 lb (107.5 kg)   SpO2 96%   BMI 34.01 kg/m   Past Medical History:  DM, HTN, HLD, HH, Gastric ulcer, Esophageal stricture, GERD, Diverticulosis, Depression, A fib/flutter, OA, Allergies  Past Surgical History:  He  has a past surgical history that includes Uvulopalatopharyngoplasty (06/13/1979); Vasectomy (06/12/1969); Cardiac electrophysiology study and ablation; Nose surgery; Cyst excision perineal; Cystoscopy with ureteroscopy and stent placement (Right, 10/29/2022); Insert / replace / remove pacemaker; Cystoscopy/ureteroscopy/holmium laser/stent placement (Right, 11/27/2022); and Penile biopsy (N/A, 11/27/2022).  Brief Summary:  Guy Moore is a 89 y.o. 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      Subjective:   He is here with his wife.  He wasn't able to use claritin because of upcoming prostate surgery.  He started getting more sinus congestion.  He now has a cough with clear sputum.  He feels hoarse and raspy.  He is having more trouble breathing when walking.  No fever or hemoptysis.  Has been using flovent.  Has needed to use albuterol several times per day.  No issues using CPAP.  Physical Exam:   Appearance - well kempt, foley catheter in place  ENMT - no sinus tenderness, no oral exudate, no LAN, Mallampati 3 airway, no stridor, clear nasal congestion  Respiratory - b/l expiratory wheezing with frequent coughing spells  CV - s1s2 regular rate and rhythm, no murmurs  Ext - no clubbing, no edema  Skin - no rashes  Psych - normal mood and affect     Pulmonary testing:  FeNO 08/04/16 >> 50 PFT 12/01/16 >> FEV1 1.93 (68%),  FEV1% 61, TLC 6.59 (93%), DLCO 58%, +BD  Chest Imaging:  CT angio chest 12/23/07 >> mild emphysema, scarring RML CT angio chest 11/21/18 >> mild centrilobular emphysema, mild mosaic perfusion CT chest 05/25/21 >> mild basilar ATX, mild centrilobular emphysema  Sleep Tests:  PSG 07/24/09 >> AHI 58, SpO2 low 74% Auto CPAP 01/04/23 to 02/03/23 >> used on 30 of 30 nights with average 9 hrs 44 min.  Average AHI 2 with median CPAP 7 and 95 th percentile CPAP 11 cm H2O  Social History:  He  reports that he quit smoking about 45 years ago. His smoking use included cigarettes. He has a 28.00 pack-year smoking history. He has never used smokeless tobacco. He reports that he does not currently use alcohol. He reports that he does not use drugs.  Family History:  His family history includes Arthritis in his daughter; Atrial fibrillation in his brother; Bladder Cancer in his father; Breast cancer in his brother, maternal grandmother, and mother; Colitis in his daughter; Heart attack in his mother; Heart disease in his daughter, father, maternal grandfather, and maternal grandmother; Hypertension in his brother and father; Other in his mother; Parkinson's disease in his father; Stroke in his brother, father, and mother; Tremor in his brother.     Assessment/Plan:   COPD with asthma and emphysema. - he has exacerbation likely related to environmental pollen exposure - don't think he needs additional antibiotics - will give him a course of prednisone - will change from flovent to advair 250  one puff bid - add in flonase 1 spray daily - prn albuterol   Obstructive sleep apnea. - he is compliant with CPAP and reports benefit from therapy - uses [employer] for his DME - continue auto CPAP 5 to 15 cm H2O  Prostate enlargement. - he is scheduled for TURP with Dr. Alvester Morin on 02/08/23 - advised him that this will need to be delayed until he has follow up pulmonary assessment in two weeks  Time Spent Involved  in Patient Care on Day of Examination:  38 minutes  Follow up:   Patient Instructions  Prednisone 10 mg pill >> 3 pills daily for 2 days, 2 pills daily for 2 days, 1 pill daily for 2 days  Advair 1 puff in the morning and 1 puff in the evening, and rinse your mouth after each use  Flonase 1 spray in each nostril daily  Follow up in 2 weeks with Dr. Craige Cotta or a Nurse Practitioner  Medication List:   Allergies as of 02/04/2023   No Known Allergies      Medication List        Accurate as of February 04, 2023  2:05 PM. If you have any questions, ask your nurse or doctor.          STOP taking these medications    Flovent Diskus 50 MCG/ACT Aepb Generic drug: Fluticasone Propionate (Inhal) Stopped by: Coralyn Helling, MD       TAKE these medications    albuterol 108 (90 Base) MCG/ACT inhaler Commonly known as: VENTOLIN HFA USE 2 INHALATIONS EVERY 6 HOURS AS NEEDED FOR WHEEZING OR SHORTNESS OF BREATH   amLODipine 5 MG tablet Commonly known as: NORVASC TAKE 1 TABLET DAILY   Besivance 0.6 % Susp Generic drug: Besifloxacin HCl Place 1 drop into both eyes See admin instructions. Instill 1 drop into both eyes 3 times daily on the day of monthly eye injections then 4 times daily the day after   CITRUCEL PO Take 1-2 tablets by mouth daily as needed (constipation).   cyanocobalamin 1000 MCG/ML injection Commonly known as: VITAMIN B12 Inject 1 mL (1,000 mcg total) into the muscle every 30 (thirty) days.   docusate sodium 100 MG capsule Commonly known as: Colace Take 1 capsule (100 mg total) by mouth daily as needed for up to 30 doses.   fluticasone 50 MCG/ACT nasal spray Commonly known as: FLONASE Place 1 spray into both nostrils daily. Started by: Coralyn Helling, MD   fluticasone-salmeterol 250-50 MCG/ACT Aepb Commonly known as: ADVAIR Inhale 1 puff into the lungs in the morning and at bedtime. Started by: Coralyn Helling, MD   hydrocortisone cream 1 % Apply 1 Application  topically daily as needed for itching.   LUBRICATING EYE DROPS OP Place 1 drop into both eyes daily as needed (dry eyes).   MAGNESIUM GLUCONATE PO Take 1 tablet by mouth daily.   morphine 15 MG tablet Commonly known as: MSIR Take 0.5 tablets (7.5 mg total) by mouth every 4 (four) hours as needed for severe pain.   neomycin-bacitracin-polymyxin Oint Commonly known as: NEOSPORIN Apply 1 Application topically as needed for wound care.   omeprazole 40 MG capsule Commonly known as: PRILOSEC Take 1 capsule (40 mg total) by mouth daily. What changed:  when to take this reasons to take this   ondansetron 4 MG disintegrating tablet Commonly known as: ZOFRAN-ODT Take 1 tablet (4 mg total) by mouth every 4 (four) hours as needed for nausea/vomiting.   oxyCODONE-acetaminophen 5-325  MG tablet Commonly known as: Percocet Take 1 tablet by mouth every 4 (four) hours as needed for up to 18 doses for severe pain.   oxyCODONE-acetaminophen 5-325 MG tablet Commonly known as: Percocet Take 1 tablet by mouth every 4 (four) hours as needed for up to 18 doses for severe pain.   potassium chloride SA 20 MEQ tablet Commonly known as: KLOR-CON M TAKE 1 TABLET DAILY   pravastatin 40 MG tablet Commonly known as: PRAVACHOL TAKE 1 TABLET AT BEDTIME What changed: when to take this   predniSONE 10 MG tablet Commonly known as: DELTASONE Take 3 tablets (30 mg total) by mouth daily with breakfast for 2 days, THEN 2 tablets (20 mg total) daily with breakfast for 2 days, THEN 1 tablet (10 mg total) daily with breakfast for 2 days. Start taking on: February 04, 2023 Started by: Coralyn Helling, MD   PreserVision AREDS 2 Caps Take 1 capsule by mouth 2 (two) times daily.   tamsulosin 0.4 MG Caps capsule Commonly known as: FLOMAX Take 1 capsule (0.4 mg total) by mouth daily after supper.   valsartan 160 MG tablet Commonly known as: DIOVAN TAKE 1 TABLET DAILY   Xarelto 20 MG Tabs tablet Generic drug:  rivaroxaban TAKE 1 TABLET DAILY        Signature:  Coralyn Helling, MD [facility] Pager - [phone] 02/04/2023, 2:05 PM

## 2023-02-04 NOTE — Patient Instructions (Signed)
Prednisone 10 mg pill >> 3 pills daily for 2 days, 2 pills daily for 2 days, 1 pill daily for 2 days  Advair 1 puff in the morning and 1 puff in the evening, and rinse your mouth after each use  Flonase 1 spray in each nostril daily  Follow up in 2 weeks with Dr. Craige Cotta or a Nurse Practitioner

## 2023-02-08 ENCOUNTER — Encounter (HOSPITAL_COMMUNITY): Admission: RE | Payer: Self-pay | Source: Ambulatory Visit

## 2023-02-08 ENCOUNTER — Ambulatory Visit (HOSPITAL_COMMUNITY): Admission: RE | Admit: 2023-02-08 | Payer: Medicare Other | Source: Ambulatory Visit | Admitting: Urology

## 2023-02-08 DIAGNOSIS — Z01818 Encounter for other preprocedural examination: Secondary | ICD-10-CM

## 2023-02-08 HISTORY — DX: Personal history of urinary calculi: Z87.442

## 2023-02-08 HISTORY — DX: Cardiac arrhythmia, unspecified: I49.9

## 2023-02-08 HISTORY — DX: Presence of cardiac pacemaker: Z95.0

## 2023-02-08 HISTORY — DX: Prediabetes: R73.03

## 2023-02-08 SURGERY — TURP (TRANSURETHRAL RESECTION OF PROSTATE)
Anesthesia: General

## 2023-02-15 ENCOUNTER — Ambulatory Visit (INDEPENDENT_AMBULATORY_CARE_PROVIDER_SITE_OTHER): Payer: Medicare Other

## 2023-02-15 DIAGNOSIS — I4819 Other persistent atrial fibrillation: Secondary | ICD-10-CM | POA: Diagnosis not present

## 2023-02-15 LAB — CUP PACEART REMOTE DEVICE CHECK
Battery Remaining Longevity: 94 mo
Battery Voltage: 2.98 V
Brady Statistic AP VP Percent: 0 %
Brady Statistic AP VS Percent: 0 %
Brady Statistic AS VP Percent: 87.34 %
Brady Statistic AS VS Percent: 12.66 %
Brady Statistic RA Percent Paced: 0 %
Brady Statistic RV Percent Paced: 87.34 %
Date Time Interrogation Session: 20240505211358
Implantable Lead Connection Status: 753985
Implantable Lead Connection Status: 753985
Implantable Lead Implant Date: 20180124
Implantable Lead Implant Date: 20180124
Implantable Lead Location: 753859
Implantable Lead Location: 753860
Implantable Lead Model: 5076
Implantable Lead Model: 5076
Implantable Pulse Generator Implant Date: 20180124
Lead Channel Impedance Value: 304 Ohm
Lead Channel Impedance Value: 323 Ohm
Lead Channel Impedance Value: 380 Ohm
Lead Channel Impedance Value: 418 Ohm
Lead Channel Pacing Threshold Amplitude: 0.75 V
Lead Channel Pacing Threshold Pulse Width: 0.4 ms
Lead Channel Sensing Intrinsic Amplitude: 0.25 mV
Lead Channel Sensing Intrinsic Amplitude: 0.25 mV
Lead Channel Sensing Intrinsic Amplitude: 7 mV
Lead Channel Sensing Intrinsic Amplitude: 7 mV
Lead Channel Setting Pacing Amplitude: 1.5 V
Lead Channel Setting Pacing Pulse Width: 0.4 ms
Lead Channel Setting Sensing Sensitivity: 0.9 mV
Zone Setting Status: 755011

## 2023-02-17 ENCOUNTER — Ambulatory Visit (INDEPENDENT_AMBULATORY_CARE_PROVIDER_SITE_OTHER): Payer: Medicare Other | Admitting: Primary Care

## 2023-02-17 ENCOUNTER — Encounter: Payer: Self-pay | Admitting: Primary Care

## 2023-02-17 VITALS — BP 112/74 | HR 96 | Temp 97.8°F | Ht 70.0 in | Wt 229.0 lb

## 2023-02-17 DIAGNOSIS — J4489 Other specified chronic obstructive pulmonary disease: Secondary | ICD-10-CM | POA: Diagnosis not present

## 2023-02-17 DIAGNOSIS — G4733 Obstructive sleep apnea (adult) (pediatric): Secondary | ICD-10-CM

## 2023-02-17 NOTE — Patient Instructions (Addendum)
Recommendation: - Try AYR over the counter nasal gel  - Continue fluticasone nasal spray once daily (after allergy season can use as needed only) - Continue Advair one puff morning and evening (rinse mouth after use) - Continue to wear CPAP nightly  - Ok to reschedule TURP procedure   Follow-up: 6 months with Dr. Craige Cotta or sooner if needed

## 2023-02-17 NOTE — Progress Notes (Signed)
@Patient  ID: Guy Moore, male    DOB: 11-28-1933, 89 y.o.   MRN: [id number]  Chief Complaint  Patient presents with   Follow-up    SOB improved.  Able to climb stairs at home normal.  Did have bloody drainage from nose this morning.    Referring provider: Nelwyn Salisbury, MD  HPI: 89 year old male,  PSG 07/24/09 >> AHI 58, SpO2 low 74%   02/17/2023 Patient presents today for 2-week follow-up.  He was last seen on April 25 by Dr. Craige Cotta for annual OSA visit, he was treated for acute COPD exacerbation. Symptoms were flared due to allerges. He had needed to stop claritin prior for TURP procedure. He was given prednisone taper changed from Flovent to Advair 250 puff twice daily. Advised to start flonase nasal spray while of oral antihistamine. TURP procedure was cancelled and date to   Breathing wise he is feeling much better No longer having difficulty climbing stairs Sinuses feel more clear Using Advair twice daily  He is not requiring Albuterol as frequently, last used 2 weeks ago   Airview download 01/17/23-02/15/23 Days (100%) greater than 4 hours Average usage 9 hours 35 minutes Air leaks 19.2 L/min (95%) Pressure 5-15 (11.3 cm H2O-95%) AHI 1.7  No Known Allergies  Immunization History  Administered Date(s) Administered   Fluad Quad(high Dose 65+) 06/21/2019, 07/10/2020, 07/22/2022   Influenza Whole 08/12/2006, 10/21/2007, 07/10/2008, 07/19/2009, 07/12/2012   Influenza, High Dose Seasonal PF 08/25/2018   Influenza, Seasonal, Injecte, Preservative Fre 08/04/2013   Influenza-Unspecified 08/16/2015, 08/15/2016, 08/29/2017, 08/18/2021   PFIZER Comirnaty(Gray Top)Covid-19 Tri-Sucrose Vaccine 02/19/2021   PFIZER(Purple Top)SARS-COV-2 Vaccination 11/19/2019, 12/13/2019, 08/01/2020, 08/02/2020   Pfizer Covid-19 Vaccine Bivalent Booster 72yrs & up 08/18/2021   Pneumococcal Conjugate-13 01/17/2015   Pneumococcal Polysaccharide-23 04/20/1997, 03/14/2013   Td 10/09/2005   Zoster,  Live 03/19/2009    Past Medical History:  Diagnosis Date   Allergic rhinitis    Arthritis    Asthma    Atrial fibrillation (HCC)    sees Dr. Bedelia Person at [facility]    Atrial flutter (HCC)    BCC (basal cell carcinoma of skin)    Nose   Chronic kidney disease    Complication of anesthesia    hx of one time with problem intubation years ago   COPD with emphysema (HCC)    sees Dr. Coralyn Helling    Diverticulosis    Dysrhythmia    afib, flutter and tachycardia   Erosive esophagitis    Esophageal stricture    Gastric ulcer    Hearing loss    uses amplification   Hemorrhoids    Hiatal hernia    History of kidney stones    HTN (hypertension)    Hyperlipidemia    Knee problem    2% permanent partial impairment Right   Macular degeneration    Perineal abscess    Pneumonia    Pre-diabetes    Presence of permanent cardiac pacemaker    Sleep apnea    cpap    Tobacco History: Social History   Tobacco Use  Smoking Status Former   Packs/day: 1.00   Years: 28.00   Additional pack years: 0.00   Total pack years: 28.00   Types: Cigarettes   Quit date: 10/12/1977   Years since quitting: 45.4  Smokeless Tobacco Never   Counseling given: Not Answered   Outpatient Medications Prior to Visit  Medication Sig Dispense Refill   albuterol (VENTOLIN HFA) 108 (90 Base) MCG/ACT  inhaler USE 2 INHALATIONS EVERY 6 HOURS AS NEEDED FOR WHEEZING OR SHORTNESS OF BREATH 54 g 3   amLODipine (NORVASC) 5 MG tablet TAKE 1 TABLET DAILY 90 tablet 3   BESIVANCE 0.6 % SUSP Place 1 drop into both eyes See admin instructions. Instill 1 drop into both eyes 3 times daily on the day of monthly eye injections then 4 times daily the day after     Carboxymethylcellul-Glycerin (LUBRICATING EYE DROPS OP) Place 1 drop into both eyes daily as needed (dry eyes).     cyanocobalamin (VITAMIN B12) 1000 MCG/ML injection Inject 1 mL (1,000 mcg total) into the muscle every 30 (thirty) days. 1 mL 0    fluticasone (FLONASE) 50 MCG/ACT nasal spray Place 1 spray into both nostrils daily. 16 g 2   fluticasone-salmeterol (ADVAIR) 250-50 MCG/ACT AEPB Inhale 1 puff into the lungs in the morning and at bedtime. 60 each 5   hydrocortisone cream 1 % Apply 1 Application topically daily as needed for itching.     MAGNESIUM GLUCONATE PO Take 1 tablet by mouth daily.     Methylcellulose, Laxative, (CITRUCEL PO) Take 1-2 tablets by mouth daily as needed (constipation).     Multiple Vitamins-Minerals (PRESERVISION AREDS 2) CAPS Take 1 capsule by mouth 2 (two) times daily.     neomycin-bacitracin-polymyxin (NEOSPORIN) OINT Apply 1 Application topically as needed for wound care.     potassium chloride SA (KLOR-CON M) 20 MEQ tablet TAKE 1 TABLET DAILY 90 tablet 3   pravastatin (PRAVACHOL) 40 MG tablet TAKE 1 TABLET AT BEDTIME (Patient taking differently: Take 40 mg by mouth daily.) 90 tablet 3   rivaroxaban (XARELTO) 20 MG TABS tablet TAKE 1 TABLET DAILY 90 tablet 0   tamsulosin (FLOMAX) 0.4 MG CAPS capsule Take 1 capsule (0.4 mg total) by mouth daily after supper. 30 capsule 0   valsartan (DIOVAN) 160 MG tablet TAKE 1 TABLET DAILY 90 tablet 3   docusate sodium (COLACE) 100 MG capsule Take 1 capsule (100 mg total) by mouth daily as needed for up to 30 doses. 30 capsule 0   morphine (MSIR) 15 MG tablet Take 0.5 tablets (7.5 mg total) by mouth every 4 (four) hours as needed for severe pain. 5 tablet 0   omeprazole (PRILOSEC) 40 MG capsule Take 1 capsule (40 mg total) by mouth daily. (Patient taking differently: Take 40 mg by mouth daily as needed (Acid reflux).) 90 capsule 0   ondansetron (ZOFRAN-ODT) 4 MG disintegrating tablet Take 1 tablet (4 mg total) by mouth every 4 (four) hours as needed for nausea/vomiting. 20 tablet 0   oxyCODONE-acetaminophen (PERCOCET) 5-325 MG tablet Take 1 tablet by mouth every 4 (four) hours as needed for up to 18 doses for severe pain. 18 tablet 0   oxyCODONE-acetaminophen (PERCOCET)  5-325 MG tablet Take 1 tablet by mouth every 4 (four) hours as needed for up to 18 doses for severe pain. 18 tablet 0   No facility-administered medications prior to visit.    Review of Systems  Review of Systems  Constitutional: Negative.   HENT: Negative.    Respiratory: Negative.    Cardiovascular: Negative.      Physical Exam  BP 112/74 (BP Location: Right Arm, Patient Position: Sitting, Cuff Size: Large)   Pulse 96   Temp 97.8 F (36.6 C) (Oral)   Ht 5\' 10"  (1.778 m)   Wt 229 lb (103.9 kg)   SpO2 96%   BMI 32.86 kg/m  Physical Exam Constitutional:  Appearance: Normal appearance.  HENT:     Head: Normocephalic and atraumatic.  Cardiovascular:     Rate and Rhythm: Normal rate.  Pulmonary:     Effort: Pulmonary effort is normal.     Breath sounds: Normal breath sounds.  Musculoskeletal:        General: Normal range of motion.  Skin:    General: Skin is warm and dry.  Neurological:     General: No focal deficit present.     Mental Status: He is alert and oriented to person, place, and time. Mental status is at baseline.  Psychiatric:        Mood and Affect: Mood normal.        Behavior: Behavior normal.        Thought Content: Thought content normal.        Judgment: Judgment normal.      Lab Results:  CBC    Component Value Date/Time   WBC 2.9 (L) 01/28/2023 1322   RBC 4.46 01/28/2023 1322   HGB 13.0 01/28/2023 1322   HGB 13.5 08/31/2022 1457   HCT 40.3 01/28/2023 1322   PLT 118 (L) 01/28/2023 1322   PLT 108 (L) 08/31/2022 1457   MCV 90.4 01/28/2023 1322   MCH 29.1 01/28/2023 1322   MCHC 32.3 01/28/2023 1322   RDW 14.8 01/28/2023 1322   LYMPHSABS 0.4 (L) 11/04/2022 0853   MONOABS 0.5 11/04/2022 0853   EOSABS 0.0 11/04/2022 0853   BASOSABS 0.0 11/04/2022 0853    BMET    Component Value Date/Time   NA 135 01/28/2023 1322   NA 141 06/27/2015 0000   K 4.4 01/28/2023 1322   CL 103 01/28/2023 1322   CO2 25 01/28/2023 1322   GLUCOSE 96  01/28/2023 1322   BUN 21 01/28/2023 1322   BUN 21 06/27/2015 0000   CREATININE 1.09 01/28/2023 1322   CREATININE 1.08 08/31/2022 1457   CALCIUM 8.8 (L) 01/28/2023 1322   GFRNONAA >60 01/28/2023 1322   GFRNONAA >60 08/31/2022 1457   GFRAA >60 02/26/2020 1329    BNP    Component Value Date/Time   BNP 161.2 (H) 05/25/2021 0258    ProBNP No results found for: "PROBNP"  Imaging: CUP PACEART INCLINIC DEVICE CHECK  Result Date: 02/24/2023 Pacemaker check in clinic. Normal device function. Thresholds, sensing, impedances consistent with previous measurements. Device programmed to maximize longevity. Permanent AF. "NSVT" previously noted could be true vs brief AF/RVR. Device programmed at appropriate safety margins. Histogram distribution appropriate for patient activity level. Estimated longevity 39yr68mo. Patient enrolled in remote follow-up. Patient education completed.  CUP PACEART REMOTE DEVICE CHECK  Result Date: 02/15/2023 Scheduled remote reviewed. Normal device function.  1 VHR episode recorded of 14 beats duration.  EGM is c/w NSVT at 188 bpm. Next remote 91 days. MC, CVRS.    Assessment & Plan:   OBSTRUCTIVE SLEEP APNEA - Well controlled; Patient is compliant with CPAP use - Continue CPAP nightly 4-6 hours or longer  COPD with asthma (HCC) - Treated for AECOPD, acute symptoms improved - Completed prednisone taper and changed from Flovent to Advair  - Continue Advair one puff morning and evening (rinse mouth after use) - Ok from pulmonary standpoint to proceed with TURP procedure   Recommendation: - Try AYR over the counter nasal gel  - Continue fluticasone nasal spray once daily (after allergy season can use as needed only) - Continue Advair one puff morning and evening (rinse mouth after use) - Continue to wear CPAP  nightly  - Ok to reschedule TURP procedure   Follow-up: 6 months with Dr. Craige Cotta or sooner if needed    Glenford Bayley, NP 03/01/2023

## 2023-02-19 ENCOUNTER — Other Ambulatory Visit: Payer: Self-pay | Admitting: Family Medicine

## 2023-02-19 DIAGNOSIS — K219 Gastro-esophageal reflux disease without esophagitis: Secondary | ICD-10-CM

## 2023-02-22 ENCOUNTER — Encounter (INDEPENDENT_AMBULATORY_CARE_PROVIDER_SITE_OTHER): Payer: Medicare Other | Admitting: Ophthalmology

## 2023-02-22 DIAGNOSIS — D3131 Benign neoplasm of right choroid: Secondary | ICD-10-CM

## 2023-02-22 DIAGNOSIS — H35033 Hypertensive retinopathy, bilateral: Secondary | ICD-10-CM | POA: Diagnosis not present

## 2023-02-22 DIAGNOSIS — H353231 Exudative age-related macular degeneration, bilateral, with active choroidal neovascularization: Secondary | ICD-10-CM

## 2023-02-22 DIAGNOSIS — H43813 Vitreous degeneration, bilateral: Secondary | ICD-10-CM

## 2023-02-22 DIAGNOSIS — I10 Essential (primary) hypertension: Secondary | ICD-10-CM

## 2023-02-24 ENCOUNTER — Encounter: Payer: Self-pay | Admitting: Student

## 2023-02-24 ENCOUNTER — Ambulatory Visit: Payer: Medicare Other | Attending: Student | Admitting: Student

## 2023-02-24 VITALS — BP 148/84 | HR 86 | Ht 70.0 in | Wt 230.4 lb

## 2023-02-24 DIAGNOSIS — I10 Essential (primary) hypertension: Secondary | ICD-10-CM | POA: Diagnosis not present

## 2023-02-24 DIAGNOSIS — I495 Sick sinus syndrome: Secondary | ICD-10-CM

## 2023-02-24 DIAGNOSIS — I4819 Other persistent atrial fibrillation: Secondary | ICD-10-CM | POA: Diagnosis not present

## 2023-02-24 LAB — CUP PACEART INCLINIC DEVICE CHECK
Battery Remaining Longevity: 91 mo
Battery Voltage: 2.98 V
Brady Statistic AP VP Percent: 0 %
Brady Statistic AP VS Percent: 0 %
Brady Statistic AS VP Percent: 87.59 %
Brady Statistic AS VS Percent: 12.41 %
Brady Statistic RA Percent Paced: 0 %
Brady Statistic RV Percent Paced: 87.59 %
Date Time Interrogation Session: 20240515112933
Implantable Lead Connection Status: 753985
Implantable Lead Connection Status: 753985
Implantable Lead Implant Date: 20180124
Implantable Lead Implant Date: 20180124
Implantable Lead Location: 753859
Implantable Lead Location: 753860
Implantable Lead Model: 5076
Implantable Lead Model: 5076
Implantable Pulse Generator Implant Date: 20180124
Lead Channel Impedance Value: 323 Ohm
Lead Channel Impedance Value: 342 Ohm
Lead Channel Impedance Value: 418 Ohm
Lead Channel Impedance Value: 456 Ohm
Lead Channel Pacing Threshold Amplitude: 0.75 V
Lead Channel Pacing Threshold Pulse Width: 0.4 ms
Lead Channel Sensing Intrinsic Amplitude: 0.25 mV
Lead Channel Sensing Intrinsic Amplitude: 0.25 mV
Lead Channel Sensing Intrinsic Amplitude: 5.875 mV
Lead Channel Sensing Intrinsic Amplitude: 6.125 mV
Lead Channel Setting Pacing Amplitude: 1.75 V
Lead Channel Setting Pacing Pulse Width: 0.4 ms
Lead Channel Setting Sensing Sensitivity: 0.9 mV
Zone Setting Status: 755011

## 2023-02-24 NOTE — Progress Notes (Signed)
  Electrophysiology Office Note:   Date:  02/24/2023  ID:  RAEES ATALLAH, DOB [birth year], MRN [id number]  Primary Cardiologist: None Electrophysiologist: Will Jorja Loa, MD   History of Present Illness:   Guy Moore is a 89 y.o. male with h/o PAF, HTN, HLD, SSS s/p PPM, and OSA seen today for routine electrophysiology followup. Since last being seen in our clinic the patient reports doing well from a cardiac perspective.  he denies chest pain, palpitations, dyspnea, PND, orthopnea, nausea, vomiting, dizziness, syncope, edema, weight gain, or early satiety.   Review of systems complete and found to be negative unless listed in HPI.   Device History: Medtronic Dual Chamber PPM implanted 2018 for Sick sinus syndrome  Studies Reviewed:    PPM Interrogation-  reviewed in detail today,  See PACEART report.  EKG is not ordered today. EKG from 10/2022  reviewed which showed NSR at 66 bpm    Physical Exam:   VS:  BP (!) 148/84   Pulse 86   Ht 5\' 10"  (1.778 m)   Wt 230 lb 6.4 oz (104.5 kg)   SpO2 98%   BMI 33.06 kg/m    Wt Readings from Last 3 Encounters:  02/24/23 230 lb 6.4 oz (104.5 kg)  02/17/23 229 lb (103.9 kg)  02/04/23 237 lb (107.5 kg)     GEN: Well nourished, well developed in no acute distress NECK: No JVD; No carotid bruits CARDIAC: Regular rate and rhythm, no murmurs, rubs, gallops RESPIRATORY:  Clear to auscultation without rales, wheezing or rhonchi  ABDOMEN: Soft, non-tender, non-distended EXTREMITIES:  No edema; No deformity   ASSESSMENT AND PLAN:    Sick sinus syndrome s/p Medtronic PPM  Normal PPM function See Pace Art report No changes today  Permanent atrial fibrillation Rates overall well controlled S/p multiple ablations (records say up to 4, including SVT ablation) Continue Xarelto 20 mg daily for CHA2DS2VASc  of at least 4.   HTN Stable on current regimen    Disposition:   Follow up with Dr. Elberta Fortis in 12 months  Signed, Graciella Freer, PA-C

## 2023-02-24 NOTE — Patient Instructions (Signed)
Medication Instructions:  Your physician recommends that you continue on your current medications as directed. Please refer to the Current Medication list given to you today.  *If you need a refill on your cardiac medications before your next appointment, please call your pharmacy*  Lab Work: None ordered If you have labs (blood work) drawn today and your tests are completely normal, you will receive your results only by: MyChart Message (if you have MyChart) OR A paper copy in the mail If you have any lab test that is abnormal or we need to change your treatment, we will call you to review the results.  Follow-Up: At [facility], you and your health needs are our priority.  As part of our continuing mission to provide you with exceptional heart care, we have created designated Provider Care Teams.  These Care Teams include your primary Cardiologist (physician) and Advanced Practice Providers (APPs -  Physician Assistants and Nurse Practitioners) who all work together to provide you with the care you need, when you need it.  Your next appointment:   1 year(s)  Provider:   Will Camnitz, MD  

## 2023-03-01 ENCOUNTER — Inpatient Hospital Stay (HOSPITAL_BASED_OUTPATIENT_CLINIC_OR_DEPARTMENT_OTHER): Payer: Medicare Other | Admitting: Internal Medicine

## 2023-03-01 ENCOUNTER — Other Ambulatory Visit: Payer: Self-pay | Admitting: Urology

## 2023-03-01 ENCOUNTER — Inpatient Hospital Stay: Payer: Medicare Other | Attending: Internal Medicine

## 2023-03-01 VITALS — BP 153/85 | HR 84 | Temp 97.9°F | Resp 16 | Wt 230.4 lb

## 2023-03-01 DIAGNOSIS — Z87440 Personal history of urinary (tract) infections: Secondary | ICD-10-CM | POA: Diagnosis not present

## 2023-03-01 DIAGNOSIS — D72819 Decreased white blood cell count, unspecified: Secondary | ICD-10-CM | POA: Diagnosis not present

## 2023-03-01 DIAGNOSIS — D696 Thrombocytopenia, unspecified: Secondary | ICD-10-CM | POA: Diagnosis not present

## 2023-03-01 LAB — CBC WITH DIFFERENTIAL (CANCER CENTER ONLY)
Abs Immature Granulocytes: 0 10*3/uL (ref 0.00–0.07)
Basophils Absolute: 0 10*3/uL (ref 0.0–0.1)
Basophils Relative: 2 %
Eosinophils Absolute: 0.1 10*3/uL (ref 0.0–0.5)
Eosinophils Relative: 5 %
HCT: 40.7 % (ref 39.0–52.0)
Hemoglobin: 13.3 g/dL (ref 13.0–17.0)
Immature Granulocytes: 0 %
Lymphocytes Relative: 19 %
Lymphs Abs: 0.5 10*3/uL — ABNORMAL LOW (ref 0.7–4.0)
MCH: 28.9 pg (ref 26.0–34.0)
MCHC: 32.7 g/dL (ref 30.0–36.0)
MCV: 88.3 fL (ref 80.0–100.0)
Monocytes Absolute: 0.2 10*3/uL (ref 0.1–1.0)
Monocytes Relative: 8 %
Neutro Abs: 1.8 10*3/uL (ref 1.7–7.7)
Neutrophils Relative %: 66 %
Platelet Count: 108 10*3/uL — ABNORMAL LOW (ref 150–400)
RBC: 4.61 MIL/uL (ref 4.22–5.81)
RDW: 15.3 % (ref 11.5–15.5)
WBC Count: 2.7 10*3/uL — ABNORMAL LOW (ref 4.0–10.5)
nRBC: 0 % (ref 0.0–0.2)

## 2023-03-01 LAB — CMP (CANCER CENTER ONLY)
ALT: 17 U/L (ref 0–44)
AST: 18 U/L (ref 15–41)
Albumin: 4.1 g/dL (ref 3.5–5.0)
Alkaline Phosphatase: 56 U/L (ref 38–126)
Anion gap: 9 (ref 5–15)
BUN: 18 mg/dL (ref 8–23)
CO2: 26 mmol/L (ref 22–32)
Calcium: 8.9 mg/dL (ref 8.9–10.3)
Chloride: 104 mmol/L (ref 98–111)
Creatinine: 0.99 mg/dL (ref 0.61–1.24)
GFR, Estimated: >60 mL/min (ref >60)
Glucose, Bld: 98 mg/dL (ref 70–99)
Potassium: 3.9 mmol/L (ref 3.5–5.1)
Sodium: 139 mmol/L (ref 135–145)
Total Bilirubin: 0.6 mg/dL (ref 0.3–1.2)
Total Protein: 6.4 g/dL — ABNORMAL LOW (ref 6.5–8.1)

## 2023-03-01 LAB — LACTATE DEHYDROGENASE: LDH: 115 U/L (ref 98–192)

## 2023-03-01 NOTE — Assessment & Plan Note (Signed)
-   Well controlled; Patient is compliant with CPAP use - Continue CPAP nightly 4-6 hours or longer

## 2023-03-01 NOTE — Progress Notes (Signed)
[facility] Telephone:(336) [phone]   Fax:([phone]  OFFICE PROGRESS NOTE  Nelwyn Salisbury, MD [address] 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   PRIOR THERAPY: None   CURRENT THERAPY:  Vitamin B12 injection on a monthly basis  INTERVAL HISTORY: Guy Moore 89 y.o. male is to the clinic today for follow-up visit.  The patient is feeling fine today with no concerning complaints.  He had several issues with urinary tract infection as well as kidney stone recently.  He is scheduled for surgical removal of some of these kidney stones soon.  He denied having any current chest pain, shortness of breath except with exertion with no cough or hemoptysis.  He has no nausea, vomiting, diarrhea or constipation.  He has no headache or visual changes.  He is here today for evaluation and repeat blood work. MEDICAL HISTORY: Past Medical History:  Diagnosis Date   Allergic rhinitis    Arthritis    Asthma    Atrial fibrillation [facility])    sees Dr. Bedelia Person at [facility]    Atrial flutter (HCC)    BCC (basal cell carcinoma of skin)    Nose   Chronic kidney disease    Complication of anesthesia    hx of one time with problem intubation years ago   COPD with emphysema (HCC)    sees Dr. Coralyn Helling    Diverticulosis    Dysrhythmia    afib, flutter and tachycardia   Erosive esophagitis    Esophageal stricture    Gastric ulcer    Hearing loss    uses amplification   Hemorrhoids    Hiatal hernia    History of kidney stones    HTN (hypertension)    Hyperlipidemia    Knee problem    2% permanent partial impairment Right   Macular degeneration    Perineal abscess    Pneumonia    Pre-diabetes    Presence of permanent cardiac pacemaker    Sleep apnea    cpap    ALLERGIES:  has No Known Allergies.  MEDICATIONS:  Current Outpatient Medications  Medication Sig Dispense Refill    albuterol (VENTOLIN HFA) 108 (90 Base) MCG/ACT inhaler USE 2 INHALATIONS EVERY 6 HOURS AS NEEDED FOR WHEEZING OR SHORTNESS OF BREATH 54 g 3   amLODipine (NORVASC) 5 MG tablet TAKE 1 TABLET DAILY 90 tablet 3   BESIVANCE 0.6 % SUSP Place 1 drop into both eyes See admin instructions. Instill 1 drop into both eyes 3 times daily on the day of monthly eye injections then 4 times daily the day after     Carboxymethylcellul-Glycerin (LUBRICATING EYE DROPS OP) Place 1 drop into both eyes daily as needed (dry eyes).     cyanocobalamin (VITAMIN B12) 1000 MCG/ML injection Inject 1 mL (1,000 mcg total) into the muscle every 30 (thirty) days. 1 mL 0   fluticasone (FLONASE) 50 MCG/ACT nasal spray Place 1 spray into both nostrils daily. 16 g 2   fluticasone-salmeterol (ADVAIR) 250-50 MCG/ACT AEPB Inhale 1 puff into the lungs in the morning and at bedtime. 60 each 5   hydrocortisone cream 1 % Apply 1 Application topically daily as needed for itching.     MAGNESIUM GLUCONATE PO Take 1 tablet by mouth daily.     Methylcellulose, Laxative, (CITRUCEL PO) Take 1-2 tablets by mouth daily as needed (constipation).     Multiple Vitamins-Minerals (PRESERVISION AREDS  2) CAPS Take 1 capsule by mouth 2 (two) times daily.     neomycin-bacitracin-polymyxin (NEOSPORIN) OINT Apply 1 Application topically as needed for wound care.     omeprazole (PRILOSEC) 40 MG capsule TAKE 1 CAPSULE DAILY 90 capsule 3   potassium chloride SA (KLOR-CON M) 20 MEQ tablet TAKE 1 TABLET DAILY 90 tablet 3   pravastatin (PRAVACHOL) 40 MG tablet TAKE 1 TABLET AT BEDTIME (Patient taking differently: Take 40 mg by mouth daily.) 90 tablet 3   rivaroxaban (XARELTO) 20 MG TABS tablet TAKE 1 TABLET DAILY 90 tablet 0   tamsulosin (FLOMAX) 0.4 MG CAPS capsule Take 1 capsule (0.4 mg total) by mouth daily after supper. 30 capsule 0   valsartan (DIOVAN) 160 MG tablet TAKE 1 TABLET DAILY 90 tablet 3   No current facility-administered medications for this visit.     SURGICAL HISTORY:  Past Surgical History:  Procedure Laterality Date   CARDIAC ELECTROPHYSIOLOGY STUDY AND ABLATION     x 4 (sees Dr. Elmer Picker at [facility])   CYST EXCISION PERINEAL     CYSTOSCOPY WITH URETEROSCOPY AND STENT PLACEMENT Right 10/29/2022   Procedure: CYSTOSCOPY WITH RETROGRADE WITH URETHERAL DILITATIONRIGHT DIAGNOSTIC  URETEROSCOPY AND STENT PLACEMENT;  Surgeon: Jannifer Hick, MD;  Location: [facility];  Service: Urology;  Laterality: Right;   CYSTOSCOPY/URETEROSCOPY/HOLMIUM LASER/STENT PLACEMENT Right 11/27/2022   Procedure: CYSTOSCOPY/ RIGHT RETROGRADE PYELOGRAM/RIGHT HOLMIUM LASER/RIGHT STENT PLACEMENT/BASKET;  Surgeon: Jannifer Hick, MD;  Location: [facility];  Service: Urology;  Laterality: Right;  60 MINUTES NEEDED FOR CASE   INSERT / REPLACE / REMOVE PACEMAKER     NOSE SURGERY     septoplasty   PENILE BIOPSY N/A 11/27/2022   Procedure: PENILE BIOPSY;  Surgeon: Jannifer Hick, MD;  Location: [facility];  Service: Urology;  Laterality: N/A;   UVULOPALATOPHARYNGOPLASTY  06/13/1979   VASECTOMY  06/12/1969   w/complications    REVIEW OF SYSTEMS:  A comprehensive review of systems was negative.   PHYSICAL EXAMINATION: General appearance: alert, cooperative, and no distress Head: Normocephalic, without obvious abnormality, atraumatic Neck: no adenopathy, no JVD, supple, symmetrical, trachea midline, and thyroid not enlarged, symmetric, no tenderness/mass/nodules Lymph nodes: Cervical, supraclavicular, and axillary nodes normal. Resp: clear to auscultation bilaterally Back: symmetric, no curvature. ROM normal. No CVA tenderness. Cardio: regular rate and rhythm, S1, S2 normal, no murmur, click, rub or gallop GI: soft, non-tender; bowel sounds normal; no masses,  no organomegaly Extremities: extremities normal, atraumatic, no cyanosis or edema  ECOG PERFORMANCE STATUS: 1 - Symptomatic but completely ambulatory  Blood pressure (!) 153/85, pulse 84, temperature 97.9 F  (36.6 C), temperature source Oral, resp. rate 16, weight 230 lb 6.4 oz (104.5 kg), SpO2 97 %.  LABORATORY DATA: Lab Results  Component Value Date   WBC 2.9 (L) 01/28/2023   HGB 13.0 01/28/2023   HCT 40.3 01/28/2023   MCV 90.4 01/28/2023   PLT 118 (L) 01/28/2023      Chemistry      Component Value Date/Time   NA 135 01/28/2023 1322   NA 141 06/27/2015 0000   K 4.4 01/28/2023 1322   CL 103 01/28/2023 1322   CO2 25 01/28/2023 1322   BUN 21 01/28/2023 1322   BUN 21 06/27/2015 0000   CREATININE 1.09 01/28/2023 1322   CREATININE 1.08 08/31/2022 1457   GLU 121 06/27/2015 0000      Component Value Date/Time   CALCIUM 8.8 (L) 01/28/2023 1322   ALKPHOS 45 11/04/2022 0853   AST  55 (H) 11/04/2022 0853   AST 16 08/31/2022 1457   ALT 26 11/04/2022 0853   ALT 14 08/31/2022 1457   BILITOT 1.5 (H) 11/04/2022 0853   BILITOT 0.8 08/31/2022 1457       RADIOGRAPHIC STUDIES: CUP PACEART INCLINIC DEVICE CHECK  Result Date: 02/24/2023 Pacemaker check in clinic. Normal device function. Thresholds, sensing, impedances consistent with previous measurements. Device programmed to maximize longevity. Permanent AF. "NSVT" previously noted could be true vs brief AF/RVR. Device programmed at appropriate safety margins. Histogram distribution appropriate for patient activity level. Estimated longevity 20yr31mo. Patient enrolled in remote follow-up. Patient education completed.  CUP PACEART REMOTE DEVICE CHECK  Result Date: 02/15/2023 Scheduled remote reviewed. Normal device function.  1 VHR episode recorded of 14 beats duration.  EGM is c/w NSVT at 188 bpm. Next remote 91 days. MC, CVRS.   ASSESSMENT AND PLAN: This is a very pleasant 89 years old white male with bicytopenia including leukocytopenia and thrombocytopenia likely secondary to iron deficiency but early MDS could not be completely excluded. The patient had a bone marrow biopsy and aspirate performed recently that was nonspecific but again  could not rule out early myelodysplastic syndrome. The patient is currently on observation except for the vitamin B12 injection and he is tolerating it well. He is feeling fine today with no concerning complaints. Repeat CBC showed persistent mild leukocytopenia and thrombocytopenia but the patient is asymptomatic. I recommended for him to continue on observation with repeat blood work in 6 months. He was advised to call immediately if he has any other concerning symptoms in the interval. The patient voices understanding of current disease status and treatment options and is in agreement with the current care plan.  All questions were answered. The patient knows to call the clinic with any problems, questions or concerns. We can certainly see the patient much sooner if necessary.  Disclaimer: This note was dictated with voice recognition software. Similar sounding words can inadvertently be transcribed and may not be corrected upon review.

## 2023-03-01 NOTE — Assessment & Plan Note (Signed)
-   Treated for AECOPD, acute symptoms improved - Completed prednisone taper and changed from Flovent to Advair  - Continue Advair one puff morning and evening (rinse mouth after use) - Ok from pulmonary standpoint to proceed with TURP procedure

## 2023-03-03 ENCOUNTER — Ambulatory Visit (INDEPENDENT_AMBULATORY_CARE_PROVIDER_SITE_OTHER): Payer: Medicare Other

## 2023-03-03 DIAGNOSIS — E538 Deficiency of other specified B group vitamins: Secondary | ICD-10-CM | POA: Diagnosis not present

## 2023-03-03 MED ORDER — CYANOCOBALAMIN 1000 MCG/ML IJ SOLN
1000.0000 ug | Freq: Once | INTRAMUSCULAR | Status: AC
Start: 2023-03-03 — End: 2023-03-03
  Administered 2023-03-03: 1000 ug via INTRAMUSCULAR

## 2023-03-03 NOTE — Progress Notes (Signed)
Per orders of Dr. Fry, injection of Cyanocobalamin 1000 mcg given by Lesha Jager L Kiera Hussey. °Patient tolerated injection well.  °

## 2023-03-04 DIAGNOSIS — D074 Carcinoma in situ of penis: Secondary | ICD-10-CM | POA: Diagnosis not present

## 2023-03-04 DIAGNOSIS — N401 Enlarged prostate with lower urinary tract symptoms: Secondary | ICD-10-CM | POA: Diagnosis not present

## 2023-03-04 DIAGNOSIS — R338 Other retention of urine: Secondary | ICD-10-CM | POA: Diagnosis not present

## 2023-03-09 ENCOUNTER — Telehealth: Payer: Self-pay | Admitting: Student

## 2023-03-09 ENCOUNTER — Telehealth: Payer: Self-pay | Admitting: Family Medicine

## 2023-03-09 NOTE — Telephone Encounter (Signed)
.    Information was forwarded to preop callback team to obtain formal clearance request.  Robin Searing, NP

## 2023-03-09 NOTE — Telephone Encounter (Signed)
Spoke with pt stated that he was confused on how long he should hold Xarelto prior to his surgery, pt explained that he had an appointment with his Cardiologist on 02/24/23 who advised to hold Xarelto for 2 days but  Dr Clent Ridges advised to hold for 5 days, pt was advised to call his cardiologist office for advise since Dr Clent Ridges was out of the office until 03/15/23. Pt verbalized understanding

## 2023-03-09 NOTE — Telephone Encounter (Addendum)
Pt is scheduled for surgery on March 26, 2023.  Pt wants clarification on how many days before the surgery to stop the Xarelto (rivaroxaban (XARELTO) 20 MG TABS tablet) ?  Pt states that he is getting mixed messages from various providers and is very confused.  Pt informed MD is OOO.    Pt is asking for a call back, as soon as possible.  Please advise.

## 2023-03-09 NOTE — Telephone Encounter (Signed)
Left pt detailed message regarding his questions on when to hold Xarelto prior to suirgery, advised pt to call the office  back

## 2023-03-09 NOTE — Telephone Encounter (Signed)
I s/w the pt and informed him that we still have not received a clearance. I asked who was doing the procedure. Pt tells me that Dr. Jettie Pagan with [facility] for procedure set for 03-26-23. I assured the pt that I will call Dr. Dillard Essex office  and have them fax clearance request to me today [phone] fax#. I will also fax these notes as FYI to Dr. Cardell Peach that we need a clearance request faxed to cardiology. We cannot provide final clearance recommendations until we have the request for proper documentation.

## 2023-03-09 NOTE — Telephone Encounter (Signed)
Pt states he is due to have surgery next month with Urology. He states his PCP told him okay to hold his xarelto for 5 days but then he stated T[place], PA told him it would be okay to hold for 2-3 days.   He would like D[place], PA to clarify how long should he hold it

## 2023-03-10 ENCOUNTER — Telehealth: Payer: Self-pay | Admitting: Cardiology

## 2023-03-10 ENCOUNTER — Encounter (HOSPITAL_COMMUNITY): Payer: Self-pay

## 2023-03-10 NOTE — Telephone Encounter (Signed)
   Pre-operative Risk Assessment    Patient Name: Guy Moore  DOB: 07/14/1934 MRN: [id number]      Request for Surgical Clearance    Procedure: BIPOLAR TRANSURETHRAL RESECTION OF THE PROSTATE. C02 LASER ABLATION OF THE PENIS  Date of Surgery:  Clearance 03/26/23                                 Surgeon:  DR. MATTHEW GAY Surgeon's Group or Practice Name:  [facility] Phone number:  [phone] Fax number:  [phone]   Type of Clearance Requested:   - Medical  - Pharmacy:  Hold Rivaroxaban (Xarelto)     Type of Anesthesia:  GENERAL   Additional requests/questions:    Elpidio Anis   03/10/2023, 3:06 PM

## 2023-03-10 NOTE — Telephone Encounter (Signed)
   Patient Name: Guy Moore  DOB: November 14, 1933 MRN: [id number]  Primary Cardiologist: None  Chart reviewed as part of pre-operative protocol coverage. Given past medical history and time since last visit, based on ACC/AHA guidelines, SALVADOR MONTEIRO is at acceptable risk for the planned procedure without further cardiovascular testing.  Patient was seen recently by Otilio Saber, PA and was found to be at appropriate risk to proceed with scheduled procedure.  The patient was advised that if he develops new symptoms prior to surgery to contact our office to arrange for a follow-up visit, and he verbalized understanding.  Per office protocol, patient can hold Xarelto for 3 days prior to procedure.    I will route this recommendation to the requesting party via Epic fax function and remove from pre-op pool.  Please call with questions.  Napoleon Form, Leodis Rains, NP 03/10/2023, 12:33 PM

## 2023-03-10 NOTE — Telephone Encounter (Signed)
See previous notes. I left message asking surgery schedule to please fax over the clearance request today as we will need to review in time for pre op clearance. This is my 2nd call to surgery scheduler as I left a message yesterday as well. Left fax# [phone] attn: pre op team.

## 2023-03-10 NOTE — Telephone Encounter (Signed)
   [facility] Pre-operative Risk Assessment    Request for surgical clearance:  What type of surgery is being performed?  Bipolar Transurethral Resection of The Prostate & CO2 Laser Ablation of The Penis  When is this surgery scheduled?  03/26/23   What type of clearance is required (medical clearance vs. Pharmacy clearance to hold med vs. Both)?  Both   Are there any medications that need to be held prior to surgery and how long? Xarelto, 3 days prior    Practice name and name of physician performing surgery?  [facility]  Dr. Tillie Rung    What is your office phone number? [phone] (ext#: [phone])    7.   What is your office fax number? [phone]  8.   Anesthesia type (None, local, MAC, general) ?  General    Rolly Pancake 03/10/2023, 11:32 AM

## 2023-03-10 NOTE — Telephone Encounter (Signed)
   Patient Name: Guy Moore  DOB: 11-Sep-1934 MRN: [id number]  Primary Cardiologist: None  Chart reviewed as part of pre-operative protocol coverage. Given past medical history and time since last visit, based on ACC/AHA guidelines, KAIQUE CARTWRIGHT is at acceptable risk for the planned procedure without further cardiovascular testing.   The patient was advised that if he develops new symptoms prior to surgery to contact our office to arrange for a follow-up visit, and he verbalized understanding.   Per office protocol, patient can hold Xarelto for 3 days prior to procedure.    I will route this recommendation to the requesting party via Epic fax function and remove from pre-op pool.  Please call with questions.  Napoleon Form, Leodis Rains, NP 03/10/2023, 3:24 PM

## 2023-03-10 NOTE — Telephone Encounter (Signed)
Patient with diagnosis of A Fib on Xarelto for anticoagulation.    Procedure: Bipolar Transurethral Resection of The Prostate & CO2 Laser Ablation of The Penis  Date of procedure: 03/26/23   CHA2DS2-VASc Score = 4  This indicates a 4.8% annual risk of stroke. The patient's score is based upon: CHF History: 0 HTN History: 1 Diabetes History: 1 Stroke History: 0 Vascular Disease History: 0 Age Score: 2 Gender Score: 0   CrCl 78 mL/min Platelet count 108K  Per office protocol, patient can hold Xarelto for 3 days prior to procedure.   .  **This guidance is not considered finalized until pre-operative APP has relayed final recommendations.**

## 2023-03-10 NOTE — Telephone Encounter (Signed)
Pharmacy please advise on holding Xarelto prior to bipolar TURP scheduled for 03/26/2023. Thank you.

## 2023-03-11 ENCOUNTER — Other Ambulatory Visit: Payer: Self-pay | Admitting: Family Medicine

## 2023-03-14 NOTE — Progress Notes (Signed)
COVID Vaccine received:  []  No [x]  Yes Date of any COVID positive Test in last 90 days:  PCP -  Dr. Gershon Crane  Cardiologist - Loman Brooklyn, MD Hematology- Si Gaul, MD   Chest x-ray -   CT chest w/ contrast 05-25-2021  Epic EKG - 11-04-2022   Epic  Stress Test -  ECHO -  Cardiac Cath -   PCR screen: []  Ordered & Completed           []   No Order but Needs PROFEND           [x]   N/A for this surgery  Surgery Plan:  []  Ambulatory                            [x]  Outpatient in bed                            []  Admit  Anesthesia:    [x]  General  []  Spinal                           []   Choice []   MAC  Bowel Prep - [x]  No  []   Yes ______  Pacemaker / ICD device []  No [x]  Yes    MERM [id number] Azure XT DR Last checked: 02-24-2023 at [facility].  Requested new Device orders via IB msg.   Spinal Cord Stimulator:[x]  No []  Yes       History of Sleep Apnea? []  No [x]  Yes   CPAP used?- []  No [x]  Yes    Does the patient monitor blood sugar?          [x]  No []  Yes  []  N/A Last A1c: 01-28-2023? 5.8 Patient has: []  NO Hx DM   [x]  Pre-DM                 []  DM1  []   DM2  Blood Thinner / Instructions:  Xarelto   Hold x 3 days per Dr. Elberta Fortis / Robin Searing, NP Aspirin Instructions: none  ERAS Protocol Ordered: [x]  No  []  Yes Patient is to be NPO after:   Comments:   Activity level: Patient is able / unable to climb a flight of stairs without difficulty; []  No CP  []  No SOB, but would have ___   Patient can / can not perform ADLs without assistance.   Anesthesia review: SSS-s/p PPM, HOH- HAs, Bicytopenia, A.fib, OSA-CPAP, asthma-COPD, CKD2, Macular Degeneration, Pre-DM, HTN, GERD.  Patient denies shortness of breath, fever, cough and chest pain at PAT appointment.  Patient verbalized understanding and agreement to the Pre-Surgical Instructions that were given to them at this PAT appointment. Patient was also educated of the need to review these PAT instructions again prior to  his/her surgery.I reviewed the appropriate phone numbers to call if they have any and questions or concerns.

## 2023-03-14 NOTE — Patient Instructions (Signed)
SURGICAL WAITING ROOM VISITATION Patients having surgery or a procedure may have no more than 2 support people in the waiting area - these visitors may rotate in the visitor waiting room.   Due to an increase in RSV and influenza rates and associated hospitalizations, children ages 34 and under may not visit patients in [facility] hospitals. If the patient needs to stay at the hospital during part of their recovery, the visitor guidelines for inpatient rooms apply.  PRE-OP VISITATION  Pre-op nurse will coordinate an appropriate time for 1 support person to accompany the patient in pre-op.  This support person may not rotate.  This visitor will be contacted when the time is appropriate for the visitor to come back in the pre-op area.  Please refer to the [facility] website for the visitor guidelines for Inpatients (after your surgery is over and you are in a regular room).  You are not required to quarantine at this time prior to your surgery. However, you must do this: Hand Hygiene often Do NOT share personal items Notify your provider if you are in close contact with someone who has COVID or you develop fever 100.4 or greater, new onset of sneezing, cough, sore throat, shortness of breath or body aches.  If you test positive for Covid or have been in contact with anyone that has tested positive in the last 10 days please notify you surgeon.    Your procedure is scheduled on:  Friday  June 14. 2024  Report to [facility] Main Entrance: [place] entrance where the [employer] is available.   Report to admitting at:  06:45   AM  +++++Call this number if you have any questions or problems the morning of surgery [phone]  DO NOT EAT OR DRINK ANYTHING AFTER MIDNIGHT THE NIGHT PRIOR TO YOUR SURGERY / PROCEDURE.   FOLLOW BOWEL PREP AND ANY ADDITIONAL PRE OP INSTRUCTIONS YOU RECEIVED FROM YOUR SURGEON'S OFFICE!!!   Oral Hygiene is also important to reduce your risk of infection.         Remember - BRUSH YOUR TEETH THE MORNING OF SURGERY WITH YOUR REGULAR TOOTHPASTE  Do NOT smoke after Midnight the night before surgery.  Take ONLY these medicines the morning of surgery with A SIP OF WATER:amlodipine, Omeprazole.  You may use your Flonase Nasal spray, Eye drops and your Advair / Albuterol inhalers.  Pleas bring your Albuterol Inhaler with you on your surgery day: 03-26-2023   If You have been diagnosed with Sleep Apnea - Bring CPAP mask and tubing day of surgery. We will provide you with a CPAP machine on the day of your surgery.                   You may not have any metal on your body including jewelry, and body piercing  Do not wear  lotions, powders,  cologne, or deodorant    Men may shave face and neck.  Contacts, Hearing Aids, dentures or bridgework may not be worn into surgery. DENTURES WILL BE REMOVED PRIOR TO SURGERY PLEASE DO NOT APPLY "Poly grip" OR ADHESIVES!!!  You may bring a small overnight bag with you on the day of surgery, only pack items that are not valuable. [facility] IS NOT RESPONSIBLE   FOR VALUABLES THAT ARE LOST OR STOLEN.   Do not bring your home medications to the hospital. The Pharmacy will dispense medications listed on your medication list to you during your admission in the Hospital.  Special Instructions: Bring a copy of your healthcare power of attorney and living will documents the day of surgery, if you wish to have them scanned into your [facility] Records- EPIC  Please read over the following fact sheets you were given: IF YOU HAVE QUESTIONS ABOUT YOUR PRE-OP INSTRUCTIONS, PLEASE CALL [phone].   [facility] - Preparing for Surgery Before surgery, you can play an important role.  Because skin is not sterile, your skin needs to be as free of germs as possible.  You can reduce the number of germs on your skin by washing with CHG (chlorahexidine gluconate) soap before surgery.  CHG is an antiseptic cleaner which kills  germs and bonds with the skin to continue killing germs even after washing. Please DO NOT use if you have an allergy to CHG or antibacterial soaps.  If your skin becomes reddened/irritated stop using the CHG and inform your nurse when you arrive at Short Stay. Do not shave (including legs and underarms) for at least 48 hours prior to the first CHG shower.  You may shave your face/neck.  Please follow these instructions carefully:  1.  Shower with CHG Soap the night before surgery and the  morning of surgery.  2.  If you choose to wash your hair, wash your hair first as usual with your normal  shampoo.  3.  After you shampoo, rinse your hair and body thoroughly to remove the shampoo.                             4.  Use CHG as you would any other liquid soap.  You can apply chg directly to the skin and wash.  Gently with a scrungie or clean washcloth.  5.  Apply the CHG Soap to your body ONLY FROM THE NECK DOWN.   Do not use on face/ open                           Wound or open sores. Avoid contact with eyes, ears mouth and genitals (private parts).                       Wash face,  Genitals (private parts) with your normal soap.             6.  Wash thoroughly, paying special attention to the area where your  surgery  will be performed.  7.  Thoroughly rinse your body with warm water from the neck down.  8.  DO NOT shower/wash with your normal soap after using and rinsing off the CHG Soap.            9.  Pat yourself dry with a clean towel.            10.  Wear clean pajamas.            11.  Place clean sheets on your bed the night of your first shower and do not  sleep with pets.  ON THE DAY OF SURGERY : Do not apply any lotions/deodorants the morning of surgery.  Please wear clean clothes to the hospital/surgery center.    FAILURE TO FOLLOW THESE INSTRUCTIONS MAY RESULT IN THE CANCELLATION OF YOUR SURGERY  PATIENT SIGNATURE_________________________________  NURSE  SIGNATURE__________________________________  ________________________________________________________________________

## 2023-03-15 ENCOUNTER — Encounter: Payer: Self-pay | Admitting: Cardiology

## 2023-03-15 NOTE — Progress Notes (Signed)
PERIOPERATIVE PRESCRIPTION FOR IMPLANTED CARDIAC DEVICE PROGRAMMING  Patient Information: Name:  Guy Moore  DOB:  10-Mar-1934  MRN:  [id number]    Planned Procedure:  Bipolar TURP and Penile Laser Ablation  Surgeon: Dr. Cardell Peach  Date of Procedure: 03-26-2023  (case was rescheduled from 02-08-2023)   Last Device check: 02-24-2023   MERM [id number] Azure XT DR MRI   Cautery will be used.  Position during surgery: Supine   Please send documentation back to:  [facility] (Fax# [phone]) or Respond to the IB message.  Let me know if the Company Rep. Needs to be contacted.   Thanks,   Device Information:  Clinic EP Physician:  Loman Brooklyn, MD   Device Type:  Pacemaker Manufacturer and Phone #:  Medtronic: [phone] Pacemaker Dependent?:  No. Date of Last Device Check:  02/24/2023 Normal Device Function?:  Yes.    Electrophysiologist's Recommendations:  Have magnet available. Provide continuous ECG monitoring when magnet is used or reprogramming is to be performed.  Procedure should not interfere with device function.  No device programming or magnet placement needed.  Per Device Clinic Standing Orders, Lenor Coffin, RN  3:41 PM 03/15/2023

## 2023-03-16 ENCOUNTER — Encounter (HOSPITAL_COMMUNITY)
Admission: RE | Admit: 2023-03-16 | Discharge: 2023-03-16 | Disposition: A | Discharge status: Home / Self Care | Payer: Medicare Other | Source: Ambulatory Visit | Attending: Urology | Admitting: Urology

## 2023-03-16 ENCOUNTER — Encounter: Payer: Self-pay | Admitting: Internal Medicine

## 2023-03-16 ENCOUNTER — Encounter (HOSPITAL_COMMUNITY): Payer: Self-pay

## 2023-03-16 ENCOUNTER — Other Ambulatory Visit: Payer: Self-pay

## 2023-03-16 VITALS — BP 140/78 | HR 74 | Temp 98.0°F | Resp 16 | Ht 70.0 in | Wt 227.0 lb

## 2023-03-16 DIAGNOSIS — Z950 Presence of cardiac pacemaker: Secondary | ICD-10-CM | POA: Insufficient documentation

## 2023-03-16 DIAGNOSIS — I272 Pulmonary hypertension, unspecified: Secondary | ICD-10-CM | POA: Diagnosis not present

## 2023-03-16 DIAGNOSIS — G4730 Sleep apnea, unspecified: Secondary | ICD-10-CM | POA: Insufficient documentation

## 2023-03-16 DIAGNOSIS — I4891 Unspecified atrial fibrillation: Secondary | ICD-10-CM | POA: Insufficient documentation

## 2023-03-16 DIAGNOSIS — I495 Sick sinus syndrome: Secondary | ICD-10-CM | POA: Diagnosis not present

## 2023-03-16 DIAGNOSIS — N182 Chronic kidney disease, stage 2 (mild): Secondary | ICD-10-CM | POA: Insufficient documentation

## 2023-03-16 DIAGNOSIS — I129 Hypertensive chronic kidney disease with stage 1 through stage 4 chronic kidney disease, or unspecified chronic kidney disease: Secondary | ICD-10-CM | POA: Diagnosis not present

## 2023-03-16 DIAGNOSIS — R7303 Prediabetes: Secondary | ICD-10-CM

## 2023-03-16 DIAGNOSIS — N4231 Prostatic intraepithelial neoplasia: Secondary | ICD-10-CM | POA: Insufficient documentation

## 2023-03-16 DIAGNOSIS — Z01812 Encounter for preprocedural laboratory examination: Secondary | ICD-10-CM | POA: Diagnosis not present

## 2023-03-16 DIAGNOSIS — I10 Essential (primary) hypertension: Secondary | ICD-10-CM

## 2023-03-16 LAB — BASIC METABOLIC PANEL
Anion gap: 10 (ref 5–15)
BUN: 24 mg/dL — ABNORMAL HIGH (ref 8–23)
CO2: 25 mmol/L (ref 22–32)
Calcium: 8.8 mg/dL — ABNORMAL LOW (ref 8.9–10.3)
Chloride: 103 mmol/L (ref 98–111)
Creatinine, Ser: 1.09 mg/dL (ref 0.61–1.24)
GFR, Estimated: >60 mL/min (ref >60)
Glucose, Bld: 102 mg/dL — ABNORMAL HIGH (ref 70–99)
Potassium: 4.6 mmol/L (ref 3.5–5.1)
Sodium: 138 mmol/L (ref 135–145)

## 2023-03-16 LAB — CBC
HCT: 41.2 % (ref 39.0–52.0)
Hemoglobin: 13.2 g/dL (ref 13.0–17.0)
MCH: 28.4 pg (ref 26.0–34.0)
MCHC: 32 g/dL (ref 30.0–36.0)
MCV: 88.8 fL (ref 80.0–100.0)
Platelets: 115 10*3/uL — ABNORMAL LOW (ref 150–400)
RBC: 4.64 MIL/uL (ref 4.22–5.81)
RDW: 15.6 % — ABNORMAL HIGH (ref 11.5–15.5)
WBC: 2.5 10*3/uL — ABNORMAL LOW (ref 4.0–10.5)
nRBC: 0 % (ref 0.0–0.2)

## 2023-03-16 LAB — GLUCOSE, CAPILLARY: Glucose-Capillary: 85 mg/dL (ref 70–99)

## 2023-03-16 NOTE — Progress Notes (Signed)
Remote pacemaker transmission.   

## 2023-03-17 NOTE — Progress Notes (Signed)
Anesthesia Chart Review   Case: [id number] Date/Time: 03/26/23 0845   Procedures:      BIPOLAR TRANSURETHRAL RESECTION OF THE PROSTATE (TURP) - 90 MINUTES NEEDED FOR CASE     CO2 LASER ABLATION OF PENIS   Anesthesia type: General   Pre-op diagnosis: BENIGN PROSTATIC HYPERPLASIA, PENILE INTRAEPITHELIAL NEOPLASIA   Location: [facility] PROCEDURE [facility] / [facility]   Surgeons: Jannifer Hick, MD       DISCUSSION:89 y.o. former smoker with h/o HTN, atrial fibrillation, SSS with pacemaker in place (device orders in 03/15/2023 progress note), COPD, sleep apnea with cpap, CKD Stage II, BPH, penile intraepithelial neoplasm scheduled for above procedure 03/26/2023 with Dr. Jettie Pagan.   Per cardiology preoperative evaluation 03/10/2023, "Chart reviewed as part of pre-operative protocol coverage. Given past medical history and time since last visit, based on ACC/AHA guidelines, Guy Moore is at acceptable risk for the planned procedure without further cardiovascular testing.  Patient was seen recently by Otilio Saber, PA and was found to be at appropriate risk to proceed with scheduled procedure. The patient was advised that if he develops new symptoms prior to surgery to contact our office to arrange for a follow-up visit, and he verbalized understanding. Per office protocol, patient can hold Xarelto for 3 days prior to procedure. "  Anticipate pt can proceed with planned procedure barring acute status change.   VS: BP (!) 140/78 Comment: right arm sitting  Pulse 74   Temp 36.7 C (Oral)   Resp 16   Ht 5\' 10"  (1.778 m)   Wt 103 kg   SpO2 99%   BMI 32.57 kg/m   PROVIDERS: Nelwyn Salisbury, MD is PCP   Cardiologist - Loman Brooklyn, MD  LABS: Labs reviewed: Acceptable for surgery. (all labs ordered are listed, but only abnormal results are displayed)  Labs Reviewed  BASIC METABOLIC PANEL - Abnormal; Notable for the following components:      Result Value   Glucose, Bld 102 (*)    BUN 24 (*)     Calcium 8.8 (*)    All other components within normal limits  CBC - Abnormal; Notable for the following components:   WBC 2.5 (*)    RDW 15.6 (*)    Platelets 115 (*)    All other components within normal limits  GLUCOSE, CAPILLARY     IMAGES:   EKG:   CV: Echo 10/01/2016 Summary:  1. The study quality is technically difficult.  2. Probably normal left ventricular function. Regional wall motion  suboptimally evaluated.  3. Normal left ventricular diastolic filling is observed.  4. The right ventricular cavity size is mildly enlarged. The right  ventricular global systolic function is normal.  5. The right atrium is mildly dilated.  6. The left atrium is mildly dilated.  7. There is mild pulmonic regurgitation present.  8. There is evidence of moderate pulmonary hypertension.  9. Mildly dilated aorta measuring 4.1cm  Past Medical History:  Diagnosis Date   Allergic rhinitis    Arthritis    Asthma    Atrial fibrillation [facility])    sees Dr. Bedelia Person at [facility]    Atrial flutter (HCC)    BCC (basal cell carcinoma of skin)    Nose   Chronic kidney disease    Complication of anesthesia    no problems per patient   COPD with emphysema [facility])    sees Dr. Coralyn Helling    Diverticulosis    Dysrhythmia  afib, flutter and tachycardia   Erosive esophagitis    Esophageal stricture    Gastric ulcer    Hearing loss    uses amplification   Hemorrhoids    Hiatal hernia    History of kidney stones    HTN (hypertension)    Hyperlipidemia    Knee problem    2% permanent partial impairment Right   Macular degeneration    Perineal abscess    Pneumonia    Pre-diabetes    Presence of permanent cardiac pacemaker    Sleep apnea    cpap    Past Surgical History:  Procedure Laterality Date   CARDIAC ELECTROPHYSIOLOGY STUDY AND ABLATION     x 4 (sees Dr. Elmer Picker at [facility])   CYST EXCISION PERINEAL     CYSTOSCOPY WITH URETEROSCOPY AND  STENT PLACEMENT Right 10/29/2022   Procedure: CYSTOSCOPY WITH RETROGRADE WITH URETHERAL DILITATIONRIGHT DIAGNOSTIC  URETEROSCOPY AND STENT PLACEMENT;  Surgeon: Jannifer Hick, MD;  Location: [facility];  Service: Urology;  Laterality: Right;   CYSTOSCOPY/URETEROSCOPY/HOLMIUM LASER/STENT PLACEMENT Right 11/27/2022   Procedure: CYSTOSCOPY/ RIGHT RETROGRADE PYELOGRAM/RIGHT HOLMIUM LASER/RIGHT STENT PLACEMENT/BASKET;  Surgeon: Jannifer Hick, MD;  Location: [facility];  Service: Urology;  Laterality: Right;  60 MINUTES NEEDED FOR CASE   INSERT / REPLACE / REMOVE PACEMAKER     NOSE SURGERY     septoplasty   PENILE BIOPSY N/A 11/27/2022   Procedure: PENILE BIOPSY;  Surgeon: Jannifer Hick, MD;  Location: [facility];  Service: Urology;  Laterality: N/A;   UVULOPALATOPHARYNGOPLASTY  06/13/1979   VASECTOMY  06/12/1969   w/complications    MEDICATIONS:  albuterol (VENTOLIN HFA) 108 (90 Base) MCG/ACT inhaler   amLODipine (NORVASC) 5 MG tablet   BESIVANCE 0.6 % SUSP   Carboxymethylcellul-Glycerin (LUBRICATING EYE DROPS OP)   cyanocobalamin (VITAMIN B12) 1000 MCG/ML injection   fluticasone (FLONASE) 50 MCG/ACT nasal spray   fluticasone-salmeterol (ADVAIR) 250-50 MCG/ACT AEPB   hydrocortisone cream 1 %   MAGNESIUM GLUCONATE PO   Methylcellulose, Laxative, (CITRUCEL PO)   Multiple Vitamins-Minerals (PRESERVISION AREDS 2) CAPS   neomycin-bacitracin-polymyxin (NEOSPORIN) OINT   omeprazole (PRILOSEC) 40 MG capsule   potassium chloride SA (KLOR-CON M) 20 MEQ tablet   pravastatin (PRAVACHOL) 40 MG tablet   tamsulosin (FLOMAX) 0.4 MG CAPS capsule   valsartan (DIOVAN) 160 MG tablet   XARELTO 20 MG TABS tablet   No current facility-administered medications for this encounter.   Jodell Cipro Ward, PA-C [facility]Surgical Testing [phone]

## 2023-03-17 NOTE — Anesthesia Preprocedure Evaluation (Addendum)
Anesthesia Evaluation  Patient identified by MRN, date of birth, ID band Patient awake    Reviewed: Allergy & Precautions, H&P , NPO status , Patient's Chart, lab work & pertinent test results  Airway Mallampati: II  TM Distance: >3 FB Neck ROM: Full    Dental no notable dental hx.    Pulmonary shortness of breath, asthma , sleep apnea , COPD, former smoker   Pulmonary exam normal breath sounds clear to auscultation       Cardiovascular hypertension, Pt. on medications Normal cardiovascular exam+ dysrhythmias + pacemaker  Rhythm:Regular Rate:Normal     Neuro/Psych negative neurological ROS  negative psych ROS   GI/Hepatic Neg liver ROS,GERD  ,,  Endo/Other  negative endocrine ROS    Renal/GU Renal InsufficiencyRenal disease  negative genitourinary   Musculoskeletal  (+) Arthritis , Osteoarthritis,    Abdominal  (+) + obese  Peds negative pediatric ROS (+)  Hematology negative hematology ROS (+)   Anesthesia Other Findings   Reproductive/Obstetrics negative OB ROS                             Anesthesia Physical Anesthesia Plan  ASA: 3  Anesthesia Plan: General   Post-op Pain Management:    Induction: Intravenous  PONV Risk Score and Plan: 2 and Ondansetron, Midazolam and Treatment may vary due to age or medical condition  Airway Management Planned: LMA  Additional Equipment:   Intra-op Plan:   Post-operative Plan: Extubation in OR  Informed Consent: I have reviewed the patients History and Physical, chart, labs and discussed the procedure including the risks, benefits and alternatives for the proposed anesthesia with the patient or authorized representative who has indicated his/her understanding and acceptance.     Dental advisory given  Plan Discussed with: CRNA  Anesthesia Plan Comments: (See PAT note 03/16/2023)       Anesthesia Quick Evaluation

## 2023-03-22 ENCOUNTER — Encounter (INDEPENDENT_AMBULATORY_CARE_PROVIDER_SITE_OTHER): Payer: Medicare Other | Admitting: Ophthalmology

## 2023-03-22 DIAGNOSIS — H353231 Exudative age-related macular degeneration, bilateral, with active choroidal neovascularization: Secondary | ICD-10-CM | POA: Diagnosis not present

## 2023-03-22 DIAGNOSIS — H43813 Vitreous degeneration, bilateral: Secondary | ICD-10-CM

## 2023-03-22 DIAGNOSIS — I10 Essential (primary) hypertension: Secondary | ICD-10-CM | POA: Diagnosis not present

## 2023-03-22 DIAGNOSIS — D3131 Benign neoplasm of right choroid: Secondary | ICD-10-CM

## 2023-03-22 DIAGNOSIS — H35033 Hypertensive retinopathy, bilateral: Secondary | ICD-10-CM | POA: Diagnosis not present

## 2023-03-26 ENCOUNTER — Ambulatory Visit (HOSPITAL_COMMUNITY): Payer: Medicare Other | Admitting: Physician Assistant

## 2023-03-26 ENCOUNTER — Encounter (HOSPITAL_COMMUNITY): Payer: Self-pay | Admitting: Urology

## 2023-03-26 ENCOUNTER — Encounter (HOSPITAL_COMMUNITY)
Admission: RE | Disposition: A | Discharge status: Home / Self Care | Payer: Self-pay | Source: Ambulatory Visit | Attending: Urology

## 2023-03-26 ENCOUNTER — Ambulatory Visit (HOSPITAL_COMMUNITY)
Admission: RE | Admit: 2023-03-26 | Discharge: 2023-03-27 | Disposition: A | Discharge status: Home / Self Care | Payer: Medicare Other | Source: Ambulatory Visit | Attending: Urology | Admitting: Urology

## 2023-03-26 ENCOUNTER — Ambulatory Visit (HOSPITAL_BASED_OUTPATIENT_CLINIC_OR_DEPARTMENT_OTHER): Payer: Medicare Other | Admitting: Anesthesiology

## 2023-03-26 ENCOUNTER — Other Ambulatory Visit: Payer: Self-pay

## 2023-03-26 DIAGNOSIS — N138 Other obstructive and reflux uropathy: Secondary | ICD-10-CM | POA: Diagnosis not present

## 2023-03-26 DIAGNOSIS — D290 Benign neoplasm of penis: Secondary | ICD-10-CM | POA: Diagnosis not present

## 2023-03-26 DIAGNOSIS — R338 Other retention of urine: Secondary | ICD-10-CM | POA: Insufficient documentation

## 2023-03-26 DIAGNOSIS — N4889 Other specified disorders of penis: Secondary | ICD-10-CM | POA: Insufficient documentation

## 2023-03-26 DIAGNOSIS — N320 Bladder-neck obstruction: Secondary | ICD-10-CM | POA: Diagnosis not present

## 2023-03-26 DIAGNOSIS — N400 Enlarged prostate without lower urinary tract symptoms: Secondary | ICD-10-CM | POA: Diagnosis not present

## 2023-03-26 DIAGNOSIS — N401 Enlarged prostate with lower urinary tract symptoms: Secondary | ICD-10-CM

## 2023-03-26 DIAGNOSIS — D0769 Carcinoma in situ of other male genital organs: Secondary | ICD-10-CM

## 2023-03-26 DIAGNOSIS — R310 Gross hematuria: Secondary | ICD-10-CM | POA: Insufficient documentation

## 2023-03-26 DIAGNOSIS — D4959 Neoplasm of unspecified behavior of other genitourinary organ: Secondary | ICD-10-CM | POA: Insufficient documentation

## 2023-03-26 DIAGNOSIS — R0602 Shortness of breath: Secondary | ICD-10-CM | POA: Insufficient documentation

## 2023-03-26 DIAGNOSIS — Z87440 Personal history of urinary (tract) infections: Secondary | ICD-10-CM | POA: Diagnosis not present

## 2023-03-26 DIAGNOSIS — Z6832 Body mass index (BMI) 32.0-32.9, adult: Secondary | ICD-10-CM | POA: Insufficient documentation

## 2023-03-26 DIAGNOSIS — K219 Gastro-esophageal reflux disease without esophagitis: Secondary | ICD-10-CM | POA: Insufficient documentation

## 2023-03-26 DIAGNOSIS — G4730 Sleep apnea, unspecified: Secondary | ICD-10-CM | POA: Insufficient documentation

## 2023-03-26 DIAGNOSIS — J449 Chronic obstructive pulmonary disease, unspecified: Secondary | ICD-10-CM

## 2023-03-26 DIAGNOSIS — D074 Carcinoma in situ of penis: Secondary | ICD-10-CM | POA: Diagnosis not present

## 2023-03-26 DIAGNOSIS — I10 Essential (primary) hypertension: Secondary | ICD-10-CM

## 2023-03-26 DIAGNOSIS — R350 Frequency of micturition: Secondary | ICD-10-CM | POA: Insufficient documentation

## 2023-03-26 DIAGNOSIS — I129 Hypertensive chronic kidney disease with stage 1 through stage 4 chronic kidney disease, or unspecified chronic kidney disease: Secondary | ICD-10-CM | POA: Insufficient documentation

## 2023-03-26 DIAGNOSIS — Z87442 Personal history of urinary calculi: Secondary | ICD-10-CM | POA: Insufficient documentation

## 2023-03-26 DIAGNOSIS — Z950 Presence of cardiac pacemaker: Secondary | ICD-10-CM | POA: Diagnosis not present

## 2023-03-26 DIAGNOSIS — M1990 Unspecified osteoarthritis, unspecified site: Secondary | ICD-10-CM | POA: Insufficient documentation

## 2023-03-26 DIAGNOSIS — N189 Chronic kidney disease, unspecified: Secondary | ICD-10-CM | POA: Insufficient documentation

## 2023-03-26 DIAGNOSIS — E669 Obesity, unspecified: Secondary | ICD-10-CM | POA: Diagnosis not present

## 2023-03-26 DIAGNOSIS — Q6102 Congenital multiple renal cysts: Secondary | ICD-10-CM | POA: Insufficient documentation

## 2023-03-26 DIAGNOSIS — Z87891 Personal history of nicotine dependence: Secondary | ICD-10-CM | POA: Diagnosis not present

## 2023-03-26 DIAGNOSIS — R7303 Prediabetes: Secondary | ICD-10-CM

## 2023-03-26 HISTORY — PX: TRANSURETHRAL RESECTION OF PROSTATE: SHX73

## 2023-03-26 HISTORY — PX: CO2 LASER APPLICATION: SHX5778

## 2023-03-26 LAB — CBC
HCT: 38.5 % — ABNORMAL LOW (ref 39.0–52.0)
Hemoglobin: 12.6 g/dL — ABNORMAL LOW (ref 13.0–17.0)
MCH: 29.2 pg (ref 26.0–34.0)
MCHC: 32.7 g/dL (ref 30.0–36.0)
MCV: 89.1 fL (ref 80.0–100.0)
Platelets: 109 10*3/uL — ABNORMAL LOW (ref 150–400)
RBC: 4.32 MIL/uL (ref 4.22–5.81)
RDW: 15.8 % — ABNORMAL HIGH (ref 11.5–15.5)
WBC: 1.8 10*3/uL — ABNORMAL LOW (ref 4.0–10.5)
nRBC: 0 % (ref 0.0–0.2)

## 2023-03-26 LAB — BASIC METABOLIC PANEL
Anion gap: 8 (ref 5–15)
BUN: 20 mg/dL (ref 8–23)
CO2: 27 mmol/L (ref 22–32)
Calcium: 8.3 mg/dL — ABNORMAL LOW (ref 8.9–10.3)
Chloride: 103 mmol/L (ref 98–111)
Creatinine, Ser: 1.02 mg/dL (ref 0.61–1.24)
GFR, Estimated: >60 mL/min (ref >60)
Glucose, Bld: 123 mg/dL — ABNORMAL HIGH (ref 70–99)
Potassium: 4 mmol/L (ref 3.5–5.1)
Sodium: 138 mmol/L (ref 135–145)

## 2023-03-26 SURGERY — TURP (TRANSURETHRAL RESECTION OF PROSTATE)
Anesthesia: General

## 2023-03-26 MED ORDER — ACETAMINOPHEN 325 MG PO TABS: 650.0000 mg | ORAL_TABLET | ORAL | Status: DC | PRN

## 2023-03-26 MED ORDER — IRBESARTAN 150 MG PO TABS
150.0000 mg | ORAL_TABLET | Freq: Every day | ORAL | Status: DC
  Administered 2023-03-27: 150 mg via ORAL
  Filled 2023-03-26: qty 1

## 2023-03-26 MED ORDER — DOCUSATE SODIUM 100 MG PO CAPS: 100.0000 mg | ORAL_CAPSULE | Freq: Every day | ORAL | 0 refills | Status: DC | PRN

## 2023-03-26 MED ORDER — LIDOCAINE HCL 1 % IJ SOLN
INTRAMUSCULAR | Status: AC
  Filled 2023-03-26: qty 20

## 2023-03-26 MED ORDER — PROPOFOL 10 MG/ML IV BOLUS
INTRAVENOUS | Status: DC | PRN
  Administered 2023-03-26: 200 mg via INTRAVENOUS

## 2023-03-26 MED ORDER — HYDROMORPHONE HCL 1 MG/ML IJ SOLN
0.2500 mg | INTRAMUSCULAR | Status: DC | PRN
  Administered 2023-03-26: 0.25 mg via INTRAVENOUS

## 2023-03-26 MED ORDER — OXYCODONE-ACETAMINOPHEN 5-325 MG PO TABS: 1.0000 | ORAL_TABLET | ORAL | 0 refills | Status: DC | PRN

## 2023-03-26 MED ORDER — 0.9 % SODIUM CHLORIDE (POUR BTL) OPTIME
TOPICAL | Status: DC | PRN
  Administered 2023-03-26: 1000 mL

## 2023-03-26 MED ORDER — CEFAZOLIN SODIUM-DEXTROSE 2-4 GM/100ML-% IV SOLN
2.0000 g | INTRAVENOUS | Status: AC
  Administered 2023-03-26: 2 g via INTRAVENOUS
  Filled 2023-03-26: qty 100

## 2023-03-26 MED ORDER — SODIUM CHLORIDE 0.9 % IR SOLN
Status: DC | PRN
  Administered 2023-03-26 (×2): 3000 mL via INTRAVESICAL
  Administered 2023-03-26 (×2): 6000 mL via INTRAVESICAL

## 2023-03-26 MED ORDER — ORAL CARE MOUTH RINSE: 15.0000 mL | Freq: Once | OROMUCOSAL | Status: AC

## 2023-03-26 MED ORDER — POTASSIUM CHLORIDE CRYS ER 20 MEQ PO TBCR
20.0000 meq | EXTENDED_RELEASE_TABLET | Freq: Every day | ORAL | Status: DC
  Administered 2023-03-26 – 2023-03-27 (×2): 20 meq via ORAL
  Filled 2023-03-26 (×2): qty 1

## 2023-03-26 MED ORDER — HYDROMORPHONE HCL 1 MG/ML IJ SOLN
INTRAMUSCULAR | Status: AC
  Administered 2023-03-26: 0.25 mg via INTRAVENOUS
  Filled 2023-03-26: qty 1

## 2023-03-26 MED ORDER — OXYCODONE-ACETAMINOPHEN 5-325 MG PO TABS: 1.0000 | ORAL_TABLET | ORAL | Status: DC | PRN

## 2023-03-26 MED ORDER — FENTANYL CITRATE (PF) 100 MCG/2ML IJ SOLN
INTRAMUSCULAR | Status: DC | PRN
  Administered 2023-03-26 (×4): 25 ug via INTRAVENOUS

## 2023-03-26 MED ORDER — ALBUTEROL SULFATE (2.5 MG/3ML) 0.083% IN NEBU: 2.5000 mg | INHALATION_SOLUTION | Freq: Four times a day (QID) | RESPIRATORY_TRACT | Status: DC | PRN

## 2023-03-26 MED ORDER — CHLORHEXIDINE GLUCONATE 0.12 % MT SOLN
15.0000 mL | Freq: Once | OROMUCOSAL | Status: AC
  Administered 2023-03-26: 15 mL via OROMUCOSAL

## 2023-03-26 MED ORDER — SODIUM CHLORIDE 0.9 % IR SOLN
3000.0000 mL | Status: DC
  Administered 2023-03-26: 3000 mL

## 2023-03-26 MED ORDER — LIDOCAINE 2% (20 MG/ML) 5 ML SYRINGE
INTRAMUSCULAR | Status: DC | PRN
  Administered 2023-03-26: 100 mg via INTRAVENOUS

## 2023-03-26 MED ORDER — OXYBUTYNIN CHLORIDE ER 5 MG PO TB24
10.0000 mg | ORAL_TABLET | Freq: Every day | ORAL | Status: DC
  Administered 2023-03-26 – 2023-03-27 (×2): 10 mg via ORAL
  Filled 2023-03-26 (×3): qty 2

## 2023-03-26 MED ORDER — HYDROMORPHONE HCL 1 MG/ML IJ SOLN: 0.5000 mg | INTRAMUSCULAR | Status: DC | PRN

## 2023-03-26 MED ORDER — PROPOFOL 10 MG/ML IV BOLUS
INTRAVENOUS | Status: AC
  Filled 2023-03-26: qty 20

## 2023-03-26 MED ORDER — DIPHENHYDRAMINE HCL 12.5 MG/5ML PO ELIX: 12.5000 mg | ORAL_SOLUTION | Freq: Four times a day (QID) | ORAL | Status: DC | PRN

## 2023-03-26 MED ORDER — SODIUM CHLORIDE 0.9% FLUSH: 3.0000 mL | INTRAVENOUS | Status: DC | PRN

## 2023-03-26 MED ORDER — OXYCODONE HCL 5 MG/5ML PO SOLN: 5.0000 mg | Freq: Once | ORAL | Status: DC | PRN

## 2023-03-26 MED ORDER — LACTATED RINGERS IV SOLN: INTRAVENOUS | Status: DC

## 2023-03-26 MED ORDER — PROMETHAZINE HCL 25 MG/ML IJ SOLN: 6.2500 mg | INTRAMUSCULAR | Status: DC | PRN

## 2023-03-26 MED ORDER — BACITRACIN-NEOMYCIN-POLYMYXIN OINTMENT TUBE
TOPICAL_OINTMENT | CUTANEOUS | Status: DC | PRN
  Administered 2023-03-26: 1 via TOPICAL

## 2023-03-26 MED ORDER — PRAVASTATIN SODIUM 40 MG PO TABS
40.0000 mg | ORAL_TABLET | Freq: Every day | ORAL | Status: DC
  Administered 2023-03-26: 40 mg via ORAL
  Filled 2023-03-26: qty 1

## 2023-03-26 MED ORDER — SODIUM CHLORIDE 0.9 % IV SOLN: 250.0000 mL | INTRAVENOUS | Status: DC | PRN

## 2023-03-26 MED ORDER — AMISULPRIDE (ANTIEMETIC) 5 MG/2ML IV SOLN: 10.0000 mg | Freq: Once | INTRAVENOUS | Status: DC | PRN

## 2023-03-26 MED ORDER — AMLODIPINE BESYLATE 5 MG PO TABS
5.0000 mg | ORAL_TABLET | Freq: Every day | ORAL | Status: DC
  Administered 2023-03-27: 5 mg via ORAL
  Filled 2023-03-26: qty 1

## 2023-03-26 MED ORDER — TAMSULOSIN HCL 0.4 MG PO CAPS
0.4000 mg | ORAL_CAPSULE | Freq: Every day | ORAL | Status: DC
  Administered 2023-03-26: 0.4 mg via ORAL
  Filled 2023-03-26: qty 1

## 2023-03-26 MED ORDER — MOMETASONE FURO-FORMOTEROL FUM 200-5 MCG/ACT IN AERO
2.0000 | INHALATION_SPRAY | Freq: Two times a day (BID) | RESPIRATORY_TRACT | Status: DC
  Administered 2023-03-26 – 2023-03-27 (×2): 2 via RESPIRATORY_TRACT
  Filled 2023-03-26: qty 8.8

## 2023-03-26 MED ORDER — OXYCODONE HCL 5 MG PO TABS: 5.0000 mg | ORAL_TABLET | Freq: Once | ORAL | Status: DC | PRN

## 2023-03-26 MED ORDER — FENTANYL CITRATE (PF) 100 MCG/2ML IJ SOLN
INTRAMUSCULAR | Status: AC
  Filled 2023-03-26: qty 2

## 2023-03-26 MED ORDER — BACITRACIN ZINC 500 UNIT/GM EX OINT
TOPICAL_OINTMENT | CUTANEOUS | Status: AC
  Filled 2023-03-26: qty 28.35

## 2023-03-26 MED ORDER — ONDANSETRON HCL 4 MG/2ML IJ SOLN
INTRAMUSCULAR | Status: DC | PRN
  Administered 2023-03-26: 4 mg via INTRAVENOUS

## 2023-03-26 MED ORDER — DIPHENHYDRAMINE HCL 50 MG/ML IJ SOLN: 12.5000 mg | Freq: Four times a day (QID) | INTRAMUSCULAR | Status: DC | PRN

## 2023-03-26 MED ORDER — SODIUM CHLORIDE 0.45 % IV SOLN: INTRAVENOUS | Status: DC

## 2023-03-26 MED ORDER — LIDOCAINE HCL 1 % IJ SOLN
INTRAMUSCULAR | Status: DC | PRN
  Administered 2023-03-26: 10 mL

## 2023-03-26 MED ORDER — SODIUM CHLORIDE 0.9% FLUSH
3.0000 mL | Freq: Two times a day (BID) | INTRAVENOUS | Status: DC
  Administered 2023-03-26 (×2): 3 mL via INTRAVENOUS

## 2023-03-26 SURGICAL SUPPLY — 24 items
BAG DRN RND TRDRP ANRFLXCHMBR (UROLOGICAL SUPPLIES) ×1
BAG URINE DRAIN 2000ML AR STRL (UROLOGICAL SUPPLIES) ×1 IMPLANT
BAG URO CATCHER STRL LF (MISCELLANEOUS) ×1 IMPLANT
CATH FOLEY 3WAY 30CC 24FR (CATHETERS)
CATH URTH STD 24FR FL 3W 2 (CATHETERS) IMPLANT
DRAPE FOOT SWITCH (DRAPES) ×1 IMPLANT
DRSG TELFA 3X8 NADH STRL (GAUZE/BANDAGES/DRESSINGS) IMPLANT
ELECT REM PT RETURN 15FT ADLT (MISCELLANEOUS) IMPLANT
GAUZE SPONGE 4X4 12PLY STRL (GAUZE/BANDAGES/DRESSINGS) IMPLANT
GLOVE SURG LX STRL 7.5 STRW (GLOVE) ×1 IMPLANT
GOWN STRL REUS W/ TWL XL LVL3 (GOWN DISPOSABLE) ×1 IMPLANT
GOWN STRL REUS W/TWL XL LVL3 (GOWN DISPOSABLE) ×1
GUIDEWIRE STR DUAL SENSOR (WIRE) IMPLANT
HOLDER FOLEY CATH W/STRAP (MISCELLANEOUS) IMPLANT
IV CATH 14GX2 1/4 (CATHETERS) IMPLANT
KIT TURNOVER KIT A (KITS) IMPLANT
LOOP CUT BIPOLAR 24F LRG (ELECTROSURGICAL) IMPLANT
MANIFOLD NEPTUNE II (INSTRUMENTS) ×1 IMPLANT
PACK CYSTO (CUSTOM PROCEDURE TRAY) ×1 IMPLANT
SYR 30ML LL (SYRINGE) ×1 IMPLANT
SYR TOOMEY IRRIG 70ML (MISCELLANEOUS) ×1
SYRINGE TOOMEY IRRIG 70ML (MISCELLANEOUS) ×1 IMPLANT
TUBING CONNECTING 10 (TUBING) ×1 IMPLANT
TUBING UROLOGY SET (TUBING) ×1 IMPLANT

## 2023-03-26 NOTE — Anesthesia Procedure Notes (Signed)
Procedure Name: LMA Insertion Date/Time: 03/26/2023 8:52 AM  Performed by: Vanessa [place], CRNAPre-anesthesia Checklist: Emergency Drugs available, Patient identified, Suction available and Patient being monitored Patient Re-evaluated:Patient Re-evaluated prior to induction Oxygen Delivery Method: Circle system utilized Preoxygenation: Pre-oxygenation with 100% oxygen Induction Type: IV induction Ventilation: Mask ventilation without difficulty LMA: LMA inserted LMA Size: 4.0 Number of attempts: 1 Placement Confirmation: positive ETCO2 and breath sounds checked- equal and bilateral Tube secured with: Tape Dental Injury: Teeth and Oropharynx as per pre-operative assessment

## 2023-03-26 NOTE — H&P (Signed)
Office Visit Report 03/04/2023    Guy Moore         MRN: [id number]  PRIMARY CARE:  Tera Mater. Clent Ridges, MD    PRIMARY CARE FAX:  [phone]    REFERRING:  Gwynneth Macleod, NP  DOB: 01-06-1934, 89 year old Male  PROVIDER:  Jettie Pagan, M.D.  SSN:   LOCATION:  [facility] [phone]    CC/HPI: Guy Moore is an 89 year old male seen as seen today in follow-up with history of urolithiasis, urinary retention and penile intraepithelial neoplasia.   1. Right ureteral stone:  -He has a history of urolithiasis with CT in 07/2021 demonstrating nonobstructing right renal stones measuring 3 mm and 2 mm.  -He developed acute onset right-sided flank pain presented to ED and CT A/P 10/27/2022 revealed a 9 mm x 2 x 3 mm linear stone collection at the distal right ureter with mild right hydronephrosis. He also had an 8 mm right upper pole stone. Of note, few days prior, he was diagnosed with UTI.  -Urine culture 10/22/2022 revealed Enterobacter cloacae a greater than 100,000 and he has been taking ciprofloxacin.  -S/p right ureteral stent placement on 10/29/2022. Unfortunately, postoperatively, he developed urinary retention. Urine culture 11/04/2021 resulted >100K staph aereus and >100K Enterococcus faecalis.  -S/p right ureteroscopy with laser lithotripsy and basket extraction of stones on 11/27/2022.  -RUS 12/22/2022 with no hydronephrosis. Stable bilateral renal cysts.  -He denies abdominal pain or flank pain.   #2[name] hematuria: Resolved. Negative evaluation in 06/2020 other than urolithiasis as above. Cystoscopy without suspicious bladder lesions in 06/2020. He does have a smoking history smoking 1 pack/day for 15 years. He quit at age 53. His father had bladder cancer.   3. Urinary retention: He developed urinary tissue postoperatively after right ureteral stent placement on 10/29/2022. This is also concomitant with signs of urinary tract infection. Failed void trial on 10/2022 and  11/2022. Catheter exchange on 03/04/2023. He has upcoming TURP scheduled.   #4. Penile intraepithelial neoplasia:  -He was found to have glans lesion during right stent placement on 10/29/2022 and underwent penile glans excisional biopsy on 11/27/2022. Pathology: Penile intraepithelial neoplasia, HPV associated (PeIN, warty/basaloid). He has upcoming CO2 ablation scheduled.   5. Bilateral renal cysts: Simple cyst on CT A/P 07/22/2020. No need for continued surveillance.   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     ALLERGIES: NKDA    MEDICATIONS: Omeprazole  Tamsulosin Hcl 0.4 mg capsule 1 capsule PO Q HS  Albuterol Sulfate  Amlodipine Besilate  Fluticasone Propionate  Magnesium Gluconate  Methylcellulose  Multivitamin  Potassium Chloride  Pravastatin Sodium  Tylenol  Valsartan     GU PSH: Biopsy Penis - 11/27/2022 Complex cystometrogram, w/ void pressure and urethral pressure profile studies, any technique - 12/28/2022 Complex Uroflow - 12/28/2022 Cystoscopy - 2021 Cystoscopy Insert Stent, Right - 10/29/2022 Cystoscopy Ureteroscopy - 10/29/2022 Emg surf Electrd - 12/28/2022 Inject For cystogram - 12/28/2022 Intrabd voidng Press - 12/28/2022 Locm 300-399Mg /Ml Iodine,1Ml - 2021 Ureteroscopic laser litho, Right - 11/27/2022 Vasectomy     NON-GU PSH: Excise Uvula         GU PMH: Carcinoma in situ of penis - 01/28/2023, - 12/22/2022, - 12/03/2022 Urinary Retention - 01/28/2023, - 12/28/2022, - 12/03/2022, - 11/16/2022, - 11/10/2022, - 11/09/2022 BPH w/LUTS - 12/22/2022, - 12/03/2022 Renal calculus - 12/22/2022, - 12/03/2022, - 11/09/2022, - 10/29/2022, - 08/20/2022, - 08/19/2021, - 2021 Renal cyst - 12/22/2022, - 12/03/2022, -  11/09/2022, - 10/29/2022, - 08/19/2021, - 2021 Urinary Frequency - 12/22/2022, - 11/05/2022, - 2021 Inflammatory disorders, penis (Stable) - 11/16/2022, Discovered at time of exam under anesthesia with concerns for possible penile neoplasm. He is to have formal biopsy at time of his  scheduled URS with Dr Cardell Peach. This was explained in detail to the patient today., - 11/10/2022 Ureteral calculus - 11/10/2022, - 11/09/2022, - 10/29/2022 Acute Cystitis/UTI - 11/09/2022 Balanitis - 2021 Gross hematuria - 2021, - 2021, - 2021 Dysuria - 2021    NON-GU PMH: Arrhythmia Arthritis Asthma Atrial Fibrillation GERD Presence of cardiac pacemaker    FAMILY HISTORY: 2 sons - Runs in Family 3 daughters - Runs in Family Bladder Cancer - Father Heart Attack - Mother   SOCIAL HISTORY: Marital Status: Married Ethnicity: Not Hispanic Or Latino; Race: White Current Smoking Status: Patient does not smoke anymore. Has not smoked since 07/13/1975.  <DIV'  Tobacco Use Assessment Completed:  Used Tobacco in last 30 days?   Does not use smokeless tobacco. Drinks 2 drinks per week. Light Drinker.  Does not use drugs. Drinks 2 caffeinated drinks per day.    REVIEW OF SYSTEMS:    GU Review Male:  Patient denies frequent urination, have to strain to urinate , get up at night to urinate, trouble starting your stream, leakage of urine, penile pain, erection problems, hard to postpone urination, stream starts and stops, and burning/ pain with urination.   Gastrointestinal (Upper):  Patient denies nausea, vomiting, and indigestion/ heartburn.   Gastrointestinal (Lower):  Patient denies diarrhea and constipation.   Constitutional:  Patient denies fever, night sweats, weight loss, and fatigue.   Skin:  Patient denies skin rash/ lesion and itching.   Eyes:  Patient denies blurred vision and double vision.   Ears/ Nose/ Throat:  Patient denies sore throat and sinus problems.   Hematologic/Lymphatic:  Patient denies swollen glands and easy bruising.   Cardiovascular:  Patient denies leg swelling and chest pains.   Respiratory:  Patient denies cough and shortness of breath.   Endocrine:  Patient denies excessive thirst.   Musculoskeletal:  Patient denies back pain and joint pain.   Neurological:  Patient  denies headaches and dizziness.   Psychologic:  Patient denies depression and anxiety.   VITAL SIGNS: None   MULTI-SYSTEM PHYSICAL EXAMINATION:    Constitutional: Well-nourished. No physical deformities. Normally developed. Good grooming.   Respiratory: No labored breathing, no use of accessory muscles.    Cardiovascular: Normal temperature, normal extremity pulses, no swelling, no varicosities.   Gastrointestinal: No mass, no tenderness, no rigidity, non obese abdomen.        Complexity of Data:   Source Of History:  Patient, Medical Record Summary  Records Review:  Previous Doctor Records, Previous Patient Records  Urine Test Review:  Urinalysis   PROCEDURES:    Simple Foley Indwelling Cath Change - 51702  The patient's indwelling foley tube was carefully removed. A 16 French Foley catheter was inserted into the bladder using sterile technique. The patient was taught routine catheter care. Hand irrigation of the bladder with sterile water was performed. A bedside bag was connected.    Visit Complexity - G2211      ASSESSMENT:     ICD-10 Details  1 GU:  BPH w/LUTS - N40.1   2  Urinary Retention - R33.8   3  Carcinoma in situ of penis - D07.4    PLAN:   Document  Letter(s):  Created for Patient: Clinical Summary  Notes:   1. Urolithiasis:  -S/p right ureteroscopy with laser lithotripsy and basket extraction of stones on 11/27/2022.  -Reviewed RUS with no hydronephrosis  -Discussed and prevention as below   We discussed dietary methods for stone prevention including the following: increased water intake to 2-3 liters per day, add lemon or lemon concentrate to water to increase citrate which is beneficial for stone prevention, limiting dietary sodium to less than 2000 mg per day, limiting animal protein to less than 2 servings (16 ounces/day), and limiting foods high in oxalate content (spinach, beans, chocolate, etc.).   #2[name] hematuria: Isolated episode in 06/2020.  Negative evaluation other than renal stones. No recent episodes.   3. Bilateral renal cysts: Simple cyst on CT A/P 07/22/2020. No need for continued surveillance.   4. Urinary retention: Failed void trial 12/03/2022. TRUS volume 80 cc. UDS with bladder function. He has upcoming TURP scheduled. This has been delayed given pulmonary issue however his pulm issues have resolved.   #4. Penile intraepithelial neoplasia:  -S/p penile glans excisional biopsy on 11/27/2022. Pathology: Penile intraepithelial neoplasia, HPV associated (PeIN, warty/basaloid)  -Discussed options for management including topical therapy with 5% 5-FEU or 5% imiquimod. We discussed these eradicate CIS in about 50% of cases. We discussed risk of these therapies including dermatitis which may stimulate worsening of CIS. We also discussed other options including wide local excision, ablation and glans resurfacing. We discussed CO2 ablation. He elects proceed with this.   CC: Guy Moore   Next Appointment:    Next Appointment: 03/26/2023 09:00 AM   Appointment Type: Surgery    Location: [facility] [phone]   Provider: Jettie Pagan, M.D.   Reason for Visit: [facility]/OBS BIPOLAR TURP, CO2 LASER ABLATION OF THE PENIS    Urology Preoperative H&P   Chief Complaint: BPH and penile intraepithelial neoplasia  History of Present Illness: Guy Moore is a 89 y.o. male with BPH and PEIN here for TURP and CO2 laser ablation of penile lesion. Denies fevers, chills, dysuria.    Past Medical History:  Diagnosis Date   Allergic rhinitis    Arthritis    Asthma    Atrial fibrillation [facility])    sees Dr. Bedelia Person at [facility]    Atrial flutter (HCC)    BCC (basal cell carcinoma of skin)    Nose   Chronic kidney disease    Complication of anesthesia    no problems per patient   COPD with emphysema (HCC)    sees Dr. Coralyn Helling    Diverticulosis    Dysrhythmia    afib, flutter and  tachycardia   Erosive esophagitis    Esophageal stricture    Gastric ulcer    Hearing loss    uses amplification   Hemorrhoids    Hiatal hernia    History of kidney stones    HTN (hypertension)    Hyperlipidemia    Knee problem    2% permanent partial impairment Right   Macular degeneration    Perineal abscess    Pneumonia    Pre-diabetes    Presence of permanent cardiac pacemaker    Sleep apnea    cpap    Past Surgical History:  Procedure Laterality Date   CARDIAC ELECTROPHYSIOLOGY STUDY AND ABLATION     x 4 (sees Dr. Elmer Picker at [facility])   CYST EXCISION PERINEAL     CYSTOSCOPY WITH URETEROSCOPY AND STENT PLACEMENT Right 10/29/2022   Procedure: CYSTOSCOPY WITH  RETROGRADE WITH URETHERAL DILITATIONRIGHT DIAGNOSTIC  URETEROSCOPY AND STENT PLACEMENT;  Surgeon: Jannifer Hick, MD;  Location: [facility];  Service: Urology;  Laterality: Right;   CYSTOSCOPY/URETEROSCOPY/HOLMIUM LASER/STENT PLACEMENT Right 11/27/2022   Procedure: CYSTOSCOPY/ RIGHT RETROGRADE PYELOGRAM/RIGHT HOLMIUM LASER/RIGHT STENT PLACEMENT/BASKET;  Surgeon: Jannifer Hick, MD;  Location: [facility];  Service: Urology;  Laterality: Right;  60 MINUTES NEEDED FOR CASE   INSERT / REPLACE / REMOVE PACEMAKER     NOSE SURGERY     septoplasty   PENILE BIOPSY N/A 11/27/2022   Procedure: PENILE BIOPSY;  Surgeon: Jannifer Hick, MD;  Location: [facility];  Service: Urology;  Laterality: N/A;   UVULOPALATOPHARYNGOPLASTY  06/13/1979   VASECTOMY  06/12/1969   w/complications    Allergies: No Known Allergies  Family History  Problem Relation Age of Onset   Stroke Mother    Breast cancer Mother    Heart attack Mother    Other Mother        brain tunor   Heart disease Father    Hypertension Father    Bladder Cancer Father    Parkinson's disease Father    Stroke Father    Breast cancer Brother    Atrial fibrillation Brother    Tremor Brother    Stroke Brother    Hypertension Brother    Breast cancer Maternal  Grandmother    Heart disease Maternal Grandmother    Heart disease Maternal Grandfather    Arthritis Daughter    Heart disease Daughter    Colitis Daughter        Lymphocytic   Colon cancer Neg Hx     Social History:  reports that he quit smoking about 45 years ago. His smoking use included cigarettes. He has a 28.00 pack-year smoking history. He has never used smokeless tobacco. He reports that he does not currently use alcohol. He reports that he does not use drugs.  ROS: A complete review of systems was performed.  All systems are negative except for pertinent findings as noted.  Physical Exam:  Vital signs in last 24 hours: Temp:  [98.2 F (36.8 C)] 98.2 F (36.8 C) (06/14 0710) Pulse Rate:  [80] 80 (06/14 0710) Resp:  [18] 18 (06/14 0710) BP: (145)/(87) 145/87 (06/14 0710) SpO2:  [96 %] 96 % (06/14 0710) Constitutional:  Alert and oriented, No acute distress Cardiovascular: Regular rate and rhythm Respiratory: Normal respiratory effort, Lungs clear bilaterally GI: Abdomen is soft, nontender, nondistended, no abdominal masses GU: No CVA tenderness Lymphatic: No lymphadenopathy Neurologic: Grossly intact, no focal deficits Psychiatric: Normal mood and affect  Laboratory Data:  No results for input(s): "WBC", "HGB", "HCT", "PLT" in the last 72 hours.  No results for input(s): "NA", "K", "CL", "GLUCOSE", "BUN", "CALCIUM", "CREATININE" in the last 72 hours.  Invalid input(s): "CO3"   No results found for this or any previous visit (from the past 24 hour(s)). No results found for this or any previous visit (from the past 240 hour(s)).  Renal Function: No results for input(s): "CREATININE" in the last 168 hours. Estimated Creatinine Clearance: 57.4 mL/min (by C-G formula based on SCr of 1.09 mg/dL).  Radiologic Imaging: No results found.  I independently reviewed the above imaging studies.  Assessment and Plan Guy Moore is a 89 y.o. male with BPH and PEIN  here for TURP and CO2 laser ablation of penile lesion.   Guy R. Hasini Peachey MD 03/26/2023, 7:14 AM  [facility] Pager: [phone]

## 2023-03-26 NOTE — Op Note (Signed)
Operative Note  Preoperative diagnosis:  1.  BPH with bladder outlet obstruction 2. Penile intraepithelial neoplasia  Postoperative diagnosis: 1.  BPH with bladder outlet obstruction 2. Penile intraepithelial neoplasia  Procedure(s): 1.  Bipolar transurethral resection of prostate 2. CO2 laser ablation of penile intraepithelial neoplasia on the glans 3. Penile nerve block  Surgeon: Jettie Pagan, MD  Assistants:  None  Anesthesia:  General  Complications:  None  EBL:  Minimal  Specimens: 1. Prostate chips ID Type Source Tests Collected by Time Destination  1 : Prostate chips Tissue PATH Prostate TURP SURGICAL PATHOLOGY Jannifer Hick, MD 03/26/2023 [phone]    Drains/Catheters: 1.  22Fr 3 way catheter with 30ml water into balloon  Intraoperative findings:   Bilobar obstructing lobes, excellent wide open resection with excellent hemostasis. Penile intraepithelial neoplasia with approximately 1cm lesion on the right aspect of the penile glans with partial extension into right meatus. There were a few other scattered lesions on the right proximal glans. All of these areas were ablated with excellent hemostasis and bacitracin applied.  Indication:  Guy Moore is a 89 y.o. male with BPH with bladder outlet obstruction presenting for transurethral resection of the prostate as well as penile intraepithelial neoplasia here for CO2 ablation.  Description of procedure: The indication, alternatives, benefits and risks were discussed with the patient and informed consent was obtained.  Patient was brought to the operating room table, positioned supine, secured with a safety strap.  Pneumatic compression devices were placed on the lower extremities.  After the administration of intravenous antibiotics and general anesthesia, the patient was repositioned into the dorsal lithotomy position.  All pressure points were carefully padded.  A rectal examination was performed confirming a smooth  symmetric enlarged gland.  The genitalia were prepped and draped in standard sterile manner.  A timeout was completed, verifying the correct patient, surgical procedure and positioning prior to beginning the procedure.  Isotonic sodium chloride was used for irrigation.  A 26 French continuous-flow resectoscope sheath with the visual obturator and a 30 degree lens was advanced under direct vision into the bladder.  The anterior urethra appeared normal in its entirety. The prostatic urethra was elongated with bilobar hyperplasia.  On cystoscopic evaluation, his bladder capacity appeared enlarged. The bladder wall was noted to expand symmetrically in all dimensions.  There were no tumors, stones or foreign bodies present. The bladder was  trabeculated with normal-appearing mucosa.  Both ureteral orifices were in their normal anatomic positions with clear urinary reflux noted bilaterally.  The obturator was removed and replaced by the working element with a resection loop.  The location of the ureteral orifices and the prostatic configuration were again confirmed.  Starting at the bladder neck and proceeding distally to the verumontanum a transurethral section of the prostate was performed using bipolar using energy of 4 and 5 for cutting and coagulation, respectively.  The procedure began at the bladder neck at the 5 o'clock and 7 o'clock positions and carefully carried distally to the verumontanum, resecting the intervening prostatic adenoma.  Next the left lateral lobe was resected to the level of the transverse capsular fibers.  The identical procedure was performed on the right lobe.  Attention was then directed anteriorly and the resection was completed from the 10 o'clock to 2 o'clock positions.  All bleeding vessels were fulgurated achieving meticulous hemostasis.  The bladder was irrigated with a Toomey syringe, ensuring removal of all prostate chips which were sent to pathology for evaluation.  Having  completed the resection and the chips removed, we again confirmed hemostasis with the loop with coagulating current.  Upon completion of the entire procedure, the bladder and posterior urethra were reexamined, confirming open prostatic urethra and bladder neck without evidence of bleeding or perforation.  Both ureteral orifices and the external sphincter were noted to be intact.  The resectoscope was withdrawn under direct vision and a 22 French three-way Foley catheter with a 30 cc balloon was inserted into the bladder.  The balloon was inflated with 30 cc of sterile water.  After multiple manual irrigations ensuring clear return of the irrigant, the catheter was attached to a drainage bag and continuous bladder irrigation was started with normal saline.   Next, attention was turned towards the glans penis.  I performed a dorsal penile nerve block and using 10 cc of lidocaine.  Using the CO2 laser with a wattage of 7, the 1 cm right penile lesion consistent with prior documented biopsy penile intraepithelial neoplasia was then ablated in its entirety.  A portion of this lesion did extend just barely into the meatus on the right side and this also was ablated.  There are a few smaller scattered lesions towards the right proximal glans and these were also treated.  Hemostasis was excellent.  I then applied bacitracin over the glans.  At the end of the procedure, all counts were correct.  Patient tolerated the procedure well and was taken to the recovery room satisfactory condition.  Plan: Continuous bladder irrigation overnight with gentle Foley traction.  Plan to discharge home tomorrow with Foley catheter in place and void trial in the office in 3 days.  [name] MD [facility]  Pager: [phone]

## 2023-03-26 NOTE — Transfer of Care (Signed)
Immediate Anesthesia Transfer of Care Note  Patient: Guy Moore  Procedure(s) Performed: BIPOLAR TRANSURETHRAL RESECTION OF THE PROSTATE (TURP) CO2 LASER ABLATION OF PENIS  Patient Location: PACU  Anesthesia Type:General  Level of Consciousness: awake and patient cooperative  Airway & Oxygen Therapy: Patient Spontanous Breathing and Patient connected to face mask  Post-op Assessment: Report given to RN and Post -op Vital signs reviewed and stable  Post vital signs: Reviewed and stable  Last Vitals:  Vitals Value Taken Time  BP 124/71 03/26/23 1000  Temp    Pulse 70 03/26/23 1004  Resp 11 03/26/23 1004  SpO2 100 % 03/26/23 1004  Vitals shown include unvalidated device data.  Last Pain:  Vitals:   03/26/23 0726  TempSrc:   PainSc: 0-No pain         Complications: No notable events documented.

## 2023-03-26 NOTE — Discharge Instructions (Signed)
Activity:  You are encouraged to ambulate frequently (about every hour during waking hours) to help prevent blood clots from forming in your legs or lungs.    Diet: You should advance your diet as instructed by your physician.  It will be normal to have some bloating, nausea, and abdominal discomfort intermittently.  Prescriptions:  You will be provided a prescription for pain medication to take as needed.  If your pain is not severe enough to require the prescription pain medication, you may take extra strength Tylenol instead which will have less side effects.  You should also take a prescribed stool softener to avoid straining with bowel movements as the prescription pain medication may constipate you.  What to call us about: You should call the office [phone]) if you develop fever > 101 or develop persistent vomiting. Activity:  You are encouraged to ambulate frequently (about every hour during waking hours) to help prevent blood clots from forming in your legs or lungs.    You have a foley catheter in place. Return to clinic in 3 days for foley removal.  Continue bacitracin cream to the glans penis 2-3x daily for one week.  Continue to hold your anticoagulation through Monday, 03/29/23.

## 2023-03-26 NOTE — Anesthesia Postprocedure Evaluation (Signed)
Anesthesia Post Note  Patient: Guy Moore  Procedure(s) Performed: BIPOLAR TRANSURETHRAL RESECTION OF THE PROSTATE (TURP) CO2 LASER ABLATION OF PENIS     Patient location during evaluation: PACU Anesthesia Type: General Level of consciousness: awake and alert Pain management: pain level controlled Vital Signs Assessment: post-procedure vital signs reviewed and stable Respiratory status: spontaneous breathing, nonlabored ventilation and respiratory function stable Cardiovascular status: blood pressure returned to baseline and stable Postop Assessment: no apparent nausea or vomiting Anesthetic complications: no   No notable events documented.  Last Vitals:  Vitals:   03/26/23 1045 03/26/23 1100  BP: 133/76 (!) 143/83  Pulse: 69 70  Resp: 11 14  Temp:  36.4 C  SpO2: 99% 97%    Last Pain:  Vitals:   03/26/23 1100  TempSrc:   PainSc: 5                  Lowella Curb

## 2023-03-27 ENCOUNTER — Encounter (HOSPITAL_COMMUNITY): Payer: Self-pay | Admitting: Urology

## 2023-03-27 DIAGNOSIS — N401 Enlarged prostate with lower urinary tract symptoms: Secondary | ICD-10-CM | POA: Diagnosis not present

## 2023-03-27 DIAGNOSIS — R338 Other retention of urine: Secondary | ICD-10-CM | POA: Diagnosis not present

## 2023-03-27 DIAGNOSIS — N4889 Other specified disorders of penis: Secondary | ICD-10-CM | POA: Diagnosis not present

## 2023-03-27 DIAGNOSIS — D290 Benign neoplasm of penis: Secondary | ICD-10-CM | POA: Diagnosis not present

## 2023-03-27 DIAGNOSIS — D4959 Neoplasm of unspecified behavior of other genitourinary organ: Secondary | ICD-10-CM | POA: Diagnosis not present

## 2023-03-27 DIAGNOSIS — N138 Other obstructive and reflux uropathy: Secondary | ICD-10-CM | POA: Diagnosis not present

## 2023-03-27 LAB — CBC
HCT: 40.9 % (ref 39.0–52.0)
Hemoglobin: 13.3 g/dL (ref 13.0–17.0)
MCH: 29 pg (ref 26.0–34.0)
MCHC: 32.5 g/dL (ref 30.0–36.0)
MCV: 89.1 fL (ref 80.0–100.0)
Platelets: 99 10*3/uL — ABNORMAL LOW (ref 150–400)
RBC: 4.59 MIL/uL (ref 4.22–5.81)
RDW: 15.7 % — ABNORMAL HIGH (ref 11.5–15.5)
WBC: 3.9 10*3/uL — ABNORMAL LOW (ref 4.0–10.5)
nRBC: 0 % (ref 0.0–0.2)

## 2023-03-27 LAB — BASIC METABOLIC PANEL
Anion gap: 8 (ref 5–15)
BUN: 16 mg/dL (ref 8–23)
CO2: 26 mmol/L (ref 22–32)
Calcium: 8.4 mg/dL — ABNORMAL LOW (ref 8.9–10.3)
Chloride: 101 mmol/L (ref 98–111)
Creatinine, Ser: 0.88 mg/dL (ref 0.61–1.24)
GFR, Estimated: >60 mL/min (ref >60)
Glucose, Bld: 121 mg/dL — ABNORMAL HIGH (ref 70–99)
Potassium: 4 mmol/L (ref 3.5–5.1)
Sodium: 135 mmol/L (ref 135–145)

## 2023-03-27 MED ORDER — CHLORHEXIDINE GLUCONATE CLOTH 2 % EX PADS
6.0000 | MEDICATED_PAD | Freq: Every day | CUTANEOUS | Status: DC
  Administered 2023-03-27: 6 via TOPICAL

## 2023-03-27 NOTE — Discharge Summary (Addendum)
Date of admission: 03/26/2023  Date of discharge: 03/27/2023  Admission diagnosis: BPH, penile intraepithelial neoplasia  Discharge diagnosis: Same  Secondary diagnoses: none  History and Physical: For full details, please see admission history and physical. Briefly, Guy Moore is a 89 y.o. 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   Hospital Course: Patient underwent TURP and penile lesion CO2 laser ablation with Dr. Cardell Peach on 03/26/23. He tolerated the procedure well and post-operatively was transferred to the floor for routine post-operative care. He was maintained on CBI through the morning of POD#1 when CBI was clamped.   EXAM: NAD, Aox3 Clear respirations Urine remained clear yellow off of CBI.  His penile wound was healing well without concern for active bleeding or drainage.  Abdomen soft, ND, NT   By the monring of POD#1, patient's pain was well-controlled on PO pain medication, he was ambulating, and tolerating a regular diet. He was cleared for discharge with plans to continue foley catheter through follow-up.  Laboratory values:  Recent Labs    03/26/23 1059 03/27/23 0535  HGB 12.6* 13.3  HCT 38.5* 40.9   Recent Labs    03/26/23 1059 03/27/23 0535  CREATININE 1.02 0.88    Disposition: Home  Discharge instruction: The patient was instructed to be ambulatory but told to refrain from heavy lifting, strenuous activity, or driving. Continue to hold anticoagulation through Monday, 03/29/23. Bacitracin to penile lesion TID.  Discharge medications:  Allergies as of 03/27/2023   No Known Allergies      Medication List     TAKE these medications    albuterol 108 (90 Base) MCG/ACT inhaler Commonly known as: VENTOLIN HFA USE 2 INHALATIONS EVERY 6 HOURS AS NEEDED FOR WHEEZING OR SHORTNESS OF BREATH   amLODipine 5 MG tablet Commonly known as: NORVASC TAKE 1 TABLET DAILY   Besivance 0.6 % Susp Generic drug:  Besifloxacin HCl Place 1 drop into both eyes See admin instructions. Instill 1 drop into both eyes 3 times daily on the day of monthly eye injections then 4 times daily the day after   CITRUCEL PO Take 1-2 tablets by mouth daily as needed (constipation).   cyanocobalamin 1000 MCG/ML injection Commonly known as: VITAMIN B12 Inject 1 mL (1,000 mcg total) into the muscle every 30 (thirty) days.   docusate sodium 100 MG capsule Commonly known as: Colace Take 1 capsule (100 mg total) by mouth daily as needed for up to 30 doses.   fluticasone 50 MCG/ACT nasal spray Commonly known as: FLONASE Place 1 spray into both nostrils daily. What changed:  when to take this reasons to take this   fluticasone-salmeterol 250-50 MCG/ACT Aepb Commonly known as: ADVAIR Inhale 1 puff into the lungs in the morning and at bedtime.   hydrocortisone cream 1 % Apply 1 Application topically daily as needed for itching.   LUBRICATING EYE DROPS OP Place 1 drop into both eyes daily as needed (dry eyes).   MAGNESIUM GLUCONATE PO Take 1 tablet by mouth daily.   neomycin-bacitracin-polymyxin Oint Commonly known as: NEOSPORIN Apply 1 Application topically as needed for wound care.   omeprazole 40 MG capsule Commonly known as: PRILOSEC TAKE 1 CAPSULE DAILY What changed:  when to take this reasons to take this   oxyCODONE-acetaminophen 5-325 MG tablet Commonly known as: Percocet Take 1 tablet by mouth every 4 (four) hours as needed for up to 18 doses for severe pain.   potassium chloride SA 20 MEQ tablet Commonly known  as: KLOR-CON M TAKE 1 TABLET DAILY   pravastatin 40 MG tablet Commonly known as: PRAVACHOL TAKE 1 TABLET AT BEDTIME   PreserVision AREDS 2 Caps Take 1 capsule by mouth 2 (two) times daily.   tamsulosin 0.4 MG Caps capsule Commonly known as: FLOMAX Take 1 capsule (0.4 mg total) by mouth daily after supper.   valsartan 160 MG tablet Commonly known as: DIOVAN TAKE 1 TABLET  DAILY   Xarelto 20 MG Tabs tablet Generic drug: rivaroxaban TAKE 1 TABLET DAILY        Followup:   Follow-up Information     [facility] Follow up on 03/29/2023.   Why: 10:30AM Contact information: [address] [place] [address] [phone]

## 2023-03-27 NOTE — Progress Notes (Signed)
Transition of Care [facility]) - Inpatient Brief Assessment   Patient Details  Name: Guy Moore MRN: [id number] Date of Birth: 10-Jun-1934  [facility]) CM/SW Contact:    Adrian Prows, RN Phone Number: 03/27/2023, 10:45 AM   Clinical Narrative: Brief assessment completed.  Transition of Care Asessment: Insurance and Status: Insurance coverage has been reviewed Patient has primary care physician: Yes Home environment has been reviewed: yes Prior level of function:: independent Prior/Current Home Services: No current home services Social Determinants of Health Reivew: SDOH reviewed no interventions necessary Readmission risk has been reviewed: Yes Transition of care needs: no transition of care needs at this time

## 2023-03-29 LAB — SURGICAL PATHOLOGY

## 2023-04-02 ENCOUNTER — Ambulatory Visit (INDEPENDENT_AMBULATORY_CARE_PROVIDER_SITE_OTHER): Payer: Medicare Other

## 2023-04-02 DIAGNOSIS — E538 Deficiency of other specified B group vitamins: Secondary | ICD-10-CM

## 2023-04-02 MED ORDER — CYANOCOBALAMIN 1000 MCG/ML IJ SOLN
1000.0000 ug | Freq: Once | INTRAMUSCULAR | Status: AC
Start: 2023-04-02 — End: 2023-04-02
  Administered 2023-04-02: 1000 ug via INTRAMUSCULAR

## 2023-04-02 NOTE — Progress Notes (Signed)
Per orders of Dr. Clent Ridges, injection of Cyanocobalamin Inj. 1000 mcg/mL  given by Vickii Chafe on L deltoid.  Patient tolerated injection well.

## 2023-04-05 ENCOUNTER — Telehealth: Payer: Self-pay | Admitting: *Deleted

## 2023-04-05 NOTE — Progress Notes (Signed)
  Care Coordination   Note   04/05/2023 Name: Guy Moore MRN: [id number] DOB: 1934-05-03  Guy Moore is a 89 y.o. year old male who sees Nelwyn Salisbury, MD for primary care. I reached out to Deri Fuelling by phone today to offer care coordination services.  [name] was given information about Care Coordination services today including:   The Care Coordination services include support from the care team which includes your Nurse Coordinator, Clinical Social Worker, or Pharmacist.  The Care Coordination team is here to help remove barriers to the health concerns and goals most important to you. Care Coordination services are voluntary, and the patient may decline or stop services at any time by request to their care team member.   Care Coordination Consent Status: Patient agreed to services and verbal consent obtained.   Follow up plan:  Telephone appointment with care coordination team member scheduled for:  6/27  Encounter Outcome:  Pt. Scheduled  [facility] Coordination Care Guide  Direct Dial: [phone]

## 2023-04-08 ENCOUNTER — Ambulatory Visit: Payer: Self-pay

## 2023-04-08 NOTE — Patient Outreach (Signed)
  Care Coordination   Initial Visit Note   04/08/2023 Name: Guy Moore MRN: [id number] DOB: February 05, 1934  Guy Moore is a 89 y.o. year old male who sees Nelwyn Salisbury, MD for primary care. I spoke with  Guy Moore by phone today.  What matters to the patients health and wellness today?  Possibly getting catheter removed    Goals Addressed             This Visit's Progress    COMPLETED: Care Coordionation Activities-No follow up required       Care Coordination Interventions: Evaluation of current treatment plan related to urinary retention and patient's adherence to plan as established by provider Active listening / Reflection utilized  Discussed [facility] services and support. Assessed SDOH. Advised to discuss with primary care physician if services needed in the future.          SDOH assessments and interventions completed:  Yes     Care Coordination Interventions:  Yes, provided   Follow up plan: No further intervention required.   Encounter Outcome:  Pt. Visit Completed   Bary Leriche, RN, MSN [facility] Care Management Care Management Coordinator Direct Line [phone]

## 2023-04-08 NOTE — Patient Instructions (Signed)
Visit Information  Thank you for taking time to visit with me today. Please don't hesitate to contact me if I can be of assistance to you.   Following are the goals we discussed today:   Goals Addressed             This Visit's Progress    COMPLETED: Care Coordionation Activities-No follow up required       Care Coordination Interventions: Evaluation of current treatment plan related to urinary retention and patient's adherence to plan as established by provider Active listening / Reflection utilized  Discussed [facility] services and support. Assessed SDOH. Advised to discuss with primary care physician if services needed in the future.           If you are experiencing a Mental Health or Behavioral Health Crisis or need someone to talk to, please call the Suicide and Crisis Lifeline: 988   Patient verbalizes understanding of instructions and care plan provided today and agrees to view in MyChart. Active MyChart status and patient understanding of how to access instructions and care plan via MyChart confirmed with patient.     The patient has been provided with contact information for the care management team and has been advised to call with any health related questions or concerns.    Bary Leriche, RN, MSN [facility] Care Management Care Management Coordinator Direct Line [phone]

## 2023-04-19 ENCOUNTER — Encounter (INDEPENDENT_AMBULATORY_CARE_PROVIDER_SITE_OTHER): Payer: Medicare Other | Admitting: Ophthalmology

## 2023-04-19 ENCOUNTER — Other Ambulatory Visit: Payer: Self-pay

## 2023-04-19 DIAGNOSIS — H35033 Hypertensive retinopathy, bilateral: Secondary | ICD-10-CM

## 2023-04-19 DIAGNOSIS — I10 Essential (primary) hypertension: Secondary | ICD-10-CM | POA: Diagnosis not present

## 2023-04-19 DIAGNOSIS — H43813 Vitreous degeneration, bilateral: Secondary | ICD-10-CM

## 2023-04-19 DIAGNOSIS — D3131 Benign neoplasm of right choroid: Secondary | ICD-10-CM

## 2023-04-19 DIAGNOSIS — H353231 Exudative age-related macular degeneration, bilateral, with active choroidal neovascularization: Secondary | ICD-10-CM | POA: Diagnosis not present

## 2023-04-19 MED ORDER — FLUTICASONE-SALMETEROL 250-50 MCG/ACT IN AEPB: 1.0000 | INHALATION_SPRAY | Freq: Two times a day (BID) | RESPIRATORY_TRACT | 1 refills | Status: DC

## 2023-04-28 DIAGNOSIS — N401 Enlarged prostate with lower urinary tract symptoms: Secondary | ICD-10-CM | POA: Diagnosis not present

## 2023-04-28 DIAGNOSIS — R338 Other retention of urine: Secondary | ICD-10-CM | POA: Diagnosis not present

## 2023-05-04 ENCOUNTER — Ambulatory Visit (INDEPENDENT_AMBULATORY_CARE_PROVIDER_SITE_OTHER): Payer: Medicare Other | Admitting: *Deleted

## 2023-05-04 DIAGNOSIS — E538 Deficiency of other specified B group vitamins: Secondary | ICD-10-CM

## 2023-05-04 MED ORDER — CYANOCOBALAMIN 1000 MCG/ML IJ SOLN
1000.0000 ug | Freq: Once | INTRAMUSCULAR | Status: AC
Start: 2023-05-04 — End: 2023-05-04
  Administered 2023-05-04: 1000 ug via INTRAMUSCULAR

## 2023-05-04 NOTE — Progress Notes (Signed)
Per orders of Dr. Fry, injection of B12 given by Rachel Vereen. Patient tolerated injection well. 

## 2023-05-05 DIAGNOSIS — D3131 Benign neoplasm of right choroid: Secondary | ICD-10-CM | POA: Diagnosis not present

## 2023-05-05 DIAGNOSIS — D3141 Benign neoplasm of right ciliary body: Secondary | ICD-10-CM | POA: Diagnosis not present

## 2023-05-05 DIAGNOSIS — Z961 Presence of intraocular lens: Secondary | ICD-10-CM | POA: Diagnosis not present

## 2023-05-05 DIAGNOSIS — H353231 Exudative age-related macular degeneration, bilateral, with active choroidal neovascularization: Secondary | ICD-10-CM | POA: Diagnosis not present

## 2023-05-12 ENCOUNTER — Encounter (INDEPENDENT_AMBULATORY_CARE_PROVIDER_SITE_OTHER): Payer: Self-pay

## 2023-05-13 DIAGNOSIS — R338 Other retention of urine: Secondary | ICD-10-CM | POA: Diagnosis not present

## 2023-05-13 DIAGNOSIS — N401 Enlarged prostate with lower urinary tract symptoms: Secondary | ICD-10-CM | POA: Diagnosis not present

## 2023-05-13 DIAGNOSIS — R8271 Bacteriuria: Secondary | ICD-10-CM | POA: Diagnosis not present

## 2023-05-16 LAB — CUP PACEART REMOTE DEVICE CHECK
Battery Remaining Longevity: 91 mo
Battery Voltage: 2.98 V
Brady Statistic AP VP Percent: 0 %
Brady Statistic AP VS Percent: 0 %
Brady Statistic AS VP Percent: 89.34 %
Brady Statistic AS VS Percent: 10.66 %
Brady Statistic RA Percent Paced: 0 %
Brady Statistic RV Percent Paced: 89.34 %
Date Time Interrogation Session: 20240804013150
Implantable Lead Connection Status: 753985
Implantable Lead Connection Status: 753985
Implantable Lead Implant Date: 20180124
Implantable Lead Implant Date: 20180124
Implantable Lead Location: 753859
Implantable Lead Location: 753860
Implantable Lead Model: 5076
Implantable Lead Model: 5076
Implantable Pulse Generator Implant Date: 20180124
Lead Channel Impedance Value: 323 Ohm
Lead Channel Impedance Value: 323 Ohm
Lead Channel Impedance Value: 399 Ohm
Lead Channel Impedance Value: 456 Ohm
Lead Channel Pacing Threshold Amplitude: 0.75 V
Lead Channel Pacing Threshold Pulse Width: 0.4 ms
Lead Channel Sensing Intrinsic Amplitude: 0.25 mV
Lead Channel Sensing Intrinsic Amplitude: 0.25 mV
Lead Channel Sensing Intrinsic Amplitude: 5.75 mV
Lead Channel Sensing Intrinsic Amplitude: 5.75 mV
Lead Channel Setting Pacing Amplitude: 1.5 V
Lead Channel Setting Pacing Pulse Width: 0.4 ms
Lead Channel Setting Sensing Sensitivity: 0.9 mV
Zone Setting Status: 755011

## 2023-05-17 ENCOUNTER — Ambulatory Visit: Payer: Medicare Other

## 2023-05-17 DIAGNOSIS — I4819 Other persistent atrial fibrillation: Secondary | ICD-10-CM

## 2023-05-24 ENCOUNTER — Encounter (INDEPENDENT_AMBULATORY_CARE_PROVIDER_SITE_OTHER): Payer: Medicare Other | Admitting: Ophthalmology

## 2023-05-24 DIAGNOSIS — H35033 Hypertensive retinopathy, bilateral: Secondary | ICD-10-CM

## 2023-05-24 DIAGNOSIS — L309 Dermatitis, unspecified: Secondary | ICD-10-CM | POA: Diagnosis not present

## 2023-05-24 DIAGNOSIS — H353231 Exudative age-related macular degeneration, bilateral, with active choroidal neovascularization: Secondary | ICD-10-CM

## 2023-05-24 DIAGNOSIS — H43813 Vitreous degeneration, bilateral: Secondary | ICD-10-CM

## 2023-05-24 DIAGNOSIS — I10 Essential (primary) hypertension: Secondary | ICD-10-CM

## 2023-05-24 DIAGNOSIS — D3131 Benign neoplasm of right choroid: Secondary | ICD-10-CM | POA: Diagnosis not present

## 2023-05-24 DIAGNOSIS — L989 Disorder of the skin and subcutaneous tissue, unspecified: Secondary | ICD-10-CM | POA: Diagnosis not present

## 2023-05-24 DIAGNOSIS — L570 Actinic keratosis: Secondary | ICD-10-CM | POA: Diagnosis not present

## 2023-05-27 ENCOUNTER — Ambulatory Visit (INDEPENDENT_AMBULATORY_CARE_PROVIDER_SITE_OTHER): Payer: Medicare Other | Admitting: Family Medicine

## 2023-05-27 ENCOUNTER — Encounter: Payer: Self-pay | Admitting: Family Medicine

## 2023-05-27 VITALS — BP 128/70 | HR 80 | Temp 97.6°F | Wt 223.0 lb

## 2023-05-27 DIAGNOSIS — I10 Essential (primary) hypertension: Secondary | ICD-10-CM | POA: Diagnosis not present

## 2023-05-27 DIAGNOSIS — N401 Enlarged prostate with lower urinary tract symptoms: Secondary | ICD-10-CM | POA: Diagnosis not present

## 2023-05-27 DIAGNOSIS — R531 Weakness: Secondary | ICD-10-CM | POA: Diagnosis not present

## 2023-05-27 DIAGNOSIS — N3943 Post-void dribbling: Secondary | ICD-10-CM | POA: Diagnosis not present

## 2023-05-27 DIAGNOSIS — I480 Paroxysmal atrial fibrillation: Secondary | ICD-10-CM

## 2023-05-27 DIAGNOSIS — R21 Rash and other nonspecific skin eruption: Secondary | ICD-10-CM

## 2023-05-27 DIAGNOSIS — Z87440 Personal history of urinary (tract) infections: Secondary | ICD-10-CM | POA: Diagnosis not present

## 2023-05-27 DIAGNOSIS — R8271 Bacteriuria: Secondary | ICD-10-CM | POA: Diagnosis not present

## 2023-05-27 NOTE — Progress Notes (Signed)
   Subjective:    Patient ID: Guy Moore, male    DOB: 12-Mar-1934, 89 y.o.   MRN: [id number]  HPI Here for several issues. He sees Dr.Matthew Cardell Peach for urologic care, and he performed a TURP on 03-26-23. Since then Guy Moore has had intermittent urinary incontinence so he wears protective undergarments. He complains because his sleep is interrupted every 2-3 hours by having to get up to urinate. There is no pain or burning or fever. He has had several UTI's recently. On 04-28-23 he was given a course of Doxycycline, but no urine culture was done. Then on 05-17-23 he was given a course of Cefdinir. This time a culture was done and it showed no growth. He will return to urology later today for a follow up UA. He also developed an itchy rash in both armpits and under his abdomen. He saw his dermatologist, Dr. Zannie Kehr, a few days ago, and he was not sure what was causing this. He gave him Triamcinolone cream to try, and this has helped somewhat. Dr. Wallace Cullens took a punch biopsy from the left axilla, and these results are pending. Guy Moore asks my opinion about the rash, and he also complains about generalized fatigue. He asks why this is happening. He had labs done in June which showed normal renal function, and no signs of anemia or diabetes.    Review of Systems  Constitutional:  Positive for fatigue. Negative for diaphoresis and fever.  Respiratory: Negative.    Cardiovascular: Negative.   Gastrointestinal: Negative.   Genitourinary:  Negative for dysuria, flank pain, hematuria and urgency.  Neurological: Negative.        Objective:   Physical Exam Constitutional:      Appearance: Normal appearance.  Cardiovascular:     Rate and Rhythm: Normal rate. Rhythm irregular.     Pulses: Normal pulses.     Heart sounds: Normal heart sounds.  Pulmonary:     Effort: Pulmonary effort is normal.     Breath sounds: Normal breath sounds.  Abdominal:     Tenderness: There is no right CVA tenderness or left CVA  tenderness.  Musculoskeletal:     Comments: Trace edema in both ankles   Skin:    Comments: There patches of red macular skin on the lower abdomen and in both axillae   Neurological:     Mental Status: He is alert.           Assessment & Plan:  He has been dealing with several UTI's recently, and I think his weakness is due to his body fighting these infections along with him not getting good sleep. Hopefully the infections have resolved, and hopefully his urinary incontinence will improve as time passes after his TURP. As for the rash, it looks like classic Candidiasis from all his recent antibiotic use. Rather than treat this with an antifungal medication, I advised him to wait until he hears the result of the biopsy that Dr. Wallace Cullens performed.  Gershon Crane, MD

## 2023-06-01 NOTE — Progress Notes (Signed)
Remote pacemaker transmission.   

## 2023-06-04 ENCOUNTER — Ambulatory Visit (INDEPENDENT_AMBULATORY_CARE_PROVIDER_SITE_OTHER): Payer: Medicare Other

## 2023-06-04 DIAGNOSIS — E538 Deficiency of other specified B group vitamins: Secondary | ICD-10-CM | POA: Diagnosis not present

## 2023-06-04 MED ORDER — CYANOCOBALAMIN 1000 MCG/ML IJ SOLN
1000.0000 ug | Freq: Once | INTRAMUSCULAR | Status: AC
Start: 2023-06-04 — End: 2023-06-04
  Administered 2023-06-04: 1000 ug via INTRAMUSCULAR

## 2023-06-04 NOTE — Progress Notes (Signed)
Per orders of Shirline Frees, NP, injection of B12 given by Vickii Chafe on L arm. Patient tolerated injection well.

## 2023-06-07 DIAGNOSIS — D3131 Benign neoplasm of right choroid: Secondary | ICD-10-CM | POA: Diagnosis not present

## 2023-06-07 DIAGNOSIS — H02831 Dermatochalasis of right upper eyelid: Secondary | ICD-10-CM | POA: Diagnosis not present

## 2023-06-07 DIAGNOSIS — H6123 Impacted cerumen, bilateral: Secondary | ICD-10-CM | POA: Diagnosis not present

## 2023-06-07 DIAGNOSIS — H353231 Exudative age-related macular degeneration, bilateral, with active choroidal neovascularization: Secondary | ICD-10-CM | POA: Diagnosis not present

## 2023-06-07 DIAGNOSIS — H903 Sensorineural hearing loss, bilateral: Secondary | ICD-10-CM | POA: Diagnosis not present

## 2023-06-07 DIAGNOSIS — D3191 Benign neoplasm of unspecified part of right eye: Secondary | ICD-10-CM | POA: Diagnosis not present

## 2023-06-07 DIAGNOSIS — Z961 Presence of intraocular lens: Secondary | ICD-10-CM | POA: Diagnosis not present

## 2023-06-07 DIAGNOSIS — H02834 Dermatochalasis of left upper eyelid: Secondary | ICD-10-CM | POA: Diagnosis not present

## 2023-06-07 DIAGNOSIS — H04123 Dry eye syndrome of bilateral lacrimal glands: Secondary | ICD-10-CM | POA: Diagnosis not present

## 2023-06-08 DIAGNOSIS — L304 Erythema intertrigo: Secondary | ICD-10-CM | POA: Diagnosis not present

## 2023-06-08 DIAGNOSIS — L0292 Furuncle, unspecified: Secondary | ICD-10-CM | POA: Diagnosis not present

## 2023-06-08 DIAGNOSIS — Z4802 Encounter for removal of sutures: Secondary | ICD-10-CM | POA: Diagnosis not present

## 2023-06-15 ENCOUNTER — Ambulatory Visit: Payer: Medicare Other | Admitting: Family Medicine

## 2023-06-15 DIAGNOSIS — L270 Generalized skin eruption due to drugs and medicaments taken internally: Secondary | ICD-10-CM | POA: Diagnosis not present

## 2023-06-23 ENCOUNTER — Encounter (INDEPENDENT_AMBULATORY_CARE_PROVIDER_SITE_OTHER): Payer: Medicare Other | Admitting: Ophthalmology

## 2023-06-23 DIAGNOSIS — H43813 Vitreous degeneration, bilateral: Secondary | ICD-10-CM | POA: Diagnosis not present

## 2023-06-23 DIAGNOSIS — H353231 Exudative age-related macular degeneration, bilateral, with active choroidal neovascularization: Secondary | ICD-10-CM

## 2023-06-23 DIAGNOSIS — H35033 Hypertensive retinopathy, bilateral: Secondary | ICD-10-CM

## 2023-06-23 DIAGNOSIS — I10 Essential (primary) hypertension: Secondary | ICD-10-CM

## 2023-06-23 DIAGNOSIS — D3131 Benign neoplasm of right choroid: Secondary | ICD-10-CM | POA: Diagnosis not present

## 2023-06-29 DIAGNOSIS — L270 Generalized skin eruption due to drugs and medicaments taken internally: Secondary | ICD-10-CM | POA: Diagnosis not present

## 2023-07-05 ENCOUNTER — Ambulatory Visit: Payer: Medicare Other

## 2023-07-06 ENCOUNTER — Ambulatory Visit: Payer: Medicare Other

## 2023-07-06 DIAGNOSIS — Z23 Encounter for immunization: Secondary | ICD-10-CM

## 2023-07-06 DIAGNOSIS — E538 Deficiency of other specified B group vitamins: Secondary | ICD-10-CM

## 2023-07-06 MED ORDER — CYANOCOBALAMIN 1000 MCG/ML IJ SOLN
1000.0000 ug | Freq: Once | INTRAMUSCULAR | Status: AC
Start: 2023-07-06 — End: 2023-07-06
  Administered 2023-07-06: 1000 ug via INTRAMUSCULAR

## 2023-07-06 NOTE — Progress Notes (Signed)
Per orders of Dr. Clent Ridges , injection of B-12 and Flu Vaccine  given by Stann Ore. Patient tolerated injection well.

## 2023-07-27 ENCOUNTER — Encounter (INDEPENDENT_AMBULATORY_CARE_PROVIDER_SITE_OTHER): Payer: Medicare Other | Admitting: Ophthalmology

## 2023-07-27 DIAGNOSIS — D3131 Benign neoplasm of right choroid: Secondary | ICD-10-CM | POA: Diagnosis not present

## 2023-07-27 DIAGNOSIS — H353231 Exudative age-related macular degeneration, bilateral, with active choroidal neovascularization: Secondary | ICD-10-CM

## 2023-07-27 DIAGNOSIS — H43813 Vitreous degeneration, bilateral: Secondary | ICD-10-CM | POA: Diagnosis not present

## 2023-07-27 DIAGNOSIS — I10 Essential (primary) hypertension: Secondary | ICD-10-CM

## 2023-07-27 DIAGNOSIS — H35033 Hypertensive retinopathy, bilateral: Secondary | ICD-10-CM | POA: Diagnosis not present

## 2023-07-28 DIAGNOSIS — D074 Carcinoma in situ of penis: Secondary | ICD-10-CM | POA: Diagnosis not present

## 2023-07-28 DIAGNOSIS — N401 Enlarged prostate with lower urinary tract symptoms: Secondary | ICD-10-CM | POA: Diagnosis not present

## 2023-07-28 DIAGNOSIS — R3915 Urgency of urination: Secondary | ICD-10-CM | POA: Diagnosis not present

## 2023-08-05 ENCOUNTER — Ambulatory Visit: Payer: Medicare Other

## 2023-08-05 DIAGNOSIS — E538 Deficiency of other specified B group vitamins: Secondary | ICD-10-CM | POA: Diagnosis not present

## 2023-08-05 MED ORDER — CYANOCOBALAMIN 1000 MCG/ML IJ SOLN
1000.0000 ug | Freq: Once | INTRAMUSCULAR | Status: AC
Start: 2023-08-05 — End: 2023-08-05
  Administered 2023-08-05: 1000 ug via INTRAMUSCULAR

## 2023-08-05 NOTE — Progress Notes (Signed)
Per orders of Dr. Clent Ridges, injection of B12 given by Vickii Chafe. Patient tolerated injection well.

## 2023-08-09 ENCOUNTER — Ambulatory Visit (INDEPENDENT_AMBULATORY_CARE_PROVIDER_SITE_OTHER): Payer: Medicare Other | Admitting: Family Medicine

## 2023-08-09 ENCOUNTER — Encounter: Payer: Self-pay | Admitting: Family Medicine

## 2023-08-09 VITALS — BP 124/64 | HR 71 | Temp 97.8°F | Wt 230.0 lb

## 2023-08-09 DIAGNOSIS — M48061 Spinal stenosis, lumbar region without neurogenic claudication: Secondary | ICD-10-CM

## 2023-08-09 MED ORDER — IMIQUIMOD 5 % EX CREA: TOPICAL_CREAM | CUTANEOUS | Status: DC

## 2023-08-09 NOTE — Progress Notes (Signed)
Subjective:    Patient ID: OWAIN WARK, male    DOB: [birth year], 89 y.o.   MRN: [id number]  HPI Here for several weeks of right sided low back pain that radiates down the right buttock and thigh. No weakness or numbness. He had an MRI in 2017 that revealed a moderate to severe stenosis at L4-5. He takes Tylenol as needed.    Review of Systems  Constitutional: Negative.   Respiratory: Negative.    Cardiovascular: Negative.   Musculoskeletal:  Positive for back pain.       Objective:   Physical Exam Constitutional:      General: He is not in acute distress.    Appearance: Normal appearance.  Cardiovascular:     Rate and Rhythm: Normal rate and regular rhythm.     Pulses: Normal pulses.     Heart sounds: Normal heart sounds.  Pulmonary:     Effort: Pulmonary effort is normal.     Breath sounds: Normal breath sounds.  Musculoskeletal:     Comments: Tender over the lower spine and just to the right of the spine at L4-5. ROM is full.   Neurological:     Mental Status: He is alert.           Assessment & Plan:  Lumbar stenosis. We will send him for PT. Follow up as needed.  Gershon Crane, MD

## 2023-08-16 ENCOUNTER — Ambulatory Visit (INDEPENDENT_AMBULATORY_CARE_PROVIDER_SITE_OTHER): Payer: Medicare Other

## 2023-08-16 DIAGNOSIS — I4819 Other persistent atrial fibrillation: Secondary | ICD-10-CM

## 2023-08-16 LAB — CUP PACEART REMOTE DEVICE CHECK
Battery Remaining Longevity: 87 mo
Battery Voltage: 2.98 V
Brady Statistic AP VP Percent: 0 %
Brady Statistic AP VS Percent: 0 %
Brady Statistic AS VP Percent: 85.7 %
Brady Statistic AS VS Percent: 14.3 %
Brady Statistic RA Percent Paced: 0 %
Brady Statistic RV Percent Paced: 85.7 %
Date Time Interrogation Session: 20241102215521
Implantable Lead Connection Status: 753985
Implantable Lead Connection Status: 753985
Implantable Lead Implant Date: 20180124
Implantable Lead Implant Date: 20180124
Implantable Lead Location: 753859
Implantable Lead Location: 753860
Implantable Lead Model: 5076
Implantable Lead Model: 5076
Implantable Pulse Generator Implant Date: 20180124
Lead Channel Impedance Value: 323 Ohm
Lead Channel Impedance Value: 323 Ohm
Lead Channel Impedance Value: 418 Ohm
Lead Channel Impedance Value: 456 Ohm
Lead Channel Pacing Threshold Amplitude: 0.875 V
Lead Channel Pacing Threshold Pulse Width: 0.4 ms
Lead Channel Sensing Intrinsic Amplitude: 0.25 mV
Lead Channel Sensing Intrinsic Amplitude: 0.25 mV
Lead Channel Sensing Intrinsic Amplitude: 7.5 mV
Lead Channel Sensing Intrinsic Amplitude: 7.5 mV
Lead Channel Setting Pacing Amplitude: 1.75 V
Lead Channel Setting Pacing Pulse Width: 0.4 ms
Lead Channel Setting Sensing Sensitivity: 0.9 mV
Zone Setting Status: 755011

## 2023-08-17 DIAGNOSIS — R233 Spontaneous ecchymoses: Secondary | ICD-10-CM | POA: Diagnosis not present

## 2023-08-17 DIAGNOSIS — L270 Generalized skin eruption due to drugs and medicaments taken internally: Secondary | ICD-10-CM | POA: Diagnosis not present

## 2023-08-17 DIAGNOSIS — L728 Other follicular cysts of the skin and subcutaneous tissue: Secondary | ICD-10-CM | POA: Diagnosis not present

## 2023-08-17 DIAGNOSIS — L570 Actinic keratosis: Secondary | ICD-10-CM | POA: Diagnosis not present

## 2023-08-22 NOTE — Therapy (Unsigned)
OUTPATIENT PHYSICAL THERAPY THORACOLUMBAR EVALUATION   Patient Name: Guy Moore MRN: [id number] DOB:July 06, 1934, 89 y.o., male Today's Date: 08/23/2023  END OF SESSION:  PT End of Session - 08/23/23 0827     Visit Number 1    Number of Visits 12    Date for PT Re-Evaluation 10/23/23    Authorization Type MCR    Progress Note Due on Visit 10    PT Start Time 0745    PT Stop Time 0830    PT Time Calculation (min) 45 min    Activity Tolerance Patient tolerated treatment well;Patient limited by pain    Behavior During Therapy WFL for tasks assessed/performed             Past Medical History:  Diagnosis Date   Allergic rhinitis    Arthritis    Asthma    Atrial fibrillation (HCC)    sees Dr. Bedelia Person at [facility]    Atrial flutter (HCC)    BCC (basal cell carcinoma of skin)    Nose   Chronic kidney disease    Complication of anesthesia    no problems per patient   COPD with emphysema (HCC)    sees Dr. Coralyn Helling    Diverticulosis    Dysrhythmia    afib, flutter and tachycardia   Erosive esophagitis    Esophageal stricture    Gastric ulcer    Hearing loss    uses amplification   Hemorrhoids    Hiatal hernia    History of kidney stones    HTN (hypertension)    Hyperlipidemia    Knee problem    2% permanent partial impairment Right   Macular degeneration    Perineal abscess    Pneumonia    Pre-diabetes    Presence of permanent cardiac pacemaker    Sleep apnea    cpap   Past Surgical History:  Procedure Laterality Date   CARDIAC ELECTROPHYSIOLOGY STUDY AND ABLATION     x 4 (sees [facility] at [facility])   CO2 LASER APPLICATION N/A 03/26/2023   Procedure: CO2 LASER ABLATION OF PENIS;  Surgeon: Jannifer Hick, MD;  Location: [facility];  Service: Urology;  Laterality: N/A;   CYST EXCISION PERINEAL     CYSTOSCOPY WITH URETEROSCOPY AND STENT PLACEMENT Right 10/29/2022   Procedure: CYSTOSCOPY WITH RETROGRADE WITH URETHERAL  DILITATIONRIGHT DIAGNOSTIC  URETEROSCOPY AND STENT PLACEMENT;  Surgeon: Jannifer Hick, MD;  [facility];  Service: Urology;  Laterality: Right;   CYSTOSCOPY/URETEROSCOPY/HOLMIUM LASER/STENT PLACEMENT Right 11/27/2022   Procedure: CYSTOSCOPY/ RIGHT RETROGRADE PYELOGRAM/RIGHT HOLMIUM LASER/RIGHT STENT PLACEMENT/BASKET;  Surgeon: Jannifer Hick, MD;  [facility];  Service: Urology;  Laterality: Right;  60 MINUTES NEEDED FOR CASE   INSERT / REPLACE / REMOVE PACEMAKER     NOSE SURGERY     septoplasty   PENILE BIOPSY N/A 11/27/2022   Procedure: PENILE BIOPSY;  Surgeon: Jannifer Hick, MD;  [facility];  Service: Urology;  Laterality: N/A;   TRANSURETHRAL RESECTION OF PROSTATE N/A 03/26/2023   Procedure: BIPOLAR TRANSURETHRAL RESECTION OF THE PROSTATE (TURP);  Surgeon: Jannifer Hick, MD;  [facility];  Service: Urology;  Laterality: N/A;  90 MINUTES NEEDED FOR CASE   UVULOPALATOPHARYNGOPLASTY  06/13/1979   VASECTOMY  06/12/1969   w/complications   Patient Active Problem List   Diagnosis Date Noted   BPH (benign prostatic hyperplasia) 03/26/2023   Urinary retention 11/04/2022   Ureteral calculus, right 11/04/2022  History of UTI 11/04/2022   Hydronephrosis 11/04/2022   Perineal abscess 08/04/2021   CAP (community acquired pneumonia) 05/25/2021   Dyspnea 05/25/2021   Bicytopenia 08/06/2020   B12 deficiency 05/06/2020   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   Asthma 08/26/2016   Hyperglycemia 05/29/2016   Thoracic scoliosis 12/16/2015   Thrombocytopenia (HCC) 04/12/2015   History of basal cell cancer 06/27/2014   GERD (gastroesophageal reflux disease) 08/31/2011   Anticoagulant long-term use 08/31/2011   OBSTRUCTIVE SLEEP APNEA 07/04/2009   Atrial fibrillation (HCC) 04/19/2009   Allergic rhinitis 04/14/2007   Hyperlipidemia 04/13/2007   Essential hypertension 04/13/2007    PCP: Nelwyn Salisbury, MD  REFERRING PROVIDER: Nelwyn Salisbury, MD  REFERRING DIAG: [phone]  (ICD-10-CM) - Spinal stenosis of lumbar region without neurogenic claudication  Rationale for Evaluation and Treatment: Rehabilitation  THERAPY DIAG:  Other low back pain  Spinal stenosis of lumbar region, unspecified whether neurogenic claudication present  Muscle weakness (generalized)  ONSET DATE: 6-8 weeks  SUBJECTIVE:                                                                                                                                                                                           SUBJECTIVE STATEMENT: Here for several weeks of right sided low back pain that radiates down the right buttock and thigh. No weakness or numbness. He had an MRI in 2017 that revealed a moderate to severe stenosis at L4-5. He takes Tylenol as needed. He describes R sided low back pain which can radiate to R thigh.     PERTINENT HISTORY:  Here for several weeks of right sided low back pain that radiates down the right buttock and thigh. No weakness or numbness. He had an MRI in 2017 that revealed a moderate to severe stenosis at L4-5. He takes Tylenol as needed.   PAIN:  Are you having pain? Yes: NPRS scale: 8/10 Pain location: R low back Pain description: ache Aggravating factors: lifting, prolonged positioning Relieving factors: position changes , lying supine  PRECAUTIONS: None  RED FLAGS: None   WEIGHT BEARING RESTRICTIONS: No  FALLS:  Has patient fallen in last 6 months? No  OCCUPATION: retired  PLOF: Independent  PATIENT GOALS: To manage my back pain  NEXT MD VISIT: PRN  OBJECTIVE:  Note: Objective measures were completed at Evaluation unless otherwise noted.  DIAGNOSTIC FINDINGS:  IMPRESSION: 1. Nondisplaced posterior left eighth rib fracture. 2. Otherwise no acute osseous abnormality in the thoracolumbar spine or visible sacrum. 3. Overall mild for age thoracic and lumbar spine degeneration. No thoracic spinal stenosis. There is moderate to severe  degenerative lumbar spinal  stenosis at L4-L5, primarily due to severe posterior element degeneration.     Electronically Signed   By: Odessa Fleming M.D.   On: 02/02/2016 10:39  PATIENT SURVEYS:  FOTO 30(52 predicted)  MUSCLE LENGTH: Hamstrings: Right 70 deg; Left 70 deg  POSTURE: rounded shoulders, decreased lumbar lordosis, and increased thoracic kyphosis  PALPATION: TTP R lumbar paraspinals   LUMBAR ROM: Deferred due to dizziness with movement  AROM eval  Flexion   Extension   Right lateral flexion   Left lateral flexion   Right rotation   Left rotation    (Blank rows = not tested)  LOWER EXTREMITY ROM:   WFL for gait and transfers  Active  Right eval Left eval  Hip flexion    Hip extension    Hip abduction    Hip adduction    Hip internal rotation    Hip external rotation    Knee flexion    Knee extension    Ankle dorsiflexion    Ankle plantarflexion    Ankle inversion    Ankle eversion     (Blank rows = not tested)  LOWER EXTREMITY MMT:    MMT Right eval Left eval  Hip flexion 4- 4-  Hip extension 4- 4-  Hip abduction 4- 4-  Hip adduction    Hip internal rotation    Hip external rotation    Knee flexion 4- 4-  Knee extension 4- 4-  Ankle dorsiflexion    Ankle plantarflexion 4- 4-  Ankle inversion    Ankle eversion     (Blank rows = not tested)  LUMBAR SPECIAL TESTS:  Straight leg raise test: Negative and Slump test: Negative  FUNCTIONAL TESTS:  30 seconds chair stand test 8 stands  GAIT: Distance walked: 63ft x2 Assistive device utilized: None Level of assistance: Complete Independence Comments: slow cadence  TODAY'S TREATMENT:                                                                                                                              DATE: 08/23/23 Eval    PATIENT EDUCATION:  Education details: Discussed eval findings, rehab rationale and POC and patient is in agreement  Person educated: Patient Education method:  Explanation Education comprehension: verbalized understanding and needs further education  HOME EXERCISE PROGRAM: Access Code: [email or web address] URL: [email or web address] Date: 08/23/2023 Prepared by: Gustavus Bryant  Exercises - Sit to Stand with Armchair  - 2 x daily - 5 x weekly - 1 sets - 5 reps  ASSESSMENT:  CLINICAL IMPRESSION: Patient is a 89 y.o. male who was seen today for physical therapy evaluation and treatment for chronic low back pain due to underlying stenosis.  Patient presents with general deconditioning, slow cadence with gait, flexed spine and rounded shoulder posture.  Trunk mobility limited due to apprehension regarding movement as patient experiences episodes of dizziness.  Nerve tension testing negative and hip ROM and mobility testing do not aggravate back symptoms.  Patient presents with marked tenderness to R lumbar paraspinals and lumbosacral fascia.  OBJECTIVE IMPAIRMENTS: Abnormal gait, decreased activity tolerance, decreased endurance, decreased knowledge of condition, difficulty walking, decreased strength, improper body mechanics, postural dysfunction, obesity, and pain.   ACTIVITY LIMITATIONS: carrying, lifting, bending, standing, squatting, and stairs  PERSONAL FACTORS: Age, Fitness, Time since onset of injury/illness/exacerbation, and 1 comorbidity: underlying stenosis  are also affecting patient's functional outcome.   REHAB POTENTIAL: Good  CLINICAL DECISION MAKING: Stable/uncomplicated  EVALUATION COMPLEXITY: Low   GOALS: Goals reviewed with patient? No  SHORT TERM GOALS: Target date: 09/13/2023    Patient to demonstrate independence in HEP  Baseline: [id number] Goal status: INITIAL  2.  Patient to stand from chair w/o need of UE Support Baseline: Needs BUE support Goal status: INITIAL  3.  Assess 2 MWT and establish goal Baseline: TBD Goal status: INITIAL    LONG TERM GOALS: Target date: 10/04/2023    Patient will score at  least 52% on FOTO to signify clinically meaningful improvement in functional abilities.   Baseline: 30% Goal status: INITIAL  2.  Patient will increase 30s chair stand reps from 8 to 10 with/without arms to demonstrate and improved functional ability with less pain/difficulty as well as reduce fall risk.  Baseline: 8 Goal status: INITIAL  3.  Patient will acknowledge 4/10 pain at least once during episode of care  Baseline: 8/10 Goal status: INITIAL  4.  4/5 BLE strength throughout Baseline:  MMT Right eval Left eval  Hip flexion 4- 4-  Hip extension 4- 4-  Hip abduction 4- 4-  Hip adduction    Hip internal rotation    Hip external rotation    Knee flexion 4- 4-  Knee extension 4- 4-  Ankle dorsiflexion    Ankle plantarflexion 4- 4-   Goal status: INITIAL  5.  Assess progress on 2 MWT Baseline: TBD Goal status: INITIAL  6.  Decrease tenderness to R lumbar paraspinals to minimal Baseline: Moderate tenderness Goal status: INITIAL  PLAN:  PT FREQUENCY: 1-2x/week  PT DURATION: 6 weeks  PLANNED INTERVENTIONS: 97164- PT Re-evaluation, 97110-Therapeutic exercises, 97530- Therapeutic activity, 97112- Neuromuscular re-education, 97535- Self Care, 38756- Manual therapy, [phone]- Gait training, [phone]- Electrical stimulation (unattended), Patient/Family education, Balance training, Stair training, Dry Needling, Joint mobilization, Spinal mobilization, Cryotherapy, and Moist heat.  PLAN FOR NEXT SESSION: HEP review and update, manual techniques as appropriate, aerobic tasks, ROM and flexibility activities, strengthening and PREs, TPDN, gait and balance training as needed     Hildred Laser, PT 08/23/2023, 8:32 AM

## 2023-08-23 ENCOUNTER — Other Ambulatory Visit: Payer: Self-pay

## 2023-08-23 ENCOUNTER — Ambulatory Visit: Payer: Medicare Other | Attending: Family Medicine

## 2023-08-23 DIAGNOSIS — M6281 Muscle weakness (generalized): Secondary | ICD-10-CM | POA: Diagnosis not present

## 2023-08-23 DIAGNOSIS — M48061 Spinal stenosis, lumbar region without neurogenic claudication: Secondary | ICD-10-CM

## 2023-08-23 DIAGNOSIS — M5459 Other low back pain: Secondary | ICD-10-CM | POA: Diagnosis not present

## 2023-08-30 ENCOUNTER — Encounter (INDEPENDENT_AMBULATORY_CARE_PROVIDER_SITE_OTHER): Payer: Medicare Other | Admitting: Ophthalmology

## 2023-08-30 DIAGNOSIS — H353231 Exudative age-related macular degeneration, bilateral, with active choroidal neovascularization: Secondary | ICD-10-CM | POA: Diagnosis not present

## 2023-08-30 DIAGNOSIS — H35033 Hypertensive retinopathy, bilateral: Secondary | ICD-10-CM | POA: Diagnosis not present

## 2023-08-30 DIAGNOSIS — H43813 Vitreous degeneration, bilateral: Secondary | ICD-10-CM

## 2023-08-30 DIAGNOSIS — D3131 Benign neoplasm of right choroid: Secondary | ICD-10-CM

## 2023-08-30 DIAGNOSIS — I10 Essential (primary) hypertension: Secondary | ICD-10-CM | POA: Diagnosis not present

## 2023-08-31 ENCOUNTER — Inpatient Hospital Stay: Payer: Medicare Other | Attending: Hematology and Oncology

## 2023-08-31 ENCOUNTER — Inpatient Hospital Stay (HOSPITAL_BASED_OUTPATIENT_CLINIC_OR_DEPARTMENT_OTHER): Payer: Medicare Other | Admitting: Internal Medicine

## 2023-08-31 VITALS — BP 145/92 | HR 77 | Temp 98.6°F | Resp 15 | Ht 70.0 in | Wt 227.5 lb

## 2023-08-31 DIAGNOSIS — D696 Thrombocytopenia, unspecified: Secondary | ICD-10-CM

## 2023-08-31 DIAGNOSIS — D469 Myelodysplastic syndrome, unspecified: Secondary | ICD-10-CM | POA: Insufficient documentation

## 2023-08-31 DIAGNOSIS — Z79899 Other long term (current) drug therapy: Secondary | ICD-10-CM | POA: Insufficient documentation

## 2023-08-31 DIAGNOSIS — Z87440 Personal history of urinary (tract) infections: Secondary | ICD-10-CM | POA: Diagnosis not present

## 2023-08-31 DIAGNOSIS — N401 Enlarged prostate with lower urinary tract symptoms: Secondary | ICD-10-CM | POA: Insufficient documentation

## 2023-08-31 LAB — CMP (CANCER CENTER ONLY)
ALT: 19 U/L (ref 0–44)
AST: 21 U/L (ref 15–41)
Albumin: 4 g/dL (ref 3.5–5.0)
Alkaline Phosphatase: 51 U/L (ref 38–126)
Anion gap: 5 (ref 5–15)
BUN: 20 mg/dL (ref 8–23)
CO2: 29 mmol/L (ref 22–32)
Calcium: 8.9 mg/dL (ref 8.9–10.3)
Chloride: 104 mmol/L (ref 98–111)
Creatinine: 1.01 mg/dL (ref 0.61–1.24)
GFR, Estimated: >60 mL/min (ref >60)
Glucose, Bld: 82 mg/dL (ref 70–99)
Potassium: 4.5 mmol/L (ref 3.5–5.1)
Sodium: 138 mmol/L (ref 135–145)
Total Bilirubin: 0.7 mg/dL (ref <1.2)
Total Protein: 6.2 g/dL — ABNORMAL LOW (ref 6.5–8.1)

## 2023-08-31 LAB — CBC WITH DIFFERENTIAL (CANCER CENTER ONLY)
Abs Immature Granulocytes: 0.01 10*3/uL (ref 0.00–0.07)
Basophils Absolute: 0 10*3/uL (ref 0.0–0.1)
Basophils Relative: 1 %
Eosinophils Absolute: 0.1 10*3/uL (ref 0.0–0.5)
Eosinophils Relative: 3 %
HCT: 41.1 % (ref 39.0–52.0)
Hemoglobin: 13.2 g/dL (ref 13.0–17.0)
Immature Granulocytes: 0 %
Lymphocytes Relative: 14 %
Lymphs Abs: 0.4 10*3/uL — ABNORMAL LOW (ref 0.7–4.0)
MCH: 29.3 pg (ref 26.0–34.0)
MCHC: 32.1 g/dL (ref 30.0–36.0)
MCV: 91.3 fL (ref 80.0–100.0)
Monocytes Absolute: 0.3 10*3/uL (ref 0.1–1.0)
Monocytes Relative: 10 %
Neutro Abs: 1.9 10*3/uL (ref 1.7–7.7)
Neutrophils Relative %: 72 %
Platelet Count: 109 10*3/uL — ABNORMAL LOW (ref 150–400)
RBC: 4.5 MIL/uL (ref 4.22–5.81)
RDW: 17.5 % — ABNORMAL HIGH (ref 11.5–15.5)
WBC Count: 2.6 10*3/uL — ABNORMAL LOW (ref 4.0–10.5)
nRBC: 0 % (ref 0.0–0.2)

## 2023-08-31 LAB — LACTATE DEHYDROGENASE: LDH: 135 U/L (ref 98–192)

## 2023-08-31 NOTE — Progress Notes (Signed)
[facility] [phone]   [phone]  OFFICE PROGRESS NOTE  Nelwyn Salisbury, MD [address] 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  Bone marrow biopsy and aspirate performed in November 2021 showed hypercellular marrow with trilineage hematopoiesis and minimal dyspoiesis.   PRIOR THERAPY: None   CURRENT THERAPY:  Vitamin B12 injection on a monthly basis  INTERVAL HISTORY: Guy Moore 89 y.o. male returns to the clinic today for 73-month follow-up visit accompanied by his wife.Discussed the use of AI scribe software for clinical note transcription with the patient, who gave verbal consent to proceed.  History of Present Illness   Guy Moore, an 89 year old patient with a history of myelodysplastic syndrome (MDS), kidney stones, and prostate issues, presents for a follow-up visit. The patient underwent a bone marrow biopsy in November 2021, which did not show any significant findings at the time, suggesting early MDS. Since then, the patient has been seen every six months.  In the past six months, the patient has experienced several health issues. He underwent treatment for kidney stones and bladder issues due to urinary retention. The bladder was found to be fine, and the issue was attributed to an enlarged prostate, for which the patient underwent surgery. No cancer was found in the prostate.  The patient also had a lesion on his penis skin, which was surgically removed and found to contain pre-cancerous cells. He is currently being treated for another lesion with ointment. Following these treatments, the patient experienced continuous bladder infections until August, which led to a rash believed to be caused by antibiotics. The rash subsequently developed into shingles.  The patient was on a catheter for urinary retention for six months, which has now been removed. He is currently  experiencing issues with his bladder function, leading to frequent waking during the night to urinate. This has resulted in feelings of tiredness and exhaustion.  The patient is currently seeing a physical therapist to help regain muscle function and is also under the care of a urologist for ongoing bladder issues. He has also been dealing with macular degeneration, for which he is under the care of an ophthalmologist. The patient has been on antibiotics from January through August, which he was told could affect his white blood cell count.       MEDICAL HISTORY: Past Medical History:  Diagnosis Date   Allergic rhinitis    Arthritis    Asthma    Atrial fibrillation [facility])    sees Dr. Bedelia Person at [facility]    Atrial flutter (HCC)    BCC (basal cell carcinoma of skin)    Nose   Chronic kidney disease    Complication of anesthesia    no problems per patient   COPD with emphysema [facility])    sees Dr. Coralyn Helling    Diverticulosis    Dysrhythmia    afib, flutter and tachycardia   Erosive esophagitis    Esophageal stricture    Gastric ulcer    Hearing loss    uses amplification   Hemorrhoids    Hiatal hernia    History of kidney stones    HTN (hypertension)    Hyperlipidemia    Knee problem    2% permanent partial impairment Right   Macular degeneration    Perineal abscess    Pneumonia    Pre-diabetes    Presence of permanent cardiac pacemaker  Sleep apnea    cpap    ALLERGIES:  has No Known Allergies.  MEDICATIONS:  Current Outpatient Medications  Medication Sig Dispense Refill   albuterol (VENTOLIN HFA) 108 (90 Base) MCG/ACT inhaler USE 2 INHALATIONS EVERY 6 HOURS AS NEEDED FOR WHEEZING OR SHORTNESS OF BREATH 54 g 3   amLODipine (NORVASC) 5 MG tablet TAKE 1 TABLET DAILY 90 tablet 3   BESIVANCE 0.6 % SUSP Place 1 drop into both eyes See admin instructions. Instill 1 drop into both eyes 3 times daily on the day of monthly eye injections then 4 times  daily the day after     Carboxymethylcellul-Glycerin (LUBRICATING EYE DROPS OP) Place 1 drop into both eyes daily as needed (dry eyes).     cyanocobalamin (VITAMIN B12) 1000 MCG/ML injection Inject 1 mL (1,000 mcg total) into the muscle every 30 (thirty) days. 1 mL 0   fluticasone (FLONASE) 50 MCG/ACT nasal spray Place 1 spray into both nostrils daily. (Patient taking differently: Place 1 spray into both nostrils daily as needed for allergies.) 16 g 2   fluticasone-salmeterol (ADVAIR) 250-50 MCG/ACT AEPB Inhale 1 puff into the lungs in the morning and at bedtime. 180 each 1   hydrocortisone cream 1 % Apply 1 Application topically daily as needed for itching.     imiquimod (ALDARA) 5 % cream Apply on Mon, Wed, and Friday and wash off after 6-10 hours     MAGNESIUM GLUCONATE PO Take 1 tablet by mouth daily.     Methylcellulose, Laxative, (CITRUCEL PO) Take 1-2 tablets by mouth daily as needed (constipation).     Multiple Vitamins-Minerals (PRESERVISION AREDS 2) CAPS Take 1 capsule by mouth 2 (two) times daily.     neomycin-bacitracin-polymyxin (NEOSPORIN) OINT Apply 1 Application topically as needed for wound care.     omeprazole (PRILOSEC) 40 MG capsule TAKE 1 CAPSULE DAILY (Patient taking differently: Take 40 mg by mouth daily as needed (heartburn).) 90 capsule 3   potassium chloride SA (KLOR-CON M) 20 MEQ tablet TAKE 1 TABLET DAILY 90 tablet 3   pravastatin (PRAVACHOL) 40 MG tablet TAKE 1 TABLET AT BEDTIME 90 tablet 3   valsartan (DIOVAN) 160 MG tablet TAKE 1 TABLET DAILY 90 tablet 3   XARELTO 20 MG TABS tablet TAKE 1 TABLET DAILY 90 tablet 3   No current facility-administered medications for this visit.    SURGICAL HISTORY:  Past Surgical History:  Procedure Laterality Date   CARDIAC ELECTROPHYSIOLOGY STUDY AND ABLATION     x 4 (sees Dr. Elmer Picker at [facility])   CO2 LASER APPLICATION N/A 03/26/2023   Procedure: CO2 LASER ABLATION OF PENIS;  Surgeon: Jannifer Hick, MD;  Location:  [facility];  Service: Urology;  Laterality: N/A;   CYST EXCISION PERINEAL     CYSTOSCOPY WITH URETEROSCOPY AND STENT PLACEMENT Right 10/29/2022   Procedure: CYSTOSCOPY WITH RETROGRADE WITH URETHERAL DILITATIONRIGHT DIAGNOSTIC  URETEROSCOPY AND STENT PLACEMENT;  Surgeon: Jannifer Hick, MD;  [facility];  Service: Urology;  Laterality: Right;   CYSTOSCOPY/URETEROSCOPY/HOLMIUM LASER/STENT PLACEMENT Right 11/27/2022   Procedure: CYSTOSCOPY/ RIGHT RETROGRADE PYELOGRAM/RIGHT HOLMIUM LASER/RIGHT STENT PLACEMENT/BASKET;  Surgeon: Jannifer Hick, MD;  [facility];  Service: Urology;  Laterality: Right;  60 MINUTES NEEDED FOR CASE   INSERT / REPLACE / REMOVE PACEMAKER     NOSE SURGERY     septoplasty   PENILE BIOPSY N/A 11/27/2022   Procedure: PENILE BIOPSY;  Surgeon: Jannifer Hick, MD;  [facility];  Service: Urology;  Laterality: N/A;   TRANSURETHRAL RESECTION OF PROSTATE N/A 03/26/2023   Procedure: BIPOLAR TRANSURETHRAL RESECTION OF THE PROSTATE (TURP);  Surgeon: Jannifer Hick, MD;  [facility];  Service: Urology;  Laterality: N/A;  90 MINUTES NEEDED FOR CASE   UVULOPALATOPHARYNGOPLASTY  06/13/1979   VASECTOMY  06/12/1969   w/complications    REVIEW OF SYSTEMS:  Constitutional: positive for fatigue Eyes: negative Ears, nose, mouth, throat, and face: negative Respiratory: negative Cardiovascular: negative Gastrointestinal: negative Genitourinary:negative Integument/breast: negative Hematologic/lymphatic: negative Musculoskeletal:negative Neurological: negative Behavioral/Psych: negative Endocrine: negative Allergic/Immunologic: negative   PHYSICAL EXAMINATION: General appearance: alert, cooperative, and no distress Head: Normocephalic, without obvious abnormality, atraumatic Neck: no adenopathy, no JVD, supple, symmetrical, trachea midline, and thyroid not enlarged, symmetric, no tenderness/mass/nodules Lymph nodes: Cervical, supraclavicular, and axillary nodes  normal. Resp: clear to auscultation bilaterally Back: symmetric, no curvature. ROM normal. No CVA tenderness. Cardio: regular rate and rhythm, S1, S2 normal, no murmur, click, rub or gallop GI: soft, non-tender; bowel sounds normal; no masses,  no organomegaly Extremities: extremities normal, atraumatic, no cyanosis or edema Neurologic: Alert and oriented X 3, normal strength and tone. Normal symmetric reflexes. Normal coordination and gait  ECOG PERFORMANCE STATUS: 1 - Symptomatic but completely ambulatory  Blood pressure (!) 145/92, pulse 77, temperature 98.6 F (37 C), temperature source Temporal, resp. rate 15, height 5\' 10"  (1.778 m), weight 227 lb 8 oz (103.2 kg), SpO2 98%.  LABORATORY DATA: Lab Results  Component Value Date   WBC 2.6 (L) 08/31/2023   HGB 13.2 08/31/2023   HCT 41.1 08/31/2023   MCV 91.3 08/31/2023   PLT 109 (L) 08/31/2023      Chemistry      Component Value Date/Time   NA 138 08/31/2023 1325   NA 141 06/27/2015 0000   K 4.5 08/31/2023 1325   CL 104 08/31/2023 1325   CO2 29 08/31/2023 1325   BUN 20 08/31/2023 1325   BUN 21 06/27/2015 0000   CREATININE 1.01 08/31/2023 1325   GLU 121 06/27/2015 0000      Component Value Date/Time   CALCIUM 8.9 08/31/2023 1325   ALKPHOS 51 08/31/2023 1325   AST 21 08/31/2023 1325   ALT 19 08/31/2023 1325   BILITOT 0.7 08/31/2023 1325       RADIOGRAPHIC STUDIES: CUP PACEART REMOTE DEVICE CHECK  Result Date: 08/16/2023 Scheduled remote reviewed. Normal device function.  1 VHR detection, EGM likely NSVT lasting 6 beats. Next remote 91 days. - CS, CVRS   ASSESSMENT AND PLAN: This is a very pleasant 89 years old white male with bicytopenia including leukocytopenia and thrombocytopenia likely secondary to iron deficiency but early MDS could not be completely excluded. The patient had a bone marrow biopsy and aspirate performed recently that was nonspecific but again could not rule out early myelodysplastic  syndrome. The patient is currently on observation except for the vitamin B12 injection and he is tolerating it well.    Myelodysplastic Syndrome (MDS) Followed for possible early MDS since November 2021. Initial bone marrow biopsy was non-significant. WBC dropped to 1.8 in June but recovered post-infection treatment. Current blood work is well-managed. - Follow-up in six months with repeat blood work - Report any changes immediately  Recurrent Urinary Tract Infections (UTIs) Continuous bladder infections post-prostate surgery, last infection in August. Developed rash from antibiotics, progressed to shingles. Ongoing dermatological treatment. - Monitor for recurrent infections - Continue dermatological treatment for rash and shingles  Benign Prostatic Hyperplasia (BPH) Post-surgery for BPH, experienced bladder infections and  urinary retention requiring catheterization for six months. Gradual improvement in bladder function. Advised to increase exercise. Undergoing physical therapy. - Continue follow-up with urologist Dr. Barry Dienes at [facility] - Continue physical therapy  Macular Degeneration Under care of Dr. Betsey Holiday. Treatment interval extended from four to five weeks. Right eye has scarring and minor bleeding. - Continue follow-up with Dr. Betsey Holiday as scheduled  General Health Maintenance Blood sugar is 82, creatinine 1.01, slightly elevated blood pressure. - Monitor blood pressure - Encourage regular exercise and physical therapy  Follow-up - Schedule follow-up appointment in six months - Repeat blood work at next visit.   He was advised to call immediately if he has any other concerning symptoms in the interval. The patient voices understanding of current disease status and treatment options and is in agreement with the current care plan.  All questions were answered. The patient knows to call the clinic with any problems, questions or concerns. We can certainly see the  patient much sooner if necessary.  Disclaimer: This note was dictated with voice recognition software. Similar sounding words can inadvertently be transcribed and may not be corrected upon review.

## 2023-09-02 ENCOUNTER — Ambulatory Visit: Payer: Medicare Other

## 2023-09-02 DIAGNOSIS — M5459 Other low back pain: Secondary | ICD-10-CM

## 2023-09-02 DIAGNOSIS — M48061 Spinal stenosis, lumbar region without neurogenic claudication: Secondary | ICD-10-CM

## 2023-09-02 DIAGNOSIS — M6281 Muscle weakness (generalized): Secondary | ICD-10-CM | POA: Diagnosis not present

## 2023-09-02 NOTE — Therapy (Signed)
OUTPATIENT PHYSICAL THERAPY TREATMENT NOTE   Patient Name: Guy Moore MRN: [id number] DOB:10-11-1934, 89 y.o., male Today's Date: 09/02/2023  END OF SESSION:  PT End of Session - 09/02/23 0915     Visit Number 2    Number of Visits 12    Date for PT Re-Evaluation 10/23/23    Authorization Type MCR    Progress Note Due on Visit 10    PT Start Time 0915    PT Stop Time 0953    PT Time Calculation (min) 38 min    Activity Tolerance Patient tolerated treatment well;Patient limited by pain    Behavior During Therapy [facility] for tasks assessed/performed              Past Medical History:  Diagnosis Date   Allergic rhinitis    Arthritis    Asthma    Atrial fibrillation (HCC)    sees Dr. Bedelia Person at [facility]    Atrial flutter (HCC)    BCC (basal cell carcinoma of skin)    Nose   Chronic kidney disease    Complication of anesthesia    no problems per patient   COPD with emphysema (HCC)    sees Dr. Coralyn Helling    Diverticulosis    Dysrhythmia    afib, flutter and tachycardia   Erosive esophagitis    Esophageal stricture    Gastric ulcer    Hearing loss    uses amplification   Hemorrhoids    Hiatal hernia    History of kidney stones    HTN (hypertension)    Hyperlipidemia    Knee problem    2% permanent partial impairment Right   Macular degeneration    Perineal abscess    Pneumonia    Pre-diabetes    Presence of permanent cardiac pacemaker    Sleep apnea    cpap   Past Surgical History:  Procedure Laterality Date   CARDIAC ELECTROPHYSIOLOGY STUDY AND ABLATION     x 4 (sees Dr. Elmer Picker at [facility])   CO2 LASER APPLICATION N/A 03/26/2023   Procedure: CO2 LASER ABLATION OF PENIS;  Surgeon: Jannifer Hick, MD;  Location: [facility];  Service: Urology;  Laterality: N/A;   CYST EXCISION PERINEAL     CYSTOSCOPY WITH URETEROSCOPY AND STENT PLACEMENT Right 10/29/2022   Procedure: CYSTOSCOPY WITH RETROGRADE WITH URETHERAL  DILITATIONRIGHT DIAGNOSTIC  URETEROSCOPY AND STENT PLACEMENT;  Surgeon: Jannifer Hick, MD;  Location: [facility];  Service: Urology;  Laterality: Right;   CYSTOSCOPY/URETEROSCOPY/HOLMIUM LASER/STENT PLACEMENT Right 11/27/2022   Procedure: CYSTOSCOPY/ RIGHT RETROGRADE PYELOGRAM/RIGHT HOLMIUM LASER/RIGHT STENT PLACEMENT/BASKET;  Surgeon: Jannifer Hick, MD;  [facility];  Service: Urology;  Laterality: Right;  60 MINUTES NEEDED FOR CASE   INSERT / REPLACE / REMOVE PACEMAKER     NOSE SURGERY     septoplasty   PENILE BIOPSY N/A 11/27/2022   Procedure: PENILE BIOPSY;  Surgeon: Jannifer Hick, MD;  [facility];  Service: Urology;  Laterality: N/A;   TRANSURETHRAL RESECTION OF PROSTATE N/A 03/26/2023   Procedure: BIPOLAR TRANSURETHRAL RESECTION OF THE PROSTATE (TURP);  Surgeon: Jannifer Hick, MD;  [facility];  Service: Urology;  Laterality: N/A;  90 MINUTES NEEDED FOR CASE   UVULOPALATOPHARYNGOPLASTY  06/13/1979   VASECTOMY  06/12/1969   w/complications   Patient Active Problem List   Diagnosis Date Noted   BPH (benign prostatic hyperplasia) 03/26/2023   Urinary retention 11/04/2022   Ureteral calculus, right 11/04/2022  History of UTI 11/04/2022   Hydronephrosis 11/04/2022   Perineal abscess 08/04/2021   CAP (community acquired pneumonia) 05/25/2021   Dyspnea 05/25/2021   Bicytopenia 08/06/2020   B12 deficiency 05/06/2020   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   Asthma 08/26/2016   Hyperglycemia 05/29/2016   Thoracic scoliosis 12/16/2015   Thrombocytopenia (HCC) 04/12/2015   History of basal cell cancer 06/27/2014   GERD (gastroesophageal reflux disease) 08/31/2011   Anticoagulant long-term use 08/31/2011   OBSTRUCTIVE SLEEP APNEA 07/04/2009   Atrial fibrillation (HCC) 04/19/2009   Allergic rhinitis 04/14/2007   Hyperlipidemia 04/13/2007   Essential hypertension 04/13/2007    PCP: Nelwyn Salisbury, MD  REFERRING PROVIDER: Nelwyn Salisbury, MD  REFERRING DIAG: [phone]  (ICD-10-CM) - Spinal stenosis of lumbar region without neurogenic claudication  Rationale for Evaluation and Treatment: Rehabilitation  THERAPY DIAG:  Other low back pain  Spinal stenosis of lumbar region, unspecified whether neurogenic claudication present  Muscle weakness (generalized)  ONSET DATE: 6-8 weeks  SUBJECTIVE:                                                                                                                                                                                           SUBJECTIVE STATEMENT: Patient reports that he has mostly stiffness in his lower back in the morning.  PERTINENT HISTORY:  Here for several weeks of right sided low back pain that radiates down the right buttock and thigh. No weakness or numbness. He had an MRI in 2017 that revealed a moderate to severe stenosis at L4-5. He takes Tylenol as needed.   PAIN:  Are you having pain? Yes: NPRS scale: 2-3/10 Pain location: R low back Pain description: ache Aggravating factors: lifting, prolonged positioning Relieving factors: position changes, lying supine  PRECAUTIONS: None  RED FLAGS: None   WEIGHT BEARING RESTRICTIONS: No  FALLS:  Has patient fallen in last 6 months? No  OCCUPATION: retired  PLOF: Independent  PATIENT GOALS: To manage my back pain  NEXT MD VISIT: PRN  OBJECTIVE:  Note: Objective measures were completed at Evaluation unless otherwise noted.  DIAGNOSTIC FINDINGS:  IMPRESSION: 1. Nondisplaced posterior left eighth rib fracture. 2. Otherwise no acute osseous abnormality in the thoracolumbar spine or visible sacrum. 3. Overall mild for age thoracic and lumbar spine degeneration. No thoracic spinal stenosis. There is moderate to severe degenerative lumbar spinal stenosis at L4-L5, primarily due to severe posterior element degeneration.     Electronically Signed   By: Odessa Fleming M.D.   On: 02/02/2016 10:39  PATIENT SURVEYS:  FOTO 30(52  predicted)  MUSCLE LENGTH: Hamstrings: Right 70 deg; Left 70 deg  POSTURE: rounded shoulders, decreased lumbar lordosis, and increased thoracic kyphosis  PALPATION: TTP R lumbar paraspinals   LUMBAR ROM: Deferred due to dizziness with movement  AROM eval  Flexion   Extension   Right lateral flexion   Left lateral flexion   Right rotation   Left rotation    (Blank rows = not tested)  LOWER EXTREMITY ROM:   WFL for gait and transfers  Active  Right eval Left eval  Hip flexion    Hip extension    Hip abduction    Hip adduction    Hip internal rotation    Hip external rotation    Knee flexion    Knee extension    Ankle dorsiflexion    Ankle plantarflexion    Ankle inversion    Ankle eversion     (Blank rows = not tested)  LOWER EXTREMITY MMT:    MMT Right eval Left eval  Hip flexion 4- 4-  Hip extension 4- 4-  Hip abduction 4- 4-  Hip adduction    Hip internal rotation    Hip external rotation    Knee flexion 4- 4-  Knee extension 4- 4-  Ankle dorsiflexion    Ankle plantarflexion 4- 4-  Ankle inversion    Ankle eversion     (Blank rows = not tested)  LUMBAR SPECIAL TESTS:  Straight leg raise test: Negative and Slump test: Negative  FUNCTIONAL TESTS:  30 seconds chair stand test 8 stands 09/02/23: 304 ft GAIT: Distance walked: 72ft x2 Assistive device utilized: None Level of assistance: Complete Independence Comments: slow cadence  TODAY'S TREATMENT:   [facility] Adult PT Treatment:                                                DATE: 09/02/23 Therapeutic Exercise: Seated LAQ 2.5# 2x10 BIL Seated marching 2.5# 2x30" Seated hamstring curl RTB 2x10 BIL Seated hip adduction ball squeeze 5" 2x10  Standing heel/toe raise 2x10 Standing hip abduction 2x10 BIL Step ups 4" fwd/lat x10 each BIL Therapeutic Activity: 304 ft                                                                                                                                DATE: 08/23/23 Eval    PATIENT EDUCATION:  Education details: Discussed eval findings, rehab rationale and POC and patient is in agreement  Person educated: Patient Education method: Explanation Education comprehension: verbalized understanding and needs further education  HOME EXERCISE PROGRAM: Access Code: [email or web address] URL: [email or web address] Date: 08/23/2023 Prepared by: Gustavus Bryant  Exercises - Sit to Stand with Armchair  - 2 x daily - 5 x weekly - 1 sets - 5 reps  ASSESSMENT:  CLINICAL IMPRESSION: Patient presents to first follow up PT session reporting mostly stiffness in his lower back  and that he is able to walk for 20 minutes at a time before needing a rest break. performed this session with patient achieving 304 feet. Session today focused on LE strengthening. Patient was able to tolerate all prescribed exercises with no adverse effects. Patient continues to benefit from skilled PT services and should be progressed as able to improve functional independence.    OBJECTIVE IMPAIRMENTS: Abnormal gait, decreased activity tolerance, decreased endurance, decreased knowledge of condition, difficulty walking, decreased strength, improper body mechanics, postural dysfunction, obesity, and pain.   ACTIVITY LIMITATIONS: carrying, lifting, bending, standing, squatting, and stairs  PERSONAL FACTORS: Age, Fitness, Time since onset of injury/illness/exacerbation, and 1 comorbidity: underlying stenosis  are also affecting patient's functional outcome.   REHAB POTENTIAL: Good  CLINICAL DECISION MAKING: Stable/uncomplicated  EVALUATION COMPLEXITY: Low   GOALS: Goals reviewed with patient? No  SHORT TERM GOALS: Target date: 09/13/2023    Patient to demonstrate independence in HEP  Baseline: [id number] Goal status: INITIAL  2.  Patient to stand from chair w/o need of UE Support Baseline: Needs BUE support Goal status: INITIAL  3.  Assess 2 MWT and  establish goal Baseline: TBD Goal status: INITIAL    LONG TERM GOALS: Target date: 10/04/2023    Patient will score at least 52% on FOTO to signify clinically meaningful improvement in functional abilities.   Baseline: 30% Goal status: INITIAL  2.  Patient will increase 30s chair stand reps from 8 to 10 with/without arms to demonstrate and improved functional ability with less pain/difficulty as well as reduce fall risk.  Baseline: 8 Goal status: INITIAL  3.  Patient will acknowledge 4/10 pain at least once during episode of care  Baseline: 8/10 Goal status: INITIAL  4.  4/5 BLE strength throughout Baseline:  MMT Right eval Left eval  Hip flexion 4- 4-  Hip extension 4- 4-  Hip abduction 4- 4-  Hip adduction    Hip internal rotation    Hip external rotation    Knee flexion 4- 4-  Knee extension 4- 4-  Ankle dorsiflexion    Ankle plantarflexion 4- 4-   Goal status: INITIAL  5.  Assess progress on 2 MWT Baseline: TBD Goal status: INITIAL  6.  Decrease tenderness to R lumbar paraspinals to minimal Baseline: Moderate tenderness Goal status: INITIAL  PLAN:  PT FREQUENCY: 1-2x/week  PT DURATION: 6 weeks  PLANNED INTERVENTIONS: 97164- PT Re-evaluation, 97110-Therapeutic exercises, 97530- Therapeutic activity, 97112- Neuromuscular re-education, 97535- Self Care, 21308- Manual therapy, [phone]- Gait training, [phone]- Electrical stimulation (unattended), Patient/Family education, Balance training, Stair training, Dry Needling, Joint mobilization, Spinal mobilization, Cryotherapy, and Moist heat.  PLAN FOR NEXT SESSION: HEP review and update, manual techniques as appropriate, aerobic tasks, ROM and flexibility activities, strengthening and PREs, TPDN, gait and balance training as needed     Berta Minor, PTA 09/02/2023, 9:53 AM

## 2023-09-03 ENCOUNTER — Ambulatory Visit (INDEPENDENT_AMBULATORY_CARE_PROVIDER_SITE_OTHER): Payer: Medicare Other

## 2023-09-03 DIAGNOSIS — E538 Deficiency of other specified B group vitamins: Secondary | ICD-10-CM | POA: Diagnosis not present

## 2023-09-03 MED ORDER — CYANOCOBALAMIN 1000 MCG/ML IJ SOLN
1000.0000 ug | Freq: Once | INTRAMUSCULAR | Status: AC
  Administered 2023-09-03: 1000 ug via INTRAMUSCULAR

## 2023-09-03 NOTE — Progress Notes (Signed)
Pt here for monthly B12 injection per Dr Clent Ridges  B12 given IM and pt tolerated injection well.  Next B12 injection pt will call to schedule

## 2023-09-05 ENCOUNTER — Other Ambulatory Visit: Payer: Self-pay | Admitting: Family Medicine

## 2023-09-06 ENCOUNTER — Ambulatory Visit: Payer: Medicare Other | Admitting: Physical Therapy

## 2023-09-06 ENCOUNTER — Encounter: Payer: Self-pay | Admitting: Physical Therapy

## 2023-09-06 DIAGNOSIS — M5459 Other low back pain: Secondary | ICD-10-CM

## 2023-09-06 DIAGNOSIS — M6281 Muscle weakness (generalized): Secondary | ICD-10-CM

## 2023-09-06 DIAGNOSIS — M48061 Spinal stenosis, lumbar region without neurogenic claudication: Secondary | ICD-10-CM | POA: Diagnosis not present

## 2023-09-06 NOTE — Therapy (Signed)
OUTPATIENT PHYSICAL THERAPY TREATMENT NOTE   Patient Name: Guy Moore MRN: [id number] DOB:1934-03-07, 89 y.o., male Today's Date: 09/06/2023  END OF SESSION:  PT End of Session - 09/06/23 1146     Visit Number 3    Number of Visits 12    Date for PT Re-Evaluation 10/23/23    Authorization Type MCR    Progress Note Due on Visit 10    PT Start Time 1146    PT Stop Time 1226    PT Time Calculation (min) 40 min    Activity Tolerance Patient tolerated treatment well    Behavior During Therapy WFL for tasks assessed/performed               Past Medical History:  Diagnosis Date   Allergic rhinitis    Arthritis    Asthma    Atrial fibrillation (HCC)    se[facility] Dr. Bedelia Person at [facility]    Atrial flutter (HCC)    BCC (basal cell carcinoma of skin)    Nose   Chronic kidney disease    Complication of anesthesia    no problems per patient   COPD with emphysema (HCC)    sees Dr. Coralyn Helling    Diverticulosis    Dysrhythmia    afib, flutter and tachycardia   Erosive esophagitis    Esophageal stricture    Gastric ulcer    Hearing loss    uses amplification   Hemorrhoids    Hiatal hernia    History of kidney stones    HTN (hypertension)    Hyperlipidemia    Knee problem    2% permanent partial impairment Right   Macular degeneration    Perineal abscess    Pneumonia    Pre-diabetes    Presence of permanent cardiac pacemaker    Sleep apnea    cpap   Past Surgical History:  Procedure Laterality Date   CARDIAC ELECTROPHYSIOLOGY STUDY AND ABLATION     x 4 (sees Dr. Elmer Picker at [facility])   CO2 LASER APPLICATION N/A 03/26/2023   Procedure: CO2 LASER ABLATION OF PENIS;  Surgeon: Jannifer Hick, MD;  [facility]: [facility];  Service: Urology;  Laterality: N/A;   CYST EXCISION PERINEAL     CYSTOSCOPY WITH URETEROSCOPY AND STENT PLACEMENT Right 10/29/2022   Procedure: CYSTOSCOPY WITH RETROGRADE WITH URETHERAL DILITATIONRIGHT DIAGNOSTIC   URETEROSCOPY AND STENT PLACEMENT;  Surgeon: Jannifer Hick, MD;  [facility];  Service: Urology;  Laterality: Right;   CYSTOSCOPY/URETEROSCOPY/HOLMIUM LASER/STENT PLACEMENT Right 11/27/2022   Procedure: CYSTOSCOPY/ RIGHT RETROGRADE PYELOGRAM/RIGHT HOLMIUM LASER/RIGHT STENT PLACEMENT/BASKET;  Surgeon: Jannifer Hick, MD;  Location: [facility];  Service: Urology;  Laterality: Right;  60 MINUTES NEEDED FOR CASE   INSERT / REPLACE / REMOVE PACEMAKER     NOSE SURGERY     septoplasty   PENILE BIOPSY N/A 11/27/2022   Procedure: PENILE BIOPSY;  Surgeon: Jannifer Hick, MD;  [facility];  Service: Urology;  Laterality: N/A;   TRANSURETHRAL RESECTION OF PROSTATE N/A 03/26/2023   Procedure: BIPOLAR TRANSURETHRAL RESECTION OF THE PROSTATE (TURP);  Surgeon: Jannifer Hick, MD;  [facility];  Service: Urology;  Laterality: N/A;  90 MINUTES NEEDED FOR CASE   UVULOPALATOPHARYNGOPLASTY  06/13/1979   VASECTOMY  06/12/1969   w/complications   Patient Active Problem List   Diagnosis Date Noted   BPH (benign prostatic hyperplasia) 03/26/2023   Urinary retention 11/04/2022   Ureteral calculus, right 11/04/2022  History of UTI 11/04/2022   Hydronephrosis 11/04/2022   Perineal abscess 08/04/2021   CAP (community acquired pneumonia) 05/25/2021   Dyspnea 05/25/2021   Bicytopenia 08/06/2020   B12 deficiency 05/06/2020   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   Asthma 08/26/2016   Hyperglycemia 05/29/2016   Thoracic scoliosis 12/16/2015   Thrombocytopenia (HCC) 04/12/2015   History of basal cell cancer 06/27/2014   GERD (gastroesophageal reflux disease) 08/31/2011   Anticoagulant long-term use 08/31/2011   OBSTRUCTIVE SLEEP APNEA 07/04/2009   Atrial fibrillation (HCC) 04/19/2009   Allergic rhinitis 04/14/2007   Hyperlipidemia 04/13/2007   Essential hypertension 04/13/2007    PCP: Nelwyn Salisbury, MD  REFERRING PROVIDER: Nelwyn Salisbury, MD  REFERRING DIAG: [phone] (ICD-10-CM) - Spinal stenosis of  lumbar region without neurogenic claudication  Rationale for Evaluation and Treatment: Rehabilitation  THERAPY DIAG:  Other low back pain  Muscle weakness (generalized)  ONSET DATE: 6-8 weeks  SUBJECTIVE:                                                                                                                                                                                           SUBJECTIVE STATEMENT: Pt states he slept well last night which helped is pain some. Felt good after last session, no pain or soreness. Still having stiffness and occasional pain. Has been doing some of his HEP   PERTINENT HISTORY:  Here for several weeks of right sided low back pain that radiates down the right buttock and thigh. No weakness or numbness. He had an MRI in 2017 that revealed a moderate to severe stenosis at L4-5. He takes Tylenol as needed.   PAIN:   NPS: "almost zero" / 10 Pain location: R low back Pain description: ache Aggravating factors: lifting, prolonged positioning Relieving factors: position changes, lying supine  PRECAUTIONS: Pacemaker present  RED FLAGS: None   WEIGHT BEARING RESTRICTIONS: No  FALLS:  Has patient fallen in last 6 months? No  OCCUPATION: retired  PLOF: Independent  PATIENT GOALS: To manage my back pain  NEXT MD VISIT: PRN  OBJECTIVE:  Note: Objective measures were completed at Evaluation unless otherwise noted.  DIAGNOSTIC FINDINGS:  IMPRESSION: 1. Nondisplaced posterior left eighth rib fracture. 2. Otherwise no acute osseous abnormality in the thoracolumbar spine or visible sacrum. 3. Overall mild for age thoracic and lumbar spine degeneration. No thoracic spinal stenosis. There is moderate to severe degenerative lumbar spinal stenosis at L4-L5, primarily due to severe posterior element degeneration.     Electronically Signed   By: Odessa Fleming M.D.   On: 02/02/2016 10:39  PATIENT SURVEYS:  FOTO 30(52 predicted)  MUSCLE  LENGTH: Hamstrings: Right 70 deg; Left 70 deg  POSTURE: rounded shoulders, decreased lumbar lordosis, and increased thoracic kyphosis  PALPATION: TTP R lumbar paraspinals   LUMBAR ROM: Deferred due to dizziness with movement  AROM eval  Flexion   Extension   Right lateral flexion   Left lateral flexion   Right rotation   Left rotation    (Blank rows = not tested)  LOWER EXTREMITY ROM:   WFL for gait and transfers  Active  Right eval Left eval  Hip flexion    Hip extension    Hip abduction    Hip adduction    Hip internal rotation    Hip external rotation    Knee flexion    Knee extension    Ankle dorsiflexion    Ankle plantarflexion    Ankle inversion    Ankle eversion     (Blank rows = not tested)  LOWER EXTREMITY MMT:    MMT Right eval Left eval  Hip flexion 4- 4-  Hip extension 4- 4-  Hip abduction 4- 4-  Hip adduction    Hip internal rotation    Hip external rotation    Knee flexion 4- 4-  Knee extension 4- 4-  Ankle dorsiflexion    Ankle plantarflexion 4- 4-  Ankle inversion    Ankle eversion     (Blank rows = not tested)  LUMBAR SPECIAL TESTS:  Straight leg raise test: Negative and Slump test: Negative  FUNCTIONAL TESTS:  30 seconds chair stand test 8 stands 09/02/23: 304 ft GAIT: Distance walked: 62ft x2 Assistive device utilized: None Level of assistance: Complete Independence Comments: slow cadence  TODAY'S TREATMENT:   [facility] Adult PT Treatment:                                                DATE: 09/06/23 Therapeutic Exercise: 4# LAQ 2x10 BIL 4# seated march 2x10 BIL Standing heel toe raises at counter 2x15 Standing march 2x10 BIL LE at counter STS standard chair x8 w/ BUE support, x8 unilat UE support HEP update + handout/education, rationale for interventions   [facility] Adult PT Treatment:                                                DATE: 09/02/23 Therapeutic Exercise: Seated LAQ 2.5# 2x10 BIL Seated marching 2.5#  2x30" Seated hamstring curl RTB 2x10 BIL Seated hip adduction ball squeeze 5" 2x10  Standing heel/toe raise 2x10 Standing hip abduction 2x10 BIL Step ups 4" fwd/lat x10 each BIL Therapeutic Activity: 304 ft  DATE: 08/23/23 Eval    PATIENT EDUCATION:  Education details: rationale for interventions, HEP  Person educated: Patient Education method: Explanation Education comprehension: verbalized understanding and needs further education  HOME EXERCISE PROGRAM: Access Code: [email or web address] URL: [email or web address] Date: 09/06/2023 Prepared by: Fransisco Hertz  Exercises - Sit to Stand with Armchair  - 2 x daily - 5 x weekly - 1 sets - 5 reps - Heel Toe Raises with Counter Support  - 2 x daily - 5 x weekly - 1 sets - 10 reps - Standing March with Counter Support  - 2 x daily - 5 x weekly - 1 sets - 8 reps  ASSESSMENT:  CLINICAL IMPRESSION: Pt arrives w/ minimal pain reporting good response to last session. Today focusing on increasing STS volume and increasing time with standing activities which pt does well with, does require rest breaks given some muscular fatigue. No adverse events, tolerates session quite well overall and denies any increase in pain. Recommend continuing along current POC in order to address relevant deficits and improve functional tolerance. Pt departs today's session in no acute distress, all voiced questions/concerns addressed appropriately from PT perspective.     OBJECTIVE IMPAIRMENTS: Abnormal gait, decreased activity tolerance, decreased endurance, decreased knowledge of condition, difficulty walking, decreased strength, improper body mechanics, postural dysfunction, obesity, and pain.   ACTIVITY LIMITATIONS: carrying, lifting, bending, standing, squatting, and stairs  PERSONAL FACTORS: Age, Fitness, Time since onset of  injury/illness/exacerbation, and 1 comorbidity: underlying stenosis  are also affecting patient's functional outcome.   REHAB POTENTIAL: Good  CLINICAL DECISION MAKING: Stable/uncomplicated  EVALUATION COMPLEXITY: Low   GOALS: Goals reviewed with patient? No  SHORT TERM GOALS: Target date: 09/13/2023    Patient to demonstrate independence in HEP  Baseline: [id number] Goal status: INITIAL  2.  Patient to stand from chair w/o need of UE Support Baseline: Needs BUE support Goal status: INITIAL  3.  Assess 2 MWT and establish goal Baseline: TBD Goal status: INITIAL    LONG TERM GOALS: Target date: 10/04/2023    Patient will score at least 52% on FOTO to signify clinically meaningful improvement in functional abilities.   Baseline: 30% Goal status: INITIAL  2.  Patient will increase 30s chair stand reps from 8 to 10 with/without arms to demonstrate and improved functional ability with less pain/difficulty as well as reduce fall risk.  Baseline: 8 Goal status: INITIAL  3.  Patient will acknowledge 4/10 pain at least once during episode of care  Baseline: 8/10 Goal status: INITIAL  4.  4/5 BLE strength throughout Baseline:  MMT Right eval Left eval  Hip flexion 4- 4-  Hip extension 4- 4-  Hip abduction 4- 4-  Hip adduction    Hip internal rotation    Hip external rotation    Knee flexion 4- 4-  Knee extension 4- 4-  Ankle dorsiflexion    Ankle plantarflexion 4- 4-   Goal status: INITIAL  5.  Assess progress on 2 MWT Baseline: TBD Goal status: INITIAL  6.  Decrease tenderness to R lumbar paraspinals to minimal Baseline: Moderate tenderness Goal status: INITIAL  PLAN:  PT FREQUENCY: 1-2x/week  PT DURATION: 6 weeks  PLANNED INTERVENTIONS: 97164- PT Re-evaluation, 97110-Therapeutic exercises, 97530- Therapeutic activity, 97112- Neuromuscular re-education, 97535- Self Care, 54098- Manual therapy, [phone]- Gait training, [phone]- Electrical stimulation  (unattended), Patient/Family education, Balance training, Stair training, Dry Needling, Joint mobilization, Spinal mobilization, Cryotherapy, and Moist heat.  PLAN FOR NEXT SESSION: HEP review and update, manual techniques as appropriate,  aerobic tasks, ROM and flexibility activities, strengthening and PREs, TPDN, gait and balance training as needed. Mindful of pacemaker   Ashley Murrain PT, DPT 09/06/2023 12:31 PM

## 2023-09-08 ENCOUNTER — Ambulatory Visit: Payer: Medicare Other

## 2023-09-08 DIAGNOSIS — M6281 Muscle weakness (generalized): Secondary | ICD-10-CM | POA: Diagnosis not present

## 2023-09-08 DIAGNOSIS — M48061 Spinal stenosis, lumbar region without neurogenic claudication: Secondary | ICD-10-CM | POA: Diagnosis not present

## 2023-09-08 DIAGNOSIS — M5459 Other low back pain: Secondary | ICD-10-CM

## 2023-09-08 NOTE — Therapy (Signed)
OUTPATIENT PHYSICAL THERAPY TREATMENT NOTE   Patient Name: Guy Moore MRN: [id number] DOB:1934/03/25, 89 y.o., male Today's Date: 09/08/2023  END OF SESSION:  PT End of Session - 09/08/23 0954     Visit Number 4    Number of Visits 12    Date for PT Re-Evaluation 10/23/23    Progress Note Due on Visit 10    PT Start Time 0955    PT Stop Time 1033    PT Time Calculation (min) 38 min    Activity Tolerance Patient tolerated treatment well    Behavior During Therapy WFL for tasks assessed/performed              Past Medical History:  Diagnosis Date   Allergic rhinitis    Arthritis    Asthma    Atrial fibrillation (HCC)    sees Dr. Bedelia Person at [facility]    Atrial flutter (HCC)    BCC (basal cell carcinoma of skin)    Nose   Chronic kidney disease    Complication of anesthesia    no problems per patient   COPD with emphysema (HCC)    sees Dr. Coralyn Helling    Diverticulosis    Dysrhythmia    afib, flutter and tachycardia   Erosive esophagitis    Esophageal stricture    Gastric ulcer    Hearing loss    uses amplification   Hemorrhoids    Hiatal hernia    History of kidney stones    HTN (hypertension)    Hyperlipidemia    Knee problem    2% permanent partial impairment Right   Macular degeneration    Perineal abscess    Pneumonia    Pre-diabetes    Presence of permanent cardiac pacemaker    Sleep apnea    cpap   Past Surgical History:  Procedure Laterality Date   CARDIAC ELECTROPHYSIOLOGY STUDY AND ABLATION     x 4 (sees Dr. Elmer Picker at [facility])   CO2 LASER APPLICATION N/A 03/26/2023   Procedure: CO2 LASER ABLATION OF PENIS;  Surgeon: Jannifer Hick, MD;  Location: [facility];  Service: Urology;  Laterality: N/A;   CYST EXCISION PERINEAL     CYSTOSCOPY WITH URETEROSCOPY AND STENT PLACEMENT Right 10/29/2022   Procedure: CYSTOSCOPY WITH RETROGRADE WITH URETHERAL DILITATIONRIGHT DIAGNOSTIC  URETEROSCOPY AND STENT  PLACEMENT;  Surgeon: Jannifer Hick, MD;  Location: [facility];  Service: Urology;  Laterality: Right;   CYSTOSCOPY/URETEROSCOPY/HOLMIUM LASER/STENT PLACEMENT Right 11/27/2022   Procedure: CYSTOSCOPY/ RIGHT RETROGRADE PYELOGRAM/RIGHT HOLMIUM LASER/RIGHT STENT PLACEMENT/BASKET;  Surgeon: Jannifer Hick, MD;  Location: [facility];  Service: Urology;  Laterality: Right;  60 MINUTES NEEDED FOR CASE   INSERT / REPLACE / REMOVE PACEMAKER     NOSE SURGERY     septoplasty   PENILE BIOPSY N/A 11/27/2022   Procedure: PENILE BIOPSY;  Surgeon: Jannifer Hick, MD;  Location: [facility];  Service: Urology;  Laterality: N/A;   TRANSURETHRAL RESECTION OF PROSTATE N/A 03/26/2023   Procedure: BIPOLAR TRANSURETHRAL RESECTION OF THE PROSTATE (TURP);  Surgeon: Jannifer Hick, MD;  Location: [facility];  Service: Urology;  Laterality: N/A;  90 MINUTES NEEDED FOR CASE   UVULOPALATOPHARYNGOPLASTY  06/13/1979   VASECTOMY  06/12/1969   w/complications   Patient Active Problem List   Diagnosis Date Noted   BPH (benign prostatic hyperplasia) 03/26/2023   Urinary retention 11/04/2022   Ureteral calculus, right 11/04/2022   History of UTI 11/04/2022   Hydronephrosis  11/04/2022   Perineal abscess 08/04/2021   CAP (community acquired pneumonia) 05/25/2021   Dyspnea 05/25/2021   Bicytopenia 08/06/2020   B12 deficiency 05/06/2020   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   Asthma 08/26/2016   Hyperglycemia 05/29/2016   Thoracic scoliosis 12/16/2015   Thrombocytopenia (HCC) 04/12/2015   History of basal cell cancer 06/27/2014   GERD (gastroesophageal reflux disease) 08/31/2011   Anticoagulant long-term use 08/31/2011   OBSTRUCTIVE SLEEP APNEA 07/04/2009   Atrial fibrillation (HCC) 04/19/2009   Allergic rhinitis 04/14/2007   Hyperlipidemia 04/13/2007   Essential hypertension 04/13/2007    PCP: Nelwyn Salisbury, MD  REFERRING PROVIDER: Nelwyn Salisbury, MD  REFERRING DIAG: [phone] (ICD-10-CM) - Spinal stenosis of lumbar 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    PERTINENT HISTORY:  Here for several weeks of right sided low back pain that radiates down the right buttock and thigh. No weakness or numbness. He had an MRI in 2017 that revealed a moderate to severe stenosis at L4-5. He takes Tylenol as needed.   PAIN:   NPS: "almost zero" / 10 Pain location: R low back Pain description: ache Aggravating factors: lifting, prolonged positioning Relieving factors: position changes, lying supine  PRECAUTIONS: Pacemaker present  RED FLAGS: None   WEIGHT BEARING RESTRICTIONS: No  FALLS:  Has patient fallen in last 6 months? No  OCCUPATION: retired  PLOF: Independent  PATIENT GOALS: To manage my back pain  NEXT MD VISIT: PRN  OBJECTIVE:  Note: Objective measures were completed at Evaluation unless otherwise noted.  DIAGNOSTIC FINDINGS:  IMPRESSION: 1. Nondisplaced posterior left eighth rib fracture. 2. Otherwise no acute osseous abnormality in the thoracolumbar spine or visible sacrum. 3. Overall mild for age thoracic and lumbar spine degeneration. No thoracic spinal stenosis. There is moderate to severe degenerative lumbar spinal stenosis at L4-L5, primarily due to severe posterior element degeneration.     Electronically Signed   By: [place] Fleming M.D.   On: 02/02/2016 10:39  PATIENT SURVEYS:  FOTO 30(52 predicted)  MUSCLE LENGTH: Hamstrings: Right 70 deg;  Left 70 deg  POSTURE: rounded shoulders, decreased  lumbar lordosis, and increased thoracic kyphosis  PALPATION: TTP R lumbar paraspinals   LUMBAR ROM: Deferred due to dizziness with movement  AROM eval  Flexion   Extension   Right lateral flexion   Left lateral flexion   Right rotation   Left rotation    (Blank rows = not tested)  LOWER EXTREMITY ROM:   WFL for gait and transfers  Active  Right eval Left eval  Hip flexion    Hip extension    Hip abduction    Hip adduction    Hip internal rotation    Hip external rotation    Knee flexion    Knee extension    Ankle dorsiflexion    Ankle plantarflexion    Ankle inversion    Ankle eversion     (Blank rows = not tested)  LOWER EXTREMITY MMT:    MMT Right eval Left eval  Hip flexion 4- 4-  Hip extension 4- 4-  Hip abduction 4- 4-  Hip adduction    Hip internal rotation    Hip external rotation    Knee flexion 4- 4-  Knee extension 4- 4-  Ankle dorsiflexion    Ankle plantarflexion 4- 4-  Ankle inversion    Ankle eversion     (Blank rows = not tested)  LUMBAR SPECIAL TESTS:  Straight leg raise test: Negative and Slump test: Negative  FUNCTIONAL TESTS:  30 seconds chair stand test 8 stands 09/02/23: 304 ft GAIT: Distance walked: 62ft x2 Assistive device utilized: None Level of assistance: Complete Independence Comments: slow cadence  TODAY'S TREATMENT:   [facility] Adult PT Treatment:                                                DATE: 09/08/23 Therapeutic Exercise: Nustep level 5 x 5 mins Standing hip abduction 2x10 BIL Step ups 4" fwd/lat x10 each BIL Standing heel toe raises at counter 2x15 Standing march 2x10 BIL LE at counter 4# LAQ 2x10 BIL 4# seated march 2x10 BIL Seated hip adduction ball squeeze 3" hold 2x10 STS low mat table - no UE support x8, x5    [facility] Adult PT Treatment:                                                DATE: 09/06/23 Therapeutic Exercise: 4# LAQ 2x10 BIL 4# seated  march 2x10 BIL Standing heel toe raises at counter 2x15 Standing march 2x10 BIL LE at counter STS standard chair x8 w/ BUE support, x8 unilat UE support HEP update + handout/education, rationale for interventions   [facility] Adult PT Treatment:                                                DATE: 09/02/23 Therapeutic Exercise: Seated LAQ 2.5# 2x10 BIL Seated marching 2.5# 2x30" Seated hamstring curl RTB 2x10 BIL Seated hip adduction ball squeeze 5" 2x10  Standing heel/toe raise 2x10 Standing hip abduction 2x10 BIL Step ups 4" fwd/lat x10 each BIL Therapeutic Activity: 304 ft    PATIENT EDUCATION:  Education details: rationale for interventions,  HEP  Person educated: Patient Education method: Explanation Education comprehension: verbalized understanding and needs further education  HOME EXERCISE PROGRAM: Access Code: [email or web address] URL: [email or web address] Date: 09/06/2023 Prepared by: Fransisco Hertz  Exercises - Sit to Stand with Armchair  - 2 x daily - 5 x weekly - 1 sets - 5 reps - Heel Toe Raises with Counter Support  - 2 x daily - 5 x weekly - 1 sets - 10 reps - Standing March with Counter Support  - 2 x daily - 5 x weekly - 1 sets - 8 reps  ASSESSMENT:  CLINICAL IMPRESSION: Patient presents to PT reporting minimal pain in his lower back today. Session today continued to focus on improving standing activity tolerance and general strengthening. Patient was able to tolerate all prescribed exercises with no adverse effects. Patient continues to benefit from skilled PT services and should be progressed as able to improve functional independence.    OBJECTIVE IMPAIRMENTS: Abnormal gait, decreased activity tolerance, decreased endurance, decreased knowledge of condition, difficulty walking, decreased strength, improper body mechanics, postural dysfunction, obesity, and pain.   ACTIVITY LIMITATIONS: carrying, lifting, bending, standing, squatting, and  stairs  PERSONAL FACTORS: Age, Fitness, Time since onset of injury/illness/exacerbation, and 1 comorbidity: underlying stenosis  are also affecting patient's functional outcome.   REHAB POTENTIAL: Good  CLINICAL DECISION MAKING: Stable/uncomplicated  EVALUATION COMPLEXITY: Low   GOALS: Goals reviewed with patient? No  SHORT TERM GOALS: Target date: 09/13/2023    Patient to demonstrate independence in HEP  Baseline: [id number] Goal status: INITIAL  2.  Patient to stand from chair w/o need of UE Support Baseline: Needs BUE support Goal status: INITIAL  3.  Assess 2 MWT and establish goal Baseline: TBD Goal status: INITIAL    LONG TERM GOALS: Target date: 10/04/2023    Patient will score at least 52% on FOTO to signify clinically meaningful improvement in functional abilities.   Baseline: 30% Goal status: INITIAL  2.  Patient will increase 30s chair stand reps from 8 to 10 with/without arms to demonstrate and improved functional ability with less pain/difficulty as well as reduce fall risk.  Baseline: 8 Goal status: INITIAL  3.  Patient will acknowledge 4/10 pain at least once during episode of care  Baseline: 8/10 Goal status: INITIAL  4.  4/5 BLE strength throughout Baseline:  MMT Right eval Left eval  Hip flexion 4- 4-  Hip extension 4- 4-  Hip abduction 4- 4-  Hip adduction    Hip internal rotation    Hip external rotation    Knee flexion 4- 4-  Knee extension 4- 4-  Ankle dorsiflexion    Ankle plantarflexion 4- 4-   Goal status: INITIAL  5.  Assess progress on 2 MWT Baseline: TBD Goal status: INITIAL  6.  Decrease tenderness to R lumbar paraspinals to minimal Baseline: Moderate tenderness Goal status: INITIAL  PLAN:  PT FREQUENCY: 1-2x/week  PT DURATION: 6 weeks  PLANNED INTERVENTIONS: 97164- PT Re-evaluation, 97110-Therapeutic exercises, 97530- Therapeutic activity, 97112- Neuromuscular re-education, 97535- Self Care, 30865- Manual  therapy, [phone]- Gait training, [phone]- Electrical stimulation (unattended), Patient/Family education, Balance training, Stair training, Dry Needling, Joint mobilization, Spinal mobilization, Cryotherapy, and Moist heat.  PLAN FOR NEXT SESSION: HEP review and update, manual techniques as appropriate, aerobic tasks, ROM and flexibility activities, strengthening and PREs, TPDN, gait and balance training as needed. Mindful of pacemaker   Berta Minor PTA 09/08/2023 10:33 AM

## 2023-09-13 NOTE — Progress Notes (Signed)
Remote pacemaker transmission.   

## 2023-09-14 ENCOUNTER — Ambulatory Visit: Payer: Medicare Other | Attending: Family Medicine

## 2023-09-14 DIAGNOSIS — M5459 Other low back pain: Secondary | ICD-10-CM | POA: Diagnosis not present

## 2023-09-14 DIAGNOSIS — M48061 Spinal stenosis, lumbar region without neurogenic claudication: Secondary | ICD-10-CM | POA: Diagnosis not present

## 2023-09-14 DIAGNOSIS — M6281 Muscle weakness (generalized): Secondary | ICD-10-CM | POA: Diagnosis not present

## 2023-09-14 NOTE — Therapy (Signed)
OUTPATIENT PHYSICAL THERAPY TREATMENT NOTE   Patient Name: Guy Moore MRN: [id number] DOB:[birth year], 89 y.o., male Today's Date: 09/14/2023  END OF SESSION:  PT End of Session - 09/14/23 0959     Visit Number 5    Number of Visits 12    Date for PT Re-Evaluation 10/23/23    Authorization Type MCR    Progress Note Due on Visit 10    PT Start Time 1000    PT Stop Time 1038    PT Time Calculation (min) 38 min    Activity Tolerance Patient tolerated treatment well    Behavior During Therapy WFL for tasks assessed/performed             Past Medical History:  Diagnosis Date   Allergic rhinitis    Arthritis    Asthma    Atrial fibrillation (HCC)    sees Dr. Bedelia Person at [facility]    Atrial flutter (HCC)    BCC (basal cell carcinoma of skin)    Nose   Chronic kidney disease    Complication of anesthesia    no problems per patient   COPD with emphysema (HCC)    sees Dr. Coralyn Helling    Diverticulosis    Dysrhythmia    afib, flutter and tachycardia   Erosive esophagitis    Esophageal stricture    Gastric ulcer    Hearing loss    uses amplification   Hemorrhoids    Hiatal hernia    History of kidney stones    HTN (hypertension)    Hyperlipidemia    Knee problem    2% permanent partial impairment Right   Macular degeneration    Perineal abscess    Pneumonia    Pre-diabetes    Presence of permanent cardiac pacemaker    Sleep apnea    cpap   Past Surgical History:  Procedure Laterality Date   CARDIAC ELECTROPHYSIOLOGY STUDY AND ABLATION     x 4 (sees Dr. Elmer Picker at [facility])   CO2 LASER APPLICATION N/A 03/26/2023   Procedure: CO2 LASER ABLATION OF PENIS;  Surgeon: Jannifer Hick, MD;  Location: [facility];  Service: Urology;  Laterality: N/A;   CYST EXCISION PERINEAL     CYSTOSCOPY WITH URETEROSCOPY AND STENT PLACEMENT Right 10/29/2022   Procedure: CYSTOSCOPY WITH RETROGRADE WITH URETHERAL DILITATIONRIGHT DIAGNOSTIC   URETEROSCOPY AND STENT PLACEMENT;  Surgeon: Jannifer Hick, MD;  Location: [facility];  Service: Urology;  Laterality: Right;   CYSTOSCOPY/URETEROSCOPY/HOLMIUM LASER/STENT PLACEMENT Right 11/27/2022   Procedure: CYSTOSCOPY/ RIGHT RETROGRADE PYELOGRAM/RIGHT HOLMIUM LASER/RIGHT STENT PLACEMENT/BASKET;  Surgeon: Jannifer Hick, MD;  Location: [facility];  Service: Urology;  Laterality: Right;  60 MINUTES NEEDED FOR CASE   INSERT / REPLACE / REMOVE PACEMAKER     NOSE SURGERY     septoplasty   PENILE BIOPSY N/A 11/27/2022   Procedure: PENILE BIOPSY;  Surgeon: Jannifer Hick, MD;  Location: [facility];  Service: Urology;  Laterality: N/A;   TRANSURETHRAL RESECTION OF PROSTATE N/A 03/26/2023   Procedure: BIPOLAR TRANSURETHRAL RESECTION OF THE PROSTATE (TURP);  Surgeon: Jannifer Hick, MD;  Location: [facility];  Service: Urology;  Laterality: N/A;  90 MINUTES NEEDED FOR CASE   UVULOPALATOPHARYNGOPLASTY  06/13/1979   VASECTOMY  06/12/1969   w/complications   Patient Active Problem List   Diagnosis Date Noted   BPH (benign prostatic hyperplasia) 03/26/2023   Urinary retention 11/04/2022   Ureteral calculus, right 11/04/2022   History of  UTI 11/04/2022   Hydronephrosis 11/04/2022   Perineal abscess 08/04/2021   CAP (community acquired pneumonia) 05/25/2021   Dyspnea 05/25/2021   Bicytopenia 08/06/2020   B12 deficiency 05/06/2020   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   Asthma 08/26/2016   Hyperglycemia 05/29/2016   Thoracic scoliosis 12/16/2015   Thrombocytopenia (HCC) 04/12/2015   History of basal cell cancer 06/27/2014   GERD (gastroesophageal reflux disease) 08/31/2011   Anticoagulant long-term use 08/31/2011   OBSTRUCTIVE SLEEP APNEA 07/04/2009   Atrial fibrillation (HCC) 04/19/2009   Allergic rhinitis 04/14/2007   Hyperlipidemia 04/13/2007   Essential hypertension 04/13/2007    PCP: Nelwyn Salisbury, MD  REFERRING PROVIDER: Nelwyn Salisbury, MD  REFERRING DIAG: [phone] (ICD-10-CM) - Spinal stenosis of  lumbar region without neurogenic claudication  Rationale for Evaluation and Treatment: Rehabilitation  THERAPY DIAG:  Other low back pain  Muscle weakness (generalized)  Spinal stenosis of lumbar region, unspecified whether neurogenic claudication present  ONSET DATE: 6-8 weeks  SUBJECTIVE:                                                                                                                                                                                           SUBJECTIVE STATEMENT: Patient reports that he did some heavy lifting over the weekend with christmas decorating and that he has stiffness in his lower back and pain in BIL sides of groin area/adductors.    PERTINENT HISTORY:  Here for several weeks of right sided low back pain that radiates down the right buttock and thigh. No weakness or numbness. He had an MRI in 2017 that revealed a moderate to severe stenosis at L4-5. He takes Tylenol as needed.   PAIN:   NPS: "almost zero" / 10 Pain location: R low back Pain description: ache Aggravating factors: lifting, prolonged positioning Relieving factors: position changes, lying supine  PRECAUTIONS: Pacemaker present  RED FLAGS: None   WEIGHT BEARING RESTRICTIONS: No  FALLS:  Has patient fallen in last 6 months? No  OCCUPATION: retired  PLOF: Independent  PATIENT GOALS: To manage my back pain  NEXT MD VISIT: PRN  OBJECTIVE:  Note: Objective measures were completed at Evaluation unless otherwise noted.  DIAGNOSTIC FINDINGS:  IMPRESSION: 1. Nondisplaced posterior left eighth rib fracture. 2. Otherwise no acute osseous abnormality in the thoracolumbar spine or visible sacrum. 3. Overall mild for age thoracic and lumbar spine degeneration. No thoracic spinal stenosis. There is moderate to severe degenerative lumbar spinal stenosis at L4-L5, primarily due to severe posterior element degeneration.     Electronically Signed   By: [place] Fleming M.D.    On: 02/02/2016 10:39  PATIENT SURVEYS:  FOTO 30(52 predicted)  MUSCLE LENGTH: Hamstrings: Right 70 deg; Left 70 deg  POSTURE: rounded shoulders, decreased lumbar lordosis, and increased thoracic kyphosis  PALPATION: TTP R lumbar paraspinals   LUMBAR ROM: Deferred due to dizziness with movement  AROM eval  Flexion   Extension   Right lateral flexion   Left lateral flexion   Right rotation   Left rotation    (Blank rows = not tested)  LOWER EXTREMITY ROM:   WFL for gait and transfers  Active  Right eval Left eval  Hip flexion    Hip extension    Hip abduction    Hip adduction    Hip internal rotation    Hip external rotation    Knee flexion    Knee extension    Ankle dorsiflexion    Ankle plantarflexion    Ankle inversion    Ankle eversion     (Blank rows = not tested)  LOWER EXTREMITY MMT:    MMT Right eval Left eval  Hip flexion 4- 4-  Hip extension 4- 4-  Hip abduction 4- 4-  Hip adduction    Hip internal rotation    Hip external rotation    Knee flexion 4- 4-  Knee extension 4- 4-  Ankle dorsiflexion    Ankle plantarflexion 4- 4-  Ankle inversion    Ankle eversion     (Blank rows = not tested)  LUMBAR SPECIAL TESTS:  Straight leg raise test: Negative and Slump test: Negative  FUNCTIONAL TESTS:  30 seconds chair stand test 8 stands 09/02/23: 304 ft GAIT: Distance walked: 84ft x2 Assistive device utilized: None Level of assistance: Complete Independence Comments: slow cadence  TODAY'S TREATMENT:   [facility] Adult PT Treatment:                                                DATE: 09/14/23 Therapeutic Exercise: Nustep level 5 x 6 mins Standing hip abduction 2x10 BIL Standing heel toe raises at counter 2x15 4# LAQ 2x10 BIL 4# seated alternating march 3x30"  Seated hamstring curl GTB 2x10 BIL Seated hip adduction ball squeeze 3" hold 2x10 STS low mat table no UE support - 2x5   [facility] Adult PT Treatment:                                                 DATE: 09/08/23 Therapeutic Exercise: Nustep level 5 x 5 mins Standing hip abduction 2x10 BIL Step ups 4" fwd/lat x10 each BIL Standing heel toe raises at counter 2x15 Standing march 2x10 BIL LE at counter 4# LAQ 2x10 BIL 4# seated march 2x10 BIL Seated hip adduction ball squeeze 3" hold 2x10 STS low mat table - no UE support x8, x5    [facility] Adult PT Treatment:                                                DATE: 09/06/23 Therapeutic Exercise: 4# LAQ 2x10 BIL 4# seated march 2x10 BIL Standing heel toe raises at counter 2x15 Standing march 2x10 BIL LE at counter STS  standard chair x8 w/ BUE support, x8 unilat UE support HEP update + handout/education, rationale for interventions    PATIENT EDUCATION:  Education details: rationale for interventions, HEP  Person educated: Patient Education method: Explanation Education comprehension: verbalized understanding and needs further education  HOME EXERCISE PROGRAM: Access Code: [email or web address] URL: [email or web address] Date: 09/06/2023 Prepared by: Fransisco Hertz  Exercises - Sit to Stand with Armchair  - 2 x daily - 5 x weekly - 1 sets - 5 reps - Heel Toe Raises with Counter Support  - 2 x daily - 5 x weekly - 1 sets - 10 reps - Standing March with Counter Support  - 2 x daily - 5 x weekly - 1 sets - 8 reps  ASSESSMENT:  CLINICAL IMPRESSION: Patient presents to PT reporting some pain today due to lifting heavy objects this weekend while decorating for Christmas. He does report increased ease with functional activities compared to a few weeks ago. Session today slightly regressed in difficulty to accommodate increased pain. Patient was able to tolerate all prescribed exercises with no adverse effects. Patient continues to benefit from skilled PT services and should be progressed as able to improve functional independence.    OBJECTIVE IMPAIRMENTS: Abnormal gait, decreased activity tolerance, decreased endurance,  decreased knowledge of condition, difficulty walking, decreased strength, improper body mechanics, postural dysfunction, obesity, and pain.   ACTIVITY LIMITATIONS: carrying, lifting, bending, standing, squatting, and stairs  PERSONAL FACTORS: Age, Fitness, Time since onset of injury/illness/exacerbation, and 1 comorbidity: underlying stenosis  are also affecting patient's functional outcome.   REHAB POTENTIAL: Good  CLINICAL DECISION MAKING: Stable/uncomplicated  EVALUATION COMPLEXITY: Low   GOALS: Goals reviewed with patient? No  SHORT TERM GOALS: Target date: 09/13/2023    Patient to demonstrate independence in HEP  Baseline: [id number] Goal status: INITIAL  2.  Patient to stand from chair w/o need of UE Support Baseline: Needs BUE support Goal status: INITIAL  3.  Assess 2 MWT and establish goal Baseline: TBD Goal status: INITIAL    LONG TERM GOALS: Target date: 10/04/2023    Patient will score at least 52% on FOTO to signify clinically meaningful improvement in functional abilities.   Baseline: 30% Goal status: INITIAL  2.  Patient will increase 30s chair stand reps from 8 to 10 with/without arms to demonstrate and improved functional ability with less pain/difficulty as well as reduce fall risk.  Baseline: 8 Goal status: INITIAL  3.  Patient will acknowledge 4/10 pain at least once during episode of care  Baseline: 8/10 Goal status: INITIAL  4.  4/5 BLE strength throughout Baseline:  MMT Right eval Left eval  Hip flexion 4- 4-  Hip extension 4- 4-  Hip abduction 4- 4-  Hip adduction    Hip internal rotation    Hip external rotation    Knee flexion 4- 4-  Knee extension 4- 4-  Ankle dorsiflexion    Ankle plantarflexion 4- 4-   Goal status: INITIAL  5.  Assess progress on 2 MWT Baseline: TBD Goal status: INITIAL  6.  Decrease tenderness to R lumbar paraspinals to minimal Baseline: Moderate tenderness Goal status: INITIAL  PLAN:  PT  FREQUENCY: 1-2x/week  PT DURATION: 6 weeks  PLANNED INTERVENTIONS: 97164- PT Re-evaluation, 97110-Therapeutic exercises, 97530- Therapeutic activity, 97112- Neuromuscular re-education, 97535- Self Care, 78295- Manual therapy, [phone]- Gait training, [phone]- Electrical stimulation (unattended), Patient/Family education, Balance training, Stair training, Dry Needling, Joint mobilization, Spinal mobilization, Cryotherapy, and Moist heat.  PLAN FOR NEXT SESSION: HEP review and  update, manual techniques as appropriate, aerobic tasks, ROM and flexibility activities, strengthening and PREs, TPDN, gait and balance training as needed. Mindful of pacemaker   Berta Minor PTA 09/14/2023 10:43 AM

## 2023-09-16 ENCOUNTER — Ambulatory Visit: Payer: Medicare Other

## 2023-09-16 DIAGNOSIS — M5459 Other low back pain: Secondary | ICD-10-CM

## 2023-09-16 DIAGNOSIS — M48061 Spinal stenosis, lumbar region without neurogenic claudication: Secondary | ICD-10-CM

## 2023-09-16 DIAGNOSIS — M6281 Muscle weakness (generalized): Secondary | ICD-10-CM | POA: Diagnosis not present

## 2023-09-16 NOTE — Therapy (Signed)
OUTPATIENT PHYSICAL THERAPY TREATMENT NOTE   Patient Name: Guy Moore MRN: [id number] DOB:[birth year], 89 y.o., male Today's Date: 09/16/2023  END OF SESSION:  PT End of Session - 09/16/23 0958     Visit Number 6    Number of Visits 12    Date for PT Re-Evaluation 10/23/23    Authorization Type MCR    Progress Note Due on Visit 10    PT Start Time 1000    PT Stop Time 1040    PT Time Calculation (min) 40 min    Activity Tolerance Patient tolerated treatment well    Behavior During Therapy WFL for tasks assessed/performed              Past Medical History:  Diagnosis Date   Allergic rhinitis    Arthritis    Asthma    Atrial fibrillation (HCC)    sees Dr. Bedelia Person at [facility]    Atrial flutter (HCC)    BCC (basal cell carcinoma of skin)    Nose   Chronic kidney disease    Complication of anesthesia    no problems per patient   COPD with emphysema (HCC)    sees Dr. Coralyn Helling    Diverticulosis    Dysrhythmia    afib, flutter and tachycardia   Erosive esophagitis    Esophageal stricture    Gastric ulcer    Hearing loss    uses amplification   Hemorrhoids    Hiatal hernia    History of kidney stones    HTN (hypertension)    Hyperlipidemia    Knee problem    2% permanent partial impairment Right   Macular degeneration    Perineal abscess    Pneumonia    Pre-diabetes    Presence of permanent cardiac pacemaker    Sleep apnea    cpap   Past Surgical History:  Procedure Laterality Date   CARDIAC ELECTROPHYSIOLOGY STUDY AND ABLATION     x 4 (sees Dr. Elmer Picker at [facility])   CO2 LASER APPLICATION N/A 03/26/2023   Procedure: CO2 LASER ABLATION OF PENIS;  Surgeon: Jannifer Hick, MD;  Location: [facility];  Service: Urology;  Laterality: N/A;   CYST EXCISION PERINEAL     CYSTOSCOPY WITH URETEROSCOPY AND STENT PLACEMENT Right 10/29/2022   Procedure: CYSTOSCOPY WITH RETROGRADE WITH URETHERAL DILITATIONRIGHT DIAGNOSTIC   URETEROSCOPY AND STENT PLACEMENT;  Surgeon: Jannifer Hick, MD;  Location: [facility];  Service: Urology;  Laterality: Right;   CYSTOSCOPY/URETEROSCOPY/HOLMIUM LASER/STENT PLACEMENT Right 11/27/2022   Procedure: CYSTOSCOPY/ RIGHT RETROGRADE PYELOGRAM/RIGHT HOLMIUM LASER/RIGHT STENT PLACEMENT/BASKET;  Surgeon: Jannifer Hick, MD;  Location: [facility];  Service: Urology;  Laterality: Right;  60 MINUTES NEEDED FOR CASE   INSERT / REPLACE / REMOVE PACEMAKER     NOSE SURGERY     septoplasty   PENILE BIOPSY N/A 11/27/2022   Procedure: PENILE BIOPSY;  Surgeon: Jannifer Hick, MD;  Location: [facility];  Service: Urology;  Laterality: N/A;   TRANSURETHRAL RESECTION OF PROSTATE N/A 03/26/2023   Procedure: BIPOLAR TRANSURETHRAL RESECTION OF THE PROSTATE (TURP);  Surgeon: Jannifer Hick, MD;  Location: [facility];  Service: Urology;  Laterality: N/A;  90 MINUTES NEEDED FOR CASE   UVULOPALATOPHARYNGOPLASTY  06/13/1979   VASECTOMY  06/12/1969   w/complications   Patient Active Problem List   Diagnosis Date Noted   BPH (benign prostatic hyperplasia) 03/26/2023   Urinary retention 11/04/2022   Ureteral calculus, right 11/04/2022   History  of UTI 11/04/2022   Hydronephrosis 11/04/2022   Perineal abscess 08/04/2021   CAP (community acquired pneumonia) 05/25/2021   Dyspnea 05/25/2021   Bicytopenia 08/06/2020   B12 deficiency 05/06/2020   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   Asthma 08/26/2016   Hyperglycemia 05/29/2016   Thoracic scoliosis 12/16/2015   Thrombocytopenia (HCC) 04/12/2015   History of basal cell cancer 06/27/2014   GERD (gastroesophageal reflux disease) 08/31/2011   Anticoagulant long-term use 08/31/2011   OBSTRUCTIVE SLEEP APNEA 07/04/2009   Atrial fibrillation (HCC) 04/19/2009   Allergic rhinitis 04/14/2007   Hyperlipidemia 04/13/2007   Essential hypertension 04/13/2007    PCP: Nelwyn Salisbury, MD  REFERRING PROVIDER: Nelwyn Salisbury, MD  REFERRING DIAG: [phone] (ICD-10-CM) - Spinal stenosis of  lumbar region without neurogenic claudication  Rationale for Evaluation and Treatment: Rehabilitation  THERAPY DIAG:  Other low back pain  Muscle weakness (generalized)  Spinal stenosis of lumbar region, unspecified whether neurogenic claudication present  ONSET DATE: 6-8 weeks  SUBJECTIVE:                                                                                                                                                                                           SUBJECTIVE STATEMENT: Patient reports decreased soreness from the other day, but that he feels more stiffness in his lower back today from doing more lifting related to Christmas decorating.   PERTINENT HISTORY:  Here for several weeks of right sided low back pain that radiates down the right buttock and thigh. No weakness or numbness. He had an MRI in 2017 that revealed a moderate to severe stenosis at L4-5. He takes Tylenol as needed.   PAIN:   NPS: "almost zero" / 10 Pain location: R low back Pain description: ache Aggravating factors: lifting, prolonged positioning Relieving factors: position changes, lying supine  PRECAUTIONS: Pacemaker present  RED FLAGS: None   WEIGHT BEARING RESTRICTIONS: No  FALLS:  Has patient fallen in last 6 months? No  OCCUPATION: retired  PLOF: Independent  PATIENT GOALS: To manage my back pain  NEXT MD VISIT: PRN  OBJECTIVE:  Note: Objective measures were completed at Evaluation unless otherwise noted.  DIAGNOSTIC FINDINGS:  IMPRESSION: 1. Nondisplaced posterior left eighth rib fracture. 2. Otherwise no acute osseous abnormality in the thoracolumbar spine or visible sacrum. 3. Overall mild for age thoracic and lumbar spine degeneration. No thoracic spinal stenosis. There is moderate to severe degenerative lumbar spinal stenosis at L4-L5, primarily due to severe posterior element degeneration.     Electronically Signed   By: Odessa Fleming M.D.   On: 02/02/2016  10:39  PATIENT SURVEYS:  FOTO  30(52 predicted)  MUSCLE LENGTH: Hamstrings: Right 70 deg; Left 70 deg  POSTURE: rounded shoulders, decreased lumbar lordosis, and increased thoracic kyphosis  PALPATION: TTP R lumbar paraspinals   LUMBAR ROM: Deferred due to dizziness with movement  AROM eval  Flexion   Extension   Right lateral flexion   Left lateral flexion   Right rotation   Left rotation    (Blank rows = not tested)  LOWER EXTREMITY ROM:   WFL for gait and transfers  Active  Right eval Left eval  Hip flexion    Hip extension    Hip abduction    Hip adduction    Hip internal rotation    Hip external rotation    Knee flexion    Knee extension    Ankle dorsiflexion    Ankle plantarflexion    Ankle inversion    Ankle eversion     (Blank rows = not tested)  LOWER EXTREMITY MMT:    MMT Right eval Left eval  Hip flexion 4- 4-  Hip extension 4- 4-  Hip abduction 4- 4-  Hip adduction    Hip internal rotation    Hip external rotation    Knee flexion 4- 4-  Knee extension 4- 4-  Ankle dorsiflexion    Ankle plantarflexion 4- 4-  Ankle inversion    Ankle eversion     (Blank rows = not tested)  LUMBAR SPECIAL TESTS:  Straight leg raise test: Negative and Slump test: Negative  FUNCTIONAL TESTS:  30 seconds chair stand test 8 stands 09/02/23: 304 ft GAIT: Distance walked: 11ft x2 Assistive device utilized: None Level of assistance: Complete Independence Comments: slow cadence  TODAY'S TREATMENT:   [facility] Adult PT Treatment:                                                DATE: 09/16/23 Therapeutic Exercise: Nustep level 5 x 6 mins Standing hip abduction/extension 2x10 ea BIL Sidestepping at counter no UE support x 4 laps Standing heel toe raises at counter 2x15 Standing march 2x30" Omega knee extension 10# 2x10 Omega knee flexion 20# 2x10 Seated alternating march 2x30"  Seated hip adduction ball squeeze 3" hold 2x10 STS low mat table UE support  - 2x5   [facility] Adult PT Treatment:                                                DATE: 09/14/23 Therapeutic Exercise: Nustep level 5 x 6 mins Standing hip abduction 2x10 BIL Standing heel toe raises at counter 2x15 4# LAQ 2x10 BIL 4# seated alternating march 3x30"  Seated hamstring curl GTB 2x10 BIL Seated hip adduction ball squeeze 3" hold 2x10 STS low mat table no UE support - 2x5   [facility] Adult PT Treatment:                                                DATE: 09/08/23 Therapeutic Exercise: Nustep level 5 x 5 mins Standing hip abduction 2x10 BIL Step ups 4" fwd/lat x10 each BIL Standing heel toe raises at counter 2x15  Standing march 2x10 BIL LE at counter 4# LAQ 2x10 BIL 4# seated march 2x10 BIL Seated hip adduction ball squeeze 3" hold 2x10 STS low mat table - no UE support x8, x5    PATIENT EDUCATION:  Education details: rationale for interventions, HEP  Person educated: Patient Education method: Explanation Education comprehension: verbalized understanding and needs further education  HOME EXERCISE PROGRAM: Access Code: [email or web address] URL: [email or web address] Date: 09/06/2023 Prepared by: Fransisco Hertz  Exercises - Sit to Stand with Armchair  - 2 x daily - 5 x weekly - 1 sets - 5 reps - Heel Toe Raises with Counter Support  - 2 x daily - 5 x weekly - 1 sets - 10 reps - Standing March with Counter Support  - 2 x daily - 5 x weekly - 1 sets - 8 reps  ASSESSMENT:  CLINICAL IMPRESSION: Patient presents to PT reporting decreased overall soreness, but some continued stiffness from lifting boxes and putting up Christmas decorations. Session today focused on standing exercises and LE strengthening with increased difficulty today to good effect, no reports of increased pain, only minimal muscle fatigue. Patient was able to tolerate all prescribed exercises with no adverse effects. Patient continues to benefit from skilled PT services and should be progressed as  able to improve functional independence.    OBJECTIVE IMPAIRMENTS: Abnormal gait, decreased activity tolerance, decreased endurance, decreased knowledge of condition, difficulty walking, decreased strength, improper body mechanics, postural dysfunction, obesity, and pain.   ACTIVITY LIMITATIONS: carrying, lifting, bending, standing, squatting, and stairs  PERSONAL FACTORS: Age, Fitness, Time since onset of injury/illness/exacerbation, and 1 comorbidity: underlying stenosis  are also affecting patient's functional outcome.   REHAB POTENTIAL: Good  CLINICAL DECISION MAKING: Stable/uncomplicated  EVALUATION COMPLEXITY: Low   GOALS: Goals reviewed with patient? No  SHORT TERM GOALS: Target date: 09/13/2023    Patient to demonstrate independence in HEP  Baseline: [id number] Goal status: MET Pt reports adherence 09/16/23  2.  Patient to stand from chair w/o need of UE Support Baseline: Needs BUE support Goal status: Progressing  3.  Assess 2 MWT and establish goal Baseline: TBD Goal status: MET Assessed 09/02/23 304 ft    LONG TERM GOALS: Target date: 10/04/2023    Patient will score at least 52% on FOTO to signify clinically meaningful improvement in functional abilities.   Baseline: 30% Goal status: INITIAL  2.  Patient will increase 30s chair stand reps from 8 to 10 with/without arms to demonstrate and improved functional ability with less pain/difficulty as well as reduce fall risk.  Baseline: 8 Goal status: INITIAL  3.  Patient will acknowledge 4/10 pain at least once during episode of care  Baseline: 8/10 Goal status: INITIAL  4.  4/5 BLE strength throughout Baseline:  MMT Right eval Left eval  Hip flexion 4- 4-  Hip extension 4- 4-  Hip abduction 4- 4-  Hip adduction    Hip internal rotation    Hip external rotation    Knee flexion 4- 4-  Knee extension 4- 4-  Ankle dorsiflexion    Ankle plantarflexion 4- 4-   Goal status: INITIAL  5.  Assess  progress on 2 MWT Baseline: TBD Goal status: INITIAL  6.  Decrease tenderness to R lumbar paraspinals to minimal Baseline: Moderate tenderness Goal status: INITIAL  PLAN:  PT FREQUENCY: 1-2x/week  PT DURATION: 6 weeks  PLANNED INTERVENTIONS: 97164- PT Re-evaluation, 97110-Therapeutic exercises, 97530- Therapeutic activity, 97112- Neuromuscular re-education, 97535- Self Care, [id number]- Manual therapy, [id number]- Gait training, 97014-  Electrical stimulation (unattended), Patient/Family education, Balance training, Stair training, Dry Needling, Joint mobilization, Spinal mobilization, Cryotherapy, and Moist heat.  PLAN FOR NEXT SESSION: HEP review and update, manual techniques as appropriate, aerobic tasks, ROM and flexibility activities, strengthening and PREs, TPDN, gait and balance training as needed. Mindful of pacemaker   Berta Minor PTA 09/16/2023 9:59 AM

## 2023-09-17 NOTE — Therapy (Unsigned)
OUTPATIENT PHYSICAL THERAPY TREATMENT NOTE   Patient Name: Guy Moore MRN: [id number] DOB:1934/01/04, 89 y.o., male Today's Date: 09/17/2023  END OF SESSION:     Past Medical History:  Diagnosis Date   Allergic rhinitis    Arthritis    Asthma    Atrial fibrillation [facility])    sees Dr. 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 Coralyn Helling    Diverticulosis    Dysrhythmia    afib, flutter and tachycardia   Erosive esophagitis    Esophageal stricture    Gastric ulcer    Hearing loss    uses amplification   Hemorrhoids    Hiatal hernia    History of kidney stones    HTN (hypertension)    Hyperlipidemia    Knee problem    2% permanent partial impairment Right   Macular degeneration    Perineal abscess    Pneumonia    Pre-diabetes    Presence of permanent cardiac pacemaker    Sleep apnea    cpap   Past Surgical History:  Procedure Laterality Date   CARDIAC ELECTROPHYSIOLOGY STUDY AND ABLATION     x 4 (sees Dr. Elmer Picker at [facility])   CO2 LASER APPLICATION N/A 03/26/2023   Procedure: CO2 LASER ABLATION OF PENIS;  Surgeon: Jannifer Hick, MD;  Location: [facility];  Service: Urology;  Laterality: N/A;   CYST EXCISION PERINEAL     CYSTOSCOPY WITH URETEROSCOPY AND STENT PLACEMENT Right 10/29/2022   Procedure: CYSTOSCOPY WITH RETROGRADE WITH URETHERAL DILITATIONRIGHT DIAGNOSTIC  URETEROSCOPY AND STENT PLACEMENT;  Surgeon: Jannifer Hick, MD;  Location: [facility];  Service: Urology;  Laterality: Right;   CYSTOSCOPY/URETEROSCOPY/HOLMIUM LASER/STENT PLACEMENT Right 11/27/2022   Procedure: CYSTOSCOPY/ RIGHT RETROGRADE PYELOGRAM/RIGHT HOLMIUM LASER/RIGHT STENT PLACEMENT/BASKET;  Surgeon: Jannifer Hick, MD;  Location: [facility];  Service: Urology;  Laterality: Right;  60 MINUTES  NEEDED FOR CASE   INSERT / REPLACE / REMOVE PACEMAKER     NOSE SURGERY     septoplasty   PENILE BIOPSY N/A 11/27/2022   Procedure: PENILE BIOPSY;  Surgeon: Jannifer Hick, MD;  Location: [facility];  Service: Urology;  Laterality: N/A;   TRANSURETHRAL RESECTION OF PROSTATE N/A 03/26/2023   Procedure: BIPOLAR TRANSURETHRAL RESECTION OF THE PROSTATE (TURP);  Surgeon: Jannifer Hick, MD;  Location: [facility];  Service: Urology;  Laterality: N/A;  90 MINUTES NEEDED FOR CASE   UVULOPALATOPHARYNGOPLASTY  06/13/1979   VASECTOMY  06/12/1969   w/complications   Patient Active Problem List   Diagnosis Date Noted   BPH (benign prostatic hyperplasia) 03/26/2023   Urinary retention 11/04/2022   Ureteral calculus, right 11/04/2022   History of UTI 11/04/2022   Hydronephrosis 11/04/2022   Perineal abscess 08/04/2021   CAP (community acquired pneumonia) 05/25/2021   Dyspnea 05/25/2021   Bicytopenia 08/06/2020   B12 deficiency 05/06/2020   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   Asthma 08/26/2016   Hyperglycemia 05/29/2016   Thoracic scoliosis 12/16/2015   Thrombocytopenia (HCC) 04/12/2015   History of basal cell cancer 06/27/2014   GERD (gastroesophageal reflux disease) 08/31/2011   Anticoagulant long-term use 08/31/2011   OBSTRUCTIVE SLEEP APNEA 07/04/2009   Atrial fibrillation (HCC) 04/19/2009   Allergic rhinitis 04/14/2007  Hyperlipidemia 04/13/2007   Essential hypertension 04/13/2007    PCP: Nelwyn Salisbury, MD  REFERRING PROVIDER: Nelwyn Salisbury, MD  REFERRING DIAG: [phone] (ICD-10-CM) - Spinal stenosis of lumbar region without neurogenic claudication  Rationale for Evaluation and Treatment: Rehabilitation  THERAPY DIAG:  No diagnosis found.  ONSET DATE: 6-8 weeks  SUBJECTIVE:                                                                                                                                                                                           SUBJECTIVE  STATEMENT: Patient reports decreased soreness from the other day, but that he feels more stiffness in his lower back today from doing more lifting related to Christmas decorating.   PERTINENT HISTORY:  Here for several weeks of right sided low back pain that radiates down the right buttock and thigh. No weakness or numbness. He had an MRI in 2017 that revealed a moderate to severe stenosis at L4-5. He takes Tylenol as needed.   PAIN:   NPS: "almost zero" / 10 Pain location: R low back Pain description: ache Aggravating factors: lifting, prolonged positioning Relieving factors: position changes, lying supine  PRECAUTIONS: Pacemaker present  RED FLAGS: None   WEIGHT BEARING RESTRICTIONS: No  FALLS:  Has patient fallen in last 6 months? No  OCCUPATION: retired  PLOF: Independent  PATIENT GOALS: To manage my back pain  NEXT MD VISIT: PRN  OBJECTIVE:  Note: Objective measures were completed at Evaluation unless otherwise noted.  DIAGNOSTIC FINDINGS:  IMPRESSION: 1. Nondisplaced posterior left eighth rib fracture. 2. Otherwise no acute osseous abnormality in the thoracolumbar spine or visible sacrum. 3. Overall mild for age thoracic and lumbar spine degeneration. No thoracic spinal stenosis. There is moderate to severe degenerative lumbar spinal stenosis at L4-L5, primarily due to severe posterior element degeneration.     Electronically Signed   By: Odessa Fleming M.D.   On: 02/02/2016 10:39  PATIENT SURVEYS:  FOTO 30(52 predicted)  MUSCLE LENGTH: Hamstrings: Right 70 deg; Left 70 deg  POSTURE: rounded shoulders, decreased lumbar lordosis, and increased thoracic kyphosis  PALPATION: TTP R lumbar paraspinals   LUMBAR ROM: Deferred due to dizziness with movement  AROM eval  Flexion   Extension   Right lateral flexion   Left lateral flexion   Right rotation   Left rotation    (Blank rows = not tested)  LOWER EXTREMITY ROM:   WFL for gait and  transfers  Active  Right eval Left eval  Hip flexion    Hip extension    Hip abduction    Hip adduction    Hip  internal rotation    Hip external rotation    Knee flexion    Knee extension    Ankle dorsiflexion    Ankle plantarflexion    Ankle inversion    Ankle eversion     (Blank rows = not tested)  LOWER EXTREMITY MMT:    MMT Right eval Left eval  Hip flexion 4- 4-  Hip extension 4- 4-  Hip abduction 4- 4-  Hip adduction    Hip internal rotation    Hip external rotation    Knee flexion 4- 4-  Knee extension 4- 4-  Ankle dorsiflexion    Ankle plantarflexion 4- 4-  Ankle inversion    Ankle eversion     (Blank rows = not tested)  LUMBAR SPECIAL TESTS:  Straight leg raise test: Negative and Slump test: Negative  FUNCTIONAL TESTS:  30 seconds chair stand test 8 stands 09/02/23: 304 ft GAIT: Distance walked: 36ft x2 Assistive device utilized: None Level of assistance: Complete Independence Comments: slow cadence  TODAY'S TREATMENT:   [facility] Adult PT Treatment:                                                DATE: 09/16/23 Therapeutic Exercise: Nustep level 5 x 6 mins Standing hip abduction/extension 2x10 ea BIL Sidestepping at counter no UE support x 4 laps Standing heel toe raises at counter 2x15 Standing march 2x30" Omega knee extension 10# 2x10 Omega knee flexion 20# 2x10 Seated alternating march 2x30"  Seated hip adduction ball squeeze 3" hold 2x10 STS low mat table UE support - 2x5   [facility] Adult PT Treatment:                                                DATE: 09/14/23 Therapeutic Exercise: Nustep level 5 x 6 mins Standing hip abduction 2x10 BIL Standing heel toe raises at counter 2x15 4# LAQ 2x10 BIL 4# seated alternating march 3x30"  Seated hamstring curl GTB 2x10 BIL Seated hip adduction ball squeeze 3" hold 2x10 STS low mat table no UE support - 2x5   [facility] Adult PT Treatment:                                                DATE:  09/08/23 Therapeutic Exercise: Nustep level 5 x 5 mins Standing hip abduction 2x10 BIL Step ups 4" fwd/lat x10 each BIL Standing heel toe raises at counter 2x15 Standing march 2x10 BIL LE at counter 4# LAQ 2x10 BIL 4# seated march 2x10 BIL Seated hip adduction ball squeeze 3" hold 2x10 STS low mat table - no UE support x8, x5    PATIENT EDUCATION:  Education details: rationale for interventions, HEP  Person educated: Patient Education method: Explanation Education comprehension: verbalized understanding and needs further education  HOME EXERCISE PROGRAM: Access Code: [email or web address] URL: [email or web address] Date: 09/06/2023 Prepared by: Fransisco Hertz  Exercises - Sit to Stand with Armchair  - 2 x daily - 5 x weekly - 1 sets - 5 reps - Heel Toe Raises with Counter Support  - 2 x daily - 5  x weekly - 1 sets - 10 reps - Standing March with Counter Support  - 2 x daily - 5 x weekly - 1 sets - 8 reps  ASSESSMENT:  CLINICAL IMPRESSION: Patient presents to PT reporting decreased overall soreness, but some continued stiffness from lifting boxes and putting up Christmas decorations. Session today focused on standing exercises and LE strengthening with increased difficulty today to good effect, no reports of increased pain, only minimal muscle fatigue. Patient was able to tolerate all prescribed exercises with no adverse effects. Patient continues to benefit from skilled PT services and should be progressed as able to improve functional independence.    OBJECTIVE IMPAIRMENTS: Abnormal gait, decreased activity tolerance, decreased endurance, decreased knowledge of condition, difficulty walking, decreased strength, improper body mechanics, postural dysfunction, obesity, and pain.   ACTIVITY LIMITATIONS: carrying, lifting, bending, standing, squatting, and stairs  PERSONAL FACTORS: Age, Fitness, Time since onset of injury/illness/exacerbation, and 1 comorbidity: underlying  stenosis  are also affecting patient's functional outcome.   REHAB POTENTIAL: Good  CLINICAL DECISION MAKING: Stable/uncomplicated  EVALUATION COMPLEXITY: Low   GOALS: Goals reviewed with patient? No  SHORT TERM GOALS: Target date: 09/13/2023    Patient to demonstrate independence in HEP  Baseline: [id number] Goal status: MET Pt reports adherence 09/16/23  2.  Patient to stand from chair w/o need of UE Support Baseline: Needs BUE support Goal status: Progressing  3.  Assess 2 MWT and establish goal Baseline: TBD Goal status: MET Assessed 09/02/23 304 ft    LONG TERM GOALS: Target date: 10/04/2023    Patient will score at least 52% on FOTO to signify clinically meaningful improvement in functional abilities.   Baseline: 30% Goal status: INITIAL  2.  Patient will increase 30s chair stand reps from 8 to 10 with/without arms to demonstrate and improved functional ability with less pain/difficulty as well as reduce fall risk.  Baseline: 8 Goal status: INITIAL  3.  Patient will acknowledge 4/10 pain at least once during episode of care  Baseline: 8/10 Goal status: INITIAL  4.  4/5 BLE strength throughout Baseline:  MMT Right eval Left eval  Hip flexion 4- 4-  Hip extension 4- 4-  Hip abduction 4- 4-  Hip adduction    Hip internal rotation    Hip external rotation    Knee flexion 4- 4-  Knee extension 4- 4-  Ankle dorsiflexion    Ankle plantarflexion 4- 4-   Goal status: INITIAL  5.  Assess progress on 2 MWT Baseline: TBD Goal status: INITIAL  6.  Decrease tenderness to R lumbar paraspinals to minimal Baseline: Moderate tenderness Goal status: INITIAL  PLAN:  PT FREQUENCY: 1-2x/week  PT DURATION: 6 weeks  PLANNED INTERVENTIONS: [id number]- PT Re-evaluation, 97110-Therapeutic exercises, 97530- Therapeutic activity, 97112- Neuromuscular re-education, 97535- Self Care, [id number]- Manual therapy, [phone]- Gait training, [phone]- Electrical stimulation (unattended),  Patient/Family education, Balance training, Stair training, Dry Needling, Joint mobilization, Spinal mobilization, Cryotherapy, and Moist heat.  PLAN FOR NEXT SESSION: HEP review and update, manual techniques as appropriate, aerobic tasks, ROM and flexibility activities, strengthening and PREs, TPDN, gait and balance training as needed. Mindful of pacemaker   Hildred Laser PT 09/17/2023 8:55 AM

## 2023-09-21 ENCOUNTER — Ambulatory Visit: Payer: Medicare Other

## 2023-09-21 DIAGNOSIS — M48061 Spinal stenosis, lumbar region without neurogenic claudication: Secondary | ICD-10-CM

## 2023-09-21 DIAGNOSIS — M6281 Muscle weakness (generalized): Secondary | ICD-10-CM

## 2023-09-21 DIAGNOSIS — M5459 Other low back pain: Secondary | ICD-10-CM | POA: Diagnosis not present

## 2023-09-23 ENCOUNTER — Ambulatory Visit: Payer: Medicare Other

## 2023-09-23 DIAGNOSIS — M5459 Other low back pain: Secondary | ICD-10-CM | POA: Diagnosis not present

## 2023-09-23 DIAGNOSIS — M6281 Muscle weakness (generalized): Secondary | ICD-10-CM

## 2023-09-23 DIAGNOSIS — M48061 Spinal stenosis, lumbar region without neurogenic claudication: Secondary | ICD-10-CM | POA: Diagnosis not present

## 2023-09-23 NOTE — Therapy (Signed)
OUTPATIENT PHYSICAL THERAPY TREATMENT NOTE   Patient Name: Guy Moore MRN: [id number] DOB:04/19/1934, 89 y.o., male Today's Date: 09/23/2023  END OF SESSION:  PT End of Session - 09/23/23 0958     Visit Number 8    Number of Visits 12    Date for PT Re-Evaluation 10/23/23    Authorization Type MCR    Progress Note Due on Visit 10    PT Start Time 1000    PT Stop Time 1040    PT Time Calculation (min) 40 min    Activity Tolerance Patient tolerated treatment well    Behavior During Therapy WFL for tasks assessed/performed                Past Medical History:  Diagnosis Date   Allergic rhinitis    Arthritis    Asthma    Atrial fibrillation (HCC)    sees Dr. Bedelia Person at [facility]    Atrial flutter (HCC)    BCC (basal cell carcinoma of skin)    Nose   Chronic kidney disease    Complication of anesthesia    no problems per patient   COPD with emphysema (HCC)    sees Dr. Coralyn Helling    Diverticulosis    Dysrhythmia    afib, flutter and tachycardia   Erosive esophagitis    Esophageal stricture    Gastric ulcer    Hearing loss    uses amplification   Hemorrhoids    Hiatal hernia    History of kidney stones    HTN (hypertension)    Hyperlipidemia    Knee problem    2% permanent partial impairment Right   Macular degeneration    Perineal abscess    Pneumonia    Pre-diabetes    Presence of permanent cardiac pacemaker    Sleep apnea    cpap   Past Surgical History:  Procedure Laterality Date   CARDIAC ELECTROPHYSIOLOGY STUDY AND ABLATION     x 4 (sees Dr. Elmer Picker at [facility])   CO2 LASER APPLICATION N/A 03/26/2023   Procedure: CO2 LASER ABLATION OF PENIS;  Surgeon: Jannifer Hick, MD;  Location: [facility];  Service: Urology;  Laterality: N/A;   CYST EXCISION PERINEAL     CYSTOSCOPY WITH URETEROSCOPY AND STENT PLACEMENT Right 10/29/2022   Procedure: CYSTOSCOPY WITH RETROGRADE WITH URETHERAL DILITATIONRIGHT DIAGNOSTIC   URETEROSCOPY AND STENT PLACEMENT;  Surgeon: Jannifer Hick, MD;  Location: [facility];  Service: Urology;  Laterality: Right;   CYSTOSCOPY/URETEROSCOPY/HOLMIUM LASER/STENT PLACEMENT Right 11/27/2022   Procedure: CYSTOSCOPY/ RIGHT RETROGRADE PYELOGRAM/RIGHT HOLMIUM LASER/RIGHT STENT PLACEMENT/BASKET;  Surgeon: Jannifer Hick, MD;  Location: [facility];  Service: Urology;  Laterality: Right;  60 MINUTES NEEDED FOR CASE   INSERT / REPLACE / REMOVE PACEMAKER     NOSE SURGERY     septoplasty   PENILE BIOPSY N/A 11/27/2022   Procedure: PENILE BIOPSY;  Surgeon: Jannifer Hick, MD;  Location: [facility];  Service: Urology;  Laterality: N/A;   TRANSURETHRAL RESECTION OF PROSTATE N/A 03/26/2023   Procedure: BIPOLAR TRANSURETHRAL RESECTION OF THE PROSTATE (TURP);  Surgeon: Jannifer Hick, MD;  Location: [facility];  Service: Urology;  Laterality: N/A;  90 MINUTES NEEDED FOR CASE   UVULOPALATOPHARYNGOPLASTY  06/13/1979   VASECTOMY  06/12/1969   w/complications   Patient Active Problem List   Diagnosis Date Noted   BPH (benign prostatic hyperplasia) 03/26/2023   Urinary retention 11/04/2022   Ureteral calculus, right 11/04/2022  History of UTI 11/04/2022   Hydronephrosis 11/04/2022   Perineal abscess 08/04/2021   CAP (community acquired pneumonia) 05/25/2021   Dyspnea 05/25/2021   Bicytopenia 08/06/2020   B12 deficiency 05/06/2020   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   Asthma 08/26/2016   Hyperglycemia 05/29/2016   Thoracic scoliosis 12/16/2015   Thrombocytopenia (HCC) 04/12/2015   History of basal cell cancer 06/27/2014   GERD (gastroesophageal reflux disease) 08/31/2011   Anticoagulant long-term use 08/31/2011   OBSTRUCTIVE SLEEP APNEA 07/04/2009   Atrial fibrillation (HCC) 04/19/2009   Allergic rhinitis 04/14/2007   Hyperlipidemia 04/13/2007   Essential hypertension 04/13/2007    PCP: Nelwyn Salisbury, MD  REFERRING PROVIDER: Nelwyn Salisbury, MD  REFERRING DIAG: [phone] (ICD-10-CM) - Spinal stenosis of  lumbar region without neurogenic claudication  Rationale for Evaluation and Treatment: Rehabilitation  THERAPY DIAG:  Muscle weakness (generalized)  Other low back pain  Spinal stenosis of lumbar region, unspecified whether neurogenic claudication present  ONSET DATE: 6-8 weeks  SUBJECTIVE:                                                                                                                                                                                           SUBJECTIVE STATEMENT: Patient reports some lower back pain that he partially attributes to needing to get under the Christmas tree to water it.   PERTINENT HISTORY:  Here for several weeks of right sided low back pain that radiates down the right buttock and thigh. No weakness or numbness. He had an MRI in 2017 that revealed a moderate to severe stenosis at L4-5. He takes Tylenol as needed.   PAIN:   NPS: "almost zero" / 10 Pain location: R low back Pain description: ache Aggravating factors: lifting, prolonged positioning Relieving factors: position changes, lying supine  PRECAUTIONS: Pacemaker present  RED FLAGS: None   WEIGHT BEARING RESTRICTIONS: No  FALLS:  Has patient fallen in last 6 months? No  OCCUPATION: retired  PLOF: Independent  PATIENT GOALS: To manage my back pain  NEXT MD VISIT: PRN  OBJECTIVE:  Note: Objective measures were completed at Evaluation unless otherwise noted.  DIAGNOSTIC FINDINGS:  IMPRESSION: 1. Nondisplaced posterior left eighth rib fracture. 2. Otherwise no acute osseous abnormality in the thoracolumbar spine or visible sacrum. 3. Overall mild for age thoracic and lumbar spine degeneration. No thoracic spinal stenosis. There is moderate to severe degenerative lumbar spinal stenosis at L4-L5, primarily due to severe posterior element degeneration.     Electronically Signed   By: Odessa Fleming M.D.   On: 02/02/2016 10:39  PATIENT SURVEYS:  FOTO 30(52 predicted)   09/21/23 82%  MUSCLE LENGTH: Hamstrings: Right 70 deg; Left 70 deg  POSTURE: rounded shoulders, decreased lumbar lordosis, and increased thoracic kyphosis  PALPATION: TTP R lumbar paraspinals   LUMBAR ROM: Deferred due to dizziness with movement  AROM eval  Flexion   Extension   Right lateral flexion   Left lateral flexion   Right rotation   Left rotation    (Blank rows = not tested)  LOWER EXTREMITY ROM:   WFL for gait and transfers  Active  Right eval Left eval  Hip flexion    Hip extension    Hip abduction    Hip adduction    Hip internal rotation    Hip external rotation    Knee flexion    Knee extension    Ankle dorsiflexion    Ankle plantarflexion    Ankle inversion    Ankle eversion     (Blank rows = not tested)  LOWER EXTREMITY MMT:    MMT Right eval Left eval  Hip flexion 4- 4-  Hip extension 4- 4-  Hip abduction 4- 4-  Hip adduction    Hip internal rotation    Hip external rotation    Knee flexion 4- 4-  Knee extension 4- 4-  Ankle dorsiflexion    Ankle plantarflexion 4- 4-  Ankle inversion    Ankle eversion     (Blank rows = not tested)  LUMBAR SPECIAL TESTS:  Straight leg raise test: Negative and Slump test: Negative  FUNCTIONAL TESTS:  30 seconds chair stand test 8 stands 09/02/23: 304 ft GAIT: Distance walked: 5ft x2 Assistive device utilized: None Level of assistance: Complete Independence Comments: slow cadence  TODAY'S TREATMENT:   [facility] Adult PT Treatment:                                                DATE: 09/23/23 Therapeutic Exercise: Nustep level 5 x 6 mins Standing hip abduction/extension 2x10 ea BIL Sidestepping at counter no UE support x 4 laps Standing heel toe raises at counter 2x10 Standing march x30" (seated rest break after) Omega knee extension 15# 2x10 Omega knee flexion 25# 2x10 Seated hip adduction ball squeeze 3" hold 2x10 STS low mat table no UE support - 2x5   [facility] Adult PT Treatment:                                                 DATE: 09/21/23 Therapeutic Exercise: Nustep L4 8 min Introduced seated core exercises of hip tosses, shoulder tosses, chops and Victories, 10 reps with 2000g weighted ball followed by latissimus pushdowns with inspiration on active press progressing to FAQs and marching 10/10 each task STS 1x w/o UE assist Alternating shoulder flexion and hip ext on wall 10/10   [facility] Adult PT Treatment:                                                DATE: 09/16/23 Therapeutic Exercise: Nustep level 5 x 6 mins Standing hip abduction/extension 2x10 ea BIL Sidestepping at counter no UE support x 4 laps Standing heel toe raises at  counter 2x15 Standing march 2x30" Omega knee extension 10# 2x10 Omega knee flexion 20# 2x10 Seated alternating march 2x30"  Seated hip adduction ball squeeze 3" hold 2x10 STS low mat table UE support - 2x5    PATIENT EDUCATION:  Education details: rationale for interventions, HEP  Person educated: Patient Education method: Explanation Education comprehension: verbalized understanding and needs further education  HOME EXERCISE PROGRAM: Access Code: [email or web address] URL: [email or web address] Date: 09/06/2023 Prepared by: Fransisco Hertz  Exercises - Sit to Stand with Armchair  - 2 x daily - 5 x weekly - 1 sets - 5 reps - Heel Toe Raises with Counter Support  - 2 x daily - 5 x weekly - 1 sets - 10 reps - Standing March with Counter Support  - 2 x daily - 5 x weekly - 1 sets - 8 reps  ASSESSMENT:  CLINICAL IMPRESSION:  Patient presents to PT reporting some lower back pain last night but that it is better today. Session today focused on proximal hip and LE strengthening as well as improving standing activity tolerance. Patient was able to tolerate all prescribed exercises with no adverse effects. Patient continues to benefit from skilled PT services and should be progressed as able to improve functional independence.     OBJECTIVE IMPAIRMENTS: Abnormal gait, decreased activity tolerance, decreased endurance, decreased knowledge of condition, difficulty walking, decreased strength, improper body mechanics, postural dysfunction, obesity, and pain.   ACTIVITY LIMITATIONS: carrying, lifting, bending, standing, squatting, and stairs  PERSONAL FACTORS: Age, Fitness, Time since onset of injury/illness/exacerbation, and 1 comorbidity: underlying stenosis  are also affecting patient's functional outcome.   REHAB POTENTIAL: Good  CLINICAL DECISION MAKING: Stable/uncomplicated  EVALUATION COMPLEXITY: Low   GOALS: Goals reviewed with patient? No  SHORT TERM GOALS: Target date: 09/13/2023    Patient to demonstrate independence in HEP  Baseline: [id number] Goal status: MET Pt reports adherence 09/16/23  2.  Patient to stand from chair w/o need of UE Support Baseline: Needs BUE support Goal status: Progressing  3.  Assess 2 MWT and establish goal Baseline: TBD Goal status: MET Assessed 09/02/23 304 ft    LONG TERM GOALS: Target date: 10/04/2023    Patient will score at least 52% on FOTO to signify clinically meaningful improvement in functional abilities.   Baseline: 30%; 09/21/23 82%  Goal status: Met  2.  Patient will increase 30s chair stand reps from 8 to 10 with/without arms to demonstrate and improved functional ability with less pain/difficulty as well as reduce fall risk.  Baseline: 8 Goal status: INITIAL  3.  Patient will acknowledge 4/10 pain at least once during episode of care  Baseline: 8/10 Goal status: INITIAL  4.  4/5 BLE strength throughout Baseline:  MMT Right eval Left eval  Hip flexion 4- 4-  Hip extension 4- 4-  Hip abduction 4- 4-  Hip adduction    Hip internal rotation    Hip external rotation    Knee flexion 4- 4-  Knee extension 4- 4-  Ankle dorsiflexion    Ankle plantarflexion 4- 4-   Goal status: INITIAL  5.  Assess progress on 2 MWT Baseline: TBD;  09/02/23: 304 ft Goal status: INITIAL  6.  Decrease tenderness to R lumbar paraspinals to minimal Baseline: Moderate tenderness Goal status: INITIAL  PLAN:  PT FREQUENCY: 1-2x/week  PT DURATION: 6 weeks  PLANNED INTERVENTIONS: 97164- PT Re-evaluation, 97110-Therapeutic exercises, 97530- Therapeutic activity, 97112- Neuromuscular re-education, 97535- Self Care, 85631- Manual therapy, [id number]- Gait training, 97014- Electrical stimulation (unattended),  Patient/Family education, Balance training, Stair training, Dry Needling, Joint mobilization, Spinal mobilization, Cryotherapy, and Moist heat.  PLAN FOR NEXT SESSION: HEP review and update, manual techniques as appropriate, aerobic tasks, ROM and flexibility activities, strengthening and PREs, TPDN, gait and balance training as needed. Mindful of pacemaker   Berta Minor PTA 09/23/2023 10:42 AM

## 2023-09-25 ENCOUNTER — Other Ambulatory Visit: Payer: Self-pay | Admitting: Family Medicine

## 2023-09-27 NOTE — Therapy (Signed)
OUTPATIENT PHYSICAL THERAPY TREATMENT NOTE   Patient Name: Guy Moore MRN: [id number] DOB:1934-08-25, 89 y.o., male Today's Date: 09/28/2023  END OF SESSION:  PT End of Session - 09/28/23 1130     Visit Number 9    Number of Visits 12    Date for PT Re-Evaluation 10/23/23    Authorization Type MCR    Progress Note Due on Visit 10    PT Start Time 1130    PT Stop Time 1210    PT Time Calculation (min) 40 min    Activity Tolerance Patient tolerated treatment well    Behavior During Therapy WFL for tasks assessed/performed                 Past Medical History:  Diagnosis Date   Allergic rhinitis    Arthritis    Asthma    Atrial fibrillation (HCC)    sees Dr. Bedelia Person at [facility]    Atrial flutter (HCC)    BCC (basal cell carcinoma of skin)    Nose   Chronic kidney disease    Complication of anesthesia    no problems per patient   COPD with emphysema (HCC)    sees Dr. Coralyn Helling    Diverticulosis    Dysrhythmia    afib, flutter and tachycardia   Erosive esophagitis    Esophageal stricture    Gastric ulcer    Hearing loss    uses amplification   Hemorrhoids    Hiatal hernia    History of kidney stones    HTN (hypertension)    Hyperlipidemia    Knee problem    2% permanent partial impairment Right   Macular degeneration    Perineal abscess    Pneumonia    Pre-diabetes    Presence of permanent cardiac pacemaker    Sleep apnea    cpap   Past Surgical History:  Procedure Laterality Date   CARDIAC ELECTROPHYSIOLOGY STUDY AND ABLATION     x 4 (sees [facility]. Elmer Picker at [facility])   CO2 LASER APPLICATION N/A 03/26/2023   Procedure: CO2 LASER ABLATION OF PENIS;  Surgeon: Jannifer Hick, MD;  Location: [facility];  Service: Urology;  Laterality: N/A;   CYST EXCISION PERINEAL     CYSTOSCOPY WITH URETEROSCOPY AND STENT PLACEMENT Right 10/29/2022   Procedure: CYSTOSCOPY WITH RETROGRADE WITH URETHERAL DILITATIONRIGHT DIAGNOSTIC   URETEROSCOPY AND STENT PLACEMENT;  Surgeon: Jannifer Hick, MD;  Location: [facility];  Service: Urology;  Laterality: Right;   CYSTOSCOPY/URETEROSCOPY/HOLMIUM LASER/STENT PLACEMENT Right 11/27/2022   Procedure: CYSTOSCOPY/ RIGHT RETROGRADE PYELOGRAM/RIGHT HOLMIUM LASER/RIGHT STENT PLACEMENT/BASKET;  Surgeon: Jannifer Hick, MD;  Location: [facility];  Service: Urology;  Laterality: Right;  60 MINUTES NEEDED FOR CASE   INSERT / REPLACE / REMOVE PACEMAKER     NOSE SURGERY     septoplasty   PENILE BIOPSY N/A 11/27/2022   Procedure: PENILE BIOPSY;  Surgeon: Jannifer Hick, MD;  Location: [facility];  Service: Urology;  Laterality: N/A;   TRANSURETHRAL RESECTION OF PROSTATE N/A 03/26/2023   Procedure: BIPOLAR TRANSURETHRAL RESECTION OF THE PROSTATE (TURP);  Surgeon: Jannifer Hick, MD;  Location: [facility];  Service: Urology;  Laterality: N/A;  90 MINUTES NEEDED FOR CASE   UVULOPALATOPHARYNGOPLASTY  06/13/1979   VASECTOMY  06/12/1969   w/complications   Patient Active Problem List   Diagnosis Date Noted   BPH (benign prostatic hyperplasia) 03/26/2023   Urinary retention 11/04/2022   Ureteral calculus, right 11/04/2022  History of UTI 11/04/2022   Hydronephrosis 11/04/2022   Perineal abscess 08/04/2021   CAP (community acquired pneumonia) 05/25/2021   Dyspnea 05/25/2021   Bicytopenia 08/06/2020   B12 deficiency 05/06/2020   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   Asthma 08/26/2016   Hyperglycemia 05/29/2016   Thoracic scoliosis 12/16/2015   Thrombocytopenia (HCC) 04/12/2015   History of basal cell cancer 06/27/2014   GERD (gastroesophageal reflux disease) 08/31/2011   Anticoagulant long-term use 08/31/2011   OBSTRUCTIVE SLEEP APNEA 07/04/2009   Atrial fibrillation (HCC) 04/19/2009   Allergic rhinitis 04/14/2007   Hyperlipidemia 04/13/2007   Essential hypertension 04/13/2007    PCP: Nelwyn Salisbury, MD  REFERRING PROVIDER: Nelwyn Salisbury, MD  REFERRING DIAG: [phone] (ICD-10-CM) - Spinal stenosis of  lumbar region without neurogenic claudication  Rationale for Evaluation and Treatment: Rehabilitation  THERAPY DIAG:  Muscle weakness (generalized)  Spinal stenosis of lumbar region, unspecified whether neurogenic claudication present  Other low back pain  ONSET DATE: 6-8 weeks  SUBJECTIVE:                                                                                                                                                                                           SUBJECTIVE STATEMENT: Experienced some exacerbation of symptoms from prolonged sitting in low chair.  Resolved within 24 hours after Tylenol and rest.  Feels ready to DC next session with HEP focused on core work.  PERTINENT HISTORY:  Here for several weeks of right sided low back pain that radiates down the right buttock and thigh. No weakness or numbness. He had an MRI in 2017 that revealed a moderate to severe stenosis at L4-5. He takes Tylenol as needed.   PAIN:   NPS: "almost zero" / 10 Pain location: R low back Pain description: ache Aggravating factors: lifting, prolonged positioning Relieving factors: position changes, lying supine  PRECAUTIONS: Pacemaker present  RED FLAGS: None   WEIGHT BEARING RESTRICTIONS: No  FALLS:  Has patient fallen in last 6 months? No  OCCUPATION: retired  PLOF: Independent  PATIENT GOALS: To manage my back pain  NEXT MD VISIT: PRN  OBJECTIVE:  Note: Objective measures were completed at Evaluation unless otherwise noted.  DIAGNOSTIC FINDINGS:  IMPRESSION: 1. Nondisplaced posterior left eighth rib fracture. 2. Otherwise no acute osseous abnormality in the thoracolumbar spine or visible sacrum. 3. Overall mild for age thoracic and lumbar spine degeneration. No thoracic spinal stenosis. There is moderate to severe degenerative lumbar spinal stenosis at L4-L5, primarily due to severe posterior element degeneration.     Electronically Signed   By: Odessa Fleming  M.D.   On: 02/02/2016  10:39  PATIENT SURVEYS:  FOTO 30(52 predicted)  09/21/23 82%  MUSCLE LENGTH: Hamstrings: Right 70 deg; Left 70 deg  POSTURE: rounded shoulders, decreased lumbar lordosis, and increased thoracic kyphosis  PALPATION: TTP R lumbar paraspinals   LUMBAR ROM: Deferred due to dizziness with movement  AROM eval  Flexion   Extension   Right lateral flexion   Left lateral flexion   Right rotation   Left rotation    (Blank rows = not tested)  LOWER EXTREMITY ROM:   WFL for gait and transfers  Active  Right eval Left eval  Hip flexion    Hip extension    Hip abduction    Hip adduction    Hip internal rotation    Hip external rotation    Knee flexion    Knee extension    Ankle dorsiflexion    Ankle plantarflexion    Ankle inversion    Ankle eversion     (Blank rows = not tested)  LOWER EXTREMITY MMT:    MMT Right eval Left eval B 09/28/23  Hip flexion 4- 4- 4  Hip extension 4- 4- 4  Hip abduction 4- 4- 4  Hip adduction     Hip internal rotation     Hip external rotation     Knee flexion 4- 4- 4  Knee extension 4- 4- 4  Ankle dorsiflexion     Ankle plantarflexion 4- 4- 4  Ankle inversion     Ankle eversion      (Blank rows = not tested)  LUMBAR SPECIAL TESTS:  Straight leg raise test: Negative and Slump test: Negative  FUNCTIONAL TESTS:  30 seconds chair stand test 8 stands 09/02/23: 304 ft GAIT: Distance walked: 67ft x2 Assistive device utilized: None Level of assistance: Complete Independence Comments: slow cadence  TODAY'S TREATMENT:   [facility] Adult PT Treatment:                                                DATE: 09/28/23 Therapeutic Exercise: Nustep L4 8 min Introduced seated core exercises of hip tosses, shoulder tosses, chops and Victories, 10 reps with 3000g weighted ball followed by latissimus pushdowns with inspiration on active press progressing to FAQs and marching 15/15 each task STS 1x w/o UE assist Alternating  shoulder flexion and hip ext on wall 10/10  [facility] Adult PT Treatment:                                                DATE: 09/23/23 Therapeutic Exercise: Nustep level 5 x 6 mins Standing hip abduction/extension 2x10 ea BIL Sidestepping at counter no UE support x 4 laps Standing heel toe raises at counter 2x10 Standing march x30" (seated rest break after) Omega knee extension 15# 2x10 Omega knee flexion 25# 2x10 Seated hip adduction ball squeeze 3" hold 2x10 STS low mat table no UE support - 2x5   [facility] Adult PT Treatment:                                                DATE: 09/21/23 Therapeutic Exercise: Lora Paula  L4 8 min Introduced seated core exercises of hip tosses, shoulder tosses, chops and Victories, 10 reps with 2000g weighted ball followed by latissimus pushdowns with inspiration on active press progressing to FAQs and marching 10/10 each task STS 1x w/o UE assist Alternating shoulder flexion and hip ext on wall 10/10   [facility] Adult PT Treatment:                                                DATE: 09/16/23 Therapeutic Exercise: Nustep level 5 x 6 mins Standing hip abduction/extension 2x10 ea BIL Sidestepping at counter no UE support x 4 laps Standing heel toe raises at counter 2x15 Standing march 2x30" Omega knee extension 10# 2x10 Omega knee flexion 20# 2x10 Seated alternating march 2x30"  Seated hip adduction ball squeeze 3" hold 2x10 STS low mat table UE support - 2x5    PATIENT EDUCATION:  Education details: rationale for interventions, HEP  Person educated: Patient Education method: Explanation Education comprehension: verbalized understanding and needs further education  HOME EXERCISE PROGRAM: Access Code: [email or web address] URL: [email or web address] Date: 09/06/2023 Prepared by: Fransisco Hertz  Exercises - Sit to Stand with Armchair  - 2 x daily - 5 x weekly - 1 sets - 5 reps - Heel Toe Raises with Counter Support  - 2 x daily - 5 x weekly - 1 sets -  10 reps - Standing March with Counter Support  - 2 x daily - 5 x weekly - 1 sets - 8 reps  ASSESSMENT:  CLINICAL IMPRESSION: Felt core tasks from previous session was helpful.  Advanced weight and resistance on tasks as noted.  Patient able to complete all tasks w/o symptom flare-up.  Rest periods give accordingly.   OBJECTIVE IMPAIRMENTS: Abnormal gait, decreased activity tolerance, decreased endurance, decreased knowledge of condition, difficulty walking, decreased strength, improper body mechanics, postural dysfunction, obesity, and pain.   ACTIVITY LIMITATIONS: carrying, lifting, bending, standing, squatting, and stairs  PERSONAL FACTORS: Age, Fitness, Time since onset of injury/illness/exacerbation, and 1 comorbidity: underlying stenosis  are also affecting patient's functional outcome.   REHAB POTENTIAL: Good  CLINICAL DECISION MAKING: Stable/uncomplicated  EVALUATION COMPLEXITY: Low   GOALS: Goals reviewed with patient? No  SHORT TERM GOALS: Target date: 09/13/2023    Patient to demonstrate independence in HEP  Baseline: [id number] Goal status: MET Pt reports adherence 09/16/23  2.  Patient to stand from chair w/o need of UE Support Baseline: Needs BUE support; 09/28/23 Able to stand w/o UE assist Goal status: Met  3.  Assess 2 MWT and establish goal Baseline: TBD Goal status: MET Assessed 09/02/23 304 ft    LONG TERM GOALS: Target date: 10/04/2023    Patient will score at least 52% on FOTO to signify clinically meaningful improvement in functional abilities.   Baseline: 30%; 09/21/23 82%  Goal status: Met  2.  Patient will increase 30s chair stand reps from 8 to 10 with/without arms to demonstrate and improved functional ability with less pain/difficulty as well as reduce fall risk.  Baseline: 8 Goal status: INITIAL  3.  Patient will acknowledge 4/10 pain at least once during episode of care  Baseline: 8/10; 09/28/23 Pain minimal to none Goal status:  Met  4.  4/5 BLE strength throughout Baseline:  MMT Right eval Left eval B 09/28/23  Hip flexion 4- 4- 4  Hip extension 4- 4- 4  Hip abduction 4- 4- 4  Hip adduction     Hip internal rotation     Hip external rotation     Knee flexion 4- 4- 4  Knee extension 4- 4- 4  Ankle dorsiflexion     Ankle plantarflexion 4- 4- 4   Goal status: Met  5.  Assess progress on 2 MWT Baseline: TBD; 09/02/23: 304 ft Goal status: INITIAL  6.  Decrease tenderness to R lumbar paraspinals to minimal Baseline: Moderate tenderness Goal status: INITIAL  PLAN:  PT FREQUENCY: 1-2x/week  PT DURATION: 6 weeks  PLANNED INTERVENTIONS: 97164- PT Re-evaluation, 97110-Therapeutic exercises, 97530- Therapeutic activity, 97112- Neuromuscular re-education, 97535- Self Care, 95621- Manual therapy, [phone]- Gait training, [phone]- Electrical stimulation (unattended), Patient/Family education, Balance training, Stair training, Dry Needling, Joint mobilization, Spinal mobilization, Cryotherapy, and Moist heat.  PLAN FOR NEXT SESSION: HEP review and update, manual techniques as appropriate, aerobic tasks, ROM and flexibility activities, strengthening and PREs, TPDN, gait and balance training as needed. Mindful of pacemaker   Hildred Laser PT 09/28/2023 12:12 PM

## 2023-09-28 ENCOUNTER — Ambulatory Visit: Payer: TRICARE For Life (TFL)

## 2023-09-28 DIAGNOSIS — M5459 Other low back pain: Secondary | ICD-10-CM | POA: Diagnosis not present

## 2023-09-28 DIAGNOSIS — M48061 Spinal stenosis, lumbar region without neurogenic claudication: Secondary | ICD-10-CM | POA: Diagnosis not present

## 2023-09-28 DIAGNOSIS — M6281 Muscle weakness (generalized): Secondary | ICD-10-CM

## 2023-09-30 ENCOUNTER — Ambulatory Visit: Payer: Medicare Other

## 2023-09-30 DIAGNOSIS — M5459 Other low back pain: Secondary | ICD-10-CM | POA: Diagnosis not present

## 2023-09-30 DIAGNOSIS — M6281 Muscle weakness (generalized): Secondary | ICD-10-CM

## 2023-09-30 DIAGNOSIS — M48061 Spinal stenosis, lumbar region without neurogenic claudication: Secondary | ICD-10-CM

## 2023-09-30 NOTE — Therapy (Addendum)
OUTPATIENT PHYSICAL THERAPY TREATMENT NOTE/DC SUMMARY   Patient Name: Guy Moore MRN: [id number] DOB:[birth year], 89 y.o., male Today's Date: 09/30/2023 PHYSICAL THERAPY DISCHARGE SUMMARY  Visits from Start of Care: 10  Current functional level related to goals / functional outcomes: Goals met   Remaining deficits: none   Education / Equipment: HEP   Patient agrees to discharge. Patient goals were met. Patient is being discharged due to being pleased with the current functional level.  END OF SESSION:  PT End of Session - 09/30/23 1000     Visit Number 10    Number of Visits 12    Date for PT Re-Evaluation 10/23/23    Authorization Type MCR    Progress Note Due on Visit 10    PT Start Time 1000    PT Stop Time 1032    PT Time Calculation (min) 32 min    Activity Tolerance Patient tolerated treatment well    Behavior During Therapy WFL for tasks assessed/performed             Past Medical History:  Diagnosis Date   Allergic rhinitis    Arthritis    Asthma    Atrial fibrillation (HCC)    sees Dr. Bedelia [facility] at [facility]    Atrial flutter (HCC)    BCC (basal cell carcinoma of skin)    Nose   Chronic kidney disease    Complication of anesthesia    no problems per patient   COPD with emphysema (HCC)    sees Dr. Coralyn Helling    Diverticulosis    Dysrhythmia    afib, flutter and tachycardia   Erosive esophagitis    Esophageal stricture    Gastric ulcer    Hearing loss    uses amplification   Hemorrhoids    Hiatal hernia    History of kidney stones    HTN (hypertension)    Hyperlipidemia    Knee problem    2% permanent partial impairment Right   Macular degeneration    Perineal abscess    Pneumonia    Pre-diabetes    Presence of permanent cardiac pacemaker    Sleep apnea    cpap   Past Surgical History:  Procedure Laterality Date   CARDIAC ELECTROPHYSIOLOGY STUDY AND ABLATION     x 4 (sees Dr. Elmer Picker at [facility])   CO2 LASER APPLICATION N/A 03/26/2023   Procedure: CO2 LASER ABLATION OF PENIS;  Surgeon: Jannifer Hick, MD;  Location: [facility];  Service: Urology;  Laterality: N/A;   CYST EXCISION PERINEAL     CYSTOSCOPY WITH URETEROSCOPY AND STENT PLACEMENT Right 10/29/2022   Procedure: CYSTOSCOPY WITH RETROGRADE WITH URETHERAL DILITATIONRIGHT DIAGNOSTIC  URETEROSCOPY AND STENT PLACEMENT;  Surgeon: Jannifer Hick, MD;  Location: [facility];  Service: Urology;  Laterality: Right;   CYSTOSCOPY/URETEROSCOPY/HOLMIUM LASER/STENT PLACEMENT Right 11/27/2022   Procedure: CYSTOSCOPY/ RIGHT RETROGRADE PYELOGRAM/RIGHT HOLMIUM LASER/RIGHT STENT PLACEMENT/BASKET;  Surgeon: Jannifer Hick, MD;  Location: [facility];  Service: Urology;  Laterality: Right;  60 MINUTES NEEDED FOR CASE   INSERT / REPLACE / REMOVE PACEMAKER     NOSE SURGERY     septoplasty   PENILE BIOPSY N/A 11/27/2022   Procedure: PENILE BIOPSY;  Surgeon: Jannifer Hick, MD;  Location: [facility];  Service: Urology;  Laterality: N/A;   TRANSURETHRAL RESECTION OF PROSTATE N/A 03/26/2023   Procedure: BIPOLAR TRANSURETHRAL RESECTION OF THE PROSTATE (TURP);  Surgeon: Jannifer Hick, MD;  Location: [facility];  Service: Urology;  Laterality: N/A;  90 MINUTES NEEDED FOR CASE   UVULOPALATOPHARYNGOPLASTY  06/13/1979   VASECTOMY  06/12/1969   w/complications   Patient Active Problem List   Diagnosis Date Noted   BPH (benign prostatic hyperplasia) 03/26/2023   Urinary retention 11/04/2022   Ureteral calculus, right 11/04/2022   History of UTI 11/04/2022   Hydronephrosis 11/04/2022   Perineal abscess 08/04/2021   CAP (community acquired pneumonia) 05/25/2021   Dyspnea 05/25/2021   Bicytopenia 08/06/2020   B12 deficiency 05/06/2020   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   Asthma 08/26/2016   Hyperglycemia 05/29/2016   Thoracic scoliosis 12/16/2015   Thrombocytopenia (HCC) 04/12/2015   History of basal cell cancer 06/27/2014   GERD (gastroesophageal reflux disease)  08/31/2011   Anticoagulant long-term use 08/31/2011   OBSTRUCTIVE SLEEP APNEA 07/04/2009   Atrial fibrillation (HCC) 04/19/2009   Allergic rhinitis 04/14/2007   Hyperlipidemia 04/13/2007   Essential hypertension 04/13/2007    PCP: Nelwyn Salisbury, MD  REFERRING PROVIDER: Nelwyn Salisbury, MD  REFERRING DIAG: [phone] (ICD-10-CM) - Spinal stenosis of lumbar region without neurogenic claudication  Rationale for Evaluation and Treatment: Rehabilitation  THERAPY DIAG:  Muscle weakness (generalized)  Spinal stenosis of lumbar region, unspecified whether neurogenic claudication present  Other low back pain  ONSET DATE: 6-8 weeks  SUBJECTIVE:                                                                                                                                                                                           SUBJECTIVE STATEMENT: Patient reports some stiffness in his lower back from driving yesterday.  PERTINENT HISTORY:  Here for several weeks of right sided low back pain that radiates down the right buttock and thigh. No weakness or numbness. He had an MRI in 2017 that revealed a moderate to severe stenosis at L4-5. He takes Tylenol as needed.   PAIN:   NPS: "almost zero" / 10 Pain location: R low back Pain description: ache Aggravating factors: lifting, prolonged positioning Relieving factors: position changes, lying supine  PRECAUTIONS: Pacemaker present  RED FLAGS: None   WEIGHT BEARING RESTRICTIONS: No  FALLS:  Has patient fallen in last 6 months? No  OCCUPATION: retired  PLOF: Independent  PATIENT GOALS: To manage my back pain  NEXT MD VISIT: PRN  OBJECTIVE:  Note: Objective measures were completed at Evaluation unless otherwise noted.  DIAGNOSTIC FINDINGS:  IMPRESSION: 1. Nondisplaced posterior left eighth rib fracture. 2. Otherwise no acute osseous abnormality in the thoracolumbar spine or visible sacrum. 3. Overall mild for age  thoracic and lumbar spine degeneration. No thoracic spinal stenosis. There is  moderate to severe degenerative lumbar spinal stenosis at L4-L5, primarily due to severe posterior element degeneration.     Electronically Signed   By: Odessa Fleming M.D.   On: 02/02/2016 10:39  PATIENT SURVEYS:  FOTO 30(52 predicted)  09/21/23 82%  MUSCLE LENGTH: Hamstrings: Right 70 deg; Left 70 deg  POSTURE: rounded shoulders, decreased lumbar lordosis, and increased thoracic kyphosis  PALPATION: TTP R lumbar paraspinals   LUMBAR ROM: Deferred due to dizziness with movement  AROM eval  Flexion   Extension   Right lateral flexion   Left lateral flexion   Right rotation   Left rotation    (Blank rows = not tested)  LOWER EXTREMITY ROM:   WFL for gait and transfers  Active  Right eval Left eval  Hip flexion    Hip extension    Hip abduction    Hip adduction    Hip internal rotation    Hip external rotation    Knee flexion    Knee extension    Ankle dorsiflexion    Ankle plantarflexion    Ankle inversion    Ankle eversion     (Blank rows = not tested)  LOWER EXTREMITY MMT:    MMT Right eval Left eval B 09/28/23  Hip flexion 4- 4- 4  Hip extension 4- 4- 4  Hip abduction 4- 4- 4  Hip adduction     Hip internal rotation     Hip external rotation     Knee flexion 4- 4- 4  Knee extension 4- 4- 4  Ankle dorsiflexion     Ankle plantarflexion 4- 4- 4  Ankle inversion     Ankle eversion      (Blank rows = not tested)  LUMBAR SPECIAL TESTS:  Straight leg raise test: Negative and Slump test: Negative  FUNCTIONAL TESTS:  30 seconds chair stand test 8 stands 09/02/23: 304 ft 09/30/23: 10 reps no UE support : 345 ft GAIT: Distance walked: 71ft x2 Assistive device utilized: None Level of assistance: Complete Independence Comments: slow cadence  TODAY'S TREATMENT:   [facility] Adult PT Treatment:                                                DATE: 09/30/23 Therapeutic  Exercise: Nustep L4 8 min Therapeutic Activity: 345 ft 30s STS 10 reps no UE Discussion of goals Review and update of HEP   [facility] Adult PT Treatment:                                                DATE: 09/28/23 Therapeutic Exercise: Nustep L4 8 min Introduced seated core exercises of hip tosses, shoulder tosses, chops and Victories, 10 reps with 3000g weighted ball followed by latissimus pushdowns with inspiration on active press progressing to FAQs and marching 15/15 each task STS 1x w/o UE assist Alternating shoulder flexion and hip ext on wall 10/10  [facility] Adult PT Treatment:                                                DATE: 09/23/23  Therapeutic Exercise: Nustep level 5 x 6 mins Standing hip abduction/extension 2x10 ea BIL Sidestepping at counter no UE support x 4 laps Standing heel toe raises at counter 2x10 Standing march x30" (seated rest break after) Omega knee extension 15# 2x10 Omega knee flexion 25# 2x10 Seated hip adduction ball squeeze 3" hold 2x10 STS low mat table no UE support - 2x5    PATIENT EDUCATION:  Education details: rationale for interventions, HEP  Person educated: Patient Education method: Explanation Education comprehension: verbalized understanding and needs further education  HOME EXERCISE PROGRAM: Access Code: [email or web address] URL: [email or web address] Date: 09/30/2023 Prepared by: Berta Minor  Exercises - Sit to Stand with Armchair  - 2 x daily - 5 x weekly - 1 sets - 5 reps - Heel Toe Raises with Counter Support  - 2 x daily - 5 x weekly - 1 sets - 10 reps - Standing March with Counter Support  - 2 x daily - 5 x weekly - 1 sets - 8 reps - Weighted Ab Twist (sitting in chair)  - 2 x daily - 5 x weekly - 2 sets - 10 reps - Bird Dog on Counter (or at wall)  - 1 x daily - 5 x weekly - 2 sets - 10 reps - Seated Hip Adduction Isometrics with Ball  - 2 x daily - 5 x weekly - 2 sets - 10 reps - 5sec  hold  ASSESSMENT:  CLINICAL IMPRESSION:  Patient presents to PT reporting some stiffness in his lower back today that he attributes to doing increased driving yesterday. Re-administered and 30 second STS this session with patient exceeding goals. He reports significant improvement in pain and function since beginning PT. Updated and reviewed HEP. All of patients questions answered and he is appropriate for DC from PT at this time.   OBJECTIVE IMPAIRMENTS: Abnormal gait, decreased activity tolerance, decreased endurance, decreased knowledge of condition, difficulty walking, decreased strength, improper body mechanics, postural dysfunction, obesity, and pain.   ACTIVITY LIMITATIONS: carrying, lifting, bending, standing, squatting, and stairs  PERSONAL FACTORS: Age, Fitness, Time since onset of injury/illness/exacerbation, and 1 comorbidity: underlying stenosis  are also affecting patient's functional outcome.   REHAB POTENTIAL: Good  CLINICAL DECISION MAKING: Stable/uncomplicated  EVALUATION COMPLEXITY: Low   GOALS: Goals reviewed with patient? No  SHORT TERM GOALS: Target date: 09/13/2023    Patient to demonstrate independence in HEP  Baseline: [id number] Goal status: MET Pt reports adherence 09/16/23  2.  Patient to stand from chair w/o need of UE Support Baseline: Needs BUE support; 09/28/23 Able to stand w/o UE assist Goal status: Met  3.  Assess 2 MWT and establish goal Baseline: TBD Goal status: MET Assessed 09/02/23 304 ft    LONG TERM GOALS: Target date: 10/04/2023    Patient will score at least 52% on FOTO to signify clinically meaningful improvement in functional abilities.   Baseline: 30%; 09/21/23 82%  Goal status: Met  2.  Patient will increase 30s chair stand reps from 8 to 10 with/without arms to demonstrate and improved functional ability with less pain/difficulty as well as reduce fall risk.  Baseline: 8 Goal status: MET 09/30/23: 10 reps no  UE  3.  Patient will acknowledge 4/10 pain at least once during episode of care  Baseline: 8/10; 09/28/23 Pain minimal to none Goal status: Met  4.  4/5 BLE strength throughout Baseline:  MMT Right eval Left eval B 09/28/23  Hip flexion 4- 4- 4  Hip extension 4- 4- 4  Hip abduction 4- 4- 4  Hip adduction     Hip internal rotation     Hip external rotation     Knee flexion 4- 4- 4  Knee extension 4- 4- 4  Ankle dorsiflexion     Ankle plantarflexion 4- 4- 4   Goal status: Met  5.  Assess progress on 2 MWT Baseline: TBD; 09/02/23: 304 ft Goal status: MET 09/30/23: 345 Ft  6.  Decrease tenderness to R lumbar paraspinals to minimal Baseline: Moderate tenderness Goal status: MET  PLAN:  PT FREQUENCY: 1-2x/week  PT DURATION: 6 weeks  PLANNED INTERVENTIONS: 97164- PT Re-evaluation, 97110-Therapeutic exercises, 97530- Therapeutic activity, 97112- Neuromuscular re-education, 97535- Self Care, 16109- Manual therapy, [phone]- Gait training, [phone]- Electrical stimulation (unattended), Patient/Family education, Balance training, Stair training, Dry Needling, Joint mobilization, Spinal mobilization, Cryotherapy, and Moist heat.  PLAN FOR NEXT SESSION: DC   Berta Minor PTA 09/30/2023 10:38 AM

## 2023-10-04 ENCOUNTER — Encounter (INDEPENDENT_AMBULATORY_CARE_PROVIDER_SITE_OTHER): Payer: Medicare Other | Admitting: Ophthalmology

## 2023-10-04 ENCOUNTER — Ambulatory Visit: Payer: Medicare Other

## 2023-10-04 DIAGNOSIS — H353231 Exudative age-related macular degeneration, bilateral, with active choroidal neovascularization: Secondary | ICD-10-CM | POA: Diagnosis not present

## 2023-10-04 DIAGNOSIS — H35033 Hypertensive retinopathy, bilateral: Secondary | ICD-10-CM | POA: Diagnosis not present

## 2023-10-04 DIAGNOSIS — D3131 Benign neoplasm of right choroid: Secondary | ICD-10-CM

## 2023-10-04 DIAGNOSIS — I10 Essential (primary) hypertension: Secondary | ICD-10-CM

## 2023-10-04 DIAGNOSIS — H43813 Vitreous degeneration, bilateral: Secondary | ICD-10-CM

## 2023-10-08 ENCOUNTER — Ambulatory Visit (INDEPENDENT_AMBULATORY_CARE_PROVIDER_SITE_OTHER): Payer: Medicare Other

## 2023-10-08 DIAGNOSIS — E538 Deficiency of other specified B group vitamins: Secondary | ICD-10-CM

## 2023-10-08 MED ORDER — CYANOCOBALAMIN 1000 MCG/ML IJ SOLN
1000.0000 ug | Freq: Once | INTRAMUSCULAR | Status: AC
  Administered 2023-10-08: 1000 ug via INTRAMUSCULAR

## 2023-10-08 NOTE — Progress Notes (Signed)
Per orders of Dr. Clent Ridges, injection of B12 given by Vickii Chafe on Left Deltoid. Patient tolerated injection well.

## 2023-10-11 DIAGNOSIS — H903 Sensorineural hearing loss, bilateral: Secondary | ICD-10-CM | POA: Diagnosis not present

## 2023-10-11 DIAGNOSIS — H6123 Impacted cerumen, bilateral: Secondary | ICD-10-CM | POA: Diagnosis not present

## 2023-10-12 ENCOUNTER — Ambulatory Visit (INDEPENDENT_AMBULATORY_CARE_PROVIDER_SITE_OTHER): Payer: Medicare Other | Admitting: Family Medicine

## 2023-10-12 ENCOUNTER — Encounter: Payer: Self-pay | Admitting: Family Medicine

## 2023-10-12 VITALS — BP 114/60 | HR 71 | Temp 98.1°F | Wt 230.0 lb

## 2023-10-12 DIAGNOSIS — S60512A Abrasion of left hand, initial encounter: Secondary | ICD-10-CM | POA: Diagnosis not present

## 2023-10-12 NOTE — Progress Notes (Signed)
   Subjective:    Patient ID: Guy Moore, male    DOB: [birth year], 89 y.o.   MRN: [id number]  HPI Here to check his left hand. He had apparently bruised his left hand a week, though he does not remember how, and he had a small skin break. He dressed this with a Bandaid. Then several days ago as he tried to pull off the Bandaid, another piece of skin tore off. The next day he was in our clinic for a b12 shot, and he showed this to the nurse. She dressed it with Telfa and gauze. Today he says it looks better and there is no pain.    Review of Systems  Constitutional: Negative.   Respiratory: Negative.    Cardiovascular: Negative.   Skin:  Positive for wound.       Objective:   Physical Exam Constitutional:      Appearance: Normal appearance.  Cardiovascular:     Rate and Rhythm: Normal rate and regular rhythm.     Pulses: Normal pulses.     Heart sounds: Normal heart sounds.  Pulmonary:     Effort: Pulmonary effort is normal.     Breath sounds: Normal breath sounds.  Skin:    Comments: There is a small abrasion on the dorsum of the left hand. This is closed with a scab. No signs of infection   Neurological:     Mental Status: He is alert.           Assessment & Plan:  He has an abrasion on the left hand that is healing as expected. I advised him in the future to never apply sticky dressings to his skin directly. Instead apply an non-stick dressing and wrap it with gauze. Recheck as needed.  Garnette Olmsted, MD

## 2023-10-26 DIAGNOSIS — N200 Calculus of kidney: Secondary | ICD-10-CM | POA: Diagnosis not present

## 2023-10-26 DIAGNOSIS — R350 Frequency of micturition: Secondary | ICD-10-CM | POA: Diagnosis not present

## 2023-10-26 DIAGNOSIS — D074 Carcinoma in situ of penis: Secondary | ICD-10-CM | POA: Diagnosis not present

## 2023-11-08 ENCOUNTER — Encounter (INDEPENDENT_AMBULATORY_CARE_PROVIDER_SITE_OTHER): Payer: Medicare Other | Admitting: Ophthalmology

## 2023-11-08 DIAGNOSIS — D3131 Benign neoplasm of right choroid: Secondary | ICD-10-CM

## 2023-11-08 DIAGNOSIS — H43813 Vitreous degeneration, bilateral: Secondary | ICD-10-CM

## 2023-11-08 DIAGNOSIS — I10 Essential (primary) hypertension: Secondary | ICD-10-CM

## 2023-11-08 DIAGNOSIS — H35033 Hypertensive retinopathy, bilateral: Secondary | ICD-10-CM | POA: Diagnosis not present

## 2023-11-08 DIAGNOSIS — H353231 Exudative age-related macular degeneration, bilateral, with active choroidal neovascularization: Secondary | ICD-10-CM | POA: Diagnosis not present

## 2023-11-11 ENCOUNTER — Ambulatory Visit: Payer: Medicare Other | Admitting: Family Medicine

## 2023-11-11 ENCOUNTER — Encounter: Payer: Self-pay | Admitting: Family Medicine

## 2023-11-11 VITALS — BP 110/62 | HR 86 | Temp 98.2°F | Wt 232.0 lb

## 2023-11-11 DIAGNOSIS — J209 Acute bronchitis, unspecified: Secondary | ICD-10-CM

## 2023-11-11 DIAGNOSIS — J4489 Other specified chronic obstructive pulmonary disease: Secondary | ICD-10-CM

## 2023-11-11 DIAGNOSIS — J440 Chronic obstructive pulmonary disease with acute lower respiratory infection: Secondary | ICD-10-CM | POA: Diagnosis not present

## 2023-11-11 DIAGNOSIS — E538 Deficiency of other specified B group vitamins: Secondary | ICD-10-CM

## 2023-11-11 DIAGNOSIS — R0989 Other specified symptoms and signs involving the circulatory and respiratory systems: Secondary | ICD-10-CM

## 2023-11-11 LAB — POCT INFLUENZA A/B
Influenza A, POC: NEGATIVE
Influenza B, POC: NEGATIVE

## 2023-11-11 LAB — POC COVID19 BINAXNOW: SARS Coronavirus 2 Ag: NEGATIVE

## 2023-11-11 MED ORDER — IPRATROPIUM-ALBUTEROL 0.5-2.5 (3) MG/3ML IN SOLN
3.0000 mL | Freq: Once | RESPIRATORY_TRACT | Status: AC
  Administered 2023-11-11: 3 mL via RESPIRATORY_TRACT

## 2023-11-11 MED ORDER — ALBUTEROL SULFATE (2.5 MG/3ML) 0.083% IN NEBU: 2.5000 mg | INHALATION_SOLUTION | RESPIRATORY_TRACT | 5 refills | Status: AC | PRN

## 2023-11-11 MED ORDER — DOXYCYCLINE HYCLATE 100 MG PO TABS: 100.0000 mg | ORAL_TABLET | Freq: Two times a day (BID) | ORAL | 0 refills | Status: DC

## 2023-11-11 MED ORDER — ALBUTEROL SULFATE HFA 108 (90 BASE) MCG/ACT IN AERS: 2.0000 | INHALATION_SPRAY | RESPIRATORY_TRACT | 11 refills | Status: AC | PRN

## 2023-11-11 MED ORDER — CYANOCOBALAMIN 1000 MCG/ML IJ SOLN
1000.0000 ug | Freq: Once | INTRAMUSCULAR | Status: AC
  Administered 2023-11-11: 1000 ug via INTRAMUSCULAR

## 2023-11-11 NOTE — Progress Notes (Signed)
   Subjective:    Patient ID: Guy Moore, male    DOB: 06-17-1934, 89 y.o.   MRN: [id number]  HPI Here for 5 days of chest congestion, wheezing, and a dry cough. No fever or chest pain. He has been SOB and has been using his albuterol inhaler.    Review of Systems  Constitutional: Negative.   HENT: Negative.    Eyes: Negative.   Respiratory:  Positive for cough, chest tightness, shortness of breath and wheezing.   Cardiovascular: Negative.        Objective:   Physical Exam Constitutional:      Appearance: He is not ill-appearing.     Comments: Coughing occasionally   HENT:     Right Ear: Tympanic membrane, ear canal and external ear normal.     Left Ear: Tympanic membrane, ear canal and external ear normal.     Nose: Nose normal.     Mouth/Throat:     Pharynx: Oropharynx is clear.  Eyes:     Conjunctiva/sclera: Conjunctivae normal.  Cardiovascular:     Rate and Rhythm: Normal rate. Rhythm irregular.     Pulses: Normal pulses.     Heart sounds: Normal heart sounds.  Pulmonary:     Effort: Pulmonary effort is normal.     Breath sounds: Wheezing and rhonchi present. No rales.     Comments: He was treated with a nebulizer using Duonebs, and on re-exam the rhonchi were gone and I only heard soft wheezing  Neurological:     Mental Status: He is alert.           Assessment & Plan:  Bronchitis, treat with 10 days of Doxycycline. I refilled his Albuterol inhaler and vials for his home nebulizer. Recheck as needed. We spent a total of (35   ) minutes reviewing records and discussing these issues.  Gershon Crane, MD

## 2023-11-12 ENCOUNTER — Ambulatory Visit: Payer: Medicare Other

## 2023-11-15 ENCOUNTER — Ambulatory Visit: Payer: Medicare Other

## 2023-11-15 DIAGNOSIS — I4819 Other persistent atrial fibrillation: Secondary | ICD-10-CM

## 2023-11-15 LAB — CUP PACEART REMOTE DEVICE CHECK
Battery Remaining Longevity: 85 mo
Battery Voltage: 2.98 V
Brady Statistic AP VP Percent: 0 %
Brady Statistic AP VS Percent: 0 %
Brady Statistic AS VP Percent: 85.34 %
Brady Statistic AS VS Percent: 14.66 %
Brady Statistic RA Percent Paced: 0 %
Brady Statistic RV Percent Paced: 85.34 %
Date Time Interrogation Session: 20250131222953
Implantable Lead Connection Status: 753985
Implantable Lead Connection Status: 753985
Implantable Lead Implant Date: 20180124
Implantable Lead Implant Date: 20180124
Implantable Lead Location: 753859
Implantable Lead Location: 753860
Implantable Lead Model: 5076
Implantable Lead Model: 5076
Implantable Pulse Generator Implant Date: 20180124
Lead Channel Impedance Value: 323 Ohm
Lead Channel Impedance Value: 323 Ohm
Lead Channel Impedance Value: 399 Ohm
Lead Channel Impedance Value: 456 Ohm
Lead Channel Pacing Threshold Amplitude: 0.75 V
Lead Channel Pacing Threshold Pulse Width: 0.4 ms
Lead Channel Sensing Intrinsic Amplitude: 0.25 mV
Lead Channel Sensing Intrinsic Amplitude: 0.25 mV
Lead Channel Sensing Intrinsic Amplitude: 7.375 mV
Lead Channel Sensing Intrinsic Amplitude: 7.375 mV
Lead Channel Setting Pacing Amplitude: 1.5 V
Lead Channel Setting Pacing Pulse Width: 0.4 ms
Lead Channel Setting Sensing Sensitivity: 0.9 mV
Zone Setting Status: 755011

## 2023-11-21 ENCOUNTER — Other Ambulatory Visit: Payer: Self-pay | Admitting: Family Medicine

## 2023-11-29 ENCOUNTER — Telehealth: Payer: Self-pay | Admitting: Primary Care

## 2023-11-29 NOTE — Telephone Encounter (Signed)
Patient states having symptoms of cough, wheezing and headache. Pharmacy is [facility]. Patient phone number is [phone].

## 2023-11-29 NOTE — Telephone Encounter (Signed)
Called and spoke to patient.  He stated that he developed cold like sx 1/30. Seen by PCP and tested for covid and flu- both were neg. He completed course of doxy with some relief in sx.  C/o mild headache, chest congestion, prod cough with clear sputum, wheezing, weakness and increased SOB with exertion. Denied f/c/s or additional sx. Sx worsen when he lays down. He is only getting 2hr of sleep nightly.  He is using albuterol solution BID and advair   BW, please advise. Thanks

## 2023-11-29 NOTE — Telephone Encounter (Signed)
Called and spoke with patient, scheduled patient to see Dr. Sherene Sires for an acute visit on 12/01/23 at 1:30 pm, advised to arrive at 1:15 pm for check in.  He asked if Dr. Sherene Sires would follow him for sleep as well.  I let him know that Dr. Sherene Sires did not do sleep, he would see him for the acute visit and we would get him established with a pulmonary/sleep doctor going forward.  He verbalized understanding.  Nothing further needed.

## 2023-11-29 NOTE — Progress Notes (Deleted)
 Guy Moore, male    DOB: 07-28-1934    MRN: [id number]   Brief patient profile:  ***  yo*** *** former [facility] Pt self-referred back to pulmonary clinic  for ACUTE OV   12/01/2023  for ***      History of Present Illness  12/01/2023  Pulmonary/ 1st office eval/ Sherene Sires / [place] [facility]  No chief complaint on file.    Dyspnea:  *** Cough: *** Sleep: *** SABA use: *** 02 use:*** LDSCT:***  No obvious day to day or daytime pattern/variability or assoc excess/ purulent sputum or mucus plugs or hemoptysis or cp or chest tightness, subjective wheeze or overt sinus or hb symptoms.    Also denies any obvious fluctuation of symptoms with weather or environmental changes or other aggravating or alleviating factors except as outlined above   No unusual exposure hx or h/o childhood pna/ asthma or knowledge of premature birth.  Current Allergies, Complete Past Medical History, Past Surgical History, Family History, and Social History were reviewed in [employer] record.  ROS  The following are not active complaints unless bolded Hoarseness, sore throat, dysphagia, dental problems, itching, sneezing,  nasal congestion or discharge of excess mucus or purulent secretions, ear ache,   fever, chills, sweats, unintended wt loss or wt gain, classically pleuritic or exertional cp,  orthopnea pnd or arm/hand swelling  or leg swelling, presyncope, palpitations, abdominal pain, anorexia, nausea, vomiting, diarrhea  or change in bowel habits or change in bladder habits, change in stools or change in urine, dysuria, hematuria,  rash, arthralgias, visual complaints, headache, numbness, weakness or ataxia or problems with walking or coordination,  change in mood or  memory.            Outpatient Medications Prior to Visit  Medication Sig Dispense Refill   albuterol (PROVENTIL) (2.5 MG/3ML) 0.083% nebulizer solution Take 3 mLs (2.5 mg total) by nebulization every 4 (four) hours as  needed for wheezing or shortness of breath. 75 mL 5   albuterol (VENTOLIN HFA) 108 (90 Base) MCG/ACT inhaler Inhale 2 puffs into the lungs every 4 (four) hours as needed for wheezing or shortness of breath. 18 g 11   amLODipine (NORVASC) 5 MG tablet TAKE 1 TABLET DAILY 90 tablet 3   BESIVANCE 0.6 % SUSP Place 1 drop into both eyes See admin instructions. Instill 1 drop into both eyes 3 times daily on the day of monthly eye injections then 4 times daily the day after     Carboxymethylcellul-Glycerin (LUBRICATING EYE DROPS OP) Place 1 drop into both eyes daily as needed (dry eyes).     cyanocobalamin (VITAMIN B12) 1000 MCG/ML injection Inject 1 mL (1,000 mcg total) into the muscle every 30 (thirty) days. 1 mL 0   doxycycline (VIBRA-TABS) 100 MG tablet Take 1 tablet (100 mg total) by mouth 2 (two) times daily. 20 tablet 0   fluticasone (FLONASE) 50 MCG/ACT nasal spray Place 1 spray into both nostrils daily. (Patient taking differently: Place 1 spray into both nostrils daily as needed for allergies.) 16 g 2   fluticasone-salmeterol (ADVAIR) 250-50 MCG/ACT AEPB Inhale 1 puff into the lungs in the morning and at bedtime. 180 each 1   hydrocortisone cream 1 % Apply 1 Application topically daily as needed for itching.     MAGNESIUM GLUCONATE PO Take 1 tablet by mouth daily.     Methylcellulose, Laxative, (CITRUCEL PO) Take 1-2 tablets by mouth daily as needed (constipation).     Multiple Vitamins-Minerals (  PRESERVISION AREDS 2) CAPS Take 1 capsule by mouth 2 (two) times daily.     neomycin-bacitracin-polymyxin (NEOSPORIN) OINT Apply 1 Application topically as needed for wound care.     omeprazole (PRILOSEC) 40 MG capsule TAKE 1 CAPSULE DAILY (Patient taking differently: Take 40 mg by mouth daily as needed (heartburn).) 90 capsule 3   potassium chloride SA (KLOR-CON M) 20 MEQ tablet TAKE 1 TABLET DAILY 90 tablet 3   pravastatin (PRAVACHOL) 40 MG tablet TAKE 1 TABLET AT BEDTIME 90 tablet 3   valsartan  (DIOVAN) 160 MG tablet TAKE 1 TABLET DAILY 90 tablet 3   XARELTO 20 MG TABS tablet TAKE 1 TABLET DAILY 90 tablet 3   No facility-administered medications prior to visit.    Past Medical History:  Diagnosis Date   Allergic rhinitis    Arthritis    Asthma    Atrial fibrillation [facility])    sees Dr. Bedelia Person at [facility]    Atrial flutter (HCC)    BCC (basal cell carcinoma of skin)    Nose   Chronic kidney disease    Complication of anesthesia    no problems per patient   COPD with emphysema [facility])    sees Dr. Coralyn Helling    Diverticulosis    Dysrhythmia    afib, flutter and tachycardia   Erosive esophagitis    Esophageal stricture    Gastric ulcer    Hearing loss    uses amplification   Hemorrhoids    Hiatal hernia    History of kidney stones    HTN (hypertension)    Hyperlipidemia    Knee problem    2% permanent partial impairment Right   Macular degeneration    Perineal abscess    Pneumonia    Pre-diabetes    Presence of permanent cardiac pacemaker    Sleep apnea    cpap      Objective:     There were no vitals taken for this visit.         Assessment   No problem-specific Assessment & Plan notes found for this encounter.     Sandrea Hughs, MD 11/29/2023

## 2023-11-29 NOTE — Telephone Encounter (Signed)
He would need an apt, has not been seen since May and difficult yo sort out without an exam If we do not have an apt slots open recommend he consider UC or returning to PCP If neither of these options are feasible I can double book on a day that I am not already

## 2023-12-01 ENCOUNTER — Ambulatory Visit: Payer: Medicare Other

## 2023-12-01 ENCOUNTER — Encounter: Payer: Self-pay | Admitting: Pulmonary Disease

## 2023-12-01 ENCOUNTER — Ambulatory Visit: Payer: Medicare Other | Admitting: Internal Medicine

## 2023-12-01 ENCOUNTER — Ambulatory Visit (INDEPENDENT_AMBULATORY_CARE_PROVIDER_SITE_OTHER): Payer: Medicare Other | Admitting: Pulmonary Disease

## 2023-12-01 VITALS — BP 152/84 | HR 77 | Ht 70.0 in | Wt 233.0 lb

## 2023-12-01 DIAGNOSIS — R059 Cough, unspecified: Secondary | ICD-10-CM | POA: Diagnosis not present

## 2023-12-01 DIAGNOSIS — J45998 Other asthma: Secondary | ICD-10-CM

## 2023-12-01 DIAGNOSIS — R0982 Postnasal drip: Secondary | ICD-10-CM | POA: Diagnosis not present

## 2023-12-01 DIAGNOSIS — R062 Wheezing: Secondary | ICD-10-CM | POA: Diagnosis not present

## 2023-12-01 DIAGNOSIS — R918 Other nonspecific abnormal finding of lung field: Secondary | ICD-10-CM | POA: Diagnosis not present

## 2023-12-01 MED ORDER — PREDNISONE 10 MG PO TABS: ORAL_TABLET | ORAL | 0 refills | Status: AC

## 2023-12-01 NOTE — Patient Instructions (Addendum)
Use flonase nasal spray, 1 spray per nostril daily for 2-4 weeks  Start steroid taper 20mg  daily for 3 days 10mg  daily for 3 days 5mg  daily for 3 days  We will check a chest x-ray today  Follow up as needed

## 2023-12-01 NOTE — Progress Notes (Signed)
Synopsis: Hx of OSA and Asthma  Subjective:   PATIENT ID: Guy Moore GENDER: male DOB: Jan 26, 1934, MRN: [id number]   HPI  Chief Complaint  Patient presents with   Acute Visit    Pt states 3/4 weeks ago started w/ a cold pcp placed him on doxy seem to become better then started again a lot of coughing at night wosre while laying down .   [name] is an 89 year old male, former smoker with atrial fibrillation, COPD/Asthma and OSA on CPAP who returns to pulmonary clinic for acute visit.   He has been experiencing a persistent cough for the past four weeks, which began with a cold that progressed to acute bronchitis. Initially, doxycycline improved his symptoms, but the cough returned and is particularly bothersome when lying down. He describes the cough as more manageable when upright and notes clear or white sputum production along with post-nasal drip.  The cough significantly disrupts his sleep, and he uses a CPAP machine for sleep apnea. He has been losing weight, which may necessitate a repeat sleep study. His sleep was previously affected by prolonged catheter use following prostate surgery.  He uses a nebulizer every four hours, which provides some relief. He has not been using Flonase recently, which he typically uses during allergy season. He experienced mild nausea while on doxycycline, which has persisted, and also reports a mild headache.  No current sore throat, fever, or chills. He notes post-nasal drip and clear or white sputum production, along with mild nausea and headache.  Past Medical History:  Diagnosis Date   Allergic rhinitis    Arthritis    Asthma    Atrial fibrillation [facility])    sees Dr. Bedelia Person at [facility]    Atrial flutter (HCC)    BCC (basal cell carcinoma of skin)    Nose   Chronic kidney disease    Complication of anesthesia    no problems per patient   COPD with emphysema (HCC)    sees Dr. Coralyn Helling     Diverticulosis    Dysrhythmia    afib, flutter and tachycardia   Erosive esophagitis    Esophageal stricture    Gastric ulcer    Hearing loss    uses amplification   Hemorrhoids    Hiatal hernia    History of kidney stones    HTN (hypertension)    Hyperlipidemia    Knee problem    2% permanent partial impairment Right   Macular degeneration    Perineal abscess    Pneumonia    Pre-diabetes    Presence of permanent cardiac pacemaker    Sleep apnea    cpap     Family History  Problem Relation Age of Onset   Stroke Mother    Breast cancer Mother    Heart attack Mother    Other Mother        brain tunor   Heart disease Father    Hypertension Father    Bladder Cancer Father    Parkinson's disease Father    Stroke Father    Breast cancer Brother    Atrial fibrillation Brother    Tremor Brother    Stroke Brother    Hypertension Brother    Breast cancer Maternal Grandmother    Heart disease Maternal Grandmother    Heart disease Maternal Grandfather    Arthritis Daughter    Heart disease Daughter    Colitis Daughter  Lymphocytic   Colon cancer Neg Hx      Social History   Socioeconomic History   Marital status: Married    Spouse name: Not on file   Number of children: 2   Years of education: Not on file   Highest education level: Master's degree (e.g., MA, MS, MEng, MEd, MSW, MBA)  Occupational History   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   Occupation: Med Engineer, manufacturing   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 He does not want heroic or futile care especially if he cannon communicate, read and enjoy his loved ones.     Social Drivers of Corporate investment banker Strain: Low Risk  (10/09/2023)   Overall Financial Resource Strain (CARDIA)    Difficulty of Paying Living Expenses: Not hard at all  Food Insecurity: Low Risk  (10/11/2023)   Received from [facility]   Hunger Vital Sign    Worried About Running Out of Food in the Last Year: Never true    Ran Out of Food in the Last Year: Never true  Transportation Needs: No Transportation Needs (10/11/2023)   Received from [facility]    In the past 12 months, has lack of reliable transportation kept you from medical appointments, meetings, work or from getting things needed for daily living? : No  Physical Activity: Insufficiently Active (10/09/2023)   Exercise Vital Sign    Days of Exercise per Week: 3 days    Minutes of Exercise per Session: 20 min  Stress: No Stress Concern Present (10/09/2023)   [employer] of Occupational Health - Occupational Stress Questionnaire    Feeling of Stress : Only a little  Social Connections: Socially Integrated (10/09/2023)   Social Connection and Isolation Panel [NHANES]    Frequency of Communication with Friends and Family: More than three times a week    Frequency of Social Gatherings with Friends and Family: Twice a week    Attends Religious Services: 1 to 4 times per year    Active Member of [employer] or Organizations: Yes    Attends Banker Meetings: 1 to 4 times per year    Marital Status: Married  Catering manager Violence: Not At Risk (03/27/2023)   Humiliation, Afraid, Rape, and Kick questionnaire    Fear of Current or Ex-Partner: No    Emotionally Abused: No    Physically Abused: No     Sexually Abused: No     No Known Allergies   Outpatient Medications Prior to Visit  Medication Sig Dispense Refill   albuterol (PROVENTIL) (2.5 MG/3ML) 0.083% nebulizer solution Take 3 mLs (2.5 mg total) by nebulization every 4 (four) hours as needed for wheezing or shortness of breath. 75 mL 5   albuterol (  VENTOLIN HFA) 108 (90 Base) MCG/ACT inhaler Inhale 2 puffs into the lungs every 4 (four) hours as needed for wheezing or shortness of breath. 18 g 11   amLODipine (NORVASC) 5 MG tablet TAKE 1 TABLET DAILY 90 tablet 3   BESIVANCE 0.6 % SUSP Place 1 drop into both eyes See admin instructions. Instill 1 drop into both eyes 3 times daily on the day of monthly eye injections then 4 times daily the day after     Carboxymethylcellul-Glycerin (LUBRICATING EYE DROPS OP) Place 1 drop into both eyes daily as needed (dry eyes).     cyanocobalamin (VITAMIN B12) 1000 MCG/ML injection Inject 1 mL (1,000 mcg total) into the muscle every 30 (thirty) days. 1 mL 0   doxycycline (VIBRA-TABS) 100 MG tablet Take 1 tablet (100 mg total) by mouth 2 (two) times daily. 20 tablet 0   fluticasone (FLONASE) 50 MCG/ACT nasal spray Place 1 spray into both nostrils daily. (Patient taking differently: Place 1 spray into both nostrils daily as needed for allergies.) 16 g 2   fluticasone-salmeterol (ADVAIR) 250-50 MCG/ACT AEPB Inhale 1 puff into the lungs in the morning and at bedtime. 180 each 1   hydrocortisone cream 1 % Apply 1 Application topically daily as needed for itching.     MAGNESIUM GLUCONATE PO Take 1 tablet by mouth daily.     Methylcellulose, Laxative, (CITRUCEL PO) Take 1-2 tablets by mouth daily as needed (constipation).     Multiple Vitamins-Minerals (PRESERVISION AREDS 2) CAPS Take 1 capsule by mouth 2 (two) times daily.     neomycin-bacitracin-polymyxin (NEOSPORIN) OINT Apply 1 Application topically as needed for wound care.     omeprazole (PRILOSEC) 40 MG capsule TAKE 1 CAPSULE DAILY (Patient taking  differently: Take 40 mg by mouth daily as needed (heartburn).) 90 capsule 3   potassium chloride SA (KLOR-CON M) 20 MEQ tablet TAKE 1 TABLET DAILY 90 tablet 3   pravastatin (PRAVACHOL) 40 MG tablet TAKE 1 TABLET AT BEDTIME 90 tablet 3   valsartan (DIOVAN) 160 MG tablet TAKE 1 TABLET DAILY 90 tablet 3   XARELTO 20 MG TABS tablet TAKE 1 TABLET DAILY 90 tablet 3   No facility-administered medications prior to visit.    Review of Systems  Constitutional:  Negative for chills, fever, malaise/fatigue and weight loss.  HENT:  Positive for congestion. Negative for sinus pain and sore throat.   Eyes: Negative.   Respiratory:  Positive for cough and sputum production. Negative for hemoptysis, shortness of breath and wheezing.   Cardiovascular:  Negative for chest pain, palpitations, orthopnea, claudication and leg swelling.  Gastrointestinal:  Negative for abdominal pain, heartburn, nausea and vomiting.  Genitourinary: Negative.   Musculoskeletal:  Negative for joint pain and myalgias.  Skin:  Negative for rash.  Neurological:  Negative for weakness.  Endo/Heme/Allergies: Negative.   Psychiatric/Behavioral: Negative.      Objective:   Vitals:   12/01/23 0916  BP: (!) 152/84  Pulse: 77  SpO2: 95%  Weight: 233 lb (105.7 kg)  Height: 5\' 10"  (1.778 m)   Physical Exam Constitutional:      General: He is not in acute distress.    Appearance: Normal appearance.  Eyes:     General: No scleral icterus.    Conjunctiva/sclera: Conjunctivae normal.  Cardiovascular:     Rate and Rhythm: Normal rate and regular rhythm.  Pulmonary:     Breath sounds: No wheezing, rhonchi or rales.  Musculoskeletal:     Right lower leg: No edema.  Left lower leg: No edema.  Skin:    General: Skin is warm and dry.  Neurological:     General: No focal deficit present.    CBC    Component Value Date/Time   WBC 2.6 (L) 08/31/2023 1325   WBC 3.9 (L) 03/27/2023 0535   RBC 4.50 08/31/2023 1325   HGB  13.2 08/31/2023 1325   HCT 41.1 08/31/2023 1325   PLT 109 (L) 08/31/2023 1325   MCV 91.3 08/31/2023 1325   MCH 29.3 08/31/2023 1325   MCHC 32.1 08/31/2023 1325   RDW 17.5 (H) 08/31/2023 1325   LYMPHSABS 0.4 (L) 08/31/2023 1325   MONOABS 0.3 08/31/2023 1325   EOSABS 0.1 08/31/2023 1325   BASOSABS 0.0 08/31/2023 1325      Latest Ref Rng & Units 08/31/2023    1:25 PM 03/27/2023    5:35 AM 03/26/2023   10:59 AM  BMP  Glucose 70 - 99 mg/dL 82  295  621   BUN 8 - 23 mg/dL 20  16  20    Creatinine 0.61 - 1.24 mg/dL 3.08  6.57  8.46   Sodium 135 - 145 mmol/L 138  135  138   Potassium 3.5 - 5.1 mmol/L 4.5  4.0  4.0   Chloride 98 - 111 mmol/L 104  101  103   CO2 22 - 32 mmol/L 29  26  27    Calcium 8.9 - 10.3 mg/dL 8.9  8.4  8.3    Chest imaging: CT Chest 05/25/21 Mediastinum/Nodes: No axillary or supraclavicular adenopathy. No mediastinal or hilar adenopathy. No pericardial fluid. Esophagus normal.   Lungs/Pleura: There is mild bibasilar ground-glass opacities consistent with atelectasis. Band of atelectasis in the RIGHT middle lobe. No airspace disease. No pneumothorax. Mild centrilobular emphysema the upper lobes.  PFT:    Latest Ref Rng & Units 12/01/2016   10:07 AM  PFT Results  FVC-Pre L 2.90   FVC-Predicted Pre % 72   FVC-Post L 3.17   FVC-Predicted Post % 79   Pre FEV1/FVC % % 59   Post FEV1/FCV % % 61   FEV1-Pre L 1.71   FEV1-Predicted Pre % 60   FEV1-Post L 1.93   DLCO uncorrected ml/min/mmHg 18.85   DLCO UNC% % 58   DLCO corrected ml/min/mmHg 18.49   DLCO COR %Predicted % 57   DLVA Predicted % 73   TLC L 6.59   TLC % Predicted % 93   RV % Predicted % 119     Labs:  Path:  Echo:  Heart Catheterization:    Assessment & Plan:   Post-viral reactive airway disease - Plan: predniSONE (DELTASONE) 10 MG tablet, DG Chest 2 View  Post-nasal drip - Plan: predniSONE (DELTASONE) 10 MG tablet  Discussion: Guy Moore is an 89 year old male, former smoker  with atrial fibrillation, COPD/Asthma and OSA on CPAP who returns to pulmonary clinic for acute visit.   Post-viral cough   Persistent cough for 4 weeks following a cold, worse when lying down. Clear sputum production. Recent treatment with doxycycline for acute bronchitis with initial improvement followed by relapse. No fever, chills, or sore throat.   -Order chest x-ray today to rule out pneumonia or other complications.   -Start Flonase nasal spray to reduce postnasal drip contributing to cough.   -Initiate prednisone 20mg  daily for 3 days, then 10mg  daily for 3 days, then 5mg  daily for 3 days to reduce inflammation and improve cough.    Follow up as needed.  Melody Comas, MD [facility] Office: [phone]    Current Outpatient Medications:    albuterol (PROVENTIL) (2.5 MG/3ML) 0.083% nebulizer solution, Take 3 mLs (2.5 mg total) by nebulization every 4 (four) hours as needed for wheezing or shortness of breath., Disp: 75 mL, Rfl: 5   albuterol (VENTOLIN HFA) 108 (90 Base) MCG/ACT inhaler, Inhale 2 puffs into the lungs every 4 (four) hours as needed for wheezing or shortness of breath., Disp: 18 g, Rfl: 11   amLODipine (NORVASC) 5 MG tablet, TAKE 1 TABLET DAILY, Disp: 90 tablet, Rfl: 3   BESIVANCE 0.6 % SUSP, Place 1 drop into both eyes See admin instructions. Instill 1 drop into both eyes 3 times daily on the day of monthly eye injections then 4 times daily the day after, Disp: , Rfl:    Carboxymethylcellul-Glycerin (LUBRICATING EYE DROPS OP), Place 1 drop into both eyes daily as needed (dry eyes)., Disp: , Rfl:    cyanocobalamin (VITAMIN B12) 1000 MCG/ML injection, Inject 1 mL (1,000 mcg total) into the muscle every 30 (thirty) days., Disp: 1 mL, Rfl: 0   doxycycline (VIBRA-TABS) 100 MG tablet, Take 1 tablet (100 mg total) by mouth 2 (two) times daily., Disp: 20 tablet, Rfl: 0   fluticasone (FLONASE) 50 MCG/ACT nasal spray, Place 1 spray into both nostrils  daily. (Patient taking differently: Place 1 spray into both nostrils daily as needed for allergies.), Disp: 16 g, Rfl: 2   fluticasone-salmeterol (ADVAIR) 250-50 MCG/ACT AEPB, Inhale 1 puff into the lungs in the morning and at bedtime., Disp: 180 each, Rfl: 1   hydrocortisone cream 1 %, Apply 1 Application topically daily as needed for itching., Disp: , Rfl:    MAGNESIUM GLUCONATE PO, Take 1 tablet by mouth daily., Disp: , Rfl:    Methylcellulose, Laxative, (CITRUCEL PO), Take 1-2 tablets by mouth daily as needed (constipation)., Disp: , Rfl:    Multiple Vitamins-Minerals (PRESERVISION AREDS 2) CAPS, Take 1 capsule by mouth 2 (two) times daily., Disp: , Rfl:    neomycin-bacitracin-polymyxin (NEOSPORIN) OINT, Apply 1 Application topically as needed for wound care., Disp: , Rfl:    omeprazole (PRILOSEC) 40 MG capsule, TAKE 1 CAPSULE DAILY (Patient taking differently: Take 40 mg by mouth daily as needed (heartburn).), Disp: 90 capsule, Rfl: 3   potassium chloride SA (KLOR-CON M) 20 MEQ tablet, TAKE 1 TABLET DAILY, Disp: 90 tablet, Rfl: 3   pravastatin (PRAVACHOL) 40 MG tablet, TAKE 1 TABLET AT BEDTIME, Disp: 90 tablet, Rfl: 3   predniSONE (DELTASONE) 10 MG tablet, Take 2 tablets (20 mg total) by mouth daily with breakfast for 3 days, THEN 1 tablet (10 mg total) daily with breakfast for 3 days, THEN 0.5 tablets (5 mg total) daily with breakfast for 3 days., Disp: 11 tablet, Rfl: 0   valsartan (DIOVAN) 160 MG tablet, TAKE 1 TABLET DAILY, Disp: 90 tablet, Rfl: 3   XARELTO 20 MG TABS tablet, TAKE 1 TABLET DAILY, Disp: 90 tablet, Rfl: 3

## 2023-12-10 ENCOUNTER — Ambulatory Visit (INDEPENDENT_AMBULATORY_CARE_PROVIDER_SITE_OTHER): Payer: Medicare Other

## 2023-12-10 DIAGNOSIS — E538 Deficiency of other specified B group vitamins: Secondary | ICD-10-CM | POA: Diagnosis not present

## 2023-12-10 MED ORDER — CYANOCOBALAMIN 1000 MCG/ML IJ SOLN
1000.0000 ug | Freq: Once | INTRAMUSCULAR | Status: AC
Start: 2023-12-10 — End: 2023-12-10
  Administered 2023-12-10: 1000 ug via INTRAMUSCULAR

## 2023-12-10 NOTE — Progress Notes (Addendum)
 Pt here for monthly B12 injection per Dr Clent Ridges  B12 given IM and pt tolerated injection well.  Next B12 injection scheduled for 01/07/24

## 2023-12-13 ENCOUNTER — Encounter (INDEPENDENT_AMBULATORY_CARE_PROVIDER_SITE_OTHER): Payer: Medicare Other | Admitting: Ophthalmology

## 2023-12-13 DIAGNOSIS — I10 Essential (primary) hypertension: Secondary | ICD-10-CM | POA: Diagnosis not present

## 2023-12-13 DIAGNOSIS — D3131 Benign neoplasm of right choroid: Secondary | ICD-10-CM | POA: Diagnosis not present

## 2023-12-13 DIAGNOSIS — H353231 Exudative age-related macular degeneration, bilateral, with active choroidal neovascularization: Secondary | ICD-10-CM

## 2023-12-13 DIAGNOSIS — H35033 Hypertensive retinopathy, bilateral: Secondary | ICD-10-CM | POA: Diagnosis not present

## 2023-12-13 DIAGNOSIS — H43813 Vitreous degeneration, bilateral: Secondary | ICD-10-CM | POA: Diagnosis not present

## 2023-12-14 ENCOUNTER — Encounter: Payer: Self-pay | Admitting: Pulmonary Disease

## 2023-12-16 DIAGNOSIS — D074 Carcinoma in situ of penis: Secondary | ICD-10-CM | POA: Diagnosis not present

## 2023-12-16 DIAGNOSIS — N481 Balanitis: Secondary | ICD-10-CM | POA: Diagnosis not present

## 2023-12-16 DIAGNOSIS — N401 Enlarged prostate with lower urinary tract symptoms: Secondary | ICD-10-CM | POA: Diagnosis not present

## 2023-12-16 DIAGNOSIS — R350 Frequency of micturition: Secondary | ICD-10-CM | POA: Diagnosis not present

## 2023-12-22 NOTE — Progress Notes (Signed)
 Remote pacemaker transmission.

## 2024-01-03 DIAGNOSIS — L2989 Other pruritus: Secondary | ICD-10-CM | POA: Diagnosis not present

## 2024-01-03 DIAGNOSIS — L308 Other specified dermatitis: Secondary | ICD-10-CM | POA: Diagnosis not present

## 2024-01-07 ENCOUNTER — Ambulatory Visit: Payer: Medicare Other | Admitting: *Deleted

## 2024-01-07 DIAGNOSIS — E538 Deficiency of other specified B group vitamins: Secondary | ICD-10-CM

## 2024-01-07 MED ORDER — CYANOCOBALAMIN 1000 MCG/ML IJ SOLN
1000.0000 ug | Freq: Once | INTRAMUSCULAR | Status: AC
  Administered 2024-01-07: 1000 ug via INTRAMUSCULAR

## 2024-01-07 NOTE — Progress Notes (Signed)
 Per orders of Dr. Casimiro Needle, injection of Cyanocobalamin given by Johnella Moloney. Patient tolerated injection well.

## 2024-01-17 ENCOUNTER — Encounter (INDEPENDENT_AMBULATORY_CARE_PROVIDER_SITE_OTHER): Admitting: Ophthalmology

## 2024-01-17 DIAGNOSIS — H43813 Vitreous degeneration, bilateral: Secondary | ICD-10-CM | POA: Diagnosis not present

## 2024-01-17 DIAGNOSIS — H35033 Hypertensive retinopathy, bilateral: Secondary | ICD-10-CM

## 2024-01-17 DIAGNOSIS — H353231 Exudative age-related macular degeneration, bilateral, with active choroidal neovascularization: Secondary | ICD-10-CM | POA: Diagnosis not present

## 2024-01-17 DIAGNOSIS — I10 Essential (primary) hypertension: Secondary | ICD-10-CM | POA: Diagnosis not present

## 2024-01-17 DIAGNOSIS — D3131 Benign neoplasm of right choroid: Secondary | ICD-10-CM

## 2024-01-19 ENCOUNTER — Ambulatory Visit (INDEPENDENT_AMBULATORY_CARE_PROVIDER_SITE_OTHER)

## 2024-01-19 VITALS — Ht 70.0 in | Wt 222.0 lb

## 2024-01-19 DIAGNOSIS — Z0000 Encounter for general adult medical examination without abnormal findings: Secondary | ICD-10-CM | POA: Diagnosis not present

## 2024-01-19 NOTE — Patient Instructions (Addendum)
 Guy Moore , Thank you for taking time to come for your Medicare Wellness Visit. I appreciate your ongoing commitment to your health goals. Please review the following plan we discussed and let me know if I can assist you in the future.   Referrals/Orders/Follow-Ups/Clinician Recommendations:   This is a list of the screening recommended for you and due dates:  Health Maintenance  Topic Date Due   Zoster (Shingles) Vaccine (1 of 2) 11/26/1952   Complete foot exam   06/28/2015   DTaP/Tdap/Td vaccine (2 - Tdap) 10/10/2015   Hemoglobin A1C  07/30/2023   Flu Shot  05/12/2024   COVID-19 Vaccine (8 - Pfizer risk 2024-25 season) 07/03/2024   Eye exam for diabetics  01/16/2025   Medicare Annual Wellness Visit  01/18/2025   Pneumonia Vaccine  Completed   HPV Vaccine  Aged Out    Advanced directives: (Copy Requested) Please bring a copy of your health care power of attorney and living will to the office to be added to your chart at your convenience. You can mail to [facility] [address] [place], [place] [address] or email to [email or web address]  Next Medicare Annual Wellness Visit scheduled for next year: Yes

## 2024-01-19 NOTE — Progress Notes (Signed)
 Subjective:   Guy Moore is a 89 y.o. who presents for a Medicare Wellness preventive visit.  Visit Complete: Virtual I connected with  Deri Fuelling on 01/19/24 by a audio enabled telemedicine application and verified that I am speaking with the correct person using two identifiers.  Patient Location: Home  Provider Location: Home Office  I discussed the limitations of evaluation and management by telemedicine. The patient expressed understanding and agreed to proceed.  Vital Signs: Because this visit was a virtual/telehealth visit, some criteria may be missing or patient reported. Any vitals not documented were not able to be obtained and vitals that have been documented are patient reported.    Persons Participating in Visit: Patient.  AWV Questionnaire: Yes: Patient Medicare AWV questionnaire was completed by the patient on 01/16/24; I have confirmed that all information answered by patient is correct and no changes since this date.  Cardiac Risk Factors include: advanced age (>70men, >46 women);hypertension;male gender     Objective:    Today's Vitals   01/19/24 1342  Weight: 222 lb (100.7 kg)  Height: 5\' 10"  (1.778 m)   Body mass index is 31.85 kg/m.     01/19/2024    1:51 PM 08/23/2023    7:46 AM 03/26/2023    7:27 AM 03/16/2023    1:21 PM 01/28/2023   10:53 AM 01/18/2023   11:30 AM 11/10/2022    2:28 PM  Advanced Directives  Does Patient Have a Medical Advance Directive? Yes Yes Yes Yes Yes Yes Yes  Type of Estate agent of [place];Living will  Healthcare Power of [place];Living will  Healthcare Power of [place];Living will Healthcare Power of [place];Living will   Does patient want to make changes to medical advance directive?  No - Patient declined No - Patient declined No - Patient declined   No - Patient declined  Copy of Healthcare Power of Attorney in Chart? No - copy requested  No - copy requested   No - copy requested      Current Medications (verified) Outpatient Encounter Medications as of 01/19/2024  Medication Sig   albuterol (PROVENTIL) (2.5 MG/3ML) 0.083% nebulizer solution Take 3 mLs (2.5 mg total) by nebulization every 4 (four) hours as needed for wheezing or shortness of breath.   albuterol (VENTOLIN HFA) 108 (90 Base) MCG/ACT inhaler Inhale 2 puffs into the lungs every 4 (four) hours as needed for wheezing or shortness of breath.   amLODipine (NORVASC) 5 MG tablet TAKE 1 TABLET DAILY   BESIVANCE 0.6 % SUSP Place 1 drop into both eyes See admin instructions. Instill 1 drop into both eyes 3 times daily on the day of monthly eye injections then 4 times daily the day after   Carboxymethylcellul-Glycerin (LUBRICATING EYE DROPS OP) Place 1 drop into both eyes daily as needed (dry eyes).   cyanocobalamin (VITAMIN B12) 1000 MCG/ML injection Inject 1 mL (1,000 mcg total) into the muscle every 30 (thirty) days.   doxycycline (VIBRA-TABS) 100 MG tablet Take 1 tablet (100 mg total) by mouth 2 (two) times daily.   fluticasone (FLONASE) 50 MCG/ACT nasal spray Place 1 spray into both nostrils daily. (Patient taking differently: Place 1 spray into both nostrils daily as needed for allergies.)   fluticasone-salmeterol (ADVAIR) 250-50 MCG/ACT AEPB Inhale 1 puff into the lungs in the morning and at bedtime.   hydrocortisone cream 1 % Apply 1 Application topically daily as needed for itching.   MAGNESIUM GLUCONATE PO Take 1 tablet by mouth daily.  Methylcellulose, Laxative, (CITRUCEL PO) Take 1-2 tablets by mouth daily as needed (constipation).   Multiple Vitamins-Minerals (PRESERVISION AREDS 2) CAPS Take 1 capsule by mouth 2 (two) times daily.   neomycin-bacitracin-polymyxin (NEOSPORIN) OINT Apply 1 Application topically as needed for wound care.   omeprazole (PRILOSEC) 40 MG capsule TAKE 1 CAPSULE DAILY (Patient taking differently: Take 40 mg by mouth daily as needed (heartburn).)   potassium chloride SA (KLOR-CON M) 20  MEQ tablet TAKE 1 TABLET DAILY   pravastatin (PRAVACHOL) 40 MG tablet TAKE 1 TABLET AT BEDTIME   valsartan (DIOVAN) 160 MG tablet TAKE 1 TABLET DAILY   XARELTO 20 MG TABS tablet TAKE 1 TABLET DAILY   No facility-administered encounter medications on file as of 01/19/2024.    Allergies (verified) Patient has no known allergies.   History: Past Medical History:  Diagnosis Date   Allergic rhinitis    Arthritis    Asthma    Atrial fibrillation [facility])    sees Dr. Bedelia Person at [facility]    Atrial flutter (HCC)    BCC (basal cell carcinoma of skin)    Nose   Chronic kidney disease    Complication of anesthesia    no problems per patient   COPD with emphysema (HCC)    sees Dr. Coralyn Helling    Diverticulosis    Dysrhythmia    afib, flutter and tachycardia   Erosive esophagitis    Esophageal stricture    Gastric ulcer    Hearing loss    uses amplification   Hemorrhoids    Hiatal hernia    History of kidney stones    HTN (hypertension)    Hyperlipidemia    Knee problem    2% permanent partial impairment Right   Macular degeneration    Perineal abscess    Pneumonia    Pre-diabetes    Presence of permanent cardiac pacemaker    Sleep apnea    cpap   Past Surgical History:  Procedure Laterality Date   CARDIAC ELECTROPHYSIOLOGY STUDY AND ABLATION     x 4 (sees Dr. Elmer Picker at [facility])   CO2 LASER APPLICATION N/A 03/26/2023   Procedure: CO2 LASER ABLATION OF PENIS;  Surgeon: Jannifer Hick, MD;  Location: [facility];  Service: Urology;  Laterality: N/A;   CYST EXCISION PERINEAL     CYSTOSCOPY WITH URETEROSCOPY AND STENT PLACEMENT Right 10/29/2022   Procedure: CYSTOSCOPY WITH RETROGRADE WITH URETHERAL DILITATIONRIGHT DIAGNOSTIC  URETEROSCOPY AND STENT PLACEMENT;  Surgeon: Jannifer Hick, MD;  Location: [facility];  Service: Urology;  Laterality: Right;   CYSTOSCOPY/URETEROSCOPY/HOLMIUM LASER/STENT PLACEMENT Right 11/27/2022   Procedure: CYSTOSCOPY/ RIGHT  RETROGRADE PYELOGRAM/RIGHT HOLMIUM LASER/RIGHT STENT PLACEMENT/BASKET;  Surgeon: Jannifer Hick, MD;  Location: [facility];  Service: Urology;  Laterality: Right;  60 MINUTES NEEDED FOR CASE   INSERT / REPLACE / REMOVE PACEMAKER     NOSE SURGERY     septoplasty   PENILE BIOPSY N/A 11/27/2022   Procedure: PENILE BIOPSY;  Surgeon: Jannifer Hick, MD;  Location: [facility];  Service: Urology;  Laterality: N/A;   TRANSURETHRAL RESECTION OF PROSTATE N/A 03/26/2023   Procedure: BIPOLAR TRANSURETHRAL RESECTION OF THE PROSTATE (TURP);  Surgeon: Jannifer Hick, MD;  Location: [facility];  Service: Urology;  Laterality: N/A;  90 MINUTES NEEDED FOR CASE   UVULOPALATOPHARYNGOPLASTY  06/13/1979   VASECTOMY  06/12/1969   w/complications   Family History  Problem Relation Age of Onset   Stroke Mother  Breast cancer Mother    Heart attack Mother    Other Mother        brain tunor   Heart disease Father    Hypertension Father    Bladder Cancer Father    Parkinson's disease Father    Stroke Father    Breast cancer Brother    Atrial fibrillation Brother    Tremor Brother    Stroke Brother    Hypertension Brother    Breast cancer Maternal Grandmother    Heart disease Maternal Grandmother    Heart disease Maternal Grandfather    Arthritis Daughter    Heart disease Daughter    Colitis Daughter        Lymphocytic   Colon cancer Neg Hx    Social History   Socioeconomic History   Marital status: Married    Spouse name: Not on file   Number of children: 2   Years of education: Not on file   Highest education level: Master's degree (e.g., MA, MS, MEng, MEd, MSW, MBA)  Occupational History   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   Occupation: Med Engineer, manufacturing   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 He does not want heroic or futile care especially if he cannon communicate, read and enjoy his loved ones.     Social Drivers of Corporate investment banker Strain: Low Risk  (01/19/2024)   Overall Financial Resource Strain (CARDIA)    Difficulty of Paying Living Expenses: Not hard at all  Food Insecurity: No Food Insecurity (01/19/2024)   Hunger Vital Sign    Worried About Running Out of Food in the Last Year: Never true    Ran Out of Food in the Last Year: Never true  Transportation Needs: No Transportation Needs (01/19/2024)   PRAPARE - Administrator, Civil Service (Medical): No    Lack of Transportation (Non-Medical): No  Physical Activity: Sufficiently Active (01/19/2024)   Exercise Vital Sign    Days of Exercise per Week: 7 days    Minutes of Exercise per Session: 30 min  Stress: No Stress Concern Present (01/19/2024)   [employer] of Occupational Health - Occupational Stress Questionnaire    Feeling of Stress : Not at all  Social Connections: Socially Integrated (01/19/2024)   Social Connection and Isolation Panel [NHANES]    Frequency of Communication with Friends and Family: More than three times a week    Frequency of Social Gatherings with Friends and Family: More than three times a week    Attends Religious Services: More than 4 times per year    Active Member of [employer] or Organizations: Yes    Attends Engineer, structural: More than 4 times per year    Marital Status: Married  Tobacco Counseling Counseling given: Not  Answered    Clinical Intake:  Pre-visit preparation completed: Yes  Pain : No/denies pain     BMI - recorded: 31.85 Nutritional Status: BMI > 30  Obese Nutritional Risks: None Diabetes: No  Lab Results  Component Value Date   HGBA1C 5.8 (H) 01/28/2023   HGBA1C 6.2 (H) 11/10/2022   HGBA1C 6.3 02/20/2020     How often do you need to have someone help you when you read instructions, pamphlets, or other written materials from your doctor or pharmacy?: 1 - Never  Interpreter Needed?: No  Information entered by :: Theresa Mulligan LPN   Activities of Daily Living     01/19/2024    1:50 PM 01/16/2024    8:00 PM  In your present state of health, do you have any difficulty performing the following activities:  Hearing? 1 1  Comment Wears Hearing Aids   Vision? 1 1  Comment Followed by medical attention   Difficulty concentrating or making decisions? 0 0  Walking or climbing stairs? 0 0  Dressing or bathing? 0 0  Doing errands, shopping? 0 0  Preparing Food and eating ? N N  Using the Toilet? N N  In the past six months, have you accidently leaked urine? N N  Do you have problems with loss of bowel control? N N  Managing your Medications? N N  Managing your Finances? N N  Housekeeping or managing your Housekeeping? N N    Patient Care Team: Nelwyn Salisbury, MD as PCP - General (Family Medicine) Regan Lemming, MD as PCP - Electrophysiology (Cardiology) Rendall, Cresenciano Genre, MD (Inactive) (Orthopedic Surgery) Mardella Layman, MD (Gastroenterology) Bedelia Person, MD as Referring Physician (Cardiology) Clance, Maree Krabbe, MD (Pulmonary Disease) Sherrie George, MD as Consulting Physician (Ophthalmology) Jannifer Hick, MD as Consulting Physician (Urology) Ernesto Rutherford, MD as Consulting Physician (Ophthalmology) Si Gaul, MD as Consulting Physician (Oncology)  Indicate any recent Medical Services you may have received from other than [facility] providers in  the past year (date may be approximate).     Assessment:   This is a routine wellness examination for Guy Moore.  Hearing/Vision screen Hearing Screening - Comments:: Wears Hearing Aids Vision Screening - Comments:: Wears rx glasses - up to date with routine eye exams with  Dr Dione Booze   Goals Addressed               This Visit's Progress     Remain Active (pt-stated)         Depression Screen     01/19/2024    1:47 PM 04/08/2023    1:56 PM 01/18/2023   11:22 AM 11/13/2021   10:03 AM 11/12/2020    1:47 PM 04/12/2015    1:06 PM 01/24/2014    3:42 PM  PHQ 2/9 Scores  PHQ - 2 Score 0 0 0 0 0 0 0    Fall Risk     01/19/2024    1:51 PM 01/16/2024    8:00 PM 12/01/2023    9:15 AM 05/26/2023   11:38 PM 04/08/2023    1:48 PM  Fall Risk   Falls in the past year? 0 0 0 0 0  Number falls in past yr: 0 0     Injury with Fall? 0 0     Risk for fall due to : No Fall Risks      Follow up Falls prevention discussed;Falls evaluation completed  MEDICARE RISK AT HOME:  Medicare Risk at Home Any stairs in or around the home?: Yes If so, are there any without handrails?: No Home free of loose throw rugs in walkways, pet beds, electrical cords, etc?: Yes Adequate lighting in your home to reduce risk of falls?: Yes Life alert?: No Use of a cane, walker or w/c?: No Grab bars in the bathroom?: Yes Shower chair or bench in shower?: No Elevated toilet seat or a handicapped toilet?: No  TIMED UP AND GO:  Was the test performed?  No  Cognitive Function: 6CIT completed        01/19/2024    1:51 PM 01/18/2023   11:29 AM 11/13/2021   10:11 AM  6CIT Screen  What Year? 0 points 0 points 0 points  What month? 0 points 0 points 0 points  What time? 0 points 0 points 0 points  Count back from 20 0 points 0 points 0 points  Months in reverse 0 points 0 points 0 points  Repeat phrase 0 points 0 points 0 points  Total Score 0 points 0 points 0 points    Immunizations Immunization History   Administered Date(s) Administered   Fluad Quad(high Dose 65+) 06/21/2019, 07/10/2020, 07/22/2022   Fluad Trivalent(High Dose 65+) 07/06/2023   Influenza Whole 08/12/2006, 10/21/2007, 07/10/2008, 07/19/2009, 07/12/2012   Influenza, High Dose Seasonal PF 08/25/2018   Influenza, Seasonal, Injecte, Preservative Fre 08/04/2013   Influenza-Unspecified 08/16/2015, 08/15/2016, 08/29/2017, 08/18/2021   PFIZER Comirnaty(Gray Top)Covid-19 Tri-Sucrose Vaccine 02/19/2021   PFIZER(Purple Top)SARS-COV-2 Vaccination 11/19/2019, 12/13/2019, 08/01/2020, 08/02/2020   Pfizer Covid-19 Vaccine Bivalent Booster 11yrs & up 08/18/2021   Pneumococcal Conjugate-13 01/17/2015   Pneumococcal Polysaccharide-23 04/20/1997, 03/14/2013   Td 10/09/2005   Unspecified SARS-COV-2 Vaccination 01/01/2024   Zoster, Live 03/19/2009    Screening Tests Health Maintenance  Topic Date Due   Zoster Vaccines- Shingrix (1 of 2) 11/26/1952   FOOT EXAM  06/28/2015   DTaP/Tdap/Td (2 - Tdap) 10/10/2015   HEMOGLOBIN A1C  07/30/2023   INFLUENZA VACCINE  05/12/2024   COVID-19 Vaccine (8 - Pfizer risk 2024-25 season) 07/03/2024   OPHTHALMOLOGY EXAM  01/16/2025   Medicare Annual Wellness (AWV)  01/18/2025   Pneumonia Vaccine 24+ Years old  Completed   HPV VACCINES  Aged Out    Health Maintenance  Health Maintenance Due  Topic Date Due   Zoster Vaccines- Shingrix (1 of 2) 11/26/1952   FOOT EXAM  06/28/2015   DTaP/Tdap/Td (2 - Tdap) 10/10/2015   HEMOGLOBIN A1C  07/30/2023   Health Maintenance Items Addressed: Hemoglobin A1C and foot exam deferred. Patient declined Zoster/Shingles and T-dap vaccine  Additional Screening:  Vision Screening: Recommended annual ophthalmology exams for early detection of glaucoma and other disorders of the eye.  Dental Screening: Recommended annual dental exams for proper oral hygiene  Community Resource Referral / Chronic Care Management: CRR required this visit?  No   CCM required this  visit?  No     Plan:     I have personally reviewed and noted the following in the patient's chart:   Medical and social history Use of alcohol, tobacco or illicit drugs  Current medications and supplements including opioid prescriptions. Patient is not currently taking opioid prescriptions. Functional ability and status Nutritional status Physical activity Advanced directives List of other physicians Hospitalizations, surgeries, and ER visits in previous 12 months Vitals Screenings to include cognitive, depression, and falls Referrals and appointments  In addition, I have reviewed and discussed with patient certain preventive protocols,  quality metrics, and best practice recommendations. A written personalized care plan for preventive services as well as general preventive health recommendations were provided to patient.     Tillie Rung, LPN   06/18/2951   After Visit Summary: (MyChart) Due to this being a telephonic visit, the after visit summary with patients personalized plan was offered to patient via MyChart   Notes: Nothing significant to report at this time.

## 2024-01-24 ENCOUNTER — Other Ambulatory Visit: Payer: Self-pay | Admitting: Family Medicine

## 2024-02-09 ENCOUNTER — Ambulatory Visit (INDEPENDENT_AMBULATORY_CARE_PROVIDER_SITE_OTHER)

## 2024-02-09 ENCOUNTER — Other Ambulatory Visit: Payer: Self-pay | Admitting: Primary Care

## 2024-02-09 ENCOUNTER — Encounter: Payer: Self-pay | Admitting: Primary Care

## 2024-02-09 ENCOUNTER — Ambulatory Visit: Admitting: Primary Care

## 2024-02-09 VITALS — BP 127/77 | HR 78 | Temp 97.8°F | Ht 70.0 in | Wt 231.2 lb

## 2024-02-09 DIAGNOSIS — Z950 Presence of cardiac pacemaker: Secondary | ICD-10-CM | POA: Diagnosis not present

## 2024-02-09 DIAGNOSIS — J4489 Other specified chronic obstructive pulmonary disease: Secondary | ICD-10-CM

## 2024-02-09 DIAGNOSIS — R0982 Postnasal drip: Secondary | ICD-10-CM | POA: Diagnosis not present

## 2024-02-09 DIAGNOSIS — J189 Pneumonia, unspecified organism: Secondary | ICD-10-CM

## 2024-02-09 DIAGNOSIS — J441 Chronic obstructive pulmonary disease with (acute) exacerbation: Secondary | ICD-10-CM

## 2024-02-09 DIAGNOSIS — E538 Deficiency of other specified B group vitamins: Secondary | ICD-10-CM | POA: Diagnosis not present

## 2024-02-09 DIAGNOSIS — J9811 Atelectasis: Secondary | ICD-10-CM | POA: Diagnosis not present

## 2024-02-09 DIAGNOSIS — G4733 Obstructive sleep apnea (adult) (pediatric): Secondary | ICD-10-CM | POA: Diagnosis not present

## 2024-02-09 DIAGNOSIS — R918 Other nonspecific abnormal finding of lung field: Secondary | ICD-10-CM | POA: Diagnosis not present

## 2024-02-09 MED ORDER — GUAIFENESIN ER 600 MG PO TB12: 600.0000 mg | ORAL_TABLET | Freq: Two times a day (BID) | ORAL | 1 refills | Status: DC | PRN

## 2024-02-09 MED ORDER — CYANOCOBALAMIN 1000 MCG/ML IJ SOLN
1000.0000 ug | Freq: Once | INTRAMUSCULAR | Status: AC
Start: 2024-02-09 — End: 2024-02-09
  Administered 2024-02-09: 1000 ug via INTRAMUSCULAR

## 2024-02-09 MED ORDER — AZELASTINE HCL 0.1 % NA SOLN
1.0000 | Freq: Two times a day (BID) | NASAL | 1 refills | Status: AC
Start: 2024-02-09 — End: ?

## 2024-02-09 NOTE — Progress Notes (Signed)
 Patient is in office today for a nurse visit for B12 Injection. Patient Injection was given in the  Left deltoid. Patient tolerated injection well.

## 2024-02-09 NOTE — Patient Instructions (Addendum)
-  OBSTRUCTIVE SLEEP APNEA: Obstructive sleep apnea is a condition where your breathing stops and starts during sleep. Your sleep apnea is well-controlled with the use of a CPAP machine, and your breathing has improved with weight loss. Continue using your CPAP machine every night. We will download data from your CPAP machine to assess its usage and effectiveness. Please schedule a follow-up appointment in six months to reassess your weight and consider a repeat sleep study if needed.  Download looks great, using 100%. Pressure 5-15cmh20. No residual apneas.  -POSTNASAL DRIP: Postnasal drip occurs when excess mucus from the nose drips down the back of the throat, causing a cough. Your persistent cough is likely due to postnasal drip, which may be worsened by seasonal pollen. Continue using Flonase  and add Astelin nasal spray to your regimen for two to four weeks. You can also take Mucinex  (guaifenesin ) one tablet twice a day as needed to help loosen mucus. Additionally, use the provided flutter valve to help loosen congestion. We will reassess your symptoms in two to four weeks and consider a chest CT if there is no improvement.  -PNEUMONIA: Pneumonia is an infection that inflames the air sacs in one or both lungs. Your recent chest x-ray shows that your pneumonia has resolved, and your current symptoms are due to postnasal drip rather than a recurrence of pneumonia. No immediate action is required for pneumonia as it has resolved.  INSTRUCTIONS: Please schedule a follow-up appointment in six months to reassess your weight and consider a repeat sleep study if needed. We will reassess your postnasal drip symptoms in two to four weeks; if there is no improvement, we may consider a chest CT.  Rx: Astelin nasal spray Mucinex  600mg  twice daily  Flutter valve 2-3 times a day  Follow-up: 6 month fu with Jerlene Moody NP

## 2024-02-09 NOTE — Progress Notes (Signed)
 @Patient  ID: Guy Moore, male    DOB: 04-14-1934, 89 y.o.   MRN: [id number]  No chief complaint on file.   Referring provider: Donley Furth, MD  HPI: 89 year old male, former smoker. PMH significant for afib, HTN, COPD with asthma, OSA. Former patient of Dr. Matilde Son, last seen by Dr. Diania Fortes in February 2025.  PSG 07/24/09 >> AHI 58, SpO2 low 74%   Previous [facility] pulmolnary encounter: 12/01/23- Dr. Jenness Mock Mentel is an 89 year old male, former smoker with atrial fibrillation, COPD/Asthma and OSA on CPAP who returns to pulmonary clinic for acute visit.   He has been experiencing a persistent cough for the past four weeks, which began with a cold that progressed to acute bronchitis. Initially, doxycycline  improved his symptoms, but the cough returned and is particularly bothersome when lying down. He describes the cough as more manageable when upright and notes clear or white sputum production along with post-nasal drip.  The cough significantly disrupts his sleep, and he uses a CPAP machine for sleep apnea. He has been losing weight, which may necessitate a repeat sleep study. His sleep was previously affected by prolonged catheter use following prostate surgery.  He uses a nebulizer every four hours, which provides some relief. He has not been using Flonase  recently, which he typically uses during allergy season. He experienced mild nausea while on doxycycline , which has persisted, and also reports a mild headache.  No current sore throat, fever, or chills. He notes post-nasal drip and clear or white sputum production, along with mild nausea and headache.  02/09/2024- interim hx  Discussed the use of AI scribe software for clinical note transcription with the patient, who gave verbal consent to proceed.  History of Present Illness   YU DEARMENT "Guy Moore" is an 89 year old male who presents with persistent cough and postnasal drip. History significant for COPD/asthma and OSA on  CPAP. Patient was seen by Dr. Diania Fortes in February for persistent cough due to postviral reactive airway disease. Bronchitis symptoms originally treated with course of doxycline. CXR showed patchy consolidative opacities RLL concerning for pneumonia. He was prescribed prednisone .    Currently, he experiences persistent coughing, particularly in the late afternoon and early evening, with clear sputum production. He has difficulty distinguishing between symptoms caused by pollen and those related to asthma. The cough sometimes occurs at night. CXR today indicates resolution of the opacity.  He uses Advair twice daily and Flonase  for his symptoms. The drainage seems to originate from his sinuses, as he does not cough when holding his breath after using Advair. He has not been taking any additional medication specifically for the cough.  No wheezing or chest tightness, but he experiences shortness of breath when carrying items upstairs. He continues to take Xarelto  as a blood thinner.  He uses a CPAP machine for sleep apnea every night. He has experienced weight loss, which he believes has improved his breathing. He mentions a past issue with the CPAP machine, which resolved after unplugging it for 24 hours.  No fever or colored mucus. No wheezing or chest tightness, except for mild shortness of breath when exerting himself, such as carrying items upstairs.      Airview download 01/09/2024 - 02/07/2024 Usage days 30/30 days (100%) greater than 4 hours Average usage 9 hours 20 minutes Pressure 5 to 15 cm H2O (11.8 cm H2O-95%) Air leak 7.7 L/min (95%) AHI 0.7    No Known Allergies  Immunization History  Administered Date(s) Administered   Fluad Quad(high Dose 65+) 06/21/2019, 07/10/2020, 07/22/2022   Fluad Trivalent(High Dose 65+) 07/06/2023   Influenza Whole 08/12/2006, 10/21/2007, 07/10/2008, 07/19/2009, 07/12/2012   Influenza, High Dose Seasonal PF 08/25/2018   Influenza, Seasonal, Injecte,  Preservative Fre 08/04/2013   Influenza-Unspecified 08/16/2015, 08/15/2016, 08/29/2017, 08/18/2021   PFIZER Comirnaty(Gray Top)Covid-19 Tri-Sucrose Vaccine 02/19/2021   PFIZER(Purple Top)SARS-COV-2 Vaccination 11/19/2019, 12/13/2019, 08/01/2020, 08/02/2020   Pfizer Covid-19 Vaccine Bivalent Booster 27yrs & up 08/18/2021   Pneumococcal Conjugate-13 01/17/2015   Pneumococcal Polysaccharide-23 04/20/1997, 03/14/2013   Td 10/09/2005   Unspecified SARS-COV-2 Vaccination 01/01/2024   Zoster, Live 03/19/2009    Past Medical History:  Diagnosis Date   Allergic rhinitis    Arthritis    Asthma    Atrial fibrillation (HCC)    sees Dr. Gearl Keens at [facility]    Atrial flutter (HCC)    BCC (basal cell carcinoma of skin)    Nose   Chronic kidney disease    Complication of anesthesia    no problems per patient   COPD with emphysema (HCC)    sees Dr. Wilder Handy    Diverticulosis    Dysrhythmia    afib, flutter and tachycardia   Erosive esophagitis    Esophageal stricture    Gastric ulcer    Hearing loss    uses amplification   Hemorrhoids    Hiatal hernia    History of kidney stones    HTN (hypertension)    Hyperlipidemia    Knee problem    2% permanent partial impairment Right   Macular degeneration    Perineal abscess    Pneumonia    Pre-diabetes    Presence of permanent cardiac pacemaker    Sleep apnea    cpap    Tobacco History: Social History   Tobacco Use  Smoking Status Former   Current packs/day: 0.00   Average packs/day: 1 pack/day for 28.0 years (28.0 ttl pk-yrs)   Types: Cigarettes   Start date: 10/12/1949   Quit date: 10/12/1977   Years since quitting: 46.3  Smokeless Tobacco Never   Counseling given: Not Answered   Outpatient Medications Prior to Visit  Medication Sig Dispense Refill   albuterol  (PROVENTIL ) (2.5 MG/3ML) 0.083% nebulizer solution Take 3 mLs (2.5 mg total) by nebulization every 4 (four) hours as needed for wheezing or  shortness of breath. 75 mL 5   albuterol  (VENTOLIN  HFA) 108 (90 Base) MCG/ACT inhaler Inhale 2 puffs into the lungs every 4 (four) hours as needed for wheezing or shortness of breath. 18 g 11   amLODipine  (NORVASC ) 5 MG tablet TAKE 1 TABLET DAILY 90 tablet 3   BESIVANCE 0.6 % SUSP Place 1 drop into both eyes See admin instructions. Instill 1 drop into both eyes 3 times daily on the day of monthly eye injections then 4 times daily the day after     Carboxymethylcellul-Glycerin (LUBRICATING EYE DROPS OP) Place 1 drop into both eyes daily as needed (dry eyes).     cyanocobalamin  (VITAMIN B12) 1000 MCG/ML injection Inject 1 mL (1,000 mcg total) into the muscle every 30 (thirty) days. 1 mL 0   doxycycline  (VIBRA -TABS) 100 MG tablet Take 1 tablet (100 mg total) by mouth 2 (two) times daily. 20 tablet 0   fluticasone  (FLONASE ) 50 MCG/ACT nasal spray Place 1 spray into both nostrils daily. (Patient taking differently: Place 1 spray into both nostrils daily as needed for allergies.) 16 g 2   fluticasone -salmeterol (ADVAIR) 250-50 MCG/ACT  AEPB Inhale 1 puff into the lungs in the morning and at bedtime. 180 each 1   hydrocortisone cream 1 % Apply 1 Application topically daily as needed for itching.     MAGNESIUM  GLUCONATE PO Take 1 tablet by mouth daily.     Methylcellulose, Laxative, (CITRUCEL PO) Take 1-2 tablets by mouth daily as needed (constipation).     Multiple Vitamins-Minerals (PRESERVISION AREDS 2) CAPS Take 1 capsule by mouth 2 (two) times daily.     neomycin -bacitracin -polymyxin (NEOSPORIN) OINT Apply 1 Application topically as needed for wound care.     omeprazole  (PRILOSEC) 40 MG capsule TAKE 1 CAPSULE DAILY (Patient taking differently: Take 40 mg by mouth daily as needed (heartburn).) 90 capsule 3   potassium chloride  SA (KLOR-CON  M) 20 MEQ tablet TAKE 1 TABLET DAILY 90 tablet 3   pravastatin  (PRAVACHOL ) 40 MG tablet TAKE 1 TABLET AT BEDTIME 90 tablet 3   valsartan  (DIOVAN ) 160 MG tablet TAKE 1  TABLET DAILY 90 tablet 3   XARELTO  20 MG TABS tablet TAKE 1 TABLET DAILY 90 tablet 3   No facility-administered medications prior to visit.   Review of Systems  Review of Systems  Constitutional: Negative.   HENT:  Positive for postnasal drip.   Respiratory:  Positive for cough.   Cardiovascular: Negative.    Physical Exam  There were no vitals taken for this visit. Physical Exam Constitutional:      Appearance: Normal appearance.  HENT:     Head: Normocephalic and atraumatic.  Cardiovascular:     Rate and Rhythm: Normal rate and regular rhythm.  Pulmonary:     Effort: Pulmonary effort is normal.     Breath sounds: Normal breath sounds. No wheezing or rhonchi.  Musculoskeletal:        General: Normal range of motion.  Skin:    General: Skin is warm and dry.  Neurological:     General: No focal deficit present.     Mental Status: He is alert and oriented to person, place, and time. Mental status is at baseline.  Psychiatric:        Mood and Affect: Mood normal.        Behavior: Behavior normal.        Thought Content: Thought content normal.        Judgment: Judgment normal.      Lab Results:  CBC    Component Value Date/Time   WBC 2.6 (L) 08/31/2023 1325   WBC 3.9 (L) 03/27/2023 0535   RBC 4.50 08/31/2023 1325   HGB 13.2 08/31/2023 1325   HCT 41.1 08/31/2023 1325   PLT 109 (L) 08/31/2023 1325   MCV 91.3 08/31/2023 1325   MCH 29.3 08/31/2023 1325   MCHC 32.1 08/31/2023 1325   RDW 17.5 (H) 08/31/2023 1325   LYMPHSABS 0.4 (L) 08/31/2023 1325   MONOABS 0.3 08/31/2023 1325   EOSABS 0.1 08/31/2023 1325   BASOSABS 0.0 08/31/2023 1325    BMET    Component Value Date/Time   NA 138 08/31/2023 1325   NA 141 06/27/2015 0000   K 4.5 08/31/2023 1325   CL 104 08/31/2023 1325   CO2 29 08/31/2023 1325   GLUCOSE 82 08/31/2023 1325   BUN 20 08/31/2023 1325   BUN 21 06/27/2015 0000   CREATININE 1.01 08/31/2023 1325   CALCIUM 8.9 08/31/2023 1325   GFRNONAA >60  08/31/2023 1325   GFRAA >60 02/26/2020 1329    BNP    Component Value Date/Time   BNP  161.2 (H) 05/25/2021 0258    ProBNP No results found for: "PROBNP"  Imaging: No results found.   Assessment & Plan:   No problem-specific Assessment & Plan notes found for this encounter.   Assessment and Plan    Obstructive sleep apnea Patient is 100% compliant with CPAP over the last 30 days > 4 hours. Current pressure 5-15cm h20 with residual AHI 0/7/hour, indicating good control. He reports improved breathing with weight loss and no longer experiences dry mouth upon waking. The CPAP machine had a temporary malfunction but is now functioning properly. He is compliant with CPAP use and is considering a repeat sleep study if weight loss continues and symptoms improve.  - Continue CPAP use nightly 4-6 hours.  - No changes recommended. - Schedule follow-up in six months to reassess weight and consider repeat sleep study if indicated.  Postnasal drip Postnasal drip is contributing to a persistent cough, particularly in the late afternoon and evening, with clear mucus production and nasal congestion, likely exacerbated by seasonal pollen. Current treatment includes Flonase , but additional measures are recommended. He is agreeable to using two nasal sprays concurrently for two to four weeks. - Add Astelin nasal spray to current regimen with Flonase . - Use Mucinex  (guaifenesin ) one tablet twice a day as needed to loosen mucus. - Provide a flutter valve for use as an exercise to help loosen congestion. - Reassess symptoms in two to four weeks; consider chest CT if no improvement.  Pneumonia Recent chest x-ray shows resolution of previous pneumonia with no remaining opacity. He was previously treated with doxycycline  and prednisone , which improved symptoms. Current symptoms are attributed to postnasal drip rather than pneumonia recurrence. - No immediate action required as pneumonia has  resolved.  COPD/Asthma - Stable, continue Advair 250-50mcg one puffs q 12 hours   Antonio Baumgarten, NP 02/09/2024

## 2024-02-10 ENCOUNTER — Telehealth: Payer: Self-pay | Admitting: Internal Medicine

## 2024-02-10 DIAGNOSIS — H6123 Impacted cerumen, bilateral: Secondary | ICD-10-CM | POA: Diagnosis not present

## 2024-02-10 NOTE — Telephone Encounter (Signed)
 Rescheduled appointments per provider in a meeting. Left the patient a voicemail with the changes made and will be mailed an appointment reminder.

## 2024-02-14 ENCOUNTER — Ambulatory Visit (INDEPENDENT_AMBULATORY_CARE_PROVIDER_SITE_OTHER): Payer: Medicare Other

## 2024-02-14 DIAGNOSIS — I4819 Other persistent atrial fibrillation: Secondary | ICD-10-CM

## 2024-02-15 DIAGNOSIS — L578 Other skin changes due to chronic exposure to nonionizing radiation: Secondary | ICD-10-CM | POA: Diagnosis not present

## 2024-02-15 DIAGNOSIS — L570 Actinic keratosis: Secondary | ICD-10-CM | POA: Diagnosis not present

## 2024-02-15 DIAGNOSIS — L821 Other seborrheic keratosis: Secondary | ICD-10-CM | POA: Diagnosis not present

## 2024-02-15 DIAGNOSIS — Z85828 Personal history of other malignant neoplasm of skin: Secondary | ICD-10-CM | POA: Diagnosis not present

## 2024-02-15 DIAGNOSIS — Z08 Encounter for follow-up examination after completed treatment for malignant neoplasm: Secondary | ICD-10-CM | POA: Diagnosis not present

## 2024-02-15 DIAGNOSIS — D225 Melanocytic nevi of trunk: Secondary | ICD-10-CM | POA: Diagnosis not present

## 2024-02-15 DIAGNOSIS — I872 Venous insufficiency (chronic) (peripheral): Secondary | ICD-10-CM | POA: Diagnosis not present

## 2024-02-15 DIAGNOSIS — L814 Other melanin hyperpigmentation: Secondary | ICD-10-CM | POA: Diagnosis not present

## 2024-02-15 DIAGNOSIS — L84 Corns and callosities: Secondary | ICD-10-CM | POA: Diagnosis not present

## 2024-02-15 LAB — CUP PACEART REMOTE DEVICE CHECK
Battery Remaining Longevity: 81 mo
Battery Voltage: 2.98 V
Brady Statistic AP VP Percent: 0 %
Brady Statistic AP VS Percent: 0 %
Brady Statistic AS VP Percent: 85.28 %
Brady Statistic AS VS Percent: 14.72 %
Brady Statistic RA Percent Paced: 0 %
Brady Statistic RV Percent Paced: 85.28 %
Date Time Interrogation Session: 20250504221438
Implantable Lead Connection Status: 753985
Implantable Lead Connection Status: 753985
Implantable Lead Implant Date: 20180124
Implantable Lead Implant Date: 20180124
Implantable Lead Location: 753859
Implantable Lead Location: 753860
Implantable Lead Model: 5076
Implantable Lead Model: 5076
Implantable Pulse Generator Implant Date: 20180124
Lead Channel Impedance Value: 304 Ohm
Lead Channel Impedance Value: 323 Ohm
Lead Channel Impedance Value: 380 Ohm
Lead Channel Impedance Value: 456 Ohm
Lead Channel Pacing Threshold Amplitude: 0.75 V
Lead Channel Pacing Threshold Pulse Width: 0.4 ms
Lead Channel Sensing Intrinsic Amplitude: 0.25 mV
Lead Channel Sensing Intrinsic Amplitude: 0.25 mV
Lead Channel Sensing Intrinsic Amplitude: 6.875 mV
Lead Channel Sensing Intrinsic Amplitude: 6.875 mV
Lead Channel Setting Pacing Amplitude: 1.5 V
Lead Channel Setting Pacing Pulse Width: 0.4 ms
Lead Channel Setting Sensing Sensitivity: 0.9 mV
Zone Setting Status: 755011

## 2024-02-21 ENCOUNTER — Encounter (INDEPENDENT_AMBULATORY_CARE_PROVIDER_SITE_OTHER): Admitting: Ophthalmology

## 2024-02-21 DIAGNOSIS — H353231 Exudative age-related macular degeneration, bilateral, with active choroidal neovascularization: Secondary | ICD-10-CM | POA: Diagnosis not present

## 2024-02-21 DIAGNOSIS — H43813 Vitreous degeneration, bilateral: Secondary | ICD-10-CM

## 2024-02-21 DIAGNOSIS — H35033 Hypertensive retinopathy, bilateral: Secondary | ICD-10-CM | POA: Diagnosis not present

## 2024-02-21 DIAGNOSIS — I10 Essential (primary) hypertension: Secondary | ICD-10-CM | POA: Diagnosis not present

## 2024-02-21 DIAGNOSIS — D3131 Benign neoplasm of right choroid: Secondary | ICD-10-CM

## 2024-02-22 ENCOUNTER — Encounter: Payer: Self-pay | Admitting: Cardiology

## 2024-02-22 ENCOUNTER — Ambulatory Visit: Admitting: Cardiology

## 2024-02-22 VITALS — BP 140/80 | HR 89 | Ht 70.0 in | Wt 219.0 lb

## 2024-02-22 DIAGNOSIS — D6869 Other thrombophilia: Secondary | ICD-10-CM | POA: Diagnosis not present

## 2024-02-22 DIAGNOSIS — I495 Sick sinus syndrome: Secondary | ICD-10-CM | POA: Insufficient documentation

## 2024-02-22 DIAGNOSIS — I10 Essential (primary) hypertension: Secondary | ICD-10-CM | POA: Insufficient documentation

## 2024-02-22 DIAGNOSIS — I4819 Other persistent atrial fibrillation: Secondary | ICD-10-CM | POA: Insufficient documentation

## 2024-02-22 NOTE — Progress Notes (Signed)
  Electrophysiology Office Note:   Date:  02/22/2024  ID:  Guy Moore, DOB Mar 16, 1934, MRN [id number]  Primary Cardiologist: None Primary Heart Failure: None Electrophysiologist: Blessed Cotham Cortland Ding, MD      History of Present Illness:   Guy Moore is a 89 y.o. male with h/o atrial fibrillation, hypertension, hyperlipidemia, sleep apnea, sick sinus syndrome seen today for routine electrophysiology followup.   Since last being seen in our clinic the patient reports doing well.  He has no chest pain or shortness of breath.  He is unaware of his atrial fibrillation.  He is happy with his control..  he denies chest pain, palpitations, dyspnea, PND, orthopnea, nausea, vomiting, dizziness, syncope, edema, weight gain, or early satiety.   Review of systems complete and found to be negative unless listed in HPI.      EP Information / Studies Reviewed:    EKG is ordered today. Personal review as below.  EKG Interpretation Date/Time:  Tuesday Feb 22 2024 16:12:23 EDT Ventricular Rate:  89 PR Interval:    QRS Duration:  104 QT Interval:  374 QTC Calculation: 455 R Axis:   -46  Text Interpretation: Atrial fibrillation with frequent ventricular-paced complexes and with premature ventricular or aberrantly conducted complexes Left anterior fascicular block ST & T wave abnormality, consider lateral ischemia When compared with ECG of 04-Nov-2022 16:15, Atrial fibrillation Confirmed by Allayah Raineri ([id number]) on 02/22/2024 4:20:37 PM   PPM Interrogation-  reviewed in detail today,  See PACEART report.  Device History: Medtronic Dual Chamber PPM implanted 11/04/2016 for Sinus Node Dysfunction  Risk Assessment/Calculations:    CHA2DS2-VASc Score = 3   This indicates a 3.2% annual risk of stroke. The patient's score is based upon: CHF History: 0 HTN History: 1 Diabetes History: 0 Stroke History: 0 Vascular Disease History: 0 Age Score: 2 Gender Score: 0            Physical Exam:    VS:  BP (!) 140/80 (BP Location: Right Arm, Patient Position: Sitting, Cuff Size: Large)   Pulse 89   Ht 5\' 10"  (1.778 m)   Wt 219 lb (99.3 kg)   SpO2 97%   BMI 31.42 kg/m    Wt Readings from Last 3 Encounters:  02/22/24 219 lb (99.3 kg)  02/09/24 231 lb 3.2 oz (104.9 kg)  01/19/24 222 lb (100.7 kg)     GEN: Well nourished, well developed in no acute distress NECK: No JVD; No carotid bruits CARDIAC: Irregular, no murmurs, rubs, gallops RESPIRATORY:  Clear to auscultation without rales, wheezing or rhonchi  ABDOMEN: Soft, non-tender, non-distended EXTREMITIES:  No edema; No deformity   ASSESSMENT AND PLAN:    SND s/p Medtronic PPM  Normal PPM function See Pace Art report Sensing, threshold, impedance within normal limits Programming appropriate No changes today  2.  Permanent atrial fibrillation: Well rate controlled.  3.  Hypertension: Mildly elevated but usually well-controlled  4.  Secondary hypercoagulable state: On Xarelto  for atrial fibrillation  Disposition:   Follow up with EP APP in 12 months  Signed, Simisola Sandles Cortland Ding, MD

## 2024-02-22 NOTE — Patient Instructions (Signed)
   Follow-Up: At [facility], you and your health needs are our priority.  As part of our continuing mission to provide you with exceptional heart care, our providers are all part of one team.  This team includes your primary Cardiologist (physician) and Advanced Practice Providers or APPs (Physician Assistants and Nurse Practitioners) who all work together to provide you with the care you need, when you need it.  Your next appointment:   12 month(s)  Provider:   You will see one of the following Advanced Practice Providers on your designated Care Team:   Mertha Abrahams, Kennard Pea [address]" [place], PA-C Suzann Riddle, NP Creighton Doffing, NP

## 2024-02-23 LAB — CUP PACEART INCLINIC DEVICE CHECK
Date Time Interrogation Session: 20250513182147
Implantable Lead Connection Status: 753985
Implantable Lead Connection Status: 753985
Implantable Lead Implant Date: 20180124
Implantable Lead Implant Date: 20180124
Implantable Lead Location: 753859
Implantable Lead Location: 753860
Implantable Lead Model: 5076
Implantable Lead Model: 5076
Implantable Pulse Generator Implant Date: 20180124

## 2024-02-24 ENCOUNTER — Ambulatory Visit: Payer: Self-pay | Admitting: Cardiology

## 2024-02-29 NOTE — Progress Notes (Signed)
 [facility] OFFICE PROGRESS NOTE  Donley Furth, MD [address] 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  Bone marrow biopsy and aspirate performed in November 2021 showed hypercellular marrow with trilineage hematopoiesis and minimal dyspoiesis.   PRIOR THERAPY: None   CURRENT THERAPY: Monthly vitamin B12 injections most recent on/30/25  INTERVAL HISTORY: Guy Moore 89 y.o. male returns to the clinic today for 60-month follow-up visit.  The patient was last seen by Dr. Marguerita Shih in November 2024.  The patient is followed for early MDS based on a bone marrow biopsy from November 2021. for which he receives B12 injections on a monthly basis at his PCP's office.   He also sees cardiology and pulmonary.   He has been receiving monthly B12 injections, although his B12 levels have not been checked recently. He has a history of low white blood cell count, which has shown slight improvement over the past six months. He experiences easy bruising, which has worsened over the past six months, and is on Xarelto . He is on this for cardiac reasons. He denies visible bleeding. He was having nosebleeds but they subsided with the cessation of nasal spray.   He has a history of frequent antibiotic use due to recurrent kidney infections and a benign prostate condition, which required a TURP procedure. He was on antibiotics almost constantly last year, which he believes contributed to his low white blood cell count. He had a catheter for six and a half months, affecting his bladder function, but this has since improved.  He experienced shingles, confirmed by biopsy, despite having had it before. He reports fatigue, particularly after eating breakfast, which consists of a boiled egg, fruit, and a muffin or crisp bread. He has been trying to lose weight gradually and has successfully reduced his weight from 270 pounds to around 220 pounds. He  uses a CPAP machine.  He has a history of pneumonia, treated with doxycycline , and has not had any recent infections or significant weight loss. He has been using nasal sprays and guaifenesin  for nasal congestion and cough, which have since resolved. No recent episodes of passing out or visible bleeding, except for some epistaxis related to nasal spray use.  He is here today for evaluation repeat blood work.   MEDICAL HISTORY: Past Medical History:  Diagnosis Date   Allergic rhinitis    Arthritis    Asthma    Atrial fibrillation [facility])    sees Dr. 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 Wilder Handy    Diverticulosis    Dysrhythmia    afib, flutter and tachycardia   Erosive esophagitis    Esophageal stricture    Gastric ulcer    Hearing loss    uses amplification   Hemorrhoids    Hiatal hernia    History of kidney stones    HTN (hypertension)    Hyperlipidemia    Knee problem    2% permanent partial impairment Right   Macular degeneration    Perineal abscess    Pneumonia    Pre-diabetes    Presence of permanent cardiac pacemaker    Sleep apnea    cpap  ALLERGIES:  has no known allergies.  MEDICATIONS:  Current Outpatient Medications  Medication Sig Dispense Refill   albuterol  (PROVENTIL ) (2.5 MG/3ML) 0.083% nebulizer solution Take 3 mLs (2.5 mg total) by nebulization every 4 (four) hours as needed for wheezing or shortness of breath. 75 mL 5   albuterol  (VENTOLIN  HFA) 108 (90 Base) MCG/ACT inhaler Inhale 2 puffs into the lungs every 4 (four) hours as needed for wheezing or shortness of breath. 18 g 11   amLODipine  (NORVASC ) 5 MG tablet TAKE 1 TABLET DAILY 90 tablet 3   azelastine  (ASTELIN ) 0.1 % nasal spray Place 1 spray into both nostrils 2 (two) times daily. Use in  each nostril as directed 30 mL 1   BESIVANCE 0.6 % SUSP Place 1 drop into both eyes See admin instructions. Instill 1 drop into both eyes 3 times daily on the day of monthly eye injections then 4 times daily the day after     Carboxymethylcellul-Glycerin (LUBRICATING EYE DROPS OP) Place 1 drop into both eyes daily as needed (dry eyes).     cyanocobalamin  (VITAMIN B12) 1000 MCG/ML injection Inject 1 mL (1,000 mcg total) into the muscle every 30 (thirty) days. 1 mL 0   fluticasone  (FLONASE ) 50 MCG/ACT nasal spray Place 1 spray into both nostrils daily. (Patient taking differently: Place 1 spray into both nostrils daily as needed for allergies.) 16 g 2   fluticasone -salmeterol (ADVAIR) 250-50 MCG/ACT AEPB Inhale 1 puff into the lungs in the morning and at bedtime. 180 each 1   guaiFENesin  (MUCINEX ) 600 MG 12 hr tablet Take 1 tablet (600 mg total) by mouth 2 (two) times daily as needed for cough or to loosen phlegm. 30 tablet 1   hydrocortisone cream 1 % Apply 1 Application topically daily as needed for itching.     MAGNESIUM  GLUCONATE PO Take 1 tablet by mouth daily.     Methylcellulose, Laxative, (CITRUCEL PO) Take 1-2 tablets by mouth daily as needed (constipation).     Multiple Vitamins-Minerals (PRESERVISION AREDS 2) CAPS Take 1 capsule by mouth 2 (two) times daily.     neomycin -bacitracin -polymyxin (NEOSPORIN) OINT Apply 1 Application topically as needed for wound care.     omeprazole  (PRILOSEC) 40 MG capsule TAKE 1 CAPSULE DAILY (Patient taking differently: Take 40 mg by mouth daily as needed (heartburn).) 90 capsule 3   potassium chloride  SA (KLOR-CON  M) 20 MEQ tablet TAKE 1 TABLET DAILY 90 tablet 3   pravastatin  (PRAVACHOL ) 40 MG tablet TAKE 1 TABLET AT BEDTIME 90 tablet 3   valsartan  (DIOVAN ) 160 MG tablet TAKE 1 TABLET DAILY 90 tablet 3   XARELTO  20 MG TABS tablet TAKE 1 TABLET DAILY 90 tablet 3   No current facility-administered medications for this visit.    SURGICAL HISTORY:  Past  Surgical History:  Procedure Laterality Date   CARDIAC ELECTROPHYSIOLOGY STUDY AND ABLATION     x 4 (sees Dr. Allana Ishikawa at [facility])   CO2 LASER APPLICATION N/A 03/26/2023   Procedure: CO2 LASER ABLATION OF PENIS;  Surgeon: Lahoma Pigg, MD;  Location: [facility];  Service: Urology;  Laterality: N/A;   CYST EXCISION PERINEAL     CYSTOSCOPY WITH URETEROSCOPY AND STENT PLACEMENT Right 10/29/2022   Procedure: CYSTOSCOPY WITH RETROGRADE WITH URETHERAL DILITATIONRIGHT DIAGNOSTIC  URETEROSCOPY AND STENT PLACEMENT;  Surgeon: Lahoma Pigg, MD;  Location: [facility];  Service: Urology;  Laterality: Right;   CYSTOSCOPY/URETEROSCOPY/HOLMIUM LASER/STENT PLACEMENT Right 11/27/2022   Procedure: CYSTOSCOPY/ RIGHT RETROGRADE PYELOGRAM/RIGHT HOLMIUM LASER/RIGHT STENT PLACEMENT/BASKET;  Surgeon: Lahoma Pigg, MD;  Location: [facility];  Service: Urology;  Laterality: Right;  60 MINUTES NEEDED FOR CASE   INSERT / REPLACE / REMOVE PACEMAKER     NOSE SURGERY     septoplasty   PENILE BIOPSY N/A 11/27/2022   Procedure: PENILE BIOPSY;  Surgeon: Lahoma Pigg, MD;  Location: [facility];  Service: Urology;  Laterality: N/A;   TRANSURETHRAL RESECTION OF PROSTATE N/A 03/26/2023   Procedure: BIPOLAR TRANSURETHRAL RESECTION OF THE PROSTATE (TURP);  Surgeon: Lahoma Pigg, MD;  Location: [facility];  Service: Urology;  Laterality: N/A;  90 MINUTES NEEDED FOR CASE   UVULOPALATOPHARYNGOPLASTY  06/13/1979   VASECTOMY  06/12/1969   w/complications    REVIEW OF SYSTEMS:   Review of Systems  Constitutional: Positive for fatigue. Negative for appetite change, chills, and fever.  HENT: Negative for mouth sores, nosebleeds, sore throat and trouble swallowing.   Eyes: Negative for eye problems and icterus.  Respiratory: Negative for cough, hemoptysis, shortness of breath and wheezing.   Cardiovascular: Negative for chest pain and leg swelling.  Gastrointestinal: Negative for abdominal pain, constipation, diarrhea, nausea and  vomiting.  Genitourinary: Negative for bladder incontinence, difficulty urinating, dysuria, frequency and hematuria.   Musculoskeletal: Negative for back pain, gait problem, neck pain and neck stiffness.  Skin: Negative for itching and rash.  Neurological: Negative for dizziness, extremity weakness, gait problem, headaches, light-headedness and seizures.  Hematological: Negative for adenopathy. Does not bleed easily. Positive for easy bruising.  Psychiatric/Behavioral: Negative for confusion, depression and sleep disturbance. The patient is not nervous/anxious.     PHYSICAL EXAMINATION:  There were no vitals taken for this visit.  ECOG PERFORMANCE STATUS: 1  Physical Exam  Constitutional: Oriented to person, place, and time and well-developed, well-nourished, and in no distress.  HENT:  Head: Normocephalic and atraumatic.  Mouth/Throat: Oropharynx is clear and moist. No oropharyngeal exudate.  Eyes: Conjunctivae are normal. Right eye exhibits no discharge. Left eye exhibits no discharge. No scleral icterus.  Neck: Normal range of motion. Neck supple.  Cardiovascular: Normal rate, regular rhythm, normal heart sounds and intact distal pulses.   Pulmonary/Chest: Effort normal and breath sounds normal. No respiratory distress. No wheezes. No rales.  Abdominal: Soft. Bowel sounds are normal. Exhibits no distension and no mass. There is no tenderness.  Musculoskeletal: Normal range of motion. Exhibits no edema.  Lymphadenopathy:    No cervical adenopathy.  Neurological: Alert and oriented to person, place, and time. Exhibits normal muscle tone. Gait normal. Coordination normal.  Skin: Skin is warm and dry. Positive for bruising on upper extremities (hands/forearms). No rash noted. Not diaphoretic. No erythema. No pallor.  Psychiatric: Mood, memory and judgment normal.  Vitals reviewed.  LABORATORY DATA: Lab Results  Component Value Date   WBC 2.6 (L) 08/31/2023   HGB 13.2 08/31/2023    HCT 41.1 08/31/2023   MCV 91.3 08/31/2023   PLT 109 (L) 08/31/2023      Chemistry      Component Value Date/Time   NA 138 08/31/2023 1325   NA 141 06/27/2015 0000   K 4.5 08/31/2023 1325   CL 104 08/31/2023 1325   CO2 29 08/31/2023 1325   BUN 20 08/31/2023 1325   BUN 21 06/27/2015 0000   CREATININE 1.01 08/31/2023 1325   GLU 121 06/27/2015 0000      Component Value Date/Time   CALCIUM 8.9 08/31/2023 1325   ALKPHOS 51 08/31/2023 1325   AST 21 08/31/2023 1325   ALT 19  08/31/2023 1325   BILITOT 0.7 08/31/2023 1325       RADIOGRAPHIC STUDIES:  CUP PACEART INCLINIC DEVICE CHECK Result Date: 02/23/2024 Normal in-clinic DUAL chamber pacemaker check. Presenting Rhythm: VS 73. Routine testing of thresholds, sensing, and impedance demonstrate stable parameters and no programming changes needed at this time. 2 short NSVT, duration <20 beats. Estimated longevity 6.7 years. Pt enrolled in remote follow-up.Juliann Ochoa, RN  CUP PACEART REMOTE DEVICE CHECK Result Date: 02/15/2024 PPM Scheduled remote reviewed. Normal device function.  Presenting rhythm:  Regular VP Next remote 91 days. LA, CVRS  DG Chest 2 View Result Date: 02/09/2024 CLINICAL DATA:  Pneumonia. EXAM: CHEST - 2 VIEW COMPARISON:  December 01, 2023. FINDINGS: Stable cardiomediastinal silhouette. Left-sided pacemaker is unchanged. Minimal left basilar subsegmental atelectasis or scarring is noted. Right infrahilar opacity noted on prior exam is not well visualized currently. Minimal right basilar subsegmental atelectasis is noted. Bony thorax is unremarkable. IMPRESSION: Minimal bibasilar subsegmental atelectasis. Right infrahilar opacity noted on prior exam is not well visualized currently. Electronically Signed   By: Rosalene Colon M.D.   On: 02/09/2024 10:43     ASSESSMENT/PLAN:  This is a very pleasant 89 year old Caucasian male with bicytopenia including leukocytopenia and thrombocytopenia likely secondary to iron  deficiency but early MDS could not be completely excluded. The patient had a bone marrow biopsy and aspirate performed recently that was nonspecific but again could not rule out early myelodysplastic syndrome. The patient is currently on observation except for the vitamin B12 injection and he is tolerating it well.    The patient had repeat CBC today which showed stable cytopenias.  His white blood cell count is still low but slightly improved compared to his last appointment at 3.1.  His hemoglobin has slightly low at 12.6.  His platelet count is stable but continues to be lower than the reference range of 117.    He will continue with his B12 injections monthly at his PCP's office.  He does bruise easily which may be due to age and blood thinner. He is on blood thinner for cardiac concerns.   Anemia, unspecified Anemia with red blood cells slightly below normal range. Evaluating for B12 and iron deficiencies as potential contributors. - Check B12 and iron levels. - Review lab results and communicate findings via MyChart or phone call.  He reports compression stockings were recommended previously for venous stasis dermatitis. He is wondering where to purchase this. This can be purchased from a medical supply store.   We will see him in 6 months for evaluation and repeat blood work.   The patient was advised to call immediately if he has any concerning symptoms in the interval. The patient voices understanding of current disease status and treatment options and is in agreement with the current care plan. All questions were answered. The patient knows to call the clinic with any problems, questions or concerns. We can certainly see the patient much sooner if necessary     No orders of the defined types were placed in this encounter.   The total time spent in the appointment was 20-29 minutes  Jersi Mcmaster L Izabel Chim, PA-C 02/29/24

## 2024-03-02 ENCOUNTER — Ambulatory Visit: Admitting: Internal Medicine

## 2024-03-02 ENCOUNTER — Ambulatory Visit: Payer: Medicare Other | Admitting: Internal Medicine

## 2024-03-02 ENCOUNTER — Other Ambulatory Visit: Payer: Self-pay

## 2024-03-02 ENCOUNTER — Inpatient Hospital Stay: Attending: Physician Assistant

## 2024-03-02 ENCOUNTER — Other Ambulatory Visit: Payer: Self-pay | Admitting: Physician Assistant

## 2024-03-02 ENCOUNTER — Other Ambulatory Visit: Payer: Medicare Other

## 2024-03-02 ENCOUNTER — Inpatient Hospital Stay (HOSPITAL_BASED_OUTPATIENT_CLINIC_OR_DEPARTMENT_OTHER): Admitting: Physician Assistant

## 2024-03-02 VITALS — BP 138/77 | HR 86 | Temp 97.5°F | Resp 18 | Wt 228.1 lb

## 2024-03-02 DIAGNOSIS — D649 Anemia, unspecified: Secondary | ICD-10-CM

## 2024-03-02 DIAGNOSIS — D696 Thrombocytopenia, unspecified: Secondary | ICD-10-CM | POA: Diagnosis not present

## 2024-03-02 DIAGNOSIS — D72819 Decreased white blood cell count, unspecified: Secondary | ICD-10-CM | POA: Diagnosis not present

## 2024-03-02 DIAGNOSIS — E538 Deficiency of other specified B group vitamins: Secondary | ICD-10-CM | POA: Diagnosis not present

## 2024-03-02 LAB — IRON AND IRON BINDING CAPACITY (CC-WL,HP ONLY)
Iron: 101 ug/dL (ref 45–182)
Saturation Ratios: 23 % (ref 17.9–39.5)
TIBC: 448 ug/dL (ref 250–450)
UIBC: 347 ug/dL (ref 117–376)

## 2024-03-02 LAB — CBC WITH DIFFERENTIAL (CANCER CENTER ONLY)
Abs Immature Granulocytes: 0.01 10*3/uL (ref 0.00–0.07)
Basophils Absolute: 0 10*3/uL (ref 0.0–0.1)
Basophils Relative: 1 %
Eosinophils Absolute: 0.1 10*3/uL (ref 0.0–0.5)
Eosinophils Relative: 3 %
HCT: 37.8 % — ABNORMAL LOW (ref 39.0–52.0)
Hemoglobin: 12.6 g/dL — ABNORMAL LOW (ref 13.0–17.0)
Immature Granulocytes: 0 %
Lymphocytes Relative: 10 %
Lymphs Abs: 0.3 10*3/uL — ABNORMAL LOW (ref 0.7–4.0)
MCH: 29.4 pg (ref 26.0–34.0)
MCHC: 33.3 g/dL (ref 30.0–36.0)
MCV: 88.1 fL (ref 80.0–100.0)
Monocytes Absolute: 0.2 10*3/uL (ref 0.1–1.0)
Monocytes Relative: 7 %
Neutro Abs: 2.4 10*3/uL (ref 1.7–7.7)
Neutrophils Relative %: 79 %
Platelet Count: 117 10*3/uL — ABNORMAL LOW (ref 150–400)
RBC: 4.29 MIL/uL (ref 4.22–5.81)
RDW: 17.4 % — ABNORMAL HIGH (ref 11.5–15.5)
WBC Count: 3.1 10*3/uL — ABNORMAL LOW (ref 4.0–10.5)
nRBC: 0 % (ref 0.0–0.2)

## 2024-03-02 LAB — CMP (CANCER CENTER ONLY)
ALT: 21 U/L (ref 0–44)
AST: 20 U/L (ref 15–41)
Albumin: 4.2 g/dL (ref 3.5–5.0)
Alkaline Phosphatase: 53 U/L (ref 38–126)
Anion gap: 6 (ref 5–15)
BUN: 21 mg/dL (ref 8–23)
CO2: 29 mmol/L (ref 22–32)
Calcium: 9 mg/dL (ref 8.9–10.3)
Chloride: 104 mmol/L (ref 98–111)
Creatinine: 1.07 mg/dL (ref 0.61–1.24)
GFR, Estimated: >60 mL/min (ref >60)
Glucose, Bld: 141 mg/dL — ABNORMAL HIGH (ref 70–99)
Potassium: 4.5 mmol/L (ref 3.5–5.1)
Sodium: 139 mmol/L (ref 135–145)
Total Bilirubin: 0.8 mg/dL (ref 0.0–1.2)
Total Protein: 6.4 g/dL — ABNORMAL LOW (ref 6.5–8.1)

## 2024-03-02 LAB — VITAMIN B12: Vitamin B-12: 620 pg/mL (ref 180–914)

## 2024-03-02 LAB — LACTATE DEHYDROGENASE: LDH: 127 U/L (ref 98–192)

## 2024-03-03 ENCOUNTER — Encounter: Payer: Self-pay | Admitting: Physician Assistant

## 2024-03-03 LAB — FERRITIN: Ferritin: 40 ng/mL (ref 24–336)

## 2024-03-07 ENCOUNTER — Other Ambulatory Visit: Payer: Self-pay | Admitting: Family Medicine

## 2024-03-16 ENCOUNTER — Ambulatory Visit: Admitting: Adult Health

## 2024-03-16 ENCOUNTER — Encounter: Payer: Self-pay | Admitting: Adult Health

## 2024-03-16 VITALS — BP 120/78 | HR 76 | Temp 97.7°F | Ht 70.0 in | Wt 225.0 lb

## 2024-03-16 DIAGNOSIS — E538 Deficiency of other specified B group vitamins: Secondary | ICD-10-CM

## 2024-03-16 DIAGNOSIS — Z23 Encounter for immunization: Secondary | ICD-10-CM

## 2024-03-16 DIAGNOSIS — S61412A Laceration without foreign body of left hand, initial encounter: Secondary | ICD-10-CM | POA: Diagnosis not present

## 2024-03-16 MED ORDER — CYANOCOBALAMIN 1000 MCG/ML IJ SOLN
1000.0000 ug | Freq: Once | INTRAMUSCULAR | Status: AC
  Administered 2024-03-16: 1000 ug via INTRAMUSCULAR

## 2024-03-16 NOTE — Progress Notes (Signed)
 Subjective:    Patient ID: Guy Moore, male    DOB: 1933/12/21, 89 y.o.   MRN: [id number]  HPI 89 year old male who  has a past medical history of Allergic rhinitis, Arthritis, Asthma, Atrial fibrillation (HCC), Atrial flutter (HCC), BCC (basal cell carcinoma of skin), Chronic kidney disease, Complication of anesthesia, COPD with emphysema (HCC), Diverticulosis, Dysrhythmia, Erosive esophagitis, Esophageal stricture, Gastric ulcer, Hearing loss, Hemorrhoids, Hiatal hernia, History of kidney stones, HTN (hypertension), Hyperlipidemia, Knee problem, Macular degeneration, Perineal abscess, Pneumonia, Pre-diabetes, Presence of permanent cardiac pacemaker, and Sleep apnea.  He presents to the office today for an acute issue.  He reports yesterday he was helping his wife make up a bed and in the process accidentally scraped the dorsal aspect of his left hand against the headboard causing a skin tear.  He is on Xarelto  20 mg and reports significant bleeding after injuring his hand.  He reports that he wrapped his hand with a nonstick dressing and what sounds like Coban to help stop the bleeding.  He had difficulty initially stopping the bleed but was able to do so.  After bandaging his hand he noticed swelling in his fingers and forearm.  Took the bandage off to let the wound dry and the swelling and pain dissipated.  Today he reports that he has not had any further bleeding.  Does have some localized discomfort around the skin tear.  He would like to make sure that he does not have any type of infection.  He also has a history of B12 deficiency and gets vitamin B12 injections. His last was in April. He would like to do his injection today    Review of Systems See HPI   Past Medical History:  Diagnosis Date   Allergic rhinitis    Arthritis    Asthma    Atrial fibrillation [facility])    sees Dr. Gearl Keens at [facility]    Atrial flutter (HCC)    BCC (basal cell carcinoma of skin)     Nose   Chronic kidney disease    Complication of anesthesia    no problems per patient   COPD with emphysema (HCC)    sees Dr. Wilder Handy    Diverticulosis    Dysrhythmia    afib, flutter and tachycardia   Erosive esophagitis    Esophageal stricture    Gastric ulcer    Hearing loss    uses amplification   Hemorrhoids    Hiatal hernia    History of kidney stones    HTN (hypertension)    Hyperlipidemia    Knee problem    2% permanent partial impairment Right   Macular degeneration    Perineal abscess    Pneumonia    Pre-diabetes    Presence of permanent cardiac pacemaker    Sleep apnea    cpap    Social History   Socioeconomic History   Marital status: Married    Spouse name: Not on file   Number of children: 2   Years of education: Not on file   Highest education level: Master's degree (e.g., MA, MS, MEng, MEd, MSW, MBA)  Occupational History   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   Occupation: Med Engineer, manufacturing   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 He does not want heroic or futile care especially if he cannon communicate, read and enjoy his loved ones.     Social Drivers of Corporate investment banker Strain: Low Risk  (01/19/2024)   Overall Financial Resource Strain (CARDIA)    Difficulty of Paying Living Expenses: Not hard at all  Food  Insecurity: Low Risk  (02/10/2024)   Received from [facility]   Hunger Vital Sign    Worried About Running Out of Food in the Last Year: Never true    Ran Out of Food in the Last Year: Never true  Transportation Needs: No Transportation Needs (02/10/2024)   Received from [facility]    In the past 12 months, has lack of reliable transportation kept you from medical appointments, meetings, work or from getting things needed for daily living? : No  Physical Activity: Sufficiently Active (01/19/2024)   Exercise Vital Sign    Days of Exercise per Week: 7 days    Minutes of Exercise per Session: 30 min  Stress: No Stress Concern Present (01/19/2024)   [employer] of Occupational Health - Occupational Stress Questionnaire    Feeling of Stress : Not at all  Social Connections: Socially Integrated (01/19/2024)   Social Connection and Isolation Panel [NHANES]    Frequency of Communication with Friends and Family: More than three times a week    Frequency of Social Gatherings with Friends and Family: More than three times a week    Attends Religious Services: More than 4 times per year    Active Member of [employer] or Organizations: Yes    Attends Banker Meetings: More than 4 times per year    Marital Status: Married  Catering manager Violence: Not At Risk (01/19/2024)   Humiliation, Afraid, Rape, and Kick questionnaire    Fear of Current or Ex-Partner: No    Emotionally Abused: No    Physically Abused: No    Sexually Abused: No    Past Surgical History:  Procedure Laterality Date   CARDIAC ELECTROPHYSIOLOGY STUDY AND ABLATION     x 4 (sees Dr. Allana Ishikawa at [facility])   CO2 LASER APPLICATION N/A 03/26/2023   Procedure: CO2 LASER ABLATION OF PENIS;  Surgeon: Lahoma Pigg, MD;  Location: [facility];  Service: Urology;  Laterality: N/A;   CYST EXCISION PERINEAL     CYSTOSCOPY WITH URETEROSCOPY AND STENT PLACEMENT Right 10/29/2022   Procedure: CYSTOSCOPY WITH  RETROGRADE WITH URETHERAL DILITATIONRIGHT DIAGNOSTIC  URETEROSCOPY AND STENT PLACEMENT;  Surgeon: Lahoma Pigg, MD;  Location: [facility];  Service: Urology;  Laterality: Right;   CYSTOSCOPY/URETEROSCOPY/HOLMIUM LASER/STENT PLACEMENT Right 11/27/2022   Procedure: CYSTOSCOPY/ RIGHT RETROGRADE PYELOGRAM/RIGHT HOLMIUM LASER/RIGHT STENT PLACEMENT/BASKET;  Surgeon: Lahoma Pigg, MD;  Location: [facility];  Service: Urology;  Laterality: Right;  60 MINUTES NEEDED FOR CASE   INSERT / REPLACE / REMOVE PACEMAKER     NOSE SURGERY     septoplasty  PENILE BIOPSY N/A 11/27/2022   Procedure: PENILE BIOPSY;  Surgeon: Lahoma Pigg, MD;  Location: [facility];  Service: Urology;  Laterality: N/A;   TRANSURETHRAL RESECTION OF PROSTATE N/A 03/26/2023   Procedure: BIPOLAR TRANSURETHRAL RESECTION OF THE PROSTATE (TURP);  Surgeon: Lahoma Pigg, MD;  Location: [facility];  Service: Urology;  Laterality: N/A;  90 MINUTES NEEDED FOR CASE   UVULOPALATOPHARYNGOPLASTY  06/13/1979   VASECTOMY  06/12/1969   w/complications    Family History  Problem Relation Age of Onset   Stroke Mother    Breast cancer Mother    Heart attack Mother    Other Mother        brain tunor   Heart disease Father    Hypertension Father    Bladder Cancer Father    Parkinson's disease Father    Stroke Father    Breast cancer Brother    Atrial fibrillation Brother    Tremor Brother    Stroke Brother    Hypertension Brother    Breast cancer Maternal Grandmother    Heart disease Maternal Grandmother    Heart disease Maternal Grandfather    Arthritis Daughter    Heart disease Daughter    Colitis Daughter        Lymphocytic   Colon cancer Neg Hx     No Known Allergies  Current Outpatient Medications on File Prior to Visit  Medication Sig Dispense Refill   albuterol  (PROVENTIL ) (2.5 MG/3ML) 0.083% nebulizer solution Take 3 mLs (2.5 mg total) by nebulization every 4 (four) hours as needed for wheezing or shortness of breath. 75 mL 5    albuterol  (VENTOLIN  HFA) 108 (90 Base) MCG/ACT inhaler Inhale 2 puffs into the lungs every 4 (four) hours as needed for wheezing or shortness of breath. 18 g 11   amLODipine  (NORVASC ) 5 MG tablet TAKE 1 TABLET DAILY 90 tablet 3   azelastine  (ASTELIN ) 0.1 % nasal spray Place 1 spray into both nostrils 2 (two) times daily. Use in each nostril as directed 30 mL 1   BESIVANCE 0.6 % SUSP Place 1 drop into both eyes See admin instructions. Instill 1 drop into both eyes 3 times daily on the day of monthly eye injections then 4 times daily the day after     Carboxymethylcellul-Glycerin (LUBRICATING EYE DROPS OP) Place 1 drop into both eyes daily as needed (dry eyes).     cyanocobalamin  (VITAMIN B12) 1000 MCG/ML injection Inject 1 mL (1,000 mcg total) into the muscle every 30 (thirty) days. 1 mL 0   fluticasone  (FLONASE ) 50 MCG/ACT nasal spray Place 1 spray into both nostrils daily. (Patient taking differently: Place 1 spray into both nostrils daily as needed for allergies.) 16 g 2   fluticasone -salmeterol (ADVAIR) 250-50 MCG/ACT AEPB Inhale 1 puff into the lungs in the morning and at bedtime. 180 each 1   guaiFENesin  (MUCINEX ) 600 MG 12 hr tablet Take 1 tablet (600 mg total) by mouth 2 (two) times daily as needed for cough or to loosen phlegm. 30 tablet 1   hydrocortisone cream 1 % Apply 1 Application topically daily as needed for itching.     MAGNESIUM  GLUCONATE PO Take 1 tablet by mouth daily.     Methylcellulose, Laxative, (CITRUCEL PO) Take 1-2 tablets by mouth daily as needed (constipation).     Multiple Vitamins-Minerals (PRESERVISION AREDS 2) CAPS Take 1 capsule by mouth 2 (two) times daily.     neomycin -bacitracin -polymyxin (NEOSPORIN) OINT Apply 1 Application topically as needed for wound care.  omeprazole  (PRILOSEC) 40 MG capsule TAKE 1 CAPSULE DAILY (Patient taking differently: Take 40 mg by mouth daily as needed (heartburn).) 90 capsule 3   potassium chloride  SA (KLOR-CON  M) 20 MEQ tablet TAKE  1 TABLET DAILY 90 tablet 3   pravastatin  (PRAVACHOL ) 40 MG tablet TAKE 1 TABLET AT BEDTIME 90 tablet 3   valsartan  (DIOVAN ) 160 MG tablet TAKE 1 TABLET DAILY 90 tablet 3   XARELTO  20 MG TABS tablet TAKE 1 TABLET DAILY 90 tablet 3   No current facility-administered medications on file prior to visit.    BP 120/78   Pulse 76   Temp 97.7 F (36.5 C) (Oral)   Ht 5\' 10"  (1.778 m)   Wt 225 lb (102.1 kg)   SpO2 97%   BMI 32.28 kg/m       Objective:   Physical Exam Constitutional:      Appearance: Normal appearance.  Skin:    General: Skin is warm and dry.     Capillary Refill: Capillary refill takes less than 2 seconds.     Findings: Ecchymosis and laceration present.     Comments: 1.5 cm ski tear noted on dorsal aspect of left hand. No active bleeding. No signs of infection. He has localized ecchymosis noted around skin tear   Neurological:     General: No focal deficit present.     Mental Status: He is alert and oriented to person, place, and time.  Psychiatric:        Mood and Affect: Mood normal.        Behavior: Behavior normal.        Thought Content: Thought content normal.        Judgment: Judgment normal.        Assessment & Plan:  1. Skin tear of left hand without complication, initial encounter (Primary) - Signs of infection noted.  Encouraged to use nonstick dressing and Kerlix. Likely swelling was from using Coban. He can apply antibacterial ointment. Keep wound clean. Will update his TDAP today  - Follow up with signs of infection  - Tdap vaccine greater than or equal to 7yo IM   2. B12 deficiency  - cyanocobalamin  (VITAMIN B12) injection 1,000 mcg   Bobbi Yount, NP  Time spent with patient today was 31 minutes which consisted of chart review, discussing skin tear and need for TDAP booster work up, treatment, listening,  answering questions and documentation.

## 2024-03-30 DIAGNOSIS — D074 Carcinoma in situ of penis: Secondary | ICD-10-CM | POA: Diagnosis not present

## 2024-03-30 DIAGNOSIS — N401 Enlarged prostate with lower urinary tract symptoms: Secondary | ICD-10-CM | POA: Diagnosis not present

## 2024-03-30 DIAGNOSIS — R3915 Urgency of urination: Secondary | ICD-10-CM | POA: Diagnosis not present

## 2024-03-30 DIAGNOSIS — N200 Calculus of kidney: Secondary | ICD-10-CM | POA: Diagnosis not present

## 2024-04-03 ENCOUNTER — Encounter (INDEPENDENT_AMBULATORY_CARE_PROVIDER_SITE_OTHER): Admitting: Ophthalmology

## 2024-04-03 DIAGNOSIS — H35033 Hypertensive retinopathy, bilateral: Secondary | ICD-10-CM | POA: Diagnosis not present

## 2024-04-03 DIAGNOSIS — H353231 Exudative age-related macular degeneration, bilateral, with active choroidal neovascularization: Secondary | ICD-10-CM | POA: Diagnosis not present

## 2024-04-03 DIAGNOSIS — I10 Essential (primary) hypertension: Secondary | ICD-10-CM

## 2024-04-03 DIAGNOSIS — H43813 Vitreous degeneration, bilateral: Secondary | ICD-10-CM

## 2024-04-03 DIAGNOSIS — D3131 Benign neoplasm of right choroid: Secondary | ICD-10-CM | POA: Diagnosis not present

## 2024-04-03 NOTE — Progress Notes (Signed)
 Remote pacemaker transmission.

## 2024-04-03 NOTE — Addendum Note (Signed)
 Addended by: TAWNI DRILLING D on: 04/03/2024 04:38 PM   Modules accepted: Orders

## 2024-04-04 ENCOUNTER — Ambulatory Visit (INDEPENDENT_AMBULATORY_CARE_PROVIDER_SITE_OTHER): Admitting: Family Medicine

## 2024-04-04 ENCOUNTER — Other Ambulatory Visit (INDEPENDENT_AMBULATORY_CARE_PROVIDER_SITE_OTHER): Payer: Self-pay | Admitting: Family Medicine

## 2024-04-04 VITALS — BP 110/78 | HR 72 | Temp 97.9°F | Wt 227.0 lb

## 2024-04-04 DIAGNOSIS — M48061 Spinal stenosis, lumbar region without neurogenic claudication: Secondary | ICD-10-CM | POA: Diagnosis not present

## 2024-04-04 MED ORDER — TRAMADOL HCL 50 MG PO TABS: 100.0000 mg | ORAL_TABLET | Freq: Four times a day (QID) | ORAL | 2 refills | Status: DC | PRN

## 2024-04-04 MED ORDER — CYCLOBENZAPRINE HCL 10 MG PO TABS: 10.0000 mg | ORAL_TABLET | Freq: Three times a day (TID) | ORAL | 5 refills | Status: DC | PRN

## 2024-04-04 NOTE — Progress Notes (Signed)
   Subjective:    Patient ID: Guy Moore, male    DOB: October 10, 1934, 89 y.o.   MRN: [id number]  HPI See my other note.    Review of Systems     Objective:   Physical Exam        Assessment & Plan:

## 2024-04-04 NOTE — Progress Notes (Signed)
   Subjective:    Patient ID: Guy Moore, male    DOB: [birth year], 89 y.o.   MRN: [id number]  HPI Here for worsening low back pain. He has known lumbar spinal stenosis, and he has been taking Tylenol  for the pain. This no longer helps much.    Review of Systems  Constitutional: Negative.   Respiratory: Negative.    Cardiovascular: Negative.   Musculoskeletal:  Positive for back pain.       Objective:   Physical Exam Constitutional:      Appearance: Normal appearance.     Comments: He has pain getting up or down from a chair    Cardiovascular:     Rate and Rhythm: Normal rate and regular rhythm.     Pulses: Normal pulses.     Heart sounds: Normal heart sounds.  Pulmonary:     Effort: Pulmonary effort is normal.     Breath sounds: Normal breath sounds.   Musculoskeletal:     Comments: He is tender in the lower back, and there is a lot of spasm. ROM is limited by pain   Neurological:     Mental Status: He is alert.           Assessment & Plan:  Low back pain. He can try Tramadol  and Flexeril as needed. Garnette Olmsted, MD

## 2024-04-06 ENCOUNTER — Telehealth: Payer: Self-pay | Admitting: Cardiology

## 2024-04-06 NOTE — Telephone Encounter (Signed)
 Spoke with pt, aware okay to take flexeril.

## 2024-04-06 NOTE — Telephone Encounter (Signed)
 Pt c/o medication issue:  1. Name of Medication:   cyclobenzaprine (FLEXERIL) 10 MG tablet    2. How are you currently taking this medication (dosage and times per day)?  Take 1 tablet (10 mg total) by mouth 3 (three) times daily as needed for muscle spasms.       3. Are you having a reaction (difficulty breathing--STAT)? No  4. What is your medication issue? 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 Please advise

## 2024-04-13 ENCOUNTER — Other Ambulatory Visit: Payer: Self-pay

## 2024-04-13 ENCOUNTER — Emergency Department (HOSPITAL_BASED_OUTPATIENT_CLINIC_OR_DEPARTMENT_OTHER)
Admission: EM | Admit: 2024-04-13 | Discharge: 2024-04-13 | Discharge status: Against Medical Advice | Attending: Emergency Medicine | Admitting: Emergency Medicine

## 2024-04-13 DIAGNOSIS — T169XXA Foreign body in ear, unspecified ear, initial encounter: Secondary | ICD-10-CM | POA: Insufficient documentation

## 2024-04-13 DIAGNOSIS — Z5321 Procedure and treatment not carried out due to patient leaving prior to being seen by health care provider: Secondary | ICD-10-CM | POA: Insufficient documentation

## 2024-04-13 DIAGNOSIS — X58XXXA Exposure to other specified factors, initial encounter: Secondary | ICD-10-CM | POA: Insufficient documentation

## 2024-04-13 NOTE — ED Triage Notes (Signed)
 Pt reports he was taking out his hearing aid this afternoon and the end of the hearing aid got stuck inside his ear. No visible in triage. Reports its starting to feel uncomfortable.

## 2024-04-14 ENCOUNTER — Encounter (HOSPITAL_COMMUNITY): Payer: Self-pay

## 2024-04-14 ENCOUNTER — Ambulatory Visit (HOSPITAL_COMMUNITY): Admission: EM | Admit: 2024-04-14 | Discharge: 2024-04-14 | Disposition: A | Discharge status: Home / Self Care

## 2024-04-14 DIAGNOSIS — T162XXA Foreign body in left ear, initial encounter: Secondary | ICD-10-CM | POA: Diagnosis not present

## 2024-04-14 NOTE — ED Provider Notes (Signed)
 [facility]    CSN: [id number] Arrival date & time: 04/14/24  9071      History   Chief Complaint Chief Complaint  Patient presents with   Foreign Body in Ear    HPI Guy Moore is a 89 y.o. male.   Patient with part of his hearing aid stuck in his left ear.  Patient states he was trying to take out his hearing aid yesterday when the dome part of the hearing aid came off and was changed in his ear.  Patient states that he did try to remove this, but was unable to.  Patient denies any pain or drainage from the ear.  The history is provided by the patient and medical records.  Foreign Body in Ear    Past Medical History:  Diagnosis Date   Allergic rhinitis    Arthritis    Asthma    Atrial fibrillation (HCC)    sees Dr. 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 Carolynne Allan    Diverticulosis    Dysrhythmia    afib, flutter and tachycardia   Erosive esophagitis    Esophageal stricture    Gastric ulcer    Hearing loss    uses amplification   Hemorrhoids    Hiatal hernia    History of kidney stones    HTN (hypertension)    Hyperlipidemia    Knee problem    2% permanent partial impairment Right   Macular degeneration    Perineal abscess    Pneumonia    Pre-diabetes    Presence of permanent cardiac pacemaker    Sleep apnea    cpap    Patient Active Problem List   Diagnosis Date Noted   Lumbar spinal stenosis 04/04/2024   BPH (benign prostatic hyperplasia) 03/26/2023   Urinary retention 11/04/2022   Ureteral calculus, right 11/04/2022   History of UTI 11/04/2022   Hydronephrosis 11/04/2022   Perineal abscess 08/04/2021   CAP (community acquired pneumonia) 05/25/2021   Dyspnea 05/25/2021   Bicytopenia 08/06/2020   B12 deficiency 05/06/2020   COPD  with asthma (HCC) 03/08/2018   Asthma 08/26/2016   Hyperglycemia 05/29/2016   Thoracic scoliosis 12/16/2015   Thrombocytopenia (HCC) 04/12/2015   History of basal cell cancer 06/27/2014   GERD (gastroesophageal reflux disease) 08/31/2011   Anticoagulant long-term use 08/31/2011   OBSTRUCTIVE SLEEP APNEA 07/04/2009   Atrial fibrillation (HCC) 04/19/2009   Allergic rhinitis 04/14/2007   Hyperlipidemia 04/13/2007   Essential hypertension 04/13/2007    Past Surgical History:  Procedure Laterality Date   CARDIAC ELECTROPHYSIOLOGY STUDY AND ABLATION     x 4 (sees Dr. Belvie Linger at [facility])   CO2 LASER APPLICATION N/A 03/26/2023   Procedure: CO2 LASER ABLATION OF PENIS;  Surgeon: Selma Donnice SAUNDERS, MD;  Location: [facility];  Service: Urology;  Laterality: N/A;   CYST EXCISION PERINEAL     CYSTOSCOPY WITH URETEROSCOPY AND STENT PLACEMENT Right 10/29/2022   Procedure: CYSTOSCOPY WITH RETROGRADE WITH URETHERAL DILITATIONRIGHT DIAGNOSTIC  URETEROSCOPY AND STENT PLACEMENT;  Surgeon: Selma Donnice SAUNDERS, MD;  Location: [facility];  Service: Urology;  Laterality: Right;   CYSTOSCOPY/URETEROSCOPY/HOLMIUM LASER/STENT PLACEMENT Right 11/27/2022   Procedure: CYSTOSCOPY/ RIGHT RETROGRADE PYELOGRAM/RIGHT HOLMIUM LASER/RIGHT STENT  PLACEMENT/BASKET;  Surgeon: Selma Donnice SAUNDERS, MD;  Location: [facility];  Service: Urology;  Laterality: Right;  60 MINUTES NEEDED FOR CASE   INSERT / REPLACE / REMOVE PACEMAKER     NOSE SURGERY     septoplasty   PENILE BIOPSY N/A 11/27/2022   Procedure: PENILE BIOPSY;  Surgeon: Selma Donnice SAUNDERS, MD;  Location: [facility];  Service: Urology;  Laterality: N/A;   TRANSURETHRAL RESECTION OF PROSTATE N/A 03/26/2023   Procedure: BIPOLAR TRANSURETHRAL RESECTION OF THE PROSTATE (TURP);  Surgeon: Selma Donnice SAUNDERS, MD;  Location: [facility];  Service: Urology;  Laterality: N/A;  90 MINUTES NEEDED FOR CASE   UVULOPALATOPHARYNGOPLASTY  06/13/1979   VASECTOMY  06/12/1969   w/complications       Home  Medications    Prior to Admission medications   Medication Sig Start Date End Date Taking? Authorizing Provider  albuterol  (PROVENTIL ) (2.5 MG/3ML) 0.083% nebulizer solution Take 3 mLs (2.5 mg total) by nebulization every 4 (four) hours as needed for wheezing or shortness of breath. 11/11/23   Johnny Garnette LABOR, MD  albuterol  (VENTOLIN  HFA) 108 [phone] Base) MCG/ACT inhaler Inhale 2 puffs into the lungs every 4 (four) hours as needed for wheezing or shortness of breath. 11/11/23   Johnny Garnette LABOR, MD  amLODipine  (NORVASC ) 5 MG tablet TAKE 1 TABLET DAILY 11/26/23   Johnny Garnette LABOR, MD  azelastine  (ASTELIN ) 0.1 % nasal spray Place 1 spray into both nostrils 2 (two) times daily. Use in each nostril as directed 02/09/24   Hope Almarie ORN, NP  BESIVANCE 0.6 % SUSP Place 1 drop into both eyes See admin instructions. Instill 1 drop into both eyes 3 times daily on the day of monthly eye injections then 4 times daily the day after 07/24/20   [provider]  Carboxymethylcellul-Glycerin (LUBRICATING EYE DROPS OP) Place 1 drop into both eyes daily as needed (dry eyes).    [provider]  cyanocobalamin  (VITAMIN B12) 1000 MCG/ML injection Inject 1 mL (1,000 mcg total) into the muscle every 30 (thirty) days. 11/30/22   Johnny Garnette LABOR, MD  cyclobenzaprine  (FLEXERIL ) 10 MG tablet Take 1 tablet (10 mg total) by mouth 3 (three) times daily as needed for muscle spasms. 04/04/24   Johnny Garnette LABOR, MD  fluticasone  (FLONASE ) 50 MCG/ACT nasal spray Place 1 spray into both nostrils daily. Patient taking differently: Place 1 spray into both nostrils daily as needed for allergies. 02/04/23   Sood, Vineet, MD  fluticasone -salmeterol (ADVAIR) 250-50 MCG/ACT AEPB Inhale 1 puff into the lungs in the morning and at bedtime. 04/19/23   Sood, Vineet, MD  guaiFENesin  (MUCINEX ) 600 MG 12 hr tablet Take 1 tablet (600 mg total) by mouth 2 (two) times daily as needed for cough or to loosen phlegm. 02/09/24   Hope Almarie ORN, NP   hydrocortisone cream 1 % Apply 1 Application topically daily as needed for itching.    [provider]  MAGNESIUM  GLUCONATE PO Take 1 tablet by mouth daily.    [provider]  Methylcellulose, Laxative, (CITRUCEL PO) Take 1-2 tablets by mouth daily as needed (constipation).    [provider]  Multiple Vitamins-Minerals (PRESERVISION AREDS 2) CAPS Take 1 capsule by mouth 2 (two) times daily.    [provider]  neomycin -bacitracin -polymyxin (NEOSPORIN) OINT Apply 1 Application topically as needed for wound care.    [provider]  omeprazole  (PRILOSEC) 40 MG capsule TAKE 1 CAPSULE DAILY Patient taking differently: Take 40 mg by mouth daily as needed (  heartburn). 02/22/23   Johnny Garnette LABOR, MD  potassium chloride  SA (KLOR-CON  M) 20 MEQ tablet TAKE 1 TABLET DAILY 09/07/23   Johnny Garnette LABOR, MD  pravastatin  (PRAVACHOL ) 40 MG tablet TAKE 1 TABLET AT BEDTIME 09/27/23   Johnny Garnette LABOR, MD  traMADol  (ULTRAM ) 50 MG tablet Take 2 tablets (100 mg total) by mouth every 6 (six) hours as needed. 04/04/24   Johnny Garnette LABOR, MD  valsartan  (DIOVAN ) 160 MG tablet TAKE 1 TABLET DAILY 01/25/24   Johnny Garnette LABOR, MD  XARELTO  20 MG TABS tablet TAKE 1 TABLET DAILY 03/08/24   Johnny Garnette LABOR, MD    Family History Family History  Problem Relation Age of Onset   Stroke Mother    Breast cancer Mother    Heart attack Mother    Other Mother        brain tunor   Heart disease Father    Hypertension Father    Bladder Cancer Father    Parkinson's disease Father    Stroke Father    Breast cancer Brother    Atrial fibrillation Brother    Tremor Brother    Stroke Brother    Hypertension Brother    Breast cancer Maternal Grandmother    Heart disease Maternal Grandmother    Heart disease Maternal Grandfather    Arthritis Daughter    Heart disease Daughter    Colitis Daughter        Lymphocytic   Colon cancer Neg Hx     Social History Social History   Tobacco Use    Smoking status: Former    Current packs/day: 0.00    Average packs/day: 1 pack/day for 28.0 years (28.0 ttl pk-yrs)    Types: Cigarettes    Start date: 10/12/1949    Quit date: 10/12/1977    Years since quitting: 46.5   Smokeless tobacco: Never  Vaping Use   Vaping status: Never Used  Substance Use Topics   Alcohol use: Not Currently    Comment: occasional   Drug use: No     Allergies   Patient has no known allergies.   Review of Systems Review of Systems  Per HPI  Physical Exam Triage Vital Signs ED Triage Vitals  Encounter Vitals Group     BP 04/14/24 0939 (!) 150/75     Girls Systolic BP Percentile --      Girls Diastolic BP Percentile --      Boys Systolic BP Percentile --      Boys Diastolic BP Percentile --      Pulse Rate 04/14/24 0939 83     Resp 04/14/24 0939 18     Temp 04/14/24 0939 98.1 F (36.7 C)     Temp Source 04/14/24 0939 Oral     SpO2 04/14/24 0939 96 %     Weight --      Height --      Head Circumference --      Peak Flow --      Pain Score 04/14/24 0938 0     Pain Loc --      Pain Education --      Exclude from Growth Chart --    No data found.  Updated Vital Signs BP (!) 150/75 (BP Location: Right Arm)   Pulse 83   Temp 98.1 F (36.7 C) (Oral)   Resp 18   SpO2 96%   Visual Acuity Right Eye Distance:   Left Eye Distance:   Bilateral Distance:  Right Eye Near:   Left Eye Near:    Bilateral Near:     Physical Exam Vitals and nursing note reviewed.  Constitutional:      General: He is awake. He is not in acute distress.    Appearance: Normal appearance. He is well-developed and well-groomed. He is not ill-appearing.  HENT:     Right Ear: Tympanic membrane, ear canal and external ear normal.     Left Ear: External ear normal. No drainage, swelling or tenderness. A foreign body is present.     Ears:     Comments: Clear approximately 1.5 cm dome-shaped foreign body noted to left ear canal Skin:    General: Skin is warm and  dry.  Neurological:     Mental Status: He is alert.  Psychiatric:        Behavior: Behavior is cooperative.      UC Treatments / Results  Labs (all labs ordered are listed, but only abnormal results are displayed) Labs Reviewed - No data to display  EKG   Radiology No results found.  Procedures Procedures (including critical care time)  Medications Ordered in UC Medications - No data to display  Initial Impression / Assessment and Plan / UC Course  I have reviewed the triage vital signs and the nursing notes.  Pertinent labs & imaging results that were available during my care of the patient were reviewed by me and considered in my medical decision making (see chart for details).     Patient is well-appearing.  Vitals are stable.  Foreign body successfully removed from ear without difficulty.  Discussed follow-up and return precautions. Final Clinical Impressions(s) / UC Diagnoses   Final diagnoses:  Foreign body of left ear, initial encounter     Discharge Instructions      We have removed the foreign body from your ear today.  There are no signs of infection and therefore you do not require any antibiotics at this time.  Follow-up with your primary care provider or return here as needed.    ED Prescriptions   None    PDMP not reviewed this encounter.   Johnie Flaming A, NP 04/14/24 [phone]

## 2024-04-14 NOTE — ED Triage Notes (Signed)
 Patient reports while trying to take out his hearing aid yesterday the dome stayed in his ear and he is unable to remove it (left ear).  Patient denies pain.

## 2024-04-14 NOTE — Discharge Instructions (Signed)
 We have removed the foreign body from your ear today.  There are no signs of infection and therefore you do not require any antibiotics at this time.  Follow-up with your primary care provider or return here as needed.

## 2024-04-19 ENCOUNTER — Ambulatory Visit: Admitting: *Deleted

## 2024-04-19 DIAGNOSIS — E538 Deficiency of other specified B group vitamins: Secondary | ICD-10-CM

## 2024-04-19 MED ORDER — CYANOCOBALAMIN 1000 MCG/ML IJ SOLN
1000.0000 ug | Freq: Once | INTRAMUSCULAR | Status: AC
  Administered 2024-04-19: 1000 ug via INTRAMUSCULAR

## 2024-04-19 NOTE — Progress Notes (Signed)
 Per orders of Dr. Clent Ridges, injection of Cyanocobalamin given by Johnella Moloney. Patient tolerated injection well.

## 2024-04-25 DIAGNOSIS — R310 Gross hematuria: Secondary | ICD-10-CM | POA: Diagnosis not present

## 2024-04-25 DIAGNOSIS — N3000 Acute cystitis without hematuria: Secondary | ICD-10-CM | POA: Diagnosis not present

## 2024-04-27 ENCOUNTER — Other Ambulatory Visit: Payer: Self-pay

## 2024-04-27 ENCOUNTER — Emergency Department (HOSPITAL_COMMUNITY)
Admission: EM | Admit: 2024-04-27 | Discharge: 2024-04-27 | Disposition: A | Discharge status: Home / Self Care | Attending: Emergency Medicine | Admitting: Emergency Medicine

## 2024-04-27 ENCOUNTER — Emergency Department (HOSPITAL_COMMUNITY)

## 2024-04-27 DIAGNOSIS — Z79899 Other long term (current) drug therapy: Secondary | ICD-10-CM | POA: Diagnosis not present

## 2024-04-27 DIAGNOSIS — S0990XA Unspecified injury of head, initial encounter: Secondary | ICD-10-CM | POA: Diagnosis not present

## 2024-04-27 DIAGNOSIS — M25511 Pain in right shoulder: Secondary | ICD-10-CM | POA: Insufficient documentation

## 2024-04-27 DIAGNOSIS — W01198A Fall on same level from slipping, tripping and stumbling with subsequent striking against other object, initial encounter: Secondary | ICD-10-CM | POA: Insufficient documentation

## 2024-04-27 DIAGNOSIS — S199XXA Unspecified injury of neck, initial encounter: Secondary | ICD-10-CM | POA: Diagnosis not present

## 2024-04-27 DIAGNOSIS — Z7901 Long term (current) use of anticoagulants: Secondary | ICD-10-CM | POA: Insufficient documentation

## 2024-04-27 DIAGNOSIS — M19011 Primary osteoarthritis, right shoulder: Secondary | ICD-10-CM | POA: Diagnosis not present

## 2024-04-27 DIAGNOSIS — W19XXXA Unspecified fall, initial encounter: Secondary | ICD-10-CM

## 2024-04-27 DIAGNOSIS — S0101XA Laceration without foreign body of scalp, initial encounter: Secondary | ICD-10-CM | POA: Insufficient documentation

## 2024-04-27 DIAGNOSIS — R220 Localized swelling, mass and lump, head: Secondary | ICD-10-CM | POA: Diagnosis not present

## 2024-04-27 DIAGNOSIS — Z043 Encounter for examination and observation following other accident: Secondary | ICD-10-CM | POA: Diagnosis not present

## 2024-04-27 DIAGNOSIS — Y92008 Other place in unspecified non-institutional (private) residence as the place of occurrence of the external cause: Secondary | ICD-10-CM | POA: Diagnosis not present

## 2024-04-27 DIAGNOSIS — G939 Disorder of brain, unspecified: Secondary | ICD-10-CM | POA: Diagnosis not present

## 2024-04-27 MED ORDER — LIDOCAINE-EPINEPHRINE-TETRACAINE (LET) TOPICAL GEL
3.0000 mL | Freq: Once | TOPICAL | Status: AC
  Administered 2024-04-27: 3 mL via TOPICAL
  Filled 2024-04-27: qty 3

## 2024-04-27 NOTE — ED Provider Notes (Signed)
 [place] EMERGENCY DEPARTMENT AT [facility] Provider Note   CSN: [id number] Arrival date & time: 04/27/24  1948     Patient presents with: Fall on Xarelto   (Head laceration )   Guy Moore is a 89 y.o. male.   89 year old male here today after a nonsyncopal fall from standing.  Patient was walking out onto his porch, he was wearing slippers, it was raining, he had his hand on a chair, and the chair gave way.  He fell over backward and struck the back of his head.  He takes Xarelto  for atrial fibrillation.  Did not lose consciousness.        Prior to Admission medications   Medication Sig Start Date End Date Taking? Authorizing Provider  albuterol  (PROVENTIL ) (2.5 MG/3ML) 0.083% nebulizer solution Take 3 mLs (2.5 mg total) by nebulization every 4 (four) hours as needed for wheezing or shortness of breath. 11/11/23   Johnny Garnette LABOR, MD  albuterol  (VENTOLIN  HFA) 108 (90 Base) MCG/ACT inhaler Inhale 2 puffs into the lungs every 4 (four) hours as needed for wheezing or shortness of breath. 11/11/23   Johnny Garnette LABOR, MD  amLODipine  (NORVASC ) 5 MG tablet TAKE 1 TABLET DAILY 11/26/23   Johnny Garnette LABOR, MD  azelastine  (ASTELIN ) 0.1 % nasal spray Place 1 spray into both nostrils 2 (two) times daily. Use in each nostril as directed 02/09/24   Hope Almarie ORN, NP  BESIVANCE 0.6 % SUSP Place 1 drop into both eyes See admin instructions. Instill 1 drop into both eyes 3 times daily on the day of monthly eye injections then 4 times daily the day after 07/24/20   [provider]  Carboxymethylcellul-Glycerin (LUBRICATING EYE DROPS OP) Place 1 drop into both eyes daily as needed (dry eyes).    [provider]  cyanocobalamin  (VITAMIN B12) 1000 MCG/ML injection Inject 1 mL (1,000 mcg total) into the muscle every 30 (thirty) days. 11/30/22   Johnny Garnette LABOR, MD  cyclobenzaprine  (FLEXERIL ) 10 MG tablet Take 1 tablet (10 mg total) by mouth 3 (three) times daily as needed for  muscle spasms. 04/04/24   Johnny Garnette LABOR, MD  fluticasone  (FLONASE ) 50 MCG/ACT nasal spray Place 1 spray into both nostrils daily. Patient taking differently: Place 1 spray into both nostrils daily as needed for allergies. 02/04/23   Sood, Vineet, MD  fluticasone -salmeterol (ADVAIR) 250-50 MCG/ACT AEPB Inhale 1 puff into the lungs in the morning and at bedtime. 04/19/23   Sood, Vineet, MD  guaiFENesin  (MUCINEX ) 600 MG 12 hr tablet Take 1 tablet (600 mg total) by mouth 2 (two) times daily as needed for cough or to loosen phlegm. 02/09/24   Hope Almarie ORN, NP  hydrocortisone cream 1 % Apply 1 Application topically daily as needed for itching.    [provider]  MAGNESIUM  GLUCONATE PO Take 1 tablet by mouth daily.    [provider]  Methylcellulose, Laxative, (CITRUCEL PO) Take 1-2 tablets by mouth daily as needed (constipation).    [provider]  Multiple Vitamins-Minerals (PRESERVISION AREDS 2) CAPS Take 1 capsule by mouth 2 (two) times daily.    [provider]  neomycin -bacitracin -polymyxin (NEOSPORIN) OINT Apply 1 Application topically as needed for wound care.    [provider]  omeprazole  (PRILOSEC) 40 MG capsule TAKE 1 CAPSULE DAILY Patient taking differently: Take 40 mg by mouth daily as needed (heartburn). 02/22/23   Johnny Garnette LABOR, MD  potassium chloride  SA (KLOR-CON  M) 20 MEQ tablet TAKE 1  TABLET DAILY 09/07/23   Johnny Garnette LABOR, MD  pravastatin  (PRAVACHOL ) 40 MG tablet TAKE 1 TABLET AT BEDTIME 09/27/23   Johnny Garnette LABOR, MD  traMADol  (ULTRAM ) 50 MG tablet Take 2 tablets (100 mg total) by mouth every 6 (six) hours as needed. 04/04/24   Johnny Garnette LABOR, MD  valsartan  (DIOVAN ) 160 MG tablet TAKE 1 TABLET DAILY 01/25/24   Johnny Garnette LABOR, MD  XARELTO  20 MG TABS tablet TAKE 1 TABLET DAILY 03/08/24   Johnny Garnette LABOR, MD    Allergies: Patient has no known allergies.    Review of Systems  Updated Vital Signs BP 122/70   Pulse 77   Temp 98.2 F (36.8  C)   Resp (!) 24   Ht 5' 10 (1.778 m)   Wt 95.3 kg   SpO2 100%   BMI 30.13 kg/m   Physical Exam Vitals reviewed.  HENT:     Head: Normocephalic.     Comments: 2 cm laceration to the occiput Cardiovascular:     Rate and Rhythm: Normal rate.  Pulmonary:     Effort: Pulmonary effort is normal.  Abdominal:     General: Abdomen is flat. There is no distension.     Palpations: Abdomen is soft.     Tenderness: There is no abdominal tenderness. There is no guarding.  Musculoskeletal:     Cervical back: Normal range of motion.     Comments: Patient with some tenderness in his right shoulder.  No deformity.  No tenderness to palpation in the upper arms, elbows, forearms or wrists.  No tenderness to palpation in the chest.  Pelvis stable, nontender.  No tenderness, deformities noted on bilateral upper legs, knees, lower legs or ankles.  Patient able to lift both legs from the bed.  Neurological:     General: No focal deficit present.     (all labs ordered are listed, but only abnormal results are displayed) Labs Reviewed - No data to display  EKG: None  Radiology: DG Shoulder Right Result Date: 04/27/2024 CLINICAL DATA:  Fall EXAM: RIGHT SHOULDER - 2+ VIEW COMPARISON:  None Available. FINDINGS: No fracture or malalignment. Moderate AC joint and mild glenohumeral degenerative change. Right apex is clear IMPRESSION: No acute osseous abnormality. Electronically Signed   By: Luke Bun M.D.   On: 04/27/2024 20:47   CT HEAD WO CONTRAST Result Date: 04/27/2024 EXAM: CT HEAD AND CERVICAL SPINE 04/27/2024 08:16:00 PM TECHNIQUE: CT of the head and cervical spine was performed without the administration of intravenous contrast. Multiplanar reformatted images are provided for review. Automated exposure control, iterative reconstruction, and/or weight based adjustment of the mA/kV was utilized to reduce the radiation dose to as low as reasonably achievable. COMPARISON: CT cervical spine  12/03/2015 and CT head 12/03/2015. CLINICAL HISTORY: Head trauma, moderate-severe. Polytrauma, blunt. Level 2 - Fall on thinners. FINDINGS: CT HEAD BRAIN AND VENTRICLES: No acute intracranial hemorrhage. No mass effect or midline shift. No abnormal extra-axial fluid collection. Gray-white differentiation is maintained. No hydrocephalus. Chronic microvascular ischemia and generalized atrophy. ORBITS: No acute abnormality. SINUSES AND MASTOIDS: No acute abnormality. SOFT TISSUES AND SKULL: No acute skull fracture. No acute soft tissue abnormality. 11 mm subcutaneous nodule in the left face at the level of the temporomandibular joint, which may represent a sebaceous cyst. Recommend correlation with visual inspection. CT CERVICAL SPINE BONES AND ALIGNMENT: No acute fracture or traumatic malalignment. DEGENERATIVE CHANGES: Age-related multilevel spondylosis and facet arthropathy. No severe spinal canal narrowing. SOFT TISSUES: No prevertebral  soft tissue swelling. 19 mm cystic lesion in the right posterior neck, increased in size since 2017 when it measured 9 mm, likely a sebaceous cyst. IMPRESSION: 1. No acute intracranial abnormality. 2. No acute fracture or traumatic malalignment of the cervical spine. 3. Subcutaneous cystic lesions in the left face and right posterior neck favored to represent sebaceous cysts. Correlation with direct inspection is recommended. Electronically signed by: Norman Gatlin MD 04/27/2024 08:32 PM EDT RP Workstation: [id number]   CT CERVICAL SPINE WO CONTRAST Result Date: 04/27/2024 EXAM: CT HEAD AND CERVICAL SPINE 04/27/2024 08:16:00 PM TECHNIQUE: CT of the head and cervical spine was performed without the administration of intravenous contrast. Multiplanar reformatted images are provided for review. Automated exposure control, iterative reconstruction, and/or weight based adjustment of the mA/kV was utilized to reduce the radiation dose to as low as reasonably achievable. COMPARISON: CT  cervical spine 12/03/2015 and CT head 12/03/2015. CLINICAL HISTORY: Head trauma, moderate-severe. Polytrauma, blunt. Level 2 - Fall on thinners. FINDINGS: CT HEAD BRAIN AND VENTRICLES: No acute intracranial hemorrhage. No mass effect or midline shift. No abnormal extra-axial fluid collection. Gray-white differentiation is maintained. No hydrocephalus. Chronic microvascular ischemia and generalized atrophy. ORBITS: No acute abnormality. SINUSES AND MASTOIDS: No acute abnormality. SOFT TISSUES AND SKULL: No acute skull fracture. No acute soft tissue abnormality. 11 mm subcutaneous nodule in the left face at the level of the temporomandibular joint, which may represent a sebaceous cyst. Recommend correlation with visual inspection. CT CERVICAL SPINE BONES AND ALIGNMENT: No acute fracture or traumatic malalignment. DEGENERATIVE CHANGES: Age-related multilevel spondylosis and facet arthropathy. No severe spinal canal narrowing. SOFT TISSUES: No prevertebral soft tissue swelling. 19 mm cystic lesion in the right posterior neck, increased in size since 2017 when it measured 9 mm, likely a sebaceous cyst. IMPRESSION: 1. No acute intracranial abnormality. 2. No acute fracture or traumatic malalignment of the cervical spine. 3. Subcutaneous cystic lesions in the left face and right posterior neck favored to represent sebaceous cysts. Correlation with direct inspection is recommended. Electronically signed by: Norman Gatlin MD 04/27/2024 08:32 PM EDT RP Workstation: [id number]     .Laceration Repair  Date/Time: 04/27/2024 9:00 PM  Performed by: Mannie Fairy DASEN, DO Authorized by: Mannie Fairy DASEN, DO   Consent:    Consent obtained:  Verbal   Consent given by:  Patient   Risks discussed:  Infection and pain Universal protocol:    Patient identity confirmed:  Verbally with patient Anesthesia:    Anesthesia method:  Topical application   Topical anesthetic:  LET Laceration details:    Location:  Scalp    Length (cm):  2 Exploration:    Limited defect created (wound extended): no     Hemostasis achieved with:  Direct pressure Treatment:    Area cleansed with:  Saline   Amount of cleaning:  Standard   Irrigation solution:  Sterile saline Skin repair:    Repair method:  Staples   Number of staples:  4 Approximation:    Approximation:  Close    Medications Ordered in the ED  lidocaine -EPINEPHrine -tetracaine  (LET) topical gel (3 mLs Topical Given 04/27/24 2003)                                    Medical Decision Making This is an 89 year old male presenting to the emergency room due to a fall on blood thinners.  Patient came in as a level 2 trauma due  to anticoagulated status with fall.  Plan-patient without any tenderness in abdomen, back or lower extremities.  Was able to ambulate from wheelchair to bed without difficulty.  Obtained imaging of the patient's head, neck which did not show any acute process.  Reviewed the images independently and agree with radiologist interpretation.  Plain films of the patient's right shoulder do not show fracture.  Likely bruise.  Patient able to function at his baseline.  Considered labs in this patient, however given story did not believe they were indicated at this time.  He was able to ambulate in the emergency room.  Small laceration of the back of the head repaired.  Last tetanus was 1 week ago at his PCPs office.  He is appropriate for discharge.  Amount and/or Complexity of Data Reviewed Radiology: ordered.        Final diagnoses:  Laceration of scalp, initial encounter  Fall, initial encounter    ED Discharge Orders     None          Mannie Fairy DASEN, DO 04/27/24 2101

## 2024-04-27 NOTE — Progress Notes (Signed)
 Orthopedic Tech Progress Note Patient Details:  Guy Moore 19-Feb-1934 [id number]  Level II trauma, no ortho tech orders at this time.  Patient ID: Guy Moore, male   DOB: 07-05-1934, 89 y.o.   MRN: [id number]  [name] 04/27/2024, 8:15 PM

## 2024-04-27 NOTE — ED Triage Notes (Signed)
 Patient tripped and fell this evening at home , hit his head against the ground , no LOC , takes Xarelto  presents with scalp laceration approx. 1.5.

## 2024-04-27 NOTE — Discharge Instructions (Signed)
 While you are in the emergency room, you had CT scans done of your head and neck that were negative.  You have an x-ray done of your shoulder that was negative.  The staples in your head need to come out in 2 weeks.  You can go to your primary care doctor's office for this.  Return to the emergency room if you have inability to walk, lose consciousness, or develop pain in your abdomen or back.  It is very likely that you will be sore tomorrow.  You can take Tylenol .

## 2024-04-27 NOTE — ED Notes (Signed)
 Trauma Response Nurse Documentation   Guy Moore is a 89 y.o. male arriving to [facility] ED via POV  On Xarelto  (rivaroxaban ) daily. Trauma was activated as a Level 2 by ED charge RN based on the following trauma criteria Elderly patients > 65 with head trauma on anti-coagulation (excluding ASA).  Patient cleared for CT by Dr. Mannie EDP. Pt transported to CT with trauma response nurse present to monitor. RN remained with the patient throughout their absence from the department for clinical observation.   GCS 15.   History   Past Medical History:  Diagnosis Date   Allergic rhinitis    Arthritis    Asthma    Atrial fibrillation [facility])    sees Dr. Belvie Heckler at [facility]    Atrial flutter (HCC)    BCC (basal cell carcinoma of skin)    Nose   Chronic kidney disease    Complication of anesthesia    no problems per patient   COPD with emphysema (HCC)    sees Dr. Carolynne Allan    Diverticulosis    Dysrhythmia    afib, flutter and tachycardia   Erosive esophagitis    Esophageal stricture    Gastric ulcer    Hearing loss    uses amplification   Hemorrhoids    Hiatal hernia    History of kidney stones    HTN (hypertension)    Hyperlipidemia    Knee problem    2% permanent partial impairment Right   Macular degeneration    Perineal abscess    Pneumonia    Pre-diabetes    Presence of permanent cardiac pacemaker    Sleep apnea    cpap     Past Surgical History:  Procedure Laterality Date   CARDIAC ELECTROPHYSIOLOGY STUDY AND ABLATION     x 4 (sees Dr. Belvie Linger at [facility])   CO2 LASER APPLICATION N/A 03/26/2023   Procedure: CO2 LASER ABLATION OF PENIS;  Surgeon: Selma Donnice SAUNDERS, MD;  Location: [facility] ORS;  Service: Urology;  Laterality: N/A;   CYST EXCISION PERINEAL     CYSTOSCOPY WITH URETEROSCOPY AND STENT PLACEMENT Right 10/29/2022   Procedure: CYSTOSCOPY WITH RETROGRADE WITH URETHERAL DILITATIONRIGHT DIAGNOSTIC  URETEROSCOPY AND STENT  PLACEMENT;  Surgeon: Selma Donnice SAUNDERS, MD;  Location: [facility];  Service: Urology;  Laterality: Right;   CYSTOSCOPY/URETEROSCOPY/HOLMIUM LASER/STENT PLACEMENT Right 11/27/2022   Procedure: CYSTOSCOPY/ RIGHT RETROGRADE PYELOGRAM/RIGHT HOLMIUM LASER/RIGHT STENT PLACEMENT/BASKET;  Surgeon: Selma Donnice SAUNDERS, MD;  Location: [facility];  Service: Urology;  Laterality: Right;  60 MINUTES NEEDED FOR CASE   INSERT / REPLACE / REMOVE PACEMAKER     NOSE SURGERY     septoplasty   PENILE BIOPSY N/A 11/27/2022   Procedure: PENILE BIOPSY;  Surgeon: Selma Donnice SAUNDERS, MD;  Location: [facility];  Service: Urology;  Laterality: N/A;   TRANSURETHRAL RESECTION OF PROSTATE N/A 03/26/2023   Procedure: BIPOLAR TRANSURETHRAL RESECTION OF THE PROSTATE (TURP);  Surgeon: Selma Donnice SAUNDERS, MD;  Location: [facility];  Service: Urology;  Laterality: N/A;  90 MINUTES NEEDED FOR CASE   UVULOPALATOPHARYNGOPLASTY  06/13/1979   VASECTOMY  06/12/1969   w/complications       Initial Focused Assessment (If applicable, or please see trauma documentation): Alert/oriented male presents POV after a fall on his deck resulting in posterior head lac/abrasion. Bleeding controlled. States no LOC, reports unsteady on his feet.   Airway patent, BS clear Bleeding from posterior head lac controlled GCS 15 PERRLA 3 sluggish  CT's Completed:   CT Head and CT C-Spine   Interventions:  LET to wound, closure by EDP Right shoulder XRAY CT head and c-spine  Plan for disposition:  Discharge home   Consults completed:  none   Event Summary: Presents after a GLF at his home with posterior head lac. On Xarelto . Bleeding controlled, LET applied. Trauma scans unremarkable, D/C home.   Bedside handoff with ED RN Almarie.    Guy Moore O Guy Moore  Trauma Response RN  Please call TRN at [phone] for further assistance.

## 2024-04-28 ENCOUNTER — Telehealth: Payer: Self-pay | Admitting: Internal Medicine

## 2024-04-28 ENCOUNTER — Telehealth: Payer: Self-pay

## 2024-04-28 NOTE — Transitions of Care (Post Inpatient/ED Visit) (Signed)
 04/28/2024  Name: Guy Moore MRN: [id number] DOB: 03/19/1934  Today's TOC FU Call Status: Today's TOC FU Call Status:: Successful TOC FU Call Completed TOC FU Call Complete Date: 04/28/24 Patient's Name and Date of Birth confirmed.  Transition Care Management Follow-up Telephone Call Date of Discharge: 04/27/24 Discharge Facility: [facility]) Type of Discharge: Emergency Department Reason for ED Visit: Other: (Fall) How have you been since you were released from the hospital?: Same Any questions or concerns?: No  Items Reviewed: Did you receive and understand the discharge instructions provided?: Yes Medications obtained,verified, and reconciled?: Yes (Medications Reviewed) Any new allergies since your discharge?: No Dietary orders reviewed?: No Do you have support at home?: Yes People in Home [RPT]: spouse  Medications Reviewed Today: Medications Reviewed Today     Reviewed by Bedelia Pong, CMA (Certified Medical Assistant) on 04/28/24 at 1420  Med List Status: <None>   Medication Order Taking? Sig Documenting Provider Last Dose Status Informant  albuterol  (PROVENTIL ) (2.5 MG/3ML) 0.083% nebulizer solution [id number] Yes Take 3 mLs (2.5 mg total) by nebulization every 4 (four) hours as needed for wheezing or shortness of breath. Johnny Garnette LABOR, MD  Active   albuterol  (VENTOLIN  HFA) 108 [phone] Base) MCG/ACT inhaler [id number] Yes Inhale 2 puffs into the lungs every 4 (four) hours as needed for wheezing or shortness of breath. Johnny Garnette LABOR, MD  Active   amLODipine  (NORVASC ) 5 MG tablet [id number] Yes TAKE 1 TABLET DAILY Johnny Garnette LABOR, MD  Active   azelastine  (ASTELIN ) 0.1 % nasal spray [id number] Yes Place 1 spray into both nostrils 2 (two) times daily. Use in each nostril as directed  Patient taking differently: Place 1 spray into both nostrils 2 (two) times daily. Use in each nostril as directed, as needed   Hope Almarie ORN, NP  Active   BESIVANCE 0.6 % SUSP [id number]  Yes Place 1 drop into both eyes See admin instructions. Instill 1 drop into both eyes 3 times daily on the day of monthly eye injections then 4 times daily the day after [provider]  Active Self  Carboxymethylcellul-Glycerin (LUBRICATING EYE DROPS OP) [id number] Yes Place 1 drop into both eyes daily as needed (dry eyes). [provider]  Active Self  cyanocobalamin  (VITAMIN B12) 1000 MCG/ML injection [id number] Yes Inject 1 mL (1,000 mcg total) into the muscle every 30 (thirty) days. Johnny Garnette LABOR, MD  Active Self    Discontinued 04/28/24 1417 (Patient Preference)   fluticasone  (FLONASE ) 50 MCG/ACT nasal spray [id number] Yes Place 1 spray into both nostrils daily.  Patient taking differently: Place 1 spray into both nostrils daily as needed for allergies.   Sood, Vineet, MD  Active Self  fluticasone -salmeterol (ADVAIR) 250-50 MCG/ACT AEPB [id number] Yes Inhale 1 puff into the lungs in the morning and at bedtime. Shellia Oh, MD  Active     Discontinued 04/28/24 1418 (Patient Preference)   hydrocortisone cream 1 % [id number] Yes Apply 1 Application topically daily as needed for itching. [provider]  Active Self  MAGNESIUM  GLUCONATE PO [id number] Yes Take 1 tablet by mouth daily. [provider]  Active Self  Methylcellulose, Laxative, (CITRUCEL PO) [id number]  Take 1-2 tablets by mouth daily as needed (constipation). [provider]  Active Self  Multiple Vitamins-Minerals (PRESERVISION AREDS 2) CAPS [id number] Yes Take 1 capsule by mouth 2 (two) times daily. [provider]  Active Self  neomycin -bacitracin -polymyxin (NEOSPORIN) OINT [id number] Yes Apply 1 Application topically as needed for wound care. [provider]  Active Self  omeprazole  (PRILOSEC) 40 MG capsule [id number] Yes TAKE 1 CAPSULE DAILY Johnny Garnette LABOR, MD  Active Self  potassium chloride  SA (KLOR-CON  M) 20 MEQ tablet [id number] Yes TAKE 1 TABLET DAILY Johnny Garnette LABOR, MD   Active   pravastatin  (PRAVACHOL ) 40 MG tablet [id number] Yes TAKE 1 TABLET AT BEDTIME Johnny Garnette LABOR, MD  Active   sulfamethoxazole-trimethoprim (BACTRIM DS) 800-160 MG tablet [id number] Yes Take 1 tablet by mouth 2 (two) times daily. [provider]  Active     Discontinued 04/28/24 1417 (Patient Preference)   valsartan  (DIOVAN ) 160 MG tablet [id number] Yes TAKE 1 TABLET DAILY Johnny Garnette LABOR, MD  Active   XARELTO  20 MG TABS tablet [id number] Yes TAKE 1 TABLET DAILY Johnny Garnette LABOR, MD  Active             Home Care and Equipment/Supplies: Were Home Health Services Ordered?: No Any new equipment or medical supplies ordered?: No  Functional Questionnaire: Do you need assistance with bathing/showering or dressing?: No Do you need assistance with meal preparation?: No Do you need assistance with eating?: No Do you have difficulty maintaining continence: No Do you need assistance with getting out of bed/getting out of a chair/moving?: No Do you have difficulty managing or taking your medications?: No  Follow up appointments reviewed: PCP Follow-up appointment confirmed?: Yes Date of PCP follow-up appointment?: 05/03/24 Follow-up Provider: Dr. Johnny [place]: Willeen Craver, CMA

## 2024-04-28 NOTE — Telephone Encounter (Signed)
 Called to reschedule patient appointment to due provider pal  request. I talked  to patient and they are aware of the changes that was made to the upcoming appointment

## 2024-05-03 ENCOUNTER — Ambulatory Visit: Admitting: Family Medicine

## 2024-05-03 ENCOUNTER — Encounter: Payer: Self-pay | Admitting: Family Medicine

## 2024-05-03 VITALS — BP 120/70 | HR 81 | Temp 97.7°F | Wt 226.0 lb

## 2024-05-03 DIAGNOSIS — S0093XD Contusion of unspecified part of head, subsequent encounter: Secondary | ICD-10-CM

## 2024-05-03 DIAGNOSIS — S0101XD Laceration without foreign body of scalp, subsequent encounter: Secondary | ICD-10-CM

## 2024-05-03 DIAGNOSIS — S139XXD Sprain of joints and ligaments of unspecified parts of neck, subsequent encounter: Secondary | ICD-10-CM

## 2024-05-03 NOTE — Progress Notes (Signed)
   Subjective:    Patient ID: Guy Moore, male    DOB: [birth year], 89 y.o.   MRN: [id number]  HPI Here to follow up an ED visit on 04-27-24 for a fall that occurred on his back porch. He was wearing slippers and the porch was wet from rain, and this caused his feet to slip out from under him. He fell backwards and struck the back of his head on a step. No LOC. At the ED he had a scalp laceration, and he complained of a headache and a stiff neck. He was intact neurologically. He had  negative Xrays of his right shoulder,  and he had negative CT scans of his head and cervical spine. The laceration was closed with 4 staples. Since then he has done well except for some neck stiffness.    Review of Systems  Constitutional: Negative.   Respiratory: Negative.    Cardiovascular: Negative.   Gastrointestinal: Negative.   Genitourinary: Negative.   Musculoskeletal:  Positive for neck stiffness.  Skin:  Positive for wound.  Neurological: Negative.        Objective:   Physical Exam Constitutional:      General: He is not in acute distress.    Appearance: Normal appearance.  Cardiovascular:     Rate and Rhythm: Normal rate and regular rhythm.     Pulses: Normal pulses.     Heart sounds: Normal heart sounds.  Pulmonary:     Effort: Pulmonary effort is normal.     Breath sounds: Normal breath sounds.  Skin:    Comments: There is a closed laceration on the vertex of the scalp. The wound looks clean. All 4 staples were removed.   Neurological:     Mental Status: He is alert and oriented to person, place, and time. Mental status is at baseline.           Assessment & Plan:  He is recovering from a scalp laceration from a fall. He will now follow up as needed. We spent a total of ( 33  ) minutes reviewing records and discussing these issues.  Garnette Olmsted, MD

## 2024-05-04 DIAGNOSIS — R310 Gross hematuria: Secondary | ICD-10-CM | POA: Diagnosis not present

## 2024-05-08 DIAGNOSIS — N200 Calculus of kidney: Secondary | ICD-10-CM | POA: Diagnosis not present

## 2024-05-08 DIAGNOSIS — K5730 Diverticulosis of large intestine without perforation or abscess without bleeding: Secondary | ICD-10-CM | POA: Diagnosis not present

## 2024-05-08 DIAGNOSIS — R310 Gross hematuria: Secondary | ICD-10-CM | POA: Diagnosis not present

## 2024-05-15 ENCOUNTER — Ambulatory Visit: Payer: Medicare Other

## 2024-05-15 DIAGNOSIS — I4819 Other persistent atrial fibrillation: Secondary | ICD-10-CM

## 2024-05-15 LAB — CUP PACEART REMOTE DEVICE CHECK
Battery Remaining Longevity: 80 mo
Battery Voltage: 2.97 V
Brady Statistic AP VP Percent: 0 %
Brady Statistic AP VS Percent: 0 %
Brady Statistic AS VP Percent: 83.63 %
Brady Statistic AS VS Percent: 16.37 %
Brady Statistic RA Percent Paced: 0 %
Brady Statistic RV Percent Paced: 83.63 %
Date Time Interrogation Session: 20250804005150
Implantable Lead Connection Status: 753985
Implantable Lead Connection Status: 753985
Implantable Lead Implant Date: 20180124
Implantable Lead Implant Date: 20180124
Implantable Lead Location: 753859
Implantable Lead Location: 753860
Implantable Lead Model: 5076
Implantable Lead Model: 5076
Implantable Pulse Generator Implant Date: 20180124
Lead Channel Impedance Value: 323 Ohm
Lead Channel Impedance Value: 342 Ohm
Lead Channel Impedance Value: 399 Ohm
Lead Channel Impedance Value: 456 Ohm
Lead Channel Pacing Threshold Amplitude: 0.75 V
Lead Channel Pacing Threshold Pulse Width: 0.4 ms
Lead Channel Sensing Intrinsic Amplitude: 0.25 mV
Lead Channel Sensing Intrinsic Amplitude: 0.25 mV
Lead Channel Sensing Intrinsic Amplitude: 6.125 mV
Lead Channel Sensing Intrinsic Amplitude: 6.125 mV
Lead Channel Setting Pacing Amplitude: 1.75 V
Lead Channel Setting Pacing Pulse Width: 0.4 ms
Lead Channel Setting Sensing Sensitivity: 0.9 mV
Zone Setting Status: 755011

## 2024-05-16 ENCOUNTER — Ambulatory Visit: Payer: Self-pay | Admitting: Cardiology

## 2024-05-16 ENCOUNTER — Encounter (INDEPENDENT_AMBULATORY_CARE_PROVIDER_SITE_OTHER): Admitting: Ophthalmology

## 2024-05-16 DIAGNOSIS — H43813 Vitreous degeneration, bilateral: Secondary | ICD-10-CM

## 2024-05-16 DIAGNOSIS — H35033 Hypertensive retinopathy, bilateral: Secondary | ICD-10-CM

## 2024-05-16 DIAGNOSIS — D3131 Benign neoplasm of right choroid: Secondary | ICD-10-CM

## 2024-05-16 DIAGNOSIS — H353231 Exudative age-related macular degeneration, bilateral, with active choroidal neovascularization: Secondary | ICD-10-CM | POA: Diagnosis not present

## 2024-05-16 DIAGNOSIS — I10 Essential (primary) hypertension: Secondary | ICD-10-CM

## 2024-05-24 ENCOUNTER — Ambulatory Visit: Admitting: *Deleted

## 2024-05-24 DIAGNOSIS — E538 Deficiency of other specified B group vitamins: Secondary | ICD-10-CM

## 2024-05-24 MED ORDER — CYANOCOBALAMIN 1000 MCG/ML IJ SOLN
1000.0000 ug | Freq: Once | INTRAMUSCULAR | Status: AC
  Administered 2024-05-24 (×2): 1000 ug via INTRAMUSCULAR

## 2024-05-24 NOTE — Progress Notes (Signed)
 Per orders of Dr. Clent Ridges, injection of Cyanocobalamin given by Johnella Moloney. Patient tolerated injection well.

## 2024-06-15 DIAGNOSIS — H6121 Impacted cerumen, right ear: Secondary | ICD-10-CM | POA: Diagnosis not present

## 2024-06-21 DIAGNOSIS — H353231 Exudative age-related macular degeneration, bilateral, with active choroidal neovascularization: Secondary | ICD-10-CM | POA: Diagnosis not present

## 2024-06-21 DIAGNOSIS — H02834 Dermatochalasis of left upper eyelid: Secondary | ICD-10-CM | POA: Diagnosis not present

## 2024-06-21 DIAGNOSIS — D3131 Benign neoplasm of right choroid: Secondary | ICD-10-CM | POA: Diagnosis not present

## 2024-06-21 DIAGNOSIS — H02831 Dermatochalasis of right upper eyelid: Secondary | ICD-10-CM | POA: Diagnosis not present

## 2024-06-21 DIAGNOSIS — Z961 Presence of intraocular lens: Secondary | ICD-10-CM | POA: Diagnosis not present

## 2024-06-21 DIAGNOSIS — H04123 Dry eye syndrome of bilateral lacrimal glands: Secondary | ICD-10-CM | POA: Diagnosis not present

## 2024-06-21 DIAGNOSIS — H2189 Other specified disorders of iris and ciliary body: Secondary | ICD-10-CM | POA: Diagnosis not present

## 2024-06-22 ENCOUNTER — Encounter (INDEPENDENT_AMBULATORY_CARE_PROVIDER_SITE_OTHER): Admitting: Ophthalmology

## 2024-06-22 DIAGNOSIS — I10 Essential (primary) hypertension: Secondary | ICD-10-CM

## 2024-06-22 DIAGNOSIS — H353231 Exudative age-related macular degeneration, bilateral, with active choroidal neovascularization: Secondary | ICD-10-CM | POA: Diagnosis not present

## 2024-06-22 DIAGNOSIS — D3131 Benign neoplasm of right choroid: Secondary | ICD-10-CM | POA: Diagnosis not present

## 2024-06-22 DIAGNOSIS — H35033 Hypertensive retinopathy, bilateral: Secondary | ICD-10-CM

## 2024-06-22 DIAGNOSIS — H43813 Vitreous degeneration, bilateral: Secondary | ICD-10-CM

## 2024-06-26 ENCOUNTER — Ambulatory Visit (INDEPENDENT_AMBULATORY_CARE_PROVIDER_SITE_OTHER)

## 2024-06-26 DIAGNOSIS — E538 Deficiency of other specified B group vitamins: Secondary | ICD-10-CM | POA: Diagnosis not present

## 2024-06-26 MED ORDER — CYANOCOBALAMIN 1000 MCG/ML IJ SOLN
1000.0000 ug | Freq: Once | INTRAMUSCULAR | Status: AC
  Administered 2024-06-26: 1000 ug via INTRAMUSCULAR

## 2024-06-26 NOTE — Progress Notes (Signed)
Per orders of Dr. Fry, injection of Cyanocobalamin 1000 mcg given by Foy Mungia L Remi Lopata. °Patient tolerated injection well.  °

## 2024-07-10 NOTE — Progress Notes (Signed)
 Remote PPM Transmission

## 2024-07-18 NOTE — Progress Notes (Signed)
 "  @Patient  ID: Guy Moore, male    DOB: [birth year], 89 y.o.   MRN: [id number]  No chief complaint on file.   Referring provider: Johnny Garnette LABOR, MD  HPI: 89 year old male, former smoker. PMH significant for afib, HTN, COPD with asthma, OSA. Former patient of Dr. Shellia, last seen by Dr. Kara in February 2025.  PSG 07/24/09 >> AHI 58, SpO2 low 74%   Previous [facility] pulmolnary encounter: 12/01/23- Dr. Kara Jacquelene Moore is an 89 year old male, former smoker with atrial fibrillation, COPD/Asthma and OSA on CPAP who returns to pulmonary clinic for acute visit.   He has been experiencing a persistent cough for the past four weeks, which began with a cold that progressed to acute bronchitis. Initially, doxycycline  improved his symptoms, but the cough returned and is particularly bothersome when lying down. He describes the cough as more manageable when upright and notes clear or white sputum production along with post-nasal drip.  The cough significantly disrupts his sleep, and he uses a CPAP machine for sleep apnea. He has been losing weight, which may necessitate a repeat sleep study. His sleep was previously affected by prolonged catheter use following prostate surgery.  He uses a nebulizer every four hours, which provides some relief. He has not been using Flonase  recently, which he typically uses during allergy season. He experienced mild nausea while on doxycycline , which has persisted, and also reports a mild headache.  No current sore throat, fever, or chills. He notes post-nasal drip and clear or white sputum production, along with mild nausea and headache.  02/09/2024 Discussed the use of AI scribe software for clinical note transcription with the patient, who gave verbal consent to proceed.  History of Present Illness   Guy Moore is an 89 year old male who presents with persistent cough and postnasal drip. History significant for COPD/asthma and OSA on CPAP. Patient  was seen by Dr. Kara in February for persistent cough due to postviral reactive airway disease. Bronchitis symptoms originally treated with course of doxycline. CXR showed patchy consolidative opacities RLL concerning for pneumonia. He was prescribed prednisone .    Currently, he experiences persistent coughing, particularly in the late afternoon and early evening, with clear sputum production. He has difficulty distinguishing between symptoms caused by pollen and those related to asthma. The cough sometimes occurs at night. CXR today indicates resolution of the opacity.  He uses Advair twice daily and Flonase  for his symptoms. The drainage seems to originate from his sinuses, as he does not cough when holding his breath after using Advair. He has not been taking any additional medication specifically for the cough.  No wheezing or chest tightness, but he experiences shortness of breath when carrying items upstairs. He continues to take Xarelto  as a blood thinner.  He uses a CPAP machine for sleep apnea every night. He has experienced weight loss, which he believes has improved his breathing. He mentions a past issue with the CPAP machine, which resolved after unplugging it for 24 hours.  No fever or colored mucus. No wheezing or chest tightness, except for mild shortness of breath when exerting himself, such as carrying items upstairs.      Airview download 01/09/2024 - 02/07/2024 Usage days 30/30 days (100%) greater than 4 hours Average usage 9 hours 20 minutes Pressure 5 to 15 cm H2O (11.8 cm H2O-95%) Air leak 7.7 L/min (95%) AHI 0.7   07/19/2024- Interim hx  Discussed the use of AI  scribe software for clinical note transcription with the patient, who gave verbal consent to proceed.  History of Present Illness Guy Moore is an 89 year old male with COPD, asthma, and sleep apnea who presents for a follow-up visit.  He has a history of COPD and asthma, managed with an Advair  inhaler every twelve hours. He reports no current breathing difficulties or recent exacerbations. In the spring, he was started on Flonase  and Astelin  nasal sprays for postnasal drip contributing to a cough, which has since resolved.  He has severe sleep apnea diagnosed in 2010 with a sleep study showing fifty-eight apneic events per hour. He has been using a CPAP machine for the past fifteen years, except for a two-week period three months ago when he was unable to use it due to a head injury. Three months ago, he experienced a fall resulting in a head injury that required staples, preventing him from using the CPAP for two weeks. During this period, he did not experience symptoms such as dry mouth or disrupted sleep. He has since resumed CPAP use.  He mentions a recent weight gain of about eight pounds but notes that his current weight is still significantly lower than his weight at the time of his initial sleep study, having lost approximately twenty pounds since then.  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 Additionally, he is seeing a hematologist for a bone marrow issue.    Airview download 06/18/24-07/17/24 Usage 30/30 days Average usage 10 hours 6 minutes Pressure settings 5-15cm h20 Airleaks 6.2L/min (95%) AHI 0.9  No Known Allergies  Immunization History  Administered Date(s) Administered   Fluad Quad(high Dose 65+) 06/21/2019, 07/10/2020, 07/22/2022   Fluad Trivalent(High Dose 65+) 07/06/2023   INFLUENZA, HIGH DOSE SEASONAL PF 08/25/2018   Influenza Whole 08/12/2006, 10/21/2007, 07/10/2008, 07/19/2009, 07/12/2012   Influenza, Seasonal, Injecte, Preservative Fre 08/04/2013   Influenza-Unspecified 08/16/2015, 08/15/2016, 08/29/2017, 08/18/2021   PFIZER Comirnaty(Gray Top)Covid-19 Tri-Sucrose Vaccine 02/19/2021   PFIZER(Purple Top)SARS-COV-2  Vaccination 11/19/2019, 12/13/2019, 08/01/2020, 08/02/2020   Pfizer Covid-19 Vaccine Bivalent Booster 58yrs & up 08/18/2021   Pneumococcal Conjugate-13 01/17/2015   Pneumococcal Polysaccharide-23 04/20/1997, 03/14/2013   Td 10/09/2005   Tdap 03/16/2024   Unspecified SARS-COV-2 Vaccination 01/01/2024   Zoster, Live 03/19/2009    Past Medical History:  Diagnosis Date   Allergic rhinitis    Arthritis    Asthma    Atrial fibrillation (HCC)    sees Dr. Belvie Heckler at [facility]    Atrial flutter (HCC)    BCC (basal cell carcinoma of skin)    Nose   Chronic kidney disease    Complication of anesthesia    no problems per patient   COPD with emphysema (HCC)    sees Dr. Carolynne Allan    Diverticulosis    Dysrhythmia    afib, flutter and tachycardia   Erosive esophagitis    Esophageal stricture    Gastric ulcer    Hearing loss    uses amplification   Hemorrhoids    Hiatal hernia    History of kidney stones    HTN (hypertension)    Hyperlipidemia    Knee problem    2% permanent partial impairment Right   Macular degeneration    Perineal abscess    Pneumonia    Pre-diabetes    Presence  of permanent cardiac pacemaker    Sleep apnea    cpap    Tobacco History: Social History   Tobacco Use  Smoking Status Former   Current packs/day: 0.00   Average packs/day: 1 pack/day for 28.0 years (28.0 ttl pk-yrs)   Types: Cigarettes   Start date: 10/12/1949   Quit date: 10/12/1977   Years since quitting: 46.7  Smokeless Tobacco Never   Counseling given: Not Answered   Outpatient Medications Prior to Visit  Medication Sig Dispense Refill   albuterol  (PROVENTIL ) (2.5 MG/3ML) 0.083% nebulizer solution Take 3 mLs (2.5 mg total) by nebulization every 4 (four) hours as needed for wheezing or shortness of breath. 75 mL 5   albuterol  (VENTOLIN  HFA) 108 (90 Base) MCG/ACT inhaler Inhale 2 puffs into the lungs every 4 (four) hours as needed for wheezing or shortness of  breath. 18 g 11   amLODipine  (NORVASC ) 5 MG tablet TAKE 1 TABLET DAILY 90 tablet 3   azelastine  (ASTELIN ) 0.1 % nasal spray Place 1 spray into both nostrils 2 (two) times daily. Use in each nostril as directed (Patient taking differently: Place 1 spray into both nostrils 2 (two) times daily. Use in each nostril as directed, as needed) 30 mL 1   BESIVANCE 0.6 % SUSP Place 1 drop into both eyes See admin instructions. Instill 1 drop into both eyes 3 times daily on the day of monthly eye injections then 4 times daily the day after     Carboxymethylcellul-Glycerin (LUBRICATING EYE DROPS OP) Place 1 drop into both eyes daily as needed (dry eyes).     cyanocobalamin  (VITAMIN B12) 1000 MCG/ML injection Inject 1 mL (1,000 mcg total) into the muscle every 30 (thirty) days. 1 mL 0   fluticasone  (FLONASE ) 50 MCG/ACT nasal spray Place 1 spray into both nostrils daily. (Patient taking differently: Place 1 spray into both nostrils daily as needed for allergies.) 16 g 2   fluticasone -salmeterol (ADVAIR) 250-50 MCG/ACT AEPB Inhale 1 puff into the lungs in the morning and at bedtime. 180 each 1   hydrocortisone cream 1 % Apply 1 Application topically daily as needed for itching.     MAGNESIUM  GLUCONATE PO Take 1 tablet by mouth daily.     Methylcellulose, Laxative, (CITRUCEL PO) Take 1-2 tablets by mouth daily as needed (constipation).     Multiple Vitamins-Minerals (PRESERVISION AREDS 2) CAPS Take 1 capsule by mouth 2 (two) times daily.     neomycin -bacitracin -polymyxin (NEOSPORIN) OINT Apply 1 Application topically as needed for wound care.     omeprazole  (PRILOSEC) 40 MG capsule TAKE 1 CAPSULE DAILY 90 capsule 3   potassium chloride  SA (KLOR-CON  M) 20 MEQ tablet TAKE 1 TABLET DAILY 90 tablet 3   pravastatin  (PRAVACHOL ) 40 MG tablet TAKE 1 TABLET AT BEDTIME 90 tablet 3   valsartan  (DIOVAN ) 160 MG tablet TAKE 1 TABLET DAILY 90 tablet 3   XARELTO  20 MG TABS tablet TAKE 1 TABLET DAILY 90 tablet 3   No  facility-administered medications prior to visit.    Review of Systems  Review of Systems  Constitutional: Negative.   Respiratory: Negative.     Physical Exam  There were no vitals taken for this visit. Physical Exam Constitutional:      Appearance: Normal appearance. He is well-developed.  HENT:     Head: Normocephalic and atraumatic.     Mouth/Throat:     Mouth: Mucous membranes are moist.     Pharynx: Oropharynx is clear.  Cardiovascular:  Rate and Rhythm: Normal rate and regular rhythm.     Heart sounds: Normal heart sounds.  Pulmonary:     Effort: Pulmonary effort is normal. No respiratory distress.     Breath sounds: Normal breath sounds. No wheezing or rhonchi.  Musculoskeletal:        General: Normal range of motion.     Cervical back: Normal range of motion and neck supple.  Skin:    General: Skin is warm and dry.     Findings: No erythema or rash.  Neurological:     General: No focal deficit present.     Mental Status: He is alert and oriented to person, place, and time. Mental status is at baseline.  Psychiatric:        Mood and Affect: Mood normal.        Behavior: Behavior normal.        Thought Content: Thought content normal.        Judgment: Judgment normal.     Lab Results:  CBC    Component Value Date/Time   WBC 3.1 (L) 03/02/2024 1439   WBC 3.9 (L) 03/27/2023 0535   RBC 4.29 03/02/2024 1439   HGB 12.6 (L) 03/02/2024 1439   HCT 37.8 (L) 03/02/2024 1439   PLT 117 (L) 03/02/2024 1439   MCV 88.1 03/02/2024 1439   MCH 29.4 03/02/2024 1439   MCHC 33.3 03/02/2024 1439   RDW 17.4 (H) 03/02/2024 1439   LYMPHSABS 0.3 (L) 03/02/2024 1439   MONOABS 0.2 03/02/2024 1439   EOSABS 0.1 03/02/2024 1439   BASOSABS 0.0 03/02/2024 1439    BMET    Component Value Date/Time   NA 139 03/02/2024 1439   NA 141 06/27/2015 0000   K 4.5 03/02/2024 1439   CL 104 03/02/2024 1439   CO2 29 03/02/2024 1439   GLUCOSE 141 (H) 03/02/2024 1439   BUN 21  03/02/2024 1439   BUN 21 06/27/2015 0000   CREATININE 1.07 03/02/2024 1439   CALCIUM 9.0 03/02/2024 1439   GFRNONAA >60 03/02/2024 1439   GFRAA >60 02/26/2020 1329    BNP    Component Value Date/Time   BNP 161.2 (H) 05/25/2021 0258    ProBNP No results found for: PROBNP  Imaging: No results found.   Assessment & Plan:   1. OBSTRUCTIVE SLEEP APNEA (Primary)  2. COPD with asthma (HCC)   Assessment and Plan Assessment & Plan Obstructive sleep apnea, stable on CPAP Obstructive sleep apnea diagnosed in 2010 with severe apneic events (58 per hour). Stable on CPAP therapy for 15 years. Recent two-week discontinuation of CPAP due to head injury and staples, during which no symptoms were reported. Weight loss noted since initial diagnosis, but recent weight gain of 8 pounds.  Current pressure 5-15cm h20 with residual AHI 0/9/hour. - Continue CPAP therapy nightly 4-6 hours   Chronic obstructive pulmonary disease and asthma, stable on inhaler therapy COPD and asthma are well-managed on current inhaler therapy with Advair, used every 12 hours. No recent exacerbations or breathing difficulties reported. Previous postnasal drip contributing to cough was resolved with Flonase , Astelin , and Mucinex . No current issues with nasal symptoms or cough. - Continue Advair 25-50mcg one puff every 12 hours. - Continue Flonase , Astelin , and Mucinex  as needed for nasal symptoms.  Recording duration: 12 minutes   Almarie LELON Ferrari, NP 07/18/2024  "

## 2024-07-19 ENCOUNTER — Encounter: Payer: Self-pay | Admitting: Primary Care

## 2024-07-19 ENCOUNTER — Ambulatory Visit: Admitting: Primary Care

## 2024-07-19 VITALS — BP 118/68 | HR 81 | Temp 97.8°F | Ht 70.0 in | Wt 233.4 lb

## 2024-07-19 DIAGNOSIS — G4733 Obstructive sleep apnea (adult) (pediatric): Secondary | ICD-10-CM | POA: Diagnosis not present

## 2024-07-19 DIAGNOSIS — J4489 Other specified chronic obstructive pulmonary disease: Secondary | ICD-10-CM

## 2024-07-19 NOTE — Patient Instructions (Signed)
  VISIT SUMMARY: Today, you came in for a follow-up visit to discuss your COPD, asthma, and sleep apnea. You reported no current breathing difficulties or recent exacerbations, and your postnasal drip-related cough has resolved. We also reviewed your sleep apnea management and recent weight changes.  YOUR PLAN: -OBSTRUCTIVE SLEEP APNEA: Obstructive sleep apnea is a condition where your breathing stops and starts repeatedly during sleep. You have been managing this with a CPAP machine for 15 years. Despite a recent two-week break due to a head injury, you did not experience any symptoms and have resumed using the CPAP. We discussed the possibility of repeating a sleep study but decided it may not be necessary since you are stable and symptom-free without the CPAP. Continue using your CPAP machine as usual.  -CHRONIC OBSTRUCTIVE PULMONARY DISEASE (COPD) AND ASTHMA: COPD and asthma are chronic conditions that cause breathing difficulties. Your conditions are well-managed with your current inhaler therapy using Advair every 12 hours. You have not had any recent breathing issues or exacerbations. Your previous cough from postnasal drip has resolved with the use of Flonase , Astelin , and Mucinex . Continue using your Advair inhaler every 12 hours and use Flonase , Astelin , and Mucinex  as needed for any nasal symptoms.  Follow-up 1 year with [facility] NP or sooner if needed

## 2024-07-21 ENCOUNTER — Encounter: Payer: Self-pay | Admitting: Podiatry

## 2024-07-21 ENCOUNTER — Ambulatory Visit (INDEPENDENT_AMBULATORY_CARE_PROVIDER_SITE_OTHER): Admitting: Podiatry

## 2024-07-21 DIAGNOSIS — D689 Coagulation defect, unspecified: Secondary | ICD-10-CM | POA: Diagnosis not present

## 2024-07-21 DIAGNOSIS — M7751 Other enthesopathy of right foot: Secondary | ICD-10-CM

## 2024-07-21 DIAGNOSIS — Q828 Other specified congenital malformations of skin: Secondary | ICD-10-CM

## 2024-07-21 MED ORDER — TRIAMCINOLONE ACETONIDE 10 MG/ML IJ SUSP
10.0000 mg | Freq: Once | INTRAMUSCULAR | Status: AC
  Administered 2024-07-21: 10 mg via INTRA_ARTICULAR

## 2024-07-24 ENCOUNTER — Telehealth: Payer: Self-pay | Admitting: Family Medicine

## 2024-07-24 ENCOUNTER — Telehealth: Payer: Self-pay

## 2024-07-24 DIAGNOSIS — L84 Corns and callosities: Secondary | ICD-10-CM

## 2024-07-24 NOTE — Telephone Encounter (Signed)
 I did the referral

## 2024-07-24 NOTE — Telephone Encounter (Signed)
 Copied from CRM [id number]. Topic: Referral - Question >> Jul 19, 2024 12:56 PM Anairis L wrote: Reason for CRM: Patient following up on Left foot corn he is having a issue with. Is requesting a podiatrist.

## 2024-07-24 NOTE — Progress Notes (Signed)
 Subjective:   Patient ID: Guy Moore, male   DOB: 89 y.o.   MRN: [id number]   HPI Patient states he has got a lot of pain underneath his right foot and feels like he is walking on fluid.  Patient does not smoke and tries to be active   Review of Systems  All other systems reviewed and are negative.       Objective:  Physical Exam Vitals and nursing note reviewed.  Constitutional:      Appearance: He is well-developed.  Pulmonary:     Effort: Pulmonary effort is normal.  Musculoskeletal:        General: Normal range of motion.  Skin:    General: Skin is warm.  Neurological:     Mental Status: He is alert.     Neurovascular status intact muscle strength adequate range of motion adequate with inflammation fluid around the fifth MPJ right with lesion formation and pain with pressure.  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

## 2024-07-24 NOTE — Telephone Encounter (Signed)
 error

## 2024-07-25 ENCOUNTER — Encounter (INDEPENDENT_AMBULATORY_CARE_PROVIDER_SITE_OTHER): Admitting: Ophthalmology

## 2024-07-25 DIAGNOSIS — H43813 Vitreous degeneration, bilateral: Secondary | ICD-10-CM | POA: Diagnosis not present

## 2024-07-25 DIAGNOSIS — H35033 Hypertensive retinopathy, bilateral: Secondary | ICD-10-CM

## 2024-07-25 DIAGNOSIS — D3131 Benign neoplasm of right choroid: Secondary | ICD-10-CM

## 2024-07-25 DIAGNOSIS — H353231 Exudative age-related macular degeneration, bilateral, with active choroidal neovascularization: Secondary | ICD-10-CM | POA: Diagnosis not present

## 2024-07-25 DIAGNOSIS — I10 Essential (primary) hypertension: Secondary | ICD-10-CM

## 2024-07-27 ENCOUNTER — Ambulatory Visit (INDEPENDENT_AMBULATORY_CARE_PROVIDER_SITE_OTHER)

## 2024-07-27 DIAGNOSIS — Z23 Encounter for immunization: Secondary | ICD-10-CM

## 2024-07-27 DIAGNOSIS — E538 Deficiency of other specified B group vitamins: Secondary | ICD-10-CM | POA: Diagnosis not present

## 2024-07-27 MED ORDER — CYANOCOBALAMIN 1000 MCG/ML IJ SOLN
1000.0000 ug | Freq: Once | INTRAMUSCULAR | Status: AC
  Administered 2024-07-27: 1000 ug via INTRAMUSCULAR

## 2024-07-27 NOTE — Telephone Encounter (Signed)
 Spoke with pt states that he has already seen podiatrist

## 2024-07-27 NOTE — Progress Notes (Signed)
 Patient is in office today for a nurse visit for B12 Injection. Patient Injection was given in the  Left deltoid. Patient tolerated injection well.

## 2024-08-07 DIAGNOSIS — L988 Other specified disorders of the skin and subcutaneous tissue: Secondary | ICD-10-CM | POA: Diagnosis not present

## 2024-08-07 DIAGNOSIS — D485 Neoplasm of uncertain behavior of skin: Secondary | ICD-10-CM | POA: Diagnosis not present

## 2024-08-07 DIAGNOSIS — C44311 Basal cell carcinoma of skin of nose: Secondary | ICD-10-CM | POA: Diagnosis not present

## 2024-08-07 DIAGNOSIS — L720 Epidermal cyst: Secondary | ICD-10-CM | POA: Diagnosis not present

## 2024-08-07 DIAGNOSIS — L570 Actinic keratosis: Secondary | ICD-10-CM | POA: Diagnosis not present

## 2024-08-14 ENCOUNTER — Ambulatory Visit (INDEPENDENT_AMBULATORY_CARE_PROVIDER_SITE_OTHER): Payer: Medicare Other

## 2024-08-14 DIAGNOSIS — I4819 Other persistent atrial fibrillation: Secondary | ICD-10-CM | POA: Diagnosis not present

## 2024-08-15 LAB — CUP PACEART REMOTE DEVICE CHECK
Battery Remaining Longevity: 75 mo
Battery Voltage: 2.97 V
Brady Statistic AP VP Percent: 0 %
Brady Statistic AP VS Percent: 0 %
Brady Statistic AS VP Percent: 85.25 %
Brady Statistic AS VS Percent: 14.75 %
Brady Statistic RA Percent Paced: 0 %
Brady Statistic RV Percent Paced: 85.25 %
Date Time Interrogation Session: 20251103015457
Implantable Lead Connection Status: 753985
Implantable Lead Connection Status: 753985
Implantable Lead Implant Date: 20180124
Implantable Lead Implant Date: 20180124
Implantable Lead Location: 753859
Implantable Lead Location: 753860
Implantable Lead Model: 5076
Implantable Lead Model: 5076
Implantable Pulse Generator Implant Date: 20180124
Lead Channel Impedance Value: 323 Ohm
Lead Channel Impedance Value: 342 Ohm
Lead Channel Impedance Value: 399 Ohm
Lead Channel Impedance Value: 456 Ohm
Lead Channel Pacing Threshold Amplitude: 0.625 V
Lead Channel Pacing Threshold Pulse Width: 0.4 ms
Lead Channel Sensing Intrinsic Amplitude: 0.25 mV
Lead Channel Sensing Intrinsic Amplitude: 0.25 mV
Lead Channel Sensing Intrinsic Amplitude: 6.5 mV
Lead Channel Sensing Intrinsic Amplitude: 6.5 mV
Lead Channel Setting Pacing Amplitude: 1.5 V
Lead Channel Setting Pacing Pulse Width: 0.4 ms
Lead Channel Setting Sensing Sensitivity: 0.9 mV
Zone Setting Status: 755011

## 2024-08-16 NOTE — Progress Notes (Signed)
 Remote PPM Transmission

## 2024-08-21 DIAGNOSIS — G4733 Obstructive sleep apnea (adult) (pediatric): Secondary | ICD-10-CM | POA: Diagnosis not present

## 2024-08-21 DIAGNOSIS — Z9989 Dependence on other enabling machines and devices: Secondary | ICD-10-CM | POA: Diagnosis not present

## 2024-08-21 DIAGNOSIS — Z7901 Long term (current) use of anticoagulants: Secondary | ICD-10-CM | POA: Diagnosis not present

## 2024-08-21 DIAGNOSIS — I4891 Unspecified atrial fibrillation: Secondary | ICD-10-CM | POA: Diagnosis not present

## 2024-08-21 DIAGNOSIS — C44311 Basal cell carcinoma of skin of nose: Secondary | ICD-10-CM | POA: Diagnosis not present

## 2024-08-21 DIAGNOSIS — M950 Acquired deformity of nose: Secondary | ICD-10-CM | POA: Diagnosis not present

## 2024-08-22 ENCOUNTER — Ambulatory Visit: Payer: Self-pay | Admitting: Cardiology

## 2024-08-23 ENCOUNTER — Telehealth: Payer: Self-pay | Admitting: Primary Care

## 2024-08-23 ENCOUNTER — Telehealth: Payer: Self-pay | Admitting: Cardiology

## 2024-08-23 ENCOUNTER — Other Ambulatory Visit: Payer: Self-pay | Admitting: Family Medicine

## 2024-08-23 ENCOUNTER — Other Ambulatory Visit: Payer: Self-pay

## 2024-08-23 MED ORDER — FLUTICASONE-SALMETEROL 250-50 MCG/ACT IN AEPB
1.0000 | INHALATION_SPRAY | Freq: Two times a day (BID) | RESPIRATORY_TRACT | 2 refills | Status: AC
Start: 2024-08-23 — End: ?

## 2024-08-23 NOTE — Telephone Encounter (Unsigned)
 Copied from CRM [id number]. Topic: Clinical - Medication Refill >> Aug 23, 2024  8:56 AM Joesph PARAS wrote: Medication: fluticasone -salmeterol (ADVAIR) 250-50 MCG/ACT AEPB or Wixela  Has the patient contacted their pharmacy? Yes (Agent: If no, request that the patient contact the pharmacy for the refill. If patient does not wish to contact the pharmacy document the reason why and proceed with request.) (Agent: If yes, when and what did the pharmacy advise?)  This is the patient's preferred pharmacy:  [facility] - demanding three month supply  Is this the correct pharmacy for this prescription? Yes If no, delete pharmacy and type the correct one.   Has the prescription been filled recently? No  Is the patient out of the medication? Yes  Has the patient been seen for an appointment in the last year OR does the patient have an upcoming appointment? Yes  Can we respond through MyChart? Yes  Agent: Please be advised that Rx refills may take up to 3 business days. We ask that you follow-up with your pharmacy.

## 2024-08-23 NOTE — Telephone Encounter (Signed)
 Rx sent to pharmacy

## 2024-08-23 NOTE — Telephone Encounter (Signed)
   Pre-operative Risk Assessment    Patient Name: Guy Moore  DOB: [birth year] MRN: [id number]   Date of last office visit: 02/22/24 Date of next office visit: TBD   Request for Surgical Clearance    Procedure:  repair of mohs defect left nose possible paramedian forehead flap possible nasal nasolabial flap possible skingraft from ear/forehead possible ear cartilage graft adjacent tissue transfer nose excision wound head   Date of Surgery:  Clearance TBD                                Surgeon:  Dr. Elspeth Coddington  Surgeon's Group or Practice Name:  [facility]  Phone number:  [phone] Fax number:  [phone]    Type of Clearance Requested:   - Medical  - Pharmacy:  Hold defer to cardiologist       Type of Anesthesia:  MAC   Additional requests/questions:    SignedLarraine Salt   08/23/2024, 9:59 AM

## 2024-08-24 ENCOUNTER — Telehealth (HOSPITAL_BASED_OUTPATIENT_CLINIC_OR_DEPARTMENT_OTHER): Payer: Self-pay | Admitting: *Deleted

## 2024-08-24 NOTE — Telephone Encounter (Signed)
 Pt has been scheduled tele preop appt 08/31/24. Med rec and consent are done.

## 2024-08-24 NOTE — Telephone Encounter (Signed)
 Pt has been scheduled tele preop appt 08/31/24. Med rec and consent are done.      Patient Consent for Virtual Visit        Guy Moore has provided verbal consent on 08/24/2024 for a virtual visit (video or telephone).   CONSENT FOR VIRTUAL VISIT FOR:  Guy Moore  By participating in this virtual visit I agree to the following:  I hereby voluntarily request, consent and authorize [facility] and its employed or contracted physicians, physician assistants, nurse practitioners or other licensed health care professionals (the Practitioner), to provide me with telemedicine health care services (the "Services) as deemed necessary by the treating Practitioner. I acknowledge and consent to receive the Services by the Practitioner via telemedicine. I understand that the telemedicine visit will involve communicating with the Practitioner through live audiovisual communication technology and the disclosure of certain medical information by electronic transmission. I acknowledge that I have been given the opportunity to request an in-person assessment or other available alternative prior to the telemedicine visit and am voluntarily participating in the telemedicine visit.  I understand that I have the right to withhold or withdraw my consent to the use of telemedicine in the course of my care at any time, without affecting my right to future care or treatment, and that the Practitioner or I may terminate the telemedicine visit at any time. I understand that I have the right to inspect all information obtained and/or recorded in the course of the telemedicine visit and may receive copies of available information for a reasonable fee.  I understand that some of the potential risks of receiving the Services via telemedicine include:  Delay or interruption in medical evaluation due to technological equipment failure or disruption; Information transmitted may not be sufficient (e.g. poor  resolution of images) to allow for appropriate medical decision making by the Practitioner; and/or  In rare instances, security protocols could fail, causing a breach of personal health information.  Furthermore, I acknowledge that it is my responsibility to provide information about my medical history, conditions and care that is complete and accurate to the best of my ability. I acknowledge that Practitioner's advice, recommendations, and/or decision may be based on factors not within their control, such as incomplete or inaccurate data provided by me or distortions of diagnostic images or specimens that may result from electronic transmissions. I understand that the practice of medicine is not an exact science and that Practitioner makes no warranties or guarantees regarding treatment outcomes. I acknowledge that a copy of this consent can be made available to me via my patient portal [facility] MyChart), or I can request a printed copy by calling the office of [facility].    I understand that my insurance will be billed for this visit.   I have read or had this consent read to me. I understand the contents of this consent, which adequately explains the benefits and risks of the Services being provided via telemedicine.  I have been provided ample opportunity to ask questions regarding this consent and the Services and have had my questions answered to my satisfaction. I give my informed consent for the services to be provided through the use of telemedicine in my medical care

## 2024-08-24 NOTE — Telephone Encounter (Signed)
   Name: Guy Moore  DOB: [birth year]  MRN: [id number]  Primary Cardiologist: None   Preoperative team, please contact this patient and set up a phone call appointment for further preoperative risk assessment. Please obtain consent and complete medication review. Thank you for your help.  I confirm that guidance regarding antiplatelet and oral anticoagulation therapy has been completed and, if necessary, noted below.  Patient's Xarelto  was prescribed by his PCP.  Recommendations for holding anticoagulant will need to come from prescribing provider.  I also confirmed the patient resides in the state of [place] . As per [facility] Medical Board telemedicine laws, the patient must reside in the state in which the provider is licensed.   [name], NP 08/24/2024, 12:55 PM [facility]

## 2024-08-29 ENCOUNTER — Encounter (INDEPENDENT_AMBULATORY_CARE_PROVIDER_SITE_OTHER): Admitting: Ophthalmology

## 2024-08-29 DIAGNOSIS — H43813 Vitreous degeneration, bilateral: Secondary | ICD-10-CM | POA: Diagnosis not present

## 2024-08-29 DIAGNOSIS — D3131 Benign neoplasm of right choroid: Secondary | ICD-10-CM

## 2024-08-29 DIAGNOSIS — H35033 Hypertensive retinopathy, bilateral: Secondary | ICD-10-CM | POA: Diagnosis not present

## 2024-08-29 DIAGNOSIS — I10 Essential (primary) hypertension: Secondary | ICD-10-CM

## 2024-08-29 DIAGNOSIS — H353231 Exudative age-related macular degeneration, bilateral, with active choroidal neovascularization: Secondary | ICD-10-CM

## 2024-08-29 NOTE — Telephone Encounter (Signed)
 Beth, This is just so you know why he is not using his CPAP:  This for your information. I have a Basal Cell Carcinoma on my nose. So if you see the cpap off on my air the ent surgeon said it would be necessary for about two weeks. I left it off for night but slept 7 hours and no discomfort so should be ok. There will be two surgeries one mohs to remove it and one to reconstruct my nose

## 2024-08-30 ENCOUNTER — Inpatient Hospital Stay: Attending: Internal Medicine

## 2024-08-30 ENCOUNTER — Ambulatory Visit

## 2024-08-30 ENCOUNTER — Inpatient Hospital Stay: Admitting: Internal Medicine

## 2024-08-30 VITALS — BP 142/72 | HR 92 | Temp 97.8°F | Resp 17 | Ht 70.0 in | Wt 234.0 lb

## 2024-08-30 DIAGNOSIS — D72819 Decreased white blood cell count, unspecified: Secondary | ICD-10-CM | POA: Diagnosis not present

## 2024-08-30 DIAGNOSIS — E538 Deficiency of other specified B group vitamins: Secondary | ICD-10-CM | POA: Diagnosis not present

## 2024-08-30 DIAGNOSIS — D7589 Other specified diseases of blood and blood-forming organs: Secondary | ICD-10-CM | POA: Diagnosis not present

## 2024-08-30 DIAGNOSIS — D539 Nutritional anemia, unspecified: Secondary | ICD-10-CM

## 2024-08-30 DIAGNOSIS — D696 Thrombocytopenia, unspecified: Secondary | ICD-10-CM | POA: Diagnosis not present

## 2024-08-30 DIAGNOSIS — D649 Anemia, unspecified: Secondary | ICD-10-CM

## 2024-08-30 LAB — CBC WITH DIFFERENTIAL (CANCER CENTER ONLY)
Abs Immature Granulocytes: 0.01 K/uL (ref 0.00–0.07)
Basophils Absolute: 0.1 K/uL (ref 0.0–0.1)
Basophils Relative: 1 %
Eosinophils Absolute: 0.1 K/uL (ref 0.0–0.5)
Eosinophils Relative: 2 %
HCT: 39.3 % (ref 39.0–52.0)
Hemoglobin: 12.9 g/dL — ABNORMAL LOW (ref 13.0–17.0)
Immature Granulocytes: 0 %
Lymphocytes Relative: 15 %
Lymphs Abs: 0.5 K/uL — ABNORMAL LOW (ref 0.7–4.0)
MCH: 27.6 pg (ref 26.0–34.0)
MCHC: 32.8 g/dL (ref 30.0–36.0)
MCV: 84.2 fL (ref 80.0–100.0)
Monocytes Absolute: 0.3 K/uL (ref 0.1–1.0)
Monocytes Relative: 9 %
Neutro Abs: 2.6 K/uL (ref 1.7–7.7)
Neutrophils Relative %: 73 %
Platelet Count: 133 K/uL — ABNORMAL LOW (ref 150–400)
RBC: 4.67 MIL/uL (ref 4.22–5.81)
RDW: 16.5 % — ABNORMAL HIGH (ref 11.5–15.5)
WBC Count: 3.6 K/uL — ABNORMAL LOW (ref 4.0–10.5)
nRBC: 0 % (ref 0.0–0.2)

## 2024-08-30 LAB — FERRITIN: Ferritin: 34 ng/mL (ref 24–336)

## 2024-08-30 LAB — CMP (CANCER CENTER ONLY)
ALT: 19 U/L (ref 0–44)
AST: 28 U/L (ref 15–41)
Albumin: 4.3 g/dL (ref 3.5–5.0)
Alkaline Phosphatase: 69 U/L (ref 38–126)
Anion gap: 12 (ref 5–15)
BUN: 25 mg/dL — ABNORMAL HIGH (ref 8–23)
CO2: 26 mmol/L (ref 22–32)
Calcium: 9.3 mg/dL (ref 8.9–10.3)
Chloride: 100 mmol/L (ref 98–111)
Creatinine: 1.11 mg/dL (ref 0.61–1.24)
GFR, Estimated: >60 mL/min (ref >60)
Glucose, Bld: 132 mg/dL — ABNORMAL HIGH (ref 70–99)
Potassium: 5 mmol/L (ref 3.5–5.1)
Sodium: 138 mmol/L (ref 135–145)
Total Bilirubin: 0.6 mg/dL (ref 0.0–1.2)
Total Protein: 6.6 g/dL (ref 6.5–8.1)

## 2024-08-30 LAB — VITAMIN B12: Vitamin B-12: 591 pg/mL (ref 180–914)

## 2024-08-30 LAB — LACTATE DEHYDROGENASE: LDH: 160 U/L (ref 105–235)

## 2024-08-30 NOTE — Progress Notes (Signed)
 [facility] [phone]   [phone]  OFFICE PROGRESS NOTE  Guy Moore LABOR, MD [address] 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  Bone marrow biopsy and aspirate performed in November 2021 showed hypercellular marrow with trilineage hematopoiesis and minimal dyspoiesis.   PRIOR THERAPY: None   CURRENT THERAPY:  Vitamin B12 injection on a monthly basis  INTERVAL HISTORY: Guy Moore 89 y.o. male returns to the clinic today for 42-month follow-up visit. Discussed the use of AI scribe software for clinical note transcription with the patient, who gave verbal consent to proceed.  History of Present Illness Guy Moore is an 89 year old male who presents for evaluation of intermittent bicytopenia and repeat blood work.  He has experienced intermittent bicytopenia, with recent improvement in blood counts. He is on a monthly vitamin B12 injection regimen.  He is being evaluated by a dermatologist for a basal cell carcinoma on the left side of his nose. The lesion was biopsied and diagnosed, and he has been referred to a specialist for surgical excision and reconstruction due to its proximity to his nose. He feels anxious about the upcoming procedures, which will involve local anesthesia for the excision and general anesthesia for the reconstruction. He previously had a basal cell carcinoma on his arm that was successfully treated with surgery.  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 The last ultrasound was almost a year and a half ago, and Doctor Alvia said that it showed the lesion to be very stable with very little growth.    MEDICAL HISTORY: Past Medical History:  Diagnosis Date   Allergic rhinitis    Arthritis    Asthma    Atrial fibrillation [facility])    sees Dr. 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 Carolynne Allan    Diverticulosis    Dysrhythmia    afib, flutter and tachycardia   Erosive esophagitis    Esophageal stricture    Gastric ulcer    Hearing loss    uses amplification   Hemorrhoids    Hiatal hernia    History of kidney stones    HTN (hypertension)    Hyperlipidemia    Knee problem    2% permanent partial impairment Right   Macular degeneration    Perineal abscess    Pneumonia    Pre-diabetes    Presence of permanent cardiac pacemaker    Sleep apnea    cpap    ALLERGIES:  has no known allergies.  MEDICATIONS:  Current Outpatient Medications  Medication Sig Dispense Refill   albuterol  (PROVENTIL ) (2.5 MG/3ML) 0.083% nebulizer solution Take 3 mLs (2.5 mg total) by nebulization every 4 (four) hours as needed for wheezing or shortness of breath. 75 mL 5   albuterol  (VENTOLIN  HFA) 108 (90 Base) MCG/ACT inhaler Inhale 2 puffs into the lungs every 4 (four) hours as needed for wheezing or shortness of breath. 18 g 11   amLODipine  (NORVASC ) 5 MG tablet TAKE 1 TABLET DAILY 90 tablet 3   azelastine  (ASTELIN ) 0.1 % nasal spray Place 1 spray into both  nostrils 2 (two) times daily. Use in each nostril as directed 30 mL 1   BESIVANCE 0.6 % SUSP Place 1 drop into both eyes See admin instructions. Instill 1 drop into both eyes 3 times daily on the day of monthly eye injections then 4 times daily the day after     Carboxymethylcellul-Glycerin (LUBRICATING EYE DROPS OP) Place 1 drop into both eyes daily as needed (dry eyes).     cyanocobalamin  (VITAMIN B12) 1000 MCG/ML injection Inject 1 mL (1,000 mcg total) into the muscle every 30 (thirty) days. 1 mL 0   fluticasone  (FLONASE ) 50 MCG/ACT nasal spray Place 1 spray into both nostrils daily. 16 g 2   fluticasone -salmeterol (ADVAIR) 250-50  MCG/ACT AEPB Inhale 1 puff into the lungs in the morning and at bedtime. 180 each 2   hydrocortisone cream 1 % Apply 1 Application topically daily as needed for itching.     MAGNESIUM  GLUCONATE PO Take 1 tablet by mouth daily.     Methylcellulose, Laxative, (CITRUCEL PO) Take 1-2 tablets by mouth daily as needed (constipation).     Multiple Vitamins-Minerals (PRESERVISION AREDS 2) CAPS Take 1 capsule by mouth 2 (two) times daily.     neomycin -bacitracin -polymyxin (NEOSPORIN) OINT Apply 1 Application topically as needed for wound care.     omeprazole  (PRILOSEC) 40 MG capsule TAKE 1 CAPSULE DAILY 90 capsule 3   potassium chloride  SA (KLOR-CON  M) 20 MEQ tablet TAKE 1 TABLET DAILY 90 tablet 3   pravastatin  (PRAVACHOL ) 40 MG tablet TAKE 1 TABLET AT BEDTIME 90 tablet 0   valsartan  (DIOVAN ) 160 MG tablet TAKE 1 TABLET DAILY 90 tablet 3   XARELTO  20 MG TABS tablet TAKE 1 TABLET DAILY 90 tablet 3   No current facility-administered medications for this visit.    SURGICAL HISTORY:  Past Surgical History:  Procedure Laterality Date   CARDIAC ELECTROPHYSIOLOGY STUDY AND ABLATION     x 4 (sees [facility]. Belvie Linger at [facility])   CO2 LASER APPLICATION N/A 03/26/2023   Procedure: CO2 LASER ABLATION OF PENIS;  Surgeon: Selma Donnice SAUNDERS, MD;  Location: [facility];  Service: Urology;  Laterality: N/A;   CYST EXCISION PERINEAL     CYSTOSCOPY WITH URETEROSCOPY AND STENT PLACEMENT Right 10/29/2022   Procedure: CYSTOSCOPY WITH RETROGRADE WITH URETHERAL DILITATIONRIGHT DIAGNOSTIC  URETEROSCOPY AND STENT PLACEMENT;  Surgeon: Selma Donnice SAUNDERS, MD;  Location: [facility];  Service: Urology;  Laterality: Right;   CYSTOSCOPY/URETEROSCOPY/HOLMIUM LASER/STENT PLACEMENT Right 11/27/2022   Procedure: CYSTOSCOPY/ RIGHT RETROGRADE PYELOGRAM/RIGHT HOLMIUM LASER/RIGHT STENT PLACEMENT/BASKET;  Surgeon: Selma Donnice SAUNDERS, MD;  Location: [facility];  Service: Urology;  Laterality: Right;  60 MINUTES NEEDED FOR CASE   INSERT / REPLACE / REMOVE  PACEMAKER     NOSE SURGERY     septoplasty   PENILE BIOPSY N/A 11/27/2022   Procedure: PENILE BIOPSY;  Surgeon: Selma Donnice SAUNDERS, MD;  [facility];  Service: Urology;  Laterality: N/A;   TRANSURETHRAL RESECTION OF PROSTATE N/A 03/26/2023   Procedure: BIPOLAR TRANSURETHRAL RESECTION OF THE PROSTATE (TURP);  Surgeon: Selma Donnice SAUNDERS, MD;  [facility]: [facility];  Service: Urology;  Laterality: N/A;  90 MINUTES NEEDED FOR CASE   UVULOPALATOPHARYNGOPLASTY  06/13/1979   VASECTOMY  06/12/1969   w/complications    REVIEW OF SYSTEMS:  A comprehensive review of systems was negative except for: Constitutional: positive for fatigue   PHYSICAL EXAMINATION: General appearance: alert, cooperative, fatigued, and no distress Head: Normocephalic, without obvious abnormality, atraumatic Neck: no adenopathy, no JVD, supple,  symmetrical, trachea midline, and thyroid  not enlarged, symmetric, no tenderness/mass/nodules Lymph nodes: Cervical, supraclavicular, and axillary nodes normal. Resp: clear to auscultation bilaterally Back: symmetric, no curvature. ROM normal. No CVA tenderness. Cardio: regular rate and rhythm, S1, S2 normal, no murmur, click, rub or gallop GI: soft, non-tender; bowel sounds normal; no masses,  no organomegaly Extremities: extremities normal, atraumatic, no cyanosis or edema  ECOG PERFORMANCE STATUS: 1 - Symptomatic but completely ambulatory  Blood pressure (!) 144/79, pulse 92, temperature 97.8 F (36.6 C), temperature source Temporal, resp. rate 17, height 5' 10 (1.778 m), weight 234 lb (106.1 kg), SpO2 96%.  LABORATORY DATA: Lab Results  Component Value Date   WBC 3.6 (L) 08/30/2024   HGB 12.9 (L) 08/30/2024   HCT 39.3 08/30/2024   MCV 84.2 08/30/2024   PLT 133 (L) 08/30/2024      Chemistry      Component Value Date/Time   NA 139 03/02/2024 1439   NA 141 06/27/2015 0000   K 4.5 03/02/2024 1439   CL 104 03/02/2024 1439   CO2 29 03/02/2024 1439   BUN 21 03/02/2024 1439    BUN 21 06/27/2015 0000   CREATININE 1.07 03/02/2024 1439   GLU 121 06/27/2015 0000      Component Value Date/Time   CALCIUM 9.0 03/02/2024 1439   ALKPHOS 53 03/02/2024 1439   AST 20 03/02/2024 1439   ALT 21 03/02/2024 1439   BILITOT 0.8 03/02/2024 1439       RADIOGRAPHIC STUDIES: CUP PACEART REMOTE DEVICE CHECK Result Date: 08/15/2024 PPM Scheduled remote reviewed. Normal device function.  Presenting rhythm: VP, VVIR. Next remote transmission per protocol. - CS, CVRS   ASSESSMENT AND PLAN: This is a very pleasant 89 years old white male with bicytopenia including leukocytopenia and thrombocytopenia likely secondary to iron deficiency but early MDS could not be completely excluded. The patient had a bone marrow biopsy and aspirate performed recently that was nonspecific but again could not rule out early myelodysplastic syndrome. The patient is currently on observation.  Repeat CBC today showed mild leukocytopenia, anemia and thrombocytopenia. 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 White blood cell count increased to 3.6 from previous range of 2.5-2.6. Hemoglobin improved to 12.9. No immediate concerns regarding current blood counts. - Continue current management and monitoring. - Scheduled follow-up appointment in six months.  Vitamin B12 deficiency on replacement therapy Vitamin B12 deficiency managed with monthly injections. No new symptoms or complications reported. - Continue monthly vitamin B12 injections. The patient was advised to call immediately if he has any other concerning symptoms in the interval.  The patient voices understanding of current disease status and treatment options and is in agreement with the current care plan.  All questions were answered. The patient knows to call the clinic with any problems, questions or concerns. We can  certainly see the patient much sooner if necessary.  Disclaimer: This note was dictated with voice recognition software. Similar sounding words can inadvertently be transcribed and may not be corrected upon review.

## 2024-08-31 ENCOUNTER — Ambulatory Visit: Attending: Cardiovascular Disease | Admitting: Physician Assistant

## 2024-08-31 ENCOUNTER — Encounter: Payer: Self-pay | Admitting: Family Medicine

## 2024-08-31 DIAGNOSIS — Z01810 Encounter for preprocedural cardiovascular examination: Secondary | ICD-10-CM | POA: Diagnosis not present

## 2024-08-31 NOTE — Progress Notes (Signed)
 Virtual Visit via Telephone Note   Because of Guy Moore co-morbid illnesses, he is at least at moderate risk for complications without adequate follow up.  This format is felt to be most appropriate for this patient at this time.  Due to technical limitations with video connection web designer), today's appointment will be conducted as an audio only telehealth visit, and DAILAN PFALZGRAF verbally agreed to proceed in this manner.   All issues noted in this document were discussed and addressed.  No physical exam could be performed with this format.  Evaluation Performed:  Preoperative cardiovascular risk assessment _____________   Date:  08/31/2024   Patient ID:  Guy Moore, DOB [birth year], MRN [id number] Patient Location:  Home Provider location:   Office  Primary Care Provider:  Johnny Garnette LABOR, MD Primary Cardiologist:  None  Chief Complaint / Patient Profile   89 y.o. y/o male with a h/o h/o atrial fibrillation, hypertension, hyperlipidemia, sleep apnea, sick sinus syndrome who is pending repair of mohs defect left nose possible paramedian forehead flap possible nasal nasolabial flap possible skingraft from ear/forehead possible ear cartilage graft adjacent tissue transfer nose excision wound head and presents today for telephonic preoperative cardiovascular risk assessment.  History of Present Illness    Guy Moore is a 89 y.o. male who presents via audio/video conferencing for a telehealth visit today.  Pt was last seen in cardiology clinic on 02/22/24 by Dr. Inocencio.  At that time JANN RA was doing well.  The patient is now pending procedure as outlined above. Since his last visit, he feels fine and has not had any issues with his heart. His BP does drop a little after eating. No chest pain or SOB. No known issues with the Afib lately.   Patient's Xarelto  was prescribed by his PCP. Recommendations for holding anticoagulant will need to come from prescribing  provider (Dr. Garnette Johnny).   Past Medical History    Past Medical History:  Diagnosis Date   Allergic rhinitis    Arthritis    Asthma    Atrial fibrillation [facility])    sees Dr. Belvie Heckler at [facility]    Atrial flutter (HCC)    BCC (basal cell carcinoma of skin)    Nose   Chronic kidney disease    Complication of anesthesia    no problems per patient   COPD with emphysema (HCC)    sees Dr. Carolynne Allan    Diverticulosis    Dysrhythmia    afib, flutter and tachycardia   Erosive esophagitis    Esophageal stricture    Gastric ulcer    Hearing loss    uses amplification   Hemorrhoids    Hiatal hernia    History of kidney stones    HTN (hypertension)    Hyperlipidemia    Knee problem    2% permanent partial impairment Right   Macular degeneration    Perineal abscess    Pneumonia    Pre-diabetes    Presence of permanent cardiac pacemaker    Sleep apnea    cpap   Past Surgical History:  Procedure Laterality Date   CARDIAC ELECTROPHYSIOLOGY STUDY AND ABLATION     x 4 (sees Dr. Belvie Linger at [facility])   CO2 LASER APPLICATION N/A 03/26/2023   Procedure: CO2 LASER ABLATION OF PENIS;  Surgeon: Selma Donnice SAUNDERS, MD;  [facility];  Service: Urology;  Laterality: N/A;   CYST EXCISION PERINEAL  CYSTOSCOPY WITH URETEROSCOPY AND STENT PLACEMENT Right 10/29/2022   Procedure: CYSTOSCOPY WITH RETROGRADE WITH URETHERAL DILITATIONRIGHT DIAGNOSTIC  URETEROSCOPY AND STENT PLACEMENT;  Surgeon: Selma Donnice SAUNDERS, MD;  [facility];  Service: Urology;  Laterality: Right;   CYSTOSCOPY/URETEROSCOPY/HOLMIUM LASER/STENT PLACEMENT Right 11/27/2022   Procedure: CYSTOSCOPY/ RIGHT RETROGRADE PYELOGRAM/RIGHT HOLMIUM LASER/RIGHT STENT PLACEMENT/BASKET;  Surgeon: Selma Donnice SAUNDERS, MD;  [facility];  Service: Urology;  Laterality: Right;  60 MINUTES NEEDED FOR CASE   INSERT / REPLACE / REMOVE PACEMAKER     NOSE SURGERY     septoplasty   PENILE BIOPSY N/A 11/27/2022    Procedure: PENILE BIOPSY;  Surgeon: Selma Donnice SAUNDERS, MD;  [facility];  Service: Urology;  Laterality: N/A;   TRANSURETHRAL RESECTION OF PROSTATE N/A 03/26/2023   Procedure: BIPOLAR TRANSURETHRAL RESECTION OF THE PROSTATE (TURP);  Surgeon: Selma Donnice SAUNDERS, MD;  [facility];  Service: Urology;  Laterality: N/A;  90 MINUTES NEEDED FOR CASE   UVULOPALATOPHARYNGOPLASTY  06/13/1979   VASECTOMY  06/12/1969   w/complications    Allergies  No Known Allergies  Home Medications    Prior to Admission medications   Medication Sig Start Date End Date Taking? Authorizing Provider  albuterol  (PROVENTIL ) (2.5 MG/3ML) 0.083% nebulizer solution Take 3 mLs (2.5 mg total) by nebulization every 4 (four) hours as needed for wheezing or shortness of breath. 11/11/23   Johnny Garnette LABOR, MD  albuterol  (VENTOLIN  HFA) 108 [phone] Base) MCG/ACT inhaler Inhale 2 puffs into the lungs every 4 (four) hours as needed for wheezing or shortness of breath. 11/11/23   Johnny Garnette LABOR, MD  amLODipine  (NORVASC ) 5 MG tablet TAKE 1 TABLET DAILY 11/26/23   Johnny Garnette LABOR, MD  azelastine  (ASTELIN ) 0.1 % nasal spray Place 1 spray into both nostrils 2 (two) times daily. Use in each nostril as directed 02/09/24   Hope Almarie ORN, NP  BESIVANCE 0.6 % SUSP Place 1 drop into both eyes See admin instructions. Instill 1 drop into both eyes 3 times daily on the day of monthly eye injections then 4 times daily the day after 07/24/20   [provider]  Carboxymethylcellul-Glycerin (LUBRICATING EYE DROPS OP) Place 1 drop into both eyes daily as needed (dry eyes).    [provider]  cyanocobalamin  (VITAMIN B12) 1000 MCG/ML injection Inject 1 mL (1,000 mcg total) into the muscle every 30 (thirty) days. 11/30/22   Johnny Garnette LABOR, MD  fluticasone  (FLONASE ) 50 MCG/ACT nasal spray Place 1 spray into both nostrils daily. 02/04/23   Sood, Vineet, MD  fluticasone -salmeterol (ADVAIR) 250-50 MCG/ACT AEPB Inhale 1 puff into the lungs in  the morning and at bedtime. 08/23/24   Hope Almarie ORN, NP  hydrocortisone cream 1 % Apply 1 Application topically daily as needed for itching.    [provider]  MAGNESIUM  GLUCONATE PO Take 1 tablet by mouth daily.    [provider]  Methylcellulose, Laxative, (CITRUCEL PO) Take 1-2 tablets by mouth daily as needed (constipation).    [provider]  Multiple Vitamins-Minerals (PRESERVISION AREDS 2) CAPS Take 1 capsule by mouth 2 (two) times daily.    [provider]  neomycin -bacitracin -polymyxin (NEOSPORIN) OINT Apply 1 Application topically as needed for wound care.    [provider]  omeprazole  (PRILOSEC) 40 MG capsule TAKE 1 CAPSULE DAILY 02/22/23   Johnny Garnette LABOR, MD  potassium chloride  SA (KLOR-CON  M) 20 MEQ tablet TAKE 1 TABLET DAILY 09/07/23   Johnny Garnette LABOR, MD  pravastatin  (PRAVACHOL )  40 MG tablet TAKE 1 TABLET AT BEDTIME 08/23/24   Johnny Garnette LABOR, MD  valsartan  (DIOVAN ) 160 MG tablet TAKE 1 TABLET DAILY 01/25/24   Johnny Garnette LABOR, MD  XARELTO  20 MG TABS tablet TAKE 1 TABLET DAILY 03/08/24   Johnny Garnette LABOR, MD    Physical Exam    Vital Signs:  EVERARDO VORIS does not have vital signs available for review today.  Given telephonic nature of communication, physical exam is limited. AAOx3. NAD. Normal affect.  Speech and respirations are unlabored.  Accessory Clinical Findings    None  Assessment & Plan    1.  Preoperative Cardiovascular Risk Assessment:  Mr. Mcgann perioperative risk of a major cardiac event is 0.4% according to the Revised Cardiac Risk Index (RCRI).  Therefore, he is at low risk for perioperative complications.   His functional capacity is good at 5.62 METs according to the Duke Activity Status Index (DASI). Recommendations: According to ACC/AHA guidelines, no further cardiovascular testing needed.  The patient may proceed to surgery at acceptable risk.    The patient was advised that if he develops new  symptoms prior to surgery to contact our office to arrange for a follow-up visit, and he verbalized understanding.  A copy of this note will be routed to requesting surgeon.  Time:   Today, I have spent 14 minutes with the patient with telehealth technology discussing medical history, symptoms, and management plan.     Orren LOISE Fabry, PA-C  08/31/2024, 2:27 PM

## 2024-08-31 NOTE — Telephone Encounter (Signed)
 Noted, thank you. If unble to use CPAP focus on side sleeping position or get wedge pillow to elevate head. Avoid alcohol before bed or other sedatives. Resume CPAP when able

## 2024-09-01 ENCOUNTER — Ambulatory Visit

## 2024-09-01 DIAGNOSIS — E538 Deficiency of other specified B group vitamins: Secondary | ICD-10-CM

## 2024-09-01 MED ORDER — CYANOCOBALAMIN 1000 MCG/ML IJ SOLN
1000.0000 ug | Freq: Once | INTRAMUSCULAR | Status: AC
  Administered 2024-09-01: 1000 ug via INTRAMUSCULAR

## 2024-09-01 NOTE — Progress Notes (Signed)
 Patient is in office today for a nurse visit for B12 Injection. Patient Injection was given in the  Left deltoid. Patient tolerated injection well.

## 2024-09-04 ENCOUNTER — Other Ambulatory Visit

## 2024-09-04 ENCOUNTER — Ambulatory Visit: Admitting: Internal Medicine

## 2024-09-04 NOTE — Telephone Encounter (Signed)
 Sounds good. I did not think we needed to hold it

## 2024-09-19 DIAGNOSIS — C44311 Basal cell carcinoma of skin of nose: Secondary | ICD-10-CM | POA: Diagnosis not present

## 2024-09-20 ENCOUNTER — Encounter: Payer: Self-pay | Admitting: Family Medicine

## 2024-09-21 ENCOUNTER — Other Ambulatory Visit: Payer: Self-pay

## 2024-09-21 ENCOUNTER — Other Ambulatory Visit: Payer: Self-pay | Admitting: Family Medicine

## 2024-09-21 DIAGNOSIS — K219 Gastro-esophageal reflux disease without esophagitis: Secondary | ICD-10-CM

## 2024-09-21 MED ORDER — OMEPRAZOLE 40 MG PO CPDR: 40.0000 mg | DELAYED_RELEASE_CAPSULE | Freq: Every day | ORAL | 3 refills | Status: AC

## 2024-09-21 NOTE — Telephone Encounter (Signed)
 [facility]

## 2024-09-22 DIAGNOSIS — I4891 Unspecified atrial fibrillation: Secondary | ICD-10-CM | POA: Diagnosis not present

## 2024-09-22 DIAGNOSIS — C44311 Basal cell carcinoma of skin of nose: Secondary | ICD-10-CM | POA: Diagnosis not present

## 2024-09-22 DIAGNOSIS — Z950 Presence of cardiac pacemaker: Secondary | ICD-10-CM | POA: Diagnosis not present

## 2024-09-25 ENCOUNTER — Encounter: Payer: Self-pay | Admitting: Family Medicine

## 2024-09-25 NOTE — Telephone Encounter (Signed)
 Noted

## 2024-09-28 ENCOUNTER — Observation Stay (HOSPITAL_BASED_OUTPATIENT_CLINIC_OR_DEPARTMENT_OTHER)
Admission: EM | Admit: 2024-09-28 | Discharge: 2024-09-29 | Disposition: A | Attending: Internal Medicine | Admitting: Internal Medicine

## 2024-09-28 ENCOUNTER — Emergency Department (HOSPITAL_BASED_OUTPATIENT_CLINIC_OR_DEPARTMENT_OTHER): Admitting: Radiology

## 2024-09-28 ENCOUNTER — Other Ambulatory Visit: Payer: Self-pay

## 2024-09-28 DIAGNOSIS — I4819 Other persistent atrial fibrillation: Secondary | ICD-10-CM

## 2024-09-28 DIAGNOSIS — M950 Acquired deformity of nose: Secondary | ICD-10-CM | POA: Insufficient documentation

## 2024-09-28 DIAGNOSIS — Z87891 Personal history of nicotine dependence: Secondary | ICD-10-CM | POA: Insufficient documentation

## 2024-09-28 DIAGNOSIS — E78019 Familial hypercholesterolemia, unspecified: Secondary | ICD-10-CM | POA: Diagnosis not present

## 2024-09-28 DIAGNOSIS — Z1152 Encounter for screening for COVID-19: Secondary | ICD-10-CM | POA: Diagnosis not present

## 2024-09-28 DIAGNOSIS — R531 Weakness: Secondary | ICD-10-CM | POA: Diagnosis not present

## 2024-09-28 DIAGNOSIS — E785 Hyperlipidemia, unspecified: Secondary | ICD-10-CM | POA: Insufficient documentation

## 2024-09-28 DIAGNOSIS — Z85828 Personal history of other malignant neoplasm of skin: Secondary | ICD-10-CM | POA: Diagnosis not present

## 2024-09-28 DIAGNOSIS — D696 Thrombocytopenia, unspecified: Secondary | ICD-10-CM | POA: Insufficient documentation

## 2024-09-28 DIAGNOSIS — K219 Gastro-esophageal reflux disease without esophagitis: Secondary | ICD-10-CM | POA: Insufficient documentation

## 2024-09-28 DIAGNOSIS — Z9889 Other specified postprocedural states: Secondary | ICD-10-CM | POA: Diagnosis not present

## 2024-09-28 DIAGNOSIS — Z743 Need for continuous supervision: Secondary | ICD-10-CM | POA: Diagnosis not present

## 2024-09-28 DIAGNOSIS — Z79899 Other long term (current) drug therapy: Secondary | ICD-10-CM | POA: Diagnosis not present

## 2024-09-28 DIAGNOSIS — J101 Influenza due to other identified influenza virus with other respiratory manifestations: Secondary | ICD-10-CM | POA: Diagnosis present

## 2024-09-28 DIAGNOSIS — I480 Paroxysmal atrial fibrillation: Secondary | ICD-10-CM | POA: Insufficient documentation

## 2024-09-28 DIAGNOSIS — Z7901 Long term (current) use of anticoagulants: Secondary | ICD-10-CM | POA: Insufficient documentation

## 2024-09-28 DIAGNOSIS — I10 Essential (primary) hypertension: Secondary | ICD-10-CM | POA: Diagnosis not present

## 2024-09-28 DIAGNOSIS — Z4802 Encounter for removal of sutures: Secondary | ICD-10-CM | POA: Diagnosis present

## 2024-09-28 DIAGNOSIS — D72819 Decreased white blood cell count, unspecified: Secondary | ICD-10-CM | POA: Diagnosis not present

## 2024-09-28 DIAGNOSIS — J441 Chronic obstructive pulmonary disease with (acute) exacerbation: Secondary | ICD-10-CM

## 2024-09-28 DIAGNOSIS — R509 Fever, unspecified: Secondary | ICD-10-CM | POA: Diagnosis present

## 2024-09-28 LAB — URINALYSIS, W/ REFLEX TO CULTURE (INFECTION SUSPECTED)
Bacteria, UA: NONE SEEN
Bilirubin Urine: NEGATIVE
Glucose, UA: NEGATIVE mg/dL
Ketones, ur: 15 mg/dL — AB
Leukocytes,Ua: NEGATIVE
Nitrite: NEGATIVE
Specific Gravity, Urine: 1.02 (ref 1.005–1.030)
pH: 5.5 (ref 5.0–8.0)

## 2024-09-28 LAB — CBC WITH DIFFERENTIAL/PLATELET
Abs Immature Granulocytes: 0 K/uL (ref 0.00–0.07)
Basophils Absolute: 0 K/uL (ref 0.0–0.1)
Basophils Relative: 1 %
Eosinophils Absolute: 0 K/uL (ref 0.0–0.5)
Eosinophils Relative: 1 %
HCT: 41.1 % (ref 39.0–52.0)
Hemoglobin: 13 g/dL (ref 13.0–17.0)
Immature Granulocytes: 0 %
Lymphocytes Relative: 11 %
Lymphs Abs: 0.3 K/uL — ABNORMAL LOW (ref 0.7–4.0)
MCH: 27.2 pg (ref 26.0–34.0)
MCHC: 31.6 g/dL (ref 30.0–36.0)
MCV: 86 fL (ref 80.0–100.0)
Monocytes Absolute: 0.3 K/uL (ref 0.1–1.0)
Monocytes Relative: 12 %
Neutro Abs: 1.8 K/uL (ref 1.7–7.7)
Neutrophils Relative %: 75 %
Platelets: 95 K/uL — ABNORMAL LOW (ref 150–400)
RBC: 4.78 MIL/uL (ref 4.22–5.81)
RDW: 16.8 % — ABNORMAL HIGH (ref 11.5–15.5)
WBC: 2.4 K/uL — ABNORMAL LOW (ref 4.0–10.5)
nRBC: 0 % (ref 0.0–0.2)

## 2024-09-28 LAB — COMPREHENSIVE METABOLIC PANEL WITH GFR
ALT: 16 U/L (ref 0–44)
AST: 28 U/L (ref 15–41)
Albumin: 4.1 g/dL (ref 3.5–5.0)
Alkaline Phosphatase: 76 U/L (ref 38–126)
Anion gap: 12 (ref 5–15)
BUN: 11 mg/dL (ref 8–23)
CO2: 25 mmol/L (ref 22–32)
Calcium: 9.4 mg/dL (ref 8.9–10.3)
Chloride: 100 mmol/L (ref 98–111)
Creatinine, Ser: 0.91 mg/dL (ref 0.61–1.24)
GFR, Estimated: >60 mL/min (ref >60)
Glucose, Bld: 111 mg/dL — ABNORMAL HIGH (ref 70–99)
Potassium: 4.6 mmol/L (ref 3.5–5.1)
Sodium: 137 mmol/L (ref 135–145)
Total Bilirubin: 0.9 mg/dL (ref 0.0–1.2)
Total Protein: 6.8 g/dL (ref 6.5–8.1)

## 2024-09-28 LAB — LACTIC ACID, PLASMA
Lactic Acid, Venous: 1.5 mmol/L (ref 0.5–1.9)
Lactic Acid, Venous: 2.2 mmol/L (ref 0.5–1.9)

## 2024-09-28 LAB — RESP PANEL BY RT-PCR (RSV, FLU A&B, COVID)  RVPGX2
Influenza A by PCR: POSITIVE — AB
Influenza B by PCR: NEGATIVE
Resp Syncytial Virus by PCR: NEGATIVE
SARS Coronavirus 2 by RT PCR: NEGATIVE

## 2024-09-28 MED ORDER — ACETAMINOPHEN 650 MG RE SUPP: 650.0000 mg | Freq: Four times a day (QID) | RECTAL | Status: DC | PRN

## 2024-09-28 MED ORDER — IRBESARTAN 150 MG PO TABS
150.0000 mg | ORAL_TABLET | Freq: Every day | ORAL | Status: DC
  Administered 2024-09-29: 150 mg via ORAL
  Filled 2024-09-28: qty 1

## 2024-09-28 MED ORDER — HYDRALAZINE HCL 20 MG/ML IJ SOLN: 10.0000 mg | Freq: Three times a day (TID) | INTRAMUSCULAR | Status: DC | PRN

## 2024-09-28 MED ORDER — ONDANSETRON HCL 4 MG/2ML IJ SOLN: 4.0000 mg | Freq: Four times a day (QID) | INTRAMUSCULAR | Status: DC | PRN

## 2024-09-28 MED ORDER — SODIUM CHLORIDE 0.9 % IV SOLN: INTRAVENOUS | Status: AC

## 2024-09-28 MED ORDER — ACETAMINOPHEN 325 MG PO TABS
650.0000 mg | ORAL_TABLET | Freq: Four times a day (QID) | ORAL | Status: DC | PRN
  Administered 2024-09-29: 650 mg via ORAL
  Filled 2024-09-28: qty 2

## 2024-09-28 MED ORDER — IPRATROPIUM-ALBUTEROL 0.5-2.5 (3) MG/3ML IN SOLN
3.0000 mL | Freq: Once | RESPIRATORY_TRACT | Status: AC
  Administered 2024-09-28: 13:00:00 3 mL via RESPIRATORY_TRACT
  Filled 2024-09-28: qty 3

## 2024-09-28 MED ORDER — OSELTAMIVIR PHOSPHATE 75 MG PO CAPS
75.0000 mg | ORAL_CAPSULE | Freq: Once | ORAL | Status: AC
  Administered 2024-09-28: 14:00:00 75 mg via ORAL
  Filled 2024-09-28: qty 1

## 2024-09-28 MED ORDER — PRAVASTATIN SODIUM 40 MG PO TABS
40.0000 mg | ORAL_TABLET | Freq: Every day | ORAL | Status: DC
  Administered 2024-09-28: 23:00:00 40 mg via ORAL
  Filled 2024-09-28: qty 1

## 2024-09-28 MED ORDER — FLUTICASONE FUROATE-VILANTEROL 200-25 MCG/ACT IN AEPB
1.0000 | INHALATION_SPRAY | Freq: Every day | RESPIRATORY_TRACT | Status: DC
  Administered 2024-09-29: 1 via RESPIRATORY_TRACT
  Filled 2024-09-28: qty 28

## 2024-09-28 MED ORDER — AMLODIPINE BESYLATE 5 MG PO TABS
5.0000 mg | ORAL_TABLET | Freq: Every day | ORAL | Status: DC
  Administered 2024-09-29: 5 mg via ORAL
  Filled 2024-09-28: qty 1

## 2024-09-28 MED ORDER — METHYLPREDNISOLONE SODIUM SUCC 125 MG IJ SOLR
125.0000 mg | Freq: Once | INTRAMUSCULAR | Status: AC
  Administered 2024-09-28: 23:00:00 125 mg via INTRAVENOUS
  Filled 2024-09-28: qty 2

## 2024-09-28 MED ORDER — PANTOPRAZOLE SODIUM 40 MG PO TBEC
40.0000 mg | DELAYED_RELEASE_TABLET | Freq: Every day | ORAL | Status: DC
  Administered 2024-09-29: 40 mg via ORAL
  Filled 2024-09-28: qty 1

## 2024-09-28 MED ORDER — SODIUM CHLORIDE 0.9 % IV BOLUS
1000.0000 mL | Freq: Once | INTRAVENOUS | Status: AC
  Administered 2024-09-28: 21:00:00 1000 mL via INTRAVENOUS

## 2024-09-28 MED ORDER — PREDNISONE 20 MG PO TABS
40.0000 mg | ORAL_TABLET | Freq: Every day | ORAL | Status: DC
  Administered 2024-09-29: 40 mg via ORAL
  Filled 2024-09-28: qty 2

## 2024-09-28 MED ORDER — GUAIFENESIN 100 MG/5ML PO LIQD
5.0000 mL | ORAL | Status: DC | PRN
  Administered 2024-09-29: 5 mL via ORAL
  Filled 2024-09-28: qty 10

## 2024-09-28 MED ORDER — ACETAMINOPHEN 500 MG PO TABS
1000.0000 mg | ORAL_TABLET | Freq: Once | ORAL | Status: AC
  Administered 2024-09-28: 14:00:00 1000 mg via ORAL
  Filled 2024-09-28: qty 2

## 2024-09-28 MED ORDER — ALBUTEROL SULFATE (2.5 MG/3ML) 0.083% IN NEBU: 2.5000 mg | INHALATION_SOLUTION | RESPIRATORY_TRACT | Status: DC | PRN

## 2024-09-28 MED ORDER — SENNOSIDES-DOCUSATE SODIUM 8.6-50 MG PO TABS: 1.0000 | ORAL_TABLET | Freq: Every evening | ORAL | Status: DC | PRN

## 2024-09-28 MED ORDER — RIVAROXABAN 20 MG PO TABS
20.0000 mg | ORAL_TABLET | Freq: Every day | ORAL | Status: DC
  Administered 2024-09-29: 20 mg via ORAL
  Filled 2024-09-28: qty 1

## 2024-09-28 MED ORDER — ONDANSETRON HCL 4 MG PO TABS: 4.0000 mg | ORAL_TABLET | Freq: Four times a day (QID) | ORAL | Status: DC | PRN

## 2024-09-28 MED ORDER — OSELTAMIVIR PHOSPHATE 75 MG PO CAPS
75.0000 mg | ORAL_CAPSULE | Freq: Two times a day (BID) | ORAL | Status: DC
  Administered 2024-09-28 – 2024-09-29 (×2): 75 mg via ORAL
  Filled 2024-09-28 (×2): qty 1

## 2024-09-28 MED ORDER — BISACODYL 5 MG PO TBEC: 5.0000 mg | DELAYED_RELEASE_TABLET | Freq: Every day | ORAL | Status: DC | PRN

## 2024-09-28 NOTE — H&P (Signed)
 History and Physical  Guy Moore FMW:[id number] DOB: 12-31-1933 DOA: 09/28/2024  PCP: Johnny Garnette LABOR, MD   Chief Complaint: Fever, generalized weakness  HPI: Guy Moore is a 89 y.o. male with medical history significant for HTN, HLD, thrombocytopenia, leukopenia, OSA, A-fib, basal cell carcinoma, early right lower dysplastic syndrome, and vitamin B12 deficiency who recently underwent reconstructive nasal surgery(Mohs repair) for basal cell carcinoma of the skin/deformity of the nose by ENT about 6 days ago presented to [facility] with fever and generalized weakness. Patient reports he went to a birthday party 2 days ago and started feeling generalized weakness yesterday. This morning he had a fever of 102 so he called his ENT doctor and was advised to present to the ED. He endorsed mild cough but denies any shortness of breath, chest pain, nausea, vomiting, abdominal pain, diarrhea or dysuria.  [facility] Course: Initial vitals show patient afebrile, RR 14-25, HR 60-90, SBP 140-170, SpO2 85% on room air. Initial labs significant for WBC 2.4, Hgb 13.0, platelet 95, normal renal function and LFTs, lactic acid 1.5-2.2, positive influenza A by PCR, UA with moderate hemoglobinuria and mild ketonuria but no signs of infection. CXR shows no active disease. Pt received Tylenol , DuoNeb and Tamiflu . ENT was consulted for evaluation. 17 sutures from the left nose and 2 sutures from the left ear were removed by EDP.  Patient was admitted to [facility] service and transferred to [place].  Review of Systems: Please see HPI for pertinent positives and negatives. A complete 10 system review of systems are otherwise negative.  Past Medical History:  Diagnosis Date   Allergic rhinitis    Arthritis    Asthma    Atrial fibrillation [facility])    sees Dr. Belvie Heckler at [facility]    Atrial flutter (HCC)    BCC (basal cell carcinoma of skin)    Nose   Chronic kidney disease     Complication of anesthesia    no problems per patient   COPD with emphysema (HCC)    sees Dr. Carolynne Allan    Diverticulosis    Dysrhythmia    afib, flutter and tachycardia   Erosive esophagitis    Esophageal stricture    Gastric ulcer    Hearing loss    uses amplification   Hemorrhoids    Hiatal hernia    History of kidney stones    HTN (hypertension)    Hyperlipidemia    Knee problem    2% permanent partial impairment Right   Macular degeneration    Perineal abscess    Pneumonia    Pre-diabetes    Presence of permanent cardiac pacemaker    Sleep apnea    cpap   Past Surgical History:  Procedure Laterality Date   CARDIAC ELECTROPHYSIOLOGY STUDY AND ABLATION     x 4 (sees Dr. Belvie Linger at [facility])   CO2 LASER APPLICATION N/A 03/26/2023   Procedure: CO2 LASER ABLATION OF PENIS;  Surgeon: Selma Donnice SAUNDERS, MD;  Location: [facility];  Service: Urology;  Laterality: N/A;   CYST EXCISION PERINEAL     CYSTOSCOPY WITH URETEROSCOPY AND STENT PLACEMENT Right 10/29/2022   Procedure: CYSTOSCOPY WITH RETROGRADE WITH URETHERAL DILITATIONRIGHT DIAGNOSTIC  URETEROSCOPY AND STENT PLACEMENT;  Surgeon: Selma Donnice SAUNDERS, MD;  Location: [facility];  Service: Urology;  Laterality: Right;   CYSTOSCOPY/URETEROSCOPY/HOLMIUM LASER/STENT PLACEMENT Right 11/27/2022   Procedure: CYSTOSCOPY/ RIGHT RETROGRADE PYELOGRAM/RIGHT HOLMIUM LASER/RIGHT STENT PLACEMENT/BASKET;  Surgeon: Selma Donnice SAUNDERS, MD;  Location:  [facility];  Service: Urology;  Laterality: Right;  60 MINUTES NEEDED FOR CASE   INSERT / REPLACE / REMOVE PACEMAKER     NOSE SURGERY     septoplasty   PENILE BIOPSY N/A 11/27/2022   Procedure: PENILE BIOPSY;  Surgeon: Selma Donnice SAUNDERS, MD;  Location: [facility];  Service: Urology;  Laterality: N/A;   TRANSURETHRAL RESECTION OF PROSTATE N/A 03/26/2023   Procedure: BIPOLAR TRANSURETHRAL RESECTION OF THE PROSTATE (TURP);  Surgeon: Selma Donnice SAUNDERS, MD;  Location: [facility];  Service: Urology;  Laterality: N/A;  90  MINUTES NEEDED FOR CASE   UVULOPALATOPHARYNGOPLASTY  06/13/1979   VASECTOMY  06/12/1969   w/complications   Social History:  reports that he quit smoking about 46 years ago. His smoking use included cigarettes. He started smoking about 75 years ago. He has a 28 pack-year smoking history. He has never used smokeless tobacco. He reports that he does not currently use alcohol. He reports that he does not use drugs.  Allergies[1]  Family History  Problem Relation Age of Onset   Stroke Mother    Breast cancer Mother    Heart attack Mother    Other Mother        brain tunor   Heart disease Father    Hypertension Father    Bladder Cancer Father    Parkinson's disease Father    Stroke Father    Breast cancer Brother    Atrial fibrillation Brother    Tremor Brother    Stroke Brother    Hypertension Brother    Breast cancer Maternal Grandmother    Heart disease Maternal Grandmother    Heart disease Maternal Grandfather    Arthritis Daughter    Heart disease Daughter    Colitis Daughter        Lymphocytic   Colon cancer Neg Hx      Prior to Admission medications  Medication Sig Start Date End Date Taking? Authorizing Provider  albuterol  (PROVENTIL ) (2.5 MG/3ML) 0.083% nebulizer solution Take 3 mLs (2.5 mg total) by nebulization every 4 (four) hours as needed for wheezing or shortness of breath. 11/11/23   Johnny Garnette LABOR, MD  albuterol  (VENTOLIN  HFA) 108 [phone] Base) MCG/ACT inhaler Inhale 2 puffs into the lungs every 4 (four) hours as needed for wheezing or shortness of breath. 11/11/23   Johnny Garnette LABOR, MD  amLODipine  (NORVASC ) 5 MG tablet TAKE 1 TABLET DAILY 11/26/23   Johnny Garnette LABOR, MD  azelastine  (ASTELIN ) 0.1 % nasal spray Place 1 spray into both nostrils 2 (two) times daily. Use in each nostril as directed 02/09/24   Hope Almarie ORN, NP  BESIVANCE 0.6 % SUSP Place 1 drop into both eyes See admin instructions. Instill 1 drop into both eyes 3 times daily on the day of monthly eye  injections then 4 times daily the day after 07/24/20   [provider]  Carboxymethylcellul-Glycerin (LUBRICATING EYE DROPS OP) Place 1 drop into both eyes daily as needed (dry eyes).    [provider]  cyanocobalamin  (VITAMIN B12) 1000 MCG/ML injection Inject 1 mL (1,000 mcg total) into the muscle every 30 (thirty) days. 11/30/22   Johnny Garnette LABOR, MD  fluticasone  (FLONASE ) 50 MCG/ACT nasal spray Place 1 spray into both nostrils daily. 02/04/23   Sood, Vineet, MD  fluticasone -salmeterol (ADVAIR) 250-50 MCG/ACT AEPB Inhale 1 puff into the lungs in the morning and at bedtime. 08/23/24   Hope Almarie ORN, NP  hydrocortisone cream 1 % Apply 1 Application topically daily  as needed for itching.    [provider]  MAGNESIUM  GLUCONATE PO Take 1 tablet by mouth daily.    [provider]  Methylcellulose, Laxative, (CITRUCEL PO) Take 1-2 tablets by mouth daily as needed (constipation).    [provider]  Multiple Vitamins-Minerals (PRESERVISION AREDS 2) CAPS Take 1 capsule by mouth 2 (two) times daily.    [provider]  neomycin -bacitracin -polymyxin (NEOSPORIN) OINT Apply 1 Application topically as needed for wound care.    [provider]  omeprazole  (PRILOSEC) 40 MG capsule Take 1 capsule (40 mg total) by mouth daily. 09/21/24   Johnny Garnette LABOR, MD  potassium chloride  SA (KLOR-CON  M) 20 MEQ tablet TAKE 1 TABLET DAILY 09/07/23   Johnny Garnette LABOR, MD  pravastatin  (PRAVACHOL ) 40 MG tablet TAKE 1 TABLET AT BEDTIME 08/23/24   Johnny Garnette LABOR, MD  valsartan  (DIOVAN ) 160 MG tablet TAKE 1 TABLET DAILY 01/25/24   Johnny Garnette LABOR, MD  XARELTO  20 MG TABS tablet TAKE 1 TABLET DAILY 03/08/24   Johnny Garnette LABOR, MD    Physical Exam: BP (!) 142/67 (BP Location: Right Arm)   Pulse 81   Temp 98.9 F (37.2 C)   Resp 20   SpO2 95%  General: Pleasant, weak appearing elderly male laying in bed. No acute distress. HEENT: Healing surgical scars across the left  nose, left nasolabial fold and left ear with dried blood. Patent nasal passway. (See media tab). Decreased hearing from the left ear. CV: Regular rate. Paced rhythm. No murmurs, rubs, or gallops. No LE edema Pulmonary: Lungs CTAB. Normal effort. Mild expiratory wheezes in the upper lung fields. Abdominal: Soft, nontender, nondistended. Normal bowel sounds. Extremities: Palpable radial and DP pulses. Normal ROM. Skin: Warm and dry. No obvious rash or lesions. Neuro: A&Ox3. Moves all extremities. Normal sensation to light touch. No focal deficit. Psych: Normal mood and affect          Labs on Admission:  Basic Metabolic Panel: Recent Labs  Lab 09/28/24 1110  NA 137  K 4.6  CL 100  CO2 25  GLUCOSE 111*  BUN 11  CREATININE 0.91  CALCIUM 9.4   Liver Function Tests: Recent Labs  Lab 09/28/24 1110  AST 28  ALT 16  ALKPHOS 76  BILITOT 0.9  PROT 6.8  ALBUMIN 4.1   No results for input(s): LIPASE, AMYLASE in the last 168 hours. No results for input(s): AMMONIA in the last 168 hours. CBC: Recent Labs  Lab 09/28/24 1110  WBC 2.4*  NEUTROABS 1.8  HGB 13.0  HCT 41.1  MCV 86.0  PLT 95*   Cardiac Enzymes: No results for input(s): CKTOTAL, CKMB, CKMBINDEX, TROPONINI in the last 168 hours. BNP (last 3 results) No results for input(s): BNP in the last 8760 hours.  ProBNP (last 3 results) No results for input(s): PROBNP in the last 8760 hours.  CBG: No results for input(s): GLUCAP in the last 168 hours.  Radiological Exams on Admission: DG Chest 2 View Result Date: 09/28/2024 CLINICAL DATA:  Dyspnea on exertion EXAM: CHEST - 2 VIEW COMPARISON:  February 09, 2024 FINDINGS: The heart size and mediastinal contours are within normal limits. Left-sided pacemaker is unchanged. Minimal bibasilar subsegmental atelectasis is noted with probable small right pleural effusion. The visualized skeletal structures are unremarkable. IMPRESSION: Minimal bibasilar  subsegmental atelectasis with probable small right pleural effusion. Electronically Signed   By: Lynwood Landy Raddle M.D.   On: 09/28/2024 11:32   Assessment/Plan QUEST TAVENNER is a  89 y.o. male with medical history significant for HTN, HLD, thrombocytopenia, leukopenia, COPD/asthma, OSA, A-fib, basal cell carcinoma, early right lower dysplastic syndrome, and vitamin B12 deficiency who recently underwent reconstructive nasal surgery(Mohs repair) for basal cell carcinoma of the skin/deformity of the nose by ENT about 6 days ago presented with fever and generalized weakness and admitted for influenza A infection.  # Influenza A infection - Elderly patient with COPD/asthma presented with fever and generalized weakness after attending birthday party 2 days ago - Patient found to have positive influenza A by PCR on admission - Nontoxic-appearing, stable SpO2 on room air has mild wheezing on lung auscultation - Admitted for observation and symptomatic control - Tamiflu  75 mg twice daily for 5 days - Start IV NS 1 L bolus followed by 100 cc/h for 10 hours - As needed Robitussin and DuoNeb - Droplet and contact precaution  # Asthma/COPD exacerbation - Patient with persistent wheezing and congested cough on exam - Likely secondary to influenza A infection - IV Solu-Medrol  125 mg x 1 followed by prednisone  40 mg daily for 4 days - Continue home bronchodilator - As needed albuterol  neb  # Hx of basal cell carcinoma # Acquired deformity of nose - Patient had MOHs surgery on 12/12 at [facility] - ENT was consulted by EDP and recommended removal of sutures which was done by ED provider prior to admission - Follow-up with ENT in the outpatient  # Persistent A-fib - Status post pacemaker, currently ventricularly paced rhythm - HR stable in the 70s to 80s - Continue Xarelto   # HTN - BP elevated with SBP in the 140s to 170s - Continue amlodipine  and valsartan  (irbesartan  as substitute) - IV hydralazine   as needed for SBP > 180  # Leukopenia # Thrombocytopenia - Chronic and followed by hematology (Dr. Sherrod) in the outpatient - WBC 2.4, platelet 95 on admission - Trend CBC  # HLD - Continue pravastatin   # GERD - Continue Protonix   # Generalized weakness - The setting of influenza infection - PT/OT eval and treat  DVT prophylaxis: Xarelto     Code Status: Full Code  Consults called: ENT  Family Communication: No family at bedside  Severity of Illness: The appropriate patient status for this patient is OBSERVATION. Observation status is judged to be reasonable and necessary in order to provide the required intensity of service to ensure the patient's safety. The patient's presenting symptoms, physical exam findings, and initial radiographic and laboratory data in the context of their medical condition is felt to place them at decreased risk for further clinical deterioration. Furthermore, it is anticipated that the patient will be medically stable for discharge from the hospital within 2 midnights of admission.   Level of care: Telemetry   I personally spent a total of 75 minutes in the care of the patient today including preparing to see the patient, getting/reviewing separately obtained history, performing a medically appropriate exam/evaluation, placing orders, and documenting clinical information in the EHR.   Lou Claretta HERO, MD 09/28/2024, 9:59 PM [facility] Pager: [phone] Isaiah 41:10   If 7PM-7AM, please contact night-coverage [email or web address] Password [email or web address]     [1] No Known Allergies

## 2024-09-28 NOTE — ED Provider Notes (Signed)
  EMERGENCY DEPARTMENT AT [facility] Provider Note   CSN: [id number] Arrival date & time: 09/28/24  1028     Patient presents with: Fever   Guy Moore is a 89 y.o. male.   HPI Patient had a reconstructive nasal surgery for a squamous cell removal 6 days ago.  He had instructions for call or emergency department evaluation for fever.  Patient reports he had been doing well.  He developed a fever to 102 this morning.  He had a little bit of general malaise and not feeling well.  He has not had any vomiting or diarrhea.  Patient reports a little bit of cough has started.  He does not have chest pain or shortness of breath.  The patient was due to get his dressing removed tomorrow.    Prior to Admission medications  Medication Sig Start Date End Date Taking? Authorizing Provider  albuterol  (PROVENTIL ) (2.5 MG/3ML) 0.083% nebulizer solution Take 3 mLs (2.5 mg total) by nebulization every 4 (four) hours as needed for wheezing or shortness of breath. 11/11/23   Johnny Garnette LABOR, MD  albuterol  (VENTOLIN  HFA) 108 (90 Base) MCG/ACT inhaler Inhale 2 puffs into the lungs every 4 (four) hours as needed for wheezing or shortness of breath. 11/11/23   Johnny Garnette LABOR, MD  amLODipine  (NORVASC ) 5 MG tablet TAKE 1 TABLET DAILY 11/26/23   Johnny Garnette LABOR, MD  azelastine  (ASTELIN ) 0.1 % nasal spray Place 1 spray into both nostrils 2 (two) times daily. Use in each nostril as directed 02/09/24   Hope Almarie ORN, NP  BESIVANCE 0.6 % SUSP Place 1 drop into both eyes See admin instructions. Instill 1 drop into both eyes 3 times daily on the day of monthly eye injections then 4 times daily the day after 07/24/20   [provider]  Carboxymethylcellul-Glycerin (LUBRICATING EYE DROPS OP) Place 1 drop into both eyes daily as needed (dry eyes).    [provider]  cyanocobalamin  (VITAMIN B12) 1000 MCG/ML injection Inject 1 mL (1,000 mcg total) into the muscle every 30 (thirty) days.  11/30/22   Johnny Garnette LABOR, MD  fluticasone  (FLONASE ) 50 MCG/ACT nasal spray Place 1 spray into both nostrils daily. 02/04/23   Sood, Vineet, MD  fluticasone -salmeterol (ADVAIR) 250-50 MCG/ACT AEPB Inhale 1 puff into the lungs in the morning and at bedtime. 08/23/24   Hope Almarie ORN, NP  hydrocortisone cream 1 % Apply 1 Application topically daily as needed for itching.    [provider]  MAGNESIUM  GLUCONATE PO Take 1 tablet by mouth daily.    [provider]  Methylcellulose, Laxative, (CITRUCEL PO) Take 1-2 tablets by mouth daily as needed (constipation).    [provider]  Multiple Vitamins-Minerals (PRESERVISION AREDS 2) CAPS Take 1 capsule by mouth 2 (two) times daily.    [provider]  neomycin -bacitracin -polymyxin (NEOSPORIN) OINT Apply 1 Application topically as needed for wound care.    [provider]  omeprazole  (PRILOSEC) 40 MG capsule Take 1 capsule (40 mg total) by mouth daily. 09/21/24   Johnny Garnette LABOR, MD  potassium chloride  SA (KLOR-CON  M) 20 MEQ tablet TAKE 1 TABLET DAILY 09/07/23   Johnny Garnette LABOR, MD  pravastatin  (PRAVACHOL ) 40 MG tablet TAKE 1 TABLET AT BEDTIME 08/23/24   Johnny Garnette LABOR, MD  valsartan  (DIOVAN ) 160 MG tablet TAKE 1 TABLET DAILY 01/25/24   Johnny Garnette LABOR, MD  XARELTO  20 MG TABS tablet TAKE 1 TABLET DAILY 03/08/24   Johnny Garnette LABOR,  MD    Allergies: Patient has no known allergies.    Review of Systems  Updated Vital Signs BP (!) 144/74   Pulse 95   Temp 98 F (36.7 C) (Oral)   Resp (!) 23   SpO2 93%   Physical Exam Constitutional:      Comments: Alert and nontoxic.  Mental status clear.  No respiratory distress at rest.  Intermittent cough paroxysm.  HENT:     Head:     Comments: Patient has a dressing and splint over the nose.  No active drainage discharge or redness around it.  The right nare is clear and patent.  Patient also has a sponge dressing in the left ear.  The surgical site appears clean and  dry. Cardiovascular:     Rate and Rhythm: Tachycardia present. Rhythm irregular.  Pulmonary:     Comments: Patient does not have respiratory distress at rest.  With deep inspiration however he does have cough paroxysmal.  Scattered expiratory wheeze. Abdominal:     General: There is no distension.     Palpations: Abdomen is soft.     Tenderness: There is no abdominal tenderness. There is no guarding.  Musculoskeletal:        General: No swelling or tenderness. Normal range of motion.     Right lower leg: No edema.     Left lower leg: No edema.  Skin:    General: Skin is warm and dry.  Neurological:     General: No focal deficit present.     Mental Status: He is oriented to person, place, and time.     Coordination: Coordination normal.  Psychiatric:        Mood and Affect: Mood normal.     (all labs ordered are listed, but only abnormal results are displayed) Labs Reviewed  RESP PANEL BY RT-PCR (RSV, FLU A&B, COVID)  RVPGX2 - Abnormal; Notable for the following components:      Result Value   Influenza A by PCR POSITIVE (*)    All other components within normal limits  LACTIC ACID, PLASMA - Abnormal; Notable for the following components:   Lactic Acid, Venous 2.2 (*)    All other components within normal limits  COMPREHENSIVE METABOLIC PANEL WITH GFR - Abnormal; Notable for the following components:   Glucose, Bld 111 (*)    All other components within normal limits  CBC WITH DIFFERENTIAL/PLATELET - Abnormal; Notable for the following components:   WBC 2.4 (*)    RDW 16.8 (*)    Platelets 95 (*)    Lymphs Abs 0.3 (*)    All other components within normal limits  URINALYSIS, W/ REFLEX TO CULTURE (INFECTION SUSPECTED) - Abnormal; Notable for the following components:   Hgb urine dipstick MODERATE (*)    Ketones, ur 15 (*)    Protein, ur TRACE (*)    All other components within normal limits  LACTIC ACID, PLASMA    EKG: None  Radiology: DG Chest 2 View Result Date:  09/28/2024 CLINICAL DATA:  Dyspnea on exertion EXAM: CHEST - 2 VIEW COMPARISON:  February 09, 2024 FINDINGS: The heart size and mediastinal contours are within normal limits. Left-sided pacemaker is unchanged. Minimal bibasilar subsegmental atelectasis is noted with probable small right pleural effusion. The visualized skeletal structures are unremarkable. IMPRESSION: Minimal bibasilar subsegmental atelectasis with probable small right pleural effusion. Electronically Signed   By: Lynwood Landy Raddle M.D.   On: 09/28/2024 11:32     Procedures  Medications Ordered in the ED  ipratropium-albuterol  (DUONEB) 0.5-2.5 (3) MG/3ML nebulizer solution 3 mL (3 mLs Nebulization Given 09/28/24 1232)  acetaminophen  (TYLENOL ) tablet 1,000 mg (1,000 mg Oral Given 09/28/24 1422)  oseltamivir  (TAMIFLU ) capsule 75 mg (75 mg Oral Given 09/28/24 1422)                                    Medical Decision Making Amount and/or Complexity of Data Reviewed Labs: ordered. Radiology: ordered.  Risk OTC drugs. Prescription drug management.   Patient presents as outlined.  He had recent reconstructive nasal surgery for a squamous cell excision.  Exam does not appear to show any acute problems with infection or immediate appearance of complication.  Patient developed fever to 102.  Potential other etiologies infectious in nature.  Patient does have a cough.  Will proceed with diagnostic evaluation.  Patient is nontoxic and not appearing septic at this time.  Patient tests influenza A positive. Chest x-ray by radiology shows some atelectasis no consolidation. White count 2.2 H&H 13 and 41. GFR greater than 60.  Patient was given nebulizer treatment for wheezing.  He is not having respiratory distress.  Tolerated this well and felt like breathing was improved.  Oxygen saturations low to mid 90s on room air.  Tamiflu  given for influenza.  At this time patient would like to consider home treatment with Tamiflu  and  albuterol  therapies at home.  Currently he is maintaining oxygen saturations in the low to mid 90s.  Patient is not toxic in appearance and has clear mental status.  Blood pressures are stable.  Patient is calling his ENT to determine what to do about the dressing that was supposed to be removed tomorrow as he now is influenza positive.  Patient signed out to Dr. Pamella at shift change.  If patient remains stable with respiratory status and mental status, okay to consider home treatment for influenza.  Given patient's age and some fragility does have risk of deterioration but currently does not have respiratory distress and is tolerating oral intake with a preference for trying home care.      Final diagnoses:  Influenza A    ED Discharge Orders     None          Armenta Canning, MD 09/28/24 1531

## 2024-09-28 NOTE — ED Notes (Addendum)
 Pt had nasal reconstructive surgery on Friday, bandages and grafts to be checked tomorrow.  Pt has been taking tylenol  at home for pain and awoke this morning with a fever of 102F (last prior dose of tylenol  1230a).  Pt took tylenol  upon fever discovery and  fever reduced to 66F  but pt advised by surgery office to report to ED for evaluation.

## 2024-09-28 NOTE — Plan of Care (Signed)
 Patient is a 89 year old male with history of hypertension, hyperlipidemia, OSA, A-fib, basal cell carcinoma, early right lower dysplastic syndrome, vitamin B12 deficiency who recently underwent reconstructive nasal surgery(Mohs repair) for basal cell carcinoma of the skin/deformity of the nose by ENT about 6 days ago presented with fever, weakness, malaise. Influenza came out to be positive.  Chest x-ray did not show any signs of pneumonia.  Patient continued to be weak and he was saturating in low to mid 90s on room air and requested for observation

## 2024-09-28 NOTE — ED Notes (Addendum)
 Pt placed on 2 L of oxygen due to decreased SpO2 reading 90-92% on Room Air. Pt denies SOB. NAD.

## 2024-09-28 NOTE — ED Triage Notes (Incomplete)
 Pt caox4 reporting he had surgery on ear and nose for basal cell carcinoma last Friday and started feeling bad over the past 2 days with low grade temp yest and 102 fever this morning. Last took Tylenol  2 hrs ago. Pt c/o SOB, cough, and congestion.

## 2024-09-28 NOTE — ED Notes (Signed)
 Called Lauren at [facility] for transport 16:53-TC

## 2024-09-28 NOTE — ED Provider Notes (Addendum)
°  Physical Exam  BP (!) 145/73   Pulse 68   Temp 98.5 F (36.9 C) (Oral)   Resp (!) 21   SpO2 96%   Physical Exam Vitals and nursing note reviewed.  HENT:     Head: Normocephalic and atraumatic.     Ears:     Comments: See image of left ear below    Nose:     Comments: See image below Eyes:     Pupils: Pupils are equal, round, and reactive to light.  Cardiovascular:     Rate and Rhythm: Normal rate and regular rhythm.  Pulmonary:     Effort: Pulmonary effort is normal.     Breath sounds: Normal breath sounds.  Abdominal:     Palpations: Abdomen is soft.     Tenderness: There is no abdominal tenderness.  Skin:    General: Skin is warm and dry.  Neurological:     Mental Status: He is alert.  Psychiatric:        Mood and Affect: Mood normal.          Procedures  Suture Removal  Date/Time: 09/28/2024 4:16 PM  Performed by: Pamella Ozell LABOR, DO Authorized by: Pamella Ozell LABOR, DO   Consent:    Consent obtained:  Verbal   Consent given by:  Patient   Risks discussed:  Bleeding, pain and wound separation   Alternatives discussed:  No treatment Universal protocol:    Immediately prior to procedure, a time out was called: yes     Patient identity confirmed:  Verbally with patient Procedure details:    Wound appearance:  No signs of infection and clean   Sutures removed: 17 sutures removed from nose.  2 sutures removed from left ear. Post-procedure details:    Post-removal:  No dressing applied and antibiotic ointment applied   Procedure completion:  Tolerated Comments:     Removed 17 sutures from left nose as well as nasal packing Removed 2 sutures from left ear as well as here bolster   ED Course / MDM   Clinical Course as of 09/28/24 1700  Thu Sep 28, 2024  1531 Discussed with ENT office [place] who recommends removal of sutures and packing of left ear and nose.  Dr. Luciano will call me directly when he is out of the OR [MP]  1612 Discussed with Dr.  Luciano who recommends bacitracin  over wounds and will follow-up with patient in the office [MP]  1630 Discussed admitting hospitalist accepts patient for admission [MP]    Clinical Course User Index [MP] Pamella Ozell LABOR, DO   Medical Decision Making I, Ozell Pamella DO, have assumed care of this patient from the previous provider pending discussion with patient's ENT following recent Mohs procedure and disposition  Amount and/or Complexity of Data Reviewed Labs: ordered. Radiology: ordered.  Risk OTC drugs. Prescription drug management. Decision regarding hospitalization.          Pamella Ozell LABOR, DO 09/28/24 1622    Pamella Ozell LABOR, DO 09/28/24 1700

## 2024-09-28 NOTE — ED Notes (Addendum)
 Unable to provide a urine sample at this time, will try again at a later time. Urinal at bedside

## 2024-09-29 ENCOUNTER — Other Ambulatory Visit (HOSPITAL_COMMUNITY): Payer: Self-pay

## 2024-09-29 DIAGNOSIS — J441 Chronic obstructive pulmonary disease with (acute) exacerbation: Secondary | ICD-10-CM | POA: Diagnosis not present

## 2024-09-29 DIAGNOSIS — J101 Influenza due to other identified influenza virus with other respiratory manifestations: Secondary | ICD-10-CM | POA: Diagnosis not present

## 2024-09-29 LAB — BASIC METABOLIC PANEL WITH GFR
Anion gap: 14 (ref 5–15)
BUN: 13 mg/dL (ref 8–23)
CO2: 23 mmol/L (ref 22–32)
Calcium: 8.6 mg/dL — ABNORMAL LOW (ref 8.9–10.3)
Chloride: 100 mmol/L (ref 98–111)
Creatinine, Ser: 0.8 mg/dL (ref 0.61–1.24)
GFR, Estimated: >60 mL/min
Glucose, Bld: 173 mg/dL — ABNORMAL HIGH (ref 70–99)
Potassium: 4.4 mmol/L (ref 3.5–5.1)
Sodium: 136 mmol/L (ref 135–145)

## 2024-09-29 LAB — CBC
HCT: 40.1 % (ref 39.0–52.0)
Hemoglobin: 13 g/dL (ref 13.0–17.0)
MCH: 27.1 pg (ref 26.0–34.0)
MCHC: 32.4 g/dL (ref 30.0–36.0)
MCV: 83.7 fL (ref 80.0–100.0)
Platelets: 126 K/uL — ABNORMAL LOW (ref 150–400)
RBC: 4.79 MIL/uL (ref 4.22–5.81)
RDW: 16.7 % — ABNORMAL HIGH (ref 11.5–15.5)
WBC: 2.5 K/uL — ABNORMAL LOW (ref 4.0–10.5)
nRBC: 0 % (ref 0.0–0.2)

## 2024-09-29 LAB — PHOSPHORUS: Phosphorus: 3.4 mg/dL (ref 2.5–4.6)

## 2024-09-29 LAB — MAGNESIUM: Magnesium: 2.2 mg/dL (ref 1.7–2.4)

## 2024-09-29 MED ORDER — PREDNISONE 20 MG PO TABS
40.0000 mg | ORAL_TABLET | Freq: Every day | ORAL | 0 refills | Status: AC
  Filled 2024-09-29: qty 6, 3d supply, fill #0

## 2024-09-29 MED ORDER — OSELTAMIVIR PHOSPHATE 75 MG PO CAPS
75.0000 mg | ORAL_CAPSULE | Freq: Two times a day (BID) | ORAL | 0 refills | Status: AC
  Filled 2024-09-29: qty 8, 4d supply, fill #0

## 2024-09-29 NOTE — Progress Notes (Signed)
Discharge medications delivered to bedside.

## 2024-09-29 NOTE — Discharge Summary (Signed)
 " Physician Discharge Summary   Guy Moore FMW:[id number] DOB: 1933-12-16 DOA: 09/28/2024  PCP: Johnny Garnette LABOR, MD  Admit date: 09/28/2024 Discharge date: 09/29/2024  Admitted From: Home Disposition:  Home Discharging physician: Alm Apo, MD Barriers to discharge: none  Recommendations at discharge: Wean oxygen off Follow-up with ENT as scheduled  Home Health: n/a Equipment/Devices: O2  Discharge Condition: stable CODE STATUS: Full  Diet recommendation:  Diet Orders (From admission, onward)     Start     Ordered   09/29/24 0000  Diet general        09/29/24 0949   09/28/24 1934  Diet regular Room service appropriate? Yes; Fluid consistency: Thin  Diet effective now       Question Answer Comment  Room service appropriate? Yes   Fluid consistency: Thin      09/28/24 1935            Hospital Course: Guy Moore is an 89 y.o. male with medical history significant for HTN, HLD, thrombocytopenia, leukopenia, OSA, A-fib, basal cell carcinoma, vitamin B12 deficiency who recently underwent reconstructive nasal surgery(Mohs repair) for basal cell carcinoma of the skin/deformity of the nose by ENT about 6 days ago and presented to [facility] with fever and generalized weakness.  Patient reports he went to a birthday party 2 days ago and started feeling generalized weakness yesterday. Morning of admission, he had a fever of 102 so he called his ENT doctor and was advised to present to the ED. He endorsed mild cough but denies any shortness of breath, chest pain, nausea, vomiting, abdominal pain, diarrhea or dysuria.   On workup he was found to be afebrile and flu swab testing was positive for influenza A.  He had significant shortness of breath and wheezing on exam.  He was started on Tamiflu , nebulizers, and steroids. After admission, he had rapid improvement and wheezing resolved.  He had no hypoxia at rest but some desaturations with exertion.  He was amenable  with arranging for home oxygen at discharge in efforts to facilitate discharge home. He was continued on course of Tamiflu  and prednisone  at discharge as well.   The patient's acute and chronic medical conditions were treated accordingly. On day of discharge, patient was felt deemed stable for discharge. Patient/family member advised to call PCP or come back to ER if needed.   Principal Diagnosis: Influenza A  Discharge Diagnoses: Active Hospital Problems   Diagnosis Date Noted   Influenza A 09/28/2024    Priority: 1.   Asthma with COPD with exacerbation (HCC) 09/28/2024    Priority: 2.   Generalized weakness 09/28/2024   Status post Mohs surgery for basal cell carcinoma 09/28/2024   History of basal cell carcinoma of skin 06/27/2014    Resolved Hospital Problems  No resolved problems to display.     Discharge Instructions     Diet general   Complete by: As directed    Increase activity slowly   Complete by: As directed       Allergies as of 09/29/2024   No Known Allergies      Medication List     TAKE these medications    albuterol  108 (90 Base) MCG/ACT inhaler Commonly known as: VENTOLIN  HFA Inhale 2 puffs into the lungs every 4 (four) hours as needed for wheezing or shortness of breath.   albuterol  (2.5 MG/3ML) 0.083% nebulizer solution Commonly known as: PROVENTIL  Take 3 mLs (2.5 mg total) by nebulization every 4 (four) hours as  needed for wheezing or shortness of breath.   amLODipine  5 MG tablet Commonly known as: NORVASC  TAKE 1 TABLET DAILY   azelastine  0.1 % nasal spray Commonly known as: ASTELIN  Place 1 spray into both nostrils 2 (two) times daily. Use in each nostril as directed   Besivance 0.6 % Susp Generic drug: Besifloxacin HCl Place 1 drop into both eyes See admin instructions. Instill 1 drop into both eyes 3 times daily on the day of monthly eye injections then 4 times daily the day after   CITRUCEL PO Take 1-2 tablets by mouth daily as  needed (constipation).   cyanocobalamin  1000 MCG/ML injection Commonly known as: VITAMIN B12 Inject 1 mL (1,000 mcg total) into the muscle every 30 (thirty) days.   fluticasone  50 MCG/ACT nasal spray Commonly known as: FLONASE  Place 1 spray into both nostrils daily.   fluticasone -salmeterol 250-50 MCG/ACT Aepb Commonly known as: ADVAIR Inhale 1 puff into the lungs in the morning and at bedtime.   hydrocortisone cream 1 % Apply 1 Application topically daily as needed for itching.   LUBRICATING EYE DROPS OP Place 1 drop into both eyes daily as needed (dry eyes).   MAGNESIUM  GLUCONATE PO Take 1 tablet by mouth daily.   neomycin -bacitracin -polymyxin Oint Commonly known as: NEOSPORIN Apply 1 Application topically as needed for wound care.   omeprazole  40 MG capsule Commonly known as: PRILOSEC Take 1 capsule (40 mg total) by mouth daily.   oseltamivir  75 MG capsule Commonly known as: TAMIFLU  Take 1 capsule (75 mg total) by mouth 2 (two) times daily for 8 doses.   potassium chloride  SA 20 MEQ tablet Commonly known as: KLOR-CON  M TAKE 1 TABLET DAILY   pravastatin  40 MG tablet Commonly known as: PRAVACHOL  TAKE 1 TABLET AT BEDTIME   predniSONE  20 MG tablet Commonly known as: DELTASONE  Take 2 tablets (40 mg total) by mouth daily with breakfast for 3 days. Start taking on: September 30, 2024   PreserVision AREDS 2 Caps Take 1 capsule by mouth 2 (two) times daily.   valsartan  160 MG tablet Commonly known as: DIOVAN  TAKE 1 TABLET DAILY   Xarelto  20 MG Tabs tablet Generic drug: rivaroxaban  TAKE 1 TABLET DAILY               Durable Medical Equipment  (From admission, onward)           Start     Ordered   09/29/24 0950  For home use only DME oxygen  Once       Question Answer Comment  Length of Need 6 Months   Mode or (Route) Nasal cannula   Liters per Minute 2   Frequency Continuous (stationary and portable oxygen unit needed)   Oxygen conserving device  Yes   Oxygen delivery system: Gas   Oxygen delivery system: Portable concentrator (POC)      09/29/24 0949            Contact information for follow-up providers     Johnny Garnette LABOR, MD. Schedule an appointment as soon as possible for a visit .   Specialty: Family Medicine Contact information: [address] [place] [address] [phone]         Luciano Standing, MD. Schedule an appointment as soon as possible for a visit .   Specialty: Otolaryngology Contact information: [address] [place] [address] [phone]              Contact information for after-discharge care  Durable Medical Equipment     [facility] (DME) Follow up.   Service: Durable Medical Equipment Why: Agency will supply home oxygen. They will deliver travel tank to hospital room before discharge. Contact information: [address] [place]  [address] [phone]                    Allergies[1]  Consultations:   Procedures:   Discharge Exam: BP (!) 174/105 (BP Location: Left Arm)   Pulse 87   Temp 97.6 F (36.4 C)   Resp 20   SpO2 98%  Physical Exam Constitutional:      General: He is not in acute distress.    Appearance: Normal appearance.  HENT:     Head: Normocephalic and atraumatic.     Comments: Surgical incision noted along left nose/cheek with good healing, no erythema, minimal edema as expected.  Nose and ear sutures now removed    Mouth/Throat:     Mouth: Mucous membranes are moist.  Eyes:     Extraocular Movements: Extraocular movements intact.  Cardiovascular:     Rate and Rhythm: Normal rate and regular rhythm.  Pulmonary:     Effort: Pulmonary effort is normal. No respiratory distress.     Breath sounds: Normal breath sounds. No wheezing.  Abdominal:     General: Bowel sounds are normal. There is no distension.     Palpations: Abdomen is soft.     Tenderness: There is  no abdominal tenderness.  Musculoskeletal:        General: Normal range of motion.     Cervical back: Normal range of motion and neck supple.  Skin:    General: Skin is warm and dry.  Neurological:     General: No focal deficit present.     Mental Status: He is alert.  Psychiatric:        Mood and Affect: Mood normal.        Behavior: Behavior normal.      The results of significant diagnostics from this hospitalization (including imaging, microbiology, ancillary and laboratory) are listed below for reference.   Microbiology: Recent Results (from the past 240 hours)  Resp panel by RT-PCR (RSV, Flu A&B, Covid) Anterior Nasal Swab     Status: Abnormal   Collection Time: 09/28/24 12:29 PM   Specimen: Anterior Nasal Swab  Result Value Ref Range Status   SARS Coronavirus 2 by RT PCR NEGATIVE NEGATIVE Final    Comment: (NOTE) SARS-CoV-2 target nucleic acids are NOT DETECTED.  The SARS-CoV-2 RNA is generally detectable in upper respiratory specimens during the acute phase of infection. The lowest concentration of SARS-CoV-2 viral copies this assay can detect is 138 copies/mL. A negative result does not preclude SARS-Cov-2 infection and should not be used as the sole basis for treatment or other patient management decisions. A negative result may occur with  improper specimen collection/handling, submission of specimen other than nasopharyngeal swab, presence of viral mutation(s) within the areas targeted by this assay, and inadequate number of viral copies(<138 copies/mL). A negative result must be combined with clinical observations, patient history, and epidemiological information. The expected result is Negative.  Fact Sheet for Patients:  bloggercourse.com  Fact Sheet for Healthcare Providers:  seriousbroker.it  This test is no t yet approved or cleared by the [place]  FDA and  has been authorized for detection and/or  diagnosis of SARS-CoV-2 by FDA under an Emergency Use Authorization (EUA). This EUA will remain  in  effect (meaning this test can be used) for the duration of the COVID-19 declaration under Section 564(b)(1) of the Act, 21 U.S.C.section 360bbb-3(b)(1), unless the authorization is terminated  or revoked sooner.       Influenza A by PCR POSITIVE (A) NEGATIVE Final   Influenza B by PCR NEGATIVE NEGATIVE Final    Comment: (NOTE) The Xpert Xpress SARS-CoV-2/FLU/RSV plus assay is intended as an aid in the diagnosis of influenza from Nasopharyngeal swab specimens and should not be used as a sole basis for treatment. Nasal washings and aspirates are unacceptable for Xpert Xpress SARS-CoV-2/FLU/RSV testing.  Fact Sheet for Patients: bloggercourse.com  Fact Sheet for Healthcare Providers: seriousbroker.it  This test is not yet approved or cleared by the [place]  FDA and has been authorized for detection and/or diagnosis of SARS-CoV-2 by FDA under an Emergency Use Authorization (EUA). This EUA will remain in effect (meaning this test can be used) for the duration of the COVID-19 declaration under Section 564(b)(1) of the Act, 21 U.S.C. section 360bbb-3(b)(1), unless the authorization is terminated or revoked.     Resp Syncytial Virus by PCR NEGATIVE NEGATIVE Final    Comment: (NOTE) Fact Sheet for Patients: bloggercourse.com  Fact Sheet for Healthcare Providers: seriousbroker.it  This test is not yet approved or cleared by the [place]  FDA and has been authorized for detection and/or diagnosis of SARS-CoV-2 by FDA under an Emergency Use Authorization (EUA). This EUA will remain in effect (meaning this test can be used) for the duration of the COVID-19 declaration under Section 564(b)(1) of the Act, 21 U.S.C. section 360bbb-3(b)(1), unless the authorization is terminated  or revoked.  Performed at [facility], [address], [place], [place] [address]      Labs: BNP (last 3 results) No results for input(s): BNP in the last 8760 hours. Basic Metabolic Panel: Recent Labs  Lab 09/28/24 1110 09/29/24 0829  NA 137 136  K 4.6 4.4  CL 100 100  CO2 25 23  GLUCOSE 111* 173*  BUN 11 13  CREATININE 0.91 0.80  CALCIUM 9.4 8.6*  MG  --  2.2  PHOS  --  3.4   Liver Function Tests: Recent Labs  Lab 09/28/24 1110  AST 28  ALT 16  ALKPHOS 76  BILITOT 0.9  PROT 6.8  ALBUMIN 4.1   No results for input(s): LIPASE, AMYLASE in the last 168 hours. No results for input(s): AMMONIA in the last 168 hours. CBC: Recent Labs  Lab 09/28/24 1110 09/29/24 0829  WBC 2.4* 2.5*  NEUTROABS 1.8  --   HGB 13.0 13.0  HCT 41.1 40.1  MCV 86.0 83.7  PLT 95* 126*   Cardiac Enzymes: No results for input(s): CKTOTAL, CKMB, CKMBINDEX, TROPONINI in the last 168 hours. BNP: Invalid input(s): POCBNP CBG: No results for input(s): GLUCAP in the last 168 hours. D-Dimer No results for input(s): DDIMER in the last 72 hours. Hgb A1c No results for input(s): HGBA1C in the last 72 hours. Lipid Profile No results for input(s): CHOL, HDL, LDLCALC, TRIG, CHOLHDL, LDLDIRECT in the last 72 hours. Thyroid  function studies No results for input(s): TSH, T4TOTAL, T3FREE, THYROIDAB in the last 72 hours.  Invalid input(s): FREET3 Anemia work up No results for input(s): VITAMINB12, FOLATE, FERRITIN, TIBC, IRON, RETICCTPCT in the last 72 hours. Urinalysis    Component Value Date/Time   COLORURINE YELLOW 09/28/2024 1311   APPEARANCEUR CLEAR 09/28/2024 1311   LABSPEC 1.020 09/28/2024 1311   PHURINE 5.5 09/28/2024 1311   GLUCOSEU  NEGATIVE 09/28/2024 1311   HGBUR MODERATE (A) 09/28/2024 1311   BILIRUBINUR NEGATIVE 09/28/2024 1311   BILIRUBINUR Negative 10/22/2022 1100   KETONESUR 15 (A) 09/28/2024  1311   PROTEINUR TRACE (A) 09/28/2024 1311   UROBILINOGEN 0.2 10/22/2022 1100   UROBILINOGEN 1.0 12/20/2007 1025   NITRITE NEGATIVE 09/28/2024 1311   LEUKOCYTESUR NEGATIVE 09/28/2024 1311   Sepsis Labs Recent Labs  Lab 09/28/24 1110 09/29/24 0829  WBC 2.4* 2.5*   Microbiology Recent Results (from the past 240 hours)  Resp panel by RT-PCR (RSV, Flu A&B, Covid) Anterior Nasal Swab     Status: Abnormal   Collection Time: 09/28/24 12:29 PM   Specimen: Anterior Nasal Swab  Result Value Ref Range Status   SARS Coronavirus 2 by RT PCR NEGATIVE NEGATIVE Final    Comment: (NOTE) SARS-CoV-2 target nucleic acids are NOT DETECTED.  The SARS-CoV-2 RNA is generally detectable in upper respiratory specimens during the acute phase of infection. The lowest concentration of SARS-CoV-2 viral copies this assay can detect is 138 copies/mL. A negative result does not preclude SARS-Cov-2 infection and should not be used as the sole basis for treatment or other patient management decisions. A negative result may occur with  improper specimen collection/handling, submission of specimen other than nasopharyngeal swab, presence of viral mutation(s) within the areas targeted by this assay, and inadequate number of viral copies(<138 copies/mL). A negative result must be combined with clinical observations, patient history, and epidemiological information. The expected result is Negative.  Fact Sheet for Patients:  bloggercourse.com  Fact Sheet for Healthcare Providers:  seriousbroker.it  This test is no t yet approved or cleared by the [place]  FDA and  has been authorized for detection and/or diagnosis of SARS-CoV-2 by FDA under an Emergency Use Authorization (EUA). This EUA will remain  in effect (meaning this test can be used) for the duration of the COVID-19 declaration under Section 564(b)(1) of the Act, 21 U.S.C.section 360bbb-3(b)(1),  unless the authorization is terminated  or revoked sooner.       Influenza A by PCR POSITIVE (A) NEGATIVE Final   Influenza B by PCR NEGATIVE NEGATIVE Final    Comment: (NOTE) The Xpert Xpress SARS-CoV-2/FLU/RSV plus assay is intended as an aid in the diagnosis of influenza from Nasopharyngeal swab specimens and should not be used as a sole basis for treatment. Nasal washings and aspirates are unacceptable for Xpert Xpress SARS-CoV-2/FLU/RSV testing.  Fact Sheet for Patients: bloggercourse.com  Fact Sheet for Healthcare Providers: seriousbroker.it  This test is not yet approved or cleared by the [place]  FDA and has been authorized for detection and/or diagnosis of SARS-CoV-2 by FDA under an Emergency Use Authorization (EUA). This EUA will remain in effect (meaning this test can be used) for the duration of the COVID-19 declaration under Section 564(b)(1) of the Act, 21 U.S.C. section 360bbb-3(b)(1), unless the authorization is terminated or revoked.     Resp Syncytial Virus by PCR NEGATIVE NEGATIVE Final    Comment: (NOTE) Fact Sheet for Patients: bloggercourse.com  Fact Sheet for Healthcare Providers: seriousbroker.it  This test is not yet approved or cleared by the [place]  FDA and has been authorized for detection and/or diagnosis of SARS-CoV-2 by FDA under an Emergency Use Authorization (EUA). This EUA will remain in effect (meaning this test can be used) for the duration of the COVID-19 declaration under Section 564(b)(1) of the Act, 21 U.S.C. section 360bbb-3(b)(1), unless the authorization is terminated or revoked.  Performed at [facility], [phone]  [address], [place], [place] [address]     Procedures/Studies: DG Chest 2 View Result Date: 09/28/2024 CLINICAL DATA:  Dyspnea on exertion EXAM: CHEST - 2 VIEW COMPARISON:  February 09, 2024  FINDINGS: The heart size and mediastinal contours are within normal limits. Left-sided pacemaker is unchanged. Minimal bibasilar subsegmental atelectasis is noted with probable small right pleural effusion. The visualized skeletal structures are unremarkable. IMPRESSION: Minimal bibasilar subsegmental atelectasis with probable small right pleural effusion. Electronically Signed   By: Lynwood Landy Raddle M.D.   On: 09/28/2024 11:32     Time coordinating discharge: Over 30 minutes    Alm Apo, MD  [facility] 09/29/2024, 12:12 PM    [1] No Known Allergies  "

## 2024-09-29 NOTE — Evaluation (Signed)
 Occupational Therapy Evaluation Patient Details Name: Guy Moore MRN: [id number] DOB: 08-24-1934 Today's Date: 09/29/2024   History of Present Illness   Guy Moore is a 89 y.o. male. underwent reconstructive nasal surgery(Mohs repair) for basal cell carcinoma of the skin/deformity of the nose by ENT about 6 days ago presented to [facility] 12/18 with fever and generalized weakness. Dx with Influenza A. UA with moderate hemoglobinuria and mild ketonuria but no signs of infection. CXR shows no active disease. PMH includes: HTN, HLD, thrombocytopenia, leukopenia, OSA, A-fib, basal cell carcinoma, early right lower dysplastic syndrome     Clinical Impressions Pt is typically independent in ADL and mobility. Has a RW that he uses PRN but does not typically use. Today he is able to demonstrate UB and LB ADL, standing balance for grooming activities, hallway ambulation WITH RW due to increased generalized weakness from baseline. When Pt ambulates their SpO2 drops below 90%. Pt was also symptomatic with shortness of breath with activity. Pt requires seated rest break with pulsed lip breathing and approx 4-5 min to recover shortness of breath. SpO2 recovers quicker, on RA approximately 2 min. Or supplemental O2 of 2L. At this time do not anticipate the need for post-acute OT at this time. Pt requesting a theraband (his at home broke). Pt also educated in energy conservation, and provided handout. Referral to mobility specialist team made.      If plan is discharge home, recommend the following:   A little help with bathing/dressing/bathroom;Assistance with cooking/housework     Functional Status Assessment   Patient has had a recent decline in their functional status and demonstrates the ability to make significant improvements in function in a reasonable and predictable amount of time.     Equipment Recommendations         Recommendations for Other Services          Precautions/Restrictions   Precautions Precautions: Fall Recall of Precautions/Restrictions: Intact Precaution/Restrictions Comments: watch O2 Restrictions Weight Bearing Restrictions Per Provider Order: No     Mobility Bed Mobility               General bed mobility comments: sitting EOB, NT this session    Transfers Overall transfer level: Needs assistance Equipment used: Rolling walker (2 wheels) Transfers: Sit to/from Stand, Bed to chair/wheelchair/BSC Sit to Stand: Contact guard assist, Min assist     Step pivot transfers: Contact guard assist     General transfer comment: vc for safe hand placement, able to power up x3 without physical assist after initially needing min A      Balance Overall balance assessment: Mild deficits observed, not formally tested                                         ADL either performed or assessed with clinical judgement   ADL Overall ADL's : At baseline                                       General ADL Comments: Pt able to demonstrate UB and LB ADL, standing grooming at the sink. Pt educated in energy conservation strategies and educated on ADL that he might need to be more cautious about for tiring him out.     Vision Baseline Vision/History: 1 Wears glasses Ability  to See in Adequate Light: 0 Adequate Patient Visual Report: No change from baseline Vision Assessment?: No apparent visual deficits     Perception         Praxis         Pertinent Vitals/Pain Pain Assessment Pain Assessment: No/denies pain     Extremity/Trunk Assessment Upper Extremity Assessment Upper Extremity Assessment: Generalized weakness   Lower Extremity Assessment Lower Extremity Assessment: Defer to PT evaluation   Cervical / Trunk Assessment Cervical / Trunk Assessment: Kyphotic   Communication Communication Communication: Impaired Factors Affecting Communication: Hearing impaired    Cognition Arousal: Alert Behavior During Therapy: WFL for tasks assessed/performed Cognition: No apparent impairments                               Following commands: Intact       Cueing  General Comments   Cueing Techniques: Verbal cues;Gestural cues  walking saturation note complete   Exercises     Shoulder Instructions      Home Living Family/patient expects to be discharged to:: Private residence Living Arrangements: Spouse/significant other Available Help at Discharge: Family;Available 24 hours/day Type of Home: House Home Access: Stairs to enter [employer] of Steps: 4 Entrance Stairs-Rails: Right Home Layout: Two level;Bed/bath upstairs Alternate Level Stairs-Number of Steps: flight Alternate Level Stairs-Rails: Left Bathroom Shower/Tub: Producer, Television/film/video: Standard     Home Equipment: Agricultural Consultant (2 wheels)          Prior Functioning/Environment Prior Level of Function : Independent/Modified Independent             Mobility Comments: RW PRN ADLs Comments: independent, no shower chair, has been through and enjoys therapy in the past    OT Problem List: Decreased strength;Decreased activity tolerance;Decreased knowledge of use of DME or AE;Cardiopulmonary status limiting activity   OT Treatment/Interventions:        OT Goals(Current goals can be found in the care plan section)   Acute Rehab OT Goals Patient Stated Goal: get back home today OT Goal Formulation: With patient Time For Goal Achievement: 10/06/24 Potential to Achieve Goals: Good ADL Goals Pt Will Perform Grooming: with modified independence;standing Pt Will Perform Upper Body Dressing: with modified independence;sitting Pt Will Perform Lower Body Dressing: with modified independence;sit to/from stand Pt Will Transfer to Toilet: with modified independence;ambulating Pt Will Perform Toileting - Clothing Manipulation and hygiene: with  modified independence;sitting/lateral leans;sit to/from stand Additional ADL Goal #1: Pt will verbalize at least 3 ways to conserve energy during ADL with no cues   OT Frequency:       Co-evaluation              AM-PAC OT 6 Clicks Daily Activity     Outcome Measure Help from another person eating meals?: None Help from another person taking care of personal grooming?: None Help from another person toileting, which includes using toliet, bedpan, or urinal?: None Help from another person bathing (including washing, rinsing, drying)?: A Little Help from another person to put on and taking off regular upper body clothing?: None Help from another person to put on and taking off regular lower body clothing?: A Little 6 Click Score: 22   End of Session Equipment Utilized During Treatment: Gait belt;Rolling walker (2 wheels);Oxygen (2L) Nurse Communication: Mobility status  Activity Tolerance: Patient tolerated treatment well Patient left: in chair;with call bell/phone within reach;with chair alarm set  OT Visit Diagnosis:  Unsteadiness on feet (R26.81);Muscle weakness (generalized) (M62.81)                Time: 9141-9067 OT Time Calculation (min): 34 min Charges:  OT General Charges $OT Visit: 1 Visit OT Evaluation $OT Eval Low Complexity: 1 Low  Leita DEL OTR/L Acute Rehabilitation Services Office: [phone]  Leita PARAS [facility] 09/29/2024, 10:09 AM

## 2024-09-29 NOTE — Progress Notes (Signed)
 PT Cancellation Note  Patient Details Name: MADDEX GARLITZ MRN: [id number] DOB: September 22, 1934   Cancelled Treatment:    Reason Eval/Treat Not Completed: Other (comment). Attempted PT eval at 11:23 but TOC enters room to discuss paperwork. Re-attempted at 11:53 but pt eating lunch. Will continue to check back for PT eval as schedule permits.   Metta Ave PT, DPT 09/29/2024, 11:55 AM

## 2024-09-29 NOTE — Care Management Obs Status (Signed)
 MEDICARE OBSERVATION STATUS NOTIFICATION   Patient Details  Name: Guy Moore MRN: [id number] Date of Birth: [birth year]   Medicare Observation Status Notification Given:  Yes    Sonda Manuella Quill, RN 09/29/2024, 11:58 AM

## 2024-09-29 NOTE — TOC Transition Note (Signed)
 Transition of Care [facility]) - Discharge Note   Patient Details  Name: Guy Moore MRN: [id number] Date of Birth: [birth year]  Transition of Care [facility]) CM/SW Contact:  Sonda Manuella Quill, RN Phone Number: 09/29/2024, 1:39 PM   Clinical Narrative:    Orders received for HHPT; spoke w/ pt in room; pt politely declined services and said he will con't his previous home exercise program; no IP CM needs.   Final next level of care: Home/Self Care Barriers to Discharge: No Barriers Identified   Patient Goals and CMS Choice Patient states their goals for this hospitalization and ongoing recovery are:: home CMS Medicare.gov Compare Post Acute Care list provided to:: Patient        Discharge Placement                       Discharge Plan and Services Additional resources added to the After Visit Summary for     Discharge Planning Services: CM Consult            DME Arranged: Oxygen DME Agency: [employer] Date DME Agency Contacted: 09/29/24 Time DME Agency Contacted: 1153 Representative spoke with at DME Agency: Thomasina Applebaum HH Arranged: NA HH Agency: NA        Social Drivers of Health (SDOH) Interventions SDOH Screenings   Food Insecurity: No Food Insecurity (09/29/2024)  Housing: Low Risk (09/29/2024)  Transportation Needs: No Transportation Needs (09/29/2024)  Utilities: Not At Risk (09/29/2024)  Alcohol Screen: Low Risk (04/04/2024)  Depression (PHQ2-9): Low Risk (01/19/2024)  Financial Resource Strain: Low Risk (01/19/2024)  Physical Activity: Inactive (04/04/2024)  Social Connections: Socially Integrated (09/29/2024)  Stress: No Stress Concern Present (04/04/2024)  Tobacco Use: Medium Risk (09/22/2024)   Received from [facility]  Health Literacy: Adequate Health Literacy (01/19/2024)     Readmission Risk Interventions     No data to display

## 2024-09-29 NOTE — Evaluation (Signed)
 Physical Therapy Evaluation Patient Details Name: Guy Moore MRN: [id number] DOB: 21-Jul-1934 Today's Date: 09/29/2024  History of Present Illness  Guy Moore is a 89 y.o. male. underwent reconstructive nasal surgery(Mohs repair) for basal cell carcinoma of the skin/deformity of the nose by ENT about 6 days ago presented to [facility] 12/18 with fever and generalized weakness. Dx with Influenza A. UA with moderate hemoglobinuria and mild ketonuria but no signs of infection. CXR shows no active disease. PMH includes: HTN, HLD, thrombocytopenia, leukopenia, OSA, A-fib, basal cell carcinoma, early right lower dysplastic syndrome  Clinical Impression  Pt admitted with above diagnosis. PTA, pt reports ind, using RW PRN. On eval, pt able to complete transfers and ambulation with CGA to supv with RW. Step through gait pattern with RW, able to complete direction turns without LOB, minimally conversational and dyspnea noted, on RA with Spo2 90-93%. Pt reports past success with PT. Recommend HHPT at d/c for safe transition to home. Pt currently with functional limitations due to the deficits listed below (see PT Problem List). Pt will benefit from acute skilled PT to increase their independence and safety with mobility to allow discharge.           If plan is discharge home, recommend the following: Assistance with cooking/housework;Assist for transportation;Help with stairs or ramp for entrance   Can travel by private vehicle        Equipment Recommendations None recommended by PT  Recommendations for Other Services       Functional Status Assessment Patient has had a recent decline in their functional status and demonstrates the ability to make significant improvements in function in a reasonable and predictable amount of time.     Precautions / Restrictions Precautions Precautions: Fall Recall of Precautions/Restrictions: Intact Precaution/Restrictions Comments: watch  O2 Restrictions Weight Bearing Restrictions Per Provider Order: No      Mobility  Bed Mobility               General bed mobility comments: up in recliner upon arrival    Transfers Overall transfer level: Needs assistance Equipment used: Rolling walker (2 wheels) Transfers: Sit to/from Stand Sit to Stand: Contact guard assist           General transfer comment: CGA for hand placement to power up, CGA to supv with all reps    Ambulation/Gait Ambulation/Gait assistance: Supervision Gait Distance (Feet): 120 Feet Assistive device: Rolling walker (2 wheels) Gait Pattern/deviations: Step-through pattern, Decreased stride length, Trunk flexed Gait velocity: functional     General Gait Details: step through gait pattern, trunk slightly forward flexed, able to complete direction turns without LOB, minimally conversational and dyspnea noted, on RA with SpO2 90-94%  Stairs            Wheelchair Mobility     Tilt Bed    Modified Rankin (Stroke Patients Only)       Balance Overall balance assessment: Mild deficits observed, not formally tested                                           Pertinent Vitals/Pain Pain Assessment Pain Assessment: No/denies pain    Home Living Family/patient expects to be discharged to:: Private residence Living Arrangements: Spouse/significant other Available Help at Discharge: Family;Available 24 hours/day Type of Home: House Home Access: Stairs to enter Entrance Stairs-Rails: Right Entrance Stairs-Number of Steps: 4 Alternate  Level Stairs-Number of Steps: flight Home Layout: Two level;Bed/bath upstairs Home Equipment: [employer]ant (2 wheels)      Prior Function Prior Level of Function : Independent/Modified Independent             Mobility Comments: RW PRN ADLs Comments: independent, no shower chair, has been through and enjoys therapy in the past     Extremity/Trunk Assessment   Upper  Extremity Assessment Upper Extremity Assessment: Defer to OT evaluation    Lower Extremity Assessment Lower Extremity Assessment: Generalized weakness (AROM WFL, strength grossly 3+/5, symmetrical)    Cervical / Trunk Assessment Cervical / Trunk Assessment: Kyphotic  Communication   Communication Communication: Impaired Factors Affecting Communication: Hearing impaired    Cognition Arousal: Alert Behavior During Therapy: WFL for tasks assessed/performed                             Following commands: Intact       Cueing Cueing Techniques: Verbal cues, Gestural cues     General Comments      Exercises     Assessment/Plan    PT Assessment Patient needs continued PT services  PT Problem List Decreased strength;Decreased activity tolerance;Decreased balance;Cardiopulmonary status limiting activity       PT Treatment Interventions DME instruction;Gait training;Stair training;Functional mobility training;Therapeutic activities;Therapeutic exercise;Balance training;Cognitive remediation;Patient/family education    PT Goals (Current goals can be found in the Care Plan section)  Acute Rehab PT Goals Patient Stated Goal: return home PT Goal Formulation: With patient Time For Goal Achievement: 10/13/24 Potential to Achieve Goals: Good    Frequency Min 3X/week     Co-evaluation               AM-PAC PT 6 Clicks Mobility  Outcome Measure Help needed turning from your back to your side while in a flat bed without using bedrails?: A Little Help needed moving from lying on your back to sitting on the side of a flat bed without using bedrails?: A Little Help needed moving to and from a bed to a chair (including a wheelchair)?: A Little Help needed standing up from a chair using your arms (e.g., wheelchair or bedside chair)?: A Little Help needed to walk in hospital room?: A Little Help needed climbing 3-5 steps with a railing? : A Little 6 Click Score:  18    End of Session Equipment Utilized During Treatment: Gait belt Activity Tolerance: Patient tolerated treatment well Patient left: in chair;with call bell/phone within reach;with chair alarm set (pt received in chair without nurse call cord attached and box blinking red; pt returned to recliner and alerted nurse and nurse secretary that batteries need replaced and nurse cord not present in room) Nurse Communication: Mobility status;Other (comment) (chair alarm batteries and nurse call cord needed) PT Visit Diagnosis: Muscle weakness (generalized) (M62.81);Other abnormalities of gait and mobility (R26.89)    Time: 8765-8743 PT Time Calculation (min) (ACUTE ONLY): 22 min   Charges:   PT Evaluation $PT Eval Low Complexity: 1 Low   PT General Charges $$ ACUTE PT VISIT: 1 Visit         Tori Vedder Brittian PT, DPT 09/29/2024, 1:21 PM

## 2024-09-29 NOTE — Progress Notes (Addendum)
 SATURATION QUALIFICATIONS: (This note is used to comply with regulatory documentation for home oxygen)  Patient Saturations on Room Air at Rest = 98%  Patient Saturations on Room Air while Ambulating = 84%  Patient Saturations on 2 Liters of oxygen while Ambulating = 91%  Please briefly explain why patient needs home oxygen:  When Pt ambulates their SpO2 drops below 90%. Pt was also symptomatic with shortness of breath with activity. Pt requires seated rest break with pulsed lip breathing and approx 4-5 min to recover shortness of breath. SpO2 recovers quicker, on RA approximately 2 min. Or supplemental O2 of 2L.  Leita DEL OTR/L Acute Rehabilitation Services Office: [phone]

## 2024-09-29 NOTE — TOC Initial Note (Signed)
 Transition of Care [facility]) - Initial/Assessment Note    Patient Details  Name: Guy Moore MRN: [id number] Date of Birth: 1934/05/30  Transition of Care [facility]) CM/SW Contact:    Sonda Manuella Quill, RN Phone Number: 09/29/2024, 11:54 AM  Clinical Narrative:                 Orders received for home oxygen; spoke w/ pt in room; pt said he lives at home w/ his wife Guy Moore [phone]); he plans to return at d/c w/ her support; she will provide transportation; pt verified insurance/PCP; he denied SDOH risks; he has walker; pt does not have HH services or home oxygen; pt agreed to receive home oxygen; he does not have an agency preference; referral for home oxygen given to Mitch Caine at [employer]; he said travel tank will be delivered to pt's room before d/c; agency contact info placed follow up provider section of d/c instructions; awaiting PT eval; IP CM is following.  Expected Discharge Plan: Home/Self Care Barriers to Discharge: No Barriers Identified   Patient Goals and CMS Choice Patient states their goals for this hospitalization and ongoing recovery are:: home CMS Medicare.gov Compare Post Acute Care list provided to:: Patient        Expected Discharge Plan and Services   Discharge Planning Services: CM Consult   Living arrangements for the past 2 months: Single Family Home Expected Discharge Date: 09/29/24               DME Arranged: Oxygen DME Agency: [employer] Date DME Agency Contacted: 09/29/24 Time DME Agency Contacted: 1153 Representative spoke with at DME Agency: Thomasina Applebaum            Prior Living Arrangements/Services Living arrangements for the past 2 months: Single Family Home Lives with:: Spouse Patient language and need for interpreter reviewed:: Yes Do you feel safe going back to the place where you live?: Yes      Need for Family Participation in Patient Care: Yes (Comment) Care giver support system in place?: Yes (comment) Current home  services: DME (walker) Criminal Activity/Legal Involvement Pertinent to Current Situation/Hospitalization: No - Comment as needed  Activities of Daily Living      Permission Sought/Granted Permission sought to share information with : Case Manager Permission granted to share information with : Yes, Verbal Permission Granted  Share Information with NAME: Case Manager     Permission granted to share info w Relationship: Maurilio Slain     Emotional Assessment Appearance:: Appears stated age Attitude/Demeanor/Rapport: Gracious Affect (typically observed): Accepting Orientation: : Oriented to Self, Oriented to Place, Oriented to  Time, Oriented to Situation Alcohol / Substance Use: Not Applicable Psych Involvement: No (comment)  Admission diagnosis:  Influenza A [J10.1] Encounter for removal of sutures [Z48.02] Patient Active Problem List   Diagnosis Date Noted   Influenza A 09/28/2024   Asthma with COPD with exacerbation (HCC) 09/28/2024   Generalized weakness 09/28/2024   Status post Mohs surgery for basal cell carcinoma 09/28/2024   Lumbar spinal stenosis 04/04/2024   BPH (benign prostatic hyperplasia) 03/26/2023   Urinary retention 11/04/2022   Ureteral calculus, right 11/04/2022   History of UTI 11/04/2022   Hydronephrosis 11/04/2022   Perineal abscess 08/04/2021   CAP (community acquired pneumonia) 05/25/2021   Dyspnea 05/25/2021   Bicytopenia 08/06/2020   B12 deficiency 05/06/2020   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   Asthma 08/26/2016   Hyperglycemia 05/29/2016   Thoracic scoliosis 12/16/2015   Thrombocytopenia 04/12/2015  History of basal cell carcinoma of skin 06/27/2014   GERD (gastroesophageal reflux disease) 08/31/2011   Anticoagulant long-term use 08/31/2011   OBSTRUCTIVE SLEEP APNEA 07/04/2009   Atrial fibrillation (HCC) 04/19/2009   Allergic rhinitis 04/14/2007   Hyperlipidemia 04/13/2007   Essential hypertension 04/13/2007   PCP:  Johnny Garnette LABOR,  MD Pharmacy:   [facility] [phone] [place], [place] - [address] AT [facility] [address] [facility] [address] [place] [address] Phone: [phone] Fax: [phone]  [facility] - [place], [place] - [address] [place] [address] Phone: [phone] Fax: [phone]     Social Drivers of Health (SDOH) Social History: SDOH Screenings   Food Insecurity: No Food Insecurity (09/29/2024)  Housing: Low Risk (09/29/2024)  Transportation Needs: No Transportation Needs (09/29/2024)  Utilities: Not At Risk (09/29/2024)  Alcohol Screen: Low Risk (04/04/2024)  Depression (PHQ2-9): Low Risk (01/19/2024)  Financial Resource Strain: Low Risk (01/19/2024)  Physical Activity: Inactive (04/04/2024)  Social Connections: Socially Integrated (09/29/2024)  Stress: No Stress Concern Present (04/04/2024)  Tobacco Use: Medium Risk (09/22/2024)   Received from [facility]  Health Literacy: Adequate Health Literacy (01/19/2024)   SDOH Interventions: Food Insecurity Interventions: Intervention Not Indicated, Inpatient TOC Housing Interventions: Intervention Not Indicated, Inpatient TOC Transportation Interventions: Intervention Not Indicated, Inpatient TOC Utilities Interventions: Intervention Not Indicated, Inpatient TOC   Readmission Risk Interventions     No data to display

## 2024-09-29 NOTE — Hospital Course (Signed)
 Guy Moore is an 89 y.o. male with medical history significant for HTN, HLD, thrombocytopenia, leukopenia, OSA, A-fib, basal cell carcinoma, vitamin B12 deficiency who recently underwent reconstructive nasal surgery(Mohs repair) for basal cell carcinoma of the skin/deformity of the nose by ENT about 6 days ago and presented to [facility] with fever and generalized weakness.  Patient reports he went to a birthday party 2 days ago and started feeling generalized weakness yesterday. Morning of admission, he had a fever of 102 so he called his ENT doctor and was advised to present to the ED. He endorsed mild cough but denies any shortness of breath, chest pain, nausea, vomiting, abdominal pain, diarrhea or dysuria.   On workup he was found to be afebrile and flu swab testing was positive for influenza A.  He had significant shortness of breath and wheezing on exam.  He was started on Tamiflu , nebulizers, and steroids. After admission, he had rapid improvement and wheezing resolved.  He had no hypoxia at rest but some desaturations with exertion.  He was amenable with arranging for home oxygen at discharge in efforts to facilitate discharge home. He was continued on course of Tamiflu  and prednisone  at discharge as well.

## 2024-10-02 ENCOUNTER — Telehealth: Payer: Self-pay

## 2024-10-02 NOTE — Transitions of Care (Post Inpatient/ED Visit) (Signed)
 "  10/02/2024  Name: Guy Moore MRN: [id number] DOB: 1934/03/17  Today's TOC FU Call Status: Today's TOC FU Call Status:: Successful TOC FU Call Completed TOC FU Call Complete Date: 10/02/24  Patient's Name and Date of Birth confirmed. Name, DOB  Transition Care Management Follow-up Telephone Call Date of Discharge: 09/29/24 Discharge Facility: [facility]) Type of Discharge: Inpatient Admission Primary Inpatient Discharge Diagnosis:: influenza How have you been since you were released from the hospital?: Better Any questions or concerns?: No  Items Reviewed: Did you receive and understand the discharge instructions provided?: Yes Medications obtained,verified, and reconciled?: Yes (Medications Reviewed) Any new allergies since your discharge?: No Dietary orders reviewed?: Yes  Medications Reviewed Today: Medications Reviewed Today     Reviewed by Emmitt Pan, LPN (Licensed Practical Nurse) on 10/02/24 at 1624  Med List Status: <None>   Medication Order Taking? Sig Documenting Provider Last Dose Status Informant  albuterol  (PROVENTIL ) (2.5 MG/3ML) 0.083% nebulizer solution [id number] Yes Take 3 mLs (2.5 mg total) by nebulization every 4 (four) hours as needed for wheezing or shortness of breath. Johnny Garnette LABOR, MD  Active   albuterol  (VENTOLIN  HFA) 108 [id number] Base) MCG/ACT inhaler [id number] Yes Inhale 2 puffs into the lungs every 4 (four) hours as needed for wheezing or shortness of breath. Johnny Garnette LABOR, MD  Active   amLODipine  (NORVASC ) 5 MG tablet [id number] Yes TAKE 1 TABLET DAILY Johnny Garnette LABOR, MD  Active   azelastine  (ASTELIN ) 0.1 % nasal spray [id number] Yes Place 1 spray into both nostrils 2 (two) times daily. Use in each nostril as directed Hope Almarie ORN, NP  Active   BESIVANCE 0.6 % SUSP [id number] Yes Place 1 drop into both eyes See admin instructions. Instill 1 drop into both eyes 3 times daily on the day of monthly eye injections then 4 times daily the  day after [provider]  Active Self  Carboxymethylcellul-Glycerin (LUBRICATING EYE DROPS OP) [id number] Yes Place 1 drop into both eyes daily as needed (dry eyes). [provider]  Active Self  cyanocobalamin  (VITAMIN B12) 1000 MCG/ML injection [id number] Yes Inject 1 mL (1,000 mcg total) into the muscle every 30 (thirty) days. Johnny Garnette LABOR, MD  Active Self  fluticasone  (FLONASE ) 50 MCG/ACT nasal spray [id number] Yes Place 1 spray into both nostrils daily. Sood, Vineet, MD  Active Self  fluticasone -salmeterol (ADVAIR) 250-50 MCG/ACT AEPB [id number] Yes Inhale 1 puff into the lungs in the morning and at bedtime. Hope Almarie ORN, NP  Active   hydrocortisone cream 1 % [id number] Yes Apply 1 Application topically daily as needed for itching. [provider]  Active Self  MAGNESIUM  GLUCONATE PO [id number] Yes Take 1 tablet by mouth daily. [provider]  Active Self  Methylcellulose, Laxative, (CITRUCEL PO) [id number] Yes Take 1-2 tablets by mouth daily as needed (constipation). [provider]  Active Self  Multiple Vitamins-Minerals (PRESERVISION AREDS 2) CAPS [id number] Yes Take 1 capsule by mouth 2 (two) times daily. [provider]  Active Self  neomycin -bacitracin -polymyxin (NEOSPORIN) OINT [id number] Yes Apply 1 Application topically as needed for wound care. [provider]  Active Self  omeprazole  (PRILOSEC) 40 MG capsule [id number] Yes Take 1 capsule (40 mg total) by mouth daily. Johnny Garnette LABOR, MD  Active   oseltamivir  (TAMIFLU ) 75 MG capsule [id number] Yes Take 1 capsule (75 mg total) by mouth 2 (two) times daily for 8 doses. Patsy Lenis, MD  Active   potassium chloride  SA (KLOR-CON  M) 20 MEQ tablet  [id number] Yes TAKE 1 TABLET DAILY Johnny Garnette LABOR, MD  Active   pravastatin  (PRAVACHOL ) 40 MG tablet [id number] Yes TAKE 1 TABLET AT BEDTIME Johnny Garnette LABOR, MD  Active   predniSONE  (DELTASONE ) 20 MG tablet [id number] Yes Take 2 tablets (40 mg  total) by mouth daily with breakfast for 3 days. Patsy Lenis, MD  Active   valsartan  (DIOVAN ) 160 MG tablet [id number] Yes TAKE 1 TABLET DAILY Johnny Garnette LABOR, MD  Active   XARELTO  20 MG TABS tablet [id number] Yes TAKE 1 TABLET DAILY Johnny Garnette LABOR, MD  Active             Home Care and Equipment/Supplies: Were Home Health Services Ordered?: Yes Name of Home Health Agency:: [facility] Has Agency set up a time to come to your home?: Yes First Home Health Visit Date: 10/01/24 Any new equipment or medical supplies ordered?: Yes Name of Medical supply agency?: [facility] Were you able to get the equipment/medical supplies?: Yes Do you have any questions related to the use of the equipment/supplies?: No  Functional Questionnaire: Do you need assistance with bathing/showering or dressing?: Yes Do you need assistance with meal preparation?: Yes Do you need assistance with getting out of bed/getting out of a chair/moving?: No Do you have difficulty managing or taking your medications?: No  Follow up appointments reviewed: PCP Follow-up appointment confirmed?: Yes Date of PCP follow-up appointment?: 10/10/24 Follow-up Provider: [facility] Follow-up appointment confirmed?: Yes Date of Specialist follow-up appointment?: 10/10/24 Follow-Up Specialty Provider:: surgeon Do you need transportation to your follow-up appointment?: No Do you understand care options if your condition(s) worsen?: Yes-patient verbalized understanding    SIGNATURE Julian Lemmings, LPN [facility] Nurse Health Advisor Direct Dial [phone]  "

## 2024-10-03 ENCOUNTER — Encounter (INDEPENDENT_AMBULATORY_CARE_PROVIDER_SITE_OTHER): Admitting: Ophthalmology

## 2024-10-10 ENCOUNTER — Ambulatory Visit (INDEPENDENT_AMBULATORY_CARE_PROVIDER_SITE_OTHER)

## 2024-10-10 ENCOUNTER — Encounter: Payer: Self-pay | Admitting: Family Medicine

## 2024-10-10 ENCOUNTER — Ambulatory Visit

## 2024-10-10 ENCOUNTER — Ambulatory Visit: Admitting: Family Medicine

## 2024-10-10 VITALS — BP 132/80 | HR 102 | Temp 98.1°F | Wt 231.0 lb

## 2024-10-10 DIAGNOSIS — J101 Influenza due to other identified influenza virus with other respiratory manifestations: Secondary | ICD-10-CM | POA: Diagnosis not present

## 2024-10-10 DIAGNOSIS — J4489 Other specified chronic obstructive pulmonary disease: Secondary | ICD-10-CM | POA: Diagnosis not present

## 2024-10-10 DIAGNOSIS — E538 Deficiency of other specified B group vitamins: Secondary | ICD-10-CM

## 2024-10-10 DIAGNOSIS — R052 Subacute cough: Secondary | ICD-10-CM

## 2024-10-10 DIAGNOSIS — J90 Pleural effusion, not elsewhere classified: Secondary | ICD-10-CM | POA: Diagnosis not present

## 2024-10-10 MED ORDER — POTASSIUM CHLORIDE CRYS ER 20 MEQ PO TBCR: 20.0000 meq | EXTENDED_RELEASE_TABLET | Freq: Every day | ORAL | 3 refills | Status: AC

## 2024-10-10 MED ORDER — CYANOCOBALAMIN 1000 MCG/ML IJ SOLN
1000.0000 ug | Freq: Once | INTRAMUSCULAR | Status: AC
  Administered 2024-10-10: 1000 ug via INTRAMUSCULAR

## 2024-10-10 NOTE — Progress Notes (Signed)
" ° °  Subjective:    Patient ID: Guy Moore, male    DOB: 16-Apr-1934, 89 y.o.   MRN: [id number]  HPI Here to follow up on a hospital stay from 09-28-24 to 09-29-24 for an acute exacerbation of COPD due to a influenza A infection. He presented with fever, weakness, cough and SOB. A CXR was negative except for some atelectases and a possible small right pleural effusion. Labs were significant for WBC low at 2.4 and absolute neutrophils normal at 1.8 K/uL. Renal function was normal. Respiratory panel was positive for influenza A. Room air O2 sat was 85%. He was treated with nebulized albuterol , IV steroids, and Tamiflu . He was felt to be stable for DC the next day. Since going home he has been wearing [employer] oxygen at 2 liters of flow during the day but not at night. Today his sat is 95% after being on room air for 45 minutes. He has regained a little strength, and his cough has almost resolved.    Review of Systems  Constitutional:  Positive for fatigue. Negative for fever.  Respiratory:  Positive for cough, shortness of breath and wheezing.   Cardiovascular: Negative.   Gastrointestinal: Negative.  Negative for abdominal distention.  Genitourinary: Negative.   Neurological: Negative.        Objective:   Physical Exam Constitutional:      Comments: In a wheelchair   Cardiovascular:     Rate and Rhythm: Normal rate. Rhythm irregular.     Pulses: Normal pulses.     Heart sounds: Normal heart sounds.  Pulmonary:     Effort: Pulmonary effort is normal.     Breath sounds: Wheezing present.     Comments: There is increased consolidation in the right middle and lower lung areas  Musculoskeletal:     Right lower leg: No edema.     Left lower leg: No edema.  Neurological:     Mental Status: He is alert and oriented to person, place, and time.           Assessment & Plan:  He is recovering from an influenza infection and an acute exacerbation of COPD. I think he still requires some prn  oxygen for now, but hopefully he ca stop this in the near future. I asked him to obtain an oximeter to follow his saturations at home. Based on his lung exam today I am concerned the pleural effusion may have grown larger so we will get a CXR. I personally spent a total of 35 inutes in the care of the patient today including getting/reviewing separately obtained history, performing a medically appropriate exam/evaluation, placing orders, and independently interpreting results . Garnette Olmsted, MD   "

## 2024-10-10 NOTE — Addendum Note (Signed)
 Addended by: LADONNA INOCENTE SAILOR on: 10/10/2024 03:40 PM   Modules accepted: Orders

## 2024-10-11 ENCOUNTER — Ambulatory Visit: Payer: Self-pay | Admitting: Family Medicine

## 2024-10-11 MED ORDER — AMOXICILLIN-POT CLAVULANATE 875-125 MG PO TABS: 1.0000 | ORAL_TABLET | Freq: Two times a day (BID) | ORAL | 0 refills | Status: AC

## 2024-10-11 NOTE — Addendum Note (Signed)
 Addended by: JOHNNY SENIOR A on: 10/11/2024 08:12 AM   Modules accepted: Orders

## 2024-10-13 ENCOUNTER — Encounter (INDEPENDENT_AMBULATORY_CARE_PROVIDER_SITE_OTHER): Admitting: Ophthalmology

## 2024-10-13 DIAGNOSIS — H35033 Hypertensive retinopathy, bilateral: Secondary | ICD-10-CM | POA: Diagnosis not present

## 2024-10-13 DIAGNOSIS — H43813 Vitreous degeneration, bilateral: Secondary | ICD-10-CM | POA: Diagnosis not present

## 2024-10-13 DIAGNOSIS — D3131 Benign neoplasm of right choroid: Secondary | ICD-10-CM | POA: Diagnosis not present

## 2024-10-13 DIAGNOSIS — I10 Essential (primary) hypertension: Secondary | ICD-10-CM

## 2024-10-13 DIAGNOSIS — H353231 Exudative age-related macular degeneration, bilateral, with active choroidal neovascularization: Secondary | ICD-10-CM | POA: Diagnosis not present

## 2024-10-18 ENCOUNTER — Ambulatory Visit: Admitting: Family Medicine

## 2024-10-18 ENCOUNTER — Encounter: Payer: Self-pay | Admitting: Family Medicine

## 2024-10-18 VITALS — BP 124/80 | HR 52 | Temp 97.3°F | Wt 226.0 lb

## 2024-10-18 DIAGNOSIS — J189 Pneumonia, unspecified organism: Secondary | ICD-10-CM | POA: Diagnosis not present

## 2024-10-18 NOTE — Progress Notes (Signed)
" ° °  Subjective:    Patient ID: Guy Moore, male    DOB: 1934/07/17, 89 y.o.   MRN: [id number]  HPI Here to follow up on a post-influenza pneumonia. He was in the hospital from 09-28-24 to 09-29-24 for influenza A which had caused a COPD exacerbation. We then saw him on 10-10-24 and we heard rales at the right base. A CXR that day revealed a RLL pneumonia, so we started him on 10 days of Augmentin . He now feels much better. His SOB and cough have improved, and he has more energy.    Review of Systems  Constitutional: Negative.   Respiratory:  Positive for cough and shortness of breath. Negative for wheezing.   Cardiovascular: Negative.        Objective:   Physical Exam Constitutional:      Comments: In a wheelchair   Cardiovascular:     Rate and Rhythm: Normal rate and regular rhythm.     Pulses: Normal pulses.     Heart sounds: Normal heart sounds.  Pulmonary:     Effort: Pulmonary effort is normal.     Breath sounds: Wheezing present. No rhonchi or rales.  Neurological:     Mental Status: He is alert.           Assessment & Plan:  His RLL pneumonia has almost resolved. He will finish the Augmentin  and follow up as needed.  Garnette Olmsted, MD   "

## 2024-11-01 ENCOUNTER — Other Ambulatory Visit: Payer: Self-pay | Admitting: Family Medicine

## 2024-11-01 ENCOUNTER — Telehealth: Payer: Self-pay | Admitting: *Deleted

## 2024-11-01 NOTE — Telephone Encounter (Signed)
 Copied from CRM #[id number]. Topic: Clinical - Medical Advice >> Nov 01, 2024  1:42 PM Nessti S wrote: Reason for CRM: pt called to make pcp aware that he was correct and he did not need the oxygen anymore.

## 2024-11-09 ENCOUNTER — Encounter: Payer: Self-pay | Admitting: Family Medicine

## 2024-11-09 NOTE — Telephone Encounter (Signed)
 It sounds like he no longer needs the oxygen. Please call [facility] to cancel the oxygen so they can pick up the equipment

## 2024-11-09 NOTE — Telephone Encounter (Signed)
 Spoke with [facility] agent Nat advised as per Dr Johnny to pick up pt O2 concentrator at pt home. [facility] agent Nat stated that the agent from [facility] will go pick up equipment from pt home tomorrow. Pt notified and voiced understanding

## 2024-11-10 ENCOUNTER — Encounter (INDEPENDENT_AMBULATORY_CARE_PROVIDER_SITE_OTHER): Admitting: Ophthalmology

## 2024-11-10 DIAGNOSIS — H43813 Vitreous degeneration, bilateral: Secondary | ICD-10-CM

## 2024-11-10 DIAGNOSIS — I10 Essential (primary) hypertension: Secondary | ICD-10-CM | POA: Diagnosis not present

## 2024-11-10 DIAGNOSIS — H353231 Exudative age-related macular degeneration, bilateral, with active choroidal neovascularization: Secondary | ICD-10-CM

## 2024-11-10 DIAGNOSIS — H35033 Hypertensive retinopathy, bilateral: Secondary | ICD-10-CM

## 2024-11-10 DIAGNOSIS — D3131 Benign neoplasm of right choroid: Secondary | ICD-10-CM

## 2024-11-13 ENCOUNTER — Ambulatory Visit

## 2024-11-14 LAB — CUP PACEART REMOTE DEVICE CHECK
Battery Remaining Longevity: 76 mo
Battery Voltage: 2.97 V
Brady Statistic AP VP Percent: 0 %
Brady Statistic AP VS Percent: 0 %
Brady Statistic AS VP Percent: 78.59 %
Brady Statistic AS VS Percent: 21.41 %
Brady Statistic RA Percent Paced: 0 %
Brady Statistic RV Percent Paced: 78.59 %
Date Time Interrogation Session: 20260202035923
Implantable Lead Connection Status: 753985
Implantable Lead Connection Status: 753985
Implantable Lead Implant Date: 20180124
Implantable Lead Implant Date: 20180124
Implantable Lead Location: 753859
Implantable Lead Location: 753860
Implantable Lead Model: 5076
Implantable Lead Model: 5076
Implantable Pulse Generator Implant Date: 20180124
Lead Channel Impedance Value: 323 Ohm
Lead Channel Impedance Value: 342 Ohm
Lead Channel Impedance Value: 380 Ohm
Lead Channel Impedance Value: 456 Ohm
Lead Channel Pacing Threshold Amplitude: 0.75 V
Lead Channel Pacing Threshold Pulse Width: 0.4 ms
Lead Channel Sensing Intrinsic Amplitude: 0.25 mV
Lead Channel Sensing Intrinsic Amplitude: 0.25 mV
Lead Channel Sensing Intrinsic Amplitude: 7.5 mV
Lead Channel Sensing Intrinsic Amplitude: 7.5 mV
Lead Channel Setting Pacing Amplitude: 1.5 V
Lead Channel Setting Pacing Pulse Width: 0.4 ms
Lead Channel Setting Sensing Sensitivity: 0.9 mV
Zone Setting Status: 755011

## 2024-11-15 ENCOUNTER — Other Ambulatory Visit (HOSPITAL_COMMUNITY): Payer: Self-pay

## 2024-12-15 ENCOUNTER — Encounter (INDEPENDENT_AMBULATORY_CARE_PROVIDER_SITE_OTHER): Admitting: Ophthalmology

## 2025-01-24 ENCOUNTER — Ambulatory Visit

## 2025-02-12 ENCOUNTER — Ambulatory Visit

## 2025-03-01 ENCOUNTER — Inpatient Hospital Stay: Admitting: Internal Medicine

## 2025-03-01 ENCOUNTER — Inpatient Hospital Stay

## 2025-05-14 ENCOUNTER — Ambulatory Visit

## 2025-10-03 ENCOUNTER — Encounter (INDEPENDENT_AMBULATORY_CARE_PROVIDER_SITE_OTHER): Admitting: Ophthalmology
